# Patient Record
Sex: Male | Born: 1935 | Race: Black or African American | Hispanic: No | Marital: Married | Smoking: Former smoker
Health system: Southern US, Community
[De-identification: ages and names within clinical notes are randomized; demographics above are authoritative.]

## PROBLEM LIST (undated history)

## (undated) DIAGNOSIS — E785 Hyperlipidemia, unspecified: Secondary | ICD-10-CM

## (undated) DIAGNOSIS — I959 Hypotension, unspecified: Secondary | ICD-10-CM

## (undated) DIAGNOSIS — G43909 Migraine, unspecified, not intractable, without status migrainosus: Secondary | ICD-10-CM

## (undated) DIAGNOSIS — K8689 Other specified diseases of pancreas: Secondary | ICD-10-CM

## (undated) DIAGNOSIS — N289 Disorder of kidney and ureter, unspecified: Secondary | ICD-10-CM

## (undated) DIAGNOSIS — E119 Type 2 diabetes mellitus without complications: Secondary | ICD-10-CM

## (undated) DIAGNOSIS — K219 Gastro-esophageal reflux disease without esophagitis: Secondary | ICD-10-CM

## (undated) DIAGNOSIS — I2510 Atherosclerotic heart disease of native coronary artery without angina pectoris: Secondary | ICD-10-CM

## (undated) DIAGNOSIS — I447 Left bundle-branch block, unspecified: Secondary | ICD-10-CM

## (undated) DIAGNOSIS — Z992 Dependence on renal dialysis: Secondary | ICD-10-CM

## (undated) DIAGNOSIS — I509 Heart failure, unspecified: Secondary | ICD-10-CM

## (undated) DIAGNOSIS — N186 End stage renal disease: Secondary | ICD-10-CM

## (undated) DIAGNOSIS — I219 Acute myocardial infarction, unspecified: Secondary | ICD-10-CM

## (undated) DIAGNOSIS — Z8679 Personal history of other diseases of the circulatory system: Secondary | ICD-10-CM

## (undated) DIAGNOSIS — F418 Other specified anxiety disorders: Secondary | ICD-10-CM

## (undated) DIAGNOSIS — G629 Polyneuropathy, unspecified: Secondary | ICD-10-CM

## (undated) DIAGNOSIS — D649 Anemia, unspecified: Secondary | ICD-10-CM

## (undated) DIAGNOSIS — F0390 Unspecified dementia without behavioral disturbance: Secondary | ICD-10-CM

## (undated) DIAGNOSIS — Z89619 Acquired absence of unspecified leg above knee: Secondary | ICD-10-CM

## (undated) DIAGNOSIS — K8020 Calculus of gallbladder without cholecystitis without obstruction: Secondary | ICD-10-CM

## (undated) DIAGNOSIS — M1990 Unspecified osteoarthritis, unspecified site: Secondary | ICD-10-CM

## (undated) DIAGNOSIS — I639 Cerebral infarction, unspecified: Secondary | ICD-10-CM

## (undated) DIAGNOSIS — I10 Essential (primary) hypertension: Secondary | ICD-10-CM

## (undated) HISTORY — DX: Acute myocardial infarction, unspecified: I21.9

## (undated) HISTORY — DX: Calculus of gallbladder without cholecystitis without obstruction: K80.20

## (undated) HISTORY — PX: DG AV DIALYSIS SHUNT INTRO NDL*R* OR: HXRAD812

## (undated) HISTORY — DX: Essential (primary) hypertension: I10

## (undated) HISTORY — DX: Hyperlipidemia, unspecified: E78.5

## (undated) HISTORY — DX: Unspecified osteoarthritis, unspecified site: M19.90

## (undated) HISTORY — DX: Cerebral infarction, unspecified: I63.9

---

## 1998-08-13 ENCOUNTER — Encounter: Payer: Self-pay | Admitting: Internal Medicine

## 1998-08-13 ENCOUNTER — Encounter: Payer: Self-pay | Admitting: Emergency Medicine

## 1998-08-13 ENCOUNTER — Inpatient Hospital Stay (HOSPITAL_COMMUNITY): Admission: EM | Admit: 1998-08-13 | Discharge: 1998-08-25 | Payer: Self-pay | Admitting: Emergency Medicine

## 1998-08-14 ENCOUNTER — Encounter: Payer: Self-pay | Admitting: Internal Medicine

## 1998-08-15 ENCOUNTER — Encounter: Payer: Self-pay | Admitting: Internal Medicine

## 1998-08-16 ENCOUNTER — Encounter: Payer: Self-pay | Admitting: Internal Medicine

## 1998-08-31 ENCOUNTER — Encounter: Admission: RE | Admit: 1998-08-31 | Discharge: 1998-11-29 | Payer: Self-pay | Admitting: Emergency Medicine

## 1998-12-17 ENCOUNTER — Ambulatory Visit (HOSPITAL_COMMUNITY): Admission: RE | Admit: 1998-12-17 | Discharge: 1998-12-17 | Payer: Self-pay | Admitting: Nephrology

## 1998-12-17 ENCOUNTER — Encounter: Payer: Self-pay | Admitting: Nephrology

## 1999-01-25 ENCOUNTER — Encounter: Admission: RE | Admit: 1999-01-25 | Discharge: 1999-04-25 | Payer: Self-pay | Admitting: Internal Medicine

## 1999-04-11 ENCOUNTER — Encounter (HOSPITAL_COMMUNITY): Admission: RE | Admit: 1999-04-11 | Discharge: 1999-07-10 | Payer: Self-pay | Admitting: Nephrology

## 1999-04-25 ENCOUNTER — Encounter: Admission: RE | Admit: 1999-04-25 | Discharge: 1999-07-24 | Payer: Self-pay | Admitting: Orthopedic Surgery

## 1999-09-01 ENCOUNTER — Encounter: Payer: Self-pay | Admitting: Gastroenterology

## 1999-09-01 ENCOUNTER — Ambulatory Visit (HOSPITAL_COMMUNITY): Admission: RE | Admit: 1999-09-01 | Discharge: 1999-09-01 | Payer: Self-pay | Admitting: Gastroenterology

## 1999-09-26 ENCOUNTER — Encounter: Payer: Self-pay | Admitting: Gastroenterology

## 1999-09-26 ENCOUNTER — Ambulatory Visit (HOSPITAL_COMMUNITY): Admission: RE | Admit: 1999-09-26 | Discharge: 1999-09-26 | Payer: Self-pay | Admitting: Gastroenterology

## 2000-02-21 ENCOUNTER — Encounter: Payer: Self-pay | Admitting: Ophthalmology

## 2000-02-21 ENCOUNTER — Ambulatory Visit (HOSPITAL_COMMUNITY): Admission: RE | Admit: 2000-02-21 | Discharge: 2000-02-21 | Payer: Self-pay | Admitting: Ophthalmology

## 2001-05-10 ENCOUNTER — Encounter (HOSPITAL_COMMUNITY): Admission: RE | Admit: 2001-05-10 | Discharge: 2001-08-08 | Payer: Self-pay | Admitting: Nephrology

## 2001-06-12 HISTORY — PX: SP AV DIALYSIS SHUNT ACCESS EXISTING *L*: HXRAD383

## 2001-07-22 ENCOUNTER — Encounter: Payer: Self-pay | Admitting: *Deleted

## 2001-07-22 ENCOUNTER — Ambulatory Visit (HOSPITAL_COMMUNITY): Admission: RE | Admit: 2001-07-22 | Discharge: 2001-07-22 | Payer: Self-pay | Admitting: *Deleted

## 2001-12-04 ENCOUNTER — Ambulatory Visit (HOSPITAL_COMMUNITY): Admission: RE | Admit: 2001-12-04 | Discharge: 2001-12-04 | Payer: Self-pay | Admitting: Nephrology

## 2001-12-04 ENCOUNTER — Encounter: Payer: Self-pay | Admitting: Nephrology

## 2001-12-09 ENCOUNTER — Encounter (HOSPITAL_BASED_OUTPATIENT_CLINIC_OR_DEPARTMENT_OTHER): Admission: RE | Admit: 2001-12-09 | Discharge: 2001-12-27 | Payer: Self-pay | Admitting: Internal Medicine

## 2002-03-14 ENCOUNTER — Ambulatory Visit (HOSPITAL_COMMUNITY): Admission: RE | Admit: 2002-03-14 | Discharge: 2002-03-14 | Payer: Self-pay | Admitting: Vascular Surgery

## 2002-03-24 ENCOUNTER — Encounter (HOSPITAL_BASED_OUTPATIENT_CLINIC_OR_DEPARTMENT_OTHER): Admission: RE | Admit: 2002-03-24 | Discharge: 2002-06-22 | Payer: Self-pay | Admitting: Internal Medicine

## 2002-04-30 ENCOUNTER — Ambulatory Visit (HOSPITAL_COMMUNITY): Admission: RE | Admit: 2002-04-30 | Discharge: 2002-04-30 | Payer: Self-pay | Admitting: Nephrology

## 2002-09-10 ENCOUNTER — Ambulatory Visit (HOSPITAL_COMMUNITY): Admission: RE | Admit: 2002-09-10 | Discharge: 2002-09-10 | Payer: Self-pay | Admitting: Internal Medicine

## 2002-09-10 ENCOUNTER — Encounter (INDEPENDENT_AMBULATORY_CARE_PROVIDER_SITE_OTHER): Payer: Self-pay | Admitting: *Deleted

## 2003-04-16 ENCOUNTER — Inpatient Hospital Stay (HOSPITAL_COMMUNITY): Admission: EM | Admit: 2003-04-16 | Discharge: 2003-04-18 | Payer: Self-pay | Admitting: Emergency Medicine

## 2003-05-13 ENCOUNTER — Encounter (INDEPENDENT_AMBULATORY_CARE_PROVIDER_SITE_OTHER): Payer: Self-pay | Admitting: Cardiology

## 2003-05-13 ENCOUNTER — Ambulatory Visit (HOSPITAL_COMMUNITY): Admission: RE | Admit: 2003-05-13 | Discharge: 2003-05-13 | Payer: Self-pay | Admitting: Nephrology

## 2003-11-25 ENCOUNTER — Inpatient Hospital Stay (HOSPITAL_COMMUNITY): Admission: EM | Admit: 2003-11-25 | Discharge: 2003-11-25 | Payer: Self-pay | Admitting: Emergency Medicine

## 2004-05-30 ENCOUNTER — Ambulatory Visit: Payer: Self-pay | Admitting: Cardiology

## 2004-06-09 ENCOUNTER — Emergency Department (HOSPITAL_COMMUNITY): Admission: EM | Admit: 2004-06-09 | Discharge: 2004-06-09 | Payer: Self-pay | Admitting: Emergency Medicine

## 2004-06-29 ENCOUNTER — Ambulatory Visit: Payer: Self-pay

## 2004-11-15 ENCOUNTER — Emergency Department (HOSPITAL_COMMUNITY): Admission: EM | Admit: 2004-11-15 | Discharge: 2004-11-15 | Payer: Self-pay | Admitting: Emergency Medicine

## 2004-11-28 ENCOUNTER — Encounter (HOSPITAL_BASED_OUTPATIENT_CLINIC_OR_DEPARTMENT_OTHER): Admission: RE | Admit: 2004-11-28 | Discharge: 2005-02-26 | Payer: Self-pay | Admitting: Surgery

## 2005-04-03 ENCOUNTER — Encounter (HOSPITAL_BASED_OUTPATIENT_CLINIC_OR_DEPARTMENT_OTHER): Admission: RE | Admit: 2005-04-03 | Discharge: 2005-04-14 | Payer: Self-pay | Admitting: Surgery

## 2005-06-20 ENCOUNTER — Encounter (HOSPITAL_BASED_OUTPATIENT_CLINIC_OR_DEPARTMENT_OTHER): Admission: RE | Admit: 2005-06-20 | Discharge: 2005-09-12 | Payer: Self-pay | Admitting: Surgery

## 2005-09-20 ENCOUNTER — Encounter (HOSPITAL_BASED_OUTPATIENT_CLINIC_OR_DEPARTMENT_OTHER): Admission: RE | Admit: 2005-09-20 | Discharge: 2005-12-19 | Payer: Self-pay | Admitting: Internal Medicine

## 2006-03-02 ENCOUNTER — Inpatient Hospital Stay (HOSPITAL_COMMUNITY): Admission: EM | Admit: 2006-03-02 | Discharge: 2006-03-08 | Payer: Self-pay | Admitting: Emergency Medicine

## 2006-03-02 ENCOUNTER — Encounter (INDEPENDENT_AMBULATORY_CARE_PROVIDER_SITE_OTHER): Payer: Self-pay | Admitting: *Deleted

## 2006-03-17 ENCOUNTER — Ambulatory Visit (HOSPITAL_COMMUNITY): Admission: RE | Admit: 2006-03-17 | Discharge: 2006-03-17 | Payer: Self-pay | Admitting: Vascular Surgery

## 2006-09-17 ENCOUNTER — Ambulatory Visit (HOSPITAL_COMMUNITY): Admission: RE | Admit: 2006-09-17 | Discharge: 2006-09-17 | Payer: Self-pay | Admitting: Nephrology

## 2007-01-18 ENCOUNTER — Encounter: Admission: RE | Admit: 2007-01-18 | Discharge: 2007-01-18 | Payer: Self-pay | Admitting: Nephrology

## 2007-01-22 ENCOUNTER — Encounter (HOSPITAL_BASED_OUTPATIENT_CLINIC_OR_DEPARTMENT_OTHER): Admission: RE | Admit: 2007-01-22 | Discharge: 2007-03-12 | Payer: Self-pay | Admitting: Internal Medicine

## 2007-02-05 ENCOUNTER — Inpatient Hospital Stay (HOSPITAL_COMMUNITY): Admission: AD | Admit: 2007-02-05 | Discharge: 2007-02-08 | Payer: Self-pay | Admitting: Nephrology

## 2007-02-05 ENCOUNTER — Encounter (INDEPENDENT_AMBULATORY_CARE_PROVIDER_SITE_OTHER): Payer: Self-pay | Admitting: Nephrology

## 2007-02-06 ENCOUNTER — Ambulatory Visit: Payer: Self-pay | Admitting: Surgery

## 2007-03-01 ENCOUNTER — Ambulatory Visit: Payer: Self-pay | Admitting: Vascular Surgery

## 2007-03-11 ENCOUNTER — Ambulatory Visit (HOSPITAL_COMMUNITY): Admission: RE | Admit: 2007-03-11 | Discharge: 2007-03-11 | Payer: Self-pay | Admitting: Vascular Surgery

## 2007-03-11 ENCOUNTER — Ambulatory Visit: Payer: Self-pay | Admitting: Vascular Surgery

## 2007-03-15 ENCOUNTER — Encounter (HOSPITAL_BASED_OUTPATIENT_CLINIC_OR_DEPARTMENT_OTHER): Admission: RE | Admit: 2007-03-15 | Discharge: 2007-06-13 | Payer: Self-pay | Admitting: Surgery

## 2007-07-16 ENCOUNTER — Encounter (HOSPITAL_BASED_OUTPATIENT_CLINIC_OR_DEPARTMENT_OTHER): Admission: RE | Admit: 2007-07-16 | Discharge: 2007-10-14 | Payer: Self-pay | Admitting: Internal Medicine

## 2007-10-07 ENCOUNTER — Ambulatory Visit: Payer: Self-pay | Admitting: Cardiology

## 2007-10-11 ENCOUNTER — Ambulatory Visit: Payer: Self-pay

## 2007-10-21 ENCOUNTER — Encounter (HOSPITAL_BASED_OUTPATIENT_CLINIC_OR_DEPARTMENT_OTHER): Admission: RE | Admit: 2007-10-21 | Discharge: 2008-01-16 | Payer: Self-pay | Admitting: Surgery

## 2008-01-17 ENCOUNTER — Encounter (HOSPITAL_BASED_OUTPATIENT_CLINIC_OR_DEPARTMENT_OTHER): Admission: RE | Admit: 2008-01-17 | Discharge: 2008-03-10 | Payer: Self-pay | Admitting: Internal Medicine

## 2008-03-10 ENCOUNTER — Encounter (HOSPITAL_BASED_OUTPATIENT_CLINIC_OR_DEPARTMENT_OTHER): Admission: RE | Admit: 2008-03-10 | Discharge: 2008-06-02 | Payer: Self-pay | Admitting: Internal Medicine

## 2008-06-18 ENCOUNTER — Encounter (HOSPITAL_BASED_OUTPATIENT_CLINIC_OR_DEPARTMENT_OTHER): Admission: RE | Admit: 2008-06-18 | Discharge: 2008-09-16 | Payer: Self-pay | Admitting: Internal Medicine

## 2008-06-19 ENCOUNTER — Encounter (INDEPENDENT_AMBULATORY_CARE_PROVIDER_SITE_OTHER): Payer: Self-pay | Admitting: General Surgery

## 2008-06-19 ENCOUNTER — Inpatient Hospital Stay (HOSPITAL_COMMUNITY): Admission: RE | Admit: 2008-06-19 | Discharge: 2008-06-23 | Payer: Self-pay | Admitting: General Surgery

## 2008-09-08 ENCOUNTER — Ambulatory Visit (HOSPITAL_COMMUNITY): Admission: RE | Admit: 2008-09-08 | Discharge: 2008-09-08 | Payer: Self-pay | Admitting: Nephrology

## 2008-09-16 ENCOUNTER — Encounter (HOSPITAL_BASED_OUTPATIENT_CLINIC_OR_DEPARTMENT_OTHER): Admission: RE | Admit: 2008-09-16 | Discharge: 2008-11-13 | Payer: Self-pay | Admitting: Internal Medicine

## 2009-01-15 ENCOUNTER — Ambulatory Visit: Payer: Self-pay | Admitting: Internal Medicine

## 2009-01-15 ENCOUNTER — Encounter: Payer: Self-pay | Admitting: Internal Medicine

## 2009-01-15 DIAGNOSIS — E785 Hyperlipidemia, unspecified: Secondary | ICD-10-CM

## 2009-01-15 DIAGNOSIS — Z8679 Personal history of other diseases of the circulatory system: Secondary | ICD-10-CM

## 2009-01-15 DIAGNOSIS — F411 Generalized anxiety disorder: Secondary | ICD-10-CM

## 2009-01-15 DIAGNOSIS — N186 End stage renal disease: Secondary | ICD-10-CM

## 2009-01-15 DIAGNOSIS — Z992 Dependence on renal dialysis: Secondary | ICD-10-CM

## 2009-01-15 DIAGNOSIS — I219 Acute myocardial infarction, unspecified: Secondary | ICD-10-CM | POA: Insufficient documentation

## 2009-01-15 DIAGNOSIS — R05 Cough: Secondary | ICD-10-CM

## 2009-01-15 DIAGNOSIS — I10 Essential (primary) hypertension: Secondary | ICD-10-CM | POA: Insufficient documentation

## 2009-01-18 ENCOUNTER — Ambulatory Visit: Payer: Self-pay | Admitting: Cardiology

## 2009-01-29 ENCOUNTER — Ambulatory Visit: Payer: Self-pay | Admitting: Internal Medicine

## 2009-01-29 DIAGNOSIS — R519 Headache, unspecified: Secondary | ICD-10-CM | POA: Insufficient documentation

## 2009-01-29 DIAGNOSIS — R51 Headache: Secondary | ICD-10-CM

## 2009-03-24 ENCOUNTER — Encounter (HOSPITAL_BASED_OUTPATIENT_CLINIC_OR_DEPARTMENT_OTHER): Admission: RE | Admit: 2009-03-24 | Discharge: 2009-06-07 | Payer: Self-pay | Admitting: General Surgery

## 2009-04-01 ENCOUNTER — Ambulatory Visit: Admission: RE | Admit: 2009-04-01 | Discharge: 2009-04-01 | Payer: Self-pay | Admitting: Internal Medicine

## 2009-04-01 ENCOUNTER — Ambulatory Visit: Payer: Self-pay | Admitting: Surgery

## 2009-04-01 ENCOUNTER — Encounter (HOSPITAL_BASED_OUTPATIENT_CLINIC_OR_DEPARTMENT_OTHER): Payer: Self-pay | Admitting: Internal Medicine

## 2009-06-12 HISTORY — PX: PARATHYROIDECTOMY: SHX19

## 2009-06-12 HISTORY — PX: COLON SURGERY: SHX602

## 2009-06-27 ENCOUNTER — Inpatient Hospital Stay (HOSPITAL_COMMUNITY): Admission: EM | Admit: 2009-06-27 | Discharge: 2009-07-03 | Payer: Self-pay | Admitting: Interventional Cardiology

## 2009-06-27 ENCOUNTER — Encounter: Payer: Self-pay | Admitting: Emergency Medicine

## 2009-06-30 ENCOUNTER — Ambulatory Visit: Payer: Self-pay | Admitting: Vascular Surgery

## 2009-09-08 ENCOUNTER — Ambulatory Visit: Payer: Self-pay | Admitting: Vascular Surgery

## 2009-09-30 ENCOUNTER — Ambulatory Visit: Payer: Self-pay | Admitting: Vascular Surgery

## 2009-10-07 ENCOUNTER — Ambulatory Visit: Payer: Self-pay | Admitting: Vascular Surgery

## 2009-10-29 ENCOUNTER — Emergency Department (HOSPITAL_COMMUNITY): Admission: EM | Admit: 2009-10-29 | Discharge: 2009-10-29 | Payer: Self-pay | Admitting: Family Medicine

## 2010-01-20 ENCOUNTER — Ambulatory Visit: Payer: Self-pay | Admitting: Vascular Surgery

## 2010-03-03 ENCOUNTER — Encounter (HOSPITAL_BASED_OUTPATIENT_CLINIC_OR_DEPARTMENT_OTHER)
Admission: RE | Admit: 2010-03-03 | Discharge: 2010-06-01 | Payer: Self-pay | Source: Home / Self Care | Attending: General Surgery | Admitting: General Surgery

## 2010-07-03 ENCOUNTER — Encounter: Payer: Self-pay | Admitting: General Surgery

## 2010-07-03 ENCOUNTER — Encounter: Payer: Self-pay | Admitting: Nephrology

## 2010-08-28 LAB — CBC
HCT: 32.3 % — ABNORMAL LOW (ref 39.0–52.0)
HCT: 34.1 % — ABNORMAL LOW (ref 39.0–52.0)
HCT: 36.7 % — ABNORMAL LOW (ref 39.0–52.0)
Hemoglobin: 11.3 g/dL — ABNORMAL LOW (ref 13.0–17.0)
Hemoglobin: 12.4 g/dL — ABNORMAL LOW (ref 13.0–17.0)
MCHC: 33.7 g/dL (ref 30.0–36.0)
MCHC: 33.8 g/dL (ref 30.0–36.0)
MCHC: 32.8 g/dL (ref 30.0–36.0)
MCV: 85.5 fL (ref 78.0–100.0)
MCV: 86.1 fL (ref 78.0–100.0)
MCV: 86.8 fL (ref 78.0–100.0)
Platelets: 175 10*3/uL (ref 150–400)
Platelets: 187 10*3/uL (ref 150–400)
RBC: 3.22 MIL/uL — ABNORMAL LOW (ref 4.22–5.81)
RBC: 3.98 MIL/uL — ABNORMAL LOW (ref 4.22–5.81)
RBC: 4.47 MIL/uL (ref 4.22–5.81)
RDW: 16.8 % — ABNORMAL HIGH (ref 11.5–15.5)
RDW: 17 % — ABNORMAL HIGH (ref 11.5–15.5)
RDW: 17.1 % — ABNORMAL HIGH (ref 11.5–15.5)
WBC: 4.4 10*3/uL (ref 4.0–10.5)
WBC: 4.9 10*3/uL (ref 4.0–10.5)
WBC: 4.9 10*3/uL (ref 4.0–10.5)
WBC: 5.9 10*3/uL (ref 4.0–10.5)

## 2010-08-28 LAB — COMPREHENSIVE METABOLIC PANEL
ALT: 18 U/L (ref 0–53)
AST: 18 U/L (ref 0–37)
Albumin: 2.9 g/dL — ABNORMAL LOW (ref 3.5–5.2)
Alkaline Phosphatase: 62 U/L (ref 39–117)
BUN: 98 mg/dL — ABNORMAL HIGH (ref 6–23)
CO2: 26 mEq/L (ref 19–32)
Calcium: 8.8 mg/dL (ref 8.4–10.5)
Chloride: 94 mEq/L — ABNORMAL LOW (ref 96–112)
GFR calc Af Amer: 6 mL/min — ABNORMAL LOW (ref >60)
Glucose, Bld: 95 mg/dL (ref 70–99)
Potassium: 6.1 mEq/L — ABNORMAL HIGH (ref 3.5–5.1)
Potassium: 5.4 mEq/L — ABNORMAL HIGH (ref 3.5–5.1)
Sodium: 135 mEq/L (ref 135–145)
Total Bilirubin: 0.8 mg/dL (ref 0.3–1.2)
Total Protein: 7.7 g/dL (ref 6.0–8.3)

## 2010-08-28 LAB — BASIC METABOLIC PANEL
BUN: 45 mg/dL — ABNORMAL HIGH (ref 6–23)
CO2: 23 mEq/L (ref 19–32)
CO2: 32 mEq/L (ref 19–32)
Calcium: 8.1 mg/dL — ABNORMAL LOW (ref 8.4–10.5)
Calcium: 8.2 mg/dL — ABNORMAL LOW (ref 8.4–10.5)
Calcium: 8.5 mg/dL (ref 8.4–10.5)
Creatinine, Ser: 6.94 mg/dL — ABNORMAL HIGH (ref 0.4–1.5)
Creatinine, Ser: 9.76 mg/dL — ABNORMAL HIGH (ref 0.4–1.5)
GFR calc Af Amer: 10 mL/min — ABNORMAL LOW (ref >60)
GFR calc Af Amer: 5 mL/min — ABNORMAL LOW (ref >60)
GFR calc non Af Amer: 5 mL/min — ABNORMAL LOW (ref >60)
GFR calc non Af Amer: 5 mL/min — ABNORMAL LOW (ref >60)
GFR calc non Af Amer: 8 mL/min — ABNORMAL LOW (ref >60)
Glucose, Bld: 142 mg/dL — ABNORMAL HIGH (ref 70–99)
Potassium: 4.5 mEq/L (ref 3.5–5.1)
Potassium: 5.2 mEq/L — ABNORMAL HIGH (ref 3.5–5.1)
Sodium: 133 mEq/L — ABNORMAL LOW (ref 135–145)

## 2010-08-28 LAB — RENAL FUNCTION PANEL
Albumin: 3 g/dL — ABNORMAL LOW (ref 3.5–5.2)
Albumin: 3.2 g/dL — ABNORMAL LOW (ref 3.5–5.2)
BUN: 30 mg/dL — ABNORMAL HIGH (ref 6–23)
BUN: 54 mg/dL — ABNORMAL HIGH (ref 6–23)
Calcium: 8.1 mg/dL — ABNORMAL LOW (ref 8.4–10.5)
Calcium: 8.4 mg/dL (ref 8.4–10.5)
Creatinine, Ser: 7.07 mg/dL — ABNORMAL HIGH (ref 0.4–1.5)
GFR calc Af Amer: 9 mL/min — ABNORMAL LOW (ref >60)
GFR calc non Af Amer: 8 mL/min — ABNORMAL LOW (ref >60)
Phosphorus: 5.7 mg/dL — ABNORMAL HIGH (ref 2.3–4.6)
Phosphorus: 6.9 mg/dL — ABNORMAL HIGH (ref 2.3–4.6)
Potassium: 4.9 mEq/L (ref 3.5–5.1)

## 2010-08-28 LAB — DIFFERENTIAL
Eosinophils Absolute: 0 10*3/uL (ref 0.0–0.7)
Eosinophils Relative: 1 % (ref 0–5)
Lymphs Abs: 0.9 10*3/uL (ref 0.7–4.0)
Monocytes Relative: 12 % (ref 3–12)

## 2010-08-28 LAB — CK TOTAL AND CKMB (NOT AT ARMC)
CK, MB: 8.8 ng/mL (ref 0.3–4.0)
CK, MB: 6.4 ng/mL (ref 0.3–4.0)
Relative Index: 3.3 — ABNORMAL HIGH (ref 0.0–2.5)
Relative Index: 3.4 — ABNORMAL HIGH (ref 0.0–2.5)
Relative Index: 2.4 (ref 0.0–2.5)

## 2010-08-28 LAB — TROPONIN I
Troponin I: 0.86 ng/mL (ref 0.00–0.06)
Troponin I: 0.12 ng/mL — ABNORMAL HIGH (ref 0.00–0.06)

## 2010-08-28 LAB — LIPID PANEL: Triglycerides: 39 mg/dL (ref <150)

## 2010-08-28 LAB — GLUCOSE, CAPILLARY: Glucose-Capillary: 85 mg/dL (ref 70–99)

## 2010-08-28 LAB — ALBUMIN: Albumin: 2.7 g/dL — ABNORMAL LOW (ref 3.5–5.2)

## 2010-08-28 LAB — HEPATITIS B SURFACE ANTIGEN: Hepatitis B Surface Ag: NEGATIVE

## 2010-08-28 LAB — APTT: aPTT: 33 seconds (ref 24–37)

## 2010-08-28 LAB — PHOSPHORUS: Phosphorus: 9.8 mg/dL (ref 2.3–4.6)

## 2010-09-15 LAB — GLUCOSE, CAPILLARY: Glucose-Capillary: 129 mg/dL — ABNORMAL HIGH (ref 70–99)

## 2010-09-16 ENCOUNTER — Other Ambulatory Visit (HOSPITAL_COMMUNITY): Payer: Self-pay | Admitting: Nephrology

## 2010-09-16 DIAGNOSIS — Q892 Congenital malformations of other endocrine glands: Secondary | ICD-10-CM

## 2010-09-22 LAB — GLUCOSE, CAPILLARY
Glucose-Capillary: 170 mg/dL — ABNORMAL HIGH (ref 70–99)
Glucose-Capillary: 90 mg/dL (ref 70–99)

## 2010-09-26 LAB — GLUCOSE, CAPILLARY
Glucose-Capillary: 104 mg/dL — ABNORMAL HIGH (ref 70–99)
Glucose-Capillary: 106 mg/dL — ABNORMAL HIGH (ref 70–99)
Glucose-Capillary: 106 mg/dL — ABNORMAL HIGH (ref 70–99)
Glucose-Capillary: 127 mg/dL — ABNORMAL HIGH (ref 70–99)
Glucose-Capillary: 127 mg/dL — ABNORMAL HIGH (ref 70–99)
Glucose-Capillary: 143 mg/dL — ABNORMAL HIGH (ref 70–99)
Glucose-Capillary: 173 mg/dL — ABNORMAL HIGH (ref 70–99)
Glucose-Capillary: 93 mg/dL (ref 70–99)
Glucose-Capillary: 97 mg/dL (ref 70–99)

## 2010-09-26 LAB — RENAL FUNCTION PANEL
CO2: 24 mEq/L (ref 19–32)
Calcium: 8 mg/dL — ABNORMAL LOW (ref 8.4–10.5)
Chloride: 91 mEq/L — ABNORMAL LOW (ref 96–112)
GFR calc Af Amer: 6 mL/min — ABNORMAL LOW (ref >60)
GFR calc non Af Amer: 5 mL/min — ABNORMAL LOW (ref >60)
Glucose, Bld: 92 mg/dL (ref 70–99)
Potassium: 5.1 mEq/L (ref 3.5–5.1)
Sodium: 128 mEq/L — ABNORMAL LOW (ref 135–145)

## 2010-09-26 LAB — COMPREHENSIVE METABOLIC PANEL
Alkaline Phosphatase: 277 U/L — ABNORMAL HIGH (ref 39–117)
BUN: 39 mg/dL — ABNORMAL HIGH (ref 6–23)
Glucose, Bld: 103 mg/dL — ABNORMAL HIGH (ref 70–99)
Potassium: 4.8 mEq/L (ref 3.5–5.1)
Total Protein: 8 g/dL (ref 6.0–8.3)

## 2010-09-26 LAB — DIFFERENTIAL
Basophils Absolute: 0 10*3/uL (ref 0.0–0.1)
Basophils Relative: 0 % (ref 0–1)
Monocytes Relative: 15 % — ABNORMAL HIGH (ref 3–12)
Neutro Abs: 3.4 10*3/uL (ref 1.7–7.7)
Neutrophils Relative %: 60 % (ref 43–77)

## 2010-09-26 LAB — CALCIUM
Calcium: 7.4 mg/dL — ABNORMAL LOW (ref 8.4–10.5)
Calcium: 7.4 mg/dL — ABNORMAL LOW (ref 8.4–10.5)
Calcium: 7.5 mg/dL — ABNORMAL LOW (ref 8.4–10.5)
Calcium: 7.7 mg/dL — ABNORMAL LOW (ref 8.4–10.5)
Calcium: 8 mg/dL — ABNORMAL LOW (ref 8.4–10.5)
Calcium: 9.4 mg/dL (ref 8.4–10.5)

## 2010-09-26 LAB — BASIC METABOLIC PANEL
BUN: 37 mg/dL — ABNORMAL HIGH (ref 6–23)
BUN: 48 mg/dL — ABNORMAL HIGH (ref 6–23)
CO2: 24 mEq/L (ref 19–32)
Calcium: 7.6 mg/dL — ABNORMAL LOW (ref 8.4–10.5)
Chloride: 93 mEq/L — ABNORMAL LOW (ref 96–112)
Chloride: 96 mEq/L (ref 96–112)
GFR calc non Af Amer: 7 mL/min — ABNORMAL LOW (ref >60)
Glucose, Bld: 124 mg/dL — ABNORMAL HIGH (ref 70–99)
Glucose, Bld: 143 mg/dL — ABNORMAL HIGH (ref 70–99)
Glucose, Bld: 145 mg/dL — ABNORMAL HIGH (ref 70–99)
Potassium: 4.6 mEq/L (ref 3.5–5.1)
Potassium: 5.2 mEq/L — ABNORMAL HIGH (ref 3.5–5.1)
Potassium: 6.2 mEq/L — ABNORMAL HIGH (ref 3.5–5.1)
Sodium: 134 mEq/L — ABNORMAL LOW (ref 135–145)

## 2010-09-26 LAB — CBC
HCT: 37.4 % — ABNORMAL LOW (ref 39.0–52.0)
HCT: 42.4 % (ref 39.0–52.0)
Hemoglobin: 11.3 g/dL — ABNORMAL LOW (ref 13.0–17.0)
Hemoglobin: 12.1 g/dL — ABNORMAL LOW (ref 13.0–17.0)
Hemoglobin: 13.7 g/dL (ref 13.0–17.0)
MCHC: 32.2 g/dL (ref 30.0–36.0)
MCHC: 32.3 g/dL (ref 30.0–36.0)
MCV: 83.4 fL (ref 78.0–100.0)
MCV: 83.7 fL (ref 78.0–100.0)
Platelets: 192 10*3/uL (ref 150–400)
RBC: 4.18 MIL/uL — ABNORMAL LOW (ref 4.22–5.81)
RDW: 18.2 % — ABNORMAL HIGH (ref 11.5–15.5)
RDW: 18.4 % — ABNORMAL HIGH (ref 11.5–15.5)
WBC: 5.6 10*3/uL (ref 4.0–10.5)

## 2010-09-26 LAB — PHOSPHORUS
Phosphorus: 3.6 mg/dL (ref 2.3–4.6)
Phosphorus: 3.9 mg/dL (ref 2.3–4.6)
Phosphorus: 4.2 mg/dL (ref 2.3–4.6)
Phosphorus: 4.4 mg/dL (ref 2.3–4.6)

## 2010-09-26 LAB — POTASSIUM: Potassium: 4.9 mEq/L (ref 3.5–5.1)

## 2010-09-27 ENCOUNTER — Encounter (HOSPITAL_COMMUNITY)
Admission: RE | Admit: 2010-09-27 | Discharge: 2010-09-27 | Disposition: A | Discharge status: Home / Self Care | Payer: Medicare Other | Source: Ambulatory Visit | Attending: Nephrology | Admitting: Nephrology

## 2010-09-27 ENCOUNTER — Encounter (HOSPITAL_COMMUNITY): Payer: Self-pay

## 2010-09-27 ENCOUNTER — Ambulatory Visit (HOSPITAL_COMMUNITY): Payer: Self-pay

## 2010-09-27 DIAGNOSIS — Q892 Congenital malformations of other endocrine glands: Secondary | ICD-10-CM

## 2010-09-27 DIAGNOSIS — E213 Hyperparathyroidism, unspecified: Secondary | ICD-10-CM | POA: Insufficient documentation

## 2010-09-27 DIAGNOSIS — N189 Chronic kidney disease, unspecified: Secondary | ICD-10-CM | POA: Insufficient documentation

## 2010-09-27 MED ORDER — TECHNETIUM TC 99M SESTAMIBI - CARDIOLITE
25.0000 | Freq: Once | INTRAVENOUS | Status: AC | PRN
  Administered 2010-09-27: 10:00:00 25 via INTRAVENOUS

## 2010-10-25 NOTE — Group Therapy Note (Signed)
NAME:  Sean [name], PULEO NO.:  [id number]   MEDICAL RECORD NO.:  [id number]           PATIENT TYPE:   LOCATION:                                 FACILITY:   PHYSICIAN:  Lenon Curt. Chilton Si, M.D.  DATE OF BIRTH:  Nov 08, 1935                                 PROGRESS NOTE   HISTORY:  A 75 year old male returning for recheck of venous stasis ulcer of the  left lower anterior leg and also with a small ulceration of the right  anterior lower leg.  These wounds are doing better.  There is some  tenderness and discomfort.  That is scabbed over at the present time on  multiple scars of the lower legs.   PHYSICAL EXAMINATION:  Temperature 97.7, pulse 99, respirations 16, blood pressure 103/69.  Last capillary glucose 163 mg%.   Wound measurements:  Wound #10, left anterior lower extremity, now 0.5 x 0.8 x approximately  0.1 cm depth.  Wound #13, right anterior upper extremity, now 0.6 x 0.8 x 0.1 cm.  Both  wounds are scabbed over with a tenacious scab.  There is healing from  the edge.  There is tenderness on palpation.  There is no purulence and  no surrounding erythema suggesting infection.   TREATMENT:  Wounds have progressed to a point now that I do not think the Profore  dressing is necessary any more in the left leg, and we have applied  Polysporin and bandages to both wounds.  Because of the tenacious scab  that is present, I felt that he should return to clinic for 1 more  visit.  He is to call if the wounds appear to worsen, otherwise, return  to clinic in 3 weeks.  He is advised that he may bathe.   ICD-9 code, 454.0.  CPT code, 16109.      Lenon Curt Chilton Si, M.D.  Electronically Signed     AGG/MEDQ  D:  05/29/2008  T:  05/29/2008  Job:  [id number]

## 2010-10-25 NOTE — Op Note (Signed)
NAME:  Sean [name], Sean [name]             ACCOUNT NO.:  [id number]   MEDICAL RECORD NO.:  [id number]          PATIENT TYPE:  AMB   LOCATION:  SDS                          FACILITY:  [facility]   PHYSICIAN:  Larina Earthly, M.D.    DATE OF BIRTH:  08/20/1935   DATE OF PROCEDURE:  03/11/2007  DATE OF DISCHARGE:                               OPERATIVE REPORT   PREOPERATIVE DIAGNOSIS:  End-stage renal disease with aneurysmal  degeneration of venous limb of left forearm loop AV Gore-Tex graft.   POSTOPERATIVE DIAGNOSIS:  End-stage renal disease with aneurysmal  degeneration of venous limb of left forearm loop AV Gore-Tex graft.   PROCEDURES:  Replacement of venous limb of left forearm loop AV Gore-Tex  graft.   SURGEON:  Larina Earthly, M.D.   ASSISTANT:  Nurse.   ANESTHESIA:  MAC.   COMPLICATIONS:  None.   DISPOSITION:  To recovery room stable.   PROCEDURE IN DETAIL:  The patient was taken to the operating room and  placed in supine position where the left arm was prepped and draped in  usual sterile fashion.  Incision was made using local anesthesia over  the distal loop of the graft, and the graft was exposed in this area.  A  separate incision was made using local anesthesia at the antecubital  space over the venous limb of the anastomosis.  The graft was isolated  at this area as well.  A tunnel was created between the distal loop of  the graft and the antecubital space.  The patient was given 5000 units  of intravenous heparin.  After adequate circulation time, the graft was  occluded at the antecubital space and also at the distal loop of the  graft.  The graft was divided at the distal loop, and the new 6-mm  standard wall graft was sewn end-to-end to the graft at this position.  The graft was flushed with heparinized saline and reoccluded.  Next, the  graft was brought into approximation with the graft near the venous  anastomosis at the antecubital space, and the graft was divided  at this  level as well.  The graft cut to appropriate length and sewn end-to-end  to the old graft with a running 6-0 Prolene suture.  Clamps were  removed, and a thrill was noted.  The wound were irrigated with saline.  Hemostasis achieved with cautery.  Wounds were closed with 3-0 Vicryl in  the subcutaneous and subcuticular tissue.  Benzoin and Steri-Strips were  applied.      Larina Earthly, M.D.  Electronically Signed    TFE/MEDQ  D:  03/11/2007  T:  03/11/2007  Job:  [id number]

## 2010-10-25 NOTE — Assessment & Plan Note (Signed)
[facility]   NAME:  [name], [name]             ACCOUNT NO.:  [id number]   MEDICAL RECORD NO.:  [id number]      DATE OF BIRTH:  [birth year]   PHYSICIAN:  Jake Shark A. Tanda Rockers, M.D. VISIT DATE:  04/29/2007                                   OFFICE VISIT   SUBJECTIVE:  Sean [name] is a 75 year old man whom we are following  for calciphylaxis.  In the interim he has continued to use antiseptic  soap wash daily, Iodosorb, and a dry dressing.  He reports that there is  been no increase in pain, drainage, or ulceration.  He continues to be  ambulatory.  He is tolerating his dialysis treatments without  difficulty.   OBJECTIVE:  Blood pressure is 149/89, respirations 18, pulse rate 83,  temperature is 97.9.  Capillary blood glucose is 129 mg percent.  Inspection of the left posterior leg shows that the wounds continue to  contract.  There is healthy, well-adherent eschar over a minuscule area.  There is no malodor.  There is no evidence of active infection or  drainage.   IMPRESSION:  Resolved calciphylaxis.   PLAN:  We are discharging the patient.  We have instructed him to  continue the topical application of the Iodosorb on a p.r.n. basis.  We  have expressed a willingness to reevaluate him in the presence of a  recurrence.  The patient seems to understand.  He expresses gratitude  for having been seen in the clinic and indicates that he will be  compliant.      Harold A. Tanda Rockers, M.D.  Electronically Signed     HAN/MEDQ  D:  04/29/2007  T:  04/29/2007  Job:  [id number]   cc:   Terrial Rhodes, M.D.

## 2010-10-25 NOTE — Assessment & Plan Note (Signed)
[facility]   NAME:  Sean [name], Sean [name]             ACCOUNT NO.:  [id number]   MEDICAL RECORD NO.:  [id number]      DATE OF BIRTH:  1935/12/01   PHYSICIAN:  Jonelle Sports. Sevier, M.D.  VISIT DATE:  12/04/2007                                   OFFICE VISIT   HISTORY:  This 75 year old black male, chronic dialysis patient has been  plagued with calciphylaxis and painful lesions on the lower extremities  for some time.  The right lower extremity has healed but he has had  persistent open ulcerations and full-thickness wounds on the left lower  extremity.  These were treated with application of Iodosorb and the use  of a compressive wrap with some success initially, but recently the  patient has noted that the wraps themselves of generated pain and he has  been in the habit of taking them off before due for his return visit.  He admittedly since his last visit here, when he was placed in a Profore  wrap, he removed it within 48 hours after this visit.   He reports that his overall pain is somewhat better in that extremity  now probably at a 2-3 level on a scale of 10 and as opposed to previous  level of 8 or 9.  He reports no fever, chills, or other symptoms of that  nature, is unaware of any drainage and any odor from his lesions.   EXAMINATION:  Blood pressure is 132/69, pulse 91, respirations 18, and  temperature 99 degrees.  The wounds on the left lower extremity are  primarily anteromedial, anterior, and anterolateral and are nicely  summarized by the nurse on the wound sheet.  He has approximately 65 to  70 square centimeter of wound area and most of wounds now covered with a  semi-necrotic eschar.  He has no active drainage, no spreading  inflammation.   IMPRESSION:  Satisfactory course in place of dialysis related  calciphylaxis with ulcerations.   DISPOSITION:  No debridement is attempted today, all of these wounds are  largely covered with eschar.  Everything  is clean and seems little  advantage in changing that at this point.   The patient will be today dressed with an application of triamcinolone  ointment on simply the wound itself, not to adjacent more normal skin.  It will then be covered with a bulky wrap and this will be held in place  with a stockinette wrap.   The patient will return weekly to the nurse for dressing changes and 3-4  weeks to the physician for reevaluation.           ______________________________  Jonelle Sports Cheryll Cockayne, M.D.     RES/MEDQ  D:  12/04/2007  T:  12/05/2007  Job:  [id number]

## 2010-10-25 NOTE — Discharge Summary (Signed)
NAME:  Sean [name], Sean [name] NO.:  [id number]   MEDICAL RECORD NO.:  [id number]          PATIENT TYPE:  INP   LOCATION:  5528                         FACILITY:  [facility]   PHYSICIAN:  Rufina Falco, M.D.     DATE OF BIRTH:  Jan 29, 1936   DATE OF ADMISSION:  02/05/2007  DATE OF DISCHARGE:  02/08/2007                               DISCHARGE SUMMARY   DISCHARGE DIAGNOSES:  1. End-stage renal disease.  2. Fever.  3. Left lower extremity calciphylaxis.  4. Diabetes mellitus.  5. Abdominal pain.  6. Anxiety.  7. Hyperlipidemia.   DISCHARGE MEDICATIONS:  1. Fortaz 1 g IV with each hemodialysis x2 weeks.  2. Vancomycin 1 g IV with each hemodialysis for two weeks.  3. Fosrenol 500 mg four tablets with each meal.  4. Loratadine 10 mg p.o. daily.  5. Nepro one can t.i.d.  6. Paxil 20 mg p.o. daily.  7. Actos 15 mg p.o. daily.  8. Nephro-Vite one pill p.o. daily.  9. Xanax 0.25 mg p.o. q.8 h.  10.Vicodin 5/500 mg 1-2 pills p.o. q.6 h. p.r.n. pain.  11.Phenergan 25 mg p.o. q.8 h. p.r.n. nausea.  12.Ambien 5 mg p.o. q.h.s. p.r.n. insomnia.  13.The patient is just stopping Zemplar.   DISPOSITION/FOLLOWUP:  The patient is to continue his hemodialysis  schedule and to follow up with the hemodialysis doctors.   PROCEDURES PERFORMED:  1. Patient had a chest x-ray on February 05, 2007 which showed      suboptimal inspiration, stable cardiomegaly, atelectasis and/or      pneumonia in the lung bases.  No evidence of pulmonary edema.  The      patient also had x-ray of the abdomen on February 05, 2007 which      showed multiple radiopaque structures in the right upper quadrant      of the abdomen.  These could be related to the gallbladder,      although the distribution suggests that they may be __________      structures within the bowel.  This could be better evaluated with      CT.  2. Nonspecific bowel gas pattern.  The patient had a chest x-ray on      February 06, 2007.   IMPRESSION:  1. Cardiomegaly with pulmonary vascular prominence similar in      appearance to prior exam.  2. Tortuous aorta.  CT of the abdomen on February 06, 2007.   IMPRESSION:  1. High density material within his stomach and ascending colon.  It      is compatible with ingested tablets.  Likely, the patient is      __________ .  2. Atrophic kidneys bilaterally compatible with end-stage renal      disease.  3. Atherosclerosis.  The patient had a chest x-ray on February 07, 2007      which showed:      a.     Cardiomegaly with mild pulmonary venous congestion.      b.     Bibasilar atelectasis.  The patient had a CT of the left  lower extremity on February 07, 2007.   IMPRESSION:  1. Faintly reduced intramuscular fat stranding in the distal      __________ musculature or in the tibialis anterior.  I cannot      exclude intramuscular edema or myositis.  However, no discrete      findings of abscess are identified, and there is no gas in the soft      tissues.  MRI is more sensitive for assessing for intramuscular      edema if clinically warranted.  2. Venous varices are present immediately in the leg.  3. Atherosclerotic calcification is present.  Of note the patient      refused to have MRI evaluation of the lower extremities to rule out      osteomyelitis and abscess.  The patient also had arterial Dopplers      bilaterally.  ABI could not be ascertained  due to incompressible      vesicles probably secondary to calcification.  However, Dopplers      were normal.  Triphasic throughout the pedal vessels.  This was      done on February 06, 2007.   CONSULTATIONS:  Wound care consult.  Brief H&P is as follows.   CHIEF COMPLAINT:  Fever.   HISTORY OF PRESENT ILLNESS:  The patient is a 75 year old African-  American man with past medical history significant for ESRD with  hemodialysis on Tuesdays, Thursdays, and Saturdays at [facility], hypertension, diabetes  mellitus, calciphylaxis,  hypertension, and gout, who [facility] to having  fevers during hemodialysis.  The patient reports having fever, decreased  p.o. intake, and abdominal tightness x2-3 days.  The patient describes  abdomen as bloated and tight.  He complains of diarrhea on Sunday with 4-  5 episodes of not bloody or dark stools.  The patient also complained of  cough and white sputum x1 week.  He reported left lower extremity pain  secondary to his calciphylaxis. Wound care has been following.  The  patient also reported wound care was following him for his left big toe  which appears darker now, but it is not painful.   For further information on Past Medical History, Allergies, Past  Surgical History, Medications, Family History, Social History, please  see hospital chart.   PHYSICAL EXAMINATION:  VITALS:  Pulse 128, blood pressure 135/64, O2  sats 89% on room air, temperature 103.1.  GENERAL:  In no acute distress.  HEENT:  AT, [place], moist oral mucosa.  CV:  S1, S2, tachycardic, regular.  No murmurs noted.  LUNGS:  CTA bilaterally, slight decrease in breath sounds in the bases.  ABDOMEN:  Soft, obese, decreased breath sounds, nontender, no guarding,  no rebound.  NEUROLOGICAL:  Nonfocal, alert.  EXTREMITIES:  Bilateral lower extremity edema, positive calciphylaxis,  decreased pulses in the left lower extremity, left first digit with  black nail, question necrosis.  SKIN:  Calciphylaxis.   Chest x-ray showed suboptimal inspiration, stable cardiomegaly,  atelectasis, and/or pneumonia in the lung bases.  No evidence of  pulmonary edema.   HOSPITAL COURSE BY PROBLEM:  1. End-stage renal disease.  The patient was maintained on Tuesday,      Thursday, Saturday the hemodialysis schedule.  No infection noted      in graft.  2. Fever.  The patient was admitted to the step-down unit secondary to      his fever.  The patient was put initially on Avelox,  vancomycin,      and Fortaz to provide empiric antibiotic coverage.  The patient was      noted to have pneumonia on chest x-ray.  This was his likely source      of fever.  However, the patient did have a questionable left big      toe infection  plus or minus osteomyelitis versus possible abscess      in the left lower extremity.  The patient was evaluated with a CT      scan that failed to see any abscesses.  Further evaluation with an      MRI for rule out of osteomyelitis was needed, but  patient refused      to have this done secondary to being claustrophobic.  The patient's      fever gradually resolved after the second day of hospital stay.      Blood cultures x2 were negative.  The patient also had a UA and      culture sensitivity which was unrevealing.  The patient quickly      improved during hospital stay and was discharged on vancomycin and      Fortaz.  3. Left lower extremity calciphylaxis.  Wound care consult was      obtained to check on his left big toe as well as his calciphylaxis.      His MRI was not done secondary to patient preference to evaluate      for possible osteomyelitis or abscess.  CT of the left lower      extremity was obtained with the above findings.  The patient will      resume wound care as an outpatient.  4. Diabetes mellitus.  The patient had good control during hospital      stay.  The patient was continued on Actos, and sliding scale      insulin was initiated.  The patient's last hemoglobin A1c was      excellent at 6.8.  No changes made at this time.  5. Abdominal pain with radiation to the back.  The patient had a      lipase checked which was normal.  The patient also had a CT of the      abdomen with the above findings.  The etiology of the patient's      abdominal pain is not clear at this point.  This will need to be      followed up in the outpatient setting as well.  6. Anxiety.  The patient was continued on Paxil and Xanax.  7.  Hyperlipidemia.  The patient is currently not on any medications.      This will need to be followed up by the patient's primary care      physician.   DISCHARGE PHYSICAL EXAMINATION:  VITALS:  Temperature 98.7, pulse 89,  respiratory rate 20, blood pressure 148/88, O2 sats 98% on room air, CBG  115.      Rufina Falco, M.D.  Electronically Signed     JY/MEDQ  D:  03/26/2007  T:  03/27/2007  Job:  [id number]

## 2010-10-25 NOTE — Assessment & Plan Note (Signed)
[facility]   NAME:  Sean [name], SUPINSKI NO.:  [id number]   MEDICAL RECORD NO.:  [id number]      DATE OF BIRTH:  March 18, 1936   PHYSICIAN:  Leonie Man, M.D.    VISIT DATE:  11/02/2008                                   OFFICE VISIT   PROBLEM:  Right lateral knee ulceration in this diabetic man with end-  stage renal disease.   This ulceration is secondary to calciphylaxis.  The current wound  measurement is 0.  The wound is now completely healed.  His most recent  past treatment has been with Promogran and hydrogel with an Allevyn pad.  The patient is cautioned to keep his legs moisturized.  Follow up for  him will be p.r.n. in this clinic.      Leonie Man, M.D.  Electronically Signed     PB/MEDQ  D:  11/02/2008  T:  11/02/2008  Job:  [id number]

## 2010-10-25 NOTE — Assessment & Plan Note (Signed)
[facility]   NAME:  Sean [name], Sean [name] NO.:  [id number]   MEDICAL RECORD NO.:  [id number]      DATE OF BIRTH:  02-Jul-1935   PHYSICIAN:  Maxwell Caul, M.D. VISIT DATE:  07/20/2008                                   OFFICE VISIT   Mr. Chain is a gentleman who we have been following here for some  period of time.  He has wounds that are due to lower extremity  calciphylaxis, also some degree of venous stasis.  I last saw him here  on June 29, 2008.  He has been using an antiseptic soap wash and  topical Neosporin to these wounds and bandaging this.  He returns today  in followup.   On examination, his temperature is 97.8.  He notes some degree of  erythema around the one of the wounds on his right leg; however, he has  remained systemically well.  He has 3 wounds now on his right leg, one  is above the knee, one just below the knee, and the larger wound is over  the tibia, anteriorly.  The area of erythema is noted to be lower of  these wounds.  Both, the superior wound above the knee and the larger  wound below the knee anteriorly underwent debridement.  As usual, this  is not well tolerated by the patient in spite of copious amounts of  injectable lidocaine.  However, after debridement, there was still some  adherent eschar that I could not remove.  The smaller wound, which is in  the middle of the 2 other wounds, was not debrided.   IMPRESSION:  Complicated wounds in the setting of venous stasis, as well  as calciphylaxis, and chronic renal insufficiency.  To the wound above  his knee and the major wound below his knee, we prescribed Santyl.  This  will need to be changed daily.  The surrounding skin will need to be  protected with Vaseline.  We used moist gauze and an Ace wrap.  The  middle of the 2 wounds, he can continue his Neosporin, this is tiny and  ultimately, it may need to be debrided although I did not do this today.   Because of the degree of erythema around this major wound, I gave him a  7-day course of doxycycline.  I did not see anything that would be  exactly worth culturing here.  We will follow this clinically.           ______________________________  Maxwell Caul, M.D.     MGR/MEDQ  D:  07/20/2008  T:  07/20/2008  Job:  [id number]

## 2010-10-25 NOTE — Assessment & Plan Note (Signed)
[facility]   NAME:  AVENIR, LOZINSKI             ACCOUNT NO.:  [id number]   MEDICAL RECORD NO.:  [id number]      DATE OF BIRTH:  1936-02-25   PHYSICIAN:  Jake Shark A. Tanda Rockers, M.D. VISIT DATE:  02/18/2007                                   OFFICE VISIT   SUBJECTIVE:  [name] is a 75 year old man with end-stage renal  disease who was last seen on January 30, 2007. The patient was being  treated for calciphylaxis with onychomycosis.  In the interim, we  instructed him to use the Iodosorb ointment and 4x4 dressings.  In the  interim, the patient was admitted to the hospital for symptoms of  shortness of breath and fever and was in fact treated for pneumonia  without complication.  He was discharge and he had is now returned for  follow-up.  There has been no interim fever related to his wounds.  There is been no malodorous drainage.  The wounds continue to be  painful.  There are no additional satellite lesions in the right leg.   OBJECTIVE:  Blood pressure is 141/98, respirations 16, pulse rate 82,  temperature 97.1.  Capillary blood glucoses 108 mg percent.  Inspection  of the lower extremity shows that the chronic ulcers of calciphylaxis  are persistent but have significantly decreased in their moist exudate.  The posterior gastrocnemius wound has a moderate amount of erythema and  is mildly tender but there is no evidence of abscess formation.  There  is a moderate amount of liquefaction necrosis associated with each  wound. The shaggy eschars were trimmed with scissors to avoid snagging.  His ankle-brachial indices were indeterminate due to noncompressibility  of his vessels.  The capillary refill is normal and there is no evidence  of acute ischemia.   ASSESSMENT:  Stable wounds of calciphylaxis without evidence of active  infection.   PLAN:  We have instructed the patient to proceed with daily antiseptic  soap washes he may shower.  He may take a tub bath.  He is to apply a dry  dressing to be held in place by a fish net.  We are discontinuing his  Iodosorb at this time.  We will reevaluate the patient in 2 weeks p.r.n.  If he develops increasing pain and redness or becomes concerned in any  way he is to call the clinic for follow-up.      Harold A. Tanda Rockers, M.D.  Electronically Signed     HAN/MEDQ  D:  02/18/2007  T:  02/18/2007  Job:  [id number]

## 2010-10-25 NOTE — Discharge Summary (Signed)
NAME:  Sean [name], Sean [name] NO.:  [id number]   MEDICAL RECORD NO.:  [id number]          PATIENT TYPE:  REC   LOCATION:  FOOT                         FACILITY:  [facility]   PHYSICIAN:  Lenon Curt. Chilton Si, M.D.  DATE OF BIRTH:  Apr 20, 1936   DATE OF ADMISSION:  01/17/2008  DATE OF DISCHARGE:                               DISCHARGE SUMMARY   HISTORY:  A 75 year old male diabetic on dialysis for end stage renal  disease, suffers from a problem of calciphylaxis with ulcerations of his  leg, he also has venous insufficiency problems and large varicose veins.  He has had chronic left leg ulcerations and is here for re-evaluation of  those wounds.   Temperature 97.6, pulse 81, respirations 116, blood pressure 155/93.  Patient states he is off all medications for diabetes and for his blood  pressure.   His leg edema is better, there is an irregular series of wounds along  the left lower leg which coalesce, there is a hard eschar present.  Previously described as blackened and now is greenish to steely gray.  There are old scars from previous ulcerations and there was a small  amount of creamy exudate exuding from the edge of the upper part of his  wound on the left leg.   Patient's eschar is firmly affixed enough that I feel a sharp  debridement is likely to create too much distress and bleeding at the  present time.  I have elected therefore to have him apply Santyl daily,  covered by gauze and fishnet and then return for re-evaluation in  approximately 1 week.  Ultimately sharp debridement of these areas of  eschar is likely to be needed although they may slough off on their own.      Lenon Curt Chilton Si, M.D.  Electronically Signed     AGG/MEDQ  D:  01/20/2008  T:  01/20/2008  Job:  [id number]   cc:   Terrial Rhodes, M.D.  Fax: [phone]

## 2010-10-25 NOTE — Assessment & Plan Note (Signed)
[facility]   NAME:  Sean [name], TULLY NO.:  [id number]   MEDICAL RECORD NO.:  [id number]      DATE OF BIRTH:  01/29/1936   PHYSICIAN:  Maxwell Caul, M.D. VISIT DATE:  03/09/2008                                   OFFICE VISIT   HISTORY:  [name] is being followed here for a difficult set of  ulcerations on his left anterior and lateral lower leg.  In the past,  this has been labeled as calciphylaxis with ulceration, although I was  never able to find the exact documentation for this.  He clearly has  large venous varicosities in the setting of what looks to be large  venous stasis wounds.  He has continued with Silverlon under Profore  Lite and he is tolerating this reasonably well, although he still has a  fair amount of pain and some drainage.   PHYSICAL EXAMINATION:  His temperature is 98.3.  The area has of largely  improved dramatically since the last time I saw him.  The area on the  left lateral lower leg that we have previously labeled #12, continues to  epithelialize and this is almost 100% closed over.  He had several  smaller areas on the upper left lateral leg that required selective  debridements.  He tolerates these very poorly and ultimately I had to  use injectable lidocaine and then these were debrided of some thick  adherent eschar.  However, underneath the granulation looks healthy and  I am really hopeful that there is progressive epithelialization here.   IMPRESSION:  Venous stasis ulcerations on the left leg.  He has  continued to do well.  I will continue to use Silverlon on the upper  lateral leg and we will continue to wrap in a Profore Lite.  He  continues to make good progress.  I do not have any documentation of the  calciphylaxis, however, this appears to be healing well.           ______________________________  Maxwell Caul, M.D.     MGR/MEDQ  D:  03/09/2008  T:  03/10/2008  Job:  [id number]

## 2010-10-25 NOTE — Assessment & Plan Note (Signed)
[facility]   NAME:  Sean [name], Sean [name] NO.:  [id number]   MEDICAL RECORD NO.:  [id number]      DATE OF BIRTH:  Jun 20, 1935   PHYSICIAN:  Maxwell Caul, M.D. VISIT DATE:  09/23/2007                                   OFFICE VISIT   HISTORY OF PRESENT ILLNESS:  Sean [name] is a gentleman we have been  following for ulcers involving his lower extremities secondary to  calciphylaxis.  He has been receiving topical Iodosorb, 4 x 4's, and  nonadhesive dressing.  He continues to use antiseptic soap wash.  He  returns today in followup.  He continues on dialysis.  There have been  no interim fevers.  He states the wound on his left medial leg has been  painful; however, there has been no drainage, and no other systemic  symptoms.   PHYSICAL EXAMINATION:  VITAL SIGNS:  On examination, temperature is  98.4, pulse 89, and respirations 121/78.  SKIN:  The wounds on the right anterior lower leg and the right anterior  upper leg, that we have labeled 7 and 8 previously, have resolved.  There are two remaining areas, one is on the left medial lower leg  measuring 3 x 1.8 x 0.1, on the left anterior lower leg is an area  measuring 2 x 2 x 3.4 x 0.1.  Both of these areas are similar in  appearance.  There has been evidence of good epithelialization and  healing; however, the centers of the wounds have an adherent black  eschar (scab).  My feeling is that these do not need to be debrided and  are probably on their way to full healing.  For now, I continued the  Iodosorb treatment that he is doing, as he already has this at home.  The area that was painful on the left medial lower leg had no evidence  of surrounding infection.   IMPRESSION:  Ulcers of lower extremities in the setting of calciphylaxis  and chronic renal failure.   We have continued to use antiseptic soap wash, topical Iodosorb  applications, and Ace wrap.  We will see the patient again in 2  weeks.  My hope would be that this does not need to be further debrided.  I  suspect these are on their way to healing underneath the areas that I  cannot see.           ______________________________  Maxwell Caul, M.D.     MGR/MEDQ  D:  09/23/2007  T:  09/24/2007  Job:  [id number]

## 2010-10-25 NOTE — Assessment & Plan Note (Signed)
[name]                            CARDIOLOGY OFFICE NOTE   Sean [name]                    MRN:          [id number]  DATE:10/07/2007                            DOB:          08/16/1935    Sean [name] is a very pleasant 75 year old male with past medical history  of end-stage renal disease, diabetes, hypertension, hyperlipidemia who  presents for evaluation of chest tightness, fatigue and prior to  initiating an exercise program.  The patient has no prior cardiac  history, although he does state he has had some previous strokes.  He  typically has mild dyspnea with more extreme activities, but not with  routine activities.  There is no orthopnea, PND, pedal edema,  palpitations, pre-syncope, syncope.  He occasionally feels tight in his  chest when he has excess volume on his chest, but this increases with  lying flat.  He otherwise does not have exertional chest pain.  He has  also had some recent increase in fatigue.  He is scheduled to initiate  an exercise program at the [facility] and cardiology has asked for further  evaluate.  Note, he has had diabetes for approximately 30 years.   MEDICATIONS:  1. Toprol 50 mg daily.  2. Lipitor 40 mg daily.  3. Paxil 40 mg daily.  4. Actos 15 mg daily.  5. Phos Renal.   HE HAS AN ALLERGY TO LOTENSIN.   SOCIAL HISTORY:  He has remote history of tobacco use, but has not  smoked in 25 years.  He occasionally consumes alcohol.   FAMILY HISTORY:  Is negative for coronary artery disease.   PAST MEDICAL HISTORY:  1. Hypertension.  2. Diabetes mellitus.  3. Hyperlipidemia.  4. He has had prior cerebral vascular accidents by his report.  5. He has end-stage renal disease and has been dialysis dependent for      6 years.  6. He he has a history of gout.  7. History of secondary hyperparathyroidism.  8. There is also history of peripheral vascular disease.  9. He has had previous abdominal  surgery.   Note, I have seen the patient in the office in December 2005 secondary  to an episode of syncope.  A previous echocardiogram on June 29, 2004  showed normal LV function.  There was mild tricuspid regurgitation.  A  previous Myoview in January 2006 was normal with an estimate ejection  fraction of 59%.   REVIEW OF SYSTEMS:  He denies any headaches or fevers and chills.  Has  had a has had a cough that is productive of phlegm.  There is no  hemoptysis.  There is no dysphagia, odynophagia, melena or hematochezia.  He does not make any urine.  He does have some problems with ulcers on  his lower extremities.  Remaining systems are negative.   PHYSICAL EXAMINATION:  Today shows a blood pressure 108/68 and pulse is  92.  Weighs 263 pounds.  He is well-developed and obese.  No acute  distress.  His skin is warm and dry. Does not appear to be depressed.  There is no  peripheral clubbing.  BACK:  Normal.  HEENT:  Normal.  Normal eyelids.  NECK: Neck is supple.  Normal upstroke bilaterally.  No bruits heard.  There is no jugular venous distention and no thyromegaly noted.  CHEST:  Chest is clear to auscultation.  There is normal expansion.  CARDIOVASCULAR:  Reveals a regular rate and rhythm with normal S1-S2.  I  cannot appreciate murmurs, rubs or gallops.  His heart sounds are  distant.  ABDOMEN: On exam nontender.  Positive bowel sounds. No  hepatosplenomegaly. No mass appreciated.  There is no abdominal bruit.  Has had previous abdominal surgery.  His femoral pulses are difficult to  palpate due to his obesity.  EXTREMITIES: His extremities show some ulcers that are healing  bilaterally.  His distal pulses are diminished.  I cannot palpate cords.  NEUROLOGIC:  Exam is grossly intact.   His electrocardiogram shows a sinus rhythm at a rate of 92.  There  appears to be a prior anterior infarct.  Note, he had a left bundle  branch block on his most recent in January 2006.    DIAGNOSIS:  1. Atypical chest pain - Sean [name]'s symptoms are atypical, but he      does have multiple risk factors and abnormal electrocardiogram.      Before initiating an exercise program, we will scheduled to have an      adenosine Myoview.  2. Hypertension - His blood pressure is adequately controlled on his      present medications.  3. Hyperlipidemia - He will continue his Lipitor and this is being      monitored by his primary care physician.  4. Diabetes mellitus - Per his primary care physician.  I have also      asked him to begin aspirin 81 mg daily.  5. End-stage renal disease - This is being managed by the      nephrologist.   We will see back on as-needed basis pending results as Myoview.     Madolyn Frieze Jens Som, MD, [facility]  Electronically Signed    BSC/MedQ  DD: 10/07/2007  DT: 10/07/2007  Job #: [id number]

## 2010-10-25 NOTE — Discharge Summary (Signed)
[name], [name] NO.:  [id number]   MEDICAL RECORD NO.:  [id number]          PATIENT TYPE:  INP   LOCATION:  6738                         FACILITY:  [facility]   PHYSICIAN:  Anselm Pancoast. Weatherly, M.D.DATE OF BIRTH:  1936/05/21   DATE OF ADMISSION:  06/19/2008  DATE OF DISCHARGE:  06/23/2008                               DISCHARGE SUMMARY   DISCHARGE DIAGNOSES:  1. Secondary hyperparathyroidism.  2. Chronic renal failure on hemodialysis.   OPERATION:  Exploration of neck with removal of three large parathyroid  glands and right thyroid lobectomy.  Supposedly a small fourth  parathyroid tissue removed and I think there is a probably a right  inferior parathyroid remaining.   HISTORY:  Sean [name] is a 75 year old male, end-stage renal disease  patient that I first met I think about 5 years ago when he had an  ischemic bowel, and I explored him and performed a right colectomy.  He  has had a progression with secondary hyperparathyroidism.  His PTH is  about a level of 2900.  He had refused to have any type of parathyroid  gland removal, but then they coached him into seeing me and I saw him in  the office and since he knew me, he agreed to the planned procedure.  He  was scheduled for the surgery on the 8th, being hemodialyzed the day  before.  On physical examination, he is a very heavy short black male  with kind of a real stocky neck, which we were not able to feel any  masses within the neck.  His laboratory studies on the day of surgery  showed his hematocrit of 42 with white count of 5600 and his potassium  was 4.8.  Glucose 103 and a creatinine of 7.24.  He was taken to surgery  late in the afternoon on the 8th.  Dr. Corliss Skains assisted and on the neck  exploration his gland was not truly substernal, but very low and very  difficult.  We were able to find two large left parathyroids fairly  promptly.  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  On the right side, we found a right  parathyroid gland that I think it is the right superior.  I could not  identify a fourth gland and the pathologist who was looking at the  various tissues and all; one was a lymph node.  Then we went ahead and  did a right thyroid lobectomy and on frozen exam they were not able to  identify any parathyroid tissue.  I was able to contact Dr. Darrick Penna,  he is the nephrologist for the patient.  Since we had only identified  three glands and not a fourth, he went and froze the parathyroid tissue  that we had removed in case he bottom out and would do a transplant at a  later time.  Postoperatively, he did satisfactory.  His calcium levels  did drop quite abruptly, but then stabilized in a range of  8 and we  still feel that even though on the final path exam they said they  identified a fourth adenoma.  The fourth area was very small, and I am  sure it is not truly the remaining gland and we expect that he still has  got 1-4 hyperplastic parathyroid glands remaining.  He was discharged on  approximately the fourth postoperative day after his hungry bone  syndrome was being treated with oral calcium.  He will be followed in  the hemodialysis clinic.  They are planning on rechecking a parathyroid  level hormone in approximately a month and whether he will need a re-  exploration and removal of the fourth gland with transplant or not will  be determined on how his calcium and parathyroid levels are running.  His incisions are healing satisfactorily at the time of his discharge.  He is discharged to continue all of his chronic renal medications  which are calcium carbonate 3 with each meal, Lyrica 75 mcg at bedtime,  Xanax 0.25 twice daily, Paxil 20 mg daily, Nepro-Vite each day, and then  his EPO medications given at hemodialysis.  I will see him in the office   for wound check in approximately a week.  He is taking Tylenol for  incisional pain at the time of his discharge.           ______________________________  Anselm Pancoast. Zachery Dakins, M.D.     WJW/MEDQ  D:  07/30/2008  T:  07/30/2008  Job:  [id number]   cc:   [place], MD

## 2010-10-25 NOTE — Assessment & Plan Note (Signed)
[facility]   NAME:  Sean [name], Sean [name]             ACCOUNT NO.:  [id number]   MEDICAL RECORD NO.:  [id number]      DATE OF BIRTH:  [birth year]   PHYSICIAN:  Jake Shark A. Tanda Rockers, M.D. VISIT DATE:  10/07/2007                                   OFFICE VISIT   SUBJECTIVE:  Sean [name] is a 75 year old man who we followed for painful  ulcers of calciphylaxis in the left lower extremity.  In the interim, he  has confined of more pain.  He has not had fever.  He continues on his  dialysis.  There has been no excessive drainage or malodor.  He  continues to be ambulatory.   OBJECTIVE:  Blood pressure is 121/75, respirations 16, pulse rate 93,  temperature is 98.4, and capillary blood glucose is 110 mg %.  Inspection of the left lower extremity shows that there is 2+ edema with  well-formed eschars over areas, labeled wound #9 and wound #10.  There  is a halo of the erythema.  There is moderate-to-severe pain associated  with these lesions, but there is no evidence of abscess.  There is  relatively firm induration.  There is no ascending lymphangitis.  Both  feet are warm with excellent capillary refill and no evidence of  concurrent ischemia.   ASSESSMENT:  Ulcers of calciphylaxis.   PLAN:  We will continue the use of topical Iodosorb and mild  antibacterial soap.  We will provide analgesia with Tylenol #3, 2  tablets every 4 to 6 hours p.r.n. for pain.   We will reevaluate the patient in 2 weeks.  However, if he experiences  progressive pain or drainage from these wounds, he will call the clinic  for interim evaluation.  We have given the patient opportunity to ask  questions.  He seems to understand his instructions and indicates that  he will be compliant.      Harold A. Tanda Rockers, M.D.  Electronically Signed     HAN/MEDQ  D:  10/07/2007  T:  10/07/2007  Job:  [id number]

## 2010-10-25 NOTE — Assessment & Plan Note (Signed)
[facility]   NAME:  Sean [name], Sean [name]             ACCOUNT NO.:  [id number]   MEDICAL RECORD NO.:  [id number]      DATE OF BIRTH:  [birth year]   PHYSICIAN:  Jake Shark A. Tanda Rockers, M.D. VISIT DATE:  01/30/2007                                   OFFICE VISIT   SUBJECTIVE:  Mr. Kemmerer is a 75 year old man who was last seen in the  [facility] on January 23, 2007, for evaluation of calciphylaxis.  He  was treated with Accuzyme and Profore wraps.  He continues to have  significant pain.  He is taking Tylenol No. 3 with significant relief.  There has been no interim fever.  He continues on his dialysis protocol.   OBJECTIVE:  Blood pressure is 111/81, respirations 18, pulse rate is 93,  temperature 98.2.  Capillary blood glucose is 105 mg%.  Inspection of  the lower extremity shows that there is bilateral edema on the left  lateral posterior leg.  There are punctate ulcerations consistent with  calciphylaxis.  As a result of the Accuzyme the eschars are now  softened.  There is scant drainage.  There is moderate hyperemia and  induration associated with these wounds and they are tender to touch.  The patient's toenails are grossly overgrown with subungual mycosis.  The patient is requesting that they be trimmed.  There is no evidence of  arterial insufficiency.  The capillary refill is brisk.There is no  evidence of infection and there is no troublesome malodor.   ASSESSMENT:  Calciphylaxis and onychomycosis.   PLAN:  We have trimmed and debrided all 10 nails without difficulty.  The left hallux required a digital block in order to relieve an ingrown  toenail.  The ingrown toenail was removed by direct excision.  With  regard to the lesions of calciphylaxis, we will apply topical Iodosorb  and a dry dressing.  We will not reapply the Accuzyme.  We would rather  effect a dry, natural eschar and allow the wounds to spontaneously  stabilize. We have explained this approach to  the patient in terms that  he seems to understand.  We will reevaluate  him in 1 week p.r.n.      Harold A. Tanda Rockers, M.D.  Electronically Signed     HAN/MEDQ  D:  01/30/2007  T:  01/31/2007  Job:  [id number]

## 2010-10-25 NOTE — Op Note (Signed)
[name], Sean [name] [name] NO.:  [id number]   MEDICAL RECORD NO.:  [id number]          PATIENT TYPE:  OBV   LOCATION:  6738                         FACILITY:  [facility]   PHYSICIAN:  Anselm Pancoast. Weatherly, M.D.DATE OF BIRTH:  Oct 29, 1935   DATE OF PROCEDURE:  06/19/2008  DATE OF DISCHARGE:                               OPERATIVE REPORT   PREOPERATIVE DIAGNOSES:  Secondary hyperparathyroidism and chronic renal  failure.   OPERATION:  Exploration of neck with removal of superior and inferior  hyperplastic left parathyroid, removal of right probable superior  parathyroid, and then a right thyroid lobectomy, fourth parathyroid  gland was never identified.   HISTORY:  Sean [name] Sean [name] [name] is a 75 year old male end-stage renal disease  patient who was referred to me by Dr. Arrie Aran for a parathyroid gland  removal and transplant to the right arm.  The patient first met 3 years  ago when I did a right colectomy for ischemic colon.  He has been on  dialysis Tuesday, Thursday Saturday since then and has done fine with no  further problems with his abdomen.  He has had problems with markedly  elevated parathyroid hormone and finally he has agreed to proceed on  with a parathyroidectomy and desired that I do his surgery.  He was  scheduled earlier today but because of the bed situation, etc., it was  approximately 5 o'clock and we finally got started.  We first contacted  the Pathology Lab to make sure that they kept the pathologist available  since we would be doing frozen sections and the patient was induced with  general anesthesia, endotracheal tube and then positioned on the OR  table.  He is a large, very muscular male with nothing palpable in his  neck and repositioning with a Gelfoam up under his back since really  getting through the neck was going to be difficult.  The ligament  slipped up and then extended as much as possible.  I then prepped him  with Betadine surgical  scrub and solution, and we had his IV in his  right arm, with hemodialysis in the left arm, and we re-prepped the  right arm at the end of surgery for the transplant.  The patient's area  was neck.  He was in a kind of a mild reverse Trendelenburg and I then  made a marking about two fingers above the notch, not really in one of  the skin folds since one is too low and the other is too high, and then  after marking the transverse incision with a silk suture, sharp  dissection of the skin, subcutaneous, and platysma.  He has got very  large neck veins that were freed up and then ligated with 2-0 Vicryl  superior and inferior, and then this platysma flap was elevated  superiorly and then inferiorly down to the actual sternal notch.  I then  elevated and placed the Mahorner retractor in and then divided the  midline down, over the thyroid.  His thyroid was very low in the neck  and the incision was really a kind of more over the  larynx just because  of the way he was positioned.  We tried to hyperextend his neck, so we  could get a little better exposure, but really not able to actually get  much better exposure.  I was working on the right side and I first  started examining the left side.  The middle vein was divided between  small clips and the Harmonic scalpel, and then I could kind of rotate  the thyroid gland to the right.  He was rotated and then we found a  superior, actually a thyroid gland fairly easily that was quite large.  The left superior was 20 x 12 x 15, and then shortly later found a  second left that measured 12 x 12 x 18.  These were measured.  Most of  the parathyroid was placed in the ice container sterilely and we sent  samples down to the pathologist and then started working on the opposite  side.  We dissected the right side.  It was more difficult, lower in the  substernal area and we first followed an area that we thought was going  to be an inferior parathyroid,  but after we were kind of partially  dissecting it out etc., we felt that no, this was actually a part of the  thyroid gland, kind of tucked under, that was a kind of little nodule in  it, and we elected not to go ahead and proceed removing of it since we  were convinced that this was thyroid and not parathyroid.  We continued  exploring along the tracheal area of the esophageal groove lateral over  to the carotid.  He has got a large artery at the top of the mediastinum  right in the area and we found a gland that I think is going to be  inferior, but I am not positive that was obviously parathyroid.  It  measured 12 x 15 x 10 and actually about 80% of it was saved and the  sample sent to the lab, and then we were asked in the lab what was  happening on the first two since we had sent three samples down.  There  was an area also that I excised that looked like fatty tissue that was  labeled fourth specimen and it was sent down.  Dr. Nedra Hai was in the lab  and then they found out that the specimen that had been carried to the  lab was still in the front part of the lab even though they were  expecting.  It was not sent to the lab for frozen section as it should  be, but they went ahead and looked at all for of these tissues and  identified that the two on the left were definitely parathyroids, the  one on the right was definitely a parathyroid and then there was an area  that was definitely fatty tissue as we had anticipated.  I then went  ahead and removed the distal area of the thyroid, but actually just more  excisional biopsy type area and sutured the area with a 3-0 Vicryl and  it was thyroid, and then the quandary of what to do next.  We went ahead  and completely freed the superior thyroid artery on the right, elevated  the right lobe of the thyroid, identified the recurrent laryngeal nerve.  It was coming right under a little nodular area that we thought was  thyroid, but we were  trying to convince ourselves that  it might be a  right side possibly superior parathyroid and did a right lobectomy.  I  took the isthmus off the median with Harmonic scalpel, and Dr. Nedra Hai has  looked at all of these specimen and said that everything she seeing was  thyroid, there was no parathyroid in this tissue as best she can tell.  I then after switching back to the right side and re-explored, Dr. Corliss Skains  assisted me and he went through looking at all areas.  We were satisfied  there was good hemostasis and put a little piece of Surgicel where the  gland had been removed and right over the recurrent laryngeal nerve on  the right, and then closed the strap muscles with interrupted sutures of  4-0 Vicryl, closed the platysma with the same 4-0 Vicryl, and then 4-0  Monocryl subcuticular and staples and Steri-Strips on the skin.  I  called Dr. Craige Cotta who is on-call for Nephrology, but he never answered my  called.  The patient had been seen by Dr. Briant Cedar earlier today.  Then  I called Dr. Darrick Penna at home who also knows the patient and he was in  agreement that one lest not do any type of transplant since very likely  there is a fourth gland.  We think it is very unlikely, that it would be  only three, but to take the tissue that we had on the back field and  freeze it in liquid nitrogen or it dries which we will and then if his  calcium does drop down to critical level we could do the transplant  early next week.  I think this is unlikely.  I think he has got a fourth  parathyroid and afterwards if he is not able to be medically managed we  will try to localize it with a sestamibi scan and then when the last  parathyroid is removed we will actually transplant at that time.  The  patient was awakened.  I have instructed the family of what we found in  etc. in the patient.  We will check potassium in the recovery room,  hopefully we will need hemodialysis until tomorrow.  He appears to be   breathing fine.  His voice is satisfactory and we will be watching serum  calciums over the next 24 hours.  I am not sure whether it is in the  right neck and we just missed it or whether it was down actually in the  superior mediastinum, but I think the appropriate was what we did and  not try to do much more radical dissection at this time.           ______________________________  Anselm Pancoast. Zachery Dakins, M.D.     WJW/MEDQ  D:  06/19/2008  T:  06/20/2008  Job:  [id number]   cc:   Terrial Rhodes, M.D.

## 2010-10-25 NOTE — Assessment & Plan Note (Signed)
[facility]   NAME:  Sean [name], Sean [name]                 ACCOUNT NO.:  o   MEDICAL RECORD NO.:  [id number]      DATE OF BIRTH:  1936-06-05   PHYSICIAN:  Leonie Man, M.D.    VISIT DATE:  09/14/2008                                   OFFICE VISIT   PROBLEM:  DFU  The patient is a diabetic black male with end-stage renal disease, on  hemodialysis.  He presents with 2 nonhealing ulcers, originally one at  the anteromedial on the lower extremity on the left and one at the right  knee laterally.  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  The patient returns  today after having been treated with hydrogel and a dry dressing on his  most recent visit for both of these wounds for reevaluation.   On examination, the patient is a well-developed, well-nourished man.  He  is afebrile at 98.4, pulse is 85, respirations 20, and blood pressure  97/61.  Most recent capillary blood basins is 121.  The wound of the  medial right calf is now healed.  The pigmented melanin  cell did not  return, but the wound is fully epithelialized.  On the right knee  laterally, the wound now measures 1.2 x 0.5 x 0.1.  There is an eschar  over the wound and upon debridement there is clean tissues below without  any evidence of infection.  TREATMENT/ with Promogran and hydrogel with a bulky dressing and to  follow up with [place] in 1 week.      Leonie Man, M.D.  Electronically Signed     PB/MEDQ  D:  09/14/2008  T:  09/15/2008  Job:  [id number]

## 2010-10-25 NOTE — Assessment & Plan Note (Signed)
[facility]   NAME:  Sean [name], Sean [name]             ACCOUNT NO.:  [id number]   MEDICAL RECORD NO.:  [id number]      DATE OF BIRTH:  Oct 16, 1935   PHYSICIAN:  Jonelle Sports. Sevier, M.D.  VISIT DATE:  05/20/2008                                   OFFICE VISIT   HISTORY:  This is a 75 year old black male with hyperparathyroidism is  being followed for ulcerated calciphylaxis-type lesions on his bilateral  lower extremities.  The one on the right extremity is quite high near  the knee and has been treated simply with an application of Polysporin  and a dry dressing pad with periodic debridements.   The one on the left anterior pretibial area is high but several  centimeters distal to the tibial plateau.  It has been treated with  debridement and the use of Silverlon, hydrogel, and a Profore Lite wrap.   The patient reports that he has seen considerable improvement in the  right lower extremity lesion and suspects there has been improvement on  the left as well.  He has had no particular new problems in association  with this.  He tends parathyroid surgery of several weeks hence.   EXAMINATION:  Blood pressure 106/70, pulse 102, respirations 22,  temperature 97.7.  Random capillary blood glucose 163 mg/dL.   The wound on the right lower extremity measures 0.6 x 0.7 x 0.1 cm and  is covered with a dark eschar.   The wound on the left pretibial area now measures 0.6 x 0.9 x 0.1 cm and  is covered with a somewhat more dense eschar.   IMPRESSION:  Gradual progress of calciphylaxis lesions on bilateral  lower extremities.   DISPOSITION:  After preparation with 5% topical lidocaine, both these  areas are debrided and this despite the lidocaine does cause the patient  some pain.  The debridement of the eschar on the right lower extremity  yields some hardened carbonate material which is likely calcium and this  is not removed in its entirety because of the pain involved.   The left wound is relieved of its eschar more easily without  encountering any underlying calcium.   The right lower extremity wound is then dressed with an application of  Polysporin and a dry dressing as before, and he is returned to his  compression stocking on that leg.  He will change this dressing on an  every 2-day basis.   The left lower extremity is treated with an application of hydrogel,  Silverlon, soft sore pad, and a Profore Lite wrap.   Followup visit is set for 9 days with a thought of then making the  subsequent visit then 10 days and this will get him back on his regular  Monday schedule.           ______________________________  Jonelle Sports. Cheryll Cockayne, M.D.     RES/MEDQ  D:  05/20/2008  T:  05/20/2008  Job:  [id number]

## 2010-10-25 NOTE — Assessment & Plan Note (Signed)
[facility]   NAME:  [name], [name] NO.:  [id number]   MEDICAL RECORD NO.:  [id number]      DATE OF BIRTH:  [birth year]   PHYSICIAN:  Maxwell Caul, M.D. VISIT DATE:  08/24/2008                                   OFFICE VISIT   HISTORY:  [name] is a gentleman we last saw on August 10, 2008.  He  has a difficult set of wounds which include venous stasis and  calciphylaxis in the setting of chronic renal failure.  These wounds  have predictably a thick adherent eschar and some slough.  He tolerates  debridement generally poorly because of pain in spite of injectable  lidocaine.   PHYSICAL EXAMINATION:  He is afebrile.  The inferior wound on the right  lateral leg had less adherent slough.  Nevertheless, this was debrided.  He tolerated this well.  The base of this appears clean.  The wound just  above his knee on the right has the usual adherent eschar.  In spite of  injectable lidocaine, he does not tolerate debridement of these wounds  well.  He will need to continue Santyl on this wound.   IMPRESSION:  Mixed pathogenesis wounds, right leg, probably mostly  calciphylaxis in the setting of chronic renal insufficiency but clearly  some degree of venous stasis.  The wound is superiorly above the knee on  the lateral aspect.  We will continue with Santyl moist gauze dry  dressing and an Ace wrap.  To the wound on the right leg below the knee  medially, he can a use Silverlon moist gauze and dry dressing, all of  these under an Ace wrap.   Apparently, he has had home health ordered through dialysis  (questionably [facility]).  We will contact them to make sure the orders are  correct.           ______________________________  Maxwell Caul, M.D.     MGR/MEDQ  D:  08/24/2008  T:  08/24/2008  Job:  [id number]

## 2010-10-25 NOTE — Assessment & Plan Note (Signed)
[facility]   NAME:  Sean [name], Sean [name]             ACCOUNT NO.:  [id number]   MEDICAL RECORD NO.:  [id number]      DATE OF BIRTH:  July 29, 1935   PHYSICIAN:  Jake Shark A. Tanda Rockers, M.D. VISIT DATE:  03/04/2007                                   OFFICE VISIT   SUBJECTIVE:  Sean [name] is a 75 year old man with calciphylaxis  associated with end-stage renal disease and chronic hemodialysis.  Over  the past several weeks we have followed him for lesions of  calciphylaxis, treating him with topical Iodosorb.  There has been no  interim drainage, but the pain is moderate but well controlled with p.o.  analgesia.  He continues his dialysis without complication.  He is  complaining of an area over the left lateral foot which has become  tender over the last 2-3 days.   OBJECTIVE:  Blood pressure is 136/86, respirations 18, pulse rate is  102, temperature is 98.9.  Capillary blood glucose is 104 mg%.  Inspection of the left lateral leg shows that the previous areas of  calciphylaxis are dry with no evidence of exudate, drainage or malodor.  The dorsalis pedis pulse is readily palpable.  There is 1-2+ edema in  the left lower extremity.  The point tenderness over the head of the  fifth metatarsal of the left foot is not associated with ulceration or  evidence of undue pressure or trauma.  There are several prominent  varicosities associated on the lateral compartment of the left lower  extremity.   IMPRESSION:  Clinical improvement of the ulcers of calciphylaxis.   PLAN:  We will continue the topical application of Iodosorb.  We will  reevaluate the patient in 2 weeks.  With regard to his varicosities  associated with the edema in the left lower extremity, we have suggested  that the patient discuss these varicosities with [facility], whom he has an appointment to see this week.  We have given  the patient an opportunity to ask questions.  He seems to  understand and  expresses gratitude for having been seen clinic.  We will see him back  in 2 weeks.      Harold A. Tanda Rockers, M.D.  Electronically Signed     HAN/MEDQ  D:  03/04/2007  T:  03/04/2007  Job:  [id number]

## 2010-10-25 NOTE — H&P (Signed)
NAME:  Sean [name], Sean [name] NO.:  [id number]   MEDICAL RECORD NO.:  [id number]          PATIENT TYPE:  INP   LOCATION:  3313                         FACILITY:  [facility]   PHYSICIAN:  Rufina Falco, M.D.     DATE OF BIRTH:  Aug 14, 1935   DATE OF ADMISSION:  02/05/2007  DATE OF DISCHARGE:                              HISTORY & PHYSICAL   CHIEF COMPLAINT:  Fever.   HISTORY OF PRESENT ILLNESS:  Patient is a 75 year old African American  man with a past medical history significant for end-stage renal disease  (HD on Tuesday, Thursday and Saturday at [facility]), hypertension, diabetes mellitus, calciphylaxis and gout who  presented to [facility] secondary to having fever during hemodialysis on  the day of admission.  The patient reports having fever, decreased p.o.  intake and abdominal tightness for approximately 2-3 days.  The patient  described abdomen as bloated and tight.  He complained of diarrhea on  "Sunday, prior to admission, with 4-5 episodes of nonbloody or melanotic  stools.  The patient also complains of a cough with white sputum x1  week.  The patient also reports pain in his left lower extremity  secondary to calciphylaxis and wound care has been following.  The  patient also reported wound care was following him for his left big toe,  which appears dark around now, but is not painful.  Patient's estimated  dry weight equals 113.   PAST MEDICAL HISTORY:  1. End-stage renal disease.  2. Hypertension.  3. Diabetes mellitus.  4. Calciphylaxis.  5. Gout.  6. Atherosclerotic cardiovascular disease, myocardial infarction in      19" 90.  7. Left ventricular hypertrophy.  8. Peripheral arterial disease.  9. Iron deficiency anemia.  10.Secondary hyperparathyroidism.  11.Hyperlipidemia.  12.History of cerebrovascular accident in 2000.  13.Colonoscopy in 2005, which revealed 4 polyps and internal      hemorrhoids.   ALLERGIES:  INCLUDE  LOTENSIN.   PAST SURGICAL HISTORY:  November of 2007, status post colectomy for his  ischemic bowel.   MEDICATIONS:  Include:  1. Paxil.  2. Xanax.  3. Clarinex.  4. Actos.  5. Ambien.  6. Dialyvite.  7. Coricidin.  8. Fosrenol.  9. Sensipar.  10.EPO at hemodialysis.  11.Zemplar at hemodialysis.   FAMILY HISTORY:  Mom passed away in her 68s from diabetes.  He does not  know about his dad.  Siblings, he had a brother who passed away from  bone cancer and a sister who passed away from lung cancer.   SOCIAL HISTORY:  Patient quit smoking 30 years ago, but had a 15-20 year  history with a maximum of 3 packs per day.  Alcohol, he drinks  approximately 2 times a month.  He denies illicit drugs.  He is  separated, has 3 children, worked as a Paediatric nurse and in Agricultural engineer.   PHYSICAL EXAMINATION:  VITAL SIGNS:  Pulse equals 128.  Blood pressure  135/64.  O2 saturations equal 89% on room air.  Temperature equals  103.1.  GENERAL:  In no acute distress.  HEENT:  AT,  [place].  Moist oral mucosa.  CV:  S1, S2.  Tachycardic.  Regular.  LUNGS:  CTA bilaterally.  Slight decrease in breath sounds and bases.  No rhonchi, wheezes or rales noted.  ABDOMEN:  Soft, obese, decreased bowel sounds, nontender.  NG, NR.  NEURO:  Nonfocal, alert.  EXTREMITIES:  Bilateral lower extremity edema.  Positive calciphylaxis.  Decreased pulses in the left lower extremity.  Left first digit with  black nail/question necrotic.  SKIN:  Calciphylaxis.   LABORATORY DATA:  Were pending.  Chest x-ray, suboptimal inspiration.  Stable CM.  Atelectasis and/or pneumonia in the lung bases.  No evidence  of pulmonary edema.   ASSESSMENT/PLAN:  1. Fever.  Differential diagnosis includes pneumonia given chest x-ray      findings versus left big toe infection versus left lower extremity      calciphylaxis +/- underlining osteomyelitis versus urinary tract      infection.  We will admit to step-down.  The patient will be       started on vancomycin, Fortaz and Avelox.  We will check blood      cultures x2, check sputum gram-stain and coagulase-negative      staphylococcus, urinalysis, urine coagulase-negative      staphylococcus, start Tylenol, check CBC with differential, check      CMET, check phosphorus, check MRI of the left lower extremity to      rule out osteomyelitis, check KUB, get wound care consult.  We will      also get plastics versus surgery consult depending on MRI findings.  2. Right lower extremity numbness.  We will check bilateral lower      extremity arterial Dopplers given peripheral arterial disease.  The      patient has weak pulses.  3. End-stage renal disease.  We will continue Tuesday, Thursday and      Saturday hemodialysis.  4. Diabetes mellitus.  We will continue Actos and sliding scale      insulin will be initiated.  His last hemoglobin A1c was 6.8.  5. Anxiety.  We will continue Paxil and Xanax.  6. Hyperlipidemia.  The patient is currently not on any medications.  7. Prophylaxis.  Protonix/sequential compression devices.  8. Decreased O2 saturations with movement.  The patient will be put on      O2 via nasal cannula to keep O2 saturations greater than 90%.      Rufina Falco, M.D.  Electronically Signed     JY/MEDQ  D:  02/05/2007  T:  02/06/2007  Job:  [id number]

## 2010-10-25 NOTE — Assessment & Plan Note (Signed)
[facility]   NAME:  Sean [name], Sean [name] NO.:  [id number]   MEDICAL RECORD NO.:  [id number]      DATE OF BIRTH:  [birth year]   PHYSICIAN:  Maxwell Caul, M.D.      VISIT DATE:                                   OFFICE VISIT   [name] is a gentleman we have been following for a difficult set of  wounds most recently in his right leg, which is of mixed pathogenesis  with some degree of venous stasis, also calciphylaxis in the setting of  chronic renal insufficiency.  Last time I saw him on July 20, 2008.  I was somewhat concerned about the degree of erythema around his distal  wound.  In fact, I put him on doxycycline 100 mg per day and I am seeing  him today in followup.   On examination, his temperature is 97.8, respirations 18, pulse rate 80,  and blood pressure 153/83.  He does not look systemically unwell.  He  has now 2 remaining wounds, 1 just above the knee on the right side and  1 below the knee medially.  Both of these wounds are as usual in the  same state.  He has a thick very adherent eschar covering these wounds.  The last time he was here I prescribed Santyl, however, due to financial  reasons he could not obtain this.  Once again, this underwent a  difficult debridement requiring 2% lidocaine by injection for  anesthesia.  I have recommended continuing the Santyl with moist gauze,  dry dressing, and a light Ace wrap.  The patient can do these dressings  himself and we can see him back in 2 weeks' time.   IMPRESSION:  Mixed pathogenesis right leg wounds, debrided as noted  above.  We will see him back in 2 weeks.  The degree of erythema,  questionably cellulitis around the major wound, almost completely  resolved.  I suspect this will fully resolve with his current  prescription of antibiotics.           ______________________________  Maxwell Caul, M.D.     MGR/MEDQ  D:  07/27/2008  T:  07/27/2008  Job:   [id number]

## 2010-10-25 NOTE — Assessment & Plan Note (Signed)
[facility]   NAME:  Sean [name], Sean [name] NO.:  [id number]   MEDICAL RECORD NO.:  [id number]      DATE OF BIRTH:  20-Dec-1935   PHYSICIAN:  Sean [name], M.D. VISIT DATE:  01/23/2007                                   OFFICE VISIT   Sean [name] is a complicated gentleman who is on dialysis who was  actually cared for in this clinic up to the Spring of 2007.  He has  calciphylaxis and has had multiple resultant wounds on his bilateral  lower extremities at some point.  He tells me that roughly 2 months ago  he noted very painful small open areas on the lateral aspect of his left  leg just below the left knee.  He has been applying topical creams such  as Vaseline and olive oil to the area to keep it moist and wrapping  these as best he can with an ACE wrap.  He saw Dr. Nicholas [name] from  dermatology in the past and he had an area biopsied that did show  calciphylaxis and calcification.  The patient tells me he continues on  hemodialysis since February 2003 and he really has no other significant  complaints.  He finds the area that we have described above in question  to be very painful.   PAST MEDICAL HISTORY:  Please see notes in our chart and [facility] notes.  Most relevantly  he has end-stage renal  disease secondary to diabetes.  He has multiple diabetic complications  including retinopathy, neuropathy and nephropathy.  He has known  coronary disease having a myocardial infarction in 1999.  He has known  peripheral vascular disease, iron-deficiency, gout, secondary  hyperparathyroidism and hyperlipidemia.  He continues to be reasonably  independent at home.   PHYSICAL EXAMINATION:  GENERAL:  The patient was in some discomfort even  before we worked on the area in question.  RESPIRATORY:  Routine dictation.  CARDIAC:  Heart sounds were normal.  He appears to be euvolemic.  ABDOMEN was somewhat protuberant however there are no  masses or  tenderness.  VASCULAR ASSESSMENT:  His peripheral pulses were very difficult to feel.  His left leg would not compress above 300, suggesting calcification of  the vessels.  However, his pencil Doppler was clearly biphasic and was  felt to be safe to initiate compression.   WOUND EXAM:  He has multiple old wounds that healed.  In 2007 we worked  on some wounds on his right posterior leg.  The area in question was  roughly 3 x 3 inches, almost circular in the lateral aspect of his left  knee.  There, there were seven small areas that we worked on.  These  were covered with an adherent eschar.  We injected lidocaine to all of  these wounds and debrided with a #15 blade.  Hemostasis was with direct  pressure.  I should note that it was very difficult even to inject these  with lidocaine due to discomfort.  I was surprised at the degree of pain  that he describes even though the areas themselves seem fairly tiny and  innocuous.  There is a fair amount of edema surrounding all these tiny  wounds, however no obvious  infection is present.   IMPRESSION:  Probable recurrent calciphylaxis.  The patient describes  this type of wound before indeed description of this area seems similar  to what we have dealt with before in these clinics.  All of these wounds  were debrided, however several of them continued to have an adherent  eschar.  We applied a small amount of Accuzyme to these wounds with  protective barrier cream around.  Although I considered allowing him to  ACE wrap this and change daily, I really do not think he will be able to  get adequate compression.  We therefore applied a PRAFO.  The patient  seemed to tolerate this well.   We will follow him again  in the early part of next week.  This is a  difficult clinical issue and I think the pain and discomfort in this  area will be a difficult issue as well.  We did manage to heal similar  wounds on the right leg last year  however.           ______________________________  Sean [name], M.D.     MGR/MEDQ  D:  01/23/2007  T:  01/24/2007  Job:  [id number]

## 2010-10-25 NOTE — Assessment & Plan Note (Signed)
[facility]   NAME:  WLLIAM, GROSSO             ACCOUNT NO.:  [id number]   MEDICAL RECORD NO.:  [id number]      DATE OF BIRTH:  1935-08-08   PHYSICIAN:  Leonie Man, M.D.    VISIT DATE:  09/07/2008                                   OFFICE VISIT   PROBLEM:  Diabetic black male with end-stage renal disease on  hemodialysis with 2 ulcers, one on the right anterior lower extremity  measuring 2.5 x 1.3 x less than 0.1 cm and second ulcer on the right  lateral knee measuring 0.8 x 1.3 x 0.3 cm.  The patient has had multiple  leg ulcers in the past attributed to calciphylaxis.   His current therapy is with Santyl wet-to-dry and done 3 times a week by  the visiting nurse service.   On examination, the patient is afebrile with temperature of 98.3, pulse  80, respirations 20, blood pressure 117/77.  He is in no acute distress.   Wound #1 is well epithelialized and healing but is not pigmented.  This  is a healed wound, however.   Wound #2 shows an eschar with subeschar purulence and a necrotic base.   Treatment today was of wound #2 only, use 1% lidocaine plain for an  injectable field block.  I did a selective debridement by removing the  eschar and carrying the dissection down into the base of the wound.  I  excised the wound edges to saucerize the wound.  I treated this with  Hydrogel, which will be done 3 times weekly by the visiting nurse  service, and I will see the patient back in 1 week.      Leonie Man, M.D.  Electronically Signed     PB/MEDQ  D:  09/07/2008  T:  09/08/2008  Job:  [id number]

## 2010-10-25 NOTE — Assessment & Plan Note (Signed)
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  He has had good results with this graft.  It was initially placed in 2003.  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  He presents now with a 2-3 week history of an enlarging false  aneurysm on the medial venous limb of his forearm loop graft.  He has an  excellent thrill through the graft.  He does have some slight tenderness  over this area.  I discussed options with the patient.  I do not feel it  is safe to continue to observe this only since he does have an  enlargement and tenderness of this acute false aneurysm.  I explained  the option of excising just this area but this is in an area of a  multitude of prior sticks and do not feel that he would have adequate  graft to sew to.  I have recommended replacement of the medial venous  limb of his graft.  I explained that he would have to stick the arterial  limb only for 3 weeks and then could access both limbs with a graft.  We  will plan this as an outpatient on a non-dialysis day on 03/11/2007 at  [facility].   Larina Earthly, M.D.  Electronically Signed   TFE/MEDQ  D:  03/01/2007  T:  03/04/2007  Job:  [id number]   cc:   [facility]

## 2010-10-25 NOTE — Assessment & Plan Note (Signed)
[facility]   NAME:  CHESNEY, KLIMASZEWSKI NO.:  [id number]   MEDICAL RECORD NO.:  [id number]      DATE OF BIRTH:  07/24/1935   PHYSICIAN:  Maxwell Caul, M.D.      VISIT DATE:                                   OFFICE VISIT   Mr. Rash is a gentleman we have been following for a difficult set of  wound.  Most recently on his right leg which are of mixed pathogenesis  was some degree of venous stasis, also calciphylaxis in the setting of  chronic renal insufficiency.  These wounds usually have a thick adherent  eschar and some slough, and he tolerates debridement generally very  poorly because of pain.   On examination, his temperature is 97.8.  He has 2 wounds, 1 on the  right anterior lower extremity which we have labeled #13 and 1 on the  lateral aspect above his right knee.  Again, we applied LET and used 2%  lidocaine.  He tolerated the debridement better than usual.  The more  distal of these wounds #13 was able to be debrided down to a reasonably  clean base.  The area above his knee had a thick adherent eschar.  This  was only partially debrided.   IMPRESSION:  Mixed pathogenesis, right leg wounds.  We will continue  with Santyl, moist gauze, dry dressing, and Ace wrap which the patient  is doing at home himself.  There is no current evidence of infection  around the wounds today.           ______________________________  Maxwell Caul, M.D.     MGR/MEDQ  D:  08/10/2008  T:  08/10/2008  Job:  [id number]

## 2010-10-25 NOTE — Assessment & Plan Note (Signed)
[facility]   NAME:  JOSEJUAN, HOAGLIN NO.:  [id number]   MEDICAL RECORD NO.:  [id number]      DATE OF BIRTH:  [birth year]   PHYSICIAN:  Maxwell Caul, M.D.      VISIT DATE:                                   OFFICE VISIT   LOCATION:  [facility].   Mr. Fantroy is a gentleman we have been following for a difficult set of  wounds in the setting of significant lower extremity calciphylaxis,  chronic renal insufficiency, and venous stasis.  He has been washing his  remaining wounds daily, applying Polysporin and bandaging.  He returns  today in followup.   On examination, his temperature is 98.1.  He really has only 2 open  areas now which are new on the right leg and a previously noted one on  his left anterior lower extremity.  The one on the left anterior lower  extremity is largely epithelialized over and did not require  debridement.  Unfortunately, he has 2 on his right leg, one just under  his tibial tuberosity and one medial on the upper right calf area.  Both  of these underwent difficult debridements to remove adherent callus.  In  the past, the pain here has not been well controlled even with  injectable lidocaine.  This was no exception.  Nevertheless, underneath  this, the wound bed appears fairly well granulated and when this  aggressive approach is taken these wounds generally heal.   IMPRESSION:  Two new wounds on the right leg.  These wounds were  debrided as described above.  I will try to keep the situation simple  with antibacterial soap washes, Polysporin, dry dressing, and an Ace  wrap.  The one on the left anterior lower extremity is epithelialized  over.  At this point, I do not think he needs any specific dressings.  Ultimately, if I can get these wounds to heal, I think he may be a good  candidate for Tubigrip stockings and/or light compression stockings.           ______________________________  Maxwell Caul, M.D.     MGR/MEDQ  D:  06/29/2008  T:  06/30/2008  Job:  [id number]

## 2010-10-28 NOTE — Consult Note (Signed)
NAME:  Sean [name], Sean [name] NO.:  [id number]   MEDICAL RECORD NO.:  [id number]           PATIENT TYPE:   LOCATION:                                 FACILITY:   PHYSICIAN:  Theresia Majors. Tanda Rockers, M.D.     DATE OF BIRTH:   DATE OF CONSULTATION:  11/28/2004  DATE OF DISCHARGE:                                   CONSULTATION   REASON FOR CONSULTATION:  This 75 year old man with end-stage renal disease  on hemodialysis is referred for evaluation of bilateral lower extremity  ulcerations.   REFERRING PHYSICIAN:  Dr. Nicholas Lose.   NEPHROLOGIST:  Dr. Arrie Aran.   IMPRESSION:  Calciphylaxis.   RECOMMENDATIONS:  Local wound care to avoid ulceration or abscess and to  control malodor.   SUBJECTIVE:  This 75 year old man has had a complex medical history  involving hypertension, type 2 diabetes, and renal insufficiency which has  progressed to frank renal failure. He has been on hemodialysis for over a  year and has tolerated his dialysis well. His ulcers in the lower  extremities have progressed over this time. He has had a recent biopsy in  April which showed changes consistent with calciphylaxis.   PAST MEDICAL HISTORY:  Remarkable for severe pain related to his current  lesions requiring narcotic analgesia. His medication list is not contained  in the faxed documents. He specifically denies major abdominal or thoracic  surgery.   FAMILY HISTORY:  Is positive for hypertension, diabetes, stroke, and heart  attack.   SOCIAL HISTORY:  He is married and he lives in [place]. He has children  which live remotely.   REVIEW OF SYSTEMS:  Is positive in areas related to the current sequelae of  renal failure. He specifically denies angina pectoris. He does have some  shortness of breath that varies with his fluid and electrolyte balance. He  denies paralysis consistent with TIAs. He has had episodes of documented  hypoglycemia. The remainder of his review of systems is  negative.   PHYSICAL EXAMINATION:  GENERAL:  He is alert, oriented, appears in some  moderate distress, complaining of pain in his lower extremities.  HEENT:  Exam is clear.  NECK:  Supple. Trachea is midline. Thyroid is nonpalpable.  LUNGS:  Clear.  ABDOMEN:  Soft.  LOWER EXTREMITY EXAM:  Remarkable for +2 edema with very painful darkened  eschars which are dry and nondraining (photographs were taken during the  clinic of all these ulcers). Neurologically, the patient retains protective  sensation. There are faint pedal pulses bilaterally.   DISCUSSION:  We have explained the nature of the patient's malignant  calcifications in terms that he seems to understand. We have indicated to  him that no specific surgical intervention is indicated at this time, and  the stabilization of the process will be proportional to the effectiveness  of his dialysis and overall management of his renal insults. Nevertheless,  we have expressed a willingness to see [facility] in [facility] to maintain  the health of these ulcers as they resolve. We anticipate that he will have  the need for frequent dressing  changes as well as irrigations to avoid  secondary infection. At this point I feel that no intervention is indicated  other than to apply a nonocclusive dressing to avoid further trauma, and to  maintain a semi-moist environment for the autolysis of the eschar. The  patient seems to understand. We will see him on a p.r.n. basis.           ______________________________  Theresia Majors Tanda Rockers, M.D.     Sean [name]  D:  12/08/2004  T:  12/08/2004  Job:  [id number]   cc:   Dr. Kristen Cardinal, M.D.  [address]  [place]  [place] [address]  Fax: [phone]

## 2010-12-16 ENCOUNTER — Other Ambulatory Visit (HOSPITAL_COMMUNITY): Payer: Self-pay | Admitting: Nephrology

## 2010-12-16 DIAGNOSIS — N186 End stage renal disease: Secondary | ICD-10-CM

## 2010-12-20 ENCOUNTER — Ambulatory Visit (HOSPITAL_COMMUNITY)
Admission: RE | Admit: 2010-12-20 | Discharge: 2010-12-20 | Disposition: A | Discharge status: Home / Self Care | Payer: Medicare Other | Source: Ambulatory Visit | Attending: Nephrology | Admitting: Nephrology

## 2010-12-20 DIAGNOSIS — Z8673 Personal history of transient ischemic attack (TIA), and cerebral infarction without residual deficits: Secondary | ICD-10-CM | POA: Insufficient documentation

## 2010-12-20 DIAGNOSIS — N058 Unspecified nephritic syndrome with other morphologic changes: Secondary | ICD-10-CM | POA: Insufficient documentation

## 2010-12-20 DIAGNOSIS — I252 Old myocardial infarction: Secondary | ICD-10-CM | POA: Insufficient documentation

## 2010-12-20 DIAGNOSIS — I7090 Unspecified atherosclerosis: Secondary | ICD-10-CM | POA: Insufficient documentation

## 2010-12-20 DIAGNOSIS — M109 Gout, unspecified: Secondary | ICD-10-CM | POA: Insufficient documentation

## 2010-12-20 DIAGNOSIS — N186 End stage renal disease: Secondary | ICD-10-CM | POA: Insufficient documentation

## 2010-12-20 DIAGNOSIS — E1142 Type 2 diabetes mellitus with diabetic polyneuropathy: Secondary | ICD-10-CM | POA: Insufficient documentation

## 2010-12-20 DIAGNOSIS — E1139 Type 2 diabetes mellitus with other diabetic ophthalmic complication: Secondary | ICD-10-CM | POA: Insufficient documentation

## 2010-12-20 DIAGNOSIS — Y832 Surgical operation with anastomosis, bypass or graft as the cause of abnormal reaction of the patient, or of later complication, without mention of misadventure at the time of the procedure: Secondary | ICD-10-CM | POA: Insufficient documentation

## 2010-12-20 DIAGNOSIS — E11319 Type 2 diabetes mellitus with unspecified diabetic retinopathy without macular edema: Secondary | ICD-10-CM | POA: Insufficient documentation

## 2010-12-20 DIAGNOSIS — E1149 Type 2 diabetes mellitus with other diabetic neurological complication: Secondary | ICD-10-CM | POA: Insufficient documentation

## 2010-12-20 DIAGNOSIS — E1129 Type 2 diabetes mellitus with other diabetic kidney complication: Secondary | ICD-10-CM | POA: Insufficient documentation

## 2010-12-20 DIAGNOSIS — I739 Peripheral vascular disease, unspecified: Secondary | ICD-10-CM | POA: Insufficient documentation

## 2010-12-20 DIAGNOSIS — T82898A Other specified complication of vascular prosthetic devices, implants and grafts, initial encounter: Secondary | ICD-10-CM | POA: Insufficient documentation

## 2010-12-20 MED ORDER — IOHEXOL 300 MG/ML  SOLN
100.0000 mL | Freq: Once | INTRAMUSCULAR | Status: AC | PRN
  Administered 2010-12-20: 32 mL via INTRAVENOUS

## 2011-03-23 LAB — POCT I-STAT 4, (NA,K, GLUC, HGB,HCT)
HCT: 40
Hemoglobin: 13.6
Operator id: 181601
Sodium: 142

## 2011-03-24 LAB — RENAL FUNCTION PANEL
Albumin: 2.7 — ABNORMAL LOW
BUN: 39 — ABNORMAL HIGH
BUN: 62 — ABNORMAL HIGH
CO2: 27
CO2: 32
Calcium: 8.6
Chloride: 96
Creatinine, Ser: 7.39 — ABNORMAL HIGH
Glucose, Bld: 106 — ABNORMAL HIGH
Glucose, Bld: 96
Potassium: 4.6
Sodium: 130 — ABNORMAL LOW

## 2011-03-24 LAB — COMPREHENSIVE METABOLIC PANEL
ALT: 19
Alkaline Phosphatase: 58
BUN: 21
CO2: 30
GFR calc non Af Amer: 9 — ABNORMAL LOW
Glucose, Bld: 135 — ABNORMAL HIGH
Potassium: 4
Sodium: 139

## 2011-03-24 LAB — CBC
HCT: 34.5 — ABNORMAL LOW
HCT: 35.3 — ABNORMAL LOW
HCT: 37.4 — ABNORMAL LOW
HCT: 37.8 — ABNORMAL LOW
Hemoglobin: 12.1 — ABNORMAL LOW
Hemoglobin: 12.9 — ABNORMAL LOW
MCHC: 33.4
MCHC: 33.5
MCHC: 34.1
MCHC: 34.1
MCV: 85.1
MCV: 85.2
Platelets: 194
RBC: 3.79 — ABNORMAL LOW
RBC: 4.05 — ABNORMAL LOW
RBC: 4.37
RDW: 15.9 — ABNORMAL HIGH
WBC: 5.6
WBC: 7.4

## 2011-03-24 LAB — CULTURE, BLOOD (ROUTINE X 2): Culture: NO GROWTH

## 2011-03-24 LAB — CULTURE, RESPIRATORY W GRAM STAIN

## 2011-03-24 LAB — BASIC METABOLIC PANEL
BUN: 38 — ABNORMAL HIGH
CO2: 27
Chloride: 93 — ABNORMAL LOW
Creatinine, Ser: 7.81 — ABNORMAL HIGH

## 2011-03-24 LAB — DIFFERENTIAL
Basophils Absolute: 0
Basophils Relative: 0
Eosinophils Absolute: 0
Eosinophils Relative: 0
Neutrophils Relative %: 85 — ABNORMAL HIGH

## 2011-03-24 LAB — PHOSPHORUS: Phosphorus: 3.3

## 2011-08-10 ENCOUNTER — Ambulatory Visit (INDEPENDENT_AMBULATORY_CARE_PROVIDER_SITE_OTHER): Payer: Medicare Other | Admitting: General Surgery

## 2011-08-10 ENCOUNTER — Encounter (INDEPENDENT_AMBULATORY_CARE_PROVIDER_SITE_OTHER): Payer: Self-pay | Admitting: General Surgery

## 2011-08-10 VITALS — BP 130/70 | HR 94 | Temp 97.1°F | Ht 65.5 in | Wt 231.6 lb

## 2011-08-10 DIAGNOSIS — N186 End stage renal disease: Secondary | ICD-10-CM

## 2011-08-10 NOTE — Progress Notes (Signed)
Subjective:   Desire for peritoneal dialysis  Patient ID: Sean [name], male   DOB: August 07, 1935, 76 y.o.   MRN: [id number]  HPI Patient is a 76 year old male referred by Dr. Arrie Aran for consideration for placement of a peritoneal dialysis catheter. The patient has been on hemodialysis for about 10 years. He is having difficulty tolerating hemodialysis mentally and emotionally and strongly desires to be able to stay at home. He has been through initial training. He currently is feeling relatively well. He has a history of partial colectomy for ischemia but has no abdominal or wound complaints currently.  Past Medical History  Diagnosis Date  . Hyperparathyroidism   . Arthritis   . Diabetes mellitus   . Hyperlipidemia   . Hypertension   . Chronic kidney disease   . Neuromuscular disorder   . Stroke   . Heart attack    Past Surgical History  Procedure Date  . Colon surgery    Current Outpatient Prescriptions  Medication Sig Dispense Refill  . amLODipine (NORVASC) 10 MG tablet Take 10 mg by mouth daily.       Allergies  Allergen Reactions  . Benazepril Hcl     REACTION: hives   History  Substance Use Topics  . Smoking status: Former Games developer  . Smokeless tobacco: Former Neurosurgeon    Quit date: 08/09/1976  . Alcohol Use: Yes    Review of Systems  Constitutional: Negative for fever and chills.  Respiratory: Negative.   Cardiovascular: Negative.   Gastrointestinal: Negative.   Psychiatric/Behavioral: The patient is nervous/anxious.        Objective:   Physical Exam BP 130/70  Pulse 94  Temp(Src) 97.1 F (36.2 C) (Temporal)  Ht 5' 5.5" (1.664 m)  Wt 231 lb 9.6 oz (105.053 kg)  BMI 37.95 kg/m2  SpO2 95% General: Obese African American male in no distress Skin: Multiple old healed wounds lower extremities Lymph nodes: No cervical, subclavicular, and no nodes palpable HEENT: No palpable masses. Sclera nonicteric. Lungs: Clear without wheezing or increased work of  breathing Cardiovascular: Regular rate and rhythm without murmur. No edema. No palpable pedal pulses but extremities are warm Abdomen: Obese, healed low midline incision without apparent hernia. Soft and nontender. No organomegaly. No inguinal or umbilical hernias. Extremities: Somewhat atrophic with multiple healed wounds lower extremities Neurologic: Alert and fully oriented. Gait normal.   Assessment:     76 year old male with end-stage renal disease on hemodialysis. He strongly desires peritoneal dialysis. On talking with him he feels he is really going to be unable to tolerate continued hemodialysis. He does have some difficulties contemplating peritoneal dialysis in that he is morbidly obese and has had previous extensive abdominal surgery. We discussed these issues as well as other potential problems with the catheter including use of a general anesthetic and risks of catheter malfunction, displacement, infection, peritonitis and others. He understands and would like to proceed and I think it is reasonable to give it a try. We'll obtain cardiac clearance initially.    Plan:     We'll obtain cardiac clearance from Dr. Katrinka Blazing and then plan to proceed with laparoscopic placement of peritoneal dialysis catheter under general anesthesia with overnight hospitalization .

## 2011-08-10 NOTE — Patient Instructions (Signed)
Our office will call you to schedule surgery. Call us if you have not heard anything in about 2 weeks.

## 2011-08-16 ENCOUNTER — Encounter (INDEPENDENT_AMBULATORY_CARE_PROVIDER_SITE_OTHER): Payer: Self-pay

## 2011-08-18 ENCOUNTER — Telehealth (INDEPENDENT_AMBULATORY_CARE_PROVIDER_SITE_OTHER): Payer: Self-pay

## 2011-08-18 NOTE — Telephone Encounter (Signed)
[facility] cardiology called and patient has office visit with Dr Verdis Prime April 18 or cardiac clearance. Patient can only go on Tue and Clovis Cao so office visit pushed back that far due to the conflict in schedules. He is on a cancellation list.

## 2011-09-01 ENCOUNTER — Encounter (HOSPITAL_COMMUNITY): Payer: Self-pay | Admitting: Pharmacy Technician

## 2011-09-04 ENCOUNTER — Other Ambulatory Visit (HOSPITAL_COMMUNITY): Payer: Self-pay | Admitting: *Deleted

## 2011-09-04 ENCOUNTER — Other Ambulatory Visit (INDEPENDENT_AMBULATORY_CARE_PROVIDER_SITE_OTHER): Payer: Self-pay | Admitting: General Surgery

## 2011-09-05 ENCOUNTER — Encounter (HOSPITAL_COMMUNITY)
Admission: RE | Admit: 2011-09-05 | Discharge: 2011-09-05 | Disposition: A | Discharge status: Home / Self Care | Payer: Medicare Other | Source: Ambulatory Visit | Attending: General Surgery | Admitting: General Surgery

## 2011-09-05 ENCOUNTER — Encounter (HOSPITAL_COMMUNITY): Payer: Self-pay

## 2011-09-05 ENCOUNTER — Ambulatory Visit (HOSPITAL_COMMUNITY)
Admission: RE | Admit: 2011-09-05 | Discharge: 2011-09-05 | Disposition: A | Discharge status: Home / Self Care | Payer: Medicare Other | Source: Ambulatory Visit | Attending: Anesthesiology | Admitting: Anesthesiology

## 2011-09-05 DIAGNOSIS — Z01812 Encounter for preprocedural laboratory examination: Secondary | ICD-10-CM | POA: Insufficient documentation

## 2011-09-05 DIAGNOSIS — I700 Atherosclerosis of aorta: Secondary | ICD-10-CM | POA: Insufficient documentation

## 2011-09-05 DIAGNOSIS — Z01818 Encounter for other preprocedural examination: Secondary | ICD-10-CM | POA: Insufficient documentation

## 2011-09-05 HISTORY — DX: Gastro-esophageal reflux disease without esophagitis: K21.9

## 2011-09-05 LAB — SURGICAL PCR SCREEN
MRSA, PCR: NEGATIVE
Staphylococcus aureus: NEGATIVE

## 2011-09-05 MED ORDER — CHLORHEXIDINE GLUCONATE 4 % EX LIQD
1.0000 "application " | Freq: Once | CUTANEOUS | Status: DC
  Filled 2011-09-05: qty 15

## 2011-09-05 NOTE — Pre-Procedure Instructions (Signed)
20 JAIMES ECKERT  09/05/2011   Your procedure is scheduled on:  09/12/2011  Report to [facility] at 1030 am AM.  Call this number if you have problems the morning of surgery: [phone]   Remember:   Do not eat food:After Midnight.  May have clear liquids: up to 4 Hours before arrival.  Do not drink any liquid after 0430 am the day of surgery  Clear liquids include soda, tea, black coffee, apple or grape juice, broth.  Take these medicines the morning of surgery with A SIP OF WATER: amlodipine  atarax   Do not wear jewelry, make-up or nail polish.  Do not wear lotions, powders, or perfumes. You may wear deodorant.  Do not shave 48 hours prior to surgery.  Do not bring valuables to the hospital.  Contacts, dentures or bridgework may not be worn into surgery.  Leave suitcase in the car. After surgery it may be brought to your room.  For patients admitted to the hospital, checkout time is 11:00 AM the day of discharge.   Patients discharged the day of surgery will not be allowed to drive home.  Name and phone number of your driver: son/ daughter  Special Instructions: CHG Shower Use Special Wash: 1/2 bottle night before surgery and 1/2 bottle morning of surgery.   Please read over the following fact sheets that you were given: Pain Booklet, Coughing and Deep Breathing, MRSA Information and Surgical Site Infection Prevention

## 2011-09-05 NOTE — Progress Notes (Signed)
Left request for ov ekg  Test (echo) ...most recent  At [phone].Marland KitchenMarland Kitchen

## 2011-09-06 NOTE — Progress Notes (Signed)
Information received  From Dr Verdis Prime ... Ov-ekg-stress test-echo

## 2011-09-06 NOTE — Consult Note (Signed)
Anesthesia:  Patient is a 76 year old male scheduled for a laparoscopic PD catheter insertion on 09/12/11.  His history includes ESRD currently on HD, CAD/NSTEMI 06/2009 with known high grade 80%-90% RCA stenosis (with failed angioplasty attempt), diastolic heart failure (class III), HTN, depression, parathyroidectomy, partial colectomy, CVA, headaches, GERD, DM (recorded on history, but no DM medications listed), former smoker.  His Cardiologist is Dr. Verdis Prime who saw him for preoperative clearance on 08/22/11.  EKG then showed SR, old inferior infarct, poor r wave progression, consider old anterior infarct, nonspecific T wave abnormality.  He was felt to be at low to moderate risk.  He did perform an echocardiogram on 08/24/11 that showed moderate concentric LVH, EF 55-60%, normal LA size, trace AR, trivial TR.    Cardiac cath findings on 06/28/09 showed: severe stenosis (80-90%)in the RCA, proximal/mid long, lesion; otherwise moderate diffuse disease throughout the arterial distribution on the left side, normal LV EF 75%, no AS or significant MR detected.  Later that same day Dr.Varanasi attempted balloon angioplasty with no significant change in the stenosis.  He felt poor guide support would prevent any type of stent implantation.    CXR on 09/05/11 showed no acute cardiopulmonary abnormalities.  Labs were not done at his PAT appointment, so he will need those done on arrival.

## 2011-09-11 MED ORDER — CEFAZOLIN SODIUM-DEXTROSE 2-3 GM-% IV SOLR
2.0000 g | INTRAVENOUS | Status: AC
  Administered 2011-09-12: 2 g via INTRAVENOUS
  Filled 2011-09-11: qty 50

## 2011-09-12 ENCOUNTER — Ambulatory Visit (HOSPITAL_COMMUNITY): Payer: Medicare Other | Admitting: Vascular Surgery

## 2011-09-12 ENCOUNTER — Encounter (HOSPITAL_COMMUNITY): Payer: Self-pay | Admitting: Vascular Surgery

## 2011-09-12 ENCOUNTER — Ambulatory Visit (HOSPITAL_COMMUNITY)
Admission: RE | Admit: 2011-09-12 | Discharge: 2011-09-13 | Disposition: A | Discharge status: Home / Self Care | Payer: Medicare Other | Source: Ambulatory Visit | Attending: General Surgery | Admitting: General Surgery

## 2011-09-12 ENCOUNTER — Encounter (HOSPITAL_COMMUNITY): Payer: Self-pay | Admitting: *Deleted

## 2011-09-12 ENCOUNTER — Encounter (HOSPITAL_COMMUNITY)
Admission: RE | Disposition: A | Discharge status: Home / Self Care | Payer: Self-pay | Source: Ambulatory Visit | Attending: General Surgery

## 2011-09-12 DIAGNOSIS — I120 Hypertensive chronic kidney disease with stage 5 chronic kidney disease or end stage renal disease: Secondary | ICD-10-CM | POA: Insufficient documentation

## 2011-09-12 DIAGNOSIS — N186 End stage renal disease: Secondary | ICD-10-CM

## 2011-09-12 DIAGNOSIS — E119 Type 2 diabetes mellitus without complications: Secondary | ICD-10-CM | POA: Insufficient documentation

## 2011-09-12 DIAGNOSIS — Z8673 Personal history of transient ischemic attack (TIA), and cerebral infarction without residual deficits: Secondary | ICD-10-CM | POA: Insufficient documentation

## 2011-09-12 DIAGNOSIS — Z4902 Encounter for fitting and adjustment of peritoneal dialysis catheter: Secondary | ICD-10-CM | POA: Insufficient documentation

## 2011-09-12 HISTORY — PX: CAPD INSERTION: SHX5233

## 2011-09-12 LAB — CBC
MCH: 28.3 pg (ref 26.0–34.0)
MCHC: 33.2 g/dL (ref 30.0–36.0)
MCV: 85.2 fL (ref 78.0–100.0)
Platelets: 157 10*3/uL (ref 150–400)
RBC: 4.45 MIL/uL (ref 4.22–5.81)

## 2011-09-12 LAB — BASIC METABOLIC PANEL
BUN: 48 mg/dL — ABNORMAL HIGH (ref 6–23)
CO2: 28 mEq/L (ref 19–32)
Calcium: 10.8 mg/dL — ABNORMAL HIGH (ref 8.4–10.5)
Calcium: 9.8 mg/dL (ref 8.4–10.5)
GFR calc non Af Amer: 6 mL/min — ABNORMAL LOW (ref >90)
GFR calc non Af Amer: 5 mL/min — ABNORMAL LOW (ref >90)
Glucose, Bld: 86 mg/dL (ref 70–99)
Sodium: 135 mEq/L (ref 135–145)

## 2011-09-12 LAB — GLUCOSE, CAPILLARY: Glucose-Capillary: 74 mg/dL (ref 70–99)

## 2011-09-12 SURGERY — LAPAROSCOPIC INSERTION CONTINUOUS AMBULATORY PERITONEAL DIALYSIS  (CAPD) CATHETER
Anesthesia: General | Site: Abdomen | Wound class: Clean Contaminated

## 2011-09-12 MED ORDER — METOPROLOL TARTRATE 1 MG/ML IV SOLN
INTRAVENOUS | Status: DC | PRN
  Administered 2011-09-12: 2 mg via INTRAVENOUS
  Administered 2011-09-12 (×3): 1 mg via INTRAVENOUS

## 2011-09-12 MED ORDER — FENTANYL CITRATE 0.05 MG/ML IJ SOLN
INTRAMUSCULAR | Status: DC | PRN
  Administered 2011-09-12: 25 ug via INTRAVENOUS
  Administered 2011-09-12 (×2): 50 ug via INTRAVENOUS
  Administered 2011-09-12 (×3): 25 ug via INTRAVENOUS

## 2011-09-12 MED ORDER — DEXTROSE 50 % IV SOLN
INTRAVENOUS | Status: DC | PRN
  Administered 2011-09-12: 12.5 g via INTRAVENOUS

## 2011-09-12 MED ORDER — LIDOCAINE HCL (PF) 1 % IJ SOLN: 5.0000 mL | INTRAMUSCULAR | Status: DC | PRN

## 2011-09-12 MED ORDER — MORPHINE SULFATE 2 MG/ML IJ SOLN: 2.0000 mg | INTRAMUSCULAR | Status: DC | PRN

## 2011-09-12 MED ORDER — CALCIUM CARBONATE 1250 MG/5ML PO SUSP
500.0000 mg | Freq: Four times a day (QID) | ORAL | Status: DC | PRN
  Filled 2011-09-12: qty 5

## 2011-09-12 MED ORDER — ZOLPIDEM TARTRATE 5 MG PO TABS: 5.0000 mg | ORAL_TABLET | Freq: Every evening | ORAL | Status: DC | PRN

## 2011-09-12 MED ORDER — CAMPHOR-MENTHOL 0.5-0.5 % EX LOTN
1.0000 "application " | TOPICAL_LOTION | Freq: Three times a day (TID) | CUTANEOUS | Status: DC | PRN
  Filled 2011-09-12: qty 222

## 2011-09-12 MED ORDER — SODIUM CHLORIDE 0.9 % IV SOLN: 100.0000 mL | INTRAVENOUS | Status: DC | PRN

## 2011-09-12 MED ORDER — SODIUM CHLORIDE 0.9 % IV SOLN
INTRAVENOUS | Status: DC | PRN
  Administered 2011-09-12: 13:00:00 via INTRAVENOUS

## 2011-09-12 MED ORDER — NEOSTIGMINE METHYLSULFATE 1 MG/ML IJ SOLN
INTRAMUSCULAR | Status: DC | PRN
  Administered 2011-09-12: 4 mg via INTRAVENOUS

## 2011-09-12 MED ORDER — PHENYLEPHRINE HCL 10 MG/ML IJ SOLN
INTRAMUSCULAR | Status: DC | PRN
  Administered 2011-09-12 (×3): 40 ug via INTRAVENOUS

## 2011-09-12 MED ORDER — LIDOCAINE HCL (CARDIAC) 20 MG/ML IV SOLN
INTRAVENOUS | Status: DC | PRN
  Administered 2011-09-12: 50 mg via INTRAVENOUS

## 2011-09-12 MED ORDER — DROPERIDOL 2.5 MG/ML IJ SOLN: 0.6250 mg | INTRAMUSCULAR | Status: DC | PRN

## 2011-09-12 MED ORDER — ONDANSETRON HCL 4 MG/2ML IJ SOLN: 4.0000 mg | Freq: Four times a day (QID) | INTRAMUSCULAR | Status: DC | PRN

## 2011-09-12 MED ORDER — PARICALCITOL 5 MCG/ML IV SOLN
7.0000 ug | INTRAVENOUS | Status: DC
  Administered 2011-09-13: 7 ug via INTRAVENOUS
  Filled 2011-09-12: qty 1.4

## 2011-09-12 MED ORDER — ACETAMINOPHEN 650 MG RE SUPP: 650.0000 mg | Freq: Four times a day (QID) | RECTAL | Status: DC | PRN

## 2011-09-12 MED ORDER — PENTAFLUOROPROP-TETRAFLUOROETH EX AERO: 1.0000 "application " | INHALATION_SPRAY | CUTANEOUS | Status: DC | PRN

## 2011-09-12 MED ORDER — ROCURONIUM BROMIDE 100 MG/10ML IV SOLN
INTRAVENOUS | Status: DC | PRN
  Administered 2011-09-12: 50 mg via INTRAVENOUS

## 2011-09-12 MED ORDER — SORBITOL 70 % SOLN: 30.0000 mL | Status: DC | PRN

## 2011-09-12 MED ORDER — ARTIFICIAL TEARS OP OINT
TOPICAL_OINTMENT | OPHTHALMIC | Status: DC | PRN
  Administered 2011-09-12: 1 via OPHTHALMIC

## 2011-09-12 MED ORDER — BUPIVACAINE-EPINEPHRINE 0.25% -1:200000 IJ SOLN
INTRAMUSCULAR | Status: DC | PRN
  Administered 2011-09-12: 1 mL

## 2011-09-12 MED ORDER — ONDANSETRON HCL 4 MG PO TABS: 4.0000 mg | ORAL_TABLET | Freq: Four times a day (QID) | ORAL | Status: DC | PRN

## 2011-09-12 MED ORDER — HYDROXYZINE HCL 25 MG PO TABS: 25.0000 mg | ORAL_TABLET | Freq: Three times a day (TID) | ORAL | Status: DC | PRN

## 2011-09-12 MED ORDER — AMLODIPINE BESYLATE 10 MG PO TABS
10.0000 mg | ORAL_TABLET | Freq: Every day | ORAL | Status: DC
  Administered 2011-09-12 – 2011-09-13 (×2): 10 mg via ORAL
  Filled 2011-09-12 (×2): qty 1

## 2011-09-12 MED ORDER — HYDROCODONE-ACETAMINOPHEN 5-325 MG PO TABS: 1.0000 | ORAL_TABLET | ORAL | Status: DC | PRN

## 2011-09-12 MED ORDER — BACITRACIN ZINC 500 UNIT/GM EX OINT
TOPICAL_OINTMENT | CUTANEOUS | Status: DC | PRN
  Administered 2011-09-12: 1 via TOPICAL

## 2011-09-12 MED ORDER — 0.9 % SODIUM CHLORIDE (POUR BTL) OPTIME
TOPICAL | Status: DC | PRN
  Administered 2011-09-12: 1000 mL

## 2011-09-12 MED ORDER — PROPOFOL 10 MG/ML IV EMUL
INTRAVENOUS | Status: DC | PRN
  Administered 2011-09-12: 200 mg via INTRAVENOUS

## 2011-09-12 MED ORDER — ACETAMINOPHEN 325 MG PO TABS: 650.0000 mg | ORAL_TABLET | Freq: Four times a day (QID) | ORAL | Status: DC | PRN

## 2011-09-12 MED ORDER — ONDANSETRON HCL 4 MG/2ML IJ SOLN
INTRAMUSCULAR | Status: DC | PRN
  Administered 2011-09-12: 4 mg via INTRAVENOUS

## 2011-09-12 MED ORDER — HYDROXYZINE HCL 10 MG PO TABS
10.0000 mg | ORAL_TABLET | Freq: Every evening | ORAL | Status: DC | PRN
  Filled 2011-09-12: qty 1

## 2011-09-12 MED ORDER — NEPRO/CARBSTEADY PO LIQD: 237.0000 mL | ORAL | Status: DC | PRN

## 2011-09-12 MED ORDER — LORATADINE 10 MG PO TABS
10.0000 mg | ORAL_TABLET | Freq: Every day | ORAL | Status: DC
  Administered 2011-09-12 – 2011-09-13 (×2): 10 mg via ORAL
  Filled 2011-09-12 (×2): qty 1

## 2011-09-12 MED ORDER — GLYCOPYRROLATE 0.2 MG/ML IJ SOLN
INTRAMUSCULAR | Status: DC | PRN
  Administered 2011-09-12: .6 mg via INTRAVENOUS

## 2011-09-12 MED ORDER — SODIUM CHLORIDE 0.9 % IV SOLN
INTRAVENOUS | Status: DC
  Administered 2011-09-12: 18:00:00 via INTRAVENOUS

## 2011-09-12 MED ORDER — DOCUSATE SODIUM 283 MG RE ENEM: 1.0000 | ENEMA | RECTAL | Status: DC | PRN

## 2011-09-12 MED ORDER — LIDOCAINE-PRILOCAINE 2.5-2.5 % EX CREA: 1.0000 "application " | TOPICAL_CREAM | CUTANEOUS | Status: DC | PRN

## 2011-09-12 MED ORDER — ALTEPLASE 2 MG IJ SOLR
2.0000 mg | Freq: Once | INTRAMUSCULAR | Status: AC | PRN
  Filled 2011-09-12: qty 2

## 2011-09-12 MED ORDER — HYDROMORPHONE HCL PF 1 MG/ML IJ SOLN
0.2500 mg | INTRAMUSCULAR | Status: DC | PRN
  Administered 2011-09-12: 0.5 mg via INTRAVENOUS

## 2011-09-12 MED ORDER — SODIUM CHLORIDE 0.9 % IR SOLN
Status: DC | PRN
  Administered 2011-09-12: 1000 mL

## 2011-09-12 MED ORDER — SODIUM CHLORIDE 0.9 % IV SOLN
INTRAVENOUS | Status: DC
  Administered 2011-09-12: 13:00:00 via INTRAVENOUS

## 2011-09-12 SURGICAL SUPPLY — 49 items
ADAPTER CATH SAFE LCK II CO PK (MISCELLANEOUS) ×1 IMPLANT
ADAPTER SAFE LOCK II CO PACK (MISCELLANEOUS) ×1
ADH SKN CLS APL DERMABOND .7 (GAUZE/BANDAGES/DRESSINGS)
ADPR DLYS CATH INSRT WRNCH TY (MISCELLANEOUS) ×1
BLADE SURG ROTATE 9660 (MISCELLANEOUS) ×1 IMPLANT
CANISTER SUCTION 2500CC (MISCELLANEOUS) IMPLANT
CATH MONCRIEF POPOVICH (CATHETERS) ×1 IMPLANT
CHLORAPREP W/TINT 26ML (MISCELLANEOUS) ×2 IMPLANT
CLOTH BEACON ORANGE TIMEOUT ST (SAFETY) ×2 IMPLANT
COIL SWAN NECK LT (MISCELLANEOUS) IMPLANT
COIL SWAN NECK RT (MISCELLANEOUS) ×2 IMPLANT
COVER SURGICAL LIGHT HANDLE (MISCELLANEOUS) ×2 IMPLANT
DECANTER SPIKE VIAL GLASS SM (MISCELLANEOUS) ×1 IMPLANT
DERMABOND ADVANCED (GAUZE/BANDAGES/DRESSINGS)
DERMABOND ADVANCED .7 DNX12 (GAUZE/BANDAGES/DRESSINGS) IMPLANT
DISSECTOR BLUNT TIP ENDO 5MM (MISCELLANEOUS) IMPLANT
ELECT REM PT RETURN 9FT ADLT (ELECTROSURGICAL) ×2
ELECTRODE REM PT RTRN 9FT ADLT (ELECTROSURGICAL) ×1 IMPLANT
GLOVE BIO SURGEON STRL SZ7.5 (GLOVE) ×1 IMPLANT
GLOVE BIOGEL PI IND STRL 7.5 (GLOVE) IMPLANT
GLOVE BIOGEL PI IND STRL 8 (GLOVE) ×1 IMPLANT
GLOVE BIOGEL PI INDICATOR 7.5 (GLOVE) ×1
GLOVE BIOGEL PI INDICATOR 8 (GLOVE) ×1
GLOVE ECLIPSE 6.5 STRL STRAW (GLOVE) ×1 IMPLANT
GLOVE SS BIOGEL STRL SZ 7.5 (GLOVE) ×1 IMPLANT
GLOVE SUPERSENSE BIOGEL SZ 7.5 (GLOVE) ×1
GLOVE SURG SS PI 7.0 STRL IVOR (GLOVE) ×2 IMPLANT
GOWN STRL NON-REIN LRG LVL3 (GOWN DISPOSABLE) ×4 IMPLANT
GOWN STRL REIN XL XLG (GOWN DISPOSABLE) ×2 IMPLANT
KIT BASIN OR (CUSTOM PROCEDURE TRAY) ×2 IMPLANT
KIT ROOM TURNOVER OR (KITS) ×2 IMPLANT
NS IRRIG 1000ML POUR BTL (IV SOLUTION) ×2 IMPLANT
PAD ARMBOARD 7.5X6 YLW CONV (MISCELLANEOUS) ×4 IMPLANT
SCALPEL HARMONIC ACE (MISCELLANEOUS) ×1 IMPLANT
SCISSORS LAP 5X35 DISP (ENDOMECHANICALS) ×1 IMPLANT
SET EXTENSION TUBING 8  CATH (SET/KITS/TRAYS/PACK) ×2 IMPLANT
SET IRRIG TUBING LAPAROSCOPIC (IRRIGATION / IRRIGATOR) IMPLANT
SLEEVE ENDOPATH XCEL 5M (ENDOMECHANICALS) ×1 IMPLANT
SPONGE GAUZE 4X4 12PLY (GAUZE/BANDAGES/DRESSINGS) ×1 IMPLANT
SUT ETHILON 4 0 PS 2 18 (SUTURE) ×1 IMPLANT
SUT ETHILON 5 0 PS 2 18 (SUTURE) IMPLANT
SUT MNCRL AB 4-0 PS2 18 (SUTURE) IMPLANT
TOWEL OR 17X24 6PK STRL BLUE (TOWEL DISPOSABLE) ×2 IMPLANT
TOWEL OR 17X26 10 PK STRL BLUE (TOWEL DISPOSABLE) ×2 IMPLANT
TRAY LAPAROSCOPIC (CUSTOM PROCEDURE TRAY) ×2 IMPLANT
TROCAR FALLER TUNNELING (TROCAR) ×3 IMPLANT
TROCAR XCEL 12X100 BLDLESS (ENDOMECHANICALS) ×2 IMPLANT
TROCAR XCEL NON-BLD 5MMX100MML (ENDOMECHANICALS) ×2 IMPLANT
WATER STERILE IRR 1000ML POUR (IV SOLUTION) IMPLANT

## 2011-09-12 NOTE — Progress Notes (Signed)
Patient was oriented to call bell system. Call bell placed within reach. Educated patient on falls risk and bed alarm being applied for 24 hours. Patient and his daughter verbalized understanding.

## 2011-09-12 NOTE — Anesthesia Postprocedure Evaluation (Signed)
  Anesthesia Post-op Note  Patient: Sean [name]  Procedure(s) Performed: Procedure(s) (LRB): LAPAROSCOPIC INSERTION CONTINUOUS AMBULATORY PERITONEAL DIALYSIS  (CAPD) CATHETER (N/A)  Patient Location: PACU  Anesthesia Type: General  Level of Consciousness: awake  Airway and Oxygen Therapy: Patient Spontanous Breathing  Post-op Pain: mild  Post-op Assessment: Post-op Vital signs reviewed  Post-op Vital Signs: stable  Complications: No apparent anesthesia complications

## 2011-09-12 NOTE — Op Note (Signed)
Preoperative Diagnosis: END STAGE RENAL FAILURE   Postoprative Diagnosis: END STAGE RENAL FAILURE   Procedure: Procedure(s): LAPAROSCOPIC INSERTION CONTINUOUS AMBULATORY PERITONEAL DIALYSIS  (CAPD) CATHETER   Surgeon: Glenna Fellows T   Anesthesia:  General endotracheal   Indications:   Patient is a 76 year old male with end-stage renal disease who desires peritoneal dialysis. He has been through preoperative counseling and training and we've discussed the procedure of peritoneal dialysis catheter placement laparoscopically including risks of anesthetic complications, bleeding, infection, catheter malfunction or occlusion in the long-term risks of peritonitis and catheter failure. He understands and desires to proceed.  Procedure Detail:  Patient is brought to the operating room, placed in the supine position on the operating table and general endotracheal anesthesia induced. He received preoperative IV antibiotics. The abdomen was widely sterilely prepped and draped. Patient timeout was performed correct patient identified and procedure verified. Local anesthesia was used to infiltrate the trocar sites. Access was obtained with a 5 mm Optiview trocar in the left upper quadrant without difficulty and pneumoperitoneum established. There was no evidence of trocar injury. There were extensive omental adhesions to the midline from a previous colectomy. I placed a second 5 mm trocar in the left lateral abdomen and using the harmonic scalpel and sharp dissection the omental adhesions were extensively taken down from the midline. I did encounter a small periumbilical incisional hernia that was not palpable on his preop exam. There was a single loop of small bowel adherent to the right anterior abdominal wall that was taken down without difficulty with sharp dissection. A site for the catheter insertion was chosen in the right periumbilical area and with local anesthetic injected initially a 12 mm  trocar was placed. A right-sided [place] peritoneal dialysis catheter was chosen and introduced through the trocar and oriented and then the trocar was gradually withdrawn holding the Silastic ball just deep to the peritoneum with the Teflon cuff in the rectus sheath. A site for the exit site was chosen and using the tunneling device the catheter was tunneled subcutaneously and brought out through an inferior exit site angled toward the patient's feet. The Teflon external cuff was buried just about a centimeter beneath the skin. The abdomen was inspected for hemostasis and there was no evidence of bleeding or injury. Trochars were removed and all CO2 evacuated. Skin incisions were closed with 4-0 nylon. Sponge needle and suite counts were correct.    Estimated Blood Loss:  Minimal         Drains: None  Blood Given: none          Specimens: None        Complications:  * No complications entered in OR log *         Disposition: PACU - hemodynamically stable.         Condition: stable  Mariella Saa MD, FACS  09/12/2011, 3:44 PM

## 2011-09-12 NOTE — Anesthesia Preprocedure Evaluation (Signed)
Anesthesia Evaluation  Patient identified by MRN, date of birth, ID band Patient awake    Reviewed: Allergy & Precautions, H&P , NPO status , Patient's Chart, lab work & pertinent test results  History of Anesthesia Complications Negative for: history of anesthetic complications  Airway Mallampati: I TM Distance: >3 FB     Dental  (+) Edentulous Upper and Edentulous Lower   Pulmonary  breath sounds clear to auscultation  Pulmonary exam normal       Cardiovascular hypertension, Pt. on medications + Past MI Rhythm:Regular Rate:Normal     Neuro/Psych  Headaches, Anxiety CVA, No Residual Symptoms    GI/Hepatic Neg liver ROS, GERD-  Controlled,  Endo/Other  Diabetes mellitus-, Well Controlled, Type 2  Renal/GU Renal disease     Musculoskeletal   Abdominal   Peds  Hematology   Anesthesia Other Findings   Reproductive/Obstetrics                           Anesthesia Physical Anesthesia Plan  ASA: III  Anesthesia Plan: General   Post-op Pain Management:    Induction: Intravenous  Airway Management Planned: Oral ETT  Additional Equipment:   Intra-op Plan:   Post-operative Plan: Extubation in OR  Informed Consent: I have reviewed the patients History and Physical, chart, labs and discussed the procedure including the risks, benefits and alternatives for the proposed anesthesia with the patient or authorized representative who has indicated his/her understanding and acceptance.   Dental advisory given  Plan Discussed with: CRNA, Anesthesiologist and Surgeon  Anesthesia Plan Comments:         Anesthesia Quick Evaluation

## 2011-09-12 NOTE — Transfer of Care (Signed)
Immediate Anesthesia Transfer of Care Note  Patient: Sean [name]  Procedure(s) Performed: Procedure(s) (LRB): LAPAROSCOPIC INSERTION CONTINUOUS AMBULATORY PERITONEAL DIALYSIS  (CAPD) CATHETER (N/A)  Patient Location: PACU  Anesthesia Type: General  Level of Consciousness: awake, alert  and oriented  Airway & Oxygen Therapy: Patient Spontanous Breathing and Patient connected to nasal cannula oxygen  Post-op Assessment: Report given to PACU RN, Post -op Vital signs reviewed and stable and Patient moving all extremities  Post vital signs: Reviewed and stable  Complications: No apparent anesthesia complications

## 2011-09-12 NOTE — Anesthesia Procedure Notes (Signed)
Procedure Name: Intubation Date/Time: 09/12/2011 2:26 PM Performed by: Elizbeth Squires R Pre-anesthesia Checklist: Patient identified, Suction available, Patient being monitored and Emergency Drugs available Patient Re-evaluated:Patient Re-evaluated prior to inductionOxygen Delivery Method: Circle system utilized Preoxygenation: Pre-oxygenation with 100% oxygen Ventilation: Oral airway inserted - appropriate to patient size and Mask ventilation without difficulty Laryngoscope Size: Mac and 3 Grade View: Grade II Tube type: Oral Tube size: 7.5 mm Number of attempts: 1 Airway Equipment and Method: Stylet Placement Confirmation: ETT inserted through vocal cords under direct vision Secured at: 23 cm Tube secured with: Tape Dental Injury: Teeth and Oropharynx as per pre-operative assessment

## 2011-09-12 NOTE — H&P (Signed)
  Subjective:    Sean [name] is a 76 y.o. male who presents for placement of PD catheter.    Objective:   BP 129/78  Pulse 87  Temp(Src) 98 F (36.7 C) (Oral)  Resp 18  SpO2 99%  General:  alert and cooperative Skin:  normal and no rash or abnormalities Lymph Nodes:  Cervical, supraclavicular, and axillary nodes normal. Lungs:  clear to auscultation bilaterally Heart:  regular rate and rhythm Abdomen: Healed low midline incision no hernias Extremities:  Healed wounds LEs Neurologic:  negative Psychiatric:  normal mood, behavior, speech, dress, and thought processes    Assessment: ESRD, desires PD    Plan: Placement of PD catheter   1. Discussed the risk of surgery,  and the risks of general anesthetic including MI, CVA, sudden death or even reaction to anesthetic medications. The patient understands the risks, any and all questions were answered to the patient's satisfaction.   Mariella Saa MD, FACS  09/12/2011, 2:03 PM

## 2011-09-12 NOTE — Progress Notes (Signed)
Attempted to call Dr. Krista Blue x 2 to notify of cbg 74. No answer. Pt states no symptoms. Does not take oral meds or insulin. Will recheck CBG in 1 hour unless pt develops symptoms before then.

## 2011-09-13 ENCOUNTER — Ambulatory Visit (HOSPITAL_COMMUNITY): Payer: Medicare Other

## 2011-09-13 LAB — GLUCOSE, CAPILLARY: Glucose-Capillary: 122 mg/dL — ABNORMAL HIGH (ref 70–99)

## 2011-09-13 LAB — CBC
HCT: 33.7 % — ABNORMAL LOW (ref 39.0–52.0)
Hemoglobin: 11.1 g/dL — ABNORMAL LOW (ref 13.0–17.0)
MCH: 28.1 pg (ref 26.0–34.0)
MCHC: 32.9 g/dL (ref 30.0–36.0)
MCV: 85.3 fL (ref 78.0–100.0)
Platelets: 156 10*3/uL (ref 150–400)
RBC: 3.95 MIL/uL — ABNORMAL LOW (ref 4.22–5.81)
RDW: 14.3 % (ref 11.5–15.5)
WBC: 4.4 10*3/uL (ref 4.0–10.5)

## 2011-09-13 LAB — BASIC METABOLIC PANEL
BUN: 61 mg/dL — ABNORMAL HIGH (ref 6–23)
CO2: 24 mEq/L (ref 19–32)
Calcium: 9.4 mg/dL (ref 8.4–10.5)
Chloride: 96 mEq/L (ref 96–112)
Creatinine, Ser: 9.96 mg/dL — ABNORMAL HIGH (ref 0.50–1.35)
GFR calc Af Amer: 5 mL/min — ABNORMAL LOW (ref >90)
GFR calc non Af Amer: 4 mL/min — ABNORMAL LOW (ref >90)
Glucose, Bld: 82 mg/dL (ref 70–99)
Potassium: 5.2 mEq/L — ABNORMAL HIGH (ref 3.5–5.1)
Sodium: 134 mEq/L — ABNORMAL LOW (ref 135–145)

## 2011-09-13 MED ORDER — HYDROCODONE-ACETAMINOPHEN 5-325 MG PO TABS: 1.0000 | ORAL_TABLET | ORAL | Status: DC | PRN

## 2011-09-13 MED ORDER — PARICALCITOL 5 MCG/ML IV SOLN
INTRAVENOUS | Status: AC
  Administered 2011-09-13: 7 ug via INTRAVENOUS
  Filled 2011-09-13: qty 2

## 2011-09-13 NOTE — Discharge Summary (Signed)
   Patient ID: Sean [name] [id number] 76 y.o. [birth year]  09/12/2011  Discharge date and time: 09/13/2011   Admitting Physician: Glenna Fellows T  Discharge Physician: Glenna Fellows T  Admission Diagnoses: END STAGE RENAL FAILURE   Discharge Diagnoses: Same  Operations: Procedure(s): LAPAROSCOPIC INSERTION CONTINUOUS AMBULATORY PERITONEAL DIALYSIS  (CAPD) CATHETER  Admission Condition: good  Discharged Condition: good  Indication for Admission: ESRD, for PD cateter  Hospital Course: Uneventful procedure and post op course without complication  Consults: nephrology   Disposition: Home  Patient Instructions:   Walton, Digilio  Home Medication Instructions [id number]   Printed on:09/13/11 [phone]  Medication Information                    amLODipine (NORVASC) 10 MG tablet Take 10 mg by mouth daily.           loratadine (CLARITIN) 10 MG tablet Take 10 mg by mouth daily.           hydrOXYzine (ATARAX/VISTARIL) 10 MG tablet Take 10 mg by mouth at bedtime as needed. For sleep           HYDROcodone-acetaminophen (NORCO) 5-325 MG per tablet Take 1-2 tablets by mouth every 4 (four) hours as needed.             Activity: activity as tolerated Diet: renal diet Wound Care: keep wound clean and dry  Follow-up:  With home training center in 1 week.  Signed: Mariella Saa MD, FACS  09/13/2011, 8:11 AM

## 2011-09-13 NOTE — Progress Notes (Signed)
Flushed PD catheter with 500 ml 1.5 % low cal/low mag PD fluid per MD order. Patient had 500 ml clear/red fluid return. Patient tolerated well. Steele Berg RN

## 2011-09-13 NOTE — Procedures (Signed)
Pt seen on HD  Ap 110  Vp 260.  Pt to be DC post HD.  Spoke with home training, [place], who will call him and set up flushes.

## 2011-09-13 NOTE — Progress Notes (Signed)
Patient ID: JEREME LOREN, male   DOB: 1935-06-16, 76 y.o.   MRN: [id number] 1 Day Post-Op  Subjective: Minimal discomfort, no problems with flush.  On dialysis.  Objective: Vital signs in last 24 hours: Temp:  [96.7 F (35.9 C)-98.4 F (36.9 C)] 98.2 F (36.8 C) (04/03 4098) Pulse Rate:  [68-87] 69  (04/03 0701) Resp:  [18-21] 18  (04/03 0701) BP: (127-156)/(45-78) 143/66 mmHg (04/03 0638) SpO2:  [94 %-100 %] 94 % (04/03 1191) Weight:  [224 lb 13.9 oz (102 kg)-230 lb 2.6 oz (104.4 kg)] 224 lb 13.9 oz (102 kg) (04/03 4782) Last BM Date: 09/11/11  Intake/Output from previous day: 04/02 0701 - 04/03 0700 In: 880 [P.O.:240; I.V.:640] Out: 50 [Blood:50] Intake/Output this shift:    General appearance: alert and no distress GI: normal findings: soft, non-tender, dressings dry  Lab Results:   Basename 09/12/11 1030  WBC 4.1  HGB 12.6*  HCT 37.9*  PLT 157   BMET  Basename 09/12/11 2127 09/12/11 1030  NA 135 137  K 5.1 4.7  CL 95* 94*  CO2 23 28  GLUCOSE 106* 86  BUN 54* 48*  CREATININE 8.81* 8.07*  CALCIUM 9.8 10.8*     Studies/Results: No results found.  Anti-infectives: Anti-infectives     Start     Dose/Rate Route Frequency Ordered Stop   09/12/11 0000   ceFAZolin (ANCEF) IVPB 2 g/50 mL premix        2 g 100 mL/hr over 30 Minutes Intravenous 60 min pre-op 09/11/11 1417 09/12/11 1430          Assessment/Plan: s/p Procedure(s): LAPAROSCOPIC INSERTION CONTINUOUS AMBULATORY PERITONEAL DIALYSIS  (CAPD) CATHETER No apparent complications, home today   LOS: 1 day    Rhonin Trott T 09/13/2011

## 2011-09-13 NOTE — Discharge Instructions (Signed)
Call [facility] [phone] for problems or questions about your surgery Peritoneal Dialysis Dialysis can be done using a machine outside of the body (hemodialysis). Or, it can be done inside the body (peritoneal dialysis). The word "peritoneal" refers to the lining or membrane of the belly (abdominal cavity). The peritoneal membrane is a thin, plastic-like lining inside the belly that covers the organs and fits in the abdominal or peritoneal cavity, such as the stomach, liver and the kidneys. This lining works like a filter. It will allow certain things to pass from your blood through the lining and into a special solution that has been placed into your belly. In this type of dialysis, the peritoneum is used to help clean the blood.  If you need dialysis, your kidneys are not working right. Healthy kidneys take out extra water and waste products, which becomes urine. When the kidneys do not do this, serious problems can develop. The waste and water build up in the blood. Your hands and feet might swell. You may feel tired, weak or sick to your stomach. Also, your blood pressure may rise. If not treated, you could die. Dialysis is a treatment that does the work that your kidneys would do if they were healthy.  It cleans your blood.   It will make sure your body has the right amount of certain chemicals that it needs. They include potassium, sodium and bicarbonate.   It will help control your blood pressure.  UNDERSTANDING PERITONEAL DIALYSIS  Here is how peritoneal dialysis works:   First, you will have surgery to put a soft plastic tube (catheter) into your belly (abdomen). This will allow you to easily connect yourself to special tubing, which will then let a special dialysis solution to be placed into your abdomen.   For each treatment, you will need at least one bag of dialysis solution (a liquid called dialysate). It is a mix of water that is pure and free of germs (sterile),  sugar (dextrose) and the nutrients and minerals found in your blood. Sometimes, more than one bag is needed to get the right amount of fluid for your abdomen. Your caregiver will explain what size and how many bags you will need.   The dialysate is slowly put through the catheter to fill the abdomen (called the peritoneal cavity). This dialysate will need to stay in your body for 3-4 hours. This is known as the dwell time.   The solution is working to clean the blood and remove wastes from your body. At the end of this time, the solution is drained from your body through tubing into an empty bag. It is then replaced with a fresh dialysate.   The draining and replacing of the dialysate is called an exchange or cycle. The catheter is capped after each exchange. Once the solution is in your body, you are then free to do whatever you would like until the next exchange. Most people will need to do 4-5 exchanges each day.   There are two different methods that can be used.   Continuous ambulatory peritoneal dialysis (CAPD): You put the solution into your abdomen, cap your catheter and then go about your day. Several hours later, you reconnect to a tubing set up, drain out the solution and then put more solution in. This is done several times a day. No machine is needed.   Continuous cycler-assisted peritoneal dialysis (CCPD): A machine is used, which fills the abdomen with dialysate and then drains  it. This happens several times. It usually is done at night while you are sleeping. When you wake up, you can disconnect from the machine and are free to go to go about your day.  PREPARING FOR EXCHANGES  Discuss the details of the procedure with your caregivers. You will be working with a nurse who is specially trained in doing dialysis. Make sure you understand:   How to do an exchange.   How much solution you need.   What type of solution you will need.   How often you should do an exchange. Ask:    How many times each day?   When? At meals? At bedtime?   Always keep the dialysate bags and other supplies in a cool, clean and dry place.   Keeping everything clean is very important.   The catheter and its cap must be free from germs (sterile)   The adapter also must be sterile. It attaches the dialysis bag and tubing to the catheter.   Clean the area of your body around the catheter every day. Use a chemical that fights infection (antiseptic).   Wash your hands thoroughly before starting an exchange.   You may be taught to wear a mask to cover your nose and mouth. This makes infection less likely to happen.   You may be taught to close doors, windows and turn off any fans before doing an exchange.   Check the dialysate bag very carefully.   Make sure it is the right size bag for you. This information is on the label.   Also, make sure it is the right mixture. For some people, the dialysate contents vary. For instance, the mixture might be a stronger solution for overnight.   Check the expiration date (the last date you can use the bag). It also is on the label. If the date has gone by, throw away the bag.   The solution should be clear. You should be able to see any writing on the side of the bag clearly through the solution. Do not use a cloudy solution.   Gently squeeze the bag to make sure there are no leaks.   Use a dry heating pad to warm the dialysate in the bag. Leave the cover on the bag while you do this.   This is for comfort. You can skip this step if you want.   Never place the bag of solution under warm or hot water. Water from a faucet is not sterile and could cause germs to get into the bag. Infection could then result.  PERFORMING AN EXCHANGE  For continuous ambulatory dialysis:   Attach the dialysis bag and tubing to your catheter. Hang the bag so that gravity (the natural downward pull) draws the solution down and into your abdomen once the clamps are  opened. This should take about 10 minutes.   Remove the bag and tubing from the catheter. Cap the catheter.   The solution stays in the abdomen for 3-4 hours (dwell time). The solution is working to clean the blood and remove wastes from your body.   When you are ready to drain the solution for another exchange, take the cap off the catheter. Then, attach the catheter to tubing, which is attached to an empty bag. Place this empty bag below the abdomen or on the floor or stool and undo the clamps.   Gravity helps pull the fluid out of the abdomen and into the bag. The fluid in the  bag may look yellow and clear, like urine. It usually takes about 20 minutes to drain the fluid out of the abdomen.   When the solution has drained, start the process again by infusing a new bag of dialysate and then capping the catheter.   This should continue until you have used all of the solution that you are to use each day.   Sometimes, a small machine is used overnight. It is called a mini-cycler. This is done if the body cannot go all night without an exchange. The machine lets you sleep without having to get up and do an exchange.   For continuous cycler-assisted dialysis:   You will be taught how to set up or program your machine.   When you are ready for bed, put the dialysate bags onto the cycler machine. Put on exactly the number of bags that your caregiver said to use.   Connect your catheter to the machine and turn the cycler machine on.   Overnight, the cycler will do several exchanges. It often does three to five, sometimes more.   Solution that is in your abdomen in the morning will stay during the day. The machine is set to make the daytime solution stronger, if that is needed.   In the morning, you will disconnect from the machine and cap your catheter and go about your day.   Sometimes, an extra exchange is done during the day. This may be needed to remove excess waste or fluid.  IMPORTANT  REMINDERS  You will need to follow a very strict schedule. Every step of the dialysis procedure must be done every day. Sometimes, several times a day. Altogether, this might take an extra 2 hours or more. However, you must stick to the routine. Do not skip a day. Do not skip a procedure.   Some people find it helpful to work with a Veterinary surgeon or Child psychotherapist in addition to the renal (kidney) nurse. They can help you figure out how to change your daily routine to fit in the dialysis sessions.   You may need to change your diet. Ask your caregiver for advice, or talk with a nutritionist about what you should and should not eat.   You will need to weigh yourself every day and keep track of what your weight is.   You may be taught how to check your blood pressure before every exchange. Your blood pressure reading will help determine what type of solution to use. If your blood pressure is too high, you may need a stronger solution.  RISKS AND COMPLICATIONS  Possible problems vary, depending on the method you use. Your overall health also can have an effect. Problems that could develop because of dialysis include:  Infection. This is the most common problem. It could occur:   In the peritoneum. This is called peritonitis.   Around the catheter.   Weight gain. The dialysate contains a type of sugar known as dextrose. Dextrose has a lot of calories. The body takes in several hundred calories from this sugar each day.   Weakened muscles in the abdomen. This can result from all of the fluid that your body has to hold in the abdomen.   Catheter replacement. Sometimes, a new one has to be put in.   Change in dialysis method. Due to some complications, you may need to change to hemodialysis for a short time and have your dialysis done at a center.   Trouble adjusting to your new  lifestyle. In some people, this leads to depression.   Sleep problems.   Dialysis-related amyloidosis. This sometimes  occurs after 5 years of dialysis. Protein builds up in the blood. This can cause painful deposits on bones, joints and tendons (which connect muscle to bone). Or, it can cause hollow spots in bones that make them more likely to break.   Excess fluid. Your body may absorb too much of the fluid that is held in the abdomen. This can lead to heart or lung problems.  SEEK MEDICAL CARE IF:   You have any problems with an exchange.   The area around the catheter becomes red or painful.   The catheter seems loose, or it feels like it is coming out.   A bag of dialysate looks cloudy. Or, the liquid is an unusual color.   Abdominal pain or discomfort.   You feel sick to your stomach (nauseous) or throw up (vomit).   You develop a fever of more than 102 F (38.9 C).  SEEK IMMEDIATE MEDICAL CARE IF:  You develop a fever of more than 102 F (38.9 C). Document Released: 03/26/2009 Document Revised: 05/18/2011 Document Reviewed: 03/26/2009 [facility] Patient Information 2012 [place], [place].

## 2011-09-14 ENCOUNTER — Encounter (HOSPITAL_COMMUNITY): Payer: Self-pay | Admitting: General Surgery

## 2011-09-19 ENCOUNTER — Inpatient Hospital Stay (HOSPITAL_COMMUNITY): Payer: Medicare Other

## 2011-09-19 ENCOUNTER — Emergency Department (HOSPITAL_COMMUNITY): Payer: Medicare Other

## 2011-09-19 ENCOUNTER — Inpatient Hospital Stay (HOSPITAL_COMMUNITY)
Admission: EM | Admit: 2011-09-19 | Discharge: 2011-09-22 | DRG: 064 | Disposition: A | Discharge status: Skilled Nursing Facility | Payer: Medicare Other | Attending: Internal Medicine | Admitting: Internal Medicine

## 2011-09-19 ENCOUNTER — Encounter (HOSPITAL_COMMUNITY): Payer: Self-pay | Admitting: *Deleted

## 2011-09-19 DIAGNOSIS — Z8673 Personal history of transient ischemic attack (TIA), and cerebral infarction without residual deficits: Secondary | ICD-10-CM

## 2011-09-19 DIAGNOSIS — E785 Hyperlipidemia, unspecified: Secondary | ICD-10-CM | POA: Diagnosis present

## 2011-09-19 DIAGNOSIS — I6350 Cerebral infarction due to unspecified occlusion or stenosis of unspecified cerebral artery: Principal | ICD-10-CM | POA: Diagnosis present

## 2011-09-19 DIAGNOSIS — Z87891 Personal history of nicotine dependence: Secondary | ICD-10-CM

## 2011-09-19 DIAGNOSIS — G609 Hereditary and idiopathic neuropathy, unspecified: Secondary | ICD-10-CM | POA: Diagnosis present

## 2011-09-19 DIAGNOSIS — I10 Essential (primary) hypertension: Secondary | ICD-10-CM | POA: Diagnosis present

## 2011-09-19 DIAGNOSIS — R05 Cough: Secondary | ICD-10-CM

## 2011-09-19 DIAGNOSIS — I219 Acute myocardial infarction, unspecified: Secondary | ICD-10-CM | POA: Diagnosis present

## 2011-09-19 DIAGNOSIS — E119 Type 2 diabetes mellitus without complications: Secondary | ICD-10-CM | POA: Diagnosis present

## 2011-09-19 DIAGNOSIS — H5340 Unspecified visual field defects: Secondary | ICD-10-CM | POA: Diagnosis present

## 2011-09-19 DIAGNOSIS — I739 Peripheral vascular disease, unspecified: Secondary | ICD-10-CM | POA: Diagnosis present

## 2011-09-19 DIAGNOSIS — D638 Anemia in other chronic diseases classified elsewhere: Secondary | ICD-10-CM | POA: Diagnosis present

## 2011-09-19 DIAGNOSIS — R4701 Aphasia: Secondary | ICD-10-CM | POA: Diagnosis present

## 2011-09-19 DIAGNOSIS — F341 Dysthymic disorder: Secondary | ICD-10-CM | POA: Diagnosis present

## 2011-09-19 DIAGNOSIS — L0390 Cellulitis, unspecified: Secondary | ICD-10-CM

## 2011-09-19 DIAGNOSIS — N2581 Secondary hyperparathyroidism of renal origin: Secondary | ICD-10-CM | POA: Diagnosis present

## 2011-09-19 DIAGNOSIS — G8190 Hemiplegia, unspecified affecting unspecified side: Secondary | ICD-10-CM | POA: Diagnosis present

## 2011-09-19 DIAGNOSIS — Z8679 Personal history of other diseases of the circulatory system: Secondary | ICD-10-CM

## 2011-09-19 DIAGNOSIS — Z7982 Long term (current) use of aspirin: Secondary | ICD-10-CM

## 2011-09-19 DIAGNOSIS — I639 Cerebral infarction, unspecified: Secondary | ICD-10-CM | POA: Diagnosis present

## 2011-09-19 DIAGNOSIS — N186 End stage renal disease: Secondary | ICD-10-CM | POA: Diagnosis present

## 2011-09-19 DIAGNOSIS — M129 Arthropathy, unspecified: Secondary | ICD-10-CM | POA: Diagnosis present

## 2011-09-19 DIAGNOSIS — E089 Diabetes mellitus due to underlying condition without complications: Secondary | ICD-10-CM | POA: Diagnosis present

## 2011-09-19 DIAGNOSIS — I252 Old myocardial infarction: Secondary | ICD-10-CM

## 2011-09-19 DIAGNOSIS — I120 Hypertensive chronic kidney disease with stage 5 chronic kidney disease or end stage renal disease: Secondary | ICD-10-CM | POA: Diagnosis present

## 2011-09-19 DIAGNOSIS — R51 Headache: Secondary | ICD-10-CM

## 2011-09-19 DIAGNOSIS — K219 Gastro-esophageal reflux disease without esophagitis: Secondary | ICD-10-CM | POA: Diagnosis present

## 2011-09-19 DIAGNOSIS — Z992 Dependence on renal dialysis: Secondary | ICD-10-CM

## 2011-09-19 DIAGNOSIS — F411 Generalized anxiety disorder: Secondary | ICD-10-CM

## 2011-09-19 DIAGNOSIS — I672 Cerebral atherosclerosis: Secondary | ICD-10-CM | POA: Diagnosis present

## 2011-09-19 DIAGNOSIS — Z79899 Other long term (current) drug therapy: Secondary | ICD-10-CM

## 2011-09-19 DIAGNOSIS — I2510 Atherosclerotic heart disease of native coronary artery without angina pectoris: Secondary | ICD-10-CM | POA: Diagnosis present

## 2011-09-19 HISTORY — DX: Dependence on renal dialysis: Z99.2

## 2011-09-19 HISTORY — DX: Type 2 diabetes mellitus without complications: E11.9

## 2011-09-19 HISTORY — DX: Polyneuropathy, unspecified: G62.9

## 2011-09-19 LAB — LACTIC ACID, PLASMA: Lactic Acid, Venous: 0.7 mmol/L (ref 0.5–2.2)

## 2011-09-19 LAB — BASIC METABOLIC PANEL
BUN: 28 mg/dL — ABNORMAL HIGH (ref 6–23)
CO2: 28 mEq/L (ref 19–32)
CO2: 28 mEq/L (ref 19–32)
Calcium: 9.2 mg/dL (ref 8.4–10.5)
Chloride: 92 mEq/L — ABNORMAL LOW (ref 96–112)
Chloride: 94 mEq/L — ABNORMAL LOW (ref 96–112)
Creatinine, Ser: 7.33 mg/dL — ABNORMAL HIGH (ref 0.50–1.35)
GFR calc Af Amer: 7 mL/min — ABNORMAL LOW (ref >90)
Glucose, Bld: 82 mg/dL (ref 70–99)
Potassium: 3.8 mEq/L (ref 3.5–5.1)
Sodium: 133 mEq/L — ABNORMAL LOW (ref 135–145)

## 2011-09-19 LAB — BODY FLUID CELL COUNT WITH DIFFERENTIAL

## 2011-09-19 LAB — CBC
MCV: 85.1 fL (ref 78.0–100.0)
Platelets: 157 10*3/uL (ref 150–400)
RBC: 3.69 MIL/uL — ABNORMAL LOW (ref 4.22–5.81)
RDW: 13.8 % (ref 11.5–15.5)
WBC: 2.2 10*3/uL — ABNORMAL LOW (ref 4.0–10.5)

## 2011-09-19 LAB — DIFFERENTIAL
Basophils Absolute: 0 10*3/uL (ref 0.0–0.1)
Eosinophils Relative: 0 % (ref 0–5)
Lymphocytes Relative: 31 % (ref 12–46)
Lymphs Abs: 0.7 10*3/uL (ref 0.7–4.0)
Neutro Abs: 1.5 10*3/uL — ABNORMAL LOW (ref 1.7–7.7)

## 2011-09-19 LAB — RETICULOCYTES: RBC.: 3.67 MIL/uL — ABNORMAL LOW (ref 4.22–5.81)

## 2011-09-19 LAB — GLUCOSE, CAPILLARY: Glucose-Capillary: 110 mg/dL — ABNORMAL HIGH (ref 70–99)

## 2011-09-19 MED ORDER — RENA-VITE PO TABS
1.0000 | ORAL_TABLET | Freq: Every day | ORAL | Status: DC
  Administered 2011-09-19 – 2011-09-21 (×3): 1 via ORAL
  Filled 2011-09-19 (×5): qty 1

## 2011-09-19 MED ORDER — ACETAMINOPHEN 650 MG RE SUPP: 650.0000 mg | RECTAL | Status: DC | PRN

## 2011-09-19 MED ORDER — CALCIUM CARBONATE 1250 (500 CA) MG PO TABS
1.0000 | ORAL_TABLET | Freq: Three times a day (TID) | ORAL | Status: DC
  Administered 2011-09-19 – 2011-09-22 (×7): 500 mg via ORAL
  Filled 2011-09-19 (×12): qty 1

## 2011-09-19 MED ORDER — SENNOSIDES-DOCUSATE SODIUM 8.6-50 MG PO TABS
1.0000 | ORAL_TABLET | Freq: Every evening | ORAL | Status: DC | PRN
  Filled 2011-09-19: qty 1

## 2011-09-19 MED ORDER — ONDANSETRON HCL 4 MG/2ML IJ SOLN: 4.0000 mg | Freq: Four times a day (QID) | INTRAMUSCULAR | Status: DC | PRN

## 2011-09-19 MED ORDER — ASPIRIN 325 MG PO TABS
325.0000 mg | ORAL_TABLET | Freq: Every day | ORAL | Status: DC
  Administered 2011-09-19 – 2011-09-22 (×4): 325 mg via ORAL
  Filled 2011-09-19 (×4): qty 1

## 2011-09-19 MED ORDER — AMLODIPINE BESYLATE 5 MG PO TABS
5.0000 mg | ORAL_TABLET | Freq: Every day | ORAL | Status: DC
  Administered 2011-09-19 – 2011-09-22 (×3): 5 mg via ORAL
  Filled 2011-09-19 (×4): qty 1

## 2011-09-19 MED ORDER — IOHEXOL 300 MG/ML  SOLN
80.0000 mL | Freq: Once | INTRAMUSCULAR | Status: AC | PRN
  Administered 2011-09-19: 80 mL via INTRAVENOUS

## 2011-09-19 MED ORDER — SODIUM CHLORIDE 0.9 % IV SOLN
3.0000 g | INTRAVENOUS | Status: DC
  Administered 2011-09-19 – 2011-09-22 (×3): 3 g via INTRAVENOUS
  Filled 2011-09-19 (×4): qty 3

## 2011-09-19 MED ORDER — ACETAMINOPHEN 325 MG PO TABS: 650.0000 mg | ORAL_TABLET | ORAL | Status: DC | PRN

## 2011-09-19 MED ORDER — PAROXETINE HCL 10 MG PO TABS
10.0000 mg | ORAL_TABLET | Freq: Every day | ORAL | Status: DC
  Administered 2011-09-19 – 2011-09-22 (×4): 10 mg via ORAL
  Filled 2011-09-19 (×4): qty 1

## 2011-09-19 MED ORDER — INSULIN ASPART 100 UNIT/ML ~~LOC~~ SOLN: 0.0000 [IU] | Freq: Every day | SUBCUTANEOUS | Status: DC

## 2011-09-19 MED ORDER — METOPROLOL TARTRATE 25 MG PO TABS
25.0000 mg | ORAL_TABLET | Freq: Two times a day (BID) | ORAL | Status: DC
  Administered 2011-09-19 – 2011-09-22 (×5): 25 mg via ORAL
  Filled 2011-09-19 (×8): qty 1

## 2011-09-19 MED ORDER — HEPARIN SODIUM (PORCINE) 1000 UNIT/ML DIALYSIS
20.0000 [IU]/kg | INTRAMUSCULAR | Status: DC | PRN
  Filled 2011-09-19: qty 2

## 2011-09-19 MED ORDER — PARICALCITOL 5 MCG/ML IV SOLN
7.0000 ug | INTRAVENOUS | Status: DC
  Administered 2011-09-20 – 2011-09-22 (×2): 7 ug via INTRAVENOUS
  Filled 2011-09-19 (×2): qty 1.4

## 2011-09-19 MED ORDER — OXYCODONE HCL 5 MG PO TABS
5.0000 mg | ORAL_TABLET | ORAL | Status: DC | PRN
Start: 2011-09-19 — End: 2011-09-22

## 2011-09-19 MED ORDER — ASPIRIN 300 MG RE SUPP
300.0000 mg | Freq: Every day | RECTAL | Status: DC
  Filled 2011-09-19 (×4): qty 1

## 2011-09-19 MED ORDER — INSULIN ASPART 100 UNIT/ML ~~LOC~~ SOLN: 0.0000 [IU] | Freq: Three times a day (TID) | SUBCUTANEOUS | Status: DC

## 2011-09-19 MED ORDER — ENOXAPARIN SODIUM 40 MG/0.4ML ~~LOC~~ SOLN
40.0000 mg | SUBCUTANEOUS | Status: DC
  Administered 2011-09-19 – 2011-09-21 (×2): 40 mg via SUBCUTANEOUS
  Filled 2011-09-19 (×4): qty 0.4

## 2011-09-19 NOTE — ED Provider Notes (Signed)
History     CSN: [id number]  Arrival date & time 09/19/11  0945   First MD Initiated Contact with Patient 09/19/11 [phone]      Chief Complaint  Patient presents with  . Altered Mental Status    (Consider location/radiation/quality/duration/timing/severity/associated sxs/prior treatment) HPI CC confusion and word finding difficulty onset yesterday afternoon.  Sx's moderate, no aggravating or alleviating factors.  Pt has esrd, is currently on HD but had tenkhoff catheter placed 7 days ago.  Has warmth and erythema around the catheter site, denies abd pain and fever.  Past Medical History  Diagnosis Date  . Hyperparathyroidism   . Arthritis   . Hyperlipidemia   . Hypertension   . Chronic kidney disease   . Neuromuscular disorder   . Stroke   . GERD (gastroesophageal reflux disease)   . Headache     occasional  migraine   . Heart attack 2011; 1990's  . Type II diabetes mellitus   . Peripheral neuropathy   . Anxiety   . Depression   . Dialysis patient     M, W, F; [address] (09/19/11)    Past Surgical History  Procedure Date  . Colon surgery 2011    colon resection   . Parathyroidectomy 2011  . Capd insertion 09/12/2011    Procedure: LAPAROSCOPIC INSERTION CONTINUOUS AMBULATORY PERITONEAL DIALYSIS  (CAPD) CATHETER;  Surgeon: Mariella Saa, MD;  Location: [facility] OR;  Service: General;  Laterality: N/A;  . Sp av dialysis shunt access existing *l* 2003  . Dg av dialysis shunt intro ndl*r* or     "not working" (09/19/11)    Family History  Problem Relation Age of Onset  . Cancer Sister     breast  . Cancer Brother     lung  . Anesthesia problems Neg Hx   . Hypotension Neg Hx   . Malignant hyperthermia Neg Hx   . Pseudochol deficiency Neg Hx     History  Substance Use Topics  . Smoking status: Former Smoker -- 0.2 packs/day for 7 years    Types: Cigarettes    Quit date: 06/12/1976  . Smokeless tobacco: Never Used  . Alcohol Use: Yes     09/19/11  "occasionally; last alcohol 2011"      Review of Systems  Constitutional: Negative for fever.  Neurological: Positive for speech difficulty.  Psychiatric/Behavioral: Positive for confusion.  All other systems reviewed and are negative.    Allergies  Benazepril hcl  Home Medications   Current Outpatient Rx  Name Route Sig Dispense Refill  . AMLODIPINE BESYLATE 10 MG PO TABS Oral Take 10 mg by mouth daily.    Marland Kitchen HYDROCODONE-ACETAMINOPHEN 5-325 MG PO TABS Oral Take 1-2 tablets by mouth every 4 (four) hours as needed. 30 tablet 1  . HYDROXYZINE HCL 10 MG PO TABS Oral Take 10 mg by mouth at bedtime as needed. For sleep    . LORATADINE 10 MG PO TABS Oral Take 10 mg by mouth daily.      BP 164/72  Pulse 84  Temp(Src) 100.2 F (37.9 C) (Oral)  Resp 18  Ht 5' (1.524 m)  SpO2 96%  Physical Exam  Nursing note and vitals reviewed. Constitutional: He appears well-developed and well-nourished.  HENT:  Head: Normocephalic and atraumatic.  Eyes: Right eye exhibits no discharge. Left eye exhibits no discharge.  Neck: Normal range of motion. Neck supple.  Cardiovascular: Normal rate, regular rhythm and normal heart sounds.   Pulmonary/Chest: Effort normal and breath  sounds normal.  Abdominal: Soft. There is no tenderness.  Musculoskeletal: Normal range of motion. He exhibits no tenderness.  Neurological: He is alert. He has normal strength. No sensory deficit.       Oriented to person and place but when asked what year it is he answers "spring"  Skin: Skin is warm and dry. There is erythema (and warmth surrounding tenkhoff insertions site).    ED Course  Procedures (including critical care time)  Labs Reviewed  BASIC METABOLIC PANEL - Abnormal; Notable for the following:    Sodium 133 (*)    Chloride 92 (*)    BUN 28 (*)    Creatinine, Ser 7.42 (*)    GFR calc non Af Amer 6 (*)    GFR calc Af Amer 7 (*)    All other components within normal limits  CBC - Abnormal; Notable  for the following:    WBC 2.2 (*)    RBC 3.69 (*)    Hemoglobin 10.5 (*)    HCT 31.4 (*)    All other components within normal limits  DIFFERENTIAL - Abnormal; Notable for the following:    Neutro Abs 1.5 (*)    All other components within normal limits  BODY FLUID CELL COUNT WITH DIFFERENTIAL - Abnormal; Notable for the following:    Color, Fluid BROWN (*)    Appearance, Fluid TURBID (*)    WBC, Fluid 6040 (*)    All other components within normal limits  LACTIC ACID, PLASMA  GRAM STAIN  CULTURE, BLOOD (ROUTINE X 2)  CULTURE, BLOOD (ROUTINE X 2)  URINALYSIS, ROUTINE W REFLEX MICROSCOPIC  VITAMIN B12  FOLATE  IRON AND TIBC  FERRITIN  RETICULOCYTES  BASIC METABOLIC PANEL   Dg Chest 2 View  09/19/2011  *RADIOLOGY REPORT*  Clinical Data: Altered mental status  CHEST - 2 VIEW  Comparison: 09/05/2011  Findings: Cardiomediastinal silhouette is stable.  Elevation of the left hemidiaphragm again noted.  Bilateral basilar atelectasis. Stable degenerative changes thoracic spine.  No pulmonary edema. Stable atelectasis or scarring right midlung laterally.  IMPRESSION: Elevation of the left hemidiaphragm.  Bilateral basilar atelectasis.  No focal infiltrate or pulmonary edema.  Original Report Authenticated By: Natasha Mead, M.D.   Ct Head Wo Contrast  09/19/2011  *RADIOLOGY REPORT*  Clinical Data: Confusion, mental status change  CT HEAD WITHOUT CONTRAST  Technique:  Contiguous axial images were obtained from the base of the skull through the vertex without contrast.  Comparison: CT sinus 01/18/2009 which includes a significant amount of information of the brain.  Findings: Hypodensity left parietal cortex compatible with acute infarct.  This was not present on the prior CT.  No associated hemorrhage.  Moderate to advanced atrophy.  Chronic microvascular ischemic changes throughout the white matter.  Negative for hemorrhage.  Heavy calcification of the carotid vertebral arteries.  No acute abnormality  of the skull  IMPRESSION: Left parietal hypodensity, compatible with acute infarct.  Extensive atrophy and chronic microvascular ischemic change.  No acute hemorrhage.  Original Report Authenticated By: Camelia Phenes, M.D.     1. CVA (cerebral infarction)   2. Cellulitis       MDM  Pt is in nad, afvss, nontoxic appearing, exam and hx as above.  Has mild cellulitis surrounding catheter site.  CT head shows evidence of acute cva, pt far outside of tpa window.  Labs o/w unremarkable.  Internal medicine consulted and will admit.   They will choose abx for cellulitis.  Elijio Miles, MD 09/20/11 [phone]

## 2011-09-19 NOTE — ED Notes (Signed)
[id number] Ready 

## 2011-09-19 NOTE — ED Notes (Signed)
Attempted to call report, floor unable to take report, on hold for 5 mins

## 2011-09-19 NOTE — H&P (Signed)
PCP:   Dorrene German, MD, MD   Chief Complaint:  Slurring of speech since yesterday after dialysis.   HPI: 76 year old gentleman with h/o prior strokes, CAD, DM, HTN, on aspirin, reports slurring of speech and difficulty finding words and forgetful since yesterday after dialysis. He came to ED today and underwent a CT HEAD showing an acute left parietal stroke. Pt reports taking aspirin intermittently. He denies any other complaints. He reports visual problems in the right eye since many years.   Review of Systems:  The patient denies anorexia, fever, weight loss,, vision loss, decreased hearing, hoarseness, chest pain, syncope, dyspnea on exertion, peripheral edema, balance deficits, hemoptysis, abdominal pain, melena, hematochezia, severe indigestion/heartburn, hematuria, incontinence, genital sores, muscle weakness, suspicious skin lesions, t difficulty walking, depression, unusual weight change, abnormal bleeding, enlarged lymph nodes, angioedema, and breast masses.  Past Medical History: Past Medical History  Diagnosis Date  . Hyperparathyroidism   . Arthritis   . Hyperlipidemia   . Hypertension   . Chronic kidney disease   . Neuromuscular disorder   . Stroke   . GERD (gastroesophageal reflux disease)   . Headache     occasional  migraine   . Heart attack 2011; 1990's  . Type II diabetes mellitus   . Peripheral neuropathy   . Anxiety   . Depression   . Dialysis patient     M, W, F; [address] (09/19/11)   Past Surgical History  Procedure Date  . Colon surgery 2011    colon resection   . Parathyroidectomy 2011  . Capd insertion 09/12/2011    Procedure: LAPAROSCOPIC INSERTION CONTINUOUS AMBULATORY PERITONEAL DIALYSIS  (CAPD) CATHETER;  Surgeon: Mariella Saa, MD;  Location: [facility] OR;  Service: General;  Laterality: N/A;  . Sp av dialysis shunt access existing *l* 2003  . Dg av dialysis shunt intro ndl*r* or     "not working" (09/19/11)    Medications: Prior to  Admission medications   Medication Sig Start Date End Date Taking? Authorizing Provider  amLODipine (NORVASC) 10 MG tablet Take 10 mg by mouth daily.   Yes Historical Provider, MD  HYDROcodone-acetaminophen (NORCO) 5-325 MG per tablet Take 1-2 tablets by mouth every 4 (four) hours as needed. 09/13/11 09/23/11 Yes Mariella Saa, MD  hydrOXYzine (ATARAX/VISTARIL) 10 MG tablet Take 10 mg by mouth at bedtime as needed. For sleep   Yes Historical Provider, MD  loratadine (CLARITIN) 10 MG tablet Take 10 mg by mouth daily.    Historical Provider, MD    Allergies:   Allergies  Allergen Reactions  . Benazepril Hcl Hives    Social History:  reports that he quit smoking about 35 years ago. His smoking use included Cigarettes. He has a 1.75 pack-year smoking history. He has never used smokeless tobacco. He reports that he drinks alcohol. He reports that he does not use illicit drugs.   Family History: Family History  Problem Relation Age of Onset  . Cancer Sister     breast  . Cancer Brother     lung  . Anesthesia problems Neg Hx   . Hypotension Neg Hx   . Malignant hyperthermia Neg Hx   . Pseudochol deficiency Neg Hx     Physical Exam: Filed Vitals:   09/19/11 0949 09/19/11 0951 09/19/11 1010 09/19/11 1214  BP:  158/78  164/72  Pulse:  85  84  Temp:  98.7 F (37.1 C) 99.7 F (37.6 C) 100.2 F (37.9 C)  TempSrc:  Oral  Oral  Resp:  22  18  Height: 5' (1.524 m)     SpO2:  98%  96%   Constitutional: Vital signs reviewed.  Patient is a well-developed and well-nourished  in no acute distress and cooperative with exam. Alert, but altered.  Head: Normocephalic and atraumatic Mouth: no erythema or exudates, MMM Eyes: right pupil is enlarged., EOMI, conjunctivae normal, No scleral icterus.  Neck: Supple, Trachea midline normal ROM, No JVD, mass, thyromegaly, or carotid bruit present.  Cardiovascular: RRR, S1 normal, S2 normal, no MRG, pulses symmetric and intact  bilaterally Pulmonary/Chest: CTAB, no wheezes, rales, or rhonchi Abdominal: Soft. Mildly tender around the incision site, with areas of erythema and serous draining from the incision site, non-distended, bowel sounds are normal, no masses, organomegaly, or guarding present.  Musculoskeletal: No joint deformities, erythema, or stiffness, ROM full and no nontender Neurological: Alert  but confused. Strenght is normal and symmetric bilaterally, cranial nerve II-XII are grossly intact, no focal motor deficit, sensory intact to light touch bilaterally.  Psychiatric: Normal mood and affect.   Labs on Admission:  No results found for this basename: NA:2,K:2,CL:2,CO2:2,GLUCOSE:2,BUN:2,CREATININE:2,CALCIUM:2,MG:2,PHOS:2 in the last 72 hours No results found for this basename: AST:2,ALT:2,ALKPHOS:2,BILITOT:2,PROT:2,ALBUMIN:2 in the last 72 hours No results found for this basename: LIPASE:2,AMYLASE:2 in the last 72 hours  Basename 09/19/11 1012  WBC 2.2*  NEUTROABS 1.5*  HGB 10.5*  HCT 31.4*  MCV 85.1  PLT 157   No results found for this basename: CKTOTAL:3,CKMB:3,CKMBINDEX:3,TROPONINI:3 in the last 72 hours No results found for this basename: TSH,T4TOTAL,FREET3,T3FREE,THYROIDAB in the last 72 hours No results found for this basename: VITAMINB12:2,FOLATE:2,FERRITIN:2,TIBC:2,IRON:2,RETICCTPCT:2 in the last 72 hours  Radiological Exams on Admission: Dg Chest 2 View  09/19/2011  *RADIOLOGY REPORT*  Clinical Data: Altered mental status  CHEST - 2 VIEW  Comparison: 09/05/2011  Findings: Cardiomediastinal silhouette is stable.  Elevation of the left hemidiaphragm again noted.  Bilateral basilar atelectasis. Stable degenerative changes thoracic spine.  No pulmonary edema. Stable atelectasis or scarring right midlung laterally.  IMPRESSION: Elevation of the left hemidiaphragm.  Bilateral basilar atelectasis.  No focal infiltrate or pulmonary edema.  Original Report Authenticated By: Natasha Mead, M.D.   Dg  Chest 2 View  09/05/2011  *RADIOLOGY REPORT*  Clinical Data: Preop radiograph  CHEST - 2 VIEW  Comparison: 06/27/2009  Findings:  Heart size is normal.  There is no pleural effusion or pulmonary edema.  Slight asymmetric elevation of the left hemidiaphragm is noted.  There is a tortuous, unfolded aorta with calcified atherosclerotic disease.  IMPRESSION:  1.  No acute cardiopulmonary abnormalities.  Original Report Authenticated By: Rosealee Albee, M.D.   Ct Head Wo Contrast  09/19/2011  *RADIOLOGY REPORT*  Clinical Data: Confusion, mental status change  CT HEAD WITHOUT CONTRAST  Technique:  Contiguous axial images were obtained from the base of the skull through the vertex without contrast.  Comparison: CT sinus 01/18/2009 which includes a significant amount of information of the brain.  Findings: Hypodensity left parietal cortex compatible with acute infarct.  This was not present on the prior CT.  No associated hemorrhage.  Moderate to advanced atrophy.  Chronic microvascular ischemic changes throughout the white matter.  Negative for hemorrhage.  Heavy calcification of the carotid vertebral arteries.  No acute abnormality of the skull  IMPRESSION: Left parietal hypodensity, compatible with acute infarct.  Extensive atrophy and chronic microvascular ischemic change.  No acute hemorrhage.  Original Report Authenticated By: Camelia Phenes, M.D.    Assessment/Plan  Present on Admission:  1. Acute CVA: start patient on aspirin. MRI/MRA head and neck are pending.  Further CVA work up pending.  Neurology consult obtained.   2. DM: start the patient on SSI. GET HGBA1C, 3. Hypertension: controlled.  4. ESRD: on HD. Renal on board. Peritoneal Dialysis catheter put in last week. Pt is complaining of pain around the site and serous drainage, with surrounding slight erythema. Started the patient on unasyn and ordered CT Abdomen.  5. Anemia of chronic Disease: get anemia panel.  6. DVT  prophylaxis    After discussion with the patient,he wishes to be a full code. .  We will respect these wishes.  Time spent on this patient including examination and decision-making process: 62 minutes.  Emon Lance [phone] 09/19/2011, 1:55 PM

## 2011-09-19 NOTE — Progress Notes (Signed)
ANTIBIOTIC CONSULT NOTE - INITIAL  Pharmacy Consult for Unasyn Indication: possible infection (CAPD catheter site)  Allergies  Allergen Reactions  . Benazepril Hcl Hives    Patient Measurements: Height: 5' (152.4 cm) IBW/kg (Calculated) : 50  TBW = 101.1kg (09/13/11)  Vital Signs: Temp: 100.2 F (37.9 C) (04/09 1214) Temp src: Oral (04/09 1214) BP: 164/72 mmHg (04/09 1214) Pulse Rate: 84  (04/09 1214) Intake/Output from previous day:   Intake/Output from this shift:    Labs:  Basename 09/19/11 1012  WBC 2.2*  HGB 10.5*  PLT 157  LABCREA --  CREATININE 7.42*   The CrCl is unknown because both a height and weight (above a minimum accepted value) are required for this calculation. No results found for this basename: VANCOTROUGH:2,VANCOPEAK:2,VANCORANDOM:2,GENTTROUGH:2,GENTPEAK:2,GENTRANDOM:2,TOBRATROUGH:2,TOBRAPEAK:2,TOBRARND:2,AMIKACINPEAK:2,AMIKACINTROU:2,AMIKACIN:2, in the last 72 hours   Microbiology: Recent Results (from the past 720 hour(s))  SURGICAL PCR SCREEN     Status: Normal   Collection Time   09/05/11  8:46 AM      Component Value Range Status Comment   MRSA, PCR NEGATIVE  NEGATIVE  Final    Staphylococcus aureus NEGATIVE  NEGATIVE  Final   GRAM STAIN     Status: Normal   Collection Time   09/19/11 11:47 AM      Component Value Range Status Comment   Specimen Description PERITONEAL CAVITY   Final    Special Requests NONE   Final    Gram Stain     Final    Value: ABUNDANT WBC PRESENT,BOTH PMN AND MONONUCLEAR     NO ORGANISMS SEEN   Report Status 09/19/2011 FINAL   Final     Medical History: Past Medical History  Diagnosis Date  . Hyperparathyroidism   . Arthritis   . Hyperlipidemia   . Hypertension   . Chronic kidney disease   . Neuromuscular disorder   . Stroke   . GERD (gastroesophageal reflux disease)   . Headache     occasional  migraine   . Heart attack 2011; 1990's  . Type II diabetes mellitus   . Peripheral neuropathy   . Anxiety    . Depression   . Dialysis patient     M, W, F; [address] (09/19/11)    Medications:  Prior to admission meds:  Amlodipine,  Hydrocodone-APAP, Hydroxyzine and Loratadine  Assessment: 76 y.o. Male with onset of confusion yesterday and worse symptoms today. Tc= 100.2.  Recently s/p CAPD catheter placement 09/12/11 for ESRD.  Area surrounding catheter is warm to touch, reddened, slightly hardened with clear yellow/orange fluid leaking around the PD cath insertion site per RN's assessment. Fluid sent to lab for culture.    Plan:  Unasyn 3gm IV q24hrs.  F/u cultures.   Noah Delaine, RPh Clinical Pharmacist 09/19/2011,2:24 PM

## 2011-09-19 NOTE — Progress Notes (Signed)
Pt. And family refusing any further lab draws secondary to being "stuck so many times in the ER." Notified Dr. Blake Divine.

## 2011-09-19 NOTE — ED Notes (Signed)
This RN unable to find IV site, pt and family report patient usually needs IV team, IV team paged and will be down shortly to start a line

## 2011-09-19 NOTE — Progress Notes (Signed)
09/19/2011 11:55 AM PD catheter accessed  Briefly to obtain labs per MD order. Pt states the last flush he had was after PD cath was inserted last week, and was scheduled to go to the center to have another one today but ended up here instead.  Area surrounding catheter is warm to touch, reddened, and slightly hardened. Some clear yellow/orange fluid is also leaking around the PD cath insertion site when palpated. Tenderness noted to whole area.  When accessed, only the bloody (dark brown) fluid in the catheter extension drained into the bag, no other fluid in abdomen. Fluid sent to lab. Dr. Arlean Hopping with renal notified of patient's arrival. Eunice Blase

## 2011-09-19 NOTE — ED Notes (Signed)
Patient with onset of confusion on yesterday and worse sx today.  Patient states he cannot focus.  Patient with some diff talking as well.  Patient is a dialysis patient, last tx was yesterday.  Patient is tearful.  He states he has pressure on the left posterior side of his head.

## 2011-09-19 NOTE — ED Notes (Signed)
Daughter requests that IV nurse and phlebotomy stop sticking her father, both left the room, Dr Jamas Lav aware

## 2011-09-19 NOTE — Consult Note (Signed)
Chief Complaint: Speech difficulty and confusion  HPI: Sean [name] is an 76 y.o. male with a history of 2 previous small strokes as well as hypertension, diabetes mellitus and end-stage renal disease on dialysis presenting with onset of speech difficulty starting yesterday following his dialysis session and demonstrating worsening today. He has demonstrated word finding difficulty as well as apparent difficulty understanding what is being said to him at times. CT scan of his head showed findings consistent with acute left parietal stroke. Patient has not been on antiplatelet therapy. NIH stroke score was 3.  LSN: 09/18/2011 approximately 1500 tPA Given: No: Beyond time window for treatment MRankin: 0  Past Medical History  Diagnosis Date  . Hyperparathyroidism   . Arthritis   . Hyperlipidemia   . Hypertension   . Chronic kidney disease   . Neuromuscular disorder   . Stroke   . GERD (gastroesophageal reflux disease)   . Headache     occasional  migraine   . Heart attack 2011; 1990's  . Type II diabetes mellitus   . Peripheral neuropathy   . Anxiety   . Depression   . Dialysis patient     M, W, F; [address] (09/19/11)    Family History  Problem Relation Age of Onset  . Cancer Sister     breast  . Cancer Brother     lung  . Anesthesia problems Neg Hx   . Hypotension Neg Hx   . Malignant hyperthermia Neg Hx   . Pseudochol deficiency Neg Hx      Medications: Prior to Admission:  Norvasc 10 mg per day Norco one to 2 every 4 hours when necessary Hydroxyzine 10 mg at bedtime when necessary NovoLog 0-5 units each bedtime NovoLog 0-9 units 3 times a day Claritin 10 mg per day  Physical Examination: Blood pressure 174/81, pulse 88, temperature 99.2 F (37.3 C), temperature source Oral, resp. rate 20, height 5' (1.524 m), SpO2 97.00%.  Neurologic Examination: Mental Status: Alert, oriented, thought content appropriate.  Moderate expressive aphasia with no  signs of receptive aphasia. Able to follow commands without difficulty. Cranial Nerves: II-right homonymous hemianopsia III/IV/VI-the pupils normal in size and reacted normally to light. Right pupil is enlarged and irregular from previous surgery and did not react to light. Extraocular movements were full and conjugate.    V/VII-no facial numbness and no facial weakness. VIII-normal. X-normal speech with no dysarthria. XII-midline tongue extension Motor: 5/5 bilaterally with normal tone and bulk Sensory: Normal throughout including vibratory sensation distally in his feet. Deep Tendon Reflexes: 1+ and symmetric. Plantars: Mute bilaterally Cerebellar: Normal finger-to-nose testing. Carotid auscultation: Normal   Dg Chest 2 View  09/19/2011  *RADIOLOGY REPORT*  Clinical Data: Altered mental status  CHEST - 2 VIEW  Comparison: 09/05/2011  Findings: Cardiomediastinal silhouette is stable.  Elevation of the left hemidiaphragm again noted.  Bilateral basilar atelectasis. Stable degenerative changes thoracic spine.  No pulmonary edema. Stable atelectasis or scarring right midlung laterally.  IMPRESSION: Elevation of the left hemidiaphragm.  Bilateral basilar atelectasis.  No focal infiltrate or pulmonary edema.  Original Report Authenticated By: Natasha Mead, M.D.   Ct Head Wo Contrast  09/19/2011  *RADIOLOGY REPORT*  Clinical Data: Confusion, mental status change  CT HEAD WITHOUT CONTRAST  Technique:  Contiguous axial images were obtained from the base of the skull through the vertex without contrast.  Comparison: CT sinus 01/18/2009 which includes a significant amount of information of the brain.  Findings: Hypodensity left parietal  cortex compatible with acute infarct.  This was not present on the prior CT.  No associated hemorrhage.  Moderate to advanced atrophy.  Chronic microvascular ischemic changes throughout the white matter.  Negative for hemorrhage.  Heavy calcification of the carotid vertebral  arteries.  No acute abnormality of the skull  IMPRESSION: Left parietal hypodensity, compatible with acute infarct.  Extensive atrophy and chronic microvascular ischemic change.  No acute hemorrhage.  Original Report Authenticated By: Camelia Phenes, M.D.    Assessment: 76 y.o. male acute left parietal ischemic infarction with expressive aphasia and right visual field defect.  Stroke Risk Factors - diabetes mellitus, family history and hypertension  Plan: 1. HgbA1c, fasting lipid panel 2. MRI, MRA  of the brain without contrast 3. PT consult, OT consult, Speech consult 4. Echocardiogram 5. Carotid dopplers 6. Prophylactic therapy-Antiplatelet med: Aspirin 81 mg per day 7. Risk factor modification 8. Telemetry monitoring  C.R. Roseanne Reno, MD [facility] [phone]  09/19/2011, 2:49 PM

## 2011-09-19 NOTE — ED Notes (Signed)
Allyson from 6700 at the bedside to draw fluid from PD catheter

## 2011-09-19 NOTE — Consult Note (Signed)
[facility] Renal Consultation Note  Indication for Consultation:  Management of ESRD/hemodialysis; anemia, hypertension/volume and secondary hyperparathyroidism  HPI: Sean [name] is a 75 y.o. male with ESRD on HD on MWF at [facility] who presented to the ED this morning with dizziness, headache, confusion, difficulty speaking, blurred vision on the right, and general weakness since yesterday afternoon.  On April 2 he had a PD catheter placed by Dr. Johna Sheriff to convert to peritoneal dialysis.  He lives alone, and when his daughter arrived this morning to take him to his follow-up with the surgeon, he came to the ED instead.  CT of the head showed left parietal hypodensity, compatible with an acute infarct.  He also has mild tenderness around the area of his PD catheter and is concerned for infection.  Dialysis Orders: Center: [facility] on MWF . EDW 103 kg  HD Bath 2K/2.25Ca  Time 4 hrs  Heparin 9600 U. Access AVG @ LFA BFR 450 DFR 800    Zemplar 7 mcg IV/HD  Epogen 0 Units IV/HD  Venofer  0   Past Medical History  Diagnosis Date  . Hyperparathyroidism   . Arthritis   . Hyperlipidemia   . Hypertension   . Chronic kidney disease   . Neuromuscular disorder   . Stroke   . GERD (gastroesophageal reflux disease)   . Headache     occasional  migraine   . Heart attack 2011; 1990's  . Type II diabetes mellitus   . Peripheral neuropathy   . Anxiety   . Depression   . Dialysis patient     M, W, F; [address] (09/19/11)   Past Surgical History  Procedure Date  . Colon surgery 2011    colon resection   . Parathyroidectomy 2011  . Capd insertion 09/12/2011    Procedure: LAPAROSCOPIC INSERTION CONTINUOUS AMBULATORY PERITONEAL DIALYSIS  (CAPD) CATHETER;  Surgeon: Mariella Saa, MD;  Location: [facility] OR;  Service: General;  Laterality: N/A;  . Sp av dialysis shunt access existing *l* 2003  . Dg av dialysis shunt intro ndl*r* or     "not working" (09/19/11)   Family History    Problem Relation Age of Onset  . Cancer Sister     breast  . Cancer Brother     lung  . Anesthesia problems Neg Hx   . Hypotension Neg Hx   . Malignant hyperthermia Neg Hx   . Pseudochol deficiency Neg Hx    Social History He reports that he quit smoking cigarettes in 1975 after a 1.75 pack-year smoking history. He has never used smokeless tobacco. He reports that he drinks alcohol occasionally. He reports that he does not use illicit drugs. He is separated from his wife and has two daughters and one son.  He previously worked as a Geophysicist/field seismologist.  Allergies  Allergen Reactions  . Benazepril Hcl Hives   Prior to Admission medications   Medication Sig Start Date End Date Taking? Authorizing Provider  amLODipine (NORVASC) 10 MG tablet Take 10 mg by mouth daily.   Yes Historical Provider, MD  HYDROcodone-acetaminophen (NORCO) 5-325 MG per tablet Take 1-2 tablets by mouth every 4 (four) hours as needed. 09/13/11 09/23/11 Yes Mariella Saa, MD  hydrOXYzine (ATARAX/VISTARIL) 10 MG tablet Take 10 mg by mouth at bedtime as needed. For sleep   Yes Historical Provider, MD  loratadine (CLARITIN) 10 MG tablet Take 10 mg by mouth daily.    Historical Provider, MD  Labs:  Results for orders placed during the hospital encounter of 09/19/11 (from the past 48 hour(s))  BASIC METABOLIC PANEL     Status: Abnormal   Collection Time   09/19/11 10:12 AM      Component Value Range Comment   Sodium 133 (*) 135 - 145 (mEq/L)    Potassium 3.6  3.5 - 5.1 (mEq/L)    Chloride 92 (*) 96 - 112 (mEq/L)    CO2 28  19 - 32 (mEq/L)    Glucose, Bld 90  70 - 99 (mg/dL)    BUN 28 (*) 6 - 23 (mg/dL)    Creatinine, Ser 4.78 (*) 0.50 - 1.35 (mg/dL)    Calcium 9.2  8.4 - 10.5 (mg/dL)    GFR calc non Af Amer 6 (*) >90 (mL/min)    GFR calc Af Amer 7 (*) >90 (mL/min)   CBC     Status: Abnormal   Collection Time   09/19/11 10:12 AM      Component Value Range Comment   WBC 2.2 (*) 4.0 - 10.5 (K/uL)    RBC  3.69 (*) 4.22 - 5.81 (MIL/uL)    Hemoglobin 10.5 (*) 13.0 - 17.0 (g/dL)    HCT 29.5 (*) 62.1 - 52.0 (%)    MCV 85.1  78.0 - 100.0 (fL)    MCH 28.5  26.0 - 34.0 (pg)    MCHC 33.4  30.0 - 36.0 (g/dL)    RDW 30.8  65.7 - 84.6 (%)    Platelets 157  150 - 400 (K/uL)   DIFFERENTIAL     Status: Abnormal   Collection Time   09/19/11 10:12 AM      Component Value Range Comment   Neutrophils Relative 65  43 - 77 (%)    Neutro Abs 1.5 (*) 1.7 - 7.7 (K/uL)    Lymphocytes Relative 31  12 - 46 (%)    Lymphs Abs 0.7  0.7 - 4.0 (K/uL)    Monocytes Relative 3  3 - 12 (%)    Monocytes Absolute 0.1  0.1 - 1.0 (K/uL)    Eosinophils Relative 0  0 - 5 (%)    Eosinophils Absolute 0.0  0.0 - 0.7 (K/uL)    Basophils Relative 1  0 - 1 (%)    Basophils Absolute 0.0  0.0 - 0.1 (K/uL)   BODY FLUID CELL COUNT WITH DIFFERENTIAL     Status: Abnormal   Collection Time   09/19/11 11:46 AM      Component Value Range Comment   Fluid Type-FCT PERITONEAL DIALYSIS      Color, Fluid BROWN (*) YELLOW     Appearance, Fluid TURBID (*) CLEAR     WBC, Fluid 6040 (*) 0 - 1000 (cu mm)    Other Cells, Fluid        Value: UNABLE TO PERFORM DIFFERENTIAL DUE TO DEGENERATION OF WBCS.  GRAM STAIN     Status: Normal   Collection Time   09/19/11 11:47 AM      Component Value Range Comment   Specimen Description PERITONEAL CAVITY      Special Requests NONE      Gram Stain        Value: ABUNDANT WBC PRESENT,BOTH PMN AND MONONUCLEAR     NO ORGANISMS SEEN   Report Status 09/19/2011 FINAL     LACTIC ACID, PLASMA     Status: Normal   Collection Time   09/19/11  1:14 PM      Component Value  Range Comment   Lactic Acid, Venous 0.7  0.5 - 2.2 (mmol/L)    Constitutional: negative for chills, fatigue, fevers and sweats Eyes: positive for blurred vision on right Ears, nose, mouth, throat, and face: negative for hearing loss, hoarseness, nasal congestion and sore throat Respiratory: negative for cough, dyspnea on exertion and  wheezing Cardiovascular: negative for chest pain, chest pressure/discomfort, dyspnea, fatigue, orthopnea and palpitations Gastrointestinal: negative for abdominal pain, diarrhea, nausea and vomiting Genitourinary:anuric Musculoskeletal:negative for arthralgias, back pain, muscle weakness and myalgias Neurological: positive for dizziness, headaches, speech problems and weakness  Physical Exam: Filed Vitals:   09/19/11 1442  BP: 174/81  Pulse: 88  Temp: 99.2 F (37.3 C)  Resp: 20     General appearance: alert, cooperative and no distress Head: Normocephalic, without obvious abnormality, atraumatic Throat: lips, mucosa, and tongue normal; teeth and gums normal Neck: no adenopathy, no carotid bruit, no JVD and supple, symmetrical, trachea midline Resp: clear to auscultation bilaterally Cardio: regular rate and rhythm, S1, S2 normal, no murmur, click, rub or gallop GI: + BS, soft, nontender, but with ecchymosis surrounding PD catheter site on right, no drainage at exit site, some induration and hematoma post-op, minimally tender Extremities: extremities normal, atraumatic, no cyanosis or edema Neurologic: Grossly normal, alert and oriented, but with difficulty speaking Dialysis Access: AVG @ LFA with + bruit   Assessment/Plan 1.  Acute left parietal infarction - per CT, with expressive aphasia and right vision defect; history of CVA in '00, on ASA, seen by Neurology.  MRI, MRA of brain, echocardiogram, carotid Dopplers, PT, speech consult pending.   2.  ESRD -  On HD on MWF, K stable at 3.8 s/p HD yesterday.  Next HD tomorrow. 3.  PD catheter - ? Infection, evaluated by Surgery, began IV Unasyn.  Peritoneal fluid cultures pending. 4.  Hypertension/volume  - BP currently elevated at 174/81, outpatient meds are Norvasc 5 mg qhs and Metoprolol 25 mg bid; chest x-ray shows bilateral basilar atelectasis without edema or infiltrate, usually fluid gains 2-3 L. 5.  Anemia  - Hgb 10.5 off Epogen.   6.  Metabolic bone disease -  Ca 9.1, last P 7 on 3/20; on Zemplar 7 mcg per HD, Tums with meals. 7. Nutrition - last Alb 4 on 3/20. 8. DM - on Insulin. 9. PVD - on Plavix as outpatient.  LYLES,CHARLES 09/19/2011, 3:41 PM   Attending Nephrologist: Delano Metz, MD  Patient seen and examined and agree with assessment and plan as above with additions in bold.   Vinson Moselle  MD [facility] [phone] pgr    [phone] cell 09/19/2011, 10:47 PM

## 2011-09-19 NOTE — Consult Note (Signed)
Reason for Consult:Possible infection CAPD catheter Referring Physician: Caydin Yeatts is an 76 y.o. male.  HPI: Placement of a CAPD catheter on April 2 [place]. HE was evaluated in the emergency department today for neurologic symptoms. He was found to have an acute stroke. He is being admitted to the medical service. Neurology is consulting. The family notes they are concerned that the CAPD catheter is infected.  Past Medical History  Diagnosis Date  . Hyperparathyroidism   . Arthritis   . Hyperlipidemia   . Hypertension   . Chronic kidney disease   . Neuromuscular disorder   . Stroke   . GERD (gastroesophageal reflux disease)   . Headache     occasional  migraine   . Heart attack 2011; 1990's  . Type II diabetes mellitus   . Peripheral neuropathy   . Anxiety   . Depression   . Dialysis patient     M, W, F; [address] (09/19/11)    Past Surgical History  Procedure Date  . Colon surgery 2011    colon resection   . Parathyroidectomy 2011  . Capd insertion 09/12/2011    Procedure: LAPAROSCOPIC INSERTION CONTINUOUS AMBULATORY PERITONEAL DIALYSIS  (CAPD) CATHETER;  Surgeon: Mariella Saa, MD;  Location: [facility] OR;  Service: General;  Laterality: N/A;  . Sp av dialysis shunt access existing *l* 2003  . Dg av dialysis shunt intro ndl*r* or     "not working" (09/19/11)    Family History  Problem Relation Age of Onset  . Cancer Sister     breast  . Cancer Brother     lung  . Anesthesia problems Neg Hx   . Hypotension Neg Hx   . Malignant hyperthermia Neg Hx   . Pseudochol deficiency Neg Hx     Social History:  reports that he quit smoking about 35 years ago. His smoking use included Cigarettes. He has a 1.75 pack-year smoking history. He has never used smokeless tobacco. He reports that he drinks alcohol. He reports that he does not use illicit drugs.  Allergies:  Allergies  Allergen Reactions  . Benazepril Hcl Hives    Medications:  reviewed  Results for orders placed during the hospital encounter of 09/19/11 (from the past 48 hour(s))  BASIC METABOLIC PANEL     Status: Abnormal   Collection Time   09/19/11 10:12 AM      Component Value Range Comment   Sodium 133 (*) 135 - 145 (mEq/L)    Potassium 3.6  3.5 - 5.1 (mEq/L)    Chloride 92 (*) 96 - 112 (mEq/L)    CO2 28  19 - 32 (mEq/L)    Glucose, Bld 90  70 - 99 (mg/dL)    BUN 28 (*) 6 - 23 (mg/dL)    Creatinine, Ser 1.61 (*) 0.50 - 1.35 (mg/dL)    Calcium 9.2  8.4 - 10.5 (mg/dL)    GFR calc non Af Amer 6 (*) >90 (mL/min)    GFR calc Af Amer 7 (*) >90 (mL/min)   CBC     Status: Abnormal   Collection Time   09/19/11 10:12 AM      Component Value Range Comment   WBC 2.2 (*) 4.0 - 10.5 (K/uL)    RBC 3.69 (*) 4.22 - 5.81 (MIL/uL)    Hemoglobin 10.5 (*) 13.0 - 17.0 (g/dL)    HCT 09.6 (*) 04.5 - 52.0 (%)    MCV 85.1  78.0 - 100.0 (fL)  MCH 28.5  26.0 - 34.0 (pg)    MCHC 33.4  30.0 - 36.0 (g/dL)    RDW 16.1  09.6 - 04.5 (%)    Platelets 157  150 - 400 (K/uL)   DIFFERENTIAL     Status: Abnormal   Collection Time   09/19/11 10:12 AM      Component Value Range Comment   Neutrophils Relative 65  43 - 77 (%)    Neutro Abs 1.5 (*) 1.7 - 7.7 (K/uL)    Lymphocytes Relative 31  12 - 46 (%)    Lymphs Abs 0.7  0.7 - 4.0 (K/uL)    Monocytes Relative 3  3 - 12 (%)    Monocytes Absolute 0.1  0.1 - 1.0 (K/uL)    Eosinophils Relative 0  0 - 5 (%)    Eosinophils Absolute 0.0  0.0 - 0.7 (K/uL)    Basophils Relative 1  0 - 1 (%)    Basophils Absolute 0.0  0.0 - 0.1 (K/uL)   BODY FLUID CELL COUNT WITH DIFFERENTIAL     Status: Abnormal   Collection Time   09/19/11 11:46 AM      Component Value Range Comment   Fluid Type-FCT PERITONEAL DIALYSIS      Color, Fluid BROWN (*) YELLOW     Appearance, Fluid TURBID (*) CLEAR     WBC, Fluid 6040 (*) 0 - 1000 (cu mm)    Other Cells, Fluid        Value: UNABLE TO PERFORM DIFFERENTIAL DUE TO DEGENERATION OF WBCS.  GRAM STAIN     Status:  Normal   Collection Time   09/19/11 11:47 AM      Component Value Range Comment   Specimen Description PERITONEAL CAVITY      Special Requests NONE      Gram Stain        Value: ABUNDANT WBC PRESENT,BOTH PMN AND MONONUCLEAR     NO ORGANISMS SEEN   Report Status 09/19/2011 FINAL     LACTIC ACID, PLASMA     Status: Normal   Collection Time   09/19/11  1:14 PM      Component Value Range Comment   Lactic Acid, Venous 0.7  0.5 - 2.2 (mmol/L)     Dg Chest 2 View  09/19/2011  *RADIOLOGY REPORT*  Clinical Data: Altered mental status  CHEST - 2 VIEW  Comparison: 09/05/2011  Findings: Cardiomediastinal silhouette is stable.  Elevation of the left hemidiaphragm again noted.  Bilateral basilar atelectasis. Stable degenerative changes thoracic spine.  No pulmonary edema. Stable atelectasis or scarring right midlung laterally.  IMPRESSION: Elevation of the left hemidiaphragm.  Bilateral basilar atelectasis.  No focal infiltrate or pulmonary edema.  Original Report Authenticated By: Natasha Mead, M.D.   Ct Head Wo Contrast  09/19/2011  *RADIOLOGY REPORT*  Clinical Data: Confusion, mental status change  CT HEAD WITHOUT CONTRAST  Technique:  Contiguous axial images were obtained from the base of the skull through the vertex without contrast.  Comparison: CT sinus 01/18/2009 which includes a significant amount of information of the brain.  Findings: Hypodensity left parietal cortex compatible with acute infarct.  This was not present on the prior CT.  No associated hemorrhage.  Moderate to advanced atrophy.  Chronic microvascular ischemic changes throughout the white matter.  Negative for hemorrhage.  Heavy calcification of the carotid vertebral arteries.  No acute abnormality of the skull  IMPRESSION: Left parietal hypodensity, compatible with acute infarct.  Extensive atrophy and chronic microvascular  ischemic change.  No acute hemorrhage.  Original Report Authenticated By: Camelia Phenes, M.D.    Review of Systems   Unable to perform ROS: mental status change   Blood pressure 174/81, pulse 88, temperature 99.2 F (37.3 C), temperature source Oral, resp. rate 20, height 5' (1.524 m), SpO2 97.00%. Physical Exam  Constitutional: He appears well-developed.  Cardiovascular: Normal rate and normal heart sounds.   Respiratory: Effort normal and breath sounds normal.  GI: Soft. He exhibits no distension. There is no tenderness. There is no rebound and no guarding.       Incisions clean dry and intact, minimal clear fluid draining next to CAPD catheter, some resolving ecchymoses are present around the insertion site but no cellulitis  Neurological:       Some confused speech    Assessment/Plan: 7 days status post insertion of CAPD catheter now with acute stroke.  There does not appear to be an acute infection of the CAPD site. The patient is being admitted and has been placed on antibiotics. Peritoneal fluid cultures were taken from the catheter. I will discuss his case with Dr. Johna Sheriff and we will follow along.  Rohen Kimes E 09/19/2011, 2:46 PM

## 2011-09-20 ENCOUNTER — Inpatient Hospital Stay (HOSPITAL_COMMUNITY): Payer: Medicare Other

## 2011-09-20 LAB — LIPID PANEL: Cholesterol: 161 mg/dL (ref 0–200)

## 2011-09-20 LAB — CBC
HCT: 32.4 % — ABNORMAL LOW (ref 39.0–52.0)
HCT: 29.2 % — ABNORMAL LOW (ref 39.0–52.0)
Hemoglobin: 10.7 g/dL — ABNORMAL LOW (ref 13.0–17.0)
MCV: 84.4 fL (ref 78.0–100.0)
MCV: 83.4 fL (ref 78.0–100.0)
Platelets: 160 10*3/uL (ref 150–400)
Platelets: 164 10*3/uL (ref 150–400)
RBC: 3.84 MIL/uL — ABNORMAL LOW (ref 4.22–5.81)
RBC: 3.5 MIL/uL — ABNORMAL LOW (ref 4.22–5.81)
WBC: 1.7 10*3/uL — ABNORMAL LOW (ref 4.0–10.5)
WBC: 1.8 10*3/uL — ABNORMAL LOW (ref 4.0–10.5)

## 2011-09-20 LAB — RENAL FUNCTION PANEL
Albumin: 2.7 g/dL — ABNORMAL LOW (ref 3.5–5.2)
CO2: 28 mEq/L (ref 19–32)
Chloride: 94 mEq/L — ABNORMAL LOW (ref 96–112)
Chloride: 92 mEq/L — ABNORMAL LOW (ref 96–112)
Creatinine, Ser: 9.29 mg/dL — ABNORMAL HIGH (ref 0.50–1.35)
GFR calc Af Amer: 6 mL/min — ABNORMAL LOW (ref >90)
GFR calc non Af Amer: 5 mL/min — ABNORMAL LOW (ref >90)
Glucose, Bld: 135 mg/dL — ABNORMAL HIGH (ref 70–99)
Phosphorus: 4.2 mg/dL (ref 2.3–4.6)
Potassium: 4.1 mEq/L (ref 3.5–5.1)
Potassium: 3.7 mEq/L (ref 3.5–5.1)
Sodium: 135 mEq/L (ref 135–145)

## 2011-09-20 LAB — HEMOGLOBIN A1C: Hgb A1c MFr Bld: 6.5 % — ABNORMAL HIGH (ref <5.7)

## 2011-09-20 LAB — PATHOLOGIST SMEAR REVIEW

## 2011-09-20 LAB — GLUCOSE, CAPILLARY
Glucose-Capillary: 106 mg/dL — ABNORMAL HIGH (ref 70–99)
Glucose-Capillary: 106 mg/dL — ABNORMAL HIGH (ref 70–99)

## 2011-09-20 MED ORDER — ATORVASTATIN CALCIUM 20 MG PO TABS
20.0000 mg | ORAL_TABLET | Freq: Every day | ORAL | Status: DC
  Administered 2011-09-21: 20 mg via ORAL
  Filled 2011-09-20 (×3): qty 1

## 2011-09-20 MED ORDER — DELFLEX-LC/1.5% DEXTROSE 346 MOSM/L IP SOLN: Freq: Once | INTRAPERITONEAL | Status: DC

## 2011-09-20 MED ORDER — SIMVASTATIN 40 MG PO TABS
40.0000 mg | ORAL_TABLET | Freq: Every day | ORAL | Status: DC
  Filled 2011-09-20: qty 1

## 2011-09-20 MED ORDER — PARICALCITOL 5 MCG/ML IV SOLN
INTRAVENOUS | Status: AC
  Administered 2011-09-20: 7 ug via INTRAVENOUS
  Filled 2011-09-20: qty 2

## 2011-09-20 NOTE — Evaluation (Signed)
Speech Language Pathology Evaluation Patient Details Name: Sean [name] MRN: [id number] DOB: 02-14-1936 Today's Date: 09/20/2011  Problem List:  Patient Active Problem List  Diagnoses  . HYPERLIPIDEMIA  . ANXIETY  . HYPERTENSION  . MI  . End stage renal disease  . HEADACHE  . COUGH  . CEREBROVASCULAR ACCIDENT, HX OF  . CVA (cerebral infarction)  . Diabetes mellitus due to underlying condition   Past Medical History:  Past Medical History  Diagnosis Date  . Hyperparathyroidism   . Arthritis   . Hyperlipidemia   . Hypertension   . Chronic kidney disease   . Neuromuscular disorder   . Stroke   . GERD (gastroesophageal reflux disease)   . Headache     occasional  migraine   . Heart attack 2011; 1990's  . Type II diabetes mellitus   . Peripheral neuropathy   . Anxiety   . Depression   . Dialysis patient     M, W, F; [address] (09/19/11)   Past Surgical History:  Past Surgical History  Procedure Date  . Colon surgery 2011    colon resection   . Parathyroidectomy 2011  . Capd insertion 09/12/2011    Procedure: LAPAROSCOPIC INSERTION CONTINUOUS AMBULATORY PERITONEAL DIALYSIS  (CAPD) CATHETER;  Surgeon: Mariella Saa, MD;  Location: [facility] OR;  Service: General;  Laterality: N/A;  . Sp av dialysis shunt access existing *l* 2003  . Dg av dialysis shunt intro ndl*r* or     "not working" (09/19/11)    SLP Assessment/Plan/Recommendation Assessment Clinical Impression Statement: Sean [name] is a 76 y/o male s/p CVA who presents with mild to moderate expressive language deficits in the areas of word finding at sentence and conversation levels; mild dysfluencies are also noted in conversation. Mildly complex auditory comprehension deficits are also noted. Decreased selective attention to task appears to significantly impact participation in evaluation and overall language performance.     SLP Recommendation/Assessment: Patient will need skilled Speech Language  Pathology Services in the acute care venue to address identified deficits Problem List: Auditory comprehension;Verbal expression;Orientation;Attention;Problem Solving Therapy Diagnosis: Speech and Language deficits Type of Aphasia:  (mild to moderate word finding, mildly decreased comprehension, moderate attention deficits) Problem List Comments: Sean [name] would benefit from skilled ST in the acute venue to address strategies for word finding and comprehension as well as attention skills.  Plan Speech Therapy Frequency: min 2x/week Duration: 2 weeks Treatment/Interventions: Language facilitation;Environmental controls;SLP instruction and feedback;Cueing hierarchy;Compensatory strategies;Patient/family education Potential to Achieve Goals: Good Potential Considerations: Ability to learn/carryover information SLP Recommendations Follow up Recommendations: Outpatient SLP Individuals Consulted Consulted and Agree with Results and Recommendations: Patient  SLP Goals  SLP Goals Potential to Achieve Goals: Good Potential Considerations: Ability to learn/carryover information Progress/Goals/Alternative treatment plan discussed with pt/caregiver and they: Agree SLP Goal #1: Pt will demonstrate understanding of word finding strategies at sentence level with mod. verbal cues from therapist   SLP Goal #2: Pt will demonstrate sustained attention to therapy tasks with minimal assist. for 5 mins. in queit environment   SLP Goal #3: Pt will respond appropriately to complex yes/no questions with 100% accuracy and minimal verbal cues from therapist.   SLP Evaluation Prior Functioning Cognitive/Linguistic Baseline: Information not available (doesn't appear to have baseline deficits, no family present ) Type of Home: Apartment Lives With: Alone Receives Help From: Family (three daughters, Pt states they live close by) Education: complete high school   Cognition Overall Cognitive Status: Impaired  (difficulty with attention to  task ) Arousal/Alertness: Lethargic Orientation Level: Oriented to situation;Oriented to person;Disoriented to time;Oriented to place (needed cues for year; did not know address) Attention: Focused;Sustained (needs mod-max cues to maintain sustained attention to task ) Focused Attention: Appears intact Sustained Attention: Impaired Sustained Attention Impairment: Verbal basic;Functional basic Memory:  (attention negatively impacted memory assessment today ) Awareness: Impaired Awareness Impairment: Intellectual impairment (aware of stroke; vague description of focus & speech deficit) Problem Solving: Impaired Problem Solving Impairment: Verbal basic (uses call bell appropriately ) Safety/Judgment: Appears intact (aware of call bell; and not to get up alone )  Comprehension Auditory Comprehension Overall Auditory Comprehension: Impaired Yes/No Questions: Impaired Basic Immediate Environment Questions: 75-100% accurate (100%) Complex Questions: 75-100% accurate (80%) Commands: Impaired Two Step Basic Commands: 50-74% accurate Multistep Basic Commands: 0-24% accurate Conversation: Simple Other Conversation Comments: pt states awareness that is poor focus is affecting ability to respond to directions; also states he is HOH on right side Interfering Components: Attention;Pain EffectiveTechniques: Extra processing time;Increased volume Visual Recognition/Discrimination Discrimination: Not tested Reading Comprehension Reading Status: Not tested (NT due to HA and attention )  Expression Expression Primary Mode of Expression: Verbal Verbal Expression Overall Verbal Expression: Impaired Initiation: No impairment Level of Generative/Spontaneous Verbalization: Sentence;Conversation Repetition: Impaired Level of Impairment: Phrase level Naming: No impairment (100% for simple and responsive naming, single word response ) Convergent: Not tested Divergent: Not  tested Verbal Errors: Perseveration;Aware of errors;Other (comment) (word finding evident at sentence and conversation levels ) Pragmatics: No impairment Interfering Components: Attention Other Verbal Expression Comments: mild dysfluencies noted at sentence and conversation levels  Written Expression Written Expression: Not tested  Oral/Motor Oral Motor/Sensory Function Overall Oral Motor/Sensory Function: Appears within functional limits for tasks assessed Motor Speech Overall Motor Speech: Appears within functional limits for tasks assessed Intelligibility: Intelligible  Waldo Laine 09/20/2011, 12:13 PM

## 2011-09-20 NOTE — Progress Notes (Signed)
Subjective:  Alert, aphasic, no complaints.   Objective:    Vital signs in last 24 hours: Filed Vitals:   09/20/11 0200 09/20/11 0400 09/20/11 0647 09/20/11 1000  BP: 146/75 164/74 145/70 153/77  Pulse: 72 69 74 70  Temp: 99 F (37.2 C) 98.9 F (37.2 C) 98.6 F (37 C) 99.1 F (37.3 C)  TempSrc: Oral Oral Oral Oral  Resp: 20 20 22 20   Height:      Weight:      SpO2: 96% 96% 92% 94%   Weight change:  No intake or output data in the 24 hours ending 09/20/11 1033 Labs: Basic Metabolic Panel:  Lab 09/20/11 9604 09/19/11 1356 09/19/11 1012  NA 135 137 133*  K 4.1 3.8 3.6  CL 94* 94* 92*  CO2 28 28 28   GLUCOSE 135* 82 90  BUN 38* 28* 28*  CREATININE 9.07* 7.33* 7.42*  ALB -- -- --  CALCIUM 9.3 9.1 9.2  PHOS 4.2 -- --   Liver Function Tests:  Lab 09/20/11 0822  AST --  ALT --  ALKPHOS --  BILITOT --  PROT --  ALBUMIN 2.9*   No results found for this basename: LIPASE:3,AMYLASE:3 in the last 168 hours No results found for this basename: AMMONIA:3 in the last 168 hours CBC:  Lab 09/20/11 0822 09/19/11 1012  WBC 1.7* 2.2*  NEUTROABS -- 1.5*  HGB 10.7* 10.5*  HCT 32.4* 31.4*  MCV 84.4 85.1  PLT 160 157   Cardiac Enzymes: No results found for this basename: CKTOTAL:5,CKMB:5,CKMBINDEX:5,TROPONINI:5 in the last 168 hours CBG:  Lab 09/20/11 0644 09/19/11 2122 09/19/11 1804  GLUCAP 90 110* 87    Iron Studies: No results found for this basename: IRON:30,TIBC:30,SATURATION RATIOS:30,TRANSFERRIN:30,FERRITIN:30 in the last 168 hours Studies/Results: Dg Chest 2 View  09/19/2011  *RADIOLOGY REPORT*  Clinical Data: Stroke, cough  CHEST - 2 VIEW  Comparison: Chest radiograph 09/19/2011  Findings: Heart silhouette is enlarged.  There are low lung volumes.  There is mild interstitial edema pattern increased from prior.  No pneumothorax.  No overt pulmonary edema.  No pleural fluid evident.  IMPRESSION:  1.  Mild increase in  interstitial edema pattern. 2.  Cardiomegaly and low  lung volumes.  Original Report Authenticated By: Genevive Bi, M.D.   Dg Chest 2 View  09/19/2011  *RADIOLOGY REPORT*  Clinical Data: Altered mental status  CHEST - 2 VIEW  Comparison: 09/05/2011  Findings: Cardiomediastinal silhouette is stable.  Elevation of the left hemidiaphragm again noted.  Bilateral basilar atelectasis. Stable degenerative changes thoracic spine.  No pulmonary edema. Stable atelectasis or scarring right midlung laterally.  IMPRESSION: Elevation of the left hemidiaphragm.  Bilateral basilar atelectasis.  No focal infiltrate or pulmonary edema.  Original Report Authenticated By: Natasha Mead, M.D.   Ct Head Wo Contrast  09/19/2011  *RADIOLOGY REPORT*  Clinical Data: Confusion, mental status change  CT HEAD WITHOUT CONTRAST  Technique:  Contiguous axial images were obtained from the base of the skull through the vertex without contrast.  Comparison: CT sinus 01/18/2009 which includes a significant amount of information of the brain.  Findings: Hypodensity left parietal cortex compatible with acute infarct.  This was not present on the prior CT.  No associated hemorrhage.  Moderate to advanced atrophy.  Chronic microvascular ischemic changes throughout the white matter.  Negative for hemorrhage.  Heavy calcification of the carotid vertebral arteries.  No acute abnormality of the skull  IMPRESSION: Left parietal hypodensity, compatible with acute infarct.  Extensive atrophy and  chronic microvascular ischemic change.  No acute hemorrhage.  Original Report Authenticated By: Camelia Phenes, M.D.   Ct Abdomen W Contrast  09/19/2011  *RADIOLOGY REPORT*  Clinical Data:  Abdominal wall cellulitis.  Hepatitis C.  End-stage renal disease.  CT ABDOMEN WITH CONTRAST  Technique:  Multidetector CT imaging of the abdomen was performed using the standard protocol following bolus administration of intravenous contrast.  Contrast: 80mL OMNIPAQUE IOHEXOL 300 MG/ML  SOLN  Comparison:  03/02/2006.  Findings:   Left base atelectasis.  Artifact of the anterior abdominal wall caused by the patient's habitus.  This limits detection of subtle subcutaneous inflammatory process. At the L5 level in a right paracentral position is an anterior abdominal wall 2 cm collection which may represent scar or inflammation.  This cannot be confirmed as a drainable abscess on the present exam.  Cardiomegaly.  Coronary artery calcifications.  Atherosclerotic type changes of the lower thoracic and abdominal aorta with ectasia without aneurysmal dilation.  Atherosclerotic type changes of the aorta branch vessels.  Fatty infiltration of the liver without focal hepatic lesion.  No calcified gallstones.  Small calcification within the spleen without focal splenic lesion otherwise noted.  No focal pancreatic lesion.  Atrophic kidneys with bilateral renal cysts.  Some smaller cysts are difficult to confirm as a simple cysts.  No focal adrenal mass.  Fluid filled right colon.  No extraluminal bowel inflammatory process, free fluid or free air.  Under distended stomach limits evaluation.  Gastric fold appears slightly thickened which may be related to the under distension.  Gastritis difficult to exclude.  Degenerative changes lower thoracic lumbar spine with various degrees of Schmorl's node deformities.  IMPRESSION: Artifact of the anterior abdominal wall caused by the patient's habitus.  This limits detection of subtle subcutaneous inflammatory process. At the L5 level in a right paracentral position is an anterior abdominal wall 2 cm collection which may represent scar or inflammation.  This cannot be confirmed as a drainable abscess on the present exam.  Please see above.  Original Report Authenticated By: Fuller Canada, M.D.   Medications:      . amLODipine  5 mg Oral Daily  . ampicillin-sulbactam (UNASYN) IV  3 g Intravenous Q24H  . aspirin  300 mg Rectal Daily   Or  . aspirin  325 mg Oral Daily  . calcium carbonate  1 tablet Oral  TID WC  . enoxaparin  40 mg Subcutaneous Q24H  . insulin aspart  0-5 Units Subcutaneous QHS  . insulin aspart  0-9 Units Subcutaneous TID WC  . metoprolol tartrate  25 mg Oral BID  . multivitamin  1 tablet Oral QHS  . paricalcitol  7 mcg Intravenous 3 times weekly  . PARoxetine  10 mg Oral Daily    I  have reviewed scheduled and prn medications.  Physical Exam:  Blood pressure 153/77, pulse 70, temperature 99.1 F (37.3 C), temperature source Oral, resp. rate 20, height 5' (1.524 m), weight 101.101 kg (222 lb 14.2 oz), SpO2 94.00%.  General appearance: alert, cooperative and no distress     Neck: no JVD  Resp: clear to auscultation bilaterally  Cardio: regular, no rub GI: + BS, soft, nontender, but with ecchymosis surrounding PD catheter site on right, no drainage at exit site, some induration and hematoma post-op, minimally tender  Extremities: extremities normal, atraumatic, no cyanosis or edema  Neurologic: Grossly normal, alert and oriented, but with difficulty speaking and mild R sided weakness Dialysis Access: AVG @ LFA  with + bruit   Dialysis Orders: Center: [facility] on MWF .  EDW 103 kg HD Bath 2K/2.25Ca Time 4 hrs Heparin 9600 U. Access AVG @ LFA BFR 450 DFR 800  Zemplar 7 mcg IV/HD Epogen 0 Units IV/HD Venofer 0   Assessment/Plan  1. 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 ESRD / MWF - HD today, he is below dry weight, no vol excess on exam.  3. Recent PD cath placement- cell count high 6000 range. Not sure what this represents since patient is not on PD yet. Will repeat cell count after a 1 liter dwell.  Will flush daily with 1 liter of 1.5% while in the hospital, then resume once weekly.  Surg does not think exit site is infected, just postop changes.   4. Hypertension/volume - op meds are Norvasc 5 mg qhs and Metoprolol 25 mg bid; chest x-ray shows bilateral basilar atelectasis without edema or infiltrate, usually fluid gains 2-3 L.  5. Anemia - Hgb  10.5 off Epogen, no IV Fe 6. Metabolic bone disease - Ca 9.1, last P 7 on 3/20; on Zemplar 7 mcg per HD, Tums 500 3ac tid as at home 7. Nutrition - last Alb 4 on 3/20.  8. DM - on Insulin.  9. PVD - on Plavix as outpatient.  Vinson Moselle  MD [employer] [phone] pgr    [phone] cell 09/20/2011, 10:33 AM

## 2011-09-20 NOTE — Progress Notes (Signed)
  Echocardiogram 2D Echocardiogram has been performed.  Emelia Loron A 09/20/2011, 9:15 AM

## 2011-09-20 NOTE — Progress Notes (Signed)
Stroke Team Progress Note  HISTORY Sean [name] is an 76 y.o. male with a history of 2 previous small strokes as well as hypertension, diabetes mellitus and end-stage renal disease on dialysis presenting with onset of speech difficulty starting 09/18/2011 following his dialysis session and demonstrating worsening 09/19/2011. He has demonstrated word finding difficulty as well as apparent difficulty understanding what is being said to him at times. CT scan of his head showed findings consistent with acute left parietal stroke. Patient has not been on antiplatelet therapy. NIH stroke score was 3.  Patient was not a TPA candidate secondary to delay in arrival. He was admitted for further evaluation and treatment.  SUBJECTIVE His is sitting at the bedside. Overall he feels the condition is stable. No complaints.  OBJECTIVE Most recent Vital Signs: Filed Vitals:   09/20/11 0200 09/20/11 0400 09/20/11 0647 09/20/11 1000  BP: 146/75 164/74 145/70 153/77  Pulse: 72 69 74 70  Temp: 99 F (37.2 C) 98.9 F (37.2 C) 98.6 F (37 C) 99.1 F (37.3 C)  TempSrc: Oral Oral Oral Oral  Resp: 20 20 22 20   Height:      Weight:      SpO2: 96% 96% 92% 94%   CBG (last 3)   Basename 09/20/11 0644 09/19/11 2122 09/19/11 1804  GLUCAP 90 110* 87   Intake/Output from previous day:   IV Fluid Intake:     MEDICATIONS    . amLODipine  5 mg Oral Daily  . ampicillin-sulbactam (UNASYN) IV  3 g Intravenous Q24H  . aspirin  300 mg Rectal Daily   Or  . aspirin  325 mg Oral Daily  . calcium carbonate  1 tablet Oral TID WC  . enoxaparin  40 mg Subcutaneous Q24H  . insulin aspart  0-5 Units Subcutaneous QHS  . insulin aspart  0-9 Units Subcutaneous TID WC  . metoprolol tartrate  25 mg Oral BID  . multivitamin  1 tablet Oral QHS  . paricalcitol  7 mcg Intravenous 3 times weekly  . PARoxetine  10 mg Oral Daily   PRN:  acetaminophen, acetaminophen, heparin, iohexol, ondansetron (ZOFRAN) IV, oxyCODONE,  senna-docusate  Diet:  Renal thin liquids Activity:  As tolerated DVT Prophylaxis:  Lovenox 40 mg sq daily   CLINICALLY SIGNIFICANT STUDIES CBC    Component Value Date/Time   WBC 1.7* 09/20/2011 0822   RBC 3.84* 09/20/2011 0822   HGB 10.7* 09/20/2011 0822   HCT 32.4* 09/20/2011 0822   PLT 160 09/20/2011 0822   MCV 84.4 09/20/2011 0822   MCH 27.9 09/20/2011 0822   MCHC 33.0 09/20/2011 0822   RDW 13.9 09/20/2011 0822   LYMPHSABS 0.7 09/19/2011 1012   MONOABS 0.1 09/19/2011 1012   EOSABS 0.0 09/19/2011 1012   BASOSABS 0.0 09/19/2011 1012   CMP    Component Value Date/Time   NA 135 09/20/2011 0822   K 4.1 09/20/2011 0822   CL 94* 09/20/2011 0822   CO2 28 09/20/2011 0822   GLUCOSE 135* 09/20/2011 0822   BUN 38* 09/20/2011 0822   CREATININE 9.07* 09/20/2011 0822   CALCIUM 9.3 09/20/2011 0822   PROT 6.6 06/29/2009 0438   ALBUMIN 2.9* 09/20/2011 0822   AST 12 06/29/2009 0438   ALT 13 06/29/2009 0438   ALKPHOS 62 06/29/2009 0438   BILITOT 0.8 06/29/2009 0438   GFRNONAA 5* 09/20/2011 0822   GFRAA 6* 09/20/2011 0822   COAGS Lab Results  Component Value Date   INR 1.07 06/27/2009   Lipid  Panel    Component Value Date/Time   CHOL 161 09/20/2011 0822   TRIG 121 09/20/2011 0822   HDL 34* 09/20/2011 0822   CHOLHDL 4.7 09/20/2011 0822   VLDL 24 09/20/2011 0822   LDLCALC 103* 09/20/2011 0822   HgbA1C  No results found for this basename: HGBA1C   Cardiac Panel (last 3 results) No results found for this basename: CKTOTAL:3,CKMB:3,TROPONINI:3,RELINDX:3 in the last 72 hours Urinalysis No results found for this basename: colorurine, appearanceur, labspec, phurine, glucoseu, hgbur, bilirubinur, ketonesur, proteinur, urobilinogen, nitrite, leukocytesur   Urine Drug Screen  No results found for this basename: labopia, cocainscrnur, labbenz, amphetmu, thcu, labbarb    Alcohol Level No results found for this basename: eth   Ct Abdomen W Contrast  09/19/2011   Artifact of the anterior abdominal wall caused by the  patient's habitus.  This limits detection of subtle subcutaneous inflammatory process. At the L5 level in a right paracentral position is an anterior abdominal wall 2 cm collection which may represent scar or inflammation.  This cannot be confirmed as a drainable abscess on the present exam.     CT of the brain  Left parietal hypodensity, compatible with acute infarct.  Extensive atrophy and chronic microvascular ischemic change.  No acute hemorrhage.    MRI of the brain    MRA of the brain    2D Echocardiogram    Carotid Doppler    CXR   09/19/2011    1.  Mild increase in  interstitial edema pattern. 2.  Cardiomegaly and low lung volumes 09/19/2011  Elevation of the left hemidiaphragm.  Bilateral basilar atelectasis.  No focal infiltrate or pulmonary edema  EKG     Physical Exam   Pleasant middle-aged [place] American male currently not in distress.Awake alert. Afebrile. Head is nontraumatic. Neck is supple without bruit. Hearing is normal. Cardiac exam no murmur or gallop. Lungs are clear to auscultation. Distal pulses are well felt.  Neurological exam : awake alert oriented to time and place. Diminished short-term memory registration and recall. Speech is slow and hesitant with some word finding difficulties but no paraphasic errors. He has diminished animal naming and repetition. He is able to name. Extraocular movements are full regular his driver's. He has a right partial homonymous hemianopsia. Tongue is midline. Motor system exam reveals no upper or lower expected risk. Diminished fine finger movements on the right he orbits left over right upper extremity. There is minimal weakness in the right grip. He moves both lower extremities well against gravity. Gait was not tested. ASSESSMENT [name] is a 76 y.o. male with a left parietal infarct secondary to unknown etiology, workup pending. On no antiplatelets prior to admission. Now on aspirin 325 mg orally every day for secondary  stroke prevention.   -ESRD on HD -hyperlipidemia -hypertension -Stroke -headache -Diabetes -Peripheral neuropathy  Hospital day # 1  TREATMENT/PLAN -Continue aspirin 325 mg orally every day for secondary stroke prevention. -complete stroke workup. Will follow up tomorrow.  Joaquin Music, ANP-BC, GNP-BC [facility] Pager: [phone] 09/20/2011 10:47 AM  Dr. Delia Heady, Stroke Center Medical Director, has personally reviewed chart, pertinent data, examined the patient and developed the plan of care.

## 2011-09-20 NOTE — Progress Notes (Signed)
Utilization review completed.  

## 2011-09-20 NOTE — Progress Notes (Signed)
OT Cancellation Note  Treatment cancelled today due to patient leaving for dialysis session.  Will re-attempt tomorrow. Thanks.  09/20/2011 Cipriano Mile OTR/L Pager [phone] Office [phone]

## 2011-09-20 NOTE — Progress Notes (Signed)
Drained PD cath for 40 minutes. 600 cc redish/orange cloudy fluid with fibrin drained. Fluid sent for cell count. Steele Berg RN

## 2011-09-20 NOTE — Evaluation (Signed)
Physical Therapy Evaluation Patient Details Name: Sean [name] MRN: [id number] DOB: 1935/12/17 Today's Date: 09/20/2011  Problem List:  Patient Active Problem List  Diagnoses  . HYPERLIPIDEMIA  . ANXIETY  . HYPERTENSION  . MI  . End stage renal disease  . HEADACHE  . COUGH  . CEREBROVASCULAR ACCIDENT, HX OF  . CVA (cerebral infarction)  . Diabetes mellitus due to underlying condition    Past Medical History:  Past Medical History  Diagnosis Date  . Hyperparathyroidism   . Arthritis   . Hyperlipidemia   . Hypertension   . Chronic kidney disease   . Neuromuscular disorder   . Stroke   . GERD (gastroesophageal reflux disease)   . Headache     occasional  migraine   . Heart attack 2011; 1990's  . Type II diabetes mellitus   . Peripheral neuropathy   . Anxiety   . Depression   . Dialysis patient     M, W, F; [address] (09/19/11)   Past Surgical History:  Past Surgical History  Procedure Date  . Colon surgery 2011    colon resection   . Parathyroidectomy 2011  . Capd insertion 09/12/2011    Procedure: LAPAROSCOPIC INSERTION CONTINUOUS AMBULATORY PERITONEAL DIALYSIS  (CAPD) CATHETER;  Surgeon: Mariella Saa, MD;  Location: [facility] OR;  Service: General;  Laterality: N/A;  . Sp av dialysis shunt access existing *l* 2003  . Dg av dialysis shunt intro ndl*r* or     "not working" (09/19/11)    PT Assessment/Plan/Recommendation PT Assessment Clinical Impression Statement: Pt 76 y.o. s/p L parietal CVA with resulting impaired cognition, decreased safety awareness, decreased mobility, impaired balance, and impaired gait.  Pt will benefit from skilled physical therapy in the acute setting in order to address these impairments. PT Recommendation/Assessment: Patient will need skilled PT in the acute care venue PT Problem List: Decreased activity tolerance;Decreased balance;Decreased cognition;Decreased safety awareness;Impaired sensation Barriers to Discharge:  Decreased caregiver support PT Therapy Diagnosis : Difficulty walking;Abnormality of gait PT Plan PT Frequency: Min 3X/week PT Treatment/Interventions: Gait training;Stair training;Functional mobility training;Therapeutic activities;Balance training;Neuromuscular re-education;Patient/family education PT Recommendation Follow Up Recommendations: Supervision/Assistance - 24 hour;Home health PT (Pt must have 24 supervision, if not patient will need SNF.) Equipment Recommended: Defer to next venue PT Goals  Acute Rehab PT Goals PT Goal Formulation: With patient Time For Goal Achievement: 7 days Pt will go Supine/Side to Sit: with rail;Independently PT Goal: Supine/Side to Sit - Progress: Goal set today Pt will go Sit to Supine/Side: Independently;with rail PT Goal: Sit to Supine/Side - Progress: Goal set today Pt will go Sit to Stand: with modified independence;with upper extremity assist PT Goal: Sit to Stand - Progress: Goal set today Pt will go Stand to Sit: with modified independence;with upper extremity assist PT Goal: Stand to Sit - Progress: Goal set today Pt will Ambulate: >150 feet;with supervision;with least restrictive assistive device;with gait velocity >(comment) ft/second (with gait velocity > 1.63ft/sec) PT Goal: Ambulate - Progress: Goal set today Pt will Go Up / Down Stairs: 1-2 stairs;with supervision;with rail(s) PT Goal: Up/Down Stairs - Progress: Goal set today Additional Goals Additional Goal #1: Pt will increase his DGI score by 5 points in order to demonstrate decreased fall risk. PT Goal: Additional Goal #1 - Progress: Goal set today  PT Evaluation Precautions/Restrictions  Precautions Precautions: Fall Restrictions Weight Bearing Restrictions: No Prior Functioning  Home Living Lives With: Alone Receives Help From: Family (Three daughters, Pt states they live close by)  Type of Home: Apartment Home Layout: One level Home Access: Elevator Bathroom  Shower/Tub: Tub/shower unit;Curtain Bathroom Toilet: Standard Bathroom Accessibility: Yes How Accessible: Accessible via walker Home Adaptive Equipment: Straight cane;Other (comment) (Rollator) Prior Function Level of Independence: Requires assistive device for independence (Pt uses rollator) Driving: Yes Cognition Cognition Arousal/Alertness: Lethargic Overall Cognitive Status: Impaired Orientation Level: Oriented to person;Oriented to place;Oriented to situation;Disoriented to time Sensation/Coordination Sensation Light Touch: Impaired by gross assessment (Pt with baseline numbness of bilateral fingertips) Coordination Gross Motor Movements are Fluid and Coordinated: Yes Extremity Assessment RLE Assessment RLE Assessment: Within Functional Limits LLE Assessment LLE Assessment: Within Functional Limits Mobility (including Balance) Bed Mobility Bed Mobility: Yes Supine to Sit: 5: Supervision;With rails Supine to Sit Details (indicate cue type and reason): Pt required supervision for safety. Sitting - Scoot to Edge of Bed: 6: Modified independent (Device/Increase time) Transfers Transfers: Yes Sit to Stand: Other (comment);From bed;With upper extremity assist (Min guard) Sit to Stand Details (indicate cue type and reason): Pt required min guard to initiate transfer and for safety with VC for hand placement. Stand to Sit: Other (comment);To chair/3-in-1;With upper extremity assist;With armrests (Min guard) Stand to Sit Details: Pt requierd min guard to control descent into chair. Ambulation/Gait Ambulation/Gait: Yes Ambulation/Gait Assistance: 4: Min assist Ambulation/Gait Assistance Details (indicate cue type and reason): Pt required min assist for safety. Ambulation Distance (Feet): 250 Feet Assistive device: Rollator Gait Pattern: Step-through pattern;Decreased stride length Gait velocity: Decreased; 40ft/30sec=0.8ft/sec Stairs: Yes Stairs Assistance: Other (comment) (Min  guard) Stair Management Technique: Two rails;Alternating pattern;Forwards Number of Stairs: 2   Posture/Postural Control Posture/Postural Control: No significant limitations Balance Balance Assessed: Yes Static Sitting Balance Static Sitting - Balance Support: Bilateral upper extremity supported;Feet supported Static Sitting - Level of Assistance: 5: Stand by assistance Static Sitting - Comment/# of Minutes: ~5 minutes Dynamic Gait Index Level Surface: Mild Impairment Change in Gait Speed: Mild Impairment Gait with Horizontal Head Turns: Mild Impairment Gait with Vertical Head Turns: Mild Impairment Gait and Pivot Turn: Moderate Impairment Step Over Obstacle: Moderate Impairment Step Around Obstacles: Mild Impairment Steps: Mild Impairment Total Score: 14  Exercise    End of Session PT - End of Session Equipment Utilized During Treatment: Gait belt Activity Tolerance: Patient tolerated treatment well;Treatment limited secondary to agitation (Pt agitated towards end of treatment session) Patient left: in chair;with call bell in reach;with family/visitor present General Behavior During Session: [facility] for tasks performed (Agitated towards end of session) Cognition: Impaired Cognitive Impairment: Pt disoriented to time, however aware of limitation.  Pt became agitated towards the end of the session when he began to tire.  Pt unable to answer certain history questions, some of the history given by his daughter.  Ezzard Standing SPT 09/20/2011, 3:33 PM

## 2011-09-20 NOTE — Progress Notes (Signed)
   CARE MANAGEMENT NOTE 09/20/2011  Patient:  Sean [name], Sean [name]   Account Number:  [id number]  Date Initiated:  09/20/2011  Documentation initiated by:  Letha Cape  Subjective/Objective Assessment:   dx cva  admit-lives alone     Action/Plan:   pt/ot/st eval- rec hhpt with 24 hr supervison , if not supervison then needs snf.   Anticipated DC Date:  09/23/2011   Anticipated DC Plan:  HOME W HOME HEALTH SERVICES      DC Planning Services  CM consult      Choice offered to / List presented to:             Status of service:  In process, will continue to follow Medicare Important Message given?   (If response is "NO", the following Medicare IM given date fields will be blank) Date Medicare IM given:   Date Additional Medicare IM given:    Discharge Disposition:    Per UR Regulation:    If discussed at Long Length of Stay Meetings, dates discussed:    Comments:  09/20/11 16:59 Letha Cape RN, BSN [phone] patient lives alone, await pt/ot eval.

## 2011-09-20 NOTE — Progress Notes (Signed)
Subjective: No complains.  Objective: Filed Vitals:   09/20/11 0200 09/20/11 0400 09/20/11 0647 09/20/11 1000  BP: 146/75 164/74 145/70 153/77  Pulse: 72 69 74 70  Temp: 99 F (37.2 C) 98.9 F (37.2 C) 98.6 F (37 C) 99.1 F (37.3 C)  TempSrc: Oral Oral Oral Oral  Resp: 20 20 22 20   Height:      Weight:      SpO2: 96% 96% 92% 94%   Weight change:   Intake/Output Summary (Last 24 hours) at 09/20/11 1430 Last data filed at 09/20/11 0830  Gross per 24 hour  Intake    240 ml  Output      0 ml  Net    240 ml    General: Alert, awake, oriented x3, in no acute distress.  HEENT: No bruits, no goiter.  Heart: Regular rate and rhythm, without murmurs, rubs, gallops.  Lungs: good air movement, CTA b/l. Abdomen: Soft, nontender, nondistended, positive bowel sounds.  Neuro: Grossly intact, nonfocal.   Lab Results:  Basename 09/20/11 0822 09/19/11 1356 09/19/11 1012  NA 135 137 133*  K 4.1 3.8 3.6  CL 94* 94* 92*  CO2 28 28 28   GLUCOSE 135* 82 90  BUN 38* 28* 28*  CREATININE 9.07* 7.33* 7.42*  CALCIUM 9.3 9.1 9.2  MG -- -- --  PHOS 4.2 -- --    Basename 09/20/11 0822  AST --  ALT --  ALKPHOS --  BILITOT --  PROT --  ALBUMIN 2.9*   No results found for this basename: LIPASE:2,AMYLASE:2 in the last 72 hours  Basename 09/20/11 0822 09/19/11 1012  WBC 1.7* 2.2*  NEUTROABS -- 1.5*  HGB 10.7* 10.5*  HCT 32.4* 31.4*  MCV 84.4 85.1  PLT 160 157   No results found for this basename: CKTOTAL:3,CKMB:3,CKMBINDEX:3,TROPONINI:3 in the last 72 hours No components found with this basename: POCBNP:3 No results found for this basename: DDIMER:2 in the last 72 hours No results found for this basename: HGBA1C:2 in the last 72 hours  Basename 09/20/11 0822  CHOL 161  HDL 34*  LDLCALC 103*  TRIG 121  CHOLHDL 4.7  LDLDIRECT --   No results found for this basename: TSH,T4TOTAL,FREET3,T3FREE,THYROIDAB in the last 72 hours  Basename 09/19/11 1356  VITAMINB12 --  FOLATE  --  FERRITIN --  TIBC --  IRON --  RETICCTPCT 0.5    Micro Results: Recent Results (from the past 240 hour(s))  GRAM STAIN     Status: Normal   Collection Time   09/19/11 11:47 AM      Component Value Range Status Comment   Specimen Description PERITONEAL CAVITY   Final    Special Requests NONE   Final    Gram Stain     Final    Value: ABUNDANT WBC PRESENT,BOTH PMN AND MONONUCLEAR     NO ORGANISMS SEEN   Report Status 09/19/2011 FINAL   Final     Studies/Results: Dg Chest 2 View  09/19/2011  *RADIOLOGY REPORT*  Clinical Data: Stroke, cough  CHEST - 2 VIEW  Comparison: Chest radiograph 09/19/2011  Findings: Heart silhouette is enlarged.  There are low lung volumes.  There is mild interstitial edema pattern increased from prior.  No pneumothorax.  No overt pulmonary edema.  No pleural fluid evident.  IMPRESSION:  1.  Mild increase in  interstitial edema pattern. 2.  Cardiomegaly and low lung volumes.  Original Report Authenticated By: Genevive Bi, M.D.   Dg Chest 2 View  09/19/2011  *  RADIOLOGY REPORT*  Clinical Data: Altered mental status  CHEST - 2 VIEW  Comparison: 09/05/2011  Findings: Cardiomediastinal silhouette is stable.  Elevation of the left hemidiaphragm again noted.  Bilateral basilar atelectasis. Stable degenerative changes thoracic spine.  No pulmonary edema. Stable atelectasis or scarring right midlung laterally.  IMPRESSION: Elevation of the left hemidiaphragm.  Bilateral basilar atelectasis.  No focal infiltrate or pulmonary edema.  Original Report Authenticated By: Natasha Mead, M.D.   Ct Head Wo Contrast  09/19/2011  *RADIOLOGY REPORT*  Clinical Data: Confusion, mental status change  CT HEAD WITHOUT CONTRAST  Technique:  Contiguous axial images were obtained from the base of the skull through the vertex without contrast.  Comparison: CT sinus 01/18/2009 which includes a significant amount of information of the brain.  Findings: Hypodensity left parietal cortex compatible with  acute infarct.  This was not present on the prior CT.  No associated hemorrhage.  Moderate to advanced atrophy.  Chronic microvascular ischemic changes throughout the white matter.  Negative for hemorrhage.  Heavy calcification of the carotid vertebral arteries.  No acute abnormality of the skull  IMPRESSION: Left parietal hypodensity, compatible with acute infarct.  Extensive atrophy and chronic microvascular ischemic change.  No acute hemorrhage.  Original Report Authenticated By: Camelia Phenes, M.D.   Ct Abdomen W Contrast  09/19/2011  *RADIOLOGY REPORT*  Clinical Data:  Abdominal wall cellulitis.  Hepatitis C.  End-stage renal disease.  CT ABDOMEN WITH CONTRAST  Technique:  Multidetector CT imaging of the abdomen was performed using the standard protocol following bolus administration of intravenous contrast.  Contrast: 80mL OMNIPAQUE IOHEXOL 300 MG/ML  SOLN  Comparison:  03/02/2006.  Findings:  Left base atelectasis.  Artifact of the anterior abdominal wall caused by the patient's habitus.  This limits detection of subtle subcutaneous inflammatory process. At the L5 level in a right paracentral position is an anterior abdominal wall 2 cm collection which may represent scar or inflammation.  This cannot be confirmed as a drainable abscess on the present exam.  Cardiomegaly.  Coronary artery calcifications.  Atherosclerotic type changes of the lower thoracic and abdominal aorta with ectasia without aneurysmal dilation.  Atherosclerotic type changes of the aorta branch vessels.  Fatty infiltration of the liver without focal hepatic lesion.  No calcified gallstones.  Small calcification within the spleen without focal splenic lesion otherwise noted.  No focal pancreatic lesion.  Atrophic kidneys with bilateral renal cysts.  Some smaller cysts are difficult to confirm as a simple cysts.  No focal adrenal mass.  Fluid filled right colon.  No extraluminal bowel inflammatory process, free fluid or free air.  Under  distended stomach limits evaluation.  Gastric fold appears slightly thickened which may be related to the under distension.  Gastritis difficult to exclude.  Degenerative changes lower thoracic lumbar spine with various degrees of Schmorl's node deformities.  IMPRESSION: Artifact of the anterior abdominal wall caused by the patient's habitus.  This limits detection of subtle subcutaneous inflammatory process. At the L5 level in a right paracentral position is an anterior abdominal wall 2 cm collection which may represent scar or inflammation.  This cannot be confirmed as a drainable abscess on the present exam.  Please see above.  Original Report Authenticated By: Fuller Canada, M.D.  ECHO: Study Conclusions - Left ventricle: The cavity size was normal. Systolic function was vigorous. The estimated ejection fraction was in the range of 65% to 70%. Wall motion was normal; there were no regional wall motion abnormalities. Doppler  arameters are consistent with abnormal left ventricular relaxation (grade 1 diastolic dysfunction). - Mitral valve: Moderately calcified annulus. Moderately calcified leaflets .    Medications: I have reviewed the patient's current medications.  Assessment and plan: Principal Problem:  *CVA (cerebral infarction) -Prophylactic therapy-Antiplatelet med: Aspirin - dose 325 mg PO daily  -fasting lipid panel showed LDL >100, HDL <40. Start statin -MRI, MRA of the brain pending.PT consult, OT consult, Speech consult pending. -Echocardiogram as above. -Carotid dopplers pending -Cardiac Monitoring, no events -Keep MAP 70 -tobacco counseling cessation.   HYPERLIPIDEMIA See CVA.   HYPERTENSION -Stable continue Norvasc, permissive HTN, goal <180/90.   End stage renal disease: -Per renal   Diabetes mellitus due to underlying condition -check a hbgA1c   LOS: 1 day   Marinda Elk M.D. Pager: [phone] [facility] 09/20/2011, 2:30 PM

## 2011-09-20 NOTE — Evaluation (Signed)
Agree with student PT evaluation.  Baylie Drakes, PT DPT [phone]  

## 2011-09-20 NOTE — Progress Notes (Signed)
Patient ID: Sean [name], male   DOB: Apr 08, 1936, 76 y.o.   MRN: [id number]    Subjective: Feels better. Has had only mild expected soreness around PD catheter  Objective: Vital signs in last 24 hours: Temp:  [98.6 F (37 C)-100.2 F (37.9 C)] 98.6 F (37 C) (04/10 0647) Pulse Rate:  [69-88] 74  (04/10 0647) Resp:  [18-22] 22  (04/10 0647) BP: (145-178)/(70-81) 145/70 mmHg (04/10 0647) SpO2:  [91 %-97 %] 92 % (04/10 0647) Weight:  [222 lb 14.2 oz (101.101 kg)] 222 lb 14.2 oz (101.101 kg) (04/09 1655) Last BM Date: 09/19/11  Intake/Output from previous day:   Intake/Output this shift:    GI: normal findings: soft, non-tender Incision/Wound:Exit site and incision look fine without evidence of infection, just small hematoma at incision  Lab Results:   Basename 09/20/11 0822 09/19/11 1012  WBC 1.7* 2.2*  HGB 10.7* 10.5*  HCT 32.4* 31.4*  PLT 160 157   BMET  Basename 09/20/11 0822 09/19/11 1356  NA 135 137  K 4.1 3.8  CL 94* 94*  CO2 28 28  GLUCOSE 135* 82  BUN 38* 28*  CREATININE 9.07* 7.33*  CALCIUM 9.3 9.1     Studies/Results: Dg Chest 2 View  09/19/2011  *RADIOLOGY REPORT*  Clinical Data: Stroke, cough  CHEST - 2 VIEW  Comparison: Chest radiograph 09/19/2011  Findings: Heart silhouette is enlarged.  There are low lung volumes.  There is mild interstitial edema pattern increased from prior.  No pneumothorax.  No overt pulmonary edema.  No pleural fluid evident.  IMPRESSION:  1.  Mild increase in  interstitial edema pattern. 2.  Cardiomegaly and low lung volumes.  Original Report Authenticated By: Genevive Bi, M.D.   Dg Chest 2 View  09/19/2011  *RADIOLOGY REPORT*  Clinical Data: Altered mental status  CHEST - 2 VIEW  Comparison: 09/05/2011  Findings: Cardiomediastinal silhouette is stable.  Elevation of the left hemidiaphragm again noted.  Bilateral basilar atelectasis. Stable degenerative changes thoracic spine.  No pulmonary edema. Stable atelectasis or  scarring right midlung laterally.  IMPRESSION: Elevation of the left hemidiaphragm.  Bilateral basilar atelectasis.  No focal infiltrate or pulmonary edema.  Original Report Authenticated By: Natasha Mead, M.D.   Ct Head Wo Contrast  09/19/2011  *RADIOLOGY REPORT*  Clinical Data: Confusion, mental status change  CT HEAD WITHOUT CONTRAST  Technique:  Contiguous axial images were obtained from the base of the skull through the vertex without contrast.  Comparison: CT sinus 01/18/2009 which includes a significant amount of information of the brain.  Findings: Hypodensity left parietal cortex compatible with acute infarct.  This was not present on the prior CT.  No associated hemorrhage.  Moderate to advanced atrophy.  Chronic microvascular ischemic changes throughout the white matter.  Negative for hemorrhage.  Heavy calcification of the carotid vertebral arteries.  No acute abnormality of the skull  IMPRESSION: Left parietal hypodensity, compatible with acute infarct.  Extensive atrophy and chronic microvascular ischemic change.  No acute hemorrhage.  Original Report Authenticated By: Camelia Phenes, M.D.   Ct Abdomen W Contrast  09/19/2011  *RADIOLOGY REPORT*  Clinical Data:  Abdominal wall cellulitis.  Hepatitis C.  End-stage renal disease.  CT ABDOMEN WITH CONTRAST  Technique:  Multidetector CT imaging of the abdomen was performed using the standard protocol following bolus administration of intravenous contrast.  Contrast: 80mL OMNIPAQUE IOHEXOL 300 MG/ML  SOLN  Comparison:  03/02/2006.  Findings:  Left base atelectasis.  Artifact of the anterior abdominal  wall caused by the patient's habitus.  This limits detection of subtle subcutaneous inflammatory process. At the L5 level in a right paracentral position is an anterior abdominal wall 2 cm collection which may represent scar or inflammation.  This cannot be confirmed as a drainable abscess on the present exam.  Cardiomegaly.  Coronary artery calcifications.   Atherosclerotic type changes of the lower thoracic and abdominal aorta with ectasia without aneurysmal dilation.  Atherosclerotic type changes of the aorta branch vessels.  Fatty infiltration of the liver without focal hepatic lesion.  No calcified gallstones.  Small calcification within the spleen without focal splenic lesion otherwise noted.  No focal pancreatic lesion.  Atrophic kidneys with bilateral renal cysts.  Some smaller cysts are difficult to confirm as a simple cysts.  No focal adrenal mass.  Fluid filled right colon.  No extraluminal bowel inflammatory process, free fluid or free air.  Under distended stomach limits evaluation.  Gastric fold appears slightly thickened which may be related to the under distension.  Gastritis difficult to exclude.  Degenerative changes lower thoracic lumbar spine with various degrees of Schmorl's node deformities.  IMPRESSION: Artifact of the anterior abdominal wall caused by the patient's habitus.  This limits detection of subtle subcutaneous inflammatory process. At the L5 level in a right paracentral position is an anterior abdominal wall 2 cm collection which may represent scar or inflammation.  This cannot be confirmed as a drainable abscess on the present exam.  Please see above.  Original Report Authenticated By: Fuller Canada, M.D.    Anti-infectives: Anti-infectives     Start     Dose/Rate Route Frequency Ordered Stop   09/19/11 1445   Ampicillin-Sulbactam (UNASYN) 3 g in sodium chloride 0.9 % 100 mL IVPB        3 g 100 mL/hr over 60 Minutes Intravenous Every 24 hours 09/19/11 1422            Assessment/Plan: I don't see any evidence of infection around his PD cath.  Has some increased WBC in peritoneal fluid but abdomen is non tender. Cx pending.  I doubt he has peritonitis but I would defer to renal. ? Start catheter flushes- per renal    LOS: 1 day    Carlin Mamone T 09/20/2011

## 2011-09-21 ENCOUNTER — Inpatient Hospital Stay (HOSPITAL_COMMUNITY): Payer: Medicare Other

## 2011-09-21 ENCOUNTER — Other Ambulatory Visit (HOSPITAL_COMMUNITY): Payer: Medicare Other

## 2011-09-21 LAB — GLUCOSE, CAPILLARY
Glucose-Capillary: 96 mg/dL (ref 70–99)
Glucose-Capillary: 115 mg/dL — ABNORMAL HIGH (ref 70–99)
Glucose-Capillary: 103 mg/dL — ABNORMAL HIGH (ref 70–99)

## 2011-09-21 LAB — BODY FLUID CELL COUNT WITH DIFFERENTIAL
Eos, Fluid: 2 %
Lymphs, Fluid: 31 %

## 2011-09-21 MED ORDER — LORAZEPAM 2 MG/ML IJ SOLN
0.5000 mg | Freq: Once | INTRAMUSCULAR | Status: AC
  Administered 2011-09-21: 0.5 mg via INTRAVENOUS
  Filled 2011-09-21 (×2): qty 1

## 2011-09-21 MED ORDER — HEPARIN SODIUM (PORCINE) 1000 UNIT/ML DIALYSIS
20.0000 [IU]/kg | INTRAMUSCULAR | Status: DC | PRN
  Administered 2011-09-22: 2100 [IU] via INTRAVENOUS_CENTRAL
  Filled 2011-09-21: qty 3

## 2011-09-21 MED ORDER — LORAZEPAM 2 MG/ML IJ SOLN
1.0000 mg | Freq: Once | INTRAMUSCULAR | Status: AC
  Administered 2011-09-21: 1 mg via INTRAVENOUS

## 2011-09-21 NOTE — Progress Notes (Signed)
VASCULAR LAB PRELIMINARY  PRELIMINARY  PRELIMINARY  PRELIMINARY  Carotid Dopplers completed.    Preliminary report: There is no ICA stenosis.  Vertebral artery flow is antegrade.  Sherren Kerns Rhome, 09/21/2011, 10:18 AM

## 2011-09-21 NOTE — Progress Notes (Signed)
Stroke Team Progress Note  HISTORY Sean [name] is an 76 y.o. male with a history of 2 previous small strokes as well as hypertension, diabetes mellitus and end-stage renal disease on dialysis presenting with onset of speech difficulty starting 09/18/2011 following his dialysis session and demonstrating worsening 09/19/2011. He has demonstrated word finding difficulty as well as apparent difficulty understanding what is being said to him at times. CT scan of his head showed findings consistent with acute left parietal stroke. Patient has not been on antiplatelet therapy. NIH stroke score was 3.  Patient was not a TPA candidate secondary to delay in arrival. He was admitted for further evaluation and treatment.  SUBJECTIVE Patient in doppler lab. No complaints.  OBJECTIVE Most recent Vital Signs: Filed Vitals:   09/20/11 1930 09/20/11 2013 09/21/11 0443 09/21/11 0944  BP: 169/81 158/109 164/70 167/72  Pulse: 72 76 74 93  Temp: 98.7 F (37.1 C) 99.2 F (37.3 C) 99.8 F (37.7 C) 97.2 F (36.2 C)  TempSrc: Oral Oral Oral Oral  Resp: 16 16 16 16   Height:  5' (1.524 m)    Weight: 103 kg (227 lb 1.2 oz) 102.8 kg (226 lb 10.1 oz)    SpO2:  94% 91% 95%   CBG (last 3)  Basename 09/21/11 0751 09/20/11 2055 09/20/11 1134  GLUCAP 96 106* 106*   Intake/Output from previous day: 04/10 0701 - 04/11 0700 In: 480 [P.O.:480] Out: 2618 [Stool:1] IV Fluid Intake:     MEDICATIONS    . amLODipine  5 mg Oral Daily  . ampicillin-sulbactam (UNASYN) IV  3 g Intravenous Q24H  . aspirin  300 mg Rectal Daily   Or  . aspirin  325 mg Oral Daily  . atorvastatin  20 mg Oral q1800  . calcium carbonate  1 tablet Oral TID WC  . dialysis solution 1.5% low-MG/low-CA   Intraperitoneal Once in dialysis  . enoxaparin  40 mg Subcutaneous Q24H  . insulin aspart  0-5 Units Subcutaneous QHS  . insulin aspart  0-9 Units Subcutaneous TID WC  . LORazepam  0.5 mg Intravenous Once  . metoprolol tartrate  25 mg Oral  BID  . multivitamin  1 tablet Oral QHS  . paricalcitol  7 mcg Intravenous 3 times weekly  . PARoxetine  10 mg Oral Daily  . DISCONTD: simvastatin  40 mg Oral q1800   PRN:  acetaminophen, acetaminophen, heparin, ondansetron (ZOFRAN) IV, oxyCODONE, senna-docusate, DISCONTD: heparin  Diet:  Renal thin liquids Activity:  As tolerated DVT Prophylaxis:  Lovenox 40 mg sq daily   CLINICALLY SIGNIFICANT STUDIES CBC    Component Value Date/Time   WBC 1.8* 09/20/2011 1500   RBC 3.50* 09/20/2011 1500   HGB 9.7* 09/20/2011 1500   HCT 29.2* 09/20/2011 1500   PLT 164 09/20/2011 1500   MCV 83.4 09/20/2011 1500   MCH 27.7 09/20/2011 1500   MCHC 33.2 09/20/2011 1500   RDW 13.8 09/20/2011 1500   LYMPHSABS 0.7 09/19/2011 1012   MONOABS 0.1 09/19/2011 1012   EOSABS 0.0 09/19/2011 1012   BASOSABS 0.0 09/19/2011 1012   CMP    Component Value Date/Time   NA 132* 09/20/2011 1500   K 3.7 09/20/2011 1500   CL 92* 09/20/2011 1500   CO2 28 09/20/2011 1500   GLUCOSE 156* 09/20/2011 1500   BUN 45* 09/20/2011 1500   CREATININE 9.29* 09/20/2011 1500   CALCIUM 9.6 09/20/2011 1500   PROT 6.6 06/29/2009 0438   ALBUMIN 2.7* 09/20/2011 1500   AST 12  06/29/2009 0438   ALT 13 06/29/2009 0438   ALKPHOS 62 06/29/2009 0438   BILITOT 0.8 06/29/2009 0438   GFRNONAA 5* 09/20/2011 1500   GFRAA 6* 09/20/2011 1500   COAGS Lab Results  Component Value Date   INR 1.07 06/27/2009   Lipid Panel    Component Value Date/Time   CHOL 161 09/20/2011 0822   TRIG 121 09/20/2011 0822   HDL 34* 09/20/2011 0822   CHOLHDL 4.7 09/20/2011 0822   VLDL 24 09/20/2011 0822   LDLCALC 103* 09/20/2011 0822   HgbA1C  Lab Results  Component Value Date   HGBA1C 6.5* 09/20/2011   Cardiac Panel (last 3 results) No results found for this basename: CKTOTAL:3,CKMB:3,TROPONINI:3,RELINDX:3 in the last 72 hours Urinalysis No results found for this basename: colorurine,  appearanceur,  labspec,  phurine,  glucoseu,  hgbur,  bilirubinur,  ketonesur,  proteinur,   urobilinogen,  nitrite,  leukocytesur   Urine Drug Screen  No results found for this basename: labopia,  cocainscrnur,  labbenz,  amphetmu,  thcu,  labbarb    Alcohol Level No results found for this basename: eth   Ct Abdomen W Contrast  09/19/2011   Artifact of the anterior abdominal wall caused by the patient's habitus.  This limits detection of subtle subcutaneous inflammatory process. At the L5 level in a right paracentral position is an anterior abdominal wall 2 cm collection which may represent scar or inflammation.  This cannot be confirmed as a drainable abscess on the present exam.     CT of the brain  Left parietal hypodensity, compatible with acute infarct.  Extensive atrophy and chronic microvascular ischemic change.  No acute hemorrhage.    MRI of the brain  ordered   MRA of the brain  ordered   2D Echocardiogram  EF 55-60% with no source of embolus.   Carotid Doppler  No internal carotid artery stenosis bilaterally. Vertebrals with antegrade flow bilaterally.   CXR   09/19/2011    1.  Mild increase in  interstitial edema pattern. 2.  Cardiomegaly and low lung volumes 09/19/2011  Elevation of the left hemidiaphragm.  Bilateral basilar atelectasis.  No focal infiltrate or pulmonary edema  EKG     Physical Exam   Pleasant middle-aged [place] American male currently not in distress.Awake alert. Afebrile. Head is nontraumatic. Neck is supple without bruit. Hearing is normal. Cardiac exam no murmur or gallop. Lungs are clear to auscultation. Distal pulses are well felt.  Neurological exam : awake alert oriented to time and place. Diminished short-term memory registration and recall. Speech is slow and hesitant with some word finding difficulties but no paraphasic errors. He has diminished animal naming and repetition. He is able to name. Extraocular movements are full regular his driver's. He has a right partial homonymous hemianopsia. Tongue is midline. Motor system exam reveals no  upper or lower expected risk. Diminished fine finger movements on the right he orbits left over right upper extremity. There is minimal weakness in the right grip. He moves both lower extremities well against gravity. Gait was not tested.  ASSESSMENT Sean [name] is a 76 y.o. male with a left parietal infarct secondary to unknown etiology, workup pending. On no antiplatelets prior to admission. Now on aspirin 325 mg orally every day for secondary stroke prevention.   -ESRD on HD -hyperlipidemia -hypertension -Stroke -headache -Diabetes -Peripheral neuropathy  Hospital day # 2  TREATMENT/PLAN -Continue aspirin 325 mg orally every day for secondary stroke prevention. -complete MRI, MRA.  Will follow up.  Joaquin Music, ANP-BC, GNP-BC [facility] Pager: [phone] 09/21/2011 10:38 AM  Dr. Delia Heady, Stroke Center Medical Director, has personally reviewed chart, pertinent data, examined the patient and developed the plan of care.

## 2011-09-21 NOTE — Progress Notes (Signed)
Subjective:  No current complaints, feeling better.  Objective: Vital signs in last 24 hours: Temp:  [97.2 F (36.2 C)-100 F (37.8 C)] 97.2 F (36.2 C) (04/11 0944) Pulse Rate:  [70-93] 93  (04/11 0944) Resp:  [11-17] 16  (04/11 0944) BP: (130-169)/(63-109) 167/72 mmHg (04/11 0944) SpO2:  [91 %-95 %] 95 % (04/11 0944) Weight:  [102.8 kg (226 lb 10.1 oz)-105.4 kg (232 lb 5.8 oz)] 102.8 kg (226 lb 10.1 oz) (04/10 2013) Weight change: 4.299 kg (9 lb 7.6 oz)  Intake/Output from previous day: 04/10 0701 - 04/11 0700 In: 480 [P.O.:480] Out: 2618 [Stool:1]   EXAM: General appearance:  Alert, in no apparent distress Resp:  CTA bilaterally Cardio:  RRR without murmur GI: + BS, soft and nontender, PD catheter on right Extremities:  No edema Access:  AVG @ LFA with + bruit  Lab Results:  Basename 09/20/11 1500 09/20/11 0822  WBC 1.8* 1.7*  HGB 9.7* 10.7*  HCT 29.2* 32.4*  PLT 164 160   BMET:  Basename 09/20/11 1500 09/20/11 0822  NA 132* 135  K 3.7 4.1  CL 92* 94*  CO2 28 28  GLUCOSE 156* 135*  BUN 45* 38*  CREATININE 9.29* 9.07*  CALCIUM 9.6 9.3  ALBUMIN 2.7* 2.9*   No results found for this basename: PTH:2 in the last 72 hours Iron Studies: No results found for this basename: IRON,TIBC,TRANSFERRIN,FERRITIN in the last 72 hours  Assessment/Plan: 1. Acute CVA with mild right hemiparesis, improving- per primary. 2. ESRD - on HD on MWF, K stable at 3.7 post-HD yesterday.  Next HD tomorrow. 3. Recent PD cath placement- cell count high 6000 on 1st sample, but repeat after a dwell showed cell count of 45 which is normal. No evidence of peritonitis clinically, no organisms on gram stain, but it appears no culture was ordered. I asked lab to see if they can do a culture on the PD fluid. Will do maintenance catheter flush daily with 1 liter of 1.5% while in the hospital, then resume once weekly at d/c. Antibiotics were stopped. Surg does not think exit site is infected either,  just postop changes.  4. Hypertension/volume - op meds are Norvasc 5 mg qhs and Metoprolol 25 mg bid; chest x-ray shows bilateral basilar atelectasis without edema or infiltrate, wt 102.8 kg post-HD yesterday with net UF of 2.6 L (EDW 103 kg). 5. Anemia - Hgb 9.7 off Epogen, no IV Fe.  Will give Aranesp tomorrow. 6. Metabolic bone disease - Ca 9.6 (10.8 corrected), P 4; on Zemplar 7 mcg per HD, Tums 500 3ac tid as at home 7. Nutrition - last Alb 2.7 on 3/20.  8. DM - on Insulin.  9. PVD - on Plavix as outpatient.     LOS: 2 days   Sean [name],Sean [name] 09/21/2011,10:11 AM  Patient seen and examined and agree with assessment and plan as above.  Vinson Moselle  MD [facility] [phone] pgr    [phone] cell 09/21/2011, 10:54 AM

## 2011-09-21 NOTE — Progress Notes (Signed)
Subjective: No complains.  Objective: Filed Vitals:   09/20/11 1930 09/20/11 2013 09/21/11 0443 09/21/11 0944  BP: 169/81 158/109 164/70 167/72  Pulse: 72 76 74 93  Temp: 98.7 F (37.1 C) 99.2 F (37.3 C) 99.8 F (37.7 C) 97.2 F (36.2 C)  TempSrc: Oral Oral Oral Oral  Resp: 16 16 16 16   Height:  5' (1.524 m)    Weight: 103 kg (227 lb 1.2 oz) 102.8 kg (226 lb 10.1 oz)    SpO2:  94% 91% 95%   Weight change: 4.299 kg (9 lb 7.6 oz)  Intake/Output Summary (Last 24 hours) at 09/21/11 0957 Last data filed at 09/21/11 8119  Gross per 24 hour  Intake    240 ml  Output   2618 ml  Net  -2378 ml    General: Alert, awake, oriented x3, in no acute distress.  HEENT: No bruits, no goiter.  Heart: Regular rate and rhythm, without murmurs, rubs, gallops.  Lungs: good air movement, CTA b/l. Abdomen: Soft, nontender, nondistended, positive bowel sounds.  Neuro: Grossly intact, nonfocal.   Lab Results:  Basename 09/20/11 1500 09/20/11 0822 09/19/11 1356 09/19/11 1012  NA 132* 135 137 133*  K 3.7 4.1 3.8 3.6  CL 92* 94* 94* 92*  CO2 28 28 28 28   GLUCOSE 156* 135* 82 90  BUN 45* 38* 28* 28*  CREATININE 9.29* 9.07* 7.33* 7.42*  CALCIUM 9.6 9.3 9.1 9.2  MG -- -- -- --  PHOS 4.0 4.2 -- --    Basename 09/20/11 1500 09/20/11 0822  AST -- --  ALT -- --  ALKPHOS -- --  BILITOT -- --  PROT -- --  ALBUMIN 2.7* 2.9*   No results found for this basename: LIPASE:2,AMYLASE:2 in the last 72 hours  Basename 09/20/11 1500 09/20/11 0822 09/19/11 1012  WBC 1.8* 1.7* --  NEUTROABS -- -- 1.5*  HGB 9.7* 10.7* --  HCT 29.2* 32.4* --  MCV 83.4 84.4 --  PLT 164 160 --   No results found for this basename: CKTOTAL:3,CKMB:3,CKMBINDEX:3,TROPONINI:3 in the last 72 hours No components found with this basename: POCBNP:3 No results found for this basename: DDIMER:2 in the last 72 hours  Basename 09/20/11 1500 09/20/11 0822  HGBA1C 6.5* 6.0*    Basename 09/20/11 0822  CHOL 161  HDL 34*    LDLCALC 103*  TRIG 121  CHOLHDL 4.7  LDLDIRECT --   No results found for this basename: TSH,T4TOTAL,FREET3,T3FREE,THYROIDAB in the last 72 hours  Basename 09/19/11 1356  VITAMINB12 --  FOLATE --  FERRITIN --  TIBC --  IRON --  RETICCTPCT 0.5    Micro Results: Recent Results (from the past 240 hour(s))  GRAM STAIN     Status: Normal   Collection Time   09/19/11 11:47 AM      Component Value Range Status Comment   Specimen Description PERITONEAL CAVITY   Final    Special Requests NONE   Final    Gram Stain     Final    Value: ABUNDANT WBC PRESENT,BOTH PMN AND MONONUCLEAR     NO ORGANISMS SEEN   Report Status 09/19/2011 FINAL   Final   CULTURE, BLOOD (ROUTINE X 2)     Status: Normal (Preliminary result)   Collection Time   09/19/11  1:25 PM      Component Value Range Status Comment   Specimen Description BLOOD RIGHT ARM   Final    Special Requests BOTTLES DRAWN AEROBIC AND ANAEROBIC 10CC   Final  Culture  Setup Time [id number]   Final    Culture     Final    Value:        BLOOD CULTURE RECEIVED NO GROWTH TO DATE CULTURE WILL BE HELD FOR 5 DAYS BEFORE ISSUING A FINAL NEGATIVE REPORT   Report Status PENDING   Incomplete     Studies/Results: Dg Chest 2 View  09/19/2011  *RADIOLOGY REPORT*  Clinical Data: Stroke, cough  CHEST - 2 VIEW  Comparison: Chest radiograph 09/19/2011  Findings: Heart silhouette is enlarged.  There are low lung volumes.  There is mild interstitial edema pattern increased from prior.  No pneumothorax.  No overt pulmonary edema.  No pleural fluid evident.  IMPRESSION:  1.  Mild increase in  interstitial edema pattern. 2.  Cardiomegaly and low lung volumes.  Original Report Authenticated By: Genevive Bi, M.D.   Dg Chest 2 View  09/19/2011  *RADIOLOGY REPORT*  Clinical Data: Altered mental status  CHEST - 2 VIEW  Comparison: 09/05/2011  Findings: Cardiomediastinal silhouette is stable.  Elevation of the left hemidiaphragm again noted.  Bilateral basilar  atelectasis. Stable degenerative changes thoracic spine.  No pulmonary edema. Stable atelectasis or scarring right midlung laterally.  IMPRESSION: Elevation of the left hemidiaphragm.  Bilateral basilar atelectasis.  No focal infiltrate or pulmonary edema.  Original Report Authenticated By: Natasha Mead, M.D.   Ct Head Wo Contrast  09/19/2011  *RADIOLOGY REPORT*  Clinical Data: Confusion, mental status change  CT HEAD WITHOUT CONTRAST  Technique:  Contiguous axial images were obtained from the base of the skull through the vertex without contrast.  Comparison: CT sinus 01/18/2009 which includes a significant amount of information of the brain.  Findings: Hypodensity left parietal cortex compatible with acute infarct.  This was not present on the prior CT.  No associated hemorrhage.  Moderate to advanced atrophy.  Chronic microvascular ischemic changes throughout the white matter.  Negative for hemorrhage.  Heavy calcification of the carotid vertebral arteries.  No acute abnormality of the skull  IMPRESSION: Left parietal hypodensity, compatible with acute infarct.  Extensive atrophy and chronic microvascular ischemic change.  No acute hemorrhage.  Original Report Authenticated By: Camelia Phenes, M.D.   Ct Abdomen W Contrast  09/19/2011  *RADIOLOGY REPORT*  Clinical Data:  Abdominal wall cellulitis.  Hepatitis C.  End-stage renal disease.  CT ABDOMEN WITH CONTRAST  Technique:  Multidetector CT imaging of the abdomen was performed using the standard protocol following bolus administration of intravenous contrast.  Contrast: 80mL OMNIPAQUE IOHEXOL 300 MG/ML  SOLN  Comparison:  03/02/2006.  Findings:  Left base atelectasis.  Artifact of the anterior abdominal wall caused by the patient's habitus.  This limits detection of subtle subcutaneous inflammatory process. At the L5 level in a right paracentral position is an anterior abdominal wall 2 cm collection which may represent scar or inflammation.  This cannot be  confirmed as a drainable abscess on the present exam.  Cardiomegaly.  Coronary artery calcifications.  Atherosclerotic type changes of the lower thoracic and abdominal aorta with ectasia without aneurysmal dilation.  Atherosclerotic type changes of the aorta branch vessels.  Fatty infiltration of the liver without focal hepatic lesion.  No calcified gallstones.  Small calcification within the spleen without focal splenic lesion otherwise noted.  No focal pancreatic lesion.  Atrophic kidneys with bilateral renal cysts.  Some smaller cysts are difficult to confirm as a simple cysts.  No focal adrenal mass.  Fluid filled right colon.  No extraluminal bowel inflammatory  process, free fluid or free air.  Under distended stomach limits evaluation.  Gastric fold appears slightly thickened which may be related to the under distension.  Gastritis difficult to exclude.  Degenerative changes lower thoracic lumbar spine with various degrees of Schmorl's node deformities.  IMPRESSION: Artifact of the anterior abdominal wall caused by the patient's habitus.  This limits detection of subtle subcutaneous inflammatory process. At the L5 level in a right paracentral position is an anterior abdominal wall 2 cm collection which may represent scar or inflammation.  This cannot be confirmed as a drainable abscess on the present exam.  Please see above.  Original Report Authenticated By: Fuller Canada, M.D.  ECHO: Study Conclusions - Left ventricle: The cavity size was normal. Systolic function was vigorous. The estimated ejection fraction was in the range of 65% to 70%. Wall motion was normal; there were no regional wall motion abnormalities. Doppler arameters are consistent with abnormal left ventricular relaxation (grade 1 diastolic dysfunction). - Mitral valve: Moderately calcified annulus. Moderately calcified leaflets .    Medications: I have reviewed the patient's current medications.  Assessment and plan: Principal  Problem:  *CVA (cerebral infarction) -Prophylactic therapy-Antiplatelet med: Aspirin - dose 325 mg PO daily  -fasting lipid panel showed LDL >100, HDL <40. Start statin -MRI, MRA of the brain pending, Carotid dopplers pending -PT consult, OT recommended SNF, Speech consult pending. -Echocardiogram as above. -Cardiac Monitoring, no events -Keep MAP 70 -tobacco counseling cessation.   HYPERLIPIDEMIA See CVA.   HYPERTENSION -Stable continue Norvasc, permissive HTN, goal <180/90.   End stage renal disease: -Per renal   Diabetes mellitus due to underlying condition -hbgA1c 6.5. Well controlled   LOS: 2 days   Marinda Elk M.D. Pager: [phone] [facility] 09/21/2011, 9:57 AM

## 2011-09-21 NOTE — ED Provider Notes (Signed)
I saw and evaluated the patient, reviewed the resident's note and I agree with the findings and plan. Pt with confusion, trouble expressing self/finding words in past day. No numbness/weakness. No headaches. No fever or chills. Currently alert, oriented. Motor intact. Labs ct.   Suzi Roots, MD 09/21/11 [phone]

## 2011-09-21 NOTE — Progress Notes (Signed)
Physical Therapy Treatment Patient Details Name: Sean [name] MRN: [id number] DOB: 19-Jan-1936 Today's Date: 09/21/2011  PT Assessment/Plan  PT - Assessment/Plan Comments on Treatment Session: Followed conversation and direction well through out.  Mobility at min guard to Supervision level. PT Plan: Discharge plan remains appropriate Follow Up Recommendations: Home health PT;Supervision - Intermittent;Supervision/Assistance - 24 hour Equipment Recommended: Defer to next venue PT Goals  Acute Rehab PT Goals PT Goal: Sit to Stand - Progress: Progressing toward goal PT Goal: Stand to Sit - Progress: Progressing toward goal PT Goal: Ambulate - Progress: Progressing toward goal Additional Goals Additional Goal #1: Pt will increase his DGI score by 5 points in order to demonstrate decreased fall risk. PT Goal: Additional Goal #1 - Progress: Progressing toward goal  PT Treatment Precautions/Restrictions  Precautions Precautions: Fall Restrictions Weight Bearing Restrictions: No Mobility (including Balance) Bed Mobility Bed Mobility: No Transfers Transfers: Yes Sit to Stand: Other (comment) (min guard assist) Sit to Stand Details (indicate cue type and reason): uses hands approp., but uses the chair to stabilize himself Stand to Sit: To chair/3-in-1;Other (comment) (min guard assist) Ambulation/Gait Ambulation/Gait: Yes Ambulation/Gait Assistance: Other (comment) (min guard A) Ambulation/Gait Assistance Details (indicate cue type and reason): guarded, but generally steady gait with heel/toe pattern Ambulation Distance (Feet): 170 Feet Assistive device: None Gait Pattern: Step-through pattern;Decreased step length - right;Decreased step length - left;Decreased stride length Gait velocity:  (able to significantly change cadence) Stairs: No  Balance Balance Assessed: No Berg Balance Test Sit to Stand: Able to stand  independently using hands Standing Unsupported: Able to stand  safely 2 minutes Sitting with Back Unsupported but Feet Supported on Floor or Stool: Able to sit safely and securely 2 minutes Stand to Sit: Controls descent by using hands Transfers: Able to transfer safely, minor use of hands Standing Unsupported with Eyes Closed: Able to stand 10 seconds safely Standing Ubsupported with Feet Together: Able to place feet together independently and stand for 1 minute with supervision From Standing, Reach Forward with Outstretched Arm: Can reach forward >12 cm safely (5") From Standing Position, Pick up Object from Floor: Able to pick up shoe, needs supervision From Standing Position, Turn to Look Behind Over each Shoulder: Turn sideways only but maintains balance Turn 360 Degrees: Able to turn 360 degrees safely but slowly Standing Unsupported, Alternately Place Feet on Step/Stool: Able to complete >2 steps/needs minimal assist Standing Unsupported, One Foot in Front: Able to take small step independently and hold 30 seconds Standing on One Leg: Tries to lift leg/unable to hold 3 seconds but remains standing independently Total Score: 39  Dynamic Gait Index Level Surface: Mild Impairment Change in Gait Speed: Mild Impairment Gait with Horizontal Head Turns: Normal Gait with Vertical Head Turns: Normal Gait and Pivot Turn: Mild Impairment Step Over Obstacle: Moderate Impairment Exercise    End of Session PT - End of Session Activity Tolerance: Patient tolerated treatment well Patient left: in chair;with call bell in reach;with family/visitor present Nurse Communication: Mobility status for ambulation;Mobility status for transfers General Behavior During Session: [facility] for tasks performed Cognition: Impaired (but much improved)  Joson Sapp, Eliseo Gum 09/21/2011, 12:56 PM  09/21/2011  [place] Bing, PT [phone] (pager)

## 2011-09-21 NOTE — Progress Notes (Signed)
Clinical Social Work Department BRIEF PSYCHOSOCIAL ASSESSMENT 09/21/2011  Patient:  Sean [name], Sean [name]     Account Number:  [id number]     Admit date:  09/19/2011  Clinical Social Worker:  Pearson Forster  Date/Time:  09/21/2011 02:00 PM  Referred by:  Physician  Date Referred:  09/21/2011 Referred for  SNF Placement   Other Referral:   Interview type:  Patient Other interview type:    PSYCHOSOCIAL DATA Living Status:  ALONE Admitted from facility:   Level of care:   Primary support name:  Rasean Joos [phone] Primary support relationship to patient:  CHILD, ADULT Degree of support available:   strong    CURRENT CONCERNS Current Concerns  Post-Acute Placement   Other Concerns:    SOCIAL WORK ASSESSMENT / PLAN Clinical Social Worker met with the patient and patient daughter at the bedside to discuss plans and offer support. Discussed PT recommendation for SNF placement for rehab. Patient and patient family agreeable to SNF search in [place].  CSW to facilitate SNF search in [facility] and follow up with bed offers.  CSW will continue to follow and facilitate patient discharge once patient medically ready for discharge.   Assessment/plan status:  Psychosocial Support/Ongoing Assessment of Needs Other assessment/ plan:   Discharge planning   Information/referral to community resources:   [facility] list    PATIENT'S/FAMILY'S RESPONSE TO PLAN OF CARE: Patient alert and oriented x3. Patient and patient family were pleasant and appreciative of CSW assistance.  Patient and patient family expressed concern as to whether or not patient family would be allowed to take patient out for visits.  CSW encouraged family to consult with facility to discuss policies that are in place.   Arnette Norris, MSW Intern  [place], [place] [phone]

## 2011-09-21 NOTE — Evaluation (Signed)
Occupational Therapy Evaluation Patient Details Name: Sean [name] MRN: [id number] DOB: [birth year] Today's Date: 09/21/2011  Problem List:  Patient Active Problem List  Diagnoses  . HYPERLIPIDEMIA  . ANXIETY  . HYPERTENSION  . MI  . End stage renal disease  . HEADACHE  . COUGH  . CEREBROVASCULAR ACCIDENT, HX OF  . CVA (cerebral infarction)  . Diabetes mellitus due to underlying condition    Past Medical History:  Past Medical History  Diagnosis Date  . Hyperparathyroidism   . Arthritis   . Hyperlipidemia   . Hypertension   . Chronic kidney disease   . Neuromuscular disorder   . Stroke   . GERD (gastroesophageal reflux disease)   . Headache     occasional  migraine   . Heart attack 2011; 1990's  . Type II diabetes mellitus   . Peripheral neuropathy   . Anxiety   . Depression   . Dialysis patient     M, W, F; [address] (09/19/11)   Past Surgical History:  Past Surgical History  Procedure Date  . Colon surgery 2011    colon resection   . Parathyroidectomy 2011  . Capd insertion 09/12/2011    Procedure: LAPAROSCOPIC INSERTION CONTINUOUS AMBULATORY PERITONEAL DIALYSIS  (CAPD) CATHETER;  Surgeon: Mariella Saa, MD;  Location: [facility] OR;  Service: General;  Laterality: N/A;  . Sp av dialysis shunt access existing *l* 2003  . Dg av dialysis shunt intro ndl*r* or     "not working" (09/19/11)    OT Assessment/Plan/Recommendation OT Assessment Clinical Impression Statement: Pt admitted with left parietal CVA and presents with below problem list complicated by expressive speech as well as processing difficulties. Will benefit from skilled OT in the acute setting to maximize I with ADL and ADL mobility to Mod I-S upon d/c. OT Recommendation/Assessment: Patient will need skilled OT in the acute care venue OT Problem List: Decreased strength;Decreased activity tolerance;Decreased coordination;Impaired vision/perception;Decreased cognition;Decreased knowledge of use  of DME or AE;Decreased knowledge of precautions;Pain;Impaired UE functional use;Impaired sensation OT Therapy Diagnosis : Generalized weakness;Cognitive deficits;Disturbance of vision OT Plan OT Frequency: Min 2X/week OT Treatment/Interventions: Self-care/ADL training;Neuromuscular education;Therapeutic activities;Cognitive remediation/compensation;Visual/perceptual remediation/compensation;Patient/family education;Balance training;DME and/or AE instruction OT Recommendation Follow Up Recommendations: Home health OT;Supervision/Assistance - 24 hour (vs SNF) Equipment Recommended:  (TBD) Individuals Consulted Consulted and Agree with Results and Recommendations: Patient OT Goals Acute Rehab OT Goals OT Goal Formulation: With patient Time For Goal Achievement: 7 days ADL Goals Pt Will Perform Grooming: Independently;Standing at sink ADL Goal: Grooming - Progress: Goal set today Pt Will Perform Upper Body Bathing: Independently;Sitting, chair;Sitting, edge of bed ADL Goal: Upper Body Bathing - Progress: Goal set today Pt Will Perform Lower Body Bathing: with supervision;Sit to stand from chair;Sit to stand from bed ADL Goal: Lower Body Bathing - Progress: Goal set today Pt Will Perform Upper Body Dressing: Independently;Sitting, bed;Sitting, chair ADL Goal: Upper Body Dressing - Progress: Goal set today Pt Will Perform Lower Body Dressing: with supervision;Sit to stand from chair;Sit to stand from bed ADL Goal: Lower Body Dressing - Progress: Goal set today Pt Will Transfer to Toilet: with modified independence;3-in-1;Ambulation;with DME ADL Goal: Toilet Transfer - Progress: Goal set today Pt Will Perform Toileting - Clothing Manipulation: Independently;Standing ADL Goal: Toileting - Clothing Manipulation - Progress: Goal set today Pt Will Perform Toileting - Hygiene: Independently;Sitting on 3-in-1 or toilet;Sit to stand from 3-in-1/toilet ADL Goal: Toileting - Hygiene - Progress: Goal  set today Pt Will Perform Tub/Shower Transfer: Tub transfer;Ambulation;with  supervision ADL Goal: Tub/Shower Transfer - Progress: Goal set today Additional ADL Goal #1: Pt will be I with RUE HEP to improve coordination and strength. ADL Goal: Additional Goal #1 - Progress: Goal set today  OT Evaluation Precautions/Restrictions  Precautions Precautions: Fall Restrictions Weight Bearing Restrictions: No Prior Functioning Home Living Lives With: Alone Type of Home: Apartment Home Access: Elevator Home Layout: One level Bathroom Shower/Tub: Forensic scientist: Standard Bathroom Accessibility: Yes How Accessible: Accessible via walker Home Adaptive Equipment: Straight cane;Other (comment) Prior Function Driving: Yes  ADL ADL Eating/Feeding: Simulated;Independent Where Assessed - Eating/Feeding: Chair Grooming: Performed;Wash/dry face;Supervision/safety Where Assessed - Grooming: Standing at sink Upper Body Bathing: Simulated;Supervision/safety;Set up Where Assessed - Upper Body Bathing: Sitting, bed Lower Body Bathing: Simulated;Minimal assistance Where Assessed - Lower Body Bathing: Sit to stand from bed Upper Body Dressing: Simulated;Minimal assistance Where Assessed - Upper Body Dressing: Sitting, bed Lower Body Dressing: Performed;Supervision/safety Where Assessed - Lower Body Dressing: Sitting, bed Toilet Transfer: Performed;Supervision/safety Toilet Transfer Details (indicate cue type and reason): sit to stand from 3n1 Toilet Transfer Equipment: Bedside commode Toileting - Clothing Manipulation: Simulated;Minimal assistance Where Assessed - Glass blower/designer Manipulation: Standing Toileting - Hygiene: Not assessed Tub/Shower Transfer: Simulated;Minimal assistance Tub/Shower Transfer Details (indicate cue type and reason): simulated in room over trash can. pt with difficulty processing directions. Min A to maintain balance Equipment Used:   (rollator) Ambulation Related to ADLs: Supervision with RW ambulation. VC to remain with walker for turns and before sitting  Vision/Perception  Vision - History Patient Visual Report: Blurring of vision Vision - Assessment Additional Comments: potential right field deficit vs partial homonymous hemianopsia. Pt with irregularly shaped right pupil- pt denies knowing of this PTA, but is also reporting blurry vision since "the beginning of the year" Cognition Cognition Orientation Level: Oriented X4 Sensation/Coordination Sensation Light Touch: Impaired Detail Light Touch Impaired Details: Impaired RUE (Pt with baseline numbness of bilateral fingertips) Additional Comments: pt reports carpal tunnel at baseline Coordination Gross Motor Movements are Fluid and Coordinated: Yes Fine Motor Movements are Fluid and Coordinated: No (Rt hand) Extremity Assessment RUE Strength RUE Overall Strength Comments: WFL for wrist and proximally. Grasp 4/5.  LUE Assessment LUE Assessment: Within Functional Limits Mobility  Bed Mobility Bed Mobility: No Supine to Sit: 5: Supervision;With rails;HOB flat Transfers Sit to Stand: Other (comment) (min guard assist) Sit to Stand Details (indicate cue type and reason): uses hands approp., but uses the chair to stabilize himself Stand to Sit: To chair/3-in-1;Other (comment) (min guard assist) End of Session OT - End of Session Activity Tolerance: Patient tolerated treatment well Patient left: in chair;with call bell in reach (with NT) Nurse Communication: Mobility status for transfers;Mobility status for ambulation General Behavior During Session: [facility] for tasks performed Cognition: Impaired (but much improved)   Sean [name] 09/21/2011, 1:48 PM

## 2011-09-21 NOTE — Progress Notes (Signed)
Radiologist tech tech gave up MRI procedure for this patient,still restless,even after 1.5 mg of i.v. Ativan was given to him.

## 2011-09-21 NOTE — Progress Notes (Addendum)
Clinical Social Work Department CLINICAL SOCIAL WORK PLACEMENT NOTE 09/21/2011  Patient:  Sean [name], Sean [name]  Account Number:  [id number] Admit date:  09/19/2011  Clinical Social Worker:  Macario Golds, Theresia Majors  Date/time:  09/21/2011 02:30 AM  Clinical Social Work is seeking post-discharge placement for this patient at the following level of care:   SKILLED NURSING   (*CSW will update this form in Epic as items are completed)   09/21/2011  Patient/family provided with [facility] Department of Clinical Social Work's list of facilities offering this level of care within the geographic area requested by the patient (or if unable, by the patient's family).  09/21/2011  Patient/family informed of their freedom to choose among providers that offer the needed level of care, that participate in Medicare, Medicaid or managed care program needed by the patient, have an available bed and are willing to accept the patient.  09/21/2011  Patient/family informed of [facility]' ownership interest in [facility], as well as of the fact that they are under no obligation to receive care at this facility.  PASARR submitted to EDS on 09/21/2011 PASARR number received from EDS on   FL2 transmitted to all facilities in geographic area requested by pt/family on  09/21/2011 FL2 transmitted to all facilities within larger geographic area on   Patient informed that his/her managed care company has contracts with or will negotiate with  certain facilities, including the following:     Patient/family informed of bed offers received:  09/22/2011 Patient chooses bed at  [facility] Physician recommends and patient chooses bed at    Patient to be transferred to  on  09/22/2011 Patient to be transferred to facility by  [facility]  The following physician request were entered in Epic:   Additional Comments:  Arnette Norris, MSW Intern  Macario Golds, [place] [phone]

## 2011-09-22 ENCOUNTER — Inpatient Hospital Stay (HOSPITAL_COMMUNITY): Payer: Medicare Other

## 2011-09-22 LAB — CBC
HCT: 29.8 % — ABNORMAL LOW (ref 39.0–52.0)
Hemoglobin: 10 g/dL — ABNORMAL LOW (ref 13.0–17.0)
MCH: 27.8 pg (ref 26.0–34.0)
MCV: 82.8 fL (ref 78.0–100.0)
RBC: 3.6 MIL/uL — ABNORMAL LOW (ref 4.22–5.81)

## 2011-09-22 LAB — RENAL FUNCTION PANEL
CO2: 27 mEq/L (ref 19–32)
Calcium: 9.3 mg/dL (ref 8.4–10.5)
Creatinine, Ser: 8.06 mg/dL — ABNORMAL HIGH (ref 0.50–1.35)
Glucose, Bld: 110 mg/dL — ABNORMAL HIGH (ref 70–99)

## 2011-09-22 LAB — GLUCOSE, CAPILLARY

## 2011-09-22 MED ORDER — HYDROCODONE-ACETAMINOPHEN 5-325 MG PO TABS: 1.0000 | ORAL_TABLET | ORAL | Status: AC | PRN

## 2011-09-22 MED ORDER — ATORVASTATIN CALCIUM 20 MG PO TABS: 20.0000 mg | ORAL_TABLET | Freq: Every day | ORAL | Status: DC

## 2011-09-22 MED ORDER — PARICALCITOL 5 MCG/ML IV SOLN
INTRAVENOUS | Status: AC
  Administered 2011-09-22: 7 ug via INTRAVENOUS
  Filled 2011-09-22: qty 2

## 2011-09-22 MED ORDER — ASPIRIN 325 MG PO TABS: 325.0000 mg | ORAL_TABLET | Freq: Every day | ORAL | Status: DC

## 2011-09-22 MED ORDER — PARICALCITOL 5 MCG/ML IV SOLN: 7.0000 ug | INTRAVENOUS | Status: DC

## 2011-09-22 NOTE — Progress Notes (Signed)
10:40  Clinical Social Worker provided patient and patient family with bed offers.  Patient and patient family chose [facility].  CSW called facility to inform that family accepted the bed offer and confirm bed availability.  CSW spoke with patient daughter regarding facility being able to provide transportation for patient to dialysis at the same center once admitted to their facility.  Patient family requested to provide their own transportation for the patient to the facility at discharge.   16:30  Clinical Social Worker facilitated patient discharge to [facility].  Patient family en route to transport patient to [facility].    Clinical Social Worker will sign off for now as social work intervention is no longer needed. Please consult [place] again if new need arises.  Sean [name], MSW Intern  [place], [place] [phone]

## 2011-09-22 NOTE — Progress Notes (Signed)
Stroke Team Progress Note  HISTORY Sean [name] is an 76 y.o. male with a history of 2 previous small strokes as well as hypertension, diabetes mellitus and end-stage renal disease on dialysis presenting with onset of speech difficulty starting 09/18/2011 following his dialysis session and demonstrating worsening 09/19/2011. He has demonstrated word finding difficulty as well as apparent difficulty understanding what is being said to him at times. CT scan of his head showed findings consistent with acute left parietal stroke. Patient has not been on antiplatelet therapy. NIH stroke score was 3.  Patient was not a TPA candidate secondary to delay in arrival. He was admitted for further evaluation and treatment.  SUBJECTIVE No complaints. In HD. Ready to go home.  OBJECTIVE Most recent Vital Signs: Filed Vitals:   09/22/11 0830 09/22/11 0900 09/22/11 0930 09/22/11 1000  BP: 172/86 164/69 146/77 152/73  Pulse: 64 61 66 67  Temp:      TempSrc:      Resp: 16 16 18 17   Height:      Weight:      SpO2:       CBG (last 3)  Basename 09/21/11 2142 09/21/11 1643 09/21/11 1242  GLUCAP 82 103* 115*   Intake/Output from previous day: 04/11 0701 - 04/12 0700 In: 480 [P.O.:480] Out: -  IV Fluid Intake:     MEDICATIONS    . amLODipine  5 mg Oral Daily  . ampicillin-sulbactam (UNASYN) IV  3 g Intravenous Q24H  . aspirin  300 mg Rectal Daily   Or  . aspirin  325 mg Oral Daily  . atorvastatin  20 mg Oral q1800  . calcium carbonate  1 tablet Oral TID WC  . dialysis solution 1.5% low-MG/low-CA   Intraperitoneal Once in dialysis  . enoxaparin  40 mg Subcutaneous Q24H  . insulin aspart  0-5 Units Subcutaneous QHS  . insulin aspart  0-9 Units Subcutaneous TID WC  . LORazepam  0.5 mg Intravenous Once  . LORazepam  1 mg Intravenous Once  . metoprolol tartrate  25 mg Oral BID  . multivitamin  1 tablet Oral QHS  . paricalcitol  7 mcg Intravenous 3 times weekly  . PARoxetine  10 mg Oral Daily   PRN:  acetaminophen, acetaminophen, heparin, ondansetron (ZOFRAN) IV, oxyCODONE, senna-docusate, DISCONTD: heparin  Diet:  Renal thin liquids Activity:  As tolerated DVT Prophylaxis:  Lovenox 40 mg sq daily   CLINICALLY SIGNIFICANT STUDIES CBC    Component Value Date/Time   WBC 2.3* 09/22/2011 0743   RBC 3.60* 09/22/2011 0743   HGB 10.0* 09/22/2011 0743   HCT 29.8* 09/22/2011 0743   PLT 158 09/22/2011 0743   MCV 82.8 09/22/2011 0743   MCH 27.8 09/22/2011 0743   MCHC 33.6 09/22/2011 0743   RDW 13.7 09/22/2011 0743   LYMPHSABS 0.7 09/19/2011 1012   MONOABS 0.1 09/19/2011 1012   EOSABS 0.0 09/19/2011 1012   BASOSABS 0.0 09/19/2011 1012   CMP    Component Value Date/Time   NA 133* 09/22/2011 0744   K 3.6 09/22/2011 0744   CL 94* 09/22/2011 0744   CO2 27 09/22/2011 0744   GLUCOSE 110* 09/22/2011 0744   BUN 35* 09/22/2011 0744   CREATININE 8.06* 09/22/2011 0744   CALCIUM 9.3 09/22/2011 0744   PROT 6.6 06/29/2009 0438   ALBUMIN 2.7* 09/22/2011 0744   AST 12 06/29/2009 0438   ALT 13 06/29/2009 0438   ALKPHOS 62 06/29/2009 0438   BILITOT 0.8 06/29/2009 0438   GFRNONAA 6*  09/22/2011 0744   GFRAA 7* 09/22/2011 0744   COAGS Lab Results  Component Value Date   INR 1.07 06/27/2009   Lipid Panel    Component Value Date/Time   CHOL 161 09/20/2011 0822   TRIG 121 09/20/2011 0822   HDL 34* 09/20/2011 0822   CHOLHDL 4.7 09/20/2011 0822   VLDL 24 09/20/2011 0822   LDLCALC 103* 09/20/2011 0822   HgbA1C  Lab Results  Component Value Date   HGBA1C 6.5* 09/20/2011   Cardiac Panel (last 3 results) No results found for this basename: CKTOTAL:3,CKMB:3,TROPONINI:3,RELINDX:3 in the last 72 hours Urinalysis No results found for this basename: colorurine,  appearanceur,  labspec,  phurine,  glucoseu,  hgbur,  bilirubinur,  ketonesur,  proteinur,  urobilinogen,  nitrite,  leukocytesur   Urine Drug Screen  No results found for this basename: labopia,  cocainscrnur,  labbenz,  amphetmu,  thcu,  labbarb    Alcohol  Level No results found for this basename: eth   Ct Abdomen W Contrast  09/19/2011   Artifact of the anterior abdominal wall caused by the patient's habitus.  This limits detection of subtle subcutaneous inflammatory process. At the L5 level in a right paracentral position is an anterior abdominal wall 2 cm collection which may represent scar or inflammation.  This cannot be confirmed as a drainable abscess on the present exam.     CT of the brain  Left parietal hypodensity, compatible with acute infarct.  Extensive atrophy and chronic microvascular ischemic change.  No acute hemorrhage.    MRI of the brain  09/21/2011 Moderate sized left parietal lobe infarct.    MRA of the brain  Unable to tolerate  2D Echocardiogram  EF 55-60% with no source of embolus.   Carotid Doppler  No internal carotid artery stenosis bilaterally. Vertebrals with antegrade flow bilaterally.   CXR   09/19/2011    1.  Mild increase in  interstitial edema pattern. 2.  Cardiomegaly and low lung volumes 09/19/2011  Elevation of the left hemidiaphragm.  Bilateral basilar atelectasis.  No focal infiltrate or pulmonary edema  EKG     Physical Exam   Pleasant middle-aged [place] American male currently not in distress.Awake alert. Afebrile. Head is nontraumatic. Neck is supple without bruit. Hearing is normal. Cardiac exam no murmur or gallop. Lungs are clear to auscultation. Distal pulses are well felt.  Neurological exam : awake alert oriented to time and place. Diminished short-term memory registration and recall. Speech is slow and hesitant with some word finding difficulties but no paraphasic errors. He has diminished animal naming and repetition. He is able to name. Extraocular movements are full regular his driver's. He has a right partial homonymous hemianopsia. Tongue is midline. Motor system exam reveals no upper or lower expected risk. Diminished fine finger movements on the right he orbits left over right upper extremity.  There is minimal weakness in the right grip. He moves both lower extremities well against gravity. Gait was not tested.  ASSESSMENT [name] is a 76 y.o. male with a left parietal infarct secondary to unknown etiology, most likely small vessel disease. On no antiplatelets prior to admission. Now on aspirin 325 mg orally every day for secondary stroke prevention.   -ESRD on HD -hyperlipidemia -hypertension -Stroke -headache -Diabetes -Peripheral neuropathy  Hospital day # 3  TREATMENT/PLAN -Continue aspirin 325 mg orally every day for secondary stroke prevention. -Please schedule outpatient telemetry monitoring to assess patient for atrial fibrillation as source of stroke. May  be arranged with patient's cardiologist, or cardiologist of choice.  -Stroke Service will sign off. Follow up with Dr. Pearlean Brownie in 2 months.  Joaquin Music, ANP-BC, GNP-BC [facility] Pager: [phone] 09/22/2011 10:19 AM  Dr. Delia Heady, Stroke Center Medical Director, has personally reviewed chart, pertinent data, examined the patient and developed the plan of care.

## 2011-09-22 NOTE — Discharge Instructions (Signed)
STROKE/TIA DISCHARGE INSTRUCTIONS SMOKING Cigarette smoking nearly doubles your risk of having a stroke & is the single most alterable risk factor  If you smoke or have smoked in the last 12 months, you are advised to quit smoking for your health.  Most of the excess cardiovascular risk related to smoking disappears within a year of stopping.  Ask you doctor about anti-smoking medications  [place] Quit Line: 1-800-QUIT NOW  Free Smoking Cessation Classes [phone]  CHOLESTEROL Know your levels; limit fat & cholesterol in your diet  Lipid Panel     Component Value Date/Time   CHOL 161 09/20/2011 0822   TRIG 121 09/20/2011 0822   HDL 34* 09/20/2011 0822   CHOLHDL 4.7 09/20/2011 0822   VLDL 24 09/20/2011 0822   LDLCALC 103* 09/20/2011 0822      Many patients benefit from treatment even if their cholesterol is at goal.  Goal: Total Cholesterol (CHOL) less than 160  Goal:  Triglycerides (TRIG) less than 150  Goal:  HDL greater than 40  Goal:  LDL (LDLCALC) less than 100   BLOOD PRESSURE American Stroke Association blood pressure target is less that 120/80 mm/Hg  Your discharge blood pressure is:  BP: 152/73 mmHg  Monitor your blood pressure  Limit your salt and alcohol intake  Many individuals will require more than one medication for high blood pressure  DIABETES (A1c is a blood sugar average for last 3 months) Goal HGBA1c is under 7% (HBGA1c is blood sugar average for last 3 months)  Diabetes: Diagnosis of diabetes:  Your A1c:6.5 %    Lab Results  Component Value Date   HGBA1C 6.5* 09/20/2011     Your HGBA1c can be lowered with medications, healthy diet, and exercise.  Check your blood sugar as directed by your physician  Call your physician if you experience unexplained or low blood sugars.  PHYSICAL ACTIVITY/REHABILITATION Goal is 30 minutes at least 4 days per week    {STROKE DC ACTIVITY/REHAB:22359}  Activity decreases your risk of heart attack and stroke and makes  your heart stronger.  It helps control your weight and blood pressure; helps you relax and can improve your mood.  Participate in a regular exercise program.  Talk with your doctor about the best form of exercise for you (dancing, walking, swimming, cycling).  DIET/WEIGHT Goal is to maintain a healthy weight  Your discharge diet is: Renal thin liquids Your height is:  Height: 5' (152.4 cm) Your current weight is: Weight: 103.9 kg (229 lb 0.9 oz) Your Body Mass Index (BMI) is:  BMI (Calculated): 44.4   Following the type of diet specifically designed for you will help prevent another stroke.  Your goal weight range is:  ***  Your goal Body Mass Index (BMI) is 19-24.  Healthy food habits can help reduce 3 risk factors for stroke:  High cholesterol, hypertension, and excess weight.  RESOURCES Stroke/Support Group:  Call [phone]  they meet the 3rd Sunday of the month on the Rehab Unit at [facility], [place] ( no meetings June, July & Aug).  STROKE EDUCATION PROVIDED/REVIEWED AND GIVEN TO PATIENT Stroke warning signs and symptoms How to activate emergency medical system (call 911). Medications prescribed at discharge. Need for follow-up after discharge. Personal risk factors for stroke. Pneumonia vaccine given:   {STROKE DC YES/NO/DATE:22363} Flu vaccine given:   {STROKE DC YES/NO/DATE:22363} My questions have been answered, the writing is legible, and I understand these instructions.  I will adhere to these goals & educational  materials that have been provided to me after my discharge from the hospital.

## 2011-09-22 NOTE — Progress Notes (Signed)
Subjective:  No cos , feelomg better , daughter in room, for dc to [facility] for [facility]hab. Today, no problems with hd today Objective Vital signs in last 24 hours: Filed Vitals:   09/22/11 0930 09/22/11 1000 09/22/11 1030 09/22/11 1100  BP: 146/77 152/73 162/65 142/72  Pulse: 66 67 66 67  Temp:      TempSrc:      Resp: 18 17 18 20   Height:      Weight:      SpO2:       Weight change: -0.4 kg (-14.1 oz)  Intake/Output Summary (Last 24 hours) at 09/22/11 1341 Last data filed at 09/22/11 0900  Gross per 24 hour  Intake    240 ml  Output      0 ml  Net    240 ml   Labs: Basic Metabolic Panel:  Lab 09/22/11 4098 09/20/11 1500 09/20/11 0822  NA 133* 132* 135  K 3.6 3.7 4.1  CL 94* 92* 94*  CO2 27 28 28   GLUCOSE 110* 156* 135*  BUN 35* 45* 38*  CREATININE 8.06* 9.29* 9.07*  CALCIUM 9.3 9.6 9.3  ALB -- -- --  PHOS 4.2 4.0 4.2   Liver Function Tests:  Lab 09/22/11 0744 09/20/11 1500 09/20/11 0822  AST -- -- --  ALT -- -- --  ALKPHOS -- -- --  BILITOT -- -- --  PROT -- -- --  ALBUMIN 2.7* 2.7* 2.9*   No results found for this basename: LIPASE:3,AMYLASE:3 in the last 168 hours No results found for this basename: AMMONIA:3 in the last 168 hours CBC:  Lab 09/22/11 0743 09/20/11 1500 09/20/11 0822 09/19/11 1012  WBC 2.3* 1.8* 1.7* --  NEUTROABS -- -- -- 1.5*  HGB 10.0* 9.7* 10.7* --  HCT 29.8* 29.2* 32.4* --  MCV 82.8 83.4 84.4 85.1  PLT 158 164 160 --   Cardiac Enzymes: No results found for this basename: CKTOTAL:5,CKMB:5,CKMBINDEX:5,TROPONINI:5 in the last 168 hours CBG:  Lab 09/22/11 1249 09/21/11 2142 09/21/11 1643 09/21/11 1242 09/21/11 0751  GLUCAP 83 82 103* 115* 96    Iron Studies: No results found for this basename: IRON,TIBC,TRANSFERRIN,FERRITIN in the last 72 hours Studies/Results: Mr [employer] W/o Cm  09/21/2011  *RADIOLOGY REPORT*  Clinical Data: Slurred speech.  MRI HEAD WITHOUT CONTRAST  Technique:  Multiplanar, multiecho pulse sequences of the brain and  surrounding structures were obtained according to standard protocol without intravenous contrast.  Comparison: 09/19/2011 head CT.  No comparison MR.  Findings: The present examination is limited to a diffusion sequence.  This reveals left parietal lobe moderate sized infarct with extension into the posterior left temporal lobe/posterior left periopercular region.  Artifact superior cerebellar/vermis.  Gradient sequence was not obtained to evaluate for possibility of hemorrhage.  Atrophy without hydrocephalus.  No obvious mass noted on this limited exam.  IMPRESSION: Moderate sized left parietal lobe infarct.  Gradient sequence was not obtained to evaluate for possibility of hemorrhage.  To be called to floor by Maryruth Hancock MR technologist.  Original Report Authenticated By: Fuller Canada, M.D.   Medications:      . amLODipine  5 mg Oral Daily  . ampicillin-sulbactam (UNASYN) IV  3 g Intravenous Q24H  . aspirin  300 mg Rectal Daily   Or  . aspirin  325 mg Oral Daily  . atorvastatin  20 mg Oral q1800  . calcium carbonate  1 tablet Oral TID WC  . dialysis solution 1.5% low-MG/low-CA   Intraperitoneal Once in  dialysis  . enoxaparin  40 mg Subcutaneous Q24H  . insulin aspart  0-5 Units Subcutaneous QHS  . insulin aspart  0-9 Units Subcutaneous TID WC  . LORazepam  0.5 mg Intravenous Once  . LORazepam  1 mg Intravenous Once  . metoprolol tartrate  25 mg Oral BID  . multivitamin  1 tablet Oral QHS  . paricalcitol  7 mcg Intravenous 3 times weekly  . PARoxetine  10 mg Oral Daily   I  have reviewed scheduled and prn medications.  Physical Exam: General:  Alert. Nad,  Mild hesitancey with speech but  ox3 Heart: RRR, no rub or murmur Lungs:  CTA bilaterally Abdomen:  soft and nontender, PD catheter on right Extremities: Dialysis Access: no pedal edema, positive bruit LFA AVGG   Problem/Plan: 1  Acute CVA with mild right hemiparesis, improving- for discharge to SNF today 2  ESRD - on HD on  MWF, K stable at 3.6  today 3.  Recent PD cath placement- cell count high 6000 on 1st sample, but repeat after a dwell showed cell count of 45 which is normal. No evidence of peritonitis clinically, no organisms on gram stain. Culture on the PD fluid showing no growth today and no organism from 09/21/11 sample. Will  resume once weekly flushing of new catheter at d/c. Antibiotics were stopped. Surg does not think exit site is infected either.  4.   Hypertension/volume - op meds are Norvasc 5 mg qhs and Metoprolol 25 mg bid; chest x-ray shows bilateral basilar atelectasis without edema or infiltrate, wt 102.8 kg post-HD wednesday with net UF of 2.6 L (EDW 103 kg). Wt today 103.9 kg today post hd 5.   Anemia - Hgb 10.0, no IV Fe. Aranesp restarted. epo to be given at center 6.   Metabolic bone disease - Ca 9., P 4.3 on Zemplar 7 mcg per HD, Tums 500 3ac tid as at home 7.   Nutrition - last Alb 2.7 on 3/20. Supplements will be given at outpt. unit 8.    IDDM 9.    PVD - on Plavix as outpatient.  Lenny Pastel, PA September 22, 2011  12:00 pm  Patient seen and examined and agree with assessment and plan as above.  Vinson Moselle  MD [facility] [phone] pgr    [phone] cell 09/22/2011, 3:46 PM

## 2011-09-22 NOTE — Progress Notes (Signed)
Subjective: No complains.  Objective: Filed Vitals:   09/22/11 0730 09/22/11 0800 09/22/11 0830 09/22/11 0900  BP: 175/83 160/82 172/86 164/69  Pulse: 65 62 64 61  Temp:      TempSrc:      Resp: 17 18 16 16   Height:      Weight:      SpO2:       Weight change: -0.4 kg (-14.1 oz)  Intake/Output Summary (Last 24 hours) at 09/22/11 1013 Last data filed at 09/22/11 0900  Gross per 24 hour  Intake    480 ml  Output      0 ml  Net    480 ml    General: Alert, awake, oriented x3, in no acute distress.  HEENT: No bruits, no goiter.  Heart: Regular rate and rhythm, without murmurs, rubs, gallops.  Lungs: good air movement, CTA b/l. Abdomen: Soft, nontender, nondistended, positive bowel sounds.  Neuro: Grossly intact, nonfocal.   Lab Results:  Basename 09/22/11 0744 09/20/11 1500 09/20/11 0822 09/19/11 1356  NA 133* 132* 135 137  K 3.6 3.7 4.1 3.8  CL 94* 92* 94* 94*  CO2 27 28 28 28   GLUCOSE 110* 156* 135* 82  BUN 35* 45* 38* 28*  CREATININE 8.06* 9.29* 9.07* 7.33*  CALCIUM 9.3 9.6 9.3 9.1  MG -- -- -- --  PHOS 4.2 4.0 4.2 --    Basename 09/22/11 0744 09/20/11 1500  AST -- --  ALT -- --  ALKPHOS -- --  BILITOT -- --  PROT -- --  ALBUMIN 2.7* 2.7*   No results found for this basename: LIPASE:2,AMYLASE:2 in the last 72 hours  Basename 09/22/11 0743 09/20/11 1500  WBC 2.3* 1.8*  NEUTROABS -- --  HGB 10.0* 9.7*  HCT 29.8* 29.2*  MCV 82.8 83.4  PLT 158 164   No results found for this basename: CKTOTAL:3,CKMB:3,CKMBINDEX:3,TROPONINI:3 in the last 72 hours No components found with this basename: POCBNP:3 No results found for this basename: DDIMER:2 in the last 72 hours  Basename 09/20/11 1500 09/20/11 0822  HGBA1C 6.5* 6.0*    Basename 09/20/11 0822  CHOL 161  HDL 34*  LDLCALC 103*  TRIG 121  CHOLHDL 4.7  LDLDIRECT --   No results found for this basename: TSH,T4TOTAL,FREET3,T3FREE,THYROIDAB in the last 72 hours  Basename 09/19/11 1356  VITAMINB12  --  FOLATE --  FERRITIN --  TIBC --  IRON --  RETICCTPCT 0.5    Micro Results: Recent Results (from the past 240 hour(s))  GRAM STAIN     Status: Normal   Collection Time   09/19/11 11:47 AM      Component Value Range Status Comment   Specimen Description PERITONEAL CAVITY   Final    Special Requests NONE   Final    Gram Stain     Final    Value: ABUNDANT WBC PRESENT,BOTH PMN AND MONONUCLEAR     NO ORGANISMS SEEN   Report Status 09/19/2011 FINAL   Final   BODY FLUID CULTURE     Status: Normal (Preliminary result)   Collection Time   09/19/11 11:47 AM      Component Value Range Status Comment   Specimen Description FLUID PERITONEAL DIALYSIS   Final    Special Requests CX ADDED 09/21/11 1100   Final    Gram Stain     Final    Value: ABUNDANT WBC PRESENT,BOTH PMN AND MONONUCLEAR     NO ORGANISMS SEEN     Performed at [facility]   Culture PENDING   Incomplete    Report Status PENDING   Incomplete   CULTURE, BLOOD (ROUTINE X 2)     Status: Normal (Preliminary result)   Collection Time   09/19/11  1:25 PM      Component Value Range Status Comment   Specimen Description BLOOD RIGHT ARM   Final    Special Requests BOTTLES DRAWN AEROBIC AND ANAEROBIC 10CC   Final    Culture  Setup Time [id number]   Final    Culture     Final    Value:        BLOOD CULTURE RECEIVED NO GROWTH TO DATE CULTURE WILL BE HELD FOR 5 DAYS BEFORE ISSUING A FINAL NEGATIVE REPORT   Report Status PENDING   Incomplete     Studies/Results: Mr Brain Ltd W/o Cm  09/21/2011  *RADIOLOGY REPORT*  Clinical Data: Slurred speech.  MRI HEAD WITHOUT CONTRAST  Technique:  Multiplanar, multiecho pulse sequences of the brain and surrounding structures were obtained according to standard protocol without intravenous contrast.  Comparison: 09/19/2011 head CT.  No comparison MR.  Findings: The present examination is limited to a diffusion sequence.  This reveals left parietal lobe moderate sized infarct with extension into  the posterior left temporal lobe/posterior left periopercular region.  Artifact superior cerebellar/vermis.  Gradient sequence was not obtained to evaluate for possibility of hemorrhage.  Atrophy without hydrocephalus.  No obvious mass noted on this limited exam.  IMPRESSION: Moderate sized left parietal lobe infarct.  Gradient sequence was not obtained to evaluate for possibility of hemorrhage.  To be called to floor by Maryruth Hancock MR technologist.  Original Report Authenticated By: Fuller Canada, M.D.  ECHO: Study Conclusions - Left ventricle: The cavity size was normal. Systolic function was vigorous. The estimated ejection fraction was in the range of 65% to 70%. Wall motion was normal; there were no regional wall motion abnormalities. Doppler arameters are consistent with abnormal left ventricular relaxation (grade 1 diastolic dysfunction). - Mitral valve: Moderately calcified annulus. Moderately calcified leaflets .    Medications: I have reviewed the patient's current medications.  Assessment and plan: Principal Problem:  *CVA (cerebral infarction) -Prophylactic therapy-Antiplatelet med: Aspirin - dose 325 mg PO daily  -fasting lipid panel showed LDL >100, HDL <40. Start statin -MRI, MRA , Moderate sized left parietal lobe infarct.Carotid dopplers Preliminary report: There is no ICA stenosis. Vertebral artery flow is antegrade. -PT consult, OT recommended SNF, Speech consult pending. -Echocardiogram as above. -Cardiac Monitoring, no events -Keep MAP 70    HYPERLIPIDEMIA See CVA.   HYPERTENSION -Stable continue Norvasc, permissive HTN, goal <180/90.   End stage renal disease: -Per renal   Diabetes mellitus due to underlying condition -hbgA1c 6.5. Well controlled   LOS: 3 days   Marinda Elk M.D. Pager: [phone] [facility] 09/22/2011, 10:13 AM

## 2011-09-22 NOTE — Discharge Summary (Signed)
Admit date: 09/19/2011 Discharge date: 09/22/2011  Primary Care Physician:  Dorrene German, MD, MD   Discharge Diagnoses:   Active Hospital Problems  Diagnoses Date Noted   . Diabetes mellitus due to underlying condition 09/19/2011   . HYPERLIPIDEMIA 01/15/2009   . HYPERTENSION 01/15/2009   . End stage renal disease 01/15/2009   . MI 01/15/2009     Resolved Hospital Problems  Diagnoses Date Noted Date Resolved  . CVA (cerebral infarction) 09/19/2011 09/22/2011     DISCHARGE MEDICATION: Medication List  As of 09/22/2011 10:39 AM   TAKE these medications         amLODipine 10 MG tablet   Commonly known as: NORVASC   Take 10 mg by mouth daily.      aspirin 325 MG tablet   Take 1 tablet (325 mg total) by mouth daily.      atorvastatin 20 MG tablet   Commonly known as: LIPITOR   Take 1 tablet (20 mg total) by mouth daily at 6 PM.      b complex-vitamin c-folic acid 0.8 MG Tabs   Take 0.8 mg by mouth at bedtime.      calcium carbonate 500 MG chewable tablet   Commonly known as: TUMS - dosed in mg elemental calcium   Chew 1 tablet by mouth 3 (three) times daily with meals.      HYDROcodone-acetaminophen 5-325 MG per tablet   Commonly known as: NORCO   Take 1-2 tablets by mouth every 4 (four) hours as needed.      hydrOXYzine 10 MG tablet   Commonly known as: ATARAX/VISTARIL   Take 10 mg by mouth at bedtime as needed. For sleep      loratadine 10 MG tablet   Commonly known as: CLARITIN   Take 10 mg by mouth daily.      metoprolol succinate 25 MG 24 hr tablet   Commonly known as: TOPROL-XL   Take 25 mg by mouth 2 (two) times daily.      paricalcitol 5 MCG/ML injection   Commonly known as: ZEMPLAR   Inject 1.4 mLs (7 mcg total) into the vein 3 (three) times a week.              Consults: Treatment Team:  Maree Krabbe, MD Mariella Saa, MD   SIGNIFICANT DIAGNOSTIC STUDIES:  Dg Chest 2 View  09/19/2011  *RADIOLOGY REPORT*  Clinical Data: Stroke,  cough  CHEST - 2 VIEW  Comparison: Chest radiograph 09/19/2011  Findings: Heart silhouette is enlarged.  There are low lung volumes.  There is mild interstitial edema pattern increased from prior.  No pneumothorax.  No overt pulmonary edema.  No pleural fluid evident.  IMPRESSION:  1.  Mild increase in  interstitial edema pattern. 2.  Cardiomegaly and low lung volumes.  Original Report Authenticated By: Genevive Bi, M.D.   Dg Chest 2 View  09/19/2011  *RADIOLOGY REPORT*  Clinical Data: Altered mental status  CHEST - 2 VIEW  Comparison: 09/05/2011  Findings: Cardiomediastinal silhouette is stable.  Elevation of the left hemidiaphragm again noted.  Bilateral basilar atelectasis. Stable degenerative changes thoracic spine.  No pulmonary edema. Stable atelectasis or scarring right midlung laterally.  IMPRESSION: Elevation of the left hemidiaphragm.  Bilateral basilar atelectasis.  No focal infiltrate or pulmonary edema.  Original Report Authenticated By: Natasha Mead, M.D.   Dg Chest 2 View  09/05/2011  *RADIOLOGY REPORT*  Clinical Data: Preop radiograph  CHEST - 2 VIEW  Comparison: 06/27/2009  Findings:  Heart size is normal.  There is no pleural effusion or pulmonary edema.  Slight asymmetric elevation of the left hemidiaphragm is noted.  There is a tortuous, unfolded aorta with calcified atherosclerotic disease.  IMPRESSION:  1.  No acute cardiopulmonary abnormalities.  Original Report Authenticated By: Rosealee Albee, M.D.   Ct Head Wo Contrast  09/19/2011  *RADIOLOGY REPORT*  Clinical Data: Confusion, mental status change  CT HEAD WITHOUT CONTRAST  Technique:  Contiguous axial images were obtained from the base of the skull through the vertex without contrast.  Comparison: CT sinus 01/18/2009 which includes a significant amount of information of the brain.  Findings: Hypodensity left parietal cortex compatible with acute infarct.  This was not present on the prior CT.  No associated hemorrhage.  Moderate  to advanced atrophy.  Chronic microvascular ischemic changes throughout the white matter.  Negative for hemorrhage.  Heavy calcification of the carotid vertebral arteries.  No acute abnormality of the skull  IMPRESSION: Left parietal hypodensity, compatible with acute infarct.  Extensive atrophy and chronic microvascular ischemic change.  No acute hemorrhage.  Original Report Authenticated By: Camelia Phenes, M.D.   Ct Abdomen W Contrast  09/19/2011  *RADIOLOGY REPORT*  Clinical Data:  Abdominal wall cellulitis.  Hepatitis C.  End-stage renal disease.  CT ABDOMEN WITH CONTRAST  Technique:  Multidetector CT imaging of the abdomen was performed using the standard protocol following bolus administration of intravenous contrast.  Contrast: 80mL OMNIPAQUE IOHEXOL 300 MG/ML  SOLN  Comparison:  03/02/2006.  Findings:  Left base atelectasis.  Artifact of the anterior abdominal wall caused by the patient's habitus.  This limits detection of subtle subcutaneous inflammatory process. At the L5 level in a right paracentral position is an anterior abdominal wall 2 cm collection which may represent scar or inflammation.  This cannot be confirmed as a drainable abscess on the present exam.  Cardiomegaly.  Coronary artery calcifications.  Atherosclerotic type changes of the lower thoracic and abdominal aorta with ectasia without aneurysmal dilation.  Atherosclerotic type changes of the aorta branch vessels.  Fatty infiltration of the liver without focal hepatic lesion.  No calcified gallstones.  Small calcification within the spleen without focal splenic lesion otherwise noted.  No focal pancreatic lesion.  Atrophic kidneys with bilateral renal cysts.  Some smaller cysts are difficult to confirm as a simple cysts.  No focal adrenal mass.  Fluid filled right colon.  No extraluminal bowel inflammatory process, free fluid or free air.  Under distended stomach limits evaluation.  Gastric fold appears slightly thickened which may be  related to the under distension.  Gastritis difficult to exclude.  Degenerative changes lower thoracic lumbar spine with various degrees of Schmorl's node deformities.  IMPRESSION: Artifact of the anterior abdominal wall caused by the patient's habitus.  This limits detection of subtle subcutaneous inflammatory process. At the L5 level in a right paracentral position is an anterior abdominal wall 2 cm collection which may represent scar or inflammation.  This cannot be confirmed as a drainable abscess on the present exam.  Please see above.  Original Report Authenticated By: Fuller Canada, M.D.   Mr Brain Ltd W/o Cm  09/21/2011  *RADIOLOGY REPORT*  Clinical Data: Slurred speech.  MRI HEAD WITHOUT CONTRAST  Technique:  Multiplanar, multiecho pulse sequences of the brain and surrounding structures were obtained according to standard protocol without intravenous contrast.  Comparison: 09/19/2011 head CT.  No comparison MR.  Findings: The present examination is limited to a  diffusion sequence.  This reveals left parietal lobe moderate sized infarct with extension into the posterior left temporal lobe/posterior left periopercular region.  Artifact superior cerebellar/vermis.  Gradient sequence was not obtained to evaluate for possibility of hemorrhage.  Atrophy without hydrocephalus.  No obvious mass noted on this limited exam.  IMPRESSION: Moderate sized left parietal lobe infarct.  Gradient sequence was not obtained to evaluate for possibility of hemorrhage.  To be called to floor by Maryruth Hancock MR technologist.  Original Report Authenticated By: Fuller Canada, M.D.     ECHO: - Left ventricle: The cavity size was normal. Systolic function was vigorous. The estimated ejection fraction was in the range of 65% to 70%. Wall motion was normal; there were no regional wall motion abnormalities. Doppler parameters are consistent with abnormal left ventricular relaxation (grade 1 diastolic dysfunction).   Recent  Results (from the past 240 hour(s))  GRAM STAIN     Status: Normal   Collection Time   09/19/11 11:47 AM      Component Value Range Status Comment   Specimen Description PERITONEAL CAVITY   Final    Special Requests NONE   Final    Gram Stain     Final    Value: ABUNDANT WBC PRESENT,BOTH PMN AND MONONUCLEAR     NO ORGANISMS SEEN   Report Status 09/19/2011 FINAL   Final   BODY FLUID CULTURE     Status: Normal (Preliminary result)   Collection Time   09/19/11 11:47 AM      Component Value Range Status Comment   Specimen Description FLUID PERITONEAL DIALYSIS   Final    Special Requests CX ADDED 09/21/11 1100   Final    Gram Stain     Final    Value: ABUNDANT WBC PRESENT,BOTH PMN AND MONONUCLEAR     NO ORGANISMS SEEN     Performed at [facility]   Culture PENDING   Incomplete    Report Status PENDING   Incomplete   CULTURE, BLOOD (ROUTINE X 2)     Status: Normal (Preliminary result)   Collection Time   09/19/11  1:25 PM      Component Value Range Status Comment   Specimen Description BLOOD RIGHT ARM   Final    Special Requests BOTTLES DRAWN AEROBIC AND ANAEROBIC 10CC   Final    Culture  Setup Time [id number]   Final    Culture     Final    Value:        BLOOD CULTURE RECEIVED NO GROWTH TO DATE CULTURE WILL BE HELD FOR 5 DAYS BEFORE ISSUING A FINAL NEGATIVE REPORT   Report Status PENDING   Incomplete     BRIEF ADMITTING H & P: 76 year old gentleman with h/o prior strokes, CAD, DM, HTN, on aspirin, reports slurring of speech and difficulty finding words and forgetful since yesterday after dialysis. He came to ED today and underwent a CT HEAD showing an acute left parietal stroke. Pt reports taking aspirin intermittently. He denies any other complaints. He reports visual problems in the right eye since many years.    No resolved problems to display.  Active Hospital Problems  Diagnoses Date Noted   . CVA (cerebral infarction); He was admitted to the hospital start on aspirin  neurology was consulted they recommended an MRI, carotid Doppler, and a 2-D echo with results as above. MRI shows a new stroke. Neurology recommended a return as an outpatient. Which has been scheduled for bowel  cardiology as an outpatient. Risk modifications were done he was started on a statin, his blood pressure was tightly controlled in the hospital.he will followup with Dr. Pearlean Brownie as an outpatient  09/19/2011   . Diabetes mellitus due to underlying condition: His hemoglobin A1c is actually very good it was 6.5 no changes were made.  09/19/2011   . HYPERLIPIDEMIA: He was started on a statin he would continue as an outpatient we'll need to see him in 6 weeks.  01/15/2009   . HYPERTENSION: Initially his blood pressure was not tightly controlled due to the risk of worsening stroke. After 4 days in the hospital he was started on blood pressure medication his blood pressure started to trend down slowly. He'll follow up with his primary care Dr. which will titrate his blood pressure medication as tolerated.  01/15/2009   . End stage renal disease: As per Third was removed initially he was started empirically on Unasyn, secondary to concern of peritoneal dialysis catheter infection. Initially his peritoneal fluid shows 6000 white blood cells count, repeated cranial fluid analysis showed only 45 blood cell count. Surgery and nephrology thought this was probably postsurgical. So Unasyn was stopped. He continues dialysis here in the hospital. He will follow up with a nephrologist an outpatient.  01/15/2009     Resolved Hospital Problems  Diagnoses Date Noted Date Resolved     Disposition and Follow-up:  The Doppler cardiology in one week for Holter monitor. We will give you a call Monday. Discharge Orders    Future Orders Please Complete By Expires   Diet - low sodium heart healthy      Increase activity slowly        Follow-up Information    Follow up with AVBUERE,EDWIN A, MD in 2 weeks.           DISCHARGE EXAM:  See progress note  Blood pressure 152/73, pulse 67, temperature 98.5 F (36.9 C), temperature source Oral, resp. rate 17, height 5' (1.524 m), weight 103.9 kg (229 lb 0.9 oz), SpO2 98.00%.   Basename 09/22/11 0744 09/20/11 1500  NA 133* 132*  K 3.6 3.7  CL 94* 92*  CO2 27 28  GLUCOSE 110* 156*  BUN 35* 45*  CREATININE 8.06* 9.29*  CALCIUM 9.3 9.6  MG -- --  PHOS 4.2 4.0    Basename 09/22/11 0744 09/20/11 1500  AST -- --  ALT -- --  ALKPHOS -- --  BILITOT -- --  PROT -- --  ALBUMIN 2.7* 2.7*   No results found for this basename: LIPASE:2,AMYLASE:2 in the last 72 hours  Basename 09/22/11 0743 09/20/11 1500  WBC 2.3* 1.8*  NEUTROABS -- --  HGB 10.0* 9.7*  HCT 29.8* 29.2*  MCV 82.8 83.4  PLT 158 164    Signed: Marinda Elk M.D. 09/22/2011, 10:39 AM

## 2011-09-22 NOTE — Progress Notes (Signed)
Attempted therapy x2, pt in HD, now awaiting d/c to [facility]. Will defer therapy.  Harlon Ditty, MA CCC-SLP [phone]

## 2011-09-25 LAB — BODY FLUID CULTURE: Culture: NO GROWTH

## 2011-09-26 LAB — CULTURE, BLOOD (ROUTINE X 2)
Culture  Setup Time: 201304100007
Culture: NO GROWTH

## 2011-10-19 ENCOUNTER — Encounter (HOSPITAL_BASED_OUTPATIENT_CLINIC_OR_DEPARTMENT_OTHER): Payer: Medicare Other | Attending: Internal Medicine

## 2011-10-19 DIAGNOSIS — I10 Essential (primary) hypertension: Secondary | ICD-10-CM | POA: Insufficient documentation

## 2011-10-19 DIAGNOSIS — E119 Type 2 diabetes mellitus without complications: Secondary | ICD-10-CM | POA: Insufficient documentation

## 2011-10-19 DIAGNOSIS — L988 Other specified disorders of the skin and subcutaneous tissue: Secondary | ICD-10-CM | POA: Insufficient documentation

## 2011-10-19 DIAGNOSIS — Z8673 Personal history of transient ischemic attack (TIA), and cerebral infarction without residual deficits: Secondary | ICD-10-CM | POA: Insufficient documentation

## 2011-10-19 DIAGNOSIS — L97409 Non-pressure chronic ulcer of unspecified heel and midfoot with unspecified severity: Secondary | ICD-10-CM | POA: Insufficient documentation

## 2011-10-19 DIAGNOSIS — Z992 Dependence on renal dialysis: Secondary | ICD-10-CM | POA: Insufficient documentation

## 2011-10-19 DIAGNOSIS — Z79899 Other long term (current) drug therapy: Secondary | ICD-10-CM | POA: Insufficient documentation

## 2011-10-19 DIAGNOSIS — E669 Obesity, unspecified: Secondary | ICD-10-CM | POA: Insufficient documentation

## 2011-10-19 DIAGNOSIS — Z7982 Long term (current) use of aspirin: Secondary | ICD-10-CM | POA: Insufficient documentation

## 2011-10-19 NOTE — Progress Notes (Signed)
[facility]  NAME:  Sean [name], Sean [name] NO.:  [id number]  MEDICAL RECORD NO.:  [id number]      DATE OF BIRTH:  1935/11/03  PHYSICIAN:  Ardath Sax, M.D.           VISIT DATE:                                  OFFICE VISIT   Sean [name] is an obese diabetic man who is on dialysis, also has a history of hypertension.  He had CVA about a year ago, but does not have much in the way of sequela.  He has had history of hypertension and he is on metoprolol 25 mg a day.  He also takes hydroxyzine.  He is on amlodipine 10 mg a day, calcium carbonate, aspirin, simvastatin, Lipitor, and he takes vitamins.  His blood pressure while he was here today was 140/84, his pulse is 70, respirations 20, temperature 98.6. 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  He had an area which just look like calciphylaxis right over his Achilles tendon on the right ankle.  I simply debrided it off, although this was very tender and we will treat it with silver alginate. He will come back in a week and we will recheck it.     Ardath Sax, M.D.     PP/MEDQ  D:  10/19/2011  T:  10/19/2011  Job:  [id number]

## 2011-11-17 ENCOUNTER — Encounter (HOSPITAL_COMMUNITY): Payer: Self-pay | Admitting: *Deleted

## 2011-11-17 ENCOUNTER — Emergency Department (HOSPITAL_COMMUNITY)
Admission: EM | Admit: 2011-11-17 | Discharge: 2011-11-17 | Disposition: A | Discharge status: Home / Self Care | Payer: Medicare Other | Attending: Emergency Medicine | Admitting: Emergency Medicine

## 2011-11-17 DIAGNOSIS — K659 Peritonitis, unspecified: Secondary | ICD-10-CM

## 2011-11-17 DIAGNOSIS — K658 Other peritonitis: Secondary | ICD-10-CM | POA: Insufficient documentation

## 2011-11-17 DIAGNOSIS — I120 Hypertensive chronic kidney disease with stage 5 chronic kidney disease or end stage renal disease: Secondary | ICD-10-CM | POA: Insufficient documentation

## 2011-11-17 DIAGNOSIS — Z79899 Other long term (current) drug therapy: Secondary | ICD-10-CM | POA: Insufficient documentation

## 2011-11-17 DIAGNOSIS — Z8739 Personal history of other diseases of the musculoskeletal system and connective tissue: Secondary | ICD-10-CM | POA: Insufficient documentation

## 2011-11-17 DIAGNOSIS — N186 End stage renal disease: Secondary | ICD-10-CM | POA: Insufficient documentation

## 2011-11-17 DIAGNOSIS — Z7982 Long term (current) use of aspirin: Secondary | ICD-10-CM | POA: Insufficient documentation

## 2011-11-17 DIAGNOSIS — Z992 Dependence on renal dialysis: Secondary | ICD-10-CM

## 2011-11-17 DIAGNOSIS — E119 Type 2 diabetes mellitus without complications: Secondary | ICD-10-CM | POA: Insufficient documentation

## 2011-11-17 DIAGNOSIS — E785 Hyperlipidemia, unspecified: Secondary | ICD-10-CM | POA: Insufficient documentation

## 2011-11-17 DIAGNOSIS — Z8673 Personal history of transient ischemic attack (TIA), and cerebral infarction without residual deficits: Secondary | ICD-10-CM | POA: Insufficient documentation

## 2011-11-17 LAB — COMPREHENSIVE METABOLIC PANEL
ALT: 7 U/L (ref 0–53)
AST: 12 U/L (ref 0–37)
Albumin: 3 g/dL — ABNORMAL LOW (ref 3.5–5.2)
Alkaline Phosphatase: 58 U/L (ref 39–117)
CO2: 29 mEq/L (ref 19–32)
Chloride: 93 mEq/L — ABNORMAL LOW (ref 96–112)
Creatinine, Ser: 7.14 mg/dL — ABNORMAL HIGH (ref 0.50–1.35)
GFR calc non Af Amer: 7 mL/min — ABNORMAL LOW (ref >90)
Potassium: 3.5 mEq/L (ref 3.5–5.1)
Total Bilirubin: 0.5 mg/dL (ref 0.3–1.2)

## 2011-11-17 LAB — DIFFERENTIAL
Basophils Absolute: 0 10*3/uL (ref 0.0–0.1)
Lymphocytes Relative: 4 % — ABNORMAL LOW (ref 12–46)
Monocytes Absolute: 1.3 10*3/uL — ABNORMAL HIGH (ref 0.1–1.0)
Monocytes Relative: 11 % (ref 3–12)
Neutro Abs: 9.8 10*3/uL — ABNORMAL HIGH (ref 1.7–7.7)
Neutrophils Relative %: 85 % — ABNORMAL HIGH (ref 43–77)

## 2011-11-17 LAB — CBC
HCT: 30.6 % — ABNORMAL LOW (ref 39.0–52.0)
Hemoglobin: 10.2 g/dL — ABNORMAL LOW (ref 13.0–17.0)
RDW: 14.8 % (ref 11.5–15.5)
WBC: 11.6 10*3/uL — ABNORMAL HIGH (ref 4.0–10.5)

## 2011-11-17 MED ORDER — VANCOMYCIN HCL 1000 MG IV SOLR
2000.0000 mg | Freq: Once | INTRAVENOUS | Status: AC
  Administered 2011-11-17: 2000 mg via INTRAVENOUS
  Filled 2011-11-17: qty 2000

## 2011-11-17 NOTE — ED Notes (Signed)
Attempted iv stick, unable pt requested iv teamto be paged.

## 2011-11-17 NOTE — ED Notes (Signed)
To ED via [facility] for eval of RLQ abd pain at peritoneal dialysis site accompanied with nausea and diarrhea x's 1-2 days. Decreased appetite Confusion of when last dialysis was either on Wednesday or Thursday. On arrival no distress. Per EMS bp 138/68, p 98, CBG 135.

## 2011-11-17 NOTE — Discharge Instructions (Signed)
Go to your Dialysis center to get hemodialysis today.

## 2011-11-17 NOTE — ED Notes (Signed)
Patient is resting comfortably. 

## 2011-11-17 NOTE — ED Provider Notes (Signed)
History     CSN: [id number]  Arrival date & time 11/17/11  2130   First MD Initiated Contact with Patient 11/17/11 [phone]      Chief Complaint  Patient presents with  . Abdominal Pain    (Consider location/radiation/quality/duration/timing/severity/associated sxs/prior treatment) Patient is a 76 y.o. male presenting with abdominal pain. The history is provided by the patient.  Abdominal Pain The primary symptoms of the illness include abdominal pain.  He is a somewhat vague and poor historian. He has been having generalized abdominal pain since yesterday and diarrhea for the last 2 days. He says his pain is somewhat better after he eats and worse if he doesn't eat. He denies nausea or vomiting and denies fever or chills. He says that he is both on peritoneal dialysis and hemodialysis and he has not noticed any problems with his peritoneal dialysis. Pain is mild and he rates it at 3/10.  Past Medical History  Diagnosis Date  . Hyperparathyroidism   . Arthritis   . Hyperlipidemia   . Hypertension   . Chronic kidney disease   . Neuromuscular disorder   . Stroke   . GERD (gastroesophageal reflux disease)   . Headache     occasional  migraine   . Heart attack 2011; 1990's  . Type II diabetes mellitus   . Peripheral neuropathy   . Anxiety   . Depression   . Dialysis patient     M, W, F; [address] (09/19/11)    Past Surgical History  Procedure Date  . Colon surgery 2011    colon resection   . Parathyroidectomy 2011  . Capd insertion 09/12/2011    Procedure: LAPAROSCOPIC INSERTION CONTINUOUS AMBULATORY PERITONEAL DIALYSIS  (CAPD) CATHETER;  Surgeon: Mariella Saa, MD;  Location: [facility] OR;  Service: General;  Laterality: N/A;  . Sp av dialysis shunt access existing *l* 2003  . Dg av dialysis shunt intro ndl*r* or     "not working" (09/19/11)    Family History  Problem Relation Age of Onset  . Cancer Sister     breast  . Cancer Brother     lung  . Anesthesia  problems Neg Hx   . Hypotension Neg Hx   . Malignant hyperthermia Neg Hx   . Pseudochol deficiency Neg Hx     History  Substance Use Topics  . Smoking status: Former Smoker -- 0.2 packs/day for 7 years    Types: Cigarettes    Quit date: 06/12/1976  . Smokeless tobacco: Never Used  . Alcohol Use: Yes     09/19/11 "occasionally; last alcohol 2011"      Review of Systems  Gastrointestinal: Positive for abdominal pain.  All other systems reviewed and are negative.    Allergies  Benazepril hcl  Home Medications   Current Outpatient Rx  Name Route Sig Dispense Refill  . AMLODIPINE BESYLATE 10 MG PO TABS Oral Take 10 mg by mouth daily.    . ASPIRIN 325 MG PO TABS Oral Take 1 tablet (325 mg total) by mouth daily. 30 tablet 0  . ATORVASTATIN CALCIUM 20 MG PO TABS Oral Take 1 tablet (20 mg total) by mouth daily at 6 PM. 30 tablet 0  . NEPHRO-VITE 0.8 MG PO TABS Oral Take 0.8 mg by mouth at bedtime.    Marland Kitchen CALCIUM CARBONATE ANTACID 500 MG PO CHEW Oral Chew 1 tablet by mouth 3 (three) times daily with meals.    Marland Kitchen HYDROXYZINE HCL 10 MG PO TABS Oral  Take 10 mg by mouth at bedtime as needed. For sleep    . LORATADINE 10 MG PO TABS Oral Take 10 mg by mouth daily.    Marland Kitchen METOPROLOL SUCCINATE ER 25 MG PO TB24 Oral Take 25 mg by mouth 2 (two) times daily.    Marland Kitchen PARICALCITOL 5 MCG/ML IV SOLN Intravenous Inject 1.4 mLs (7 mcg total) into the vein 3 (three) times a week. 1 mL     BP 150/57  Pulse 84  Temp(Src) 99.2 F (37.3 C) (Oral)  Resp 22  SpO2 93%  Physical Exam  Nursing note and vitals reviewed.  76 year old male is resting comfortably and in no acute distress. Vital signs are significant for mild tachypnea with respiratory rate of 22 and mild hypertension with blood pressure 150/57. Oxygen saturation is 93% which is normal. It should be noted that his temperature of 99.2 was technically normal but high for this time of day. Head is normocephalic and atraumatic. PERRLA, EOMI. Neck is  nontender and supple. Back is nontender. Lungs are clear without rales, wheezes, rhonchi. Heart has regular rate and rhythm without murmur. Abdomen: Peritoneal dialysis catheter is in place and site appears normal. Abdomen is soft with mild tenderness diffusely. There is no rebound or guarding and peristalsis is normal active. Extremities have trace edema, full range of motion is present. AV shunt is present in left forearm with strong thrill present. Skin is warm and dry without rash. Neurologic: Mental status is normal, cranial nerves are intact, there are no focal motor or sensory deficits.  ED Course  Procedures (including critical care time)  Results for orders placed during the hospital encounter of 11/17/11  CBC      Component Value Range   WBC 11.6 (*) 4.0 - 10.5 (K/uL)   RBC 3.46 (*) 4.22 - 5.81 (MIL/uL)   Hemoglobin 10.2 (*) 13.0 - 17.0 (g/dL)   HCT 16.1 (*) 09.6 - 52.0 (%)   MCV 88.4  78.0 - 100.0 (fL)   MCH 29.5  26.0 - 34.0 (pg)   MCHC 33.3  30.0 - 36.0 (g/dL)   RDW 04.5  40.9 - 81.1 (%)   Platelets 287  150 - 400 (K/uL)  DIFFERENTIAL      Component Value Range   Neutrophils Relative 85 (*) 43 - 77 (%)   Neutro Abs 9.8 (*) 1.7 - 7.7 (K/uL)   Lymphocytes Relative 4 (*) 12 - 46 (%)   Lymphs Abs 0.5 (*) 0.7 - 4.0 (K/uL)   Monocytes Relative 11  3 - 12 (%)   Monocytes Absolute 1.3 (*) 0.1 - 1.0 (K/uL)   Eosinophils Relative 0  0 - 5 (%)   Eosinophils Absolute 0.0  0.0 - 0.7 (K/uL)   Basophils Relative 0  0 - 1 (%)   Basophils Absolute 0.0  0.0 - 0.1 (K/uL)  COMPREHENSIVE METABOLIC PANEL      Component Value Range   Sodium 136  135 - 145 (mEq/L)   Potassium 3.5  3.5 - 5.1 (mEq/L)   Chloride 93 (*) 96 - 112 (mEq/L)   CO2 29  19 - 32 (mEq/L)   Glucose, Bld 136 (*) 70 - 99 (mg/dL)   BUN 33 (*) 6 - 23 (mg/dL)   Creatinine, Ser 9.14 (*) 0.50 - 1.35 (mg/dL)   Calcium 9.8  8.4 - 78.2 (mg/dL)   Total Protein 7.4  6.0 - 8.3 (g/dL)   Albumin 3.0 (*) 3.5 - 5.2 (g/dL)   AST 12  0  -  37 (U/L)   ALT 7  0 - 53 (U/L)   Alkaline Phosphatase 58  39 - 117 (U/L)   Total Bilirubin 0.5  0.3 - 1.2 (mg/dL)   GFR calc non Af Amer 7 (*) >90 (mL/min)   GFR calc Af Amer 8 (*) >90 (mL/min)  LIPASE, BLOOD      Component Value Range   Lipase 33  11 - 59 (U/L)  GRAM STAIN      Component Value Range   Specimen Description FLUID PERITONEAL     Special Requests 2.8CC     Gram Stain       Value: CYTOSPIN SLIDE:     WBC PRESENT,BOTH PMN AND MONONUCLEAR     GRAM POSITIVE COCCI IN CLUSTERS     CALLED TO Prosser, RN 11/17/11 0824 BY K SCHULTZ   Report Status 11/17/2011 FINAL      1. Bacterial peritonitis   2. End stage renal disease on dialysis       MDM  Diffuse abdominal pain in a patient on her peritoneal dialysis. Concern is present for bacterial peritonitis. I have access to his dialysis catheter but was only able to draw 3 mL of fluid fluid is cloudy and sent for culture and Gram stain. Old records were reviewed and it does noted that he was hospitalized in April at which time it appears that he had a number of WBCs in his dialysis the fluid but no bacteria.   Dialysis fluid today shows gram-positive cocci in clusters. Case is discussed with Dr. Bettina Gavia and he will be treated with antibiotics. He does not appear toxic today. He is given vancomycin intravenously and peritoneal dialysis nurse wall, and given dialysis fluid with in about ex as well. His scheduled dialysis is this morning and arrangements will be made for him to have his dialysis done this afternoon.     Dione Booze, MD 11/17/11 [phone]

## 2011-11-17 NOTE — ED Notes (Signed)
Pt agrees to allow iv team to stick him after speaking with dr Preston Fleeting, iv team paged and on the way to re stick pt.

## 2011-11-17 NOTE — ED Notes (Signed)
Pt refused a second iv team to stick him pt was stuck by this rn and 1 time by iv team

## 2011-11-17 NOTE — Progress Notes (Signed)
11/17/11  1230 flushing pd cath per dr Arlean Hopping request - with 1.5% dextrose 1 liter flushes x2.  Patient tolerating fair - some discomfort noted.  Fluid is milky/cloudy serosang - very little fluid to drain out (patient was dry) prior to the flushes.  The connections were also changed to new sterile connections in case there was a contaminate to the set the patient came in with.  Continue to monitor.  Mervin Hack rn

## 2011-11-19 LAB — BODY FLUID CULTURE

## 2011-11-20 NOTE — ED Notes (Signed)
Report called to Lequita Halt B according to [employer] report

## 2011-11-23 ENCOUNTER — Encounter (HOSPITAL_BASED_OUTPATIENT_CLINIC_OR_DEPARTMENT_OTHER): Payer: Medicare Other

## 2011-12-08 ENCOUNTER — Emergency Department (HOSPITAL_COMMUNITY): Payer: Medicare Other

## 2011-12-08 ENCOUNTER — Encounter (HOSPITAL_COMMUNITY): Payer: Self-pay | Admitting: Emergency Medicine

## 2011-12-08 DIAGNOSIS — R112 Nausea with vomiting, unspecified: Secondary | ICD-10-CM | POA: Insufficient documentation

## 2011-12-08 DIAGNOSIS — Z79899 Other long term (current) drug therapy: Secondary | ICD-10-CM | POA: Insufficient documentation

## 2011-12-08 DIAGNOSIS — Z87891 Personal history of nicotine dependence: Secondary | ICD-10-CM | POA: Insufficient documentation

## 2011-12-08 DIAGNOSIS — R109 Unspecified abdominal pain: Secondary | ICD-10-CM | POA: Insufficient documentation

## 2011-12-08 DIAGNOSIS — I129 Hypertensive chronic kidney disease with stage 1 through stage 4 chronic kidney disease, or unspecified chronic kidney disease: Secondary | ICD-10-CM | POA: Insufficient documentation

## 2011-12-08 DIAGNOSIS — R197 Diarrhea, unspecified: Secondary | ICD-10-CM | POA: Insufficient documentation

## 2011-12-08 DIAGNOSIS — E119 Type 2 diabetes mellitus without complications: Secondary | ICD-10-CM | POA: Insufficient documentation

## 2011-12-08 DIAGNOSIS — N189 Chronic kidney disease, unspecified: Secondary | ICD-10-CM | POA: Insufficient documentation

## 2011-12-08 DIAGNOSIS — K5289 Other specified noninfective gastroenteritis and colitis: Secondary | ICD-10-CM | POA: Insufficient documentation

## 2011-12-08 LAB — BASIC METABOLIC PANEL
BUN: 64 mg/dL — ABNORMAL HIGH (ref 6–23)
CO2: 23 mEq/L (ref 19–32)
Chloride: 93 mEq/L — ABNORMAL LOW (ref 96–112)
Creatinine, Ser: 12.28 mg/dL — ABNORMAL HIGH (ref 0.50–1.35)

## 2011-12-08 LAB — CBC WITH DIFFERENTIAL/PLATELET
Basophils Absolute: 0 10*3/uL (ref 0.0–0.1)
Eosinophils Relative: 1 % (ref 0–5)
HCT: 31.1 % — ABNORMAL LOW (ref 39.0–52.0)
Hemoglobin: 10.4 g/dL — ABNORMAL LOW (ref 13.0–17.0)
Lymphocytes Relative: 13 % (ref 12–46)
MCHC: 33.4 g/dL (ref 30.0–36.0)
MCV: 83.6 fL (ref 78.0–100.0)
Monocytes Absolute: 1.4 10*3/uL — ABNORMAL HIGH (ref 0.1–1.0)
Monocytes Relative: 24 % — ABNORMAL HIGH (ref 3–12)
RDW: 14.6 % (ref 11.5–15.5)
WBC: 5.6 10*3/uL (ref 4.0–10.5)

## 2011-12-08 NOTE — ED Notes (Signed)
Family updated personally 

## 2011-12-08 NOTE — ED Notes (Signed)
PT. REPORTS LOW ABDOMINAL PAIN WITH VOMITTING AND DIARRHEA ONSET 4 DAYS AGO , MISSED HIS HEMODIALYSIS LAST Wednesday AND TODAY. RESPIRATIONS UNLABORED.

## 2011-12-08 NOTE — ED Notes (Signed)
Family updated personally

## 2011-12-08 NOTE — ED Notes (Signed)
Updated in w/r 

## 2011-12-08 NOTE — ED Notes (Signed)
Family updated personally, directed to [facility] per request.

## 2011-12-09 ENCOUNTER — Emergency Department (HOSPITAL_COMMUNITY): Payer: Medicare Other

## 2011-12-09 ENCOUNTER — Emergency Department (HOSPITAL_COMMUNITY)
Admission: EM | Admit: 2011-12-09 | Discharge: 2011-12-09 | Disposition: A | Discharge status: Home / Self Care | Payer: Medicare Other | Attending: Emergency Medicine | Admitting: Emergency Medicine

## 2011-12-09 DIAGNOSIS — K529 Noninfective gastroenteritis and colitis, unspecified: Secondary | ICD-10-CM

## 2011-12-09 LAB — COMPREHENSIVE METABOLIC PANEL
AST: 19 U/L (ref 0–37)
BUN: 65 mg/dL — ABNORMAL HIGH (ref 6–23)
CO2: 22 mEq/L (ref 19–32)
Calcium: 10 mg/dL (ref 8.4–10.5)
Creatinine, Ser: 12.62 mg/dL — ABNORMAL HIGH (ref 0.50–1.35)
GFR calc Af Amer: 4 mL/min — ABNORMAL LOW (ref >90)
GFR calc non Af Amer: 3 mL/min — ABNORMAL LOW (ref >90)
Total Bilirubin: 0.4 mg/dL (ref 0.3–1.2)

## 2011-12-09 LAB — LIPASE, BLOOD: Lipase: 101 U/L — ABNORMAL HIGH (ref 11–59)

## 2011-12-09 MED ORDER — IOHEXOL 300 MG/ML  SOLN
20.0000 mL | INTRAMUSCULAR | Status: AC
  Administered 2011-12-09 (×2): 20 mL via ORAL

## 2011-12-09 MED ORDER — METRONIDAZOLE 500 MG PO TABS: 500.0000 mg | ORAL_TABLET | Freq: Two times a day (BID) | ORAL | Status: AC

## 2011-12-09 MED ORDER — CIPROFLOXACIN HCL 500 MG PO TABS: 500.0000 mg | ORAL_TABLET | Freq: Two times a day (BID) | ORAL | Status: AC

## 2011-12-09 MED ORDER — TRAMADOL HCL 50 MG PO TABS: 50.0000 mg | ORAL_TABLET | Freq: Four times a day (QID) | ORAL | Status: AC | PRN

## 2011-12-09 NOTE — ED Notes (Signed)
Patient transported to CT 

## 2011-12-09 NOTE — ED Notes (Signed)
Informed CT that patient has finished drinking oral contrast

## 2011-12-09 NOTE — ED Notes (Signed)
Patient does dialysis M-W-F. Has not had dialysis since Monday d/t abd pain.

## 2011-12-09 NOTE — ED Notes (Signed)
Phoned patient's daughter and informed him of discharge status. She is on the way to come and pick patient up

## 2011-12-09 NOTE — ED Provider Notes (Addendum)
History     CSN: [id number]  Arrival date & time 12/08/11  4098   First MD Initiated Contact with Patient 12/09/11 0113      Chief Complaint  Patient presents with  . Abdominal Pain    (Consider location/radiation/quality/duration/timing/severity/associated sxs/prior treatment) HPI Pt is extremely poor historian. States he was given some medicine by his Rn and now is having abdominal pain, diarrhea and vomiting. No fever, chills. Pt does not know what the medicine he was given is. He has missed his last 2 peritoneal dialysis.  Past Medical History  Diagnosis Date  . Hyperparathyroidism   . Arthritis   . Hyperlipidemia   . Hypertension   . Chronic kidney disease   . Neuromuscular disorder   . Stroke   . GERD (gastroesophageal reflux disease)   . Headache     occasional  migraine   . Heart attack 2011; 1990's  . Type II diabetes mellitus   . Peripheral neuropathy   . Anxiety   . Depression   . Dialysis patient     M, W, F; [address] (09/19/11)    Past Surgical History  Procedure Date  . Colon surgery 2011    colon resection   . Parathyroidectomy 2011  . Capd insertion 09/12/2011    Procedure: LAPAROSCOPIC INSERTION CONTINUOUS AMBULATORY PERITONEAL DIALYSIS  (CAPD) CATHETER;  Surgeon: Mariella Saa, MD;  Location: [facility] OR;  Service: General;  Laterality: N/A;  . Sp av dialysis shunt access existing *l* 2003  . Dg av dialysis shunt intro ndl*r* or     "not working" (09/19/11)    Family History  Problem Relation Age of Onset  . Cancer Sister     breast  . Cancer Brother     lung  . Anesthesia problems Neg Hx   . Hypotension Neg Hx   . Malignant hyperthermia Neg Hx   . Pseudochol deficiency Neg Hx     History  Substance Use Topics  . Smoking status: Former Smoker -- 0.2 packs/day for 7 years    Types: Cigarettes    Quit date: 06/12/1976  . Smokeless tobacco: Never Used  . Alcohol Use: Yes     09/19/11 "occasionally; last alcohol 2011"       Review of Systems  Constitutional: Negative for fever and chills.  Respiratory: Negative for shortness of breath.   Cardiovascular: Negative for chest pain and leg swelling.  Gastrointestinal: Positive for nausea, vomiting, abdominal pain and diarrhea.  Musculoskeletal: Negative for back pain.  Skin: Negative for rash and wound.  Neurological: Negative for weakness, light-headedness, numbness and headaches.    Allergies  Benazepril hcl  Home Medications   Current Outpatient Rx  Name Route Sig Dispense Refill  . AMLODIPINE BESYLATE 10 MG PO TABS Oral Take 10 mg by mouth daily.    . ASPIRIN 325 MG PO TABS Oral Take 325 mg by mouth every evening.    . ATORVASTATIN CALCIUM 20 MG PO TABS Oral Take 1 tablet (20 mg total) by mouth daily at 6 PM. 30 tablet 0  . NEPHRO-VITE 0.8 MG PO TABS Oral Take 0.8 mg by mouth at bedtime.    Marland Kitchen CALCIUM CARBONATE ANTACID 500 MG PO CHEW Oral Chew 1 tablet by mouth 3 (three) times daily with meals.    Marland Kitchen HYDROXYZINE HCL 10 MG PO TABS Oral Take 10 mg by mouth at bedtime as needed. For sleep    . LORATADINE 10 MG PO TABS Oral Take 10 mg by mouth  daily.    Marland Kitchen METOPROLOL TARTRATE 25 MG PO TABS Oral Take 25 mg by mouth 2 (two) times daily.    Marland Kitchen PARICALCITOL 5 MCG/ML IV SOLN Intravenous Inject 7 mcg into the vein 3 (three) times a week. Given on Mon, Wed & Fri at dialysis    . CIPROFLOXACIN HCL 500 MG PO TABS Oral Take 1 tablet (500 mg total) by mouth 2 (two) times daily. 20 tablet 0  . METRONIDAZOLE 500 MG PO TABS Oral Take 1 tablet (500 mg total) by mouth 2 (two) times daily. 20 tablet 0  . TRAMADOL HCL 50 MG PO TABS Oral Take 1 tablet (50 mg total) by mouth every 6 (six) hours as needed for pain. 15 tablet 0    BP 157/69  Pulse 76  Temp 98.3 F (36.8 C) (Oral)  Resp 15  SpO2 96%  Physical Exam  Nursing note and vitals reviewed. Constitutional: He is oriented to person, place, and time. He appears well-developed and well-nourished. No distress.   HENT:  Head: Normocephalic and atraumatic.  Mouth/Throat: Oropharynx is clear and moist.  Eyes: EOM are normal. Pupils are equal, round, and reactive to light.  Neck: Normal range of motion. Neck supple.  Cardiovascular: Normal rate and regular rhythm.   Pulmonary/Chest: Effort normal and breath sounds normal. No respiratory distress. He has no wheezes. He has no rales.  Abdominal: Soft. Bowel sounds are normal. He exhibits distension. He exhibits no mass. There is tenderness (LLQ TTP, + volutary guarding). There is no rebound.       PD cath in place w/o evidence of infection  Musculoskeletal: Normal range of motion. He exhibits no edema and no tenderness.  Neurological: He is alert and oriented to person, place, and time.       5/5 motor, sensation intact  Skin: Skin is warm and dry. No rash noted. No erythema.  Psychiatric: He has a normal mood and affect. His behavior is normal.    ED Course  Procedures (including critical care time)  Labs Reviewed  BASIC METABOLIC PANEL - Abnormal; Notable for the following:    Potassium 5.2 (*)     Chloride 93 (*)     BUN 64 (*)     Creatinine, Ser 12.28 (*)     GFR calc non Af Amer 3 (*)     GFR calc Af Amer 4 (*)     All other components within normal limits  CBC WITH DIFFERENTIAL - Abnormal; Notable for the following:    RBC 3.72 (*)     Hemoglobin 10.4 (*)     HCT 31.1 (*)     Monocytes Relative 24 (*)     Monocytes Absolute 1.4 (*)     All other components within normal limits  COMPREHENSIVE METABOLIC PANEL - Abnormal; Notable for the following:    Chloride 92 (*)     BUN 65 (*)     Creatinine, Ser 12.62 (*)     Total Protein 8.7 (*)     Albumin 3.3 (*)     GFR calc non Af Amer 3 (*)     GFR calc Af Amer 4 (*)     All other components within normal limits  LIPASE, BLOOD - Abnormal; Notable for the following:    Lipase 101 (*)     All other components within normal limits   Ct Abdomen Pelvis Wo Contrast  12/09/2011   *RADIOLOGY REPORT*  Clinical Data: Lower abdominal pain  CT ABDOMEN AND  PELVIS WITHOUT CONTRAST  Technique:  Multidetector CT imaging of the abdomen and pelvis was performed following the standard protocol without intravenous contrast.  Comparison: 09/19/2011  Findings: Coronary artery calcification.  Aortic valve calcification.  Linear left lung base opacity is likely scarring or atelectasis.  Organ abnormality/lesion detection is limited in the absence of intravenous contrast. Within this limitation, unremarkable liver, pancreas, adrenal glands.  Spleen is upper normal to mildly enlarged.  Layering gallstones.  No biliary ductal dilatation.  Bilateral renal cysts and hypodensities are incompletely characterized. No hydronephrosis or hydroureter.  No bowel obstruction.  There are loops of small bowel within the anterior lower abdomen/ upper pelvis with wall thickening and marked perienteric inflammation.  These loops are closely adherent to the anterior abdominal wall and in proximity to a fat containing incisional hernia.  There is a right anterior abdominal approach catheter with tip coiled in the pelvis.  No loculated fluid collection.  No free intraperitoneal air. Contrast is present within the colon.  There is a mild thickened appearance to the sigmoid colon however this is nonspecific given incomplete distension.  Advanced atherosclerotic disease of the aorta and branch vessels.  Decompressed bladder.  Prostatomegaly.  Advanced bilateral hip DJD.  Bilateral SI joint ankylosis. Multilevel degenerative changes and findings suggestive of renal osteodystrophy.  IMPRESSION: Marked stranding of the mesenteric fat within the anterior lower abdomen/upper pelvis, surrounding thickened small bowel loops and portions of the peritoneal catheter.  This may reflect a nonspecific enteritis; including ischemia, infection, and inflammatory etiologies. There is no loculated/drainable fluid collection.  The inflammatory process  is inseparable from a fat containing paraincisional hernia with associated stranding of the herniated fat.  This may reflect incarceration.  Incompletely characterized renal hypodensities.  Consider MRI or ultrasound follow-up.  Original Report Authenticated By: Waneta Martins, M.D.   Dg Chest 2 View  12/08/2011  *RADIOLOGY REPORT*  Clinical Data: Left lower abdominal pain, peritoneal device inserted 2 months ago, history chronic kidney disease, diabetes, hypertension, cough, coronary artery disease post MI  CHEST - 2 VIEW  Comparison: 09/19/2011  Findings: Enlargement of cardiac silhouette. Calcified tortuous aorta. Slight pulmonary vascular congestion. Bibasilar atelectasis. Lungs otherwise clear. No pleural effusion or pneumothorax. Bones unremarkable.  IMPRESSION: Minimal enlargement cardiac silhouette with pulmonary vascular congestion. Bibasilar atelectasis.  Original Report Authenticated By: Lollie Marrow, M.D.     1. Enteritis     Date: 03/16/2012  Rate: 75  Rhythm: normal sinus rhythm  QRS Axis: left axis deviation  Intervals: normal  ST/T Wave abnormalities: normal  Conduction Disutrbances:none  Narrative Interpretation:   Old EKG Reviewed: none available     MDM   Pt continues to look very well. Resting comfortably. VS stable throughout night. Has had several episodes of diarrhea. Normal WBC. Given symptoms, exam and CT abd, think pt has enteritis which is likely infectious. Will treat with abx and have f/u with PMD and with nephrologist for dialysis       Loren Racer, MD 12/09/11 1191  Loren Racer, MD 03/16/12 1311

## 2011-12-09 NOTE — ED Notes (Signed)
Patient is drinking oral contrast.

## 2011-12-09 NOTE — ED Notes (Signed)
Daughter left and requests to be contacted with updates/changes to plan of care. S. Providence Lanius, daughter, [phone]

## 2011-12-25 ENCOUNTER — Encounter (INDEPENDENT_AMBULATORY_CARE_PROVIDER_SITE_OTHER): Payer: Self-pay

## 2011-12-25 ENCOUNTER — Other Ambulatory Visit (HOSPITAL_COMMUNITY): Payer: Self-pay | Admitting: Nephrology

## 2011-12-25 DIAGNOSIS — N186 End stage renal disease: Secondary | ICD-10-CM

## 2012-01-01 ENCOUNTER — Telehealth (INDEPENDENT_AMBULATORY_CARE_PROVIDER_SITE_OTHER): Payer: Self-pay

## 2012-01-01 NOTE — Telephone Encounter (Signed)
The pa called to get this patient in soon for a pd catheter removal.  Dr Johna Sheriff put it in.  She was told his schedule goes out to September so she was transferred to triage.  I told her I will get a message to Dr Johna Sheriff and the nurse working with him this pm so they can work the pt in sooner.

## 2012-01-02 ENCOUNTER — Ambulatory Visit (HOSPITAL_COMMUNITY): Payer: Medicare Other

## 2012-01-09 ENCOUNTER — Ambulatory Visit (HOSPITAL_COMMUNITY): Admission: RE | Admit: 2012-01-09 | Payer: Medicare Other | Source: Ambulatory Visit

## 2012-01-25 ENCOUNTER — Ambulatory Visit (INDEPENDENT_AMBULATORY_CARE_PROVIDER_SITE_OTHER): Payer: Medicare Other | Admitting: General Surgery

## 2012-02-15 ENCOUNTER — Ambulatory Visit (INDEPENDENT_AMBULATORY_CARE_PROVIDER_SITE_OTHER): Payer: Medicare Other | Admitting: General Surgery

## 2012-02-15 ENCOUNTER — Encounter (INDEPENDENT_AMBULATORY_CARE_PROVIDER_SITE_OTHER): Payer: Self-pay | Admitting: General Surgery

## 2012-02-15 VITALS — BP 140/84 | HR 82 | Temp 97.0°F | Resp 18 | Ht 65.0 in | Wt 213.4 lb

## 2012-02-15 DIAGNOSIS — T8571XA Infection and inflammatory reaction due to peritoneal dialysis catheter, initial encounter: Secondary | ICD-10-CM

## 2012-02-15 NOTE — Progress Notes (Signed)
Chief complaint: Peritoneal dialysis catheter, nonfunctioning, peritonitis  History: Patient returns to the office status post laparoscopic placement of peritoneal dialysis catheter in April. I was not aware but apparently he had 2 episodes of peritonitis in June and had a lot of pain with the flushes following that and peritoneal dialysis has been stopped. He has been back on hemodialysis. He is here today for request of Dr. Coldonado to discuss removal of his peritoneal dialysis catheter. It has not been used for at least a couple of months. He currently does not have abdominal pain or symptoms of infection.  Past Medical History  Diagnosis Date  . Hyperparathyroidism   . Arthritis   . Hyperlipidemia   . Hypertension   . Chronic kidney disease   . Neuromuscular disorder   . Stroke   . GERD (gastroesophageal reflux disease)   . Headache     occasional  migraine   . Heart attack 2011; 1990's  . Type II diabetes mellitus   . Peripheral neuropathy   . Anxiety   . Depression   . Dialysis patient     M, W, F; [address] (09/19/11)   Past Surgical History  Procedure Date  . Colon surgery 2011    colon resection   . Parathyroidectomy 2011  . Capd insertion 09/12/2011    Procedure: LAPAROSCOPIC INSERTION CONTINUOUS AMBULATORY PERITONEAL DIALYSIS  (CAPD) CATHETER;  Surgeon: Tailer Volkert T Vedder Brittian, MD;  Location: [facility] OR;  Service: General;  Laterality: N/A;  . Sp av dialysis shunt access existing *l* 2003  . Dg av dialysis shunt intro ndl*r* or     "not working" (09/19/11)   Current Outpatient Prescriptions  Medication Sig Dispense Refill  . amLODipine (NORVASC) 10 MG tablet Take 10 mg by mouth daily.      . aspirin 325 MG tablet Take 325 mg by mouth every evening.      . atorvastatin (LIPITOR) 20 MG tablet Take 1 tablet (20 mg total) by mouth daily at 6 PM.  30 tablet  0  . b complex-vitamin c-folic acid (NEPHRO-VITE) 0.8 MG TABS Take 0.8 mg by mouth at bedtime.      . calcium  carbonate (TUMS - DOSED IN MG ELEMENTAL CALCIUM) 500 MG chewable tablet Chew 1 tablet by mouth 3 (three) times daily with meals.      . hydrOXYzine (ATARAX/VISTARIL) 10 MG tablet Take 10 mg by mouth at bedtime as needed. For sleep      . loratadine (CLARITIN) 10 MG tablet Take 10 mg by mouth daily.      . metoprolol tartrate (LOPRESSOR) 25 MG tablet Take 25 mg by mouth 2 (two) times daily.      . paricalcitol (ZEMPLAR) 5 MCG/ML injection Inject 7 mcg into the vein 3 (three) times a week. Given on Mon, Wed & Fri at dialysis       Allergies  Allergen Reactions  . Benazepril Hcl Hives   Exam: BP 140/84  Pulse 82  Temp 97 F (36.1 C) (Temporal)  Resp 18  Ht 5' 5" (1.651 m)  Wt 213 lb 6.4 oz (96.798 kg)  BMI 35.51 kg/m2  SpO2 100% General: Obese African American male who does not appear ill Skin: No rash or infection HEENT: No scleral icterus. No masses. Lungs: Clear equal breath sounds without increased work of breathing Cardiac: Regular rate and rhythm. No peripheral edema. Abdomen: Multiple healed incisions. Peritoneal abscess The right lower quadrant. Exit site looks okay. Mild tenderness at the insertion site.   Extremities: No joint swelling or edema Neurologic: He is alert and oriented. Gait normal.  Assessment and plan: Peritoneal dialysis catheter, nonfunctioning, history of peritonitis. I recommend going ahead with removal under general anesthesia as an outpatient on nondialysis day. We discussed risks of surgery including anesthetic complications and bleeding and infection. He understands and agrees. 

## 2012-02-19 ENCOUNTER — Encounter (HOSPITAL_COMMUNITY): Payer: Self-pay | Admitting: *Deleted

## 2012-02-19 MED ORDER — CEFAZOLIN SODIUM-DEXTROSE 2-3 GM-% IV SOLR: 2.0000 g | INTRAVENOUS | Status: DC

## 2012-02-19 NOTE — Progress Notes (Signed)
I called patient for pre op interview, patient said that he is not having surgery because he does not have anyone to bring him or stay with him after surgery. He said he definitely not coming. "I want it out I just can't come tomorrow." I called Dr Janee Morn, who was on call for DR [facility] and he said he would text Dr Johna Sheriff.  I notified Angie in the OR that patient said he is not having surgery tomorrow.

## 2012-02-20 ENCOUNTER — Emergency Department (HOSPITAL_COMMUNITY): Payer: Medicare Other

## 2012-02-20 ENCOUNTER — Telehealth (INDEPENDENT_AMBULATORY_CARE_PROVIDER_SITE_OTHER): Payer: Self-pay

## 2012-02-20 ENCOUNTER — Emergency Department (HOSPITAL_COMMUNITY)
Admission: EM | Admit: 2012-02-20 | Discharge: 2012-02-20 | Disposition: A | Discharge status: Home / Self Care | Payer: Medicare Other | Attending: Emergency Medicine | Admitting: Emergency Medicine

## 2012-02-20 ENCOUNTER — Ambulatory Visit (HOSPITAL_COMMUNITY): Admission: RE | Admit: 2012-02-20 | Payer: Medicare Other | Source: Ambulatory Visit | Admitting: General Surgery

## 2012-02-20 ENCOUNTER — Encounter (HOSPITAL_COMMUNITY): Admission: RE | Payer: Self-pay | Source: Ambulatory Visit

## 2012-02-20 DIAGNOSIS — N186 End stage renal disease: Secondary | ICD-10-CM | POA: Insufficient documentation

## 2012-02-20 DIAGNOSIS — J329 Chronic sinusitis, unspecified: Secondary | ICD-10-CM | POA: Insufficient documentation

## 2012-02-20 DIAGNOSIS — I120 Hypertensive chronic kidney disease with stage 5 chronic kidney disease or end stage renal disease: Secondary | ICD-10-CM | POA: Insufficient documentation

## 2012-02-20 DIAGNOSIS — I10 Essential (primary) hypertension: Secondary | ICD-10-CM

## 2012-02-20 DIAGNOSIS — R0602 Shortness of breath: Secondary | ICD-10-CM | POA: Insufficient documentation

## 2012-02-20 DIAGNOSIS — Z992 Dependence on renal dialysis: Secondary | ICD-10-CM | POA: Insufficient documentation

## 2012-02-20 LAB — CBC WITH DIFFERENTIAL/PLATELET
Hemoglobin: 10.6 g/dL — ABNORMAL LOW (ref 13.0–17.0)
Lymphocytes Relative: 11 % — ABNORMAL LOW (ref 12–46)
Lymphs Abs: 0.5 10*3/uL — ABNORMAL LOW (ref 0.7–4.0)
Monocytes Relative: 15 % — ABNORMAL HIGH (ref 3–12)
Neutro Abs: 3.3 10*3/uL (ref 1.7–7.7)
Neutrophils Relative %: 73 % (ref 43–77)
RBC: 3.95 MIL/uL — ABNORMAL LOW (ref 4.22–5.81)

## 2012-02-20 LAB — POCT I-STAT TROPONIN I: Troponin i, poc: 0.04 ng/mL (ref 0.00–0.08)

## 2012-02-20 LAB — COMPREHENSIVE METABOLIC PANEL
Alkaline Phosphatase: 78 U/L (ref 39–117)
BUN: 35 mg/dL — ABNORMAL HIGH (ref 6–23)
Chloride: 96 mEq/L (ref 96–112)
GFR calc Af Amer: 8 mL/min — ABNORMAL LOW (ref >90)
Glucose, Bld: 162 mg/dL — ABNORMAL HIGH (ref 70–99)
Potassium: 3.7 mEq/L (ref 3.5–5.1)
Total Bilirubin: 0.3 mg/dL (ref 0.3–1.2)

## 2012-02-20 LAB — POCT I-STAT, CHEM 8
Calcium, Ion: 1.16 mmol/L (ref 1.13–1.30)
Creatinine, Ser: 7 mg/dL — ABNORMAL HIGH (ref 0.50–1.35)
Hemoglobin: 11.9 g/dL — ABNORMAL LOW (ref 13.0–17.0)
Sodium: 140 mEq/L (ref 135–145)
TCO2: 27 mmol/L (ref 0–100)

## 2012-02-20 SURGERY — CONTINUOUS AMBULATORY PERITONEAL DIALYSIS  (CAPD) CATHETER REMOVAL
Anesthesia: General

## 2012-02-20 MED ORDER — KETOROLAC TROMETHAMINE 30 MG/ML IJ SOLN
30.0000 mg | Freq: Once | INTRAMUSCULAR | Status: AC
  Administered 2012-02-20: 30 mg via INTRAVENOUS
  Filled 2012-02-20: qty 1

## 2012-02-20 MED ORDER — ONDANSETRON HCL 4 MG/2ML IJ SOLN
4.0000 mg | Freq: Once | INTRAMUSCULAR | Status: AC
  Administered 2012-02-20: 4 mg via INTRAVENOUS
  Filled 2012-02-20: qty 2

## 2012-02-20 MED ORDER — HYDRALAZINE HCL 20 MG/ML IJ SOLN
10.0000 mg | Freq: Once | INTRAMUSCULAR | Status: AC
  Administered 2012-02-20: 10 mg via INTRAVENOUS
  Filled 2012-02-20: qty 1

## 2012-02-20 NOTE — ED Notes (Signed)
Pt in from home via [facility], pt c/o HA x 4 days, pt onset Nausea today @ 11:00, pt unable to state how many episodes of vomiting today, pt denies diarrhea, pt hx  Of HTN, pt denies taking BP meds today, pt A&O x4, follows commands, speaks in complete sentences

## 2012-02-20 NOTE — Telephone Encounter (Signed)
LMOM with the f/u appt with Dr Johna Sheriff for 10-3.

## 2012-02-20 NOTE — ED Provider Notes (Signed)
History     CSN: [id number]  Arrival date & time 02/20/12  2238   First MD Initiated Contact with Patient 02/20/12 2256      Chief Complaint  Patient presents with  . Nausea    (Consider location/radiation/quality/duration/timing/severity/associated sxs/prior treatment) HPI Comments: 76 year old male with a history of hypertension, end-stage renal disease on dialysis, myocardial infarction and diabetes with a recent stroke that affected his speech area he states that over the last several days he has developed a headache which is generalized, constant and associated with nausea but today also developed an episode of chills followed by vomiting. He has vomited multiple episodes today but has not had any diarrhea. He does not make urine, has not had any abdominal pain (has peritoneal dialysis catheter which is supposed to come out). He also has been coughing frequently over the last several weeks. He denies any fevers, rashes, swelling or changes in his vision acutely.  The history is provided by the patient, the EMS personnel and medical records.    Past Medical History  Diagnosis Date  . Hyperparathyroidism   . Arthritis   . Hyperlipidemia   . Hypertension   . Neuromuscular disorder   . Stroke   . GERD (gastroesophageal reflux disease)   . Headache     occasional  migraine   . Heart attack 2011; 1990's  . Type II diabetes mellitus   . Peripheral neuropathy   . Anxiety   . Depression   . Dialysis patient     M, W, F; [address] (09/19/11)  . Chronic kidney disease     [facility] MWF    Past Surgical History  Procedure Date  . Colon surgery 2011    colon resection   . Parathyroidectomy 2011  . Capd insertion 09/12/2011    Procedure: LAPAROSCOPIC INSERTION CONTINUOUS AMBULATORY PERITONEAL DIALYSIS  (CAPD) CATHETER;  Surgeon: Mariella Saa, MD;  Location: [facility] OR;  Service: General;  Laterality: N/A;  . Sp av dialysis shunt access existing *l* 2003  . Dg av dialysis  shunt intro ndl*r* or     "not working" (09/19/11)    Family History  Problem Relation Age of Onset  . Cancer Sister     breast  . Cancer Brother     lung  . Anesthesia problems Neg Hx   . Hypotension Neg Hx   . Malignant hyperthermia Neg Hx   . Pseudochol deficiency Neg Hx     History  Substance Use Topics  . Smoking status: Former Smoker -- 0.2 packs/day for 7 years    Types: Cigarettes    Quit date: 06/12/1976  . Smokeless tobacco: Never Used  . Alcohol Use: Yes     09/19/11 "occasionally; last alcohol 2011"      Review of Systems  All other systems reviewed and are negative.    Allergies  Benazepril hcl  Home Medications   Current Outpatient Rx  Name Route Sig Dispense Refill  . AMLODIPINE BESYLATE 10 MG PO TABS Oral Take 10 mg by mouth daily.    . ASPIRIN 325 MG PO TABS Oral Take 325 mg by mouth every evening.    . ATORVASTATIN CALCIUM 20 MG PO TABS Oral Take 1 tablet (20 mg total) by mouth daily at 6 PM. 30 tablet 0  . NEPHRO-VITE 0.8 MG PO TABS Oral Take 0.8 mg by mouth at bedtime.    Marland Kitchen CALCIUM CARBONATE ANTACID 500 MG PO CHEW Oral Chew 1 tablet by mouth 3 (three)  times daily with meals.    Marland Kitchen HYDROXYZINE HCL 10 MG PO TABS Oral Take 10 mg by mouth at bedtime as needed. For sleep    . LEVOFLOXACIN 500 MG PO TABS Oral Take 1 tablet (500 mg total) by mouth daily. 7 tablet 0  . LORATADINE 10 MG PO TABS Oral Take 10 mg by mouth daily.    Marland Kitchen METOPROLOL TARTRATE 25 MG PO TABS Oral Take 25 mg by mouth 2 (two) times daily.    Marland Kitchen NAPROXEN 500 MG PO TABS Oral Take 1 tablet (500 mg total) by mouth 2 (two) times daily with a meal. 30 tablet 0  . PARICALCITOL 5 MCG/ML IV SOLN Intravenous Inject 7 mcg into the vein 3 (three) times a week. Given on Mon, Wed & Fri at dialysis      BP 181/69  Pulse 69  Temp 97.7 F (36.5 C) (Rectal)  Resp 16  SpO2 95%  Physical Exam  Nursing note and vitals reviewed. Constitutional: He appears well-developed and well-nourished. No  distress.  HENT:  Head: Normocephalic and atraumatic.  Mouth/Throat: Oropharynx is clear and moist. No oropharyngeal exudate.  Eyes: Conjunctivae and EOM are normal. Right eye exhibits no discharge. Left eye exhibits no discharge. No scleral icterus.       Right pupil abnormally-shaped, status post surgery  Neck: Normal range of motion. Neck supple. No JVD present. No thyromegaly present.  Cardiovascular: Normal rate, regular rhythm, normal heart sounds and intact distal pulses.  Exam reveals no gallop and no friction rub.   No murmur heard. Pulmonary/Chest: Effort normal and breath sounds normal. No respiratory distress. He has no wheezes. He has no rales.  Abdominal: Soft. Bowel sounds are normal. He exhibits no distension and no mass. There is no tenderness.       Dialysis catheter present, no tenderness in the abdomen, no guarding, no peritoneal signs  Musculoskeletal: Normal range of motion. He exhibits no edema and no tenderness.  Lymphadenopathy:    He has no cervical adenopathy.  Neurological: He is alert. Coordination normal.       Slight occasional stuttering speech, otherwise speech is clear, movements her purse ice without limb ataxia, strength is normal in all 4 extremities, memory is intact and the patient is alert and oriented to place and date  Skin: Skin is warm and dry. No rash noted. No erythema.  Psychiatric: He has a normal mood and affect. His behavior is normal.    ED Course  Procedures (including critical care time)  Labs Reviewed  CBC WITH DIFFERENTIAL - Abnormal; Notable for the following:    RBC 3.95 (*)     Hemoglobin 10.6 (*)     HCT 33.2 (*)     Lymphocytes Relative 11 (*)     Lymphs Abs 0.5 (*)     Monocytes Relative 15 (*)     All other components within normal limits  COMPREHENSIVE METABOLIC PANEL - Abnormal; Notable for the following:    Glucose, Bld 162 (*)     BUN 35 (*)     Creatinine, Ser 7.19 (*)     GFR calc non Af Amer 7 (*)     GFR calc  Af Amer 8 (*)     All other components within normal limits  POCT I-STAT, CHEM 8 - Abnormal; Notable for the following:    BUN 36 (*)     Creatinine, Ser 7.00 (*)     Glucose, Bld 160 (*)     Hemoglobin  11.9 (*)     HCT 35.0 (*)     All other components within normal limits  POCT I-STAT TROPONIN I   Dg Chest 2 View  02/20/2012  *RADIOLOGY REPORT*  Clinical Data: Shortness of breath.  CHEST - 2 VIEW  Comparison: 12/08/2011.  Findings: Poor inspiration.  No gross change in mild enlargement of the cardiac silhouette.  The aorta is tortuous.  No significant change in prominence of the pulmonary vasculature and interstitial markings.  No pleural fluid.  Thoracic spine degenerative changes.  IMPRESSION: Stable cardiomegaly, pulmonary vascular congestion and chronic interstitial lung disease.  No acute abnormality.   Original Report Authenticated By: Darrol Angel, M.D.      1. Sinus infection   2. Hypertension       MDM  At this time the patient is severely hypertensive at 219/86, he has ongoing headache with nausea, no other focal neurologic findings other than prior speech difficulties, check EKG, labs, medications for headache and nausea, chest x-ray to evaluate for his ongoing cough with chills this morning. Rectal temperature.  EKG interpretation 02/20/2012 11:24 PM Rate 71 Rhythm normal sinus  axis normal intervals: Intraventricular conduction delay, first degree AV block No left ventricular hypertrophy ST segments and T waves normal appearing Poor R-wave progression  Pt now has BP of 160 / 77, has no fever and feels better after meds.  Has not taken BP meds tonight - start Levaquin - states that the vomiting is actually bringing up phlegm from his throat - OP clear, has some nasal drainage - possible sinusitis with headache.  Discharge Prescriptions include:  Levaquin      Vida Roller, MD 02/21/12 0021

## 2012-02-21 MED ORDER — LEVOFLOXACIN 500 MG PO TABS: 500.0000 mg | ORAL_TABLET | Freq: Every day | ORAL | Status: AC

## 2012-02-21 MED ORDER — NAPROXEN 500 MG PO TABS: 500.0000 mg | ORAL_TABLET | Freq: Two times a day (BID) | ORAL | Status: AC

## 2012-02-21 MED ORDER — OXYCODONE-ACETAMINOPHEN 5-325 MG PO TABS
2.0000 | ORAL_TABLET | Freq: Once | ORAL | Status: DC
  Filled 2012-02-21: qty 2

## 2012-02-22 ENCOUNTER — Encounter (INDEPENDENT_AMBULATORY_CARE_PROVIDER_SITE_OTHER): Payer: Medicare Other | Admitting: General Surgery

## 2012-03-11 NOTE — Progress Notes (Signed)
Per pt his son or daughter will help him with surgery and are the people to talk to; son states to call his sister. LM for pt's daughter with arrival time, NPO status, meds to take.

## 2012-03-12 ENCOUNTER — Encounter (HOSPITAL_COMMUNITY)
Admission: RE | Disposition: A | Discharge status: Home / Self Care | Payer: Self-pay | Source: Ambulatory Visit | Attending: General Surgery

## 2012-03-12 ENCOUNTER — Ambulatory Visit (HOSPITAL_COMMUNITY)
Admission: RE | Admit: 2012-03-12 | Discharge: 2012-03-12 | Disposition: A | Discharge status: Home / Self Care | Payer: Medicare Other | Source: Ambulatory Visit | Attending: General Surgery | Admitting: General Surgery

## 2012-03-12 ENCOUNTER — Encounter (HOSPITAL_COMMUNITY): Payer: Self-pay | Admitting: *Deleted

## 2012-03-12 ENCOUNTER — Encounter (HOSPITAL_COMMUNITY): Payer: Self-pay | Admitting: Anesthesiology

## 2012-03-12 ENCOUNTER — Ambulatory Visit (HOSPITAL_COMMUNITY): Payer: Medicare Other | Admitting: Anesthesiology

## 2012-03-12 DIAGNOSIS — N186 End stage renal disease: Secondary | ICD-10-CM | POA: Insufficient documentation

## 2012-03-12 DIAGNOSIS — E1142 Type 2 diabetes mellitus with diabetic polyneuropathy: Secondary | ICD-10-CM | POA: Insufficient documentation

## 2012-03-12 DIAGNOSIS — E785 Hyperlipidemia, unspecified: Secondary | ICD-10-CM | POA: Insufficient documentation

## 2012-03-12 DIAGNOSIS — Z992 Dependence on renal dialysis: Secondary | ICD-10-CM | POA: Insufficient documentation

## 2012-03-12 DIAGNOSIS — Y849 Medical procedure, unspecified as the cause of abnormal reaction of the patient, or of later complication, without mention of misadventure at the time of the procedure: Secondary | ICD-10-CM | POA: Insufficient documentation

## 2012-03-12 DIAGNOSIS — N2581 Secondary hyperparathyroidism of renal origin: Secondary | ICD-10-CM | POA: Insufficient documentation

## 2012-03-12 DIAGNOSIS — T82898A Other specified complication of vascular prosthetic devices, implants and grafts, initial encounter: Secondary | ICD-10-CM

## 2012-03-12 DIAGNOSIS — E1149 Type 2 diabetes mellitus with other diabetic neurological complication: Secondary | ICD-10-CM | POA: Insufficient documentation

## 2012-03-12 DIAGNOSIS — I120 Hypertensive chronic kidney disease with stage 5 chronic kidney disease or end stage renal disease: Secondary | ICD-10-CM | POA: Insufficient documentation

## 2012-03-12 HISTORY — PX: CAPD REMOVAL: SHX5234

## 2012-03-12 LAB — POCT I-STAT 4, (NA,K, GLUC, HGB,HCT)
HCT: 44 % (ref 39.0–52.0)
Hemoglobin: 15 g/dL (ref 13.0–17.0)

## 2012-03-12 LAB — GLUCOSE, CAPILLARY

## 2012-03-12 SURGERY — CONTINUOUS AMBULATORY PERITONEAL DIALYSIS  (CAPD) CATHETER REMOVAL
Anesthesia: General | Site: Abdomen | Wound class: Clean

## 2012-03-12 MED ORDER — 0.9 % SODIUM CHLORIDE (POUR BTL) OPTIME
TOPICAL | Status: DC | PRN
  Administered 2012-03-12: 1000 mL

## 2012-03-12 MED ORDER — MEPERIDINE HCL 25 MG/ML IJ SOLN: 6.2500 mg | INTRAMUSCULAR | Status: DC | PRN

## 2012-03-12 MED ORDER — MUPIROCIN 2 % EX OINT
TOPICAL_OINTMENT | Freq: Two times a day (BID) | CUTANEOUS | Status: DC
  Filled 2012-03-12 (×2): qty 22

## 2012-03-12 MED ORDER — MIDAZOLAM HCL 2 MG/2ML IJ SOLN: 0.5000 mg | Freq: Once | INTRAMUSCULAR | Status: DC | PRN

## 2012-03-12 MED ORDER — SODIUM CHLORIDE 0.9 % IV SOLN
INTRAVENOUS | Status: DC | PRN
  Administered 2012-03-12: 11:00:00 via INTRAVENOUS

## 2012-03-12 MED ORDER — HYDROCODONE-ACETAMINOPHEN 5-325 MG PO TABS: 1.0000 | ORAL_TABLET | Freq: Four times a day (QID) | ORAL | Status: DC | PRN

## 2012-03-12 MED ORDER — POVIDONE-IODINE 10 % EX OINT
TOPICAL_OINTMENT | CUTANEOUS | Status: AC
  Filled 2012-03-12: qty 28.35

## 2012-03-12 MED ORDER — BUPIVACAINE-EPINEPHRINE (PF) 0.5% -1:200000 IJ SOLN
INTRAMUSCULAR | Status: AC
  Filled 2012-03-12: qty 10

## 2012-03-12 MED ORDER — ONDANSETRON HCL 4 MG/2ML IJ SOLN
INTRAMUSCULAR | Status: DC | PRN
  Administered 2012-03-12: 4 mg via INTRAVENOUS

## 2012-03-12 MED ORDER — EPHEDRINE SULFATE 50 MG/ML IJ SOLN
INTRAMUSCULAR | Status: DC | PRN
  Administered 2012-03-12 (×4): 10 mg via INTRAVENOUS

## 2012-03-12 MED ORDER — SODIUM BICARBONATE 4 % IV SOLN
INTRAVENOUS | Status: AC
  Filled 2012-03-12: qty 5

## 2012-03-12 MED ORDER — FENTANYL CITRATE 0.05 MG/ML IJ SOLN
INTRAMUSCULAR | Status: DC | PRN
  Administered 2012-03-12: 50 ug via INTRAVENOUS

## 2012-03-12 MED ORDER — FENTANYL CITRATE 0.05 MG/ML IJ SOLN: 25.0000 ug | INTRAMUSCULAR | Status: DC | PRN

## 2012-03-12 MED ORDER — BUPIVACAINE-EPINEPHRINE 0.5% -1:200000 IJ SOLN
INTRAMUSCULAR | Status: DC | PRN
  Administered 2012-03-12: 29 mL

## 2012-03-12 MED ORDER — CEFAZOLIN SODIUM-DEXTROSE 2-3 GM-% IV SOLR
INTRAVENOUS | Status: DC | PRN
  Administered 2012-03-12: 2 g via INTRAVENOUS

## 2012-03-12 MED ORDER — PROPOFOL 10 MG/ML IV BOLUS
INTRAVENOUS | Status: DC | PRN
  Administered 2012-03-12: 200 mg via INTRAVENOUS

## 2012-03-12 MED ORDER — CEFAZOLIN SODIUM 1-5 GM-% IV SOLN
INTRAVENOUS | Status: AC
  Filled 2012-03-12: qty 100

## 2012-03-12 MED ORDER — PROMETHAZINE HCL 25 MG/ML IJ SOLN: 6.2500 mg | INTRAMUSCULAR | Status: DC | PRN

## 2012-03-12 MED ORDER — LIDOCAINE HCL (PF) 1 % IJ SOLN
INTRAMUSCULAR | Status: AC
  Filled 2012-03-12: qty 30

## 2012-03-12 MED ORDER — LIDOCAINE HCL (CARDIAC) 20 MG/ML IV SOLN
INTRAVENOUS | Status: DC | PRN
  Administered 2012-03-12: 30 mg via INTRAVENOUS

## 2012-03-12 MED ORDER — MIDAZOLAM HCL 5 MG/5ML IJ SOLN
INTRAMUSCULAR | Status: DC | PRN
  Administered 2012-03-12: 1 mg via INTRAVENOUS

## 2012-03-12 SURGICAL SUPPLY — 48 items
ADH SKN CLS APL DERMABOND .7 (GAUZE/BANDAGES/DRESSINGS)
BLADE SURG 15 STRL LF DISP TIS (BLADE) ×1 IMPLANT
BLADE SURG 15 STRL SS (BLADE) ×2
CANISTER SUCTION 2500CC (MISCELLANEOUS) IMPLANT
CHLORAPREP W/TINT 26ML (MISCELLANEOUS) ×2 IMPLANT
CLOTH BEACON ORANGE TIMEOUT ST (SAFETY) ×2 IMPLANT
COVER SURGICAL LIGHT HANDLE (MISCELLANEOUS) ×2 IMPLANT
DECANTER SPIKE VIAL GLASS SM (MISCELLANEOUS) IMPLANT
DERMABOND ADVANCED (GAUZE/BANDAGES/DRESSINGS)
DERMABOND ADVANCED .7 DNX12 (GAUZE/BANDAGES/DRESSINGS) IMPLANT
DRAPE LAPAROTOMY T 102X78X121 (DRAPES) ×2 IMPLANT
DRAPE UTILITY 15X26 W/TAPE STR (DRAPE) ×4 IMPLANT
ELECT CAUTERY BLADE 6.4 (BLADE) ×2 IMPLANT
ELECT REM PT RETURN 9FT ADLT (ELECTROSURGICAL) ×2
ELECTRODE REM PT RTRN 9FT ADLT (ELECTROSURGICAL) ×1 IMPLANT
GAUZE SPONGE 4X4 16PLY XRAY LF (GAUZE/BANDAGES/DRESSINGS) ×2 IMPLANT
GLOVE BIOGEL PI IND STRL 8 (GLOVE) ×1 IMPLANT
GLOVE BIOGEL PI INDICATOR 8 (GLOVE) ×1
GLOVE SS BIOGEL STRL SZ 7.5 (GLOVE) ×1 IMPLANT
GLOVE SUPERSENSE BIOGEL SZ 7.5 (GLOVE) ×1
GLOVE SURG SS PI 7.0 STRL IVOR (GLOVE) ×1 IMPLANT
GOWN STRL NON-REIN LRG LVL3 (GOWN DISPOSABLE) ×2 IMPLANT
GOWN STRL REIN XL XLG (GOWN DISPOSABLE) ×2 IMPLANT
KIT BASIN OR (CUSTOM PROCEDURE TRAY) ×2 IMPLANT
KIT ROOM TURNOVER OR (KITS) ×2 IMPLANT
NDL HYPO 25GX1X1/2 BEV (NEEDLE) ×1 IMPLANT
NEEDLE HYPO 25GX1X1/2 BEV (NEEDLE) ×2 IMPLANT
NS IRRIG 1000ML POUR BTL (IV SOLUTION) ×2 IMPLANT
PACK SURGICAL SETUP 50X90 (CUSTOM PROCEDURE TRAY) ×2 IMPLANT
PAD ARMBOARD 7.5X6 YLW CONV (MISCELLANEOUS) ×4 IMPLANT
PENCIL BUTTON HOLSTER BLD 10FT (ELECTRODE) ×2 IMPLANT
SPONGE GAUZE 4X4 12PLY (GAUZE/BANDAGES/DRESSINGS) ×1 IMPLANT
SPONGE LAP 4X18 X RAY DECT (DISPOSABLE) IMPLANT
SUT CHROMIC 3 0 SH 27 (SUTURE) IMPLANT
SUT ETHILON 4 0 PS 2 18 (SUTURE) ×1 IMPLANT
SUT ETHILON 5 0 PS 2 18 (SUTURE) IMPLANT
SUT MNCRL AB 3-0 PS2 18 (SUTURE) IMPLANT
SUT PROLENE 2 0 CT2 30 (SUTURE) IMPLANT
SUT VIC AB 2-0 CT3 27 (SUTURE) IMPLANT
SUT VICRYL 0 UR6 27IN ABS (SUTURE) ×1 IMPLANT
SYR BULB 3OZ (MISCELLANEOUS) IMPLANT
SYR CONTROL 10ML LL (SYRINGE) ×2 IMPLANT
TAPE CLOTH SURG 6X10 WHT LF (GAUZE/BANDAGES/DRESSINGS) ×1 IMPLANT
TOWEL OR 17X24 6PK STRL BLUE (TOWEL DISPOSABLE) ×2 IMPLANT
TOWEL OR 17X26 10 PK STRL BLUE (TOWEL DISPOSABLE) ×2 IMPLANT
TUBE CONNECTING 12X1/4 (SUCTIONS) IMPLANT
WATER STERILE IRR 1000ML POUR (IV SOLUTION) IMPLANT
YANKAUER SUCT BULB TIP NO VENT (SUCTIONS) IMPLANT

## 2012-03-12 NOTE — Op Note (Signed)
Preoperative Diagnosis: nonfunctioning catheter and history of peritonitis  Postoprative Diagnosis: nonfunctioning catheter and history of peritonitis  Procedure: Procedure(s): CONTINUOUS AMBULATORY PERITONEAL DIALYSIS  (CAPD) CATHETER REMOVAL   Surgeon: Glenna Fellows T   Assistants: None  Anesthesia:  General LMA anesthesiaDiagnos  Indications:   Patient is a 76 year old male with end-stage renal disease and a previously laparoscopically placed peritoneal dialysis catheter. He has had several episodes of peritonitis and his catheter is nonfunctioning and removal has been recommended. Discussed the procedure and risks detailed elsewhere and he is in agreement.  Procedure Detail:  Patient is brought to the operating room, placed in the supine position on the operating table, and laryngeal mask Gen. anesthesia induced.  The abdomen and catheter were widely sterilely prepped and draped. Patient timeout was performed and the procedure verified. He received preoperative antibiotics and PAS were in place. The previous small right. Umbilical incision was opened and dissection was carried down through the subcutaneous tissue with cautery. The catheter was dissected free in the subcutaneous tissue and divided. Holding the abdominal and for traction cautery dissection was carried down alongside of the catheter and then the fascia was incised over the dacryon cuff and the dacryon cuff completely dissected out of the surrounding abdominal wall. The peritoneum over the Silastic ball was then incised circumferentially around the ball and then the catheter was free it was removed intact. The fascial defect was closed with interrupted 0 Vicryl.Was assured. The soft tissue was infiltrated with Marcaine and the skin closed with interrupted 4-0 nylon. The exit site was then excised back to couple of millimeters circumferentially and dissection carried down along the catheter and then the dacryon cuff with  cautery and the external portion of the catheter and cuff completely removed. The small skin opening was packed with sterile gauze. Sponge needle counts were correct.   Estimated Blood Loss:  Minimal         Drains: none  Blood Given: none          Specimens: none        Complications:  * No complications entered in OR log *         Disposition: PACU - hemodynamically stable.         Condition: stable

## 2012-03-12 NOTE — H&P (View-Only) (Signed)
Chief complaint: Peritoneal dialysis catheter, nonfunctioning, peritonitis  History: Patient returns to the office status post laparoscopic placement of peritoneal dialysis catheter in April. I was not aware but apparently he had 2 episodes of peritonitis in June and had a lot of pain with the flushes following that and peritoneal dialysis has been stopped. He has been back on hemodialysis. He is here today for request of Dr. Meyer Cory to discuss removal of his peritoneal dialysis catheter. It has not been used for at least a couple of months. He currently does not have abdominal pain or symptoms of infection.  Past Medical History  Diagnosis Date  . Hyperparathyroidism   . Arthritis   . Hyperlipidemia   . Hypertension   . Chronic kidney disease   . Neuromuscular disorder   . Stroke   . GERD (gastroesophageal reflux disease)   . Headache     occasional  migraine   . Heart attack 2011; 1990's  . Type II diabetes mellitus   . Peripheral neuropathy   . Anxiety   . Depression   . Dialysis patient     M, W, F; [address] (09/19/11)   Past Surgical History  Procedure Date  . Colon surgery 2011    colon resection   . Parathyroidectomy 2011  . Capd insertion 09/12/2011    Procedure: LAPAROSCOPIC INSERTION CONTINUOUS AMBULATORY PERITONEAL DIALYSIS  (CAPD) CATHETER;  Surgeon: Mariella Saa, MD;  Location: [facility] OR;  Service: General;  Laterality: N/A;  . Sp av dialysis shunt access existing *l* 2003  . Dg av dialysis shunt intro ndl*r* or     "not working" (09/19/11)   Current Outpatient Prescriptions  Medication Sig Dispense Refill  . amLODipine (NORVASC) 10 MG tablet Take 10 mg by mouth daily.      Marland Kitchen aspirin 325 MG tablet Take 325 mg by mouth every evening.      Marland Kitchen atorvastatin (LIPITOR) 20 MG tablet Take 1 tablet (20 mg total) by mouth daily at 6 PM.  30 tablet  0  . b complex-vitamin c-folic acid (NEPHRO-VITE) 0.8 MG TABS Take 0.8 mg by mouth at bedtime.      . calcium  carbonate (TUMS - DOSED IN MG ELEMENTAL CALCIUM) 500 MG chewable tablet Chew 1 tablet by mouth 3 (three) times daily with meals.      . hydrOXYzine (ATARAX/VISTARIL) 10 MG tablet Take 10 mg by mouth at bedtime as needed. For sleep      . loratadine (CLARITIN) 10 MG tablet Take 10 mg by mouth daily.      . metoprolol tartrate (LOPRESSOR) 25 MG tablet Take 25 mg by mouth 2 (two) times daily.      . paricalcitol (ZEMPLAR) 5 MCG/ML injection Inject 7 mcg into the vein 3 (three) times a week. Given on Mon, Wed & Fri at dialysis       Allergies  Allergen Reactions  . Benazepril Hcl Hives   Exam: BP 140/84  Pulse 82  Temp 97 F (36.1 C) (Temporal)  Resp 18  Ht 5\' 5"  (1.651 m)  Wt 213 lb 6.4 oz (96.798 kg)  BMI 35.51 kg/m2  SpO2 100% General: Obese African American male who does not appear ill Skin: No rash or infection HEENT: No scleral icterus. No masses. Lungs: Clear equal breath sounds without increased work of breathing Cardiac: Regular rate and rhythm. No peripheral edema. Abdomen: Multiple healed incisions. Peritoneal abscess The right lower quadrant. Exit site looks okay. Mild tenderness at the insertion site.  Extremities: No joint swelling or edema Neurologic: He is alert and oriented. Gait normal.  Assessment and plan: Peritoneal dialysis catheter, nonfunctioning, history of peritonitis. I recommend going ahead with removal under general anesthesia as an outpatient on nondialysis day. We discussed risks of surgery including anesthetic complications and bleeding and infection. He understands and agrees.

## 2012-03-12 NOTE — Preoperative (Signed)
Beta Blockers   Reason not to administer Beta Blockers:Not Applicable 

## 2012-03-12 NOTE — Interval H&P Note (Signed)
History and Physical Interval Note:  03/12/2012 11:38 AM  Sean [name]  has presented today for surgery, with the diagnosis of nonfunctioning catheter and history of peritonitis  The various methods of treatment have been discussed with the patient and family. After consideration of risks, benefits and other options for treatment, the patient has consented to  Procedure(s) (LRB) with comments: CONTINUOUS AMBULATORY PERITONEAL DIALYSIS  (CAPD) CATHETER REMOVAL (N/A) as a surgical intervention .  The patient's history has been reviewed, patient examined, no change in status, stable for surgery.  I have reviewed the patient's chart and labs.  Questions were answered to the patient's satisfaction.     [name] T

## 2012-03-12 NOTE — Discharge Instructions (Signed)
Leave dressing on and dry tonight  Remove the dressing tomorrow. Removed gauze packing from the small skin opening. May shower and cover wound with dry gauze. Change dressing daily.  Return to office in 10 days for suture removal or more convenient may have sutures removed at the dialysis center  Call [phone] surgery as needed for questions or concerns

## 2012-03-12 NOTE — Anesthesia Procedure Notes (Signed)
Procedure Name: LMA Insertion Date/Time: 03/12/2012 11:53 AM Performed by: Sharlene Dory E Pre-anesthesia Checklist: Patient identified, Emergency Drugs available, Suction available, Patient being monitored and Timeout performed Patient Re-evaluated:Patient Re-evaluated prior to inductionOxygen Delivery Method: Circle system utilized Preoxygenation: Pre-oxygenation with 100% oxygen Intubation Type: IV induction Ventilation: Mask ventilation without difficulty LMA: LMA inserted LMA Size: 4.0 Number of attempts: 1 Placement Confirmation: positive ETCO2 and breath sounds checked- equal and bilateral Tube secured with: Tape Dental Injury: Teeth and Oropharynx as per pre-operative assessment

## 2012-03-12 NOTE — Transfer of Care (Signed)
Immediate Anesthesia Transfer of Care Note  Patient: Sean [name]  Procedure(s) Performed: Procedure(s) (LRB) with comments: CONTINUOUS AMBULATORY PERITONEAL DIALYSIS  (CAPD) CATHETER REMOVAL (N/A)  Patient Location: PACU  Anesthesia Type: General  Level of Consciousness: awake, alert  and oriented  Airway & Oxygen Therapy: Patient Spontanous Breathing and Patient connected to nasal cannula oxygen  Post-op Assessment: Report given to PACU RN, Post -op Vital signs reviewed and stable and Patient moving all extremities X 4  Post vital signs: Reviewed and stable  Complications: No apparent anesthesia complications

## 2012-03-12 NOTE — Anesthesia Postprocedure Evaluation (Signed)
  Anesthesia Post-op Note  Patient: Sean [name]  Procedure(s) Performed: Procedure(s) (LRB) with comments: CONTINUOUS AMBULATORY PERITONEAL DIALYSIS  (CAPD) CATHETER REMOVAL (N/A)  Patient Location: PACU  Anesthesia Type: General  Level of Consciousness: awake, alert , oriented and patient cooperative  Airway and Oxygen Therapy: Patient Spontanous Breathing  Post-op Pain: none  Post-op Assessment: Post-op Vital signs reviewed, Patient's Cardiovascular Status Stable, Respiratory Function Stable, Patent Airway, No signs of Nausea or vomiting and Pain level controlled  Post-op Vital Signs: Reviewed and stable  Complications: No apparent anesthesia complications

## 2012-03-12 NOTE — Anesthesia Preprocedure Evaluation (Addendum)
Anesthesia Evaluation  Patient identified by MRN, date of birth, ID band Patient awake    Reviewed: Allergy & Precautions, H&P , NPO status , Patient's Chart, lab work & pertinent test results, reviewed documented beta blocker date and time   Airway Mallampati: III TM Distance: >3 FB Neck ROM: Full    Dental  (+) Edentulous Upper and Edentulous Lower   Pulmonary COPD (oxygen on dialysis) oxygen dependent, former smoker (quit 25 years),  breath sounds clear to auscultation  Pulmonary exam normal       Cardiovascular hypertension, Pt. on medications + Past MI Rhythm:Regular Rate:Normal  4/13 ECHO: vigorous LVF, EF 65-70%, valves OK    Neuro/Psych  Headaches, PSYCHIATRIC DISORDERS Anxiety Depression  Neuromuscular disease CVA (speech and memory symptoms), Residual Symptoms    GI/Hepatic Neg liver ROS, GERD-  Controlled,  Endo/Other  diabetes (diet and dialysis controlled, glu 86), Well Controlled, Type obesity  Renal/GU ESRF and DialysisRenal disease (k+4.3, MonWedFri dialysis)     Musculoskeletal   Abdominal (+) + obese,   Peds  Hematology   Anesthesia Other Findings   Reproductive/Obstetrics                         Anesthesia Physical Anesthesia Plan  ASA: III  Anesthesia Plan: General   Post-op Pain Management:    Induction: Intravenous  Airway Management Planned: LMA  Additional Equipment:   Intra-op Plan:   Post-operative Plan:   Informed Consent: I have reviewed the patients History and Physical, chart, labs and discussed the procedure including the risks, benefits and alternatives for the proposed anesthesia with the patient or authorized representative who has indicated his/her understanding and acceptance.     Plan Discussed with: CRNA and Surgeon  Anesthesia Plan Comments: (Plan routine monitors, GA- LMA OK)        Anesthesia Quick Evaluation

## 2012-03-12 NOTE — Progress Notes (Signed)
Pt has graft in left arm with good bruit/thrill.

## 2012-03-14 ENCOUNTER — Encounter (HOSPITAL_COMMUNITY): Payer: Self-pay | Admitting: General Surgery

## 2012-03-14 ENCOUNTER — Encounter (INDEPENDENT_AMBULATORY_CARE_PROVIDER_SITE_OTHER): Payer: Medicare Other | Admitting: General Surgery

## 2012-03-22 ENCOUNTER — Encounter (INDEPENDENT_AMBULATORY_CARE_PROVIDER_SITE_OTHER): Payer: Medicare Other

## 2012-03-26 ENCOUNTER — Ambulatory Visit (INDEPENDENT_AMBULATORY_CARE_PROVIDER_SITE_OTHER): Payer: Medicare Other

## 2012-03-26 ENCOUNTER — Encounter (INDEPENDENT_AMBULATORY_CARE_PROVIDER_SITE_OTHER): Payer: Self-pay

## 2012-03-26 VITALS — BP 130/78 | HR 66 | Temp 97.0°F | Resp 18 | Ht 65.0 in | Wt 209.4 lb

## 2012-03-26 DIAGNOSIS — Z4802 Encounter for removal of sutures: Secondary | ICD-10-CM

## 2012-03-26 NOTE — Progress Notes (Signed)
Patient seen today for suture removal (s/p CAPD Cath removal 02/20/12) sutures removed, incision intact, steri-strips placed. Patient tolerated well.

## 2012-07-04 ENCOUNTER — Encounter (HOSPITAL_BASED_OUTPATIENT_CLINIC_OR_DEPARTMENT_OTHER): Payer: Medicare Other | Attending: Internal Medicine

## 2012-07-04 DIAGNOSIS — X58XXXA Exposure to other specified factors, initial encounter: Secondary | ICD-10-CM | POA: Insufficient documentation

## 2012-07-04 DIAGNOSIS — N186 End stage renal disease: Secondary | ICD-10-CM | POA: Insufficient documentation

## 2012-07-04 DIAGNOSIS — Z8673 Personal history of transient ischemic attack (TIA), and cerebral infarction without residual deficits: Secondary | ICD-10-CM | POA: Insufficient documentation

## 2012-07-04 DIAGNOSIS — I120 Hypertensive chronic kidney disease with stage 5 chronic kidney disease or end stage renal disease: Secondary | ICD-10-CM | POA: Insufficient documentation

## 2012-07-04 DIAGNOSIS — I252 Old myocardial infarction: Secondary | ICD-10-CM | POA: Insufficient documentation

## 2012-07-04 DIAGNOSIS — E785 Hyperlipidemia, unspecified: Secondary | ICD-10-CM | POA: Insufficient documentation

## 2012-07-04 DIAGNOSIS — S91309A Unspecified open wound, unspecified foot, initial encounter: Secondary | ICD-10-CM | POA: Insufficient documentation

## 2012-07-05 NOTE — Progress Notes (Signed)
[facility]  NAME:  Sean [name], Sean [name] NO.:  [id number]  MEDICAL RECORD NO.:  [id number]      DATE OF BIRTH:  [birth year]  PHYSICIAN:  Maxwell Caul, M.D. VISIT DATE:  07/04/2012                                  OFFICE VISIT   LOCATION:  [facility].  HISTORY:  [name] is a gentleman who returns today for review of a wound on his right Achilles area.  He has a history of recurrent wounds on his lower extremities, felt to be secondary to calciphylaxis.  I believe he has had a diagnostic biopsy in the past.  We have had very difficult admissions to the [facility] in the past for treatment of these wounds.  It is noteworthy that as usual in these cases, the wounds are exceedingly painful.  The patient has underlying chronic renal failure, on dialysis.  PAST MEDICAL HISTORY:  Includes: 1. Left parietal CVA in April of 2013. 2. End-stage renal disease, on dialysis secondary to diabetes. 3. Hyperlipidemia. 4. Hypertension. 5. MI in January of 2011. 6. Venous stasis. 7. Calciphylaxis. 8. History of an ischemic colitis.  PAST SURGICAL HISTORY:  Includes a partial colon resection, parathyroidectomy, cardiac cath with a history of percutaneous coronary interventions in 2011.  MEDICATION LIST:  Reviewed.  PHYSICAL EXAMINATION:  Temperature is 98.7, pulse 64, respirations 18, blood pressure is 124/54.  The area in question is in his right Achilles area, is a small wound, measuring 0.4 x 0.3 x 0.1.  This is unfortunately covered by a very tight eschar.  I attempted debridement here, but I could not make any headway with debridement of the small wound due to extreme pain in the area.  IMPRESSION:  Wound in the right Achilles area, likely secondary to calciphylaxis.  This has been a recurrent problem for this patient. Debridement will be necessary.  We used Santyl with a Tielle covering to this wound.  Hopefully,  this will soften up the eschar to allow for further mechanical debridement next week.This is unfortunately usually painful.          ______________________________ Maxwell Caul, M.D.     MGR/MEDQ  D:  07/04/2012  T:  07/05/2012  Job:  [id number]

## 2012-07-18 ENCOUNTER — Encounter (HOSPITAL_BASED_OUTPATIENT_CLINIC_OR_DEPARTMENT_OTHER): Payer: Medicare Other | Attending: Internal Medicine

## 2012-12-23 ENCOUNTER — Encounter (HOSPITAL_BASED_OUTPATIENT_CLINIC_OR_DEPARTMENT_OTHER): Payer: Medicare Other | Attending: Plastic Surgery

## 2012-12-23 DIAGNOSIS — I120 Hypertensive chronic kidney disease with stage 5 chronic kidney disease or end stage renal disease: Secondary | ICD-10-CM | POA: Insufficient documentation

## 2012-12-23 DIAGNOSIS — Z992 Dependence on renal dialysis: Secondary | ICD-10-CM | POA: Insufficient documentation

## 2012-12-23 DIAGNOSIS — E1169 Type 2 diabetes mellitus with other specified complication: Secondary | ICD-10-CM | POA: Insufficient documentation

## 2012-12-23 DIAGNOSIS — I739 Peripheral vascular disease, unspecified: Secondary | ICD-10-CM | POA: Insufficient documentation

## 2012-12-23 DIAGNOSIS — E785 Hyperlipidemia, unspecified: Secondary | ICD-10-CM | POA: Insufficient documentation

## 2012-12-23 DIAGNOSIS — Z79899 Other long term (current) drug therapy: Secondary | ICD-10-CM | POA: Insufficient documentation

## 2012-12-23 DIAGNOSIS — L97509 Non-pressure chronic ulcer of other part of unspecified foot with unspecified severity: Secondary | ICD-10-CM | POA: Insufficient documentation

## 2012-12-23 DIAGNOSIS — N186 End stage renal disease: Secondary | ICD-10-CM | POA: Insufficient documentation

## 2012-12-23 DIAGNOSIS — I252 Old myocardial infarction: Secondary | ICD-10-CM | POA: Insufficient documentation

## 2012-12-23 NOTE — Progress Notes (Signed)
[facility]  NAME:  Sean [name], KANT NO.:  [id number]  MEDICAL RECORD NO.:  [id number]      DATE OF BIRTH:  26-Jan-1936  PHYSICIAN:  Wayland Denis, DO       VISIT DATE:  12/23/2012                                  OFFICE VISIT   HISTORY:  The patient is a 77 year old gentleman who is here for followup.  He is here for an evaluation of bilateral lower extremity diabetic foot ulcer, Wegener 2.  He is not putting anything on there now and he has a very strong history of calciphylaxis and ulcers in the past.  He saw the vascular surgeon two years ago.  I do not have those reports here, but he does have peripheral vascular disease.  His wounds have been present for the past several weeks and seemed to be getting worse slowly.  PAST MEDICAL HISTORY: 1. Positive for diabetes. 2. Ischemic colitis. 3. Calciphylaxis. 4. Venous stasis. 5. MI. 6. Hyperlipidemia. 7. Hypertension. 8. End-stage renal disease.  SURGICAL HISTORY: 1. Colon resection. 2. Parathyroidectomy. 3. Cardiac cath. 4. Percutaneous coronary intervention. 5. AV graft revision. 6. Peritoneal dialysis.  MEDICATIONS: 1. Toprol. 2. Metoprolol. 3. Dilantin. 4. Atarax. 5. Vitamin C. 6. Lipitor. 7. Aspirin. 8. Norvasc. 9. Ambien. 10.Nephro-Vite. 11.Xanax. 12.Paxil. 13.Ibuprofen. 14.Isosorbide. 15.Clopidogrel.  ALLERGIES:  He is allergic to, 1. Lotensin. 2. Zoloft. 3. Sensipar.  REVIEW OF SYSTEMS:  Otherwise negative.  His family history not contributory at this point.  He is alert, oriented, and cooperative.  PHYSICAL EXAMINATION:  Not in any acute distress.  He is pleasant and seems to be a good historian.  He did not have any cervical lymphadenopathy.  His breathing is unlabored.  His heart rate is regular.  His abdomen is large, but soft.  He has scars on the lower extremity and two wounds, one on either side.  IMPRESSION:  Wegener 2.  No bone is exposed.  One  is medial left and the other is posterior right leg at the lower third of the leg.  His pulses are very weak.  He has an AV fistula or graft in the left arm.  His toenails are in great need of coping.  He does have a podiatrist and states that he will seek the podiatrist consult for those.  We will use Santyl with Mepilex and see him back in a week.  Recommended elevation. We will check a pre-albumin.  He may need to see vascular surgery again, increase protein but not too much since he is on dialysis.  He needs to participate in the diabetic nutrition classes.     Wayland Denis, DO     CS/MEDQ  D:  12/23/2012  T:  12/23/2012  Job:  [id number]

## 2013-01-16 ENCOUNTER — Encounter (HOSPITAL_BASED_OUTPATIENT_CLINIC_OR_DEPARTMENT_OTHER): Payer: Medicare Other | Attending: Internal Medicine

## 2013-01-16 DIAGNOSIS — E1169 Type 2 diabetes mellitus with other specified complication: Secondary | ICD-10-CM | POA: Insufficient documentation

## 2013-01-16 DIAGNOSIS — L97809 Non-pressure chronic ulcer of other part of unspecified lower leg with unspecified severity: Secondary | ICD-10-CM | POA: Insufficient documentation

## 2013-02-13 ENCOUNTER — Encounter (HOSPITAL_BASED_OUTPATIENT_CLINIC_OR_DEPARTMENT_OTHER): Payer: Medicare Other | Attending: Internal Medicine

## 2013-02-13 DIAGNOSIS — E1169 Type 2 diabetes mellitus with other specified complication: Secondary | ICD-10-CM | POA: Insufficient documentation

## 2013-02-13 DIAGNOSIS — L97809 Non-pressure chronic ulcer of other part of unspecified lower leg with unspecified severity: Secondary | ICD-10-CM | POA: Insufficient documentation

## 2013-03-13 ENCOUNTER — Encounter (HOSPITAL_BASED_OUTPATIENT_CLINIC_OR_DEPARTMENT_OTHER): Payer: Medicare Other | Attending: Internal Medicine

## 2013-03-13 DIAGNOSIS — E1169 Type 2 diabetes mellitus with other specified complication: Secondary | ICD-10-CM | POA: Insufficient documentation

## 2013-03-13 DIAGNOSIS — L97809 Non-pressure chronic ulcer of other part of unspecified lower leg with unspecified severity: Secondary | ICD-10-CM | POA: Insufficient documentation

## 2013-05-19 ENCOUNTER — Ambulatory Visit: Payer: Self-pay | Admitting: Podiatry

## 2013-11-18 ENCOUNTER — Encounter (HOSPITAL_BASED_OUTPATIENT_CLINIC_OR_DEPARTMENT_OTHER): Payer: Medicare Other | Attending: General Surgery

## 2013-11-18 DIAGNOSIS — Z9049 Acquired absence of other specified parts of digestive tract: Secondary | ICD-10-CM | POA: Insufficient documentation

## 2013-11-18 DIAGNOSIS — E785 Hyperlipidemia, unspecified: Secondary | ICD-10-CM | POA: Insufficient documentation

## 2013-11-18 DIAGNOSIS — I798 Other disorders of arteries, arterioles and capillaries in diseases classified elsewhere: Secondary | ICD-10-CM | POA: Insufficient documentation

## 2013-11-18 DIAGNOSIS — L97409 Non-pressure chronic ulcer of unspecified heel and midfoot with unspecified severity: Secondary | ICD-10-CM | POA: Diagnosis not present

## 2013-11-18 DIAGNOSIS — Z992 Dependence on renal dialysis: Secondary | ICD-10-CM | POA: Insufficient documentation

## 2013-11-18 DIAGNOSIS — I120 Hypertensive chronic kidney disease with stage 5 chronic kidney disease or end stage renal disease: Secondary | ICD-10-CM | POA: Insufficient documentation

## 2013-11-18 DIAGNOSIS — I252 Old myocardial infarction: Secondary | ICD-10-CM | POA: Insufficient documentation

## 2013-11-18 DIAGNOSIS — E119 Type 2 diabetes mellitus without complications: Secondary | ICD-10-CM | POA: Diagnosis not present

## 2013-11-18 DIAGNOSIS — N186 End stage renal disease: Secondary | ICD-10-CM | POA: Diagnosis not present

## 2013-11-18 DIAGNOSIS — Z7982 Long term (current) use of aspirin: Secondary | ICD-10-CM | POA: Insufficient documentation

## 2013-11-18 DIAGNOSIS — Z79899 Other long term (current) drug therapy: Secondary | ICD-10-CM | POA: Insufficient documentation

## 2013-11-18 DIAGNOSIS — L988 Other specified disorders of the skin and subcutaneous tissue: Secondary | ICD-10-CM | POA: Diagnosis not present

## 2013-11-18 DIAGNOSIS — Z8673 Personal history of transient ischemic attack (TIA), and cerebral infarction without residual deficits: Secondary | ICD-10-CM | POA: Insufficient documentation

## 2013-11-18 DIAGNOSIS — E1159 Type 2 diabetes mellitus with other circulatory complications: Secondary | ICD-10-CM | POA: Insufficient documentation

## 2013-11-18 DIAGNOSIS — M79609 Pain in unspecified limb: Secondary | ICD-10-CM | POA: Insufficient documentation

## 2013-11-18 LAB — GLUCOSE, CAPILLARY: Glucose-Capillary: 211 mg/dL — ABNORMAL HIGH (ref 70–99)

## 2013-11-19 NOTE — H&P (Signed)
NAME:  Sean [name], Sean [name] NO.:  [id number]  MEDICAL RECORD NO.:  [id number]  LOCATION:  FOOT                         FACILITY:  [facility]  PHYSICIAN:  Elesa Hacker, M.D.        DATE OF BIRTH:  [birth year]  DATE OF ADMISSION:  11/18/2013 DATE OF DISCHARGE:                             HISTORY & PHYSICAL   CHIEF COMPLAINT:  Painful wounds, left leg.  HISTORY OF PRESENT ILLNESS:  This is a 78 year old male that has been seen in this clinic several times with calciphylaxis.  He is an end- stage renal patient on 3 times a week dialysis and we have had a diagnostic biopsy done in the past.  For the past 2 weeks, he has had 2 painful areas and blackening of the skin on the left foreleg.  PAST MEDICAL HISTORY: 1. CVA in 2013. 2. End-stage renal disease, on dialysis. 3. Diabetes mellitus. 4. Hyperlipidemia. 5. Hypertension. 6. Myocardial infarction. 7. History of ischemic colitis.  PAST SURGICAL HISTORY:  Colon resection, parathyroidectomy, and a cardiac cath with stent placement.  SOCIAL HISTORY:  Cigarettes none.  Alcohol occasionally.  MEDICATIONS:  Amlodipine, aspirin, atorvastatin, hydroxyzine, hydrocodone, loratadine, metoprolol.  ALLERGIES:  BENAZEPRIL caused hives.  REVIEW OF SYSTEMS:  As above.  PHYSICAL EXAMINATION:  VITAL SIGNS:  Temperature 97.6, pulse 96, respirations 18, blood pressure 103/67.  Glucose is 211. GENERAL APPEARANCE:  Well developed, well nourished, no distress. CHEST:  Clear. HEART:  Regular rhythm. ABDOMEN:  Protuberant. EXTREMITIES:  Examination of the left lower extremity reveals peripheral pulses are not palpable, ABI is not obtainable.  There are 2 wounds on the foreleg, one is 2.9 x 3.4, one is 7.5 x 5.0.  These are hard black spots, which are markedly adherent and very tender to touch.  IMPRESSION:  Chronic renal failure, calciphylaxis, possible peripheral vascular disease in addition.  PLAN OF TREATMENT:  We will start with  Santyl and Hydrogel and loose wraps.  We will see if these wounds do not heal promptly, we will do vascular studies.     Elesa Hacker, M.D.     RA/MEDQ  D:  11/18/2013  T:  11/19/2013  Job:  [id number]

## 2013-11-25 DIAGNOSIS — I798 Other disorders of arteries, arterioles and capillaries in diseases classified elsewhere: Secondary | ICD-10-CM | POA: Diagnosis not present

## 2013-11-25 DIAGNOSIS — E119 Type 2 diabetes mellitus without complications: Secondary | ICD-10-CM | POA: Diagnosis not present

## 2013-11-25 DIAGNOSIS — L988 Other specified disorders of the skin and subcutaneous tissue: Secondary | ICD-10-CM | POA: Diagnosis not present

## 2013-11-25 DIAGNOSIS — I120 Hypertensive chronic kidney disease with stage 5 chronic kidney disease or end stage renal disease: Secondary | ICD-10-CM | POA: Diagnosis not present

## 2013-11-25 DIAGNOSIS — L97409 Non-pressure chronic ulcer of unspecified heel and midfoot with unspecified severity: Secondary | ICD-10-CM | POA: Diagnosis not present

## 2013-11-25 DIAGNOSIS — E1159 Type 2 diabetes mellitus with other circulatory complications: Secondary | ICD-10-CM | POA: Diagnosis not present

## 2013-11-25 DIAGNOSIS — N186 End stage renal disease: Secondary | ICD-10-CM | POA: Diagnosis not present

## 2013-11-25 LAB — GLUCOSE, CAPILLARY: Glucose-Capillary: 104 mg/dL — ABNORMAL HIGH (ref 70–99)

## 2013-12-02 DIAGNOSIS — L988 Other specified disorders of the skin and subcutaneous tissue: Secondary | ICD-10-CM | POA: Diagnosis not present

## 2013-12-02 DIAGNOSIS — E1159 Type 2 diabetes mellitus with other circulatory complications: Secondary | ICD-10-CM | POA: Diagnosis not present

## 2013-12-02 DIAGNOSIS — I798 Other disorders of arteries, arterioles and capillaries in diseases classified elsewhere: Secondary | ICD-10-CM | POA: Diagnosis not present

## 2013-12-02 DIAGNOSIS — L97409 Non-pressure chronic ulcer of unspecified heel and midfoot with unspecified severity: Secondary | ICD-10-CM | POA: Diagnosis not present

## 2013-12-02 DIAGNOSIS — I120 Hypertensive chronic kidney disease with stage 5 chronic kidney disease or end stage renal disease: Secondary | ICD-10-CM | POA: Diagnosis not present

## 2013-12-02 DIAGNOSIS — N186 End stage renal disease: Secondary | ICD-10-CM | POA: Diagnosis not present

## 2013-12-02 DIAGNOSIS — E119 Type 2 diabetes mellitus without complications: Secondary | ICD-10-CM | POA: Diagnosis not present

## 2013-12-02 LAB — GLUCOSE, CAPILLARY: GLUCOSE-CAPILLARY: 95 mg/dL (ref 70–99)

## 2013-12-09 DIAGNOSIS — L97409 Non-pressure chronic ulcer of unspecified heel and midfoot with unspecified severity: Secondary | ICD-10-CM | POA: Diagnosis not present

## 2013-12-09 DIAGNOSIS — E119 Type 2 diabetes mellitus without complications: Secondary | ICD-10-CM | POA: Diagnosis not present

## 2013-12-09 DIAGNOSIS — E1159 Type 2 diabetes mellitus with other circulatory complications: Secondary | ICD-10-CM | POA: Diagnosis not present

## 2013-12-09 DIAGNOSIS — I120 Hypertensive chronic kidney disease with stage 5 chronic kidney disease or end stage renal disease: Secondary | ICD-10-CM | POA: Diagnosis not present

## 2013-12-09 DIAGNOSIS — L988 Other specified disorders of the skin and subcutaneous tissue: Secondary | ICD-10-CM | POA: Diagnosis not present

## 2013-12-09 DIAGNOSIS — N186 End stage renal disease: Secondary | ICD-10-CM | POA: Diagnosis not present

## 2013-12-09 DIAGNOSIS — I798 Other disorders of arteries, arterioles and capillaries in diseases classified elsewhere: Secondary | ICD-10-CM | POA: Diagnosis not present

## 2013-12-13 ENCOUNTER — Emergency Department (HOSPITAL_COMMUNITY): Payer: Medicare Other

## 2013-12-13 ENCOUNTER — Encounter (HOSPITAL_COMMUNITY): Payer: Self-pay | Admitting: Emergency Medicine

## 2013-12-13 ENCOUNTER — Observation Stay (HOSPITAL_COMMUNITY): Payer: Medicare Other

## 2013-12-13 ENCOUNTER — Inpatient Hospital Stay (HOSPITAL_COMMUNITY)
Admission: EM | Admit: 2013-12-13 | Discharge: 2013-12-16 | DRG: 313 | Disposition: A | Discharge status: Home Health | Payer: Medicare Other | Attending: Internal Medicine | Admitting: Internal Medicine

## 2013-12-13 DIAGNOSIS — N186 End stage renal disease: Secondary | ICD-10-CM | POA: Diagnosis present

## 2013-12-13 DIAGNOSIS — L97909 Non-pressure chronic ulcer of unspecified part of unspecified lower leg with unspecified severity: Secondary | ICD-10-CM | POA: Diagnosis present

## 2013-12-13 DIAGNOSIS — R0789 Other chest pain: Principal | ICD-10-CM

## 2013-12-13 DIAGNOSIS — I10 Essential (primary) hypertension: Secondary | ICD-10-CM | POA: Diagnosis present

## 2013-12-13 DIAGNOSIS — M79605 Pain in left leg: Secondary | ICD-10-CM

## 2013-12-13 DIAGNOSIS — I120 Hypertensive chronic kidney disease with stage 5 chronic kidney disease or end stage renal disease: Secondary | ICD-10-CM | POA: Diagnosis present

## 2013-12-13 DIAGNOSIS — I509 Heart failure, unspecified: Secondary | ICD-10-CM | POA: Diagnosis present

## 2013-12-13 DIAGNOSIS — R7989 Other specified abnormal findings of blood chemistry: Secondary | ICD-10-CM

## 2013-12-13 DIAGNOSIS — I5020 Unspecified systolic (congestive) heart failure: Secondary | ICD-10-CM | POA: Diagnosis present

## 2013-12-13 DIAGNOSIS — R51 Headache: Secondary | ICD-10-CM

## 2013-12-13 DIAGNOSIS — I252 Old myocardial infarction: Secondary | ICD-10-CM

## 2013-12-13 DIAGNOSIS — I2584 Coronary atherosclerosis due to calcified coronary lesion: Secondary | ICD-10-CM | POA: Diagnosis present

## 2013-12-13 DIAGNOSIS — R5381 Other malaise: Secondary | ICD-10-CM | POA: Diagnosis present

## 2013-12-13 DIAGNOSIS — Z7982 Long term (current) use of aspirin: Secondary | ICD-10-CM

## 2013-12-13 DIAGNOSIS — L97921 Non-pressure chronic ulcer of unspecified part of left lower leg limited to breakdown of skin: Secondary | ICD-10-CM

## 2013-12-13 DIAGNOSIS — R5383 Other fatigue: Secondary | ICD-10-CM

## 2013-12-13 DIAGNOSIS — R079 Chest pain, unspecified: Secondary | ICD-10-CM | POA: Diagnosis not present

## 2013-12-13 DIAGNOSIS — Z87891 Personal history of nicotine dependence: Secondary | ICD-10-CM

## 2013-12-13 DIAGNOSIS — R000 Tachycardia, unspecified: Secondary | ICD-10-CM | POA: Diagnosis present

## 2013-12-13 DIAGNOSIS — Z8673 Personal history of transient ischemic attack (TIA), and cerebral infarction without residual deficits: Secondary | ICD-10-CM

## 2013-12-13 DIAGNOSIS — Z992 Dependence on renal dialysis: Secondary | ICD-10-CM

## 2013-12-13 DIAGNOSIS — E1142 Type 2 diabetes mellitus with diabetic polyneuropathy: Secondary | ICD-10-CM | POA: Diagnosis present

## 2013-12-13 DIAGNOSIS — I2510 Atherosclerotic heart disease of native coronary artery without angina pectoris: Secondary | ICD-10-CM | POA: Diagnosis present

## 2013-12-13 DIAGNOSIS — Z79899 Other long term (current) drug therapy: Secondary | ICD-10-CM

## 2013-12-13 DIAGNOSIS — D649 Anemia, unspecified: Secondary | ICD-10-CM | POA: Diagnosis present

## 2013-12-13 DIAGNOSIS — R778 Other specified abnormalities of plasma proteins: Secondary | ICD-10-CM

## 2013-12-13 DIAGNOSIS — E785 Hyperlipidemia, unspecified: Secondary | ICD-10-CM | POA: Diagnosis present

## 2013-12-13 DIAGNOSIS — I447 Left bundle-branch block, unspecified: Secondary | ICD-10-CM | POA: Diagnosis present

## 2013-12-13 DIAGNOSIS — E1149 Type 2 diabetes mellitus with other diabetic neurological complication: Secondary | ICD-10-CM | POA: Diagnosis present

## 2013-12-13 LAB — CBC WITH DIFFERENTIAL/PLATELET
BASOS ABS: 0 10*3/uL (ref 0.0–0.1)
Basophils Relative: 0 % (ref 0–1)
EOS PCT: 0 % (ref 0–5)
Eosinophils Absolute: 0 10*3/uL (ref 0.0–0.7)
HCT: 29.8 % — ABNORMAL LOW (ref 39.0–52.0)
Hemoglobin: 9.6 g/dL — ABNORMAL LOW (ref 13.0–17.0)
LYMPHS ABS: 0.6 10*3/uL — AB (ref 0.7–4.0)
Lymphocytes Relative: 7 % — ABNORMAL LOW (ref 12–46)
MCH: 27.3 pg (ref 26.0–34.0)
MCHC: 32.2 g/dL (ref 30.0–36.0)
MCV: 84.7 fL (ref 78.0–100.0)
Monocytes Absolute: 1.3 10*3/uL — ABNORMAL HIGH (ref 0.1–1.0)
Monocytes Relative: 16 % — ABNORMAL HIGH (ref 3–12)
Neutro Abs: 6.5 10*3/uL (ref 1.7–7.7)
Neutrophils Relative %: 77 % (ref 43–77)
PLATELETS: 312 10*3/uL (ref 150–400)
RBC: 3.52 MIL/uL — ABNORMAL LOW (ref 4.22–5.81)
RDW: 14.2 % (ref 11.5–15.5)
WBC: 8.5 10*3/uL (ref 4.0–10.5)

## 2013-12-13 LAB — COMPREHENSIVE METABOLIC PANEL
ALT: 8 U/L (ref 0–53)
AST: 17 U/L (ref 0–37)
Albumin: 2.7 g/dL — ABNORMAL LOW (ref 3.5–5.2)
Alkaline Phosphatase: 93 U/L (ref 39–117)
Anion gap: 19 — ABNORMAL HIGH (ref 5–15)
BUN: 33 mg/dL — ABNORMAL HIGH (ref 6–23)
CALCIUM: 9.1 mg/dL (ref 8.4–10.5)
CO2: 26 mEq/L (ref 19–32)
Chloride: 92 mEq/L — ABNORMAL LOW (ref 96–112)
Creatinine, Ser: 8.79 mg/dL — ABNORMAL HIGH (ref 0.50–1.35)
GFR calc non Af Amer: 5 mL/min — ABNORMAL LOW (ref >90)
GFR, EST AFRICAN AMERICAN: 6 mL/min — AB (ref >90)
GLUCOSE: 129 mg/dL — AB (ref 70–99)
Potassium: 4 mEq/L (ref 3.7–5.3)
SODIUM: 137 meq/L (ref 137–147)
TOTAL PROTEIN: 8.1 g/dL (ref 6.0–8.3)
Total Bilirubin: 0.9 mg/dL (ref 0.3–1.2)

## 2013-12-13 LAB — PRO B NATRIURETIC PEPTIDE: Pro B Natriuretic peptide (BNP): >70000 pg/mL — ABNORMAL HIGH (ref 0–450)

## 2013-12-13 LAB — I-STAT CG4 LACTIC ACID, ED: Lactic Acid, Venous: 2.42 mmol/L — ABNORMAL HIGH (ref 0.5–2.2)

## 2013-12-13 LAB — I-STAT TROPONIN, ED: TROPONIN I, POC: 0.42 ng/mL — AB (ref 0.00–0.08)

## 2013-12-13 LAB — GLUCOSE, CAPILLARY
GLUCOSE-CAPILLARY: 103 mg/dL — AB (ref 70–99)
Glucose-Capillary: 141 mg/dL — ABNORMAL HIGH (ref 70–99)

## 2013-12-13 LAB — TROPONIN I: TROPONIN I: 0.55 ng/mL — AB (ref <0.30)

## 2013-12-13 MED ORDER — SODIUM CHLORIDE 0.9 % IJ SOLN
3.0000 mL | Freq: Two times a day (BID) | INTRAMUSCULAR | Status: DC
  Administered 2013-12-14 – 2013-12-16 (×4): 3 mL via INTRAVENOUS

## 2013-12-13 MED ORDER — MORPHINE SULFATE 2 MG/ML IJ SOLN
1.0000 mg | INTRAMUSCULAR | Status: DC | PRN
  Administered 2013-12-14 – 2013-12-15 (×4): 1 mg via INTRAVENOUS
  Filled 2013-12-13 (×3): qty 1

## 2013-12-13 MED ORDER — INSULIN ASPART 100 UNIT/ML ~~LOC~~ SOLN
0.0000 [IU] | Freq: Three times a day (TID) | SUBCUTANEOUS | Status: DC
  Administered 2013-12-14 – 2013-12-16 (×4): 1 [IU] via SUBCUTANEOUS

## 2013-12-13 MED ORDER — ONDANSETRON HCL 4 MG/2ML IJ SOLN: 4.0000 mg | Freq: Four times a day (QID) | INTRAMUSCULAR | Status: DC | PRN

## 2013-12-13 MED ORDER — SODIUM CHLORIDE 0.9 % IV BOLUS (SEPSIS): 500.0000 mL | Freq: Once | INTRAVENOUS | Status: DC

## 2013-12-13 MED ORDER — HYDROCODONE-ACETAMINOPHEN 5-325 MG PO TABS
1.0000 | ORAL_TABLET | Freq: Four times a day (QID) | ORAL | Status: DC | PRN
  Administered 2013-12-13 – 2013-12-16 (×7): 1 via ORAL
  Filled 2013-12-13 (×3): qty 1
  Filled 2013-12-13: qty 2
  Filled 2013-12-13 (×2): qty 1

## 2013-12-13 MED ORDER — HEPARIN (PORCINE) IN NACL 100-0.45 UNIT/ML-% IJ SOLN
1400.0000 [IU]/h | INTRAMUSCULAR | Status: DC
  Administered 2013-12-13: 1300 [IU]/h via INTRAVENOUS
  Administered 2013-12-14: 1400 [IU]/h via INTRAVENOUS
  Filled 2013-12-13 (×2): qty 250

## 2013-12-13 MED ORDER — CALCIUM CARBONATE ANTACID 500 MG PO CHEW
1.0000 | CHEWABLE_TABLET | Freq: Three times a day (TID) | ORAL | Status: DC
  Administered 2013-12-14 – 2013-12-16 (×6): 200 mg via ORAL
  Filled 2013-12-13 (×10): qty 1

## 2013-12-13 MED ORDER — AMLODIPINE BESYLATE 10 MG PO TABS
10.0000 mg | ORAL_TABLET | Freq: Every day | ORAL | Status: DC
  Administered 2013-12-13 – 2013-12-16 (×3): 10 mg via ORAL
  Filled 2013-12-13 (×4): qty 1

## 2013-12-13 MED ORDER — HEPARIN SODIUM (PORCINE) 5000 UNIT/ML IJ SOLN
5000.0000 [IU] | Freq: Three times a day (TID) | INTRAMUSCULAR | Status: DC
  Filled 2013-12-13 (×2): qty 1

## 2013-12-13 MED ORDER — ACETAMINOPHEN 650 MG RE SUPP: 650.0000 mg | Freq: Four times a day (QID) | RECTAL | Status: DC | PRN

## 2013-12-13 MED ORDER — SODIUM CHLORIDE 0.9 % IJ SOLN
3.0000 mL | Freq: Two times a day (BID) | INTRAMUSCULAR | Status: DC
  Administered 2013-12-14 – 2013-12-16 (×3): 3 mL via INTRAVENOUS

## 2013-12-13 MED ORDER — ASPIRIN 325 MG PO TABS
325.0000 mg | ORAL_TABLET | Freq: Every evening | ORAL | Status: DC
  Administered 2013-12-13 – 2013-12-16 (×4): 325 mg via ORAL
  Filled 2013-12-13 (×4): qty 1

## 2013-12-13 MED ORDER — ONDANSETRON HCL 4 MG PO TABS: 4.0000 mg | ORAL_TABLET | Freq: Four times a day (QID) | ORAL | Status: DC | PRN

## 2013-12-13 MED ORDER — ONDANSETRON HCL 4 MG/2ML IJ SOLN: 4.0000 mg | Freq: Once | INTRAMUSCULAR | Status: DC

## 2013-12-13 MED ORDER — SODIUM CHLORIDE 0.9 % IV SOLN: 250.0000 mL | INTRAVENOUS | Status: DC | PRN

## 2013-12-13 MED ORDER — INSULIN ASPART 100 UNIT/ML ~~LOC~~ SOLN: 0.0000 [IU] | Freq: Every day | SUBCUTANEOUS | Status: DC

## 2013-12-13 MED ORDER — ATORVASTATIN CALCIUM 20 MG PO TABS
20.0000 mg | ORAL_TABLET | Freq: Every day | ORAL | Status: DC
  Administered 2013-12-14 – 2013-12-16 (×3): 20 mg via ORAL
  Filled 2013-12-13 (×3): qty 1

## 2013-12-13 MED ORDER — HYDROXYZINE HCL 10 MG PO TABS
10.0000 mg | ORAL_TABLET | Freq: Every evening | ORAL | Status: DC | PRN
  Filled 2013-12-13: qty 1

## 2013-12-13 MED ORDER — HEPARIN BOLUS VIA INFUSION
4000.0000 [IU] | Freq: Once | INTRAVENOUS | Status: AC
  Administered 2013-12-13: 4000 [IU] via INTRAVENOUS
  Filled 2013-12-13: qty 4000

## 2013-12-13 MED ORDER — RENA-VITE PO TABS
1.0000 | ORAL_TABLET | Freq: Every day | ORAL | Status: DC
  Administered 2013-12-13 – 2013-12-14 (×2): 1 via ORAL
  Filled 2013-12-13 (×4): qty 1

## 2013-12-13 MED ORDER — ALUM & MAG HYDROXIDE-SIMETH 200-200-20 MG/5ML PO SUSP: 30.0000 mL | Freq: Four times a day (QID) | ORAL | Status: DC | PRN

## 2013-12-13 MED ORDER — ACETAMINOPHEN 325 MG PO TABS: 650.0000 mg | ORAL_TABLET | Freq: Four times a day (QID) | ORAL | Status: DC | PRN

## 2013-12-13 MED ORDER — SODIUM CHLORIDE 0.9 % IJ SOLN: 3.0000 mL | INTRAMUSCULAR | Status: DC | PRN

## 2013-12-13 MED ORDER — FAMOTIDINE 10 MG PO TABS
10.0000 mg | ORAL_TABLET | Freq: Every day | ORAL | Status: DC
  Administered 2013-12-13 – 2013-12-16 (×4): 10 mg via ORAL
  Filled 2013-12-13 (×4): qty 1

## 2013-12-13 NOTE — ED Notes (Signed)
attempted report 

## 2013-12-13 NOTE — ED Notes (Signed)
Lactic acid and troponin results given to Dr. Stevie Kern

## 2013-12-13 NOTE — ED Provider Notes (Signed)
CSN: [id number]     Arrival date & time 12/13/13  1337 History   First MD Initiated Contact with Patient 12/13/13 1345     Chief Complaint  Patient presents with  . Chest Pain  . Wound Check     (Consider location/radiation/quality/duration/timing/severity/associated sxs/prior Treatment) HPI 78 year old male dialysis patient lives at home alone gets wound checks 3 times a week for his chronic left leg calciphylaxis but missed dialysis yesterday and he did not have a home wound check yesterday as he typically would, his last dialysis was 3 days ago on Wednesday, he states he is now too generally weak to take care of himself at home, he states over the last week or 2 he has had multiple episodes per day of nausea and nonbloody vomiting as well as spells lasting a few minutes each of a vague mild chest discomfort with shortness of breath at times but no fever no cough no chest pain now no shortness of breath now no abdominal pain no diarrhea no bloody stools but he states usually he can walk with a walker in his apartment in a wheelchair when he is outside of his apartment but he is too weak yesterday and today to walk inside his apartment with his walker; he states there is no change to the look of his wounds on his left leg where he has a large black areas of skin necrosis and eschar and at baseline does not have palpable pulses in his feet. He also has diabetes with neuropathy with baseline painful numbness in both feet and legs. Past Medical History  Diagnosis Date  . Hyperparathyroidism   . Arthritis   . Hyperlipidemia   . Hypertension   . Neuromuscular disorder   . Stroke   . GERD (gastroesophageal reflux disease)   . Headache(784.0)     occasional  migraine   . Heart attack 2011; 1990's  . Type II diabetes mellitus   . Peripheral neuropathy   . Anxiety   . Depression   . Dialysis patient     M, W, F; [place] (09/19/11)  . Chronic kidney disease     [facility] MWF   Past  Surgical History  Procedure Laterality Date  . Colon surgery  2011    colon resection   . Parathyroidectomy  2011  . Capd insertion  09/12/2011    Procedure: LAPAROSCOPIC INSERTION CONTINUOUS AMBULATORY PERITONEAL DIALYSIS  (CAPD) CATHETER;  Surgeon: Edward Jolly, MD;  Location: [facility];  Service: General;  Laterality: N/A;  . Sp av dialysis shunt access existing *l*  2003  . Dg av dialysis shunt intro ndl*r* or      "not working" (09/19/11)  . Capd removal  03/12/2012    Procedure: CONTINUOUS AMBULATORY PERITONEAL DIALYSIS  (CAPD) CATHETER REMOVAL;  Surgeon: Edward Jolly, MD;  Location: [facility];  Service: General;  Laterality: N/A;   Family History  Problem Relation Age of Onset  . Cancer Sister     breast  . Cancer Brother     lung  . Anesthesia problems Neg Hx   . Hypotension Neg Hx   . Malignant hyperthermia Neg Hx   . Pseudochol deficiency Neg Hx    History  Substance Use Topics  . Smoking status: Former Smoker -- 0.25 packs/day for 7 years    Types: Cigarettes    Quit date: 06/12/1976  . Smokeless tobacco: Never Used  . Alcohol Use: Yes     Comment: 09/19/11 "occasionally; last alcohol  2011"    Review of Systems 10 Systems reviewed and are negative for acute change except as noted in the HPI.   Allergies  Benazepril hcl  Home Medications   Prior to Admission medications   Medication Sig Start Date End Date Taking? Authorizing Provider  amLODipine (NORVASC) 10 MG tablet Take 10 mg by mouth daily.   Yes Historical Provider, MD  aspirin 325 MG tablet Take 325 mg by mouth every evening.   Yes Historical Provider, MD  atorvastatin (LIPITOR) 20 MG tablet Take 1 tablet (20 mg total) by mouth daily at 6 PM. 09/22/11 12/13/13 Yes Charlynne Cousins, MD  b complex-vitamin c-folic acid (NEPHRO-VITE) 0.8 MG TABS Take 0.8 mg by mouth at bedtime.   Yes Historical Provider, MD  calcium carbonate (TUMS - DOSED IN MG ELEMENTAL CALCIUM) 500 MG chewable tablet Chew 1 tablet by  mouth 3 (three) times daily with meals.   Yes Historical Provider, MD  HYDROcodone-acetaminophen (NORCO/VICODIN) 5-325 MG per tablet Take 1 tablet by mouth every 6 (six) hours as needed for moderate pain.   Yes Historical Provider, MD  hydrOXYzine (ATARAX/VISTARIL) 10 MG tablet Take 10 mg by mouth at bedtime as needed. For sleep   Yes Historical Provider, MD  OVER THE COUNTER MEDICATION Take 60 mLs by mouth daily. "Fruit of the Spirit."   Yes Historical Provider, MD   BP 163/74  Pulse 100  Temp(Src) 98.6 F (37 C)  Resp 19  Ht 5\' 5"  (1.651 m)  Wt 202 lb 13.2 oz (92 kg)  BMI 33.75 kg/m2  SpO2 93% Physical Exam  Nursing note and vitals reviewed. Constitutional:  Awake, alert, nontoxic appearance.  HENT:  Head: Atraumatic.  Eyes: Right eye exhibits no discharge. Left eye exhibits no discharge.  Neck: Neck supple.  Cardiovascular: Regular rhythm.   No murmur heard. Mildly tachycardic  Pulmonary/Chest: Effort normal and breath sounds normal. No respiratory distress. He has no wheezes. He has no rales. He exhibits no tenderness.  Abdominal: Soft. Bowel sounds are normal. He exhibits no distension and no mass. There is no tenderness. There is no rebound and no guarding.  Musculoskeletal: He exhibits tenderness. He exhibits no edema.  Baseline ROM, no obvious new focal weakness. Left lower leg with multiple large dry black wounds without purulent drainage fluctuance foul odor or surrounding erythema or increased warmth; both feet at baseline numbness with capillary refill less than 3 seconds without palpable pulses in his feet or ankles  Neurological: He is alert.  Mental status and motor strength appears baseline for patient and situation.  Skin: No rash noted.  Psychiatric: He has a normal mood and affect.    ED Course  Procedures (including critical care time) D/w [facility] for Obs; cannot tell if trop from renal failure or NSTEMI; [facility] will see Pt in ED.Patient / Family / Caregiver  informed of clinical course, understand medical decision-making process, and agree with plan. Labs Review Labs Reviewed  CBC WITH DIFFERENTIAL - Abnormal; Notable for the following:    RBC 3.52 (*)    Hemoglobin 9.6 (*)    HCT 29.8 (*)    Lymphocytes Relative 7 (*)    Lymphs Abs 0.6 (*)    Monocytes Relative 16 (*)    Monocytes Absolute 1.3 (*)    All other components within normal limits  COMPREHENSIVE METABOLIC PANEL - Abnormal; Notable for the following:    Chloride 92 (*)    Glucose, Bld 129 (*)    BUN 33 (*)  Creatinine, Ser 8.79 (*)    Albumin 2.7 (*)    GFR calc non Af Amer 5 (*)    GFR calc Af Amer 6 (*)    Anion gap 19 (*)    All other components within normal limits  PRO B NATRIURETIC PEPTIDE - Abnormal; Notable for the following:    Pro B Natriuretic peptide (BNP) >70000.0 (*)    All other components within normal limits  I-STAT CG4 LACTIC ACID, ED - Abnormal; Notable for the following:    Lactic Acid, Venous 2.42 (*)    All other components within normal limits  I-STAT TROPOININ, ED - Abnormal; Notable for the following:    Troponin i, poc 0.42 (*)    All other components within normal limits  CULTURE, BLOOD (ROUTINE X 2)  CULTURE, BLOOD (ROUTINE X 2)  TROPONIN I  TROPONIN I  TROPONIN I    Imaging Review Dg Chest Port 1 View  12/13/2013   CLINICAL DATA:  Left-sided chest pressure  EXAM: PORTABLE CHEST - 1 VIEW  COMPARISON:  02/20/2012  FINDINGS: There is no focal parenchymal opacity, pleural effusion, or pneumothorax. The heart and mediastinal contours are unremarkable. There is thoracic aortic atherosclerosis.  There is mild cardiomegaly.  The osseous structures are unremarkable.  IMPRESSION: No active disease.   Electronically Signed   By: Kathreen Devoid   On: 12/13/2013 14:33     EKG Interpretation   Date/Time:  Saturday December 13 2013 13:45:24 EDT Ventricular Rate:  104 PR Interval:  153 QRS Duration: 152 QT Interval:  405 QTC Calculation: 533 R Axis:    -31 Text Interpretation:  Sinus tachycardia Atrial premature complex Left  bundle branch block Compared to previous tracing Left bundle branch block  NOW PRESENT Confirmed by Stevie Kern  MD, Jenny Reichmann ([id number]) on 12/13/2013 1:48:19 PM      MDM   Final diagnoses:  Chest pain, unspecified chest pain type  Elevated troponin  End stage renal disease on dialysis    The patient appears reasonably stabilized for admission considering the current resources, flow, and capabilities available in the ED at this time, and I doubt any other [facility] requiring further screening and/or treatment in the ED prior to admission.    Babette Relic, MD 12/13/13 [phone]

## 2013-12-13 NOTE — Consult Note (Signed)
Referring Physician: Daryll Drown Primary Cardiologist: [facility]  (Dr. Tamala Julian) Reason for Consultation: CP with new LBBB on ECG   HPI:  [name] is a 78 yo man with PMH of CAD s/p NSTEMI and failed RCA PCI in 2011, CVA in 2013, DM, HLD, ESRD on HD MWF, HTN who is admitted for calciphylaxis. We are consulted for atypical CP and new LBBB.   He presented to ER today feeling weak with worsening pain in his left leg. He further noted decreased PO intake, nausea with intake and vomiting for about 2 days.  He has chest pain, pressure like with trying to eat and chest pain with vomiting. He is not very mobile due to leg wounds, but the pain seems mostly to occur when vomiting. He denies any exertional CP. Occasionally has mild SOB. In ER, Trop 0.4. pBNP > 70K. ECG NSR with LBBB (previously was IVCD in 2013)   Last cath 07/06/09 by Dr. Marlou Porch 1. Left main artery - highly calcified branches into the LAD, ramus as     well as circumflex arterial distributions.  No angiographically     flow-limiting disease. 2. Left anterior descending artery.  Proximal vessel was calcified as     well as midvessel.  There was moderate diffuse disease throughout     this vessel with stenosis of up to 30-40%.  However, there was no     flow-limiting disease present. 3. Circumflex artery - there were three obtuse marginal branches.     There was distal stenosis in the circumflex distribution of 60-70%     and otherwise, moderate diffuse disease. 4. Ramus branch, mild disease throughout this vessel. 5. Right coronary artery - the proximal/midsection at the branch of     the first acute marginal demonstrates an 80% focal stenosis and     distal to that, there is diffuse stenosis of up to 80% throughout     the entire midvessel to the branch point of the posterior     descending artery.  There was some haziness in the proximal     stenotic lesion.  This is likely the culprit for the non-ST  elevation myocardial infarction. 6. Left ventriculogram.  Normal left ventricular ejection fraction of     75%   At that time PCI was attempted of RCA but lesion was heavily calcified and could not be dilated with a balloon. Discussion was had regarding rotational athrectomy but I cannot find a report saying this was done. Appears to have had medical therapy.    Review of Systems:     Cardiac Review of Systems: {Y] = yes [ ]  = no  Chest Pain [  y  ]  Resting SOB [   ] Exertional SOB  [  ]  Orthopnea [  ]   Pedal Edema [   ]    Palpitations [  ] Syncope  [  ]   Presyncope [   ]  General Review of Systems: [Y] = yes [  ]=no Constitional: recent weight change [  ]; anorexia [ y ]; fatigue Blue.Reese  ]; nausea [  y]; night sweats [  ]; fever [  ]; or chills [  ];  Eyes : blurred vision [  ]; diplopia [   ]; vision changes [  ];  Amaurosis fugax[  ]; Resp: cough [  ];  wheezing[  ];  hemoptysis[  ];  PND [  ];  GI:  gallstones[  ], vomiting[y  ];  dysphagia[  ]; melena[  ];  hematochezia [  ]; heartburn[  ];   GU: kidney stones [  ]; hematuria[  ];   dysuria [  ];  nocturia[  ]; incontinence [  ];             Skin: rash, swelling[  ];, hair loss[  ];  peripheral edema[  ];  or itching[  ]; Musculosketetal: myalgias[y  ];  joint swelling[  ];  joint erythema[  ];  joint pain[ y ];  back pain[  ];  Heme/Lymph: bruising[  ];  bleeding[  ];  anemia[  ];  Neuro: TIA[  ];  headaches[y  ];  stroke[y  ];  vertigo[  ];  seizures[  ];   paresthesias[  ];  difficulty walking[  ];  Psych:depression[ y ]; [place]  ];  Endocrine: diabetes[y  ];  thyroid dysfunction[  ];  Other:  Past Medical History  Diagnosis Date  . Hyperparathyroidism   . Arthritis   . Hyperlipidemia   . Hypertension   . Neuromuscular disorder   . Stroke   . GERD (gastroesophageal reflux disease)   . Headache(784.0)     occasional  migraine   . Heart attack 2011;  1990's  . Type II diabetes mellitus   . Peripheral neuropathy   . Anxiety   . Depression   . Dialysis patient     M, W, F; [place] (09/19/11)  . Chronic kidney disease     [facility] MWF    Medications Prior to Admission  Medication Sig Dispense Refill  . amLODipine (NORVASC) 10 MG tablet Take 10 mg by mouth daily.      Marland Kitchen aspirin 325 MG tablet Take 325 mg by mouth every evening.      Marland Kitchen atorvastatin (LIPITOR) 20 MG tablet Take 1 tablet (20 mg total) by mouth daily at 6 PM.  30 tablet  0  . b complex-vitamin c-folic acid (NEPHRO-VITE) 0.8 MG TABS Take 0.8 mg by mouth at bedtime.      . calcium carbonate (TUMS - DOSED IN MG ELEMENTAL CALCIUM) 500 MG chewable tablet Chew 1 tablet by mouth 3 (three) times daily with meals.      Marland Kitchen HYDROcodone-acetaminophen (NORCO/VICODIN) 5-325 MG per tablet Take 1 tablet by mouth every 6 (six) hours as needed for moderate pain.      . hydrOXYzine (ATARAX/VISTARIL) 10 MG tablet Take 10 mg by mouth at bedtime as needed. For sleep      . OVER THE COUNTER MEDICATION Take 60 mLs by mouth daily. "Fruit of the Spirit."         . amLODipine  10 mg Oral Daily  . aspirin  325 mg Oral QPM  . [START ON 12/14/2013] atorvastatin  20 mg Oral q1800  . [START ON 12/14/2013] calcium carbonate  1 tablet Oral TID WC  . famotidine  10 mg Oral Daily  . heparin  5,000 Units Subcutaneous 3 times per day  . multivitamin  1 tablet Oral QHS  . sodium chloride  3 mL Intravenous Q12H  . sodium chloride  3 mL Intravenous Q12H    Infusions:    Allergies  Allergen Reactions  .  Benazepril Hcl Hives    History   Social History  . Marital Status: Legally Separated    Spouse Name: N/A    Number of Children: N/A  . Years of Education: N/A   Occupational History  . Not on file.   Social History Main Topics  . Smoking status: Former Smoker -- 0.25 packs/day for 7 years    Types: Cigarettes    Quit date: 06/12/1976  . Smokeless tobacco: Never Used  . Alcohol Use: Yes      Comment: 09/19/11 "occasionally; last alcohol 2011"  . Drug Use: No  . Sexual Activity: No   Other Topics Concern  . Not on file   Social History Narrative  . No narrative on file    Family History  Problem Relation Age of Onset  . Cancer Sister     breast  . Cancer Brother     lung  . Anesthesia problems Neg Hx   . Hypotension Neg Hx   . Malignant hyperthermia Neg Hx   . Pseudochol deficiency Neg Hx     PHYSICAL EXAM: Filed Vitals:   12/13/13 1907  BP: 160/93  Pulse: 103  Temp: 98.5 F (36.9 C)  Resp: 20    No intake or output data in the 24 hours ending 12/13/13 1922  General:  Well appearing. No respiratory difficulty HEENT: normal Neck: supple. no JVD. Carotids 2+ bilat; no bruits. No lymphadenopathy or thryomegaly appreciated. Cor: PMI nondisplaced. Regular rate & rhythm. No rubs, gallops or murmurs. Lungs: clear Abdomen: soft, nontender, nondistended. No hepatosplenomegaly. No bruits or masses. Good bowel sounds. Extremities: no cyanosis, clubbing,  Edema. LUE with AVF and thrill. Large wound LLE. Multiple healed scars RLE Neuro: alert & oriented x 3, cranial nerves grossly intact. moves all 4 extremities w/o difficulty. Affect pleasant.  ECG: Sinus tach 104 + PACs LBBB (new since previous 2013)  Results for orders placed during the hospital encounter of 12/13/13 (from the past 24 hour(s))  CBC WITH DIFFERENTIAL     Status: Abnormal   Collection Time    12/13/13  2:07 PM      Result Value Ref Range   WBC 8.5  4.0 - 10.5 K/uL   RBC 3.52 (*) 4.22 - 5.81 MIL/uL   Hemoglobin 9.6 (*) 13.0 - 17.0 g/dL   HCT 29.8 (*) 39.0 - 52.0 %   MCV 84.7  78.0 - 100.0 fL   MCH 27.3  26.0 - 34.0 pg   MCHC 32.2  30.0 - 36.0 g/dL   RDW 14.2  11.5 - 15.5 %   Platelets 312  150 - 400 K/uL   Neutrophils Relative % 77  43 - 77 %   Neutro Abs 6.5  1.7 - 7.7 K/uL   Lymphocytes Relative 7 (*) 12 - 46 %   Lymphs Abs 0.6 (*) 0.7 - 4.0 K/uL   Monocytes Relative 16 (*) 3 - 12 %    Monocytes Absolute 1.3 (*) 0.1 - 1.0 K/uL   Eosinophils Relative 0  0 - 5 %   Eosinophils Absolute 0.0  0.0 - 0.7 K/uL   Basophils Relative 0  0 - 1 %   Basophils Absolute 0.0  0.0 - 0.1 K/uL  COMPREHENSIVE METABOLIC PANEL     Status: Abnormal   Collection Time    12/13/13  2:07 PM      Result Value Ref Range   Sodium 137  137 - 147 mEq/L   Potassium 4.0  3.7 - 5.3 mEq/L  Chloride 92 (*) 96 - 112 mEq/L   CO2 26  19 - 32 mEq/L   Glucose, Bld 129 (*) 70 - 99 mg/dL   BUN 33 (*) 6 - 23 mg/dL   Creatinine, Ser 8.79 (*) 0.50 - 1.35 mg/dL   Calcium 9.1  8.4 - 10.5 mg/dL   Total Protein 8.1  6.0 - 8.3 g/dL   Albumin 2.7 (*) 3.5 - 5.2 g/dL   AST 17  0 - 37 U/L   ALT 8  0 - 53 U/L   Alkaline Phosphatase 93  39 - 117 U/L   Total Bilirubin 0.9  0.3 - 1.2 mg/dL   GFR calc non Af Amer 5 (*) >90 mL/min   GFR calc Af Amer 6 (*) >90 mL/min   Anion gap 19 (*) 5 - 15  PRO B NATRIURETIC PEPTIDE     Status: Abnormal   Collection Time    12/13/13  2:08 PM      Result Value Ref Range   Pro B Natriuretic peptide (BNP) >70000.0 (*) 0 - 450 pg/mL  I-STAT TROPOININ, ED     Status: Abnormal   Collection Time    12/13/13  2:53 PM      Result Value Ref Range   Troponin i, poc 0.42 (*) 0.00 - 0.08 ng/mL   Comment NOTIFIED PHYSICIAN     Comment 3           I-STAT CG4 LACTIC ACID, ED     Status: Abnormal   Collection Time    12/13/13  2:55 PM      Result Value Ref Range   Lactic Acid, Venous 2.42 (*) 0.5 - 2.2 mmol/L  GLUCOSE, CAPILLARY     Status: Abnormal   Collection Time    12/13/13  7:04 PM      Result Value Ref Range   Glucose-Capillary 103 (*) 70 - 99 mg/dL   Dg Chest Port 1 View  12/13/2013   CLINICAL DATA:  Left-sided chest pressure  EXAM: PORTABLE CHEST - 1 VIEW  COMPARISON:  02/20/2012  FINDINGS: There is no focal parenchymal opacity, pleural effusion, or pneumothorax. The heart and mediastinal contours are unremarkable. There is thoracic aortic atherosclerosis.  There is mild  cardiomegaly.  The osseous structures are unremarkable.  IMPRESSION: No active disease.   Electronically Signed   By: Kathreen Devoid   On: 12/13/2013 14:33     ASSESSMENT: 1. Chest pain, atypical 2. Mildly elevated troponin 3. CAD    --s/pNSTEMI and failed RCA PCI in 06/2009 4. Calciphylaxis with large LLE wound. 5. ESRD 6. DM2 7. LBBB  PLAN/DISCUSSION:  CP very atypical despite underlying CAD. Troponin only minimally elevated in setting of ESRD. Would cycle troponin. Check echo. If troponin rises markedly will consider cath. If troponin unchanged would probably do Lexiscan and only cath if very high risk (versus medical therapy). Treat with ASA, statin and b-blocker. Would not heparinize at this point.   Rockie Vawter,MD 8:00 PM

## 2013-12-13 NOTE — Progress Notes (Signed)
ANTICOAGULATION CONSULT NOTE - Initial Consult  Pharmacy Consult for hearin Indication: NSTEMI  Allergies  Allergen Reactions  . Benazepril Hcl Hives    Patient Measurements: Height: 5\' 5"  (165.1 cm) Weight: 202 lb 13.2 oz (92 kg) IBW/kg (Calculated) : 61.5 Heparin Dosing Weight:    Vital Signs: Temp: 98.5 F (36.9 C) (07/04 1907) Temp src: Oral (07/04 1907) BP: 160/93 mmHg (07/04 1907) Pulse Rate: 103 (07/04 1907)  Labs:  Recent Labs  12/13/13 1407  HGB 9.6*  HCT 29.8*  PLT 312  CREATININE 8.79*    Estimated Creatinine Clearance: 7.5 ml/min (by C-G formula based on Cr of 8.79).   Medical History: Past Medical History  Diagnosis Date  . Hyperparathyroidism   . Arthritis   . Hyperlipidemia   . Hypertension   . Neuromuscular disorder   . Stroke   . GERD (gastroesophageal reflux disease)   . Headache(784.0)     occasional  migraine   . Heart attack 2011; 1990's  . Type II diabetes mellitus   . Peripheral neuropathy   . Anxiety   . Depression   . Dialysis patient     M, W, F; [place] (09/19/11)  . Chronic kidney disease     Hemo MWF    Medications:  Scheduled:  . amLODipine  10 mg Oral Daily  . aspirin  325 mg Oral QPM  . [START ON 12/14/2013] atorvastatin  20 mg Oral q1800  . [START ON 12/14/2013] calcium carbonate  1 tablet Oral TID WC  . famotidine  10 mg Oral Daily  . heparin  4,000 Units Intravenous Once  . insulin aspart  0-5 Units Subcutaneous QHS  . [START ON 12/14/2013] insulin aspart  0-9 Units Subcutaneous TID WC  . multivitamin  1 tablet Oral QHS  . sodium chloride  3 mL Intravenous Q12H  . sodium chloride  3 mL Intravenous Q12H    Assessment: 78 yr old male with PMH of CVA,DM.ESRD on HD, HTN, MI, presents to the ED complaining about feeling weak, pain in leg, Chest pain with vomiting. He has wounds on his legs and a headache like the one he had when he had a stroke. He had missed his HD the day before.  Goal of Therapy:   Heparin level 0.3-0.7 units/ml Monitor platelets by anticoagulation protocol: Yes   Plan:  Heparin bolus 4000 units, then 1300 units/hr.  Heparin level and CBC  Daily with AM labs.  Minta Balsam 12/13/2013,7:57 PM

## 2013-12-13 NOTE — ED Notes (Signed)
Pt from home. C/o chest tightness, missed dialysis, and wound to left calf. Left leg is having involuntary movements. Missed dialysis yesterday due to weakness.

## 2013-12-13 NOTE — Progress Notes (Signed)
Patient's troponin trended up to 0.55.  Dr. Haroldine Laws called and made aware.  Call MD if troponin trends to 3 or greater.  Will continue to monitor patient.  Thanks.  Earleen Reaper RN-BC, [employer]

## 2013-12-13 NOTE — Progress Notes (Signed)
CRITICAL VALUE ALERT  Critical value received: Troponin= 0.55  Date of notification:  12/13/13  Time of notification:  2110  Critical value read back:Yes.    Nurse who received alert:  Rosealee Albee  MD notified (1st page):  Dr. Caryn Section text paged  Time of first page:  2128  MD notified (2nd page): Dr. Missy Sabins  Time of second page:   Responding MD:  Dr. Marybelle Killings  Time MD responded:  2230

## 2013-12-13 NOTE — H&P (Addendum)
[facility] History and Physical  Sean [name] XHB:[id number] DOB: 02/27/1936 DOA: 12/13/2013  Referring physician: ED physician PCP: Philis Fendt, MD   Chief Complaint: Malaise, chest pain  HPI:  Sean [name] is a 78yo man with PMH of calciphylaxis, CVA in 2013, DM, HLD, ESRD on HD MWF, HTN, MI with stents in 2011.  Patient reports that for about 2 days he has been feeling weak with worsening pain in his left leg.  He further notes decreased PO intake, nausea with intake and vomiting for about 2 days.  He reports his water intake is okay.  Further symptoms include chest pain, pressure like with trying to eat and chest pain with vomiting.  He is not very mobile due to leg wounds, but the pain seems mostly to occur when vomiting.  He further has a slight headache in the left occiput.  He reports that this headache happened last time he had a stroke.  He missed HD on Friday due to the holiday, but has not felt more SOB.  He has chronic wounds on LLE due to calciphylaxis.    Assessment and Plan: Malaise and fatigue - Unclear reason at this time, possibly due to missed dialysis, possibly due to chest pain - He does have an anemia, but last blood work in system over 1.5 years ago.  - BC sent, he does not make urine - Monitor on floor, if fever, re culture and start Abx.   Chest pain - Seems atypical, given relation to food - Start acid reducer - Reviewed EKG and now with LBBB, old EKG on 02/20/12 was only a partial block - Given new EKG findings and mildly elevated TnI and BNP elevation, consulted cardiology - Heparin drip for now - Cycle troponin - Zofran for nausea - Full liquid diet as patient would like to try and eat, NPO p midnight in case of procedure  End stage renal disease - Missed HD on Friday - BUN/Cr not severely elevated and K is 4.0 - Consult Renal for HD while in hospital  HYPERTENSION - Continue home meds as patient can take PO.     DM2 - SSI    Radiological Exams on Admission: Dg Chest Port 1 View  12/13/2013   CLINICAL DATA:  Left-sided chest pressure  EXAM: PORTABLE CHEST - 1 VIEW  COMPARISON:  02/20/2012  FINDINGS: There is no focal parenchymal opacity, pleural effusion, or pneumothorax. The heart and mediastinal contours are unremarkable. There is thoracic aortic atherosclerosis.  There is mild cardiomegaly.  The osseous structures are unremarkable.  IMPRESSION: No active disease.   Electronically Signed   By: Kathreen Devoid   On: 12/13/2013 14:33     Code Status: Full Family Communication: Pt and daughter at bedside Disposition Plan: Admit for further evaluation     Review of Systems:  Constitutional: Negative for fever, chills. Positive for malaise HENT: Negative for hearing loss, ear pain, nosebleeds, congestion, sore throat, neck pain, tinnitus and ear discharge.   Eyes: Negative for blurred vision, double vision, photophobia, pain, discharge and redness.  Respiratory: Negative for cough, hemoptysis, sputum production, shortness of breath, wheezing and stridor.   Cardiovascular: Positive for chest pain negative for palpitations, orthopnea, claudication and leg swelling.  Gastrointestinal: Positive for nausea, vomiting Negative for abdominal pain. Negative for heartburn, constipation, blood in stool and melena.  Genitourinary: does not make urine  Musculoskeletal: Negative for myalgias, joint pain and falls.  Positive for Skin: Negative for itching and rash.  Neurological: Negative  for dizziness and weakness. Negative for tingling, tremors, sensory change, speech change, focal weakness, loss of consciousness and headaches.  Endo/Heme/Allergies: Negative for environmental allergies and polydipsia. Does not bruise/bleed easily.  Psychiatric/Behavioral: Negative for suicidal ideas. The patient is not nervous/anxious.     Reviewed with patient Past Medical History  Diagnosis Date  . Hyperparathyroidism   . Arthritis   .  Hyperlipidemia   . Hypertension   . Neuromuscular disorder   . Stroke   . GERD (gastroesophageal reflux disease)   . Headache(784.0)     occasional  migraine   . Heart attack 2011; 1990's  . Type II diabetes mellitus   . Peripheral neuropathy   . Anxiety   . Depression   . Dialysis patient     M, W, F; [place] (09/19/11)  . Chronic kidney disease     Hemo MWF    Past Surgical History  Procedure Laterality Date  . Colon surgery  2011    colon resection   . Parathyroidectomy  2011  . Capd insertion  09/12/2011    Procedure: LAPAROSCOPIC INSERTION CONTINUOUS AMBULATORY PERITONEAL DIALYSIS  (CAPD) CATHETER;  Surgeon: Edward Jolly, MD;  Location: [facility];  Service: General;  Laterality: N/A;  . Sp av dialysis shunt access existing *l*  2003  . Dg av dialysis shunt intro ndl*r* or      "not working" (09/19/11)  . Capd removal  03/12/2012    Procedure: CONTINUOUS AMBULATORY PERITONEAL DIALYSIS  (CAPD) CATHETER REMOVAL;  Surgeon: Edward Jolly, MD;  Location: [facility];  Service: General;  Laterality: N/A;    Social History:  reports that he quit smoking about 37 years ago. His smoking use included Cigarettes. He has a 1.75 pack-year smoking history. He has never used smokeless tobacco. He reports that he drinks alcohol. He reports that he does not use illicit drugs.  Allergies  Allergen Reactions  . Benazepril Hcl Hives    Family History  Problem Relation Age of Onset  . Cancer Sister     breast  . Cancer Brother     lung  . Anesthesia problems Neg Hx   . Hypotension Neg Hx   . Malignant hyperthermia Neg Hx   . Pseudochol deficiency Neg Hx     Prior to Admission medications   Medication Sig Start Date End Date Taking? Authorizing Provider  amLODipine (NORVASC) 10 MG tablet Take 10 mg by mouth daily.   Yes Historical Provider, MD  aspirin 325 MG tablet Take 325 mg by mouth every evening.   Yes Historical Provider, MD  atorvastatin (LIPITOR) 20 MG tablet Take  1 tablet (20 mg total) by mouth daily at 6 PM. 09/22/11 12/13/13 Yes Charlynne Cousins, MD  b complex-vitamin c-folic acid (NEPHRO-VITE) 0.8 MG TABS Take 0.8 mg by mouth at bedtime.   Yes Historical Provider, MD  calcium carbonate (TUMS - DOSED IN MG ELEMENTAL CALCIUM) 500 MG chewable tablet Chew 1 tablet by mouth 3 (three) times daily with meals.   Yes Historical Provider, MD  HYDROcodone-acetaminophen (NORCO/VICODIN) 5-325 MG per tablet Take 1 tablet by mouth every 6 (six) hours as needed for moderate pain.   Yes Historical Provider, MD  hydrOXYzine (ATARAX/VISTARIL) 10 MG tablet Take 10 mg by mouth at bedtime as needed. For sleep   Yes Historical Provider, MD  OVER THE COUNTER MEDICATION Take 60 mLs by mouth daily. "Fruit of the Spirit."   Yes Historical Provider, MD    Physical Exam: Filed Vitals:  12/13/13 1745 12/13/13 1800 12/13/13 1815 12/13/13 1907  BP: 169/76 155/83 163/74 160/93  Pulse: 109 100 100 103  Temp:    98.5 F (36.9 C)  TempSrc:    Oral  Resp: 19 23 19 20   Height:      Weight:      SpO2: 92% 95% 93% 90%    Physical Exam  Constitutional: Appears well-developed and well-nourished. No distress.  HENT: Normocephalic. Oropharynx is clear and moist.  Eyes: no scleral icterus.   CVS: RRR, S1/S2 +, no murmurs Pulmonary: Effort and breath sounds normal, no stridor Abdominal: Soft. BS +,  no distension, tenderness, rebound or guarding.  Musculoskeletal: No edema and no tenderness.  left AVF with thrill and bruit, no pain at site Lymphadenopathy: No lymphadenopathy noted, cervical, inguinal. Neuro: Alert. Normal strength and muscle tone. No cranial nerve deficit. Skin: Skin is warm and dry. Large wounds from calciphylaxis noted on left leg  Psychiatric: Normal mood and affect. Behavior, judgment, thought content normal.   Labs on Admission:  Basic Metabolic Panel:  Recent Labs Lab 12/13/13 1407  NA 137  K 4.0  CL 92*  CO2 26  GLUCOSE 129*  BUN 33*  CREATININE  8.79*  CALCIUM 9.1   Liver Function Tests:  Recent Labs Lab 12/13/13 1407  AST 17  ALT 8  ALKPHOS 93  BILITOT 0.9  PROT 8.1  ALBUMIN 2.7*   CBC:  Recent Labs Lab 12/13/13 1407  WBC 8.5  NEUTROABS 6.5  HGB 9.6*  HCT 29.8*  MCV 84.7  PLT 312    EKG: LBBB  Gilles Chiquito, MD  [facility] Pager [phone]  If 7PM-7AM, please contact night-coverage [email or web address] Password [facility] 12/13/2013, 7:24 PM

## 2013-12-14 ENCOUNTER — Observation Stay (HOSPITAL_COMMUNITY): Payer: Medicare Other

## 2013-12-14 DIAGNOSIS — Z992 Dependence on renal dialysis: Secondary | ICD-10-CM

## 2013-12-14 DIAGNOSIS — R0789 Other chest pain: Principal | ICD-10-CM

## 2013-12-14 DIAGNOSIS — R079 Chest pain, unspecified: Secondary | ICD-10-CM

## 2013-12-14 LAB — CBC
HCT: 31.1 % — ABNORMAL LOW (ref 39.0–52.0)
Hemoglobin: 10.1 g/dL — ABNORMAL LOW (ref 13.0–17.0)
MCH: 27.4 pg (ref 26.0–34.0)
MCHC: 32.5 g/dL (ref 30.0–36.0)
MCV: 84.3 fL (ref 78.0–100.0)
Platelets: 313 10*3/uL (ref 150–400)
RBC: 3.69 MIL/uL — ABNORMAL LOW (ref 4.22–5.81)
RDW: 14.1 % (ref 11.5–15.5)
WBC: 7.1 10*3/uL (ref 4.0–10.5)

## 2013-12-14 LAB — GLUCOSE, CAPILLARY
Glucose-Capillary: 109 mg/dL — ABNORMAL HIGH (ref 70–99)
Glucose-Capillary: 129 mg/dL — ABNORMAL HIGH (ref 70–99)
Glucose-Capillary: 151 mg/dL — ABNORMAL HIGH (ref 70–99)

## 2013-12-14 LAB — HEMOGLOBIN A1C
Hgb A1c MFr Bld: 6.3 % — ABNORMAL HIGH (ref <5.7)
Mean Plasma Glucose: 134 mg/dL — ABNORMAL HIGH (ref <117)

## 2013-12-14 LAB — HEPARIN LEVEL (UNFRACTIONATED)
HEPARIN UNFRACTIONATED: 0.32 [IU]/mL (ref 0.30–0.70)
Heparin Unfractionated: <0.1 IU/mL — ABNORMAL LOW (ref 0.30–0.70)

## 2013-12-14 LAB — TROPONIN I
TROPONIN I: 0.53 ng/mL — AB (ref <0.30)
Troponin I: 0.5 ng/mL (ref <0.30)

## 2013-12-14 LAB — HEPATITIS B SURFACE ANTIGEN: Hepatitis B Surface Ag: NEGATIVE

## 2013-12-14 MED ORDER — MORPHINE SULFATE 4 MG/ML IJ SOLN
INTRAMUSCULAR | Status: AC
  Filled 2013-12-14: qty 1

## 2013-12-14 MED ORDER — HEPARIN SODIUM (PORCINE) 5000 UNIT/ML IJ SOLN
5000.0000 [IU] | Freq: Three times a day (TID) | INTRAMUSCULAR | Status: DC
Start: 2013-12-14 — End: 2013-12-16
  Administered 2013-12-14 – 2013-12-16 (×5): 5000 [IU] via SUBCUTANEOUS
  Filled 2013-12-14 (×8): qty 1

## 2013-12-14 MED ORDER — TECHNETIUM TC 99M SESTAMIBI - CARDIOLITE
10.0000 | Freq: Once | INTRAVENOUS | Status: AC | PRN
  Administered 2013-12-14: 10:00:00 10 via INTRAVENOUS

## 2013-12-14 MED ORDER — HYDROCODONE-ACETAMINOPHEN 5-325 MG PO TABS
ORAL_TABLET | ORAL | Status: AC
  Filled 2013-12-14: qty 1

## 2013-12-14 MED ORDER — TECHNETIUM TC 99M SESTAMIBI - CARDIOLITE
30.0000 | Freq: Once | INTRAVENOUS | Status: AC | PRN
  Administered 2013-12-14: 11:00:00 30 via INTRAVENOUS

## 2013-12-14 MED ORDER — IPRATROPIUM-ALBUTEROL 0.5-2.5 (3) MG/3ML IN SOLN: 3.0000 mL | RESPIRATORY_TRACT | Status: DC | PRN

## 2013-12-14 MED ORDER — REGADENOSON 0.4 MG/5ML IV SOLN
INTRAVENOUS | Status: AC
  Filled 2013-12-14: qty 5

## 2013-12-14 MED ORDER — REGADENOSON 0.4 MG/5ML IV SOLN
0.4000 mg | Freq: Once | INTRAVENOUS | Status: DC
  Filled 2013-12-14: qty 5

## 2013-12-14 MED ORDER — IPRATROPIUM-ALBUTEROL 0.5-2.5 (3) MG/3ML IN SOLN
3.0000 mL | Freq: Once | RESPIRATORY_TRACT | Status: AC
  Administered 2013-12-14: 3 mL via RESPIRATORY_TRACT
  Filled 2013-12-14: qty 3

## 2013-12-14 MED ORDER — REGADENOSON 0.4 MG/5ML IV SOLN
0.4000 mg | Freq: Once | INTRAVENOUS | Status: AC
  Administered 2013-12-14: 0.4 mg via INTRAVENOUS
  Filled 2013-12-14: qty 5

## 2013-12-14 MED ORDER — COLLAGENASE 250 UNIT/GM EX OINT
TOPICAL_OINTMENT | Freq: Every day | CUTANEOUS | Status: DC
  Administered 2013-12-14 – 2013-12-16 (×3): via TOPICAL
  Filled 2013-12-14: qty 30

## 2013-12-14 NOTE — Progress Notes (Signed)
Lexiscan CL performed 

## 2013-12-14 NOTE — Progress Notes (Signed)
Patient ID: JR MILLIRON, male   DOB: 09/24/1935, 78 y.o.   MRN: [id number]   Patient Name: Sean [name] Date of Encounter: 12/14/2013     Active Problems:   HYPERTENSION   End stage renal disease   Malaise and fatigue   Chest pain    SUBJECTIVE  "My leg hurts", denies chest pain or sob.  CURRENT MEDS . amLODipine  10 mg Oral Daily  . aspirin  325 mg Oral QPM  . atorvastatin  20 mg Oral q1800  . calcium carbonate  1 tablet Oral TID WC  . famotidine  10 mg Oral Daily  . insulin aspart  0-5 Units Subcutaneous QHS  . insulin aspart  0-9 Units Subcutaneous TID WC  . ipratropium-albuterol  3 mL Nebulization Once  . multivitamin  1 tablet Oral QHS  . sodium chloride  3 mL Intravenous Q12H  . sodium chloride  3 mL Intravenous Q12H    OBJECTIVE  Filed Vitals:   12/14/13 0207 12/14/13 0225 12/14/13 0515 12/14/13 0658  BP:   169/99   Pulse:   96   Temp:   98.5 F (36.9 C)   TempSrc:   Oral   Resp:   18 18  Height:      Weight:      SpO2: 90% 93% 95% 94%    Intake/Output Summary (Last 24 hours) at 12/14/13 0743 Last data filed at 12/14/13 0600  Gross per 24 hour  Intake    151 ml  Output      0 ml  Net    151 ml   Filed Weights   12/13/13 1347  Weight: 202 lb 13.2 oz (92 kg)    PHYSICAL EXAM  General: Pleasant, NAD. Neuro: Alert and oriented X 3. Moves all extremities spontaneously. Psych: Normal affect. HEENT:  Normal  Neck: Supple without bruits or JVD. Lungs:  Resp regular and unlabored, CTA. Heart: RRR no s3, s4, or murmurs.Split S2 Abdomen: Soft, non-tender, non-distended, BS + x 4.  Extremities: No clubbing, cyanosis or edema. DP/PT/Radials 2+ and equal bilaterally. Left leg with multiple sores.  Accessory Clinical Findings  CBC  Recent Labs  12/13/13 1407 12/14/13 0021  WBC 8.5 7.1  NEUTROABS 6.5  --   HGB 9.6* 10.1*  HCT 29.8* 31.1*  MCV 84.7 84.3  PLT 312 101   Basic Metabolic Panel  Recent Labs  12/13/13 1407  NA 137  K  4.0  CL 92*  CO2 26  GLUCOSE 129*  BUN 33*  CREATININE 8.79*  CALCIUM 9.1   Liver Function Tests  Recent Labs  12/13/13 1407  AST 17  ALT 8  ALKPHOS 93  BILITOT 0.9  PROT 8.1  ALBUMIN 2.7*   No results found for this basename: LIPASE, AMYLASE,  in the last 72 hours Cardiac Enzymes  Recent Labs  12/13/13 1952 12/14/13 0021 12/14/13 0530  TROPONINI 0.55* 0.50* 0.53*   BNP No components found with this basename: POCBNP,  D-Dimer No results found for this basename: DDIMER,  in the last 72 hours Hemoglobin A1C No results found for this basename: HGBA1C,  in the last 72 hours Fasting Lipid Panel No results found for this basename: CHOL, HDL, LDLCALC, TRIG, CHOLHDL, LDLDIRECT,  in the last 72 hours Thyroid Function Tests No results found for this basename: TSH, T4TOTAL, FREET3, T3FREE, THYROIDAB,  in the last 72 hours  TELE NSR with LBBB  Radiology/Studies  Ct Head Wo Contrast  12/13/2013   CLINICAL DATA:  Headache.  EXAM: CT HEAD WITHOUT CONTRAST  TECHNIQUE: Contiguous axial images were obtained from the base of the skull through the vertex without intravenous contrast.  COMPARISON:  09/19/2011  FINDINGS: Old left parietal infarct with encephalomalacia. Diffuse cerebral atrophy and severe chronic microvascular changes throughout the deep white matter. No acute infarction. No mass lesion. No hemorrhage or hydrocephalus.  Visualized paranasal sinuses and mastoids clear. Orbital soft tissues unremarkable. No acute calvarial abnormality.  IMPRESSION: Old left posterior parietal infarct with encephalomalacia.  Atrophy, chronic small vessel disease.  No acute intracranial abnormality.   Electronically Signed   By: Rolm Baptise M.D.   On: 12/13/2013 21:07   Dg Chest Port 1 View  12/13/2013   CLINICAL DATA:  Left-sided chest pressure  EXAM: PORTABLE CHEST - 1 VIEW  COMPARISON:  02/20/2012  FINDINGS: There is no focal parenchymal opacity, pleural effusion, or pneumothorax. The heart  and mediastinal contours are unremarkable. There is thoracic aortic atherosclerosis.  There is mild cardiomegaly.  The osseous structures are unremarkable.  IMPRESSION: No active disease.   Electronically Signed   By: Kathreen Devoid   On: 12/13/2013 14:33    ASSESSMENT AND PLAN  1. Chest pain with borderline troponin 2. ESRD 3. HTN 4. Severe left leg pain Rec: Cardiac markers remain minimally elevated and I suspect chronically so with ESRD. He has preserved LV function. Will plan stress test. Carleene Overlie Ruby Logiudice,M.D.  Leannah Guse,M.D.  12/14/2013 7:43 AM

## 2013-12-14 NOTE — Consult Note (Addendum)
Renal Service Consult Note [place] 12/14/2013 Sean [name] Requesting Physician:  Dr Daryll Drown  Reason for Consult:  ESRD pt with malaise, N/V and chest pain HPI: The patient is a 78 y.o. year-old with hx of ESRD, DM, HTN, CAD w last cath in 2011 and recurrent calciphylaxis of the LE's.  He presented to ED with fatigue, malaise, poor appetite, losing wt, N/V x 2 days and some chest pain. He was admitted. CXR showed IS edema, WBC 7.1, Hb 10 and plt's normal.  Troponins 0.5 and flat, head CT (for HA's) showed old CVA on L. Cardiology started IV hep. Today he is weak , doesn't feel good. Missed last HD on Friday, "too weak to go".    ROS  no fever, +SOB, no abd pain   +CP as above, headaches  lives alone  has son and daughters in [place]  no confusion  no jt pain  no focal weakness  Past Medical History  Past Medical History  Diagnosis Date  . Hyperparathyroidism   . Arthritis   . Hyperlipidemia   . Hypertension   . Neuromuscular disorder   . Stroke   . GERD (gastroesophageal reflux disease)   . Headache(784.0)     occasional  migraine   . Heart attack 2011; 1990's  . Type II diabetes mellitus   . Peripheral neuropathy   . Anxiety   . Depression   . Dialysis patient     M, W, F; [place] (09/19/11)  . Chronic kidney disease     Hemo MWF   Past Surgical History  Past Surgical History  Procedure Laterality Date  . Colon surgery  2011    colon resection   . Parathyroidectomy  2011  . Capd insertion  09/12/2011    Procedure: LAPAROSCOPIC INSERTION CONTINUOUS AMBULATORY PERITONEAL DIALYSIS  (CAPD) CATHETER;  Surgeon: Edward Jolly, MD;  Location: [place];  Service: General;  Laterality: N/A;  . Sp av dialysis shunt access existing *l*  2003  . Dg av dialysis shunt intro ndl*r* or      "not working" (09/19/11)  . Capd removal  03/12/2012    Procedure: CONTINUOUS AMBULATORY PERITONEAL DIALYSIS  (CAPD) CATHETER REMOVAL;  Surgeon:  Edward Jolly, MD;  Location: [facility];  Service: General;  Laterality: N/A;   Family History  Family History  Problem Relation Age of Onset  . Cancer Sister     breast  . Cancer Brother     lung  . Anesthesia problems Neg Hx   . Hypotension Neg Hx   . Malignant hyperthermia Neg Hx   . Pseudochol deficiency Neg Hx    Social History  reports that he quit smoking about 37 years ago. His smoking use included Cigarettes. He has a 1.75 pack-year smoking history. He has never used smokeless tobacco. He reports that he drinks alcohol. He reports that he does not use illicit drugs. Allergies  Allergies  Allergen Reactions  . Benazepril Hcl Hives   Home medications Prior to Admission medications   Medication Sig Start Date End Date Taking? Authorizing Provider  amLODipine (NORVASC) 10 MG tablet Take 10 mg by mouth daily.   Yes Historical Provider, MD  aspirin 325 MG tablet Take 325 mg by mouth every evening.   Yes Historical Provider, MD  atorvastatin (LIPITOR) 20 MG tablet Take 1 tablet (20 mg total) by mouth daily at 6 PM. 09/22/11 12/13/13 Yes Charlynne Cousins, MD  b complex-vitamin c-folic acid (  NEPHRO-VITE) 0.8 MG TABS Take 0.8 mg by mouth at bedtime.   Yes Historical Provider, MD  calcium carbonate (TUMS - DOSED IN MG ELEMENTAL CALCIUM) 500 MG chewable tablet Chew 1 tablet by mouth 3 (three) times daily with meals.   Yes Historical Provider, MD  HYDROcodone-acetaminophen (NORCO/VICODIN) 5-325 MG per tablet Take 1 tablet by mouth every 6 (six) hours as needed for moderate pain.   Yes Historical Provider, MD  hydrOXYzine (ATARAX/VISTARIL) 10 MG tablet Take 10 mg by mouth at bedtime as needed. For sleep   Yes Historical Provider, MD  OVER THE COUNTER MEDICATION Take 60 mLs by mouth daily. "Fruit of the Spirit."   Yes Historical Provider, MD   Liver Function Tests  Recent Labs Lab 12/13/13 1407  AST 17  ALT 8  ALKPHOS 93  BILITOT 0.9  PROT 8.1  ALBUMIN 2.7*   No results found  for this basename: LIPASE, AMYLASE,  in the last 168 hours CBC  Recent Labs Lab 12/13/13 1407 12/14/13 0021  WBC 8.5 7.1  NEUTROABS 6.5  --   HGB 9.6* 10.1*  HCT 29.8* 31.1*  MCV 84.7 84.3  PLT 312 354   Basic Metabolic Panel  Recent Labs Lab 12/13/13 1407  NA 137  K 4.0  CL 92*  CO2 26  GLUCOSE 129*  BUN 33*  CREATININE 8.79*  CALCIUM 9.1    Filed Vitals:   12/14/13 0207 12/14/13 0225 12/14/13 0515 12/14/13 0658  BP:   169/99   Pulse:   96   Temp:   98.5 F (36.9 C)   TempSrc:   Oral   Resp:   18 18  Height:      Weight:      SpO2: 90% 93% 95% 94%   Exam: Older adult male, not in distress No rash, cyanosis or gangrene Sclera anicteric, throat clear JVP 12 cm Chest rales L base, R clear RRR 2/6 SEM no RG Abd obese, soft, NTND, no ascites No LE or UE edema No carotid or fem bruits L forearm AVF good bruit, no lesions L lower leg diffuse dermal eschar in serpentine pattern Neuro is alert, Ox 3, nf  HD: MWF [place] 4h   450/A1.5   94kg   LFA AVG   Heparin 9800 No meds Lab: 25% tsat, 1371 ferr, 740 pth, 4.5 phos  Assessment: 1 Chest pain / new LBBB- cardiology evaluating 2 Malaise / anorexia / nausea- has been losing weight 3 Calciphylaxis- recurrent prob for > 10 years 4 CAD last cath 2011 5 HTN 6 Hx CVA 7 DM2 8 HPTH hx parathyroidectomy 9 Volume- slightly SOB w IS edema on CXR, BP up, missed last HD   Plan- HD today and again tomorrow, remove fluid as tolerated, lower dry wt   [name] MD (pgr) [phone]    [phone] 12/14/2013, 8:18 AM

## 2013-12-14 NOTE — Progress Notes (Signed)
[facility] PROGRESS NOTE  Sean [name] JHE:[id number] DOB: 1936-04-14 DOA: 12/13/2013 PCP: Philis Fendt, MD  Brief narrative: Sean [name] is a 78yo man with PMH of ESRD with HD MWF, calciphylaxis, HLD, DM, h/o CVA, HTN, h/o MI with stents who presented with malaise and some atypical chest pain.   Assessment/Plan Malaise and fatigue  - Afebrile, has had some tachycardia and elevated BP, with some SOB and low oxygenation on pulse ox.   CXR with edema.  - BC pending - if fever, re culture and start Abx.  - He also has a headache and was worried about new stroke, CT head did not show anything acute.   Atypical Chest pain  - Pepcid started, he reported to me no further episodes.  - CE mildly elevated and did not change with cycle - Cardiology following - Having stress test today  End stage renal disease  - Missed HD on Friday, renal following - HD today  HYPERTENSION  - Continue home medications.    DM2  - SSI for now   Consultants:  Cardiology  Renal  Procedures/Studies: Ct Head Wo Contrast  12/13/2013   CLINICAL DATA:  Headache.  EXAM: CT HEAD WITHOUT CONTRAST  TECHNIQUE: Contiguous axial images were obtained from the base of the skull through the vertex without intravenous contrast.  COMPARISON:  09/19/2011  FINDINGS: Old left parietal infarct with encephalomalacia. Diffuse cerebral atrophy and severe chronic microvascular changes throughout the deep white matter. No acute infarction. No mass lesion. No hemorrhage or hydrocephalus.  Visualized paranasal sinuses and mastoids clear. Orbital soft tissues unremarkable. No acute calvarial abnormality.  IMPRESSION: Old left posterior parietal infarct with encephalomalacia.  Atrophy, chronic small vessel disease.  No acute intracranial abnormality.   Electronically Signed   By: Rolm Baptise M.D.   On: 12/13/2013 21:07   Dg Chest Port 1 View  12/14/2013   CLINICAL DATA:  Shortness of breath  EXAM: PORTABLE CHEST - 1 VIEW   COMPARISON:  12/13/2013  FINDINGS: Cardiac shadow is stable. The lungs are well aerated bilaterally. Mild interstitial changes are seen without focal confluent infiltrate. No bony abnormality is noted.  IMPRESSION: No change from the prior exam.   Electronically Signed   By: Inez Catalina M.D.   On: 12/14/2013 08:00   Dg Chest Port 1 View  12/13/2013   CLINICAL DATA:  Left-sided chest pressure  EXAM: PORTABLE CHEST - 1 VIEW  COMPARISON:  02/20/2012  FINDINGS: There is no focal parenchymal opacity, pleural effusion, or pneumothorax. The heart and mediastinal contours are unremarkable. There is thoracic aortic atherosclerosis.  There is mild cardiomegaly.  The osseous structures are unremarkable.  IMPRESSION: No active disease.   Electronically Signed   By: Kathreen Devoid   On: 12/13/2013 14:33      Antibiotics:  None now  Code Status: Full Family Communication: Pt at bedside Disposition Plan: Home when medically stable  HPI/Subjective: Had some decreased oxygen saturation overnight, placed on O2.  Having some worsened SOB today, improved with breathing treatment.   Objective: Filed Vitals:   12/14/13 1059 12/14/13 1301 12/14/13 1316 12/14/13 1330  BP: 154/64 133/78 136/83 131/85  Pulse:  100 104 100  Temp:  98.4 F (36.9 C)    TempSrc:  Oral    Resp:  19    Height:      Weight:  202 lb 13.2 oz (92 kg)    SpO2:  99%      Intake/Output Summary (Last 24 hours) at  12/14/13 1348 Last data filed at 12/14/13 0600  Gross per 24 hour  Intake    151 ml  Output      0 ml  Net    151 ml    Exam:   General:  Pt is alert, follows commands appropriately, not in acute distress  Cardiovascular: Regular rate and rhythm, S1/S2  Respiratory: Course breath sounds, some crackles at bases.   Abdomen: Soft, non tender, non distended, bowel sounds present, no guarding  Extremities: No edema noted, calciphylaxis wound on lle covered with kerlex, no drainage on bandage.   Neuro: Grossly  nonfocal  Data Reviewed: Basic Metabolic Panel:  Recent Labs Lab 12/13/13 1407  NA 137  K 4.0  CL 92*  CO2 26  GLUCOSE 129*  BUN 33*  CREATININE 8.79*  CALCIUM 9.1   Liver Function Tests:  Recent Labs Lab 12/13/13 1407  AST 17  ALT 8  ALKPHOS 93  BILITOT 0.9  PROT 8.1  ALBUMIN 2.7*   No results found for this basename: LIPASE, AMYLASE,  in the last 168 hours No results found for this basename: AMMONIA,  in the last 168 hours CBC:  Recent Labs Lab 12/13/13 1407 12/14/13 0021  WBC 8.5 7.1  NEUTROABS 6.5  --   HGB 9.6* 10.1*  HCT 29.8* 31.1*  MCV 84.7 84.3  PLT 312 313   Cardiac Enzymes:  Recent Labs Lab 12/13/13 1952 12/14/13 0021 12/14/13 0530  TROPONINI 0.55* 0.50* 0.53*   BNP: No components found with this basename: POCBNP,  CBG:  Recent Labs Lab 12/13/13 1904 12/13/13 2128 12/14/13 0804  GLUCAP 103* 141* 109*    Recent Results (from the past 240 hour(s))  CULTURE, BLOOD (ROUTINE X 2)     Status: None   Collection Time    12/13/13  2:40 PM      Result Value Ref Range Status   Specimen Description BLOOD RIGHT ARM   Final   Special Requests BOTTLES DRAWN AEROBIC AND ANAEROBIC 10CC   Final   Culture  Setup Time     Final   Value: 12/13/2013 19:49     Performed at [facility]   Culture     Final   Value:        BLOOD CULTURE RECEIVED NO GROWTH TO DATE CULTURE WILL BE HELD FOR 5 DAYS BEFORE ISSUING A FINAL NEGATIVE REPORT     Performed at [facility]   Report Status PENDING   Incomplete  CULTURE, BLOOD (ROUTINE X 2)     Status: None   Collection Time    12/13/13  2:45 PM      Result Value Ref Range Status   Specimen Description BLOOD RIGHT HAND   Final   Special Requests BOTTLES DRAWN AEROBIC ONLY 5CC   Final   Culture  Setup Time     Final   Value: 12/13/2013 19:49     Performed at [facility]   Culture     Final   Value:        BLOOD CULTURE RECEIVED NO GROWTH TO DATE CULTURE WILL BE HELD FOR 5 DAYS  BEFORE ISSUING A FINAL NEGATIVE REPORT     Performed at [facility]   Report Status PENDING   Incomplete     Scheduled Meds: . amLODipine  10 mg Oral Daily  . aspirin  325 mg Oral QPM  . atorvastatin  20 mg Oral q1800  . calcium carbonate  1 tablet Oral TID  WC  . collagenase   Topical Daily  . famotidine  10 mg Oral Daily  . HYDROcodone-acetaminophen      . insulin aspart  0-5 Units Subcutaneous QHS  . insulin aspart  0-9 Units Subcutaneous TID WC  . morphine      . multivitamin  1 tablet Oral QHS  . regadenoson      . sodium chloride  3 mL Intravenous Q12H  . sodium chloride  3 mL Intravenous Q12H   Continuous Infusions:    Gilles Chiquito, MD  [place] Pager [phone]  If 7PM-7AM, please contact night-coverage [email or web address] Password [email or web address] 12/14/2013, 1:48 PM   LOS: 1 day

## 2013-12-14 NOTE — Consult Note (Signed)
WOC wound consult note Reason for Consult:Chronic calciphylaxis. Patient complains of pain in this area that is a 6.  Bedside RN in room and will medicate. Wound type: Inflammatory, systemic disease related Pressure Ulcer POA: No Measurement:14cm x 13.5cm.  Unable to determine depth due to the presence of stable, dry, black eschar that is firmly adherent. Wound bed:100% obscured by the presence of stable,dry, black eschar that is firmly adherent Drainage (amount, consistency, odor) None Periwound:Intact with evidence of previous wound healing (scarring). Dressing procedure/placement/frequency: I have reviewed the POC established by Dr. Elesa Hacker and continued by the outpatient wound care center at [facility] and will continue their intervention while in house.  Topical wound care with an enzymatic debriding agent, collagenase (Santyl) is ordered for once daily.  Additionally, I have provided a Prevalon boot to the left LE which will provide some comfort in addition to prevention heel pressure ulcers in this at-risk extremity. Patient should follow up as scheduled with both Dr. Lindon Romp and the staff at the outpatient wound care center at [facility] upon discharge, regardless of disposition (home of SNF). [place] nursing team will not follow, but will remain available to this patient, the nursing and medical team.  Please re-consult if needed. Thanks, Maudie Flakes, MSN, RN, [place], [place], [place] [phone])

## 2013-12-14 NOTE — Progress Notes (Signed)
ANTICOAGULATION CONSULT NOTE - Follow Up Consult  Pharmacy Consult for heparin Indication: NSTEMI  Labs:  Recent Labs  12/13/13 1407 12/13/13 1952 12/14/13 0021 12/14/13 0530  HGB 9.6*  --  10.1*  --   HCT 29.8*  --  31.1*  --   PLT 312  --  313  --   HEPARINUNFRC  --   --   --  0.32  CREATININE 8.79*  --   --   --   TROPONINI  --  0.55* 0.50*  --     Assessment: 78yo male therapeutic on heparin with initial dosing for NSTEMI though at very low end of goal.  Goal of Therapy:  Heparin level 0.3-0.7 units/ml   Plan:  Will increase heparin gtt slightly to 1400 units/hr and check level in [place], PharmD, BCPS  12/14/2013,6:32 AM

## 2013-12-14 NOTE — Procedures (Signed)
I was present at this dialysis session, have reviewed the session itself and made  appropriate changes  [name] MD (pgr) [phone]    [phone] 12/14/2013, 1:53 PM

## 2013-12-15 DIAGNOSIS — D649 Anemia, unspecified: Secondary | ICD-10-CM | POA: Diagnosis present

## 2013-12-15 DIAGNOSIS — N186 End stage renal disease: Secondary | ICD-10-CM | POA: Diagnosis present

## 2013-12-15 DIAGNOSIS — R000 Tachycardia, unspecified: Secondary | ICD-10-CM | POA: Diagnosis present

## 2013-12-15 DIAGNOSIS — I252 Old myocardial infarction: Secondary | ICD-10-CM | POA: Diagnosis not present

## 2013-12-15 DIAGNOSIS — I5020 Unspecified systolic (congestive) heart failure: Secondary | ICD-10-CM | POA: Diagnosis present

## 2013-12-15 DIAGNOSIS — L988 Other specified disorders of the skin and subcutaneous tissue: Secondary | ICD-10-CM | POA: Diagnosis not present

## 2013-12-15 DIAGNOSIS — Z79899 Other long term (current) drug therapy: Secondary | ICD-10-CM | POA: Diagnosis not present

## 2013-12-15 DIAGNOSIS — I10 Essential (primary) hypertension: Secondary | ICD-10-CM

## 2013-12-15 DIAGNOSIS — L97909 Non-pressure chronic ulcer of unspecified part of unspecified lower leg with unspecified severity: Secondary | ICD-10-CM | POA: Diagnosis present

## 2013-12-15 DIAGNOSIS — I120 Hypertensive chronic kidney disease with stage 5 chronic kidney disease or end stage renal disease: Secondary | ICD-10-CM | POA: Diagnosis present

## 2013-12-15 DIAGNOSIS — R079 Chest pain, unspecified: Secondary | ICD-10-CM | POA: Diagnosis present

## 2013-12-15 DIAGNOSIS — R0789 Other chest pain: Secondary | ICD-10-CM | POA: Diagnosis present

## 2013-12-15 DIAGNOSIS — I2510 Atherosclerotic heart disease of native coronary artery without angina pectoris: Secondary | ICD-10-CM | POA: Diagnosis present

## 2013-12-15 DIAGNOSIS — Z8673 Personal history of transient ischemic attack (TIA), and cerebral infarction without residual deficits: Secondary | ICD-10-CM | POA: Diagnosis not present

## 2013-12-15 DIAGNOSIS — I517 Cardiomegaly: Secondary | ICD-10-CM

## 2013-12-15 DIAGNOSIS — E1142 Type 2 diabetes mellitus with diabetic polyneuropathy: Secondary | ICD-10-CM | POA: Diagnosis present

## 2013-12-15 DIAGNOSIS — E1159 Type 2 diabetes mellitus with other circulatory complications: Secondary | ICD-10-CM | POA: Diagnosis not present

## 2013-12-15 DIAGNOSIS — M79609 Pain in unspecified limb: Secondary | ICD-10-CM | POA: Diagnosis not present

## 2013-12-15 DIAGNOSIS — I509 Heart failure, unspecified: Secondary | ICD-10-CM | POA: Diagnosis present

## 2013-12-15 DIAGNOSIS — R5381 Other malaise: Secondary | ICD-10-CM | POA: Diagnosis present

## 2013-12-15 DIAGNOSIS — L97409 Non-pressure chronic ulcer of unspecified heel and midfoot with unspecified severity: Secondary | ICD-10-CM | POA: Diagnosis not present

## 2013-12-15 DIAGNOSIS — Z7982 Long term (current) use of aspirin: Secondary | ICD-10-CM | POA: Diagnosis not present

## 2013-12-15 DIAGNOSIS — Z992 Dependence on renal dialysis: Secondary | ICD-10-CM | POA: Diagnosis not present

## 2013-12-15 DIAGNOSIS — I447 Left bundle-branch block, unspecified: Secondary | ICD-10-CM | POA: Diagnosis present

## 2013-12-15 DIAGNOSIS — E1149 Type 2 diabetes mellitus with other diabetic neurological complication: Secondary | ICD-10-CM | POA: Diagnosis present

## 2013-12-15 DIAGNOSIS — E785 Hyperlipidemia, unspecified: Secondary | ICD-10-CM | POA: Diagnosis present

## 2013-12-15 DIAGNOSIS — Z87891 Personal history of nicotine dependence: Secondary | ICD-10-CM | POA: Diagnosis not present

## 2013-12-15 DIAGNOSIS — I2584 Coronary atherosclerosis due to calcified coronary lesion: Secondary | ICD-10-CM | POA: Diagnosis present

## 2013-12-15 LAB — GLUCOSE, CAPILLARY
GLUCOSE-CAPILLARY: 106 mg/dL — AB (ref 70–99)
GLUCOSE-CAPILLARY: 182 mg/dL — AB (ref 70–99)
Glucose-Capillary: 128 mg/dL — ABNORMAL HIGH (ref 70–99)
Glucose-Capillary: 138 mg/dL — ABNORMAL HIGH (ref 70–99)

## 2013-12-15 LAB — PHOSPHORUS: PHOSPHORUS: 2.9 mg/dL (ref 2.3–4.6)

## 2013-12-15 MED ORDER — MORPHINE SULFATE 2 MG/ML IJ SOLN
INTRAMUSCULAR | Status: AC
  Administered 2013-12-15: 1 mg via INTRAVENOUS_CENTRAL
  Filled 2013-12-15: qty 1

## 2013-12-15 NOTE — Progress Notes (Signed)
Patient seen and examined and chart reviewed. Agree with note as stated above by Rosaria Ferries PA-C.  He has been having intermittent chest pressure for several weeks.  Now with new LBBB.  Lexiscan myoview showed normal perfusion but now has severely reduced LVF with EF 25%.  I am concerned that he may now have severe 3 vessel ASCAD with balanced ischemia on myoview resulting in reduced EF.  I have recommended proceeding with diagnostic left heart cath to evaluate coronary anatomy.  He has eaten breakfast and is getting HD this afternoon.  Will plan left heart cath tomorrow.  Cardiac catheterization was discussed with the patient fully including risks on myocardial infarction, death, stroke, bleeding, arrhythmia, dye allergy, renal insufficiency or bleeding.  All patient questions and concerns were discussed and the patient understands and is willing to proceed.

## 2013-12-15 NOTE — Evaluation (Signed)
Physical Therapy Evaluation Patient Details Name: Sean [name] MRN: [id number] DOB: Jul 23, 1935 Today's Date: 12/15/2013   History of Present Illness  Admitted with atypical chest pain,malaise and fatigue.  He missed HD last friday due to not being able to get ready fast enough.  Evaluation proceeding  Clinical Impression  Pt admitted with/for above stated problems.  Pt currently limited functionally due to the problems listed. ( See problems list.)   Pt will benefit from PT to maximize function and safety in order to get ready for next venue listed below.     Follow Up Recommendations SNF;Other (comment) (needs PCA assist if to go home with HHPT)    Equipment Recommendations  Other (comment) (TBA)    Recommendations for Other Services       Precautions / Restrictions Precautions Precautions: Fall      Mobility  Bed Mobility Overal bed mobility: Needs Assistance Bed Mobility: Supine to Sit     Supine to sit: Min guard     General bed mobility comments: Used momentum to "bound " up.  Transfers Overall transfer level: Needs assistance Equipment used: Straight cane Transfers: Sit to/from Stand Sit to Stand: Min guard         General transfer comment: safe technique  Ambulation/Gait Ambulation/Gait assistance: Min guard Ambulation Distance (Feet): 20 Feet Assistive device: Straight cane Gait Pattern/deviations: Antalgic;Step-through pattern Gait velocity: slow   General Gait Details: mildly unsteady due to L LE pain  Stairs            Wheelchair Mobility    Modified Rankin (Stroke Patients Only)       Balance Overall balance assessment: No apparent balance deficits (not formally assessed)                                           Pertinent Vitals/Pain Pain in left leg chronic from calciphylaxis    Home Living Family/patient expects to be discharged to:: Private residence Living Arrangements: Alone Available Help at  Discharge:  (has not been able to find reliable PCA assist) Type of Home: House Home Access: Stairs to enter     Home Layout: One level Home Equipment: Cane - single point;Other (comment) (TBA further)      Prior Function Level of Independence: Independent with assistive device(s)               Hand Dominance        Extremity/Trunk Assessment               Lower Extremity Assessment: Generalized weakness;LLE deficits/detail   LLE Deficits / Details: painful so did not fully test. grossly >3+/5     Communication   Communication: No difficulties  Cognition Arousal/Alertness: Awake/alert Behavior During Therapy: WFL for tasks assessed/performed Overall Cognitive Status: Within Functional Limits for tasks assessed                      General Comments      Exercises        Assessment/Plan    PT Assessment Patient needs continued PT services  PT Diagnosis Difficulty walking;Acute pain;Generalized weakness   PT Problem List Decreased strength;Decreased activity tolerance;Decreased mobility;Decreased knowledge of use of DME;Pain  PT Treatment Interventions DME instruction;Gait training;Functional mobility training;Therapeutic activities;Patient/family education   PT Goals (Current goals can be found in the Care Plan section) Acute Rehab PT Goals Patient  Stated Goal: be able to do for myself at home PT Goal Formulation: With patient Time For Goal Achievement: 12/29/13 Potential to Achieve Goals: Good    Frequency Min 3X/week   Barriers to discharge Decreased caregiver support      Co-evaluation               End of Session   Activity Tolerance: Patient tolerated treatment well;Patient limited by pain Patient left: in chair;with call bell/phone within reach Nurse Communication: Mobility status         Time: 1735-1800 PT Time Calculation (min): 25 min   Charges:   PT Evaluation $Initial PT Evaluation Tier I: 1 Procedure PT  Treatments $Gait Training: 8-22 mins   PT G Codes:          Kiyanna Biegler, Tessie Fass 12/15/2013, 6:29 PM 12/15/2013  Donnella Sham, PT [phone]  (pager)

## 2013-12-15 NOTE — Progress Notes (Signed)
Assessment:  1 Chest pain / new LBBB- cardiology evaluating  2 Malaise / anorexia / nausea- has been losing weight  3 Calciphylaxis, LLE- recurrent prob for > 10 years  4 CAD last cath 2011  5 HTN  6 Hx CVA  7 DM2  8 HPTH hx parathyroidectomy  9 Volume- slightly SOB w edema on CXR, BP up, missed last HD  Plan- HD today, remove fluid as tolerated, check phos and PTH level,ask PT to see due to decreased ROM at knee  Subjective: Interval History: c/o diff sleeping, pain in leg, fatigue  Objective: Vital signs in last 24 hours: Temp:  [97.8 F (36.6 C)-98.8 F (37.1 C)] 98.6 F (37 C) (07/06 0500) Pulse Rate:  [60-104] 103 (07/06 0500) Resp:  [18-19] 18 (07/06 0500) BP: (101-171)/(64-85) 171/76 mmHg (07/06 0500) SpO2:  [91 %-99 %] 93 % (07/06 0500) Weight:  [90.2 kg (198 lb 13.7 oz)-92 kg (202 lb 13.2 oz)] 90.2 kg (198 lb 13.7 oz) (07/05 2100) Weight change: 0 kg (0 lb)  Intake/Output from previous day: 07/05 0701 - 07/06 0700 In: -  Out: 1700  Intake/Output this shift: Total I/O In: 240 [P.O.:240] Out: -   General appearance: alert and cooperative Back: negative, symmetric, no curvature. ROM normal. No CVA tenderness. Resp: clear to auscultation bilaterally GI: soft, non-tender; bowel sounds normal; no masses,  no organomegaly Extremities: lle wrapped with peeling skin and flexion contraction at knee  Lab Results:  Recent Labs  12/13/13 1407 12/14/13 0021  WBC 8.5 7.1  HGB 9.6* 10.1*  HCT 29.8* 31.1*  PLT 312 313   BMET:  Recent Labs  12/13/13 1407  NA 137  K 4.0  CL 92*  CO2 26  GLUCOSE 129*  BUN 33*  CREATININE 8.79*  CALCIUM 9.1   No results found for this basename: PTH,  in the last 72 hours Iron Studies: No results found for this basename: IRON, TIBC, TRANSFERRIN, FERRITIN,  in the last 72 hours Studies/Results: Ct Head Wo Contrast  12/13/2013   CLINICAL DATA:  Headache.  EXAM: CT HEAD WITHOUT CONTRAST  TECHNIQUE: Contiguous axial images were  obtained from the base of the skull through the vertex without intravenous contrast.  COMPARISON:  09/19/2011  FINDINGS: Old left parietal infarct with encephalomalacia. Diffuse cerebral atrophy and severe chronic microvascular changes throughout the deep white matter. No acute infarction. No mass lesion. No hemorrhage or hydrocephalus.  Visualized paranasal sinuses and mastoids clear. Orbital soft tissues unremarkable. No acute calvarial abnormality.  IMPRESSION: Old left posterior parietal infarct with encephalomalacia.  Atrophy, chronic small vessel disease.  No acute intracranial abnormality.   Electronically Signed   By: Rolm Baptise M.D.   On: 12/13/2013 21:07   Nm Myocar Multi W/spect W/wall Motion / Ef  12/14/2013   CLINICAL DATA:  78 year old with current history of hypertension and end-stage renal disease on hemodialysis, presenting this admission with chest pain and mildly elevated cardiac enzymes.  EXAM: MYOCARDIAL IMAGING WITH SPECT (REST AND PHARMACOLOGIC-STRESS)  GATED LEFT VENTRICULAR WALL MOTION STUDY  LEFT VENTRICULAR EJECTION FRACTION  TECHNIQUE: Standard myocardial SPECT imaging was performed after resting intravenous injection of 10 mCi Tc-10m sestamibi. Subsequently, intravenous infusion of Lexiscan was performed under the supervision of the Cardiology staff. At peak effect of the drug, 30 mCi Tc-13m sestamibi was injected intravenously and standard myocardial SPECT imaging was performed. Quantitative gated imaging was also performed to evaluate left ventricular wall motion, and estimate left ventricular ejection fraction.  COMPARISON:  None.  FINDINGS: Immediate post regadenoson images demonstrated diminished activity in the inferior wall, anteroapical wall and apicoseptal wall. Initial resting images demonstrate similar findings. No evidence of reversibility to suggest ischemia. Findings confirmed by the computer generated polar map.  Gated images demonstrate global hypokinesis with  essential akinesis involving the inferior wall.  Estimated QGS left ventricular ejection fraction measured 25%, with an end-diastolic volume of 209 ml and an end systolic volume of 470 ml.  IMPRESSION: 1. Prior MI involving the inferior wall and apex. 2. No evidence of myocardial ischemia. 3. Left ventricular enlargement. 4. Global hypokinesis with essential akinesis in the inferior wall. 5. Estimated QGS ejection fraction 25%.   Electronically Signed   By: Evangeline Dakin M.D.   On: 12/14/2013 14:43   Dg Chest Port 1 View  12/14/2013   CLINICAL DATA:  Shortness of breath  EXAM: PORTABLE CHEST - 1 VIEW  COMPARISON:  12/13/2013  FINDINGS: Cardiac shadow is stable. The lungs are well aerated bilaterally. Mild interstitial changes are seen without focal confluent infiltrate. No bony abnormality is noted.  IMPRESSION: No change from the prior exam.   Electronically Signed   By: Inez Catalina M.D.   On: 12/14/2013 08:00   Dg Chest Port 1 View  12/13/2013   CLINICAL DATA:  Left-sided chest pressure  EXAM: PORTABLE CHEST - 1 VIEW  COMPARISON:  02/20/2012  FINDINGS: There is no focal parenchymal opacity, pleural effusion, or pneumothorax. The heart and mediastinal contours are unremarkable. There is thoracic aortic atherosclerosis.  There is mild cardiomegaly.  The osseous structures are unremarkable.  IMPRESSION: No active disease.   Electronically Signed   By: Kathreen Devoid   On: 12/13/2013 14:33   Scheduled: . amLODipine  10 mg Oral Daily  . aspirin  325 mg Oral QPM  . atorvastatin  20 mg Oral q1800  . calcium carbonate  1 tablet Oral TID WC  . collagenase   Topical Daily  . famotidine  10 mg Oral Daily  . heparin subcutaneous  5,000 Units Subcutaneous 3 times per day  . insulin aspart  0-5 Units Subcutaneous QHS  . insulin aspart  0-9 Units Subcutaneous TID WC  . multivitamin  1 tablet Oral QHS  . sodium chloride  3 mL Intravenous Q12H  . sodium chloride  3 mL Intravenous Q12H     LOS: 2 days    Anival Pasha C 12/15/2013,9:26 AM

## 2013-12-15 NOTE — Progress Notes (Signed)
Utilization Review Completed.Sean [name] T7/11/2013  

## 2013-12-15 NOTE — Progress Notes (Signed)
[facility] PROGRESS NOTE  STANLY SI NLZ:[id number] DOB: 22-Mar-1936 DOA: 12/13/2013 PCP: Philis Fendt, MD- Pt states he no longer goes to him  Brief narrative: Sean [name] is a 78yo man with PMH of ESRD with HD MWF, calciphylaxis, HLD, DM, h/o CVA, HTN, h/o MI with stents who presented with malaise and some atypical chest pain.   HPI/Subjective: States cardiology is recommending a cath but wants to talk to his son about it. Mainly concerned about his left leg ulcers and severe pain- states pain has improved on the current pain medications and last night was the first night he slept.   Assessment/Plan Malaise and fatigue  - Afebrile, has had some tachycardia and elevated BP, with some SOB and low oxygenation on pulse ox.   CXR with edema.  - BC negative - He also has a headache and was worried about new stroke, CT head did not show anything acute.   Chest pain  - CE mildly elevated and did not change with cycle - Cardiology following -negative stress test- will go for cath today  End stage renal disease  - Missed HD on Friday, renal following  HYPERTENSION  - Continue home medications.    DM2  - SSI for now  Calciphylaxis- severe on left leg - cont vicodin PRN which appears to be working - goes to wound care as outpt - he states he also has nurses come to his home for dressings   Consultants:  Cardiology  Renal  Procedures/Studies: Ct Head Wo Contrast  12/13/2013   CLINICAL DATA:  Headache.  EXAM: CT HEAD WITHOUT CONTRAST  TECHNIQUE: Contiguous axial images were obtained from the base of the skull through the vertex without intravenous contrast.  COMPARISON:  09/19/2011  FINDINGS: Old left parietal infarct with encephalomalacia. Diffuse cerebral atrophy and severe chronic microvascular changes throughout the deep white matter. No acute infarction. No mass lesion. No hemorrhage or hydrocephalus.  Visualized paranasal sinuses and mastoids clear. Orbital soft  tissues unremarkable. No acute calvarial abnormality.  IMPRESSION: Old left posterior parietal infarct with encephalomalacia.  Atrophy, chronic small vessel disease.  No acute intracranial abnormality.   Electronically Signed   By: Rolm Baptise M.D.   On: 12/13/2013 21:07   Nm Myocar Multi W/spect W/wall Motion / Ef  12/14/2013   CLINICAL DATA:  78 year old with current history of hypertension and end-stage renal disease on hemodialysis, presenting this admission with chest pain and mildly elevated cardiac enzymes.  EXAM: MYOCARDIAL IMAGING WITH SPECT (REST AND PHARMACOLOGIC-STRESS)  GATED LEFT VENTRICULAR WALL MOTION STUDY  LEFT VENTRICULAR EJECTION FRACTION  TECHNIQUE: Standard myocardial SPECT imaging was performed after resting intravenous injection of 10 mCi Tc-46m sestamibi. Subsequently, intravenous infusion of Lexiscan was performed under the supervision of the Cardiology staff. At peak effect of the drug, 30 mCi Tc-6m sestamibi was injected intravenously and standard myocardial SPECT imaging was performed. Quantitative gated imaging was also performed to evaluate left ventricular wall motion, and estimate left ventricular ejection fraction.  COMPARISON:  None.  FINDINGS: Immediate post regadenoson images demonstrated diminished activity in the inferior wall, anteroapical wall and apicoseptal wall. Initial resting images demonstrate similar findings. No evidence of reversibility to suggest ischemia. Findings confirmed by the computer generated polar map.  Gated images demonstrate global hypokinesis with essential akinesis involving the inferior wall.  Estimated QGS left ventricular ejection fraction measured 25%, with an end-diastolic volume of 902 ml and an end systolic volume of 409 ml.  IMPRESSION: 1. Prior MI involving the inferior  wall and apex. 2. No evidence of myocardial ischemia. 3. Left ventricular enlargement. 4. Global hypokinesis with essential akinesis in the inferior wall. 5. Estimated QGS  ejection fraction 25%.   Electronically Signed   By: Evangeline Dakin M.D.   On: 12/14/2013 14:43   Dg Chest Port 1 View  12/14/2013   CLINICAL DATA:  Shortness of breath  EXAM: PORTABLE CHEST - 1 VIEW  COMPARISON:  12/13/2013  FINDINGS: Cardiac shadow is stable. The lungs are well aerated bilaterally. Mild interstitial changes are seen without focal confluent infiltrate. No bony abnormality is noted.  IMPRESSION: No change from the prior exam.   Electronically Signed   By: Inez Catalina M.D.   On: 12/14/2013 08:00   Dg Chest Port 1 View  12/13/2013   CLINICAL DATA:  Left-sided chest pressure  EXAM: PORTABLE CHEST - 1 VIEW  COMPARISON:  02/20/2012  FINDINGS: There is no focal parenchymal opacity, pleural effusion, or pneumothorax. The heart and mediastinal contours are unremarkable. There is thoracic aortic atherosclerosis.  There is mild cardiomegaly.  The osseous structures are unremarkable.  IMPRESSION: No active disease.   Electronically Signed   By: Kathreen Devoid   On: 12/13/2013 14:33     Antibiotics: Anti-infectives   None      Code Status: Full Family Communication: none Disposition Plan: Home when medically stable    Objective: Filed Vitals:   12/14/13 1708 12/14/13 2100 12/15/13 0500 12/15/13 1000  BP: 141/83 101/68 171/76 159/84  Pulse: 60 100 103 76  Temp: 98.3 F (36.8 C) 98.8 F (37.1 C) 98.6 F (37 C) 98.2 F (36.8 C)  TempSrc: Oral Oral Oral Oral  Resp: 18 18 18 18   Height:      Weight:  90.2 kg (198 lb 13.7 oz)    SpO2: 91% 95% 93% 100%    Intake/Output Summary (Last 24 hours) at 12/15/13 1358 Last data filed at 12/15/13 4166  Gross per 24 hour  Intake    240 ml  Output   1700 ml  Net  -1460 ml    Exam:   General:  Pt is alert, follows commands appropriately, not in acute distress  Cardiovascular: Regular rate and rhythm, S1/S2  Respiratory: CTA b/l   Abdomen: Soft, non tender, non distended, bowel sounds present, no guarding  Extremities: No  edema noted, calciphylaxis wound on lle covered with kerlex, no drainage on bandage.   Neuro: Grossly nonfocal  Data Reviewed: Basic Metabolic Panel:  Recent Labs Lab 12/13/13 1407  NA 137  K 4.0  CL 92*  CO2 26  GLUCOSE 129*  BUN 33*  CREATININE 8.79*  CALCIUM 9.1   Liver Function Tests:  Recent Labs Lab 12/13/13 1407  AST 17  ALT 8  ALKPHOS 93  BILITOT 0.9  PROT 8.1  ALBUMIN 2.7*   No results found for this basename: LIPASE, AMYLASE,  in the last 168 hours No results found for this basename: AMMONIA,  in the last 168 hours CBC:  Recent Labs Lab 12/13/13 1407 12/14/13 0021  WBC 8.5 7.1  NEUTROABS 6.5  --   HGB 9.6* 10.1*  HCT 29.8* 31.1*  MCV 84.7 84.3  PLT 312 313   Cardiac Enzymes:  Recent Labs Lab 12/13/13 1952 12/14/13 0021 12/14/13 0530  TROPONINI 0.55* 0.50* 0.53*   BNP: No components found with this basename: POCBNP,  CBG:  Recent Labs Lab 12/14/13 0804 12/14/13 1706 12/14/13 2220 12/15/13 0737 12/15/13 1213  GLUCAP 109* 129* 151* 106* 128*  Recent Results (from the past 240 hour(s))  CULTURE, BLOOD (ROUTINE X 2)     Status: None   Collection Time    12/13/13  2:40 PM      Result Value Ref Range Status   Specimen Description BLOOD RIGHT ARM   Final   Special Requests BOTTLES DRAWN AEROBIC AND ANAEROBIC 10CC   Final   Culture  Setup Time     Final   Value: 12/13/2013 19:49     Performed at [facility]   Culture     Final   Value:        BLOOD CULTURE RECEIVED NO GROWTH TO DATE CULTURE WILL BE HELD FOR 5 DAYS BEFORE ISSUING A FINAL NEGATIVE REPORT     Performed at [facility]   Report Status PENDING   Incomplete  CULTURE, BLOOD (ROUTINE X 2)     Status: None   Collection Time    12/13/13  2:45 PM      Result Value Ref Range Status   Specimen Description BLOOD RIGHT HAND   Final   Special Requests BOTTLES DRAWN AEROBIC ONLY 5CC   Final   Culture  Setup Time     Final   Value: 12/13/2013 19:49      Performed at [facility]   Culture     Final   Value:        BLOOD CULTURE RECEIVED NO GROWTH TO DATE CULTURE WILL BE HELD FOR 5 DAYS BEFORE ISSUING A FINAL NEGATIVE REPORT     Performed at [facility]   Report Status PENDING   Incomplete     Scheduled Meds: . amLODipine  10 mg Oral Daily  . aspirin  325 mg Oral QPM  . atorvastatin  20 mg Oral q1800  . calcium carbonate  1 tablet Oral TID WC  . collagenase   Topical Daily  . famotidine  10 mg Oral Daily  . heparin subcutaneous  5,000 Units Subcutaneous 3 times per day  . insulin aspart  0-5 Units Subcutaneous QHS  . insulin aspart  0-9 Units Subcutaneous TID WC  . multivitamin  1 tablet Oral QHS  . sodium chloride  3 mL Intravenous Q12H  . sodium chloride  3 mL Intravenous Q12H   Continuous Infusions:    Debbe Odea, MD  [facility]  If 7PM-7AM, please contact night-coverage [email or web address] Password [email or web address] 12/15/2013, 1:58 PM   LOS: 2 days

## 2013-12-15 NOTE — Progress Notes (Signed)
Attempted to have pt sign consent for cardiac cath in am. Pt states he is "not really wanting to do this procedure," and that he "would prefer to have this procedure outpatient by my doctor." Pt did not sign consent form. Placed form in chart, passed on to oncoming shift.

## 2013-12-15 NOTE — Progress Notes (Signed)
Patient Name: Sean [name] Date of Encounter: 12/15/2013  Active Problems:   HYPERTENSION   End stage renal disease   Malaise and fatigue   Chest pain    Patient Profile: 78 yo male w/ hx CAD s/p NSTEMI and failed RCA PCI in 2011, CVA in 2013, DM, HLD, ESRD on HD MWF, HTN who is admitted for calciphylaxis. We are consulted for atypical CP and new LBBB. Lexiscan MV on 07/05 showed EF 25%, no ischemia  SUBJECTIVE: Occasional chest pain, possibly related to exertion or stress, only twinges in the hospital. Main problem is pain control due to leg and HA, and lack of sleep. BP drops sometimes during HD.  OBJECTIVE Filed Vitals:   12/14/13 1500 12/14/13 1708 12/14/13 2100 12/15/13 0500  BP: 120/80 141/83 101/68 171/76  Pulse: 100 60 100 103  Temp: 97.8 F (36.6 C) 98.3 F (36.8 C) 98.8 F (37.1 C) 98.6 F (37 C)  TempSrc: Oral Oral Oral Oral  Resp: 19 18 18 18   Height:      Weight: 198 lb 13.7 oz (90.2 kg)  198 lb 13.7 oz (90.2 kg)   SpO2: 99% 91% 95% 93%    Intake/Output Summary (Last 24 hours) at 12/15/13 0810 Last data filed at 12/14/13 1500  Gross per 24 hour  Intake      0 ml  Output   1700 ml  Net  -1700 ml   Filed Weights   12/14/13 1301 12/14/13 1500 12/14/13 2100  Weight: 202 lb 13.2 oz (92 kg) 198 lb 13.7 oz (90.2 kg) 198 lb 13.7 oz (90.2 kg)    PHYSICAL EXAM General: Well developed, well nourished, male in no acute distress. Head: Normocephalic, atraumatic.  Neck: Supple without bruits, JVD at 10 cm. Lungs:  Resp regular and unlabored, decreased BS bases. Heart: RRR, S1, S2, no S3, S4, or murmur; no rub. Abdomen: Soft, non-tender, non-distended, BS + x 4.  Extremities: No clubbing, cyanosis, no edema. Wound LLE bandaged and dressed, not draining Neuro: Alert and oriented X 3. Moves all extremities spontaneously. Psych: Normal affect.  LABS: CBC: Recent Labs  12/13/13 1407 12/14/13 0021  WBC 8.5 7.1  NEUTROABS 6.5  --   HGB 9.6* 10.1*    HCT 29.8* 31.1*  MCV 84.7 84.3  PLT 312 010   Basic Metabolic Panel: Recent Labs  12/13/13 1407  NA 137  K 4.0  CL 92*  CO2 26  GLUCOSE 129*  BUN 33*  CREATININE 8.79*  CALCIUM 9.1   Liver Function Tests: Recent Labs  12/13/13 1407  AST 17  ALT 8  ALKPHOS 93  BILITOT 0.9  PROT 8.1  ALBUMIN 2.7*   Cardiac Enzymes: Recent Labs  12/13/13 1952 12/14/13 0021 12/14/13 0530  TROPONINI 0.55* 0.50* 0.53*    Recent Labs  12/13/13 1453  TROPIPOC 0.42*   BNP: Pro B Natriuretic peptide (BNP)  Date/Time Value Ref Range Status  12/13/2013  2:08 PM >70000.0* 0 - 450 pg/mL Final   Hemoglobin A1C: Recent Labs  12/14/13 0021  HGBA1C 6.3*   TELE:  SR, ST with PVCs, had ?afib but has also had bigeminy PACs and artifact makes it difficult to tell, strips printed.  Radiology/Studies: Ct Head Wo Contrast 12/13/2013   CLINICAL DATA:  Headache.  EXAM: CT HEAD WITHOUT CONTRAST  TECHNIQUE: Contiguous axial images were obtained from the base of the skull through the vertex without intravenous contrast.  COMPARISON:  09/19/2011  FINDINGS: Old left parietal infarct with  encephalomalacia. Diffuse cerebral atrophy and severe chronic microvascular changes throughout the deep white matter. No acute infarction. No mass lesion. No hemorrhage or hydrocephalus.  Visualized paranasal sinuses and mastoids clear. Orbital soft tissues unremarkable. No acute calvarial abnormality.  IMPRESSION: Old left posterior parietal infarct with encephalomalacia.  Atrophy, chronic small vessel disease.  No acute intracranial abnormality.   Electronically Signed   By: Rolm Baptise M.D.   On: 12/13/2013 21:07   Nm Myocar Multi W/spect W/wall Motion / Ef 12/14/2013   CLINICAL DATA:  78 year old with current history of hypertension and end-stage renal disease on hemodialysis, presenting this admission with chest pain and mildly elevated cardiac enzymes.  EXAM: MYOCARDIAL IMAGING WITH SPECT (REST AND  PHARMACOLOGIC-STRESS)  GATED LEFT VENTRICULAR WALL MOTION STUDY  LEFT VENTRICULAR EJECTION FRACTION  TECHNIQUE: Standard myocardial SPECT imaging was performed after resting intravenous injection of 10 mCi Tc-51m sestamibi. Subsequently, intravenous infusion of Lexiscan was performed under the supervision of the Cardiology staff. At peak effect of the drug, 30 mCi Tc-12m sestamibi was injected intravenously and standard myocardial SPECT imaging was performed. Quantitative gated imaging was also performed to evaluate left ventricular wall motion, and estimate left ventricular ejection fraction.  COMPARISON:  None.  FINDINGS: Immediate post regadenoson images demonstrated diminished activity in the inferior wall, anteroapical wall and apicoseptal wall. Initial resting images demonstrate similar findings. No evidence of reversibility to suggest ischemia. Findings confirmed by the computer generated polar map.  Gated images demonstrate global hypokinesis with essential akinesis involving the inferior wall.  Estimated QGS left ventricular ejection fraction measured 25%, with an end-diastolic volume of 382 ml and an end systolic volume of 505 ml.  IMPRESSION: 1. Prior MI involving the inferior wall and apex. 2. No evidence of myocardial ischemia. 3. Left ventricular enlargement. 4. Global hypokinesis with essential akinesis in the inferior wall. 5. Estimated QGS ejection fraction 25%.   Electronically Signed   By: Evangeline Dakin M.D.   On: 12/14/2013 14:43   Dg Chest Port 1 View 12/14/2013   CLINICAL DATA:  Shortness of breath  EXAM: PORTABLE CHEST - 1 VIEW  COMPARISON:  12/13/2013  FINDINGS: Cardiac shadow is stable. The lungs are well aerated bilaterally. Mild interstitial changes are seen without focal confluent infiltrate. No bony abnormality is noted.  IMPRESSION: No change from the prior exam.   Electronically Signed   By: Inez Catalina M.D.   On: 12/14/2013 08:00   Dg Chest Port 1 View 12/13/2013   CLINICAL  DATA:  Left-sided chest pressure  EXAM: PORTABLE CHEST - 1 VIEW  COMPARISON:  02/20/2012  FINDINGS: There is no focal parenchymal opacity, pleural effusion, or pneumothorax. The heart and mediastinal contours are unremarkable. There is thoracic aortic atherosclerosis.  There is mild cardiomegaly.  The osseous structures are unremarkable.  IMPRESSION: No active disease.   Electronically Signed   By: Kathreen Devoid   On: 12/13/2013 14:33    Current Medications:  . amLODipine  10 mg Oral Daily  . aspirin  325 mg Oral QPM  . atorvastatin  20 mg Oral q1800  . calcium carbonate  1 tablet Oral TID WC  . collagenase   Topical Daily  . famotidine  10 mg Oral Daily  . heparin subcutaneous  5,000 Units Subcutaneous 3 times per day  . insulin aspart  0-5 Units Subcutaneous QHS  . insulin aspart  0-9 Units Subcutaneous TID WC  . multivitamin  1 tablet Oral QHS  . sodium chloride  3 mL Intravenous Q12H  .  sodium chloride  3 mL Intravenous Q12H      ASSESSMENT AND PLAN: Active Problems:   HYPERTENSION - per IM/Renal teams    End stage renal disease - HD today, per renal    Malaise and fatigue - feels sleep-deprived, poor pain control, really wants help w/ both    Chest pain - Hx CAD, EF 25% at MV but previously 65% by echo in 2013. Cath from 2011 summarized here:    LM - Ca++, but OK   LAD - Ca++ 30-40%   CFX - distal 60-70%   RI - mild dz   RCA - prox/mid 80% focal (NSTEMI culprit), diffuse 80% mid    EF 75% At that time failed PCI w/ PTCA and "poor guide support would prevent any stent implantation"...consider HSRA   No ongoing ischemic symptoms, pt has had breakfast and is for HD today. Discuss w/ MD.  Jonetta Speak , PA-C 8:10 AM 12/15/2013

## 2013-12-15 NOTE — Progress Notes (Signed)
Echocardiogram 2D Echocardiogram has been performed.  Sean [name] 12/15/2013, 12:08 PM

## 2013-12-15 NOTE — Progress Notes (Signed)
[facility]  Patient Status: Active (receiving services up to time of hospitalization)  [facility] is providing the following services: RN and MSW  If patient discharges after hours, please call [phone].   Tomi Bamberger 12/15/2013, 12:48 PM

## 2013-12-16 ENCOUNTER — Encounter (HOSPITAL_BASED_OUTPATIENT_CLINIC_OR_DEPARTMENT_OTHER): Payer: Medicare Other | Attending: General Surgery

## 2013-12-16 ENCOUNTER — Encounter (HOSPITAL_COMMUNITY)
Admission: EM | Disposition: A | Discharge status: Home Health | Payer: Self-pay | Source: Home / Self Care | Attending: Internal Medicine

## 2013-12-16 DIAGNOSIS — L97921 Non-pressure chronic ulcer of unspecified part of left lower leg limited to breakdown of skin: Secondary | ICD-10-CM

## 2013-12-16 DIAGNOSIS — M79605 Pain in left leg: Secondary | ICD-10-CM

## 2013-12-16 DIAGNOSIS — L988 Other specified disorders of the skin and subcutaneous tissue: Secondary | ICD-10-CM | POA: Insufficient documentation

## 2013-12-16 DIAGNOSIS — L97409 Non-pressure chronic ulcer of unspecified heel and midfoot with unspecified severity: Secondary | ICD-10-CM | POA: Insufficient documentation

## 2013-12-16 DIAGNOSIS — I798 Other disorders of arteries, arterioles and capillaries in diseases classified elsewhere: Secondary | ICD-10-CM | POA: Insufficient documentation

## 2013-12-16 DIAGNOSIS — E1159 Type 2 diabetes mellitus with other circulatory complications: Secondary | ICD-10-CM | POA: Insufficient documentation

## 2013-12-16 DIAGNOSIS — L97909 Non-pressure chronic ulcer of unspecified part of unspecified lower leg with unspecified severity: Secondary | ICD-10-CM

## 2013-12-16 DIAGNOSIS — I2510 Atherosclerotic heart disease of native coronary artery without angina pectoris: Secondary | ICD-10-CM | POA: Diagnosis present

## 2013-12-16 DIAGNOSIS — M79609 Pain in unspecified limb: Secondary | ICD-10-CM | POA: Insufficient documentation

## 2013-12-16 LAB — BASIC METABOLIC PANEL
Anion gap: 14 (ref 5–15)
BUN: 17 mg/dL (ref 6–23)
CHLORIDE: 96 meq/L (ref 96–112)
CO2: 28 mEq/L (ref 19–32)
Calcium: 8.9 mg/dL (ref 8.4–10.5)
Creatinine, Ser: 5.32 mg/dL — ABNORMAL HIGH (ref 0.50–1.35)
GFR, EST AFRICAN AMERICAN: 11 mL/min — AB (ref >90)
GFR, EST NON AFRICAN AMERICAN: 9 mL/min — AB (ref >90)
Glucose, Bld: 154 mg/dL — ABNORMAL HIGH (ref 70–99)
POTASSIUM: 3.5 meq/L — AB (ref 3.7–5.3)
SODIUM: 138 meq/L (ref 137–147)

## 2013-12-16 LAB — GLUCOSE, CAPILLARY
GLUCOSE-CAPILLARY: 112 mg/dL — AB (ref 70–99)
GLUCOSE-CAPILLARY: 118 mg/dL — AB (ref 70–99)
Glucose-Capillary: 122 mg/dL — ABNORMAL HIGH (ref 70–99)

## 2013-12-16 LAB — PARATHYROID HORMONE, INTACT (NO CA): PTH: 802.1 pg/mL — ABNORMAL HIGH (ref 14.0–72.0)

## 2013-12-16 LAB — PROTIME-INR
INR: 1.04 (ref 0.00–1.49)
PROTHROMBIN TIME: 13.6 s (ref 11.6–15.2)

## 2013-12-16 SURGERY — LEFT HEART CATHETERIZATION WITH CORONARY ANGIOGRAM
Anesthesia: LOCAL

## 2013-12-16 MED ORDER — CARVEDILOL 3.125 MG PO TABS
3.1250 mg | ORAL_TABLET | Freq: Two times a day (BID) | ORAL | Status: DC
  Administered 2013-12-16: 3.125 mg via ORAL
  Filled 2013-12-16 (×2): qty 1

## 2013-12-16 MED ORDER — CARVEDILOL 3.125 MG PO TABS: 3.1250 mg | ORAL_TABLET | Freq: Two times a day (BID) | ORAL | Status: DC

## 2013-12-16 MED ORDER — ISOSORB DINITRATE-HYDRALAZINE 20-37.5 MG PO TABS: 1.0000 | ORAL_TABLET | Freq: Two times a day (BID) | ORAL | Status: DC

## 2013-12-16 MED ORDER — HYDROCODONE-ACETAMINOPHEN 5-325 MG PO TABS: 1.0000 | ORAL_TABLET | Freq: Four times a day (QID) | ORAL | Status: DC | PRN

## 2013-12-16 MED ORDER — BISACODYL 5 MG PO TBEC
10.0000 mg | DELAYED_RELEASE_TABLET | Freq: Once | ORAL | Status: AC
  Administered 2013-12-16: 10 mg via ORAL
  Filled 2013-12-16: qty 2

## 2013-12-16 MED ORDER — ISOSORB DINITRATE-HYDRALAZINE 20-37.5 MG PO TABS
1.0000 | ORAL_TABLET | Freq: Two times a day (BID) | ORAL | Status: DC
  Administered 2013-12-16: 1 via ORAL
  Filled 2013-12-16 (×2): qty 1

## 2013-12-16 NOTE — Care Management Note (Addendum)
12/16/2013 CM following for d/c needs, noted PT recommendations, please be aware that hospital CM has no resources for "PCA" . If pt needs 24 hr assistance he or his family must make arrangement for this. Will follow for needs, [facility] ware of pt and will continue services when pt is d/c.  Jasmine Pang RN MPH, case manager, [phone]  CARE MANAGEMENT NOTE 12/16/2013  Patient:  Sean [name], Sean [name]   Account Number:  [id number]  Date Initiated:  12/16/2013  Documentation initiated by:  Jasmine Pang  Subjective/Objective Assessment:   Referral for [facility] services     Action/Plan:   12/16/13 following for d/c needs, pt active with [facility] for wound care, PT etc. PT recommending personal care services however pt does not have Medicaid, so he would have to privately hire this care.   Anticipated DC Date:  12/17/2013   Anticipated DC Plan:  [place]         Choice offered to / List presented to:             Status of service:   Medicare Important Message given?   (If response is "NO", the following Medicare IM given date fields will be blank) Date Medicare IM given:   Medicare IM given by:   Date Additional Medicare IM given:   Additional Medicare IM given by:    Discharge Disposition:    Per UR Regulation:    If discussed at Long Length of Stay Meetings, dates discussed:    Comments:  12/16/13 Following for d/c needs, SNF vs HH, PT recommending "PCA" or 24 hr care. Pt would have to make arrangements for 24 hr care privately .  Will continue to follow, spoke with [facility] who will continue [facility], HHPT and social worker as needed. Jasmine Pang RN MPH, case manager, [phone]

## 2013-12-16 NOTE — Progress Notes (Signed)
[facility] Progress Note  Subjective:   Good pain control here for his leg - was finally able to get some sleep. Chest pressure continues but is nothing like chest pain he has had in the past. Prefers to follow up with his outpatient cardiologist.  Objective Filed Vitals:   12/16/13 0023 12/16/13 0103 12/16/13 0541 12/16/13 1000  BP: 116/65 134/86 176/94 163/76  Pulse: 76 95 80 97  Temp: 97.5 F (36.4 C) 97.7 F (36.5 C) 98.3 F (36.8 C) 98.4 F (36.9 C)  TempSrc: Oral   Oral  Resp: 20 17 18 18   Height:      Weight: 87.2 kg (192 lb 3.9 oz)     SpO2: 96% 92% 93% 97%   Physical Exam General: Alert, cooperative, NAD Heart: RRR Lungs: CTA bilat Abdomen: Soft, NT, + BS Extremities: No LE edema. LLE wounds with clean/intact dressing. Two proximal lesions without exudate Dialysis Access: L AVG + bruit  HD: MWF [place]  4h 450/A1.5 94kg LFA AVG Heparin 9800  No meds  Lab: 25% tsat, 1371 ferr, 740 pth, 4.5 phos  Assessment/Plan: 1. CAD/ Chest pain / new LBBB - Last cath 2011. Refuses repeat cath pending resolution of leg pain/wounds. His outpatient cardiologist, Dr. Tamala Julian is aware of admission and will follow him outpatient to arrange cath at a later date. Medical mgmt for now. 2. Malaise / anorexia / nausea - Resolving. Afebrile. CT head and BC's negative. 3. Calciphylaxis - recurrent prob for > 10 years. Has outpt wound care 4. ESRD - MWF, K+ 3.5. HD tomorrow  5. HTN/Vol - SBPs 170s. On norvasc 10. Pain possible contributing. Under EDW. Challenge and lower dry. 6. Anemia - Hgb 10.1, No ESAs. Last Tsat 25%. Follow CBC 7. HPTH  - hx parathyroidectomy. Ca/Phos controlled. On Tums. PTH still pending 8. Nutrition -  Renal diet, multivit, add supplements. Losing weight. 9. Hx CVA  10. DM2     Collene Leyden. Cletus Gash, PA-C [facility] Pager [phone] 12/16/2013,10:43 AM  LOS: 3 days  Renal Attending: He feels better and had a good nights rest.  I agree with  note above as articulated by Ms. Cletus Gash.  [facility] HD in [facility]    Additional Objective Labs: Basic Metabolic Panel:  Recent Labs Lab 12/13/13 1407 12/15/13 2049 12/16/13 0445  NA 137  --  138  K 4.0  --  3.5*  CL 92*  --  96  CO2 26  --  28  GLUCOSE 129*  --  154*  BUN 33*  --  17  CREATININE 8.79*  --  5.32*  CALCIUM 9.1  --  8.9  PHOS  --  2.9  --    Liver Function Tests:  Recent Labs Lab 12/13/13 1407  AST 17  ALT 8  ALKPHOS 93  BILITOT 0.9  PROT 8.1  ALBUMIN 2.7*   CBC:  Recent Labs Lab 12/13/13 1407 12/14/13 0021  WBC 8.5 7.1  NEUTROABS 6.5  --   HGB 9.6* 10.1*  HCT 29.8* 31.1*  MCV 84.7 84.3  PLT 312 313   Blood Culture    Component Value Date/Time   SDES BLOOD RIGHT HAND 12/13/2013 1445   SPECREQUEST BOTTLES DRAWN AEROBIC ONLY 5CC 12/13/2013 1445   CULT  Value:        BLOOD CULTURE RECEIVED NO GROWTH TO DATE CULTURE WILL BE HELD FOR 5 DAYS BEFORE ISSUING A FINAL NEGATIVE REPORT Performed at [facility] 12/13/2013 1445   REPTSTATUS  PENDING 12/13/2013 1445    Cardiac Enzymes:  Recent Labs Lab 12/13/13 1952 12/14/13 0021 12/14/13 0530  TROPONINI 0.55* 0.50* 0.53*   CBG:  Recent Labs Lab 12/15/13 1213 12/15/13 1708 12/15/13 2001 12/16/13 0059 12/16/13 0742  GLUCAP 128* 138* 182* 118* 112*    Studies/Results: Nm Myocar Multi W/spect W/wall Motion / Ef  12/14/2013   CLINICAL DATA:  78 year old with current history of hypertension and end-stage renal disease on hemodialysis, presenting this admission with chest pain and mildly elevated cardiac enzymes.  EXAM: MYOCARDIAL IMAGING WITH SPECT (REST AND PHARMACOLOGIC-STRESS)  GATED LEFT VENTRICULAR WALL MOTION STUDY  LEFT VENTRICULAR EJECTION FRACTION  TECHNIQUE: Standard myocardial SPECT imaging was performed after resting intravenous injection of 10 mCi Tc-12m sestamibi. Subsequently, intravenous infusion of Lexiscan was performed under the supervision of the Cardiology staff. At  peak effect of the drug, 30 mCi Tc-5m sestamibi was injected intravenously and standard myocardial SPECT imaging was performed. Quantitative gated imaging was also performed to evaluate left ventricular wall motion, and estimate left ventricular ejection fraction.  COMPARISON:  None.  FINDINGS: Immediate post regadenoson images demonstrated diminished activity in the inferior wall, anteroapical wall and apicoseptal wall. Initial resting images demonstrate similar findings. No evidence of reversibility to suggest ischemia. Findings confirmed by the computer generated polar map.  Gated images demonstrate global hypokinesis with essential akinesis involving the inferior wall.  Estimated QGS left ventricular ejection fraction measured 25%, with an end-diastolic volume of 182 ml and an end systolic volume of 993 ml.  IMPRESSION: 1. Prior MI involving the inferior wall and apex. 2. No evidence of myocardial ischemia. 3. Left ventricular enlargement. 4. Global hypokinesis with essential akinesis in the inferior wall. 5. Estimated QGS ejection fraction 25%.   Electronically Signed   By: Evangeline Dakin M.D.   On: 12/14/2013 14:43   Medications:   . amLODipine  10 mg Oral Daily  . aspirin  325 mg Oral QPM  . atorvastatin  20 mg Oral q1800  . calcium carbonate  1 tablet Oral TID WC  . collagenase   Topical Daily  . famotidine  10 mg Oral Daily  . heparin subcutaneous  5,000 Units Subcutaneous 3 times per day  . insulin aspart  0-5 Units Subcutaneous QHS  . insulin aspart  0-9 Units Subcutaneous TID WC  . multivitamin  1 tablet Oral QHS  . sodium chloride  3 mL Intravenous Q12H  . sodium chloride  3 mL Intravenous Q12H

## 2013-12-16 NOTE — Progress Notes (Signed)
Attempted to contact patients daughter to notify of discharge, number the daughter left for contact did not have voicemail set up, no answer. Will attempt again later.

## 2013-12-16 NOTE — Consult Note (Signed)
Interventional Cardiology Note  I last saw the patient in 2011 after failed PCI in calcified RCA by Dr. Irish Lack. My prior exposure to him was in 2008, when diagnostic cath was performed.  He present on this occasion with left leg pain and occasional tightness in his chest. Flat TI levels at .5-.6 and LBBB on ECG. Nuclear did not reveal ischemia but evidence of prior inferior MI. WM study with EF 25% and Echo estimates EF 45-50%.  I agree coronary angiography would be helpful but he wants to get the pain and wound in the left leg under control before having further testing performed. He refuses coronary angiography at this time but will consider in future when leg is better. He says he is having very few episodes of chest pain. We discussed in detail that the heart / coronary disease could be critical and that chest pain is our only clinical parameter to guide future decision. He is therefore urged to report chest pain and changes in pattern promptly.  Under the circumstances, I would continue medical therapy, including HF therapy( beta blocker and Bidil) as tolerated by BP and HD hemodynamic.  Okay to discharge and have OP cardiology f/u with me when other issues are resolved.

## 2013-12-16 NOTE — Care Management Note (Signed)
CARE MANAGEMENT NOTE 12/16/2013  Patient:  Sean [name], Sean [name]   Account Number:  [id number]  Date Initiated:  12/16/2013  Documentation initiated by:  Jasmine Pang  Subjective/Objective Assessment:   Referral for [facility] services     Action/Plan:   12/16/13 following for d/c needs, pt active with [facility] for wound care, PT etc. PT recommending personal care services however pt does not have Medicaid, so he would have to privately hire this care.   Anticipated DC Date:  12/17/2013   Anticipated DC Plan:  [place]         Choice offered to / List presented to:             Status of service:   Medicare Important Message given?  YES (If response is "NO", the following Medicare IM given date fields will be blank) Date Medicare IM given:  12/16/2013 Medicare IM given by:  Bosco Paparella Date Additional Medicare IM given:   Additional Medicare IM given by:    Discharge Disposition:    Per UR Regulation:    If discussed at Long Length of Stay Meetings, dates discussed:    Comments:  12/16/13 Following for d/c needs, SNF vs HH, PT recommending "PCA" or 24 hr care. Pt would have to make arrangements for 24 hr care privately .  Will continue to follow, spoke with [facility] who will continue [facility], HHPT and social worker as needed. Jasmine Pang RN MPH, case manager, [phone] addem: Met with pt who states that he needs more help at home, attempted to move pt forward on a decision re D/c needs and asked to have [place] fax out info to SNF for possible short term stay for rehab. Pt wants to talk with children now that he knows that he is being d/c. Unable to get pt to allow search for rehab options to begin until he has talked with his children either late today or tonight. This CM explained to pt that the CSW could find options for his and his children to consider, however he is unwilling to move forward with the process, even when told repeatly that a search would simply provide d/c options and would not  mean that he had to go to any specific rehab facility. Pt is not concerned that he may be d/c, and feels that he is being "pushed" . This CM attempted to help this pt see the need to explore options that he can discuss with this children, however, pt does not want to move forward. Pt RN, Susie, most helpful attempting to help pt understand that he needs to more forward with a d/c plan.   CRoyal RN MPH, case manager, [phone]

## 2013-12-16 NOTE — Discharge Summary (Signed)
Physician Discharge Summary  Sean [name] DJS:[id number] DOB: 10/12/1935 DOA: 12/13/2013  PCP: Philis Fendt, MD- he no longer goes to see Dr Jeanie Cooks and relies on his nephrologist to manage his care  Admit date: 12/13/2013 Discharge date: 12/16/2013  Time spent: >45 minutes  Recommendations for Outpatient Follow-up:  1. Home with home health PT 2. F/u on patient's leg pain on current narcotics (Vicodin)  3. F/u with Cardiology  Discharge Diagnoses:  Principal Problem:   Chest pain Active Problems: CAD   Malaise and fatigue   Calciphylaxis of left lower extremity with nonhealing ulcer, limited to breakdown of skin   Left leg pain- due to calciphylaxis   HYPERTENSION   End stage renal disease  Discharge Condition: stable   Diet recommendation: low sodium, carb modified renal diet  Filed Weights   12/15/13 2003 12/15/13 2049 12/16/13 0023  Weight: 90.2 kg (198 lb 13.7 oz) 90.9 kg (200 lb 6.4 oz) 87.2 kg (192 lb 3.9 oz)    History of present illness:  Mr. Brod is a 78yo man with PMH of calciphylaxis, CVA in 2013, DM, HLD, ESRD on HD MWF, HTN, MI with stents in 2011. Patient reports that for about 2 days he has been feeling weak with worsening pain in his left leg. He further notes decreased PO intake, nausea with intake and vomiting for about 2 days. He reports his water intake is okay. Further symptoms include chest pain, pressure like with trying to eat and chest pain with vomiting. He is not very mobile due to leg wounds, but the pain seems mostly to occur when vomiting. He further has a slight headache in the left occiput. He reports that this headache happened last time he had a stroke. He missed HD on Friday due to the holiday, but has not felt more SOB. He has chronic wounds on LLE due to calciphylaxis.   Hospital Course:  Malaise and fatigue  -  possibly due to missed dialysis in addition to severe leg pain - he feels he has been eating very well since being in the  hospital - finishing most of his meals - Blood cultures negative  Calciphylaxis- severe on left leg  - cont vicodin PRN which appears to be working very well- he is only needed it BID but will prescribe Every 6 hr as needed - goes to wound care as outpt but his pain was not being managed by them - he states he also has nurses come to his home for dressing changes   Chest pain  - EKG and now with LBBB, old EKG on 02/20/12 was only a partial block  - Given new EKG findings and mildly elevated TnI and BNP elevation, consulted cardiology  - stress test was negative - Dr Lajoyce Lauber concerned that he may have worsening CAD as his EF has dropped and recommended a cath however, patient has declined at this time- see note by Dr Tamala Julian today- he will f/u as outpt - cont ASA- was on full dose at home   Systolic CHF - EF dropping from 65-70% in 11/2011 to 45-50 % on 12/15/13 - Dr Tamala Julian recommends starting Bidil and B Blocker- I will d/c his Amlodipine  End stage renal disease  - Missed HD on Friday  - cont MWF dialysis as outpt  HYPERTENSION  See change in BP meds under systolic CHF   DM2  - diet control- A1c 6/3     Procedures:  Stress myoview 12/14/13  Consultations:  Renal  Cardiology  Discharge Exam: Filed Vitals:   12/16/13 1000  BP: 163/76  Pulse: 97  Temp: 98.4 F (36.9 C)  Resp: 18    General: Pt is AAO x 3,  not in acute distress  Cardiovascular: Regular rate and rhythm, S1/S2  Respiratory: CTA b/l  Abdomen: Soft, non tender, non distended, bowel sounds present, no guarding  Extremities: No edema noted, extensive calciphylaxis wounds on left leg- no drainage   Discharge Instructions You were cared for by a hospitalist during your hospital stay. If you have any questions about your discharge medications or the care you received while you were in the hospital after you are discharged, you can call the unit and asked to speak with the hospitalist on call if the hospitalist  that took care of you is not available. Once you are discharged, your primary care physician will handle any further medical issues. Please note that NO REFILLS for any discharge medications will be authorized once you are discharged, as it is imperative that you return to your primary care physician (or establish a relationship with a primary care physician if you do not have one) for your aftercare needs so that they can reassess your need for medications and monitor your lab values.      Medication List    STOP taking these medications       amLODipine 10 MG tablet  Commonly known as:  NORVASC      TAKE these medications       aspirin 325 MG tablet  Take 325 mg by mouth every evening.     atorvastatin 20 MG tablet  Commonly known as:  LIPITOR  Take 1 tablet (20 mg total) by mouth daily at 6 PM.     b complex-vitamin c-folic acid 0.8 MG Tabs tablet  Take 0.8 mg by mouth at bedtime.     calcium carbonate 500 MG chewable tablet  Commonly known as:  TUMS - dosed in mg elemental calcium  Chew 1 tablet by mouth 3 (three) times daily with meals.     carvedilol 3.125 MG tablet  Commonly known as:  COREG  Take 1 tablet (3.125 mg total) by mouth 2 (two) times daily with a meal.     HYDROcodone-acetaminophen 5-325 MG per tablet  Commonly known as:  NORCO/VICODIN  Take 1 tablet by mouth every 6 (six) hours as needed for moderate pain or severe pain.     hydrOXYzine 10 MG tablet  Commonly known as:  ATARAX/VISTARIL  Take 10 mg by mouth at bedtime as needed. For sleep     isosorbide-hydrALAZINE 20-37.5 MG per tablet  Commonly known as:  BIDIL  Take 1 tablet by mouth 2 (two) times daily.     OVER THE COUNTER MEDICATION  Take 60 mLs by mouth daily. "Fruit of the Spirit."     PARoxetine 20 MG tablet  Commonly known as:  PAXIL  Take 20 mg by mouth daily.          Allergies  Allergen Reactions  . Benazepril Hcl Hives      The results of significant diagnostics from this  hospitalization (including imaging, microbiology, ancillary and laboratory) are listed below for reference.    Significant Diagnostic Studies: Ct Head Wo Contrast  12/13/2013   CLINICAL DATA:  Headache.  EXAM: CT HEAD WITHOUT CONTRAST  TECHNIQUE: Contiguous axial images were obtained from the base of the skull through the vertex without intravenous contrast.  COMPARISON:  09/19/2011  FINDINGS: Old left  parietal infarct with encephalomalacia. Diffuse cerebral atrophy and severe chronic microvascular changes throughout the deep white matter. No acute infarction. No mass lesion. No hemorrhage or hydrocephalus.  Visualized paranasal sinuses and mastoids clear. Orbital soft tissues unremarkable. No acute calvarial abnormality.  IMPRESSION: Old left posterior parietal infarct with encephalomalacia.  Atrophy, chronic small vessel disease.  No acute intracranial abnormality.   Electronically Signed   By: Rolm Baptise M.D.   On: 12/13/2013 21:07   Nm Myocar Multi W/spect W/wall Motion / Ef  12/14/2013   CLINICAL DATA:  78 year old with current history of hypertension and end-stage renal disease on hemodialysis, presenting this admission with chest pain and mildly elevated cardiac enzymes.  EXAM: MYOCARDIAL IMAGING WITH SPECT (REST AND PHARMACOLOGIC-STRESS)  GATED LEFT VENTRICULAR WALL MOTION STUDY  LEFT VENTRICULAR EJECTION FRACTION  TECHNIQUE: Standard myocardial SPECT imaging was performed after resting intravenous injection of 10 mCi Tc-58m sestamibi. Subsequently, intravenous infusion of Lexiscan was performed under the supervision of the Cardiology staff. At peak effect of the drug, 30 mCi Tc-27m sestamibi was injected intravenously and standard myocardial SPECT imaging was performed. Quantitative gated imaging was also performed to evaluate left ventricular wall motion, and estimate left ventricular ejection fraction.  COMPARISON:  None.  FINDINGS: Immediate post regadenoson images demonstrated diminished activity  in the inferior wall, anteroapical wall and apicoseptal wall. Initial resting images demonstrate similar findings. No evidence of reversibility to suggest ischemia. Findings confirmed by the computer generated polar map.  Gated images demonstrate global hypokinesis with essential akinesis involving the inferior wall.  Estimated QGS left ventricular ejection fraction measured 25%, with an end-diastolic volume of 716 ml and an end systolic volume of 967 ml.  IMPRESSION: 1. Prior MI involving the inferior wall and apex. 2. No evidence of myocardial ischemia. 3. Left ventricular enlargement. 4. Global hypokinesis with essential akinesis in the inferior wall. 5. Estimated QGS ejection fraction 25%.   Electronically Signed   By: Evangeline Dakin M.D.   On: 12/14/2013 14:43   Dg Chest Port 1 View  12/14/2013   CLINICAL DATA:  Shortness of breath  EXAM: PORTABLE CHEST - 1 VIEW  COMPARISON:  12/13/2013  FINDINGS: Cardiac shadow is stable. The lungs are well aerated bilaterally. Mild interstitial changes are seen without focal confluent infiltrate. No bony abnormality is noted.  IMPRESSION: No change from the prior exam.   Electronically Signed   By: Inez Catalina M.D.   On: 12/14/2013 08:00   Dg Chest Port 1 View  12/13/2013   CLINICAL DATA:  Left-sided chest pressure  EXAM: PORTABLE CHEST - 1 VIEW  COMPARISON:  02/20/2012  FINDINGS: There is no focal parenchymal opacity, pleural effusion, or pneumothorax. The heart and mediastinal contours are unremarkable. There is thoracic aortic atherosclerosis.  There is mild cardiomegaly.  The osseous structures are unremarkable.  IMPRESSION: No active disease.   Electronically Signed   By: Kathreen Devoid   On: 12/13/2013 14:33    Microbiology: Recent Results (from the past 240 hour(s))  CULTURE, BLOOD (ROUTINE X 2)     Status: None   Collection Time    12/13/13  2:40 PM      Result Value Ref Range Status   Specimen Description BLOOD RIGHT ARM   Final   Special Requests  BOTTLES DRAWN AEROBIC AND ANAEROBIC 10CC   Final   Culture  Setup Time     Final   Value: 12/13/2013 19:49     Performed at [facility]  Final   Value:        BLOOD CULTURE RECEIVED NO GROWTH TO DATE CULTURE WILL BE HELD FOR 5 DAYS BEFORE ISSUING A FINAL NEGATIVE REPORT     Performed at [facility]   Report Status PENDING   Incomplete  CULTURE, BLOOD (ROUTINE X 2)     Status: None   Collection Time    12/13/13  2:45 PM      Result Value Ref Range Status   Specimen Description BLOOD RIGHT HAND   Final   Special Requests BOTTLES DRAWN AEROBIC ONLY 5CC   Final   Culture  Setup Time     Final   Value: 12/13/2013 19:49     Performed at [facility]   Culture     Final   Value:        BLOOD CULTURE RECEIVED NO GROWTH TO DATE CULTURE WILL BE HELD FOR 5 DAYS BEFORE ISSUING A FINAL NEGATIVE REPORT     Performed at [facility]   Report Status PENDING   Incomplete     Labs: Basic Metabolic Panel:  Recent Labs Lab 12/13/13 1407 12/15/13 2049 12/16/13 0445  NA 137  --  138  K 4.0  --  3.5*  CL 92*  --  96  CO2 26  --  28  GLUCOSE 129*  --  154*  BUN 33*  --  17  CREATININE 8.79*  --  5.32*  CALCIUM 9.1  --  8.9  PHOS  --  2.9  --    Liver Function Tests:  Recent Labs Lab 12/13/13 1407  AST 17  ALT 8  ALKPHOS 93  BILITOT 0.9  PROT 8.1  ALBUMIN 2.7*   No results found for this basename: LIPASE, AMYLASE,  in the last 168 hours No results found for this basename: AMMONIA,  in the last 168 hours CBC:  Recent Labs Lab 12/13/13 1407 12/14/13 0021  WBC 8.5 7.1  NEUTROABS 6.5  --   HGB 9.6* 10.1*  HCT 29.8* 31.1*  MCV 84.7 84.3  PLT 312 313   Cardiac Enzymes:  Recent Labs Lab 12/13/13 1952 12/14/13 0021 12/14/13 0530  TROPONINI 0.55* 0.50* 0.53*   BNP: BNP (last 3 results)  Recent Labs  12/13/13 1408  PROBNP >70000.0*   CBG:  Recent Labs Lab 12/15/13 1708 12/15/13 2001 12/16/13 0059 12/16/13 0742  12/16/13 1150  GLUCAP 138* 182* 118* 112* 122*       SignedDebbe Odea, MD [facility] 12/16/2013, 12:53 PM

## 2013-12-16 NOTE — Progress Notes (Signed)
Attempted to confirm that patient was awaiting children to come and discuss patient potentially discharging to short term rehab. Pt denies, says he "doesnt do rehab." states children are coming to pick him up and take him home. Will continue to await arrival to discuss SNF placement and or discharge home.

## 2013-12-17 NOTE — Care Management Note (Signed)
Late entry:  CARE MANAGEMENT NOTE 12/17/2013  Patient:  Sean [name], Sean [name]   Account Number:  [id number]  Date Initiated:  12/16/2013  Documentation initiated by:  Jasmine Pang  Subjective/Objective Assessment:   Referral for [facility] services     Action/Plan:   12/16/13 following for d/c needs, pt active with [facility] for wound care, PT etc. PT recommending personal care services however pt does not have Medicaid, so he would have to privately hire this care.   Anticipated DC Date:  12/16/2013   Anticipated DC Plan:  [place]         Choice offered to / List presented to:          [facility] arranged  HH-1 RN  [place]  HH-6 SOCIAL WORKER      Status of service:   Medicare Important Message given?  YES (If response is "NO", the following Medicare IM given date fields will be blank) Date Medicare IM given:  12/16/2013 Medicare IM given by:  Chantilly Linskey Date Additional Medicare IM given:   Additional Medicare IM given by:    Discharge Disposition:  [place]  Per UR Regulation:    If discussed at Long Length of Stay Meetings, dates discussed:    Comments:  12/16/13 Following for d/c needs, SNF vs HH, PT recommending "PCA" or 24 hr care. Pt would have to make arrangements for 24 hr care privately .  Will continue to follow, spoke with [facility] who will continue [facility], HHPT and social worker as needed. Jasmine Pang RN MPH, case manager, [phone] addem: Met with pt who states that he needs more help at home, attempted to move pt forward on a decision re D/c needs and asked to have [place] fax out info to SNF for possible short term stay for rehab. Pt wants to talk with children now that he knows that he is being d/c. Unable to get pt to allow search for rehab options to begin until he has talked with his children either late today or tonight. This CM explained to pt that the CSW could find options for his and his children to consider, however he is unwilling  to move forward with the process, even when told repeatly that a search would simply provide d/c options and would not mean that he had to go to any specific rehab facility. Pt is not concerned that he may be d/c, and feels that he is being "pushed" . This CM attempted to help this pt see the need to explore options that he can discuss with this children, however, pt does not want to move forward. Pt RN, Susie, most helpful attempting to help pt understand that he needs to more forward with a d/c plan.   CRoyal RN MPH, case manager, [phone]

## 2013-12-19 LAB — CULTURE, BLOOD (ROUTINE X 2)
CULTURE: NO GROWTH
Culture: NO GROWTH

## 2013-12-20 ENCOUNTER — Emergency Department (HOSPITAL_COMMUNITY): Payer: Medicare Other

## 2013-12-20 ENCOUNTER — Inpatient Hospital Stay (HOSPITAL_COMMUNITY)
Admission: EM | Admit: 2013-12-20 | Discharge: 2013-12-24 | DRG: 640 | Disposition: A | Discharge status: Skilled Nursing Facility | Payer: Medicare Other | Attending: Internal Medicine | Admitting: Internal Medicine

## 2013-12-20 ENCOUNTER — Encounter (HOSPITAL_COMMUNITY): Payer: Self-pay | Admitting: Emergency Medicine

## 2013-12-20 DIAGNOSIS — M899 Disorder of bone, unspecified: Secondary | ICD-10-CM | POA: Diagnosis present

## 2013-12-20 DIAGNOSIS — F329 Major depressive disorder, single episode, unspecified: Secondary | ICD-10-CM | POA: Diagnosis present

## 2013-12-20 DIAGNOSIS — I10 Essential (primary) hypertension: Secondary | ICD-10-CM | POA: Diagnosis present

## 2013-12-20 DIAGNOSIS — M79605 Pain in left leg: Secondary | ICD-10-CM | POA: Diagnosis present

## 2013-12-20 DIAGNOSIS — Z992 Dependence on renal dialysis: Secondary | ICD-10-CM

## 2013-12-20 DIAGNOSIS — F3289 Other specified depressive episodes: Secondary | ICD-10-CM | POA: Diagnosis present

## 2013-12-20 DIAGNOSIS — Z8673 Personal history of transient ischemic attack (TIA), and cerebral infarction without residual deficits: Secondary | ICD-10-CM

## 2013-12-20 DIAGNOSIS — L97909 Non-pressure chronic ulcer of unspecified part of unspecified lower leg with unspecified severity: Secondary | ICD-10-CM | POA: Diagnosis present

## 2013-12-20 DIAGNOSIS — E785 Hyperlipidemia, unspecified: Secondary | ICD-10-CM | POA: Diagnosis present

## 2013-12-20 DIAGNOSIS — D649 Anemia, unspecified: Secondary | ICD-10-CM | POA: Diagnosis present

## 2013-12-20 DIAGNOSIS — R52 Pain, unspecified: Secondary | ICD-10-CM | POA: Diagnosis present

## 2013-12-20 DIAGNOSIS — I120 Hypertensive chronic kidney disease with stage 5 chronic kidney disease or end stage renal disease: Secondary | ICD-10-CM | POA: Diagnosis present

## 2013-12-20 DIAGNOSIS — E119 Type 2 diabetes mellitus without complications: Secondary | ICD-10-CM | POA: Diagnosis present

## 2013-12-20 DIAGNOSIS — M949 Disorder of cartilage, unspecified: Secondary | ICD-10-CM

## 2013-12-20 DIAGNOSIS — Z7982 Long term (current) use of aspirin: Secondary | ICD-10-CM

## 2013-12-20 DIAGNOSIS — L97929 Non-pressure chronic ulcer of unspecified part of left lower leg with unspecified severity: Secondary | ICD-10-CM

## 2013-12-20 DIAGNOSIS — N2581 Secondary hyperparathyroidism of renal origin: Secondary | ICD-10-CM

## 2013-12-20 DIAGNOSIS — L97921 Non-pressure chronic ulcer of unspecified part of left lower leg limited to breakdown of skin: Secondary | ICD-10-CM

## 2013-12-20 DIAGNOSIS — Z79899 Other long term (current) drug therapy: Secondary | ICD-10-CM

## 2013-12-20 DIAGNOSIS — I2510 Atherosclerotic heart disease of native coronary artery without angina pectoris: Secondary | ICD-10-CM | POA: Diagnosis present

## 2013-12-20 DIAGNOSIS — I252 Old myocardial infarction: Secondary | ICD-10-CM

## 2013-12-20 DIAGNOSIS — N186 End stage renal disease: Secondary | ICD-10-CM | POA: Diagnosis present

## 2013-12-20 DIAGNOSIS — Z87891 Personal history of nicotine dependence: Secondary | ICD-10-CM

## 2013-12-20 LAB — COMPREHENSIVE METABOLIC PANEL
ALK PHOS: 94 U/L (ref 39–117)
ALT: 13 U/L (ref 0–53)
AST: 20 U/L (ref 0–37)
Albumin: 2.7 g/dL — ABNORMAL LOW (ref 3.5–5.2)
Anion gap: 20 — ABNORMAL HIGH (ref 5–15)
BUN: 34 mg/dL — ABNORMAL HIGH (ref 6–23)
CO2: 25 mEq/L (ref 19–32)
Calcium: 8.9 mg/dL (ref 8.4–10.5)
Chloride: 91 mEq/L — ABNORMAL LOW (ref 96–112)
Creatinine, Ser: 8.78 mg/dL — ABNORMAL HIGH (ref 0.50–1.35)
GFR calc Af Amer: 6 mL/min — ABNORMAL LOW (ref >90)
GFR calc non Af Amer: 5 mL/min — ABNORMAL LOW (ref >90)
Glucose, Bld: 153 mg/dL — ABNORMAL HIGH (ref 70–99)
POTASSIUM: 3.7 meq/L (ref 3.7–5.3)
SODIUM: 136 meq/L — AB (ref 137–147)
TOTAL PROTEIN: 8 g/dL (ref 6.0–8.3)
Total Bilirubin: 0.7 mg/dL (ref 0.3–1.2)

## 2013-12-20 LAB — CBC WITH DIFFERENTIAL/PLATELET
BASOS PCT: 0 % (ref 0–1)
Basophils Absolute: 0 10*3/uL (ref 0.0–0.1)
EOS ABS: 0.1 10*3/uL (ref 0.0–0.7)
Eosinophils Relative: 1 % (ref 0–5)
HCT: 32 % — ABNORMAL LOW (ref 39.0–52.0)
Hemoglobin: 10.3 g/dL — ABNORMAL LOW (ref 13.0–17.0)
Lymphocytes Relative: 11 % — ABNORMAL LOW (ref 12–46)
Lymphs Abs: 0.9 10*3/uL (ref 0.7–4.0)
MCH: 27.1 pg (ref 26.0–34.0)
MCHC: 32.2 g/dL (ref 30.0–36.0)
MCV: 84.2 fL (ref 78.0–100.0)
Monocytes Absolute: 0.9 10*3/uL (ref 0.1–1.0)
Monocytes Relative: 11 % (ref 3–12)
NEUTROS PCT: 77 % (ref 43–77)
Neutro Abs: 5.9 10*3/uL (ref 1.7–7.7)
PLATELETS: 316 10*3/uL (ref 150–400)
RBC: 3.8 MIL/uL — ABNORMAL LOW (ref 4.22–5.81)
RDW: 14.7 % (ref 11.5–15.5)
WBC: 7.8 10*3/uL (ref 4.0–10.5)

## 2013-12-20 LAB — GLUCOSE, CAPILLARY
GLUCOSE-CAPILLARY: 111 mg/dL — AB (ref 70–99)
Glucose-Capillary: 152 mg/dL — ABNORMAL HIGH (ref 70–99)

## 2013-12-20 MED ORDER — ASPIRIN 325 MG PO TABS
325.0000 mg | ORAL_TABLET | Freq: Every evening | ORAL | Status: DC
  Administered 2013-12-20 – 2013-12-24 (×5): 325 mg via ORAL
  Filled 2013-12-20 (×5): qty 1

## 2013-12-20 MED ORDER — CALCIUM CARBONATE ANTACID 500 MG PO CHEW
1.0000 | CHEWABLE_TABLET | Freq: Three times a day (TID) | ORAL | Status: DC
  Administered 2013-12-21: 200 mg via ORAL
  Filled 2013-12-20 (×5): qty 1

## 2013-12-20 MED ORDER — HYDROXYZINE HCL 10 MG PO TABS
10.0000 mg | ORAL_TABLET | Freq: Every evening | ORAL | Status: DC | PRN
  Filled 2013-12-20: qty 1

## 2013-12-20 MED ORDER — ACETAMINOPHEN 325 MG PO TABS: 650.0000 mg | ORAL_TABLET | Freq: Four times a day (QID) | ORAL | Status: DC | PRN

## 2013-12-20 MED ORDER — MORPHINE SULFATE 2 MG/ML IJ SOLN
2.0000 mg | INTRAMUSCULAR | Status: DC | PRN
  Administered 2013-12-20 – 2013-12-24 (×10): 2 mg via INTRAVENOUS
  Filled 2013-12-20 (×9): qty 1

## 2013-12-20 MED ORDER — CARVEDILOL 3.125 MG PO TABS
3.1250 mg | ORAL_TABLET | Freq: Two times a day (BID) | ORAL | Status: DC
  Administered 2013-12-20 – 2013-12-24 (×9): 3.125 mg via ORAL
  Filled 2013-12-20 (×12): qty 1

## 2013-12-20 MED ORDER — ATORVASTATIN CALCIUM 20 MG PO TABS
20.0000 mg | ORAL_TABLET | Freq: Every day | ORAL | Status: DC
  Administered 2013-12-20 – 2013-12-23 (×4): 20 mg via ORAL
  Filled 2013-12-20 (×5): qty 1

## 2013-12-20 MED ORDER — DOCUSATE SODIUM 100 MG PO CAPS
100.0000 mg | ORAL_CAPSULE | Freq: Two times a day (BID) | ORAL | Status: DC
  Administered 2013-12-20 – 2013-12-24 (×8): 100 mg via ORAL
  Filled 2013-12-20 (×12): qty 1

## 2013-12-20 MED ORDER — RENA-VITE PO TABS
1.0000 | ORAL_TABLET | Freq: Every day | ORAL | Status: DC
  Administered 2013-12-20: 23:00:00 via ORAL
  Administered 2013-12-21 – 2013-12-23 (×3): 1 via ORAL
  Filled 2013-12-20 (×6): qty 1

## 2013-12-20 MED ORDER — HEPARIN SODIUM (PORCINE) 5000 UNIT/ML IJ SOLN
5000.0000 [IU] | Freq: Three times a day (TID) | INTRAMUSCULAR | Status: DC
  Administered 2013-12-20 – 2013-12-24 (×10): 5000 [IU] via SUBCUTANEOUS
  Filled 2013-12-20 (×14): qty 1

## 2013-12-20 MED ORDER — ONDANSETRON HCL 4 MG PO TABS
4.0000 mg | ORAL_TABLET | Freq: Four times a day (QID) | ORAL | Status: DC | PRN
  Administered 2013-12-22: 4 mg via ORAL
  Filled 2013-12-20: qty 1

## 2013-12-20 MED ORDER — ONDANSETRON HCL 4 MG/2ML IJ SOLN: 4.0000 mg | Freq: Four times a day (QID) | INTRAMUSCULAR | Status: DC | PRN

## 2013-12-20 MED ORDER — PAROXETINE HCL 20 MG PO TABS
20.0000 mg | ORAL_TABLET | Freq: Every day | ORAL | Status: DC
  Administered 2013-12-20 – 2013-12-24 (×5): 20 mg via ORAL
  Filled 2013-12-20 (×5): qty 1

## 2013-12-20 MED ORDER — ACETAMINOPHEN 650 MG RE SUPP: 650.0000 mg | Freq: Four times a day (QID) | RECTAL | Status: DC | PRN

## 2013-12-20 MED ORDER — ISOSORB DINITRATE-HYDRALAZINE 20-37.5 MG PO TABS
1.0000 | ORAL_TABLET | Freq: Two times a day (BID) | ORAL | Status: DC
  Administered 2013-12-20 – 2013-12-24 (×8): 1 via ORAL
  Filled 2013-12-20 (×9): qty 1

## 2013-12-20 NOTE — H&P (Signed)
History and Physical  Sean [name] ALP:[id number] DOB: 16-Jul-1935 DOA: 12/20/2013   PCP: Philis Fendt, MD   Chief Complaint: Uncontrolled left leg pain  HPI:  78 year old male with a history of ESRD, coronary artery disease with MI in 2011, hypertension, hyperlipidemia presents with worsening left lower extremity pain since he was discharged from the hospital on 12/16/2013. The patient had a nuclear medicine stress test on 12/14/2013 which did not show any reversible ischemia but showed a EF of 25%. Echocardiogram on 12/15/2013 showed EF 45-50% which was decreased from his previous echocardiogram in April 2013 which showed an EF 65-70%. The patient was started on carvedilol and BiDil. The patient refused cardiac catheterization. He represents today because of uncontrolled pain in his left lower extremity. He states that he is having trouble standing, and therefore he missed his dialysis on 12/19/2013. He denies any fevers, chills, chest pain, shortness breath, vomiting, diarrhea. He has intermittent ulcer. He denies any dizziness or syncope. He denies any hematochezia or melena. The patient is a poor historian. He is unable to tell me what opioid he is taking and how many per day. Review of his records shows that he has been taking hydrocodone. Because of his uncontrolled pain in his dialysis, admission was advised. The patient previously went to the wound care center at [facility], but because of his recent admissions in the hospital, he has not been there for a few weeks.  In emergency department, the patient's electrolytes were unremarkable. Serum creatinine 8.7. CBC was 7.2 hepatic metastases are negative. EKG showed left bundle branch block which was unchanged from previous EKGs. Assessment/Plan: Uncontrolled pain -Secondary to calciphylaxis--left lower extremity -IV opioids Calciphylaxis -12/15/2013--iPTH=802.1 -12/15/13--phos= 2.9 -Pt is not presently on any phosphate binders -Control  pain -Consult wound care nurse -Although there is a foul odor--there is no erythema, warmth, lymphangitis -in addition, the patient has no fever or leukocytosis -may consider sodium thiosulfate on HD--defer to Renal -venous duplex r/o DVT ESRD -I consulted renal--spoke with Dr. Graylon Gunning - he is not volume overloaded, potassium 3.7  -missed HD 7/10 -Treatment of secondary hyperparathyroidism per nephrology Ischemic cardiomyopathy/chronic systolic CHF  -EF 35-32%  -Continue BiDil and carvedilol  -Presently no chest pain  -continue ASA Hyperlipidemia  -Continue Lipitor  Depression  -Continue Paxil  Diabetes mellitus type 2 -Diet controlled -Hemoglobin A1c 6.3 on 12/14/13      Past Medical History  Diagnosis Date  . Hyperparathyroidism   . Arthritis   . Hyperlipidemia   . Hypertension   . Neuromuscular disorder   . Stroke   . GERD (gastroesophageal reflux disease)   . Headache(784.0)     occasional  migraine   . Heart attack 2011; 1990's  . Type II diabetes mellitus   . Peripheral neuropathy   . Anxiety   . Depression   . Dialysis patient     M, W, F; [place] (09/19/11)  . Chronic kidney disease     Hemo MWF   Past Surgical History  Procedure Laterality Date  . Colon surgery  2011    colon resection   . Parathyroidectomy  2011  . Capd insertion  09/12/2011    Procedure: LAPAROSCOPIC INSERTION CONTINUOUS AMBULATORY PERITONEAL DIALYSIS  (CAPD) CATHETER;  Surgeon: Edward Jolly, MD;  Location: [place];  Service: General;  Laterality: N/A;  . Sp av dialysis shunt access existing *l*  2003  . Dg av dialysis shunt intro ndl*r* or      "  not working" (09/19/11)  . Capd removal  03/12/2012    Procedure: CONTINUOUS AMBULATORY PERITONEAL DIALYSIS  (CAPD) CATHETER REMOVAL;  Surgeon: Edward Jolly, MD;  Location: [place];  Service: General;  Laterality: N/A;   Social History:  reports that he quit smoking about 37 years ago. His smoking use included Cigarettes.  He has a 1.75 pack-year smoking history. He has never used smokeless tobacco. He reports that he drinks alcohol. He reports that he does not use illicit drugs.   Family History  Problem Relation Age of Onset  . Cancer Sister     breast  . Cancer Brother     lung  . Anesthesia problems Neg Hx   . Hypotension Neg Hx   . Malignant hyperthermia Neg Hx   . Pseudochol deficiency Neg Hx      Allergies  Allergen Reactions  . Benazepril Hcl Hives      Prior to Admission medications   Medication Sig Start Date End Date Taking? Authorizing Provider  aspirin 325 MG tablet Take 325 mg by mouth every evening.   Yes Historical Provider, MD  atorvastatin (LIPITOR) 20 MG tablet Take 20 mg by mouth at bedtime.   Yes Historical Provider, MD  b complex-vitamin c-folic acid (NEPHRO-VITE) 0.8 MG TABS Take 0.8 mg by mouth at bedtime.   Yes Historical Provider, MD  calcium carbonate (TUMS - DOSED IN MG ELEMENTAL CALCIUM) 500 MG chewable tablet Chew 1 tablet by mouth 3 (three) times daily with meals.   Yes Historical Provider, MD  carvedilol (COREG) 3.125 MG tablet Take 1 tablet (3.125 mg total) by mouth 2 (two) times daily with a meal. 12/16/13  Yes Debbe Odea, MD  HYDROcodone-acetaminophen (NORCO/VICODIN) 5-325 MG per tablet Take 1 tablet by mouth every 6 (six) hours as needed for moderate pain or severe pain. 12/16/13  Yes Debbe Odea, MD  hydrOXYzine (ATARAX/VISTARIL) 10 MG tablet Take 10 mg by mouth at bedtime as needed. For sleep   Yes Historical Provider, MD  isosorbide-hydrALAZINE (BIDIL) 20-37.5 MG per tablet Take 1 tablet by mouth 2 (two) times daily. 12/16/13  Yes Debbe Odea, MD  OVER THE COUNTER MEDICATION Take 60 mLs by mouth daily. "Fruit of the Spirit."   Yes Historical Provider, MD  PARoxetine (PAXIL) 20 MG tablet Take 20 mg by mouth daily.   Yes Historical Provider, MD    Review of Systems:  Constitutional:  No weight loss, night sweats, Fevers, chills Head&Eyes: No headache.  No  vision loss.  No eye pain or scotoma ENT:  No Difficulty swallowing,Tooth/dental problems,Sore throat,  No ear ache, post nasal drip,  Cardio-vascular:  No chest pain, Orthopnea, PND, swelling in lower extremities,  dizziness, palpitations  GI:  No  abdominal pain,  vomiting, diarrhea, loss of appetite, hematochezia, melena, heartburn, indigestion, Resp:  No shortness of breath with exertion or at rest. No cough. No coughing up of blood .No wheezing.No chest wall deformity  Skin:  no rash or lesions.  GU:  no dysuria, change in color of urine, no urgency or frequency. No flank pain.  Musculoskeletal:  No joint pain or swelling. No decreased range of motion. No back pain.  Psych:  No change in mood or affect.  Neurologic:  no dysesthesia, no focal weakness, no vision loss. No syncope  Physical Exam: Filed Vitals:   12/20/13 1206 12/20/13 1216 12/20/13 1400 12/20/13 1500  BP: 116/60 129/93 143/74 144/87  Pulse: 95 96 93 98  Temp:      TempSrc:  Resp: 21 21 18 21   SpO2: 97% 96% 98% 95%   General:  A&O x 3, NAD, nontoxic, pleasant/cooperative Head/Eye: No conjunctival hemorrhage, no icterus, [place]/AT, No nystagmus ENT:  No icterus,  No thrush,  no pharyngeal exudate Neck:  No masses, no lymphadenpathy, no bruits CV:  RRR, no rub, no gallop, no S3 Lung:  CTAB, good air movement, no wheeze, no rhonchi Abdomen: soft/NT, +BS, nondistended, no peritoneal signs Ext: Left lower extremity from the knee to the ankle with large eschar tightly on the pretibial area with malodorous wound, but does not have any lymphangitis, erythema, warmth there is a scant amount of drainage.   Labs on Admission:  Basic Metabolic Panel:  Recent Labs Lab 12/15/13 2049 12/16/13 0445 12/20/13 1237  NA  --  138 136*  K  --  3.5* 3.7  CL  --  96 91*  CO2  --  28 25  GLUCOSE  --  154* 153*  BUN  --  17 34*  CREATININE  --  5.32* 8.78*  CALCIUM  --  8.9 8.9  PHOS 2.9  --   --    Liver Function  Tests:  Recent Labs Lab 12/20/13 1237  AST 20  ALT 13  ALKPHOS 94  BILITOT 0.7  PROT 8.0  ALBUMIN 2.7*   No results found for this basename: LIPASE, AMYLASE,  in the last 168 hours No results found for this basename: AMMONIA,  in the last 168 hours CBC:  Recent Labs Lab 12/14/13 0021 12/20/13 1237  WBC 7.1 7.8  NEUTROABS  --  5.9  HGB 10.1* 10.3*  HCT 31.1* 32.0*  MCV 84.3 84.2  PLT 313 316   Cardiac Enzymes:  Recent Labs Lab 12/13/13 1952 12/14/13 0021 12/14/13 0530  TROPONINI 0.55* 0.50* 0.53*   BNP: No components found with this basename: POCBNP,  CBG:  Recent Labs Lab 12/15/13 1708 12/15/13 2001 12/16/13 0059 12/16/13 0742 12/16/13 1150  GLUCAP 138* 182* 118* 112* 122*    Radiological Exams on Admission: Dg Chest 2 View  12/20/2013   CLINICAL DATA:  LEFT lower leg wound infection for 2 weeks, history dialysis, hypertension, diabetes, smoking, stroke  EXAM: CHEST  2 VIEW  COMPARISON:  12/14/2013  FINDINGS: Enlargement of cardiac silhouette.  Tortuous aorta.  Mediastinal contours and pulmonary vascularity normal.  Eventration central LEFT diaphragm with LEFT basilar atelectasis.  No acute infiltrate, pleural effusion or pneumothorax.  No acute osseous findings.  IMPRESSION: LEFT basilar atelectasis.   Electronically Signed   By: Lavonia Dana M.D.   On: 12/20/2013 14:12    EKG: Independently reviewed. Sinus rhythm, left bundle branch block    Time spent:60 minutes Code Status:   FULL Family Communication:   No Family at bedside   Sean Letendre, DO  [facility] Pager [phone]  If 7PM-7AM, please contact night-coverage [email or web address] Password [facility] 12/20/2013, 3:59 PM

## 2013-12-20 NOTE — ED Notes (Signed)
MD at bedside. Admitting  

## 2013-12-20 NOTE — ED Provider Notes (Signed)
CSN: [id number]     Arrival date & time 12/20/13  1141 History   First MD Initiated Contact with Patient 12/20/13 1207     Chief Complaint  Patient presents with  . Wound Infection    left lower leg  . Chest Injury     (Consider location/radiation/quality/duration/timing/severity/associated sxs/prior Treatment) HPI Comments: Patient is a 78 year old male with history of end-stage renal disease on hemodialysis. He normally goes Monday Wednesday and Friday but has missed his last 2 sessions due to weakness and increasing pain in his left leg. He was recently admitted for a wound infection of the left leg. He is having home health visits for wound changes. He denies any fevers or chills but states the pain in his leg is significantly worsening. He denies any new injury or trauma.  The history is provided by the patient.    Past Medical History  Diagnosis Date  . Hyperparathyroidism   . Arthritis   . Hyperlipidemia   . Hypertension   . Neuromuscular disorder   . Stroke   . GERD (gastroesophageal reflux disease)   . Headache(784.0)     occasional  migraine   . Heart attack 2011; 1990's  . Type II diabetes mellitus   . Peripheral neuropathy   . Anxiety   . Depression   . Dialysis patient     M, W, F; [place] (09/19/11)  . Chronic kidney disease     Hemo MWF   Past Surgical History  Procedure Laterality Date  . Colon surgery  2011    colon resection   . Parathyroidectomy  2011  . Capd insertion  09/12/2011    Procedure: LAPAROSCOPIC INSERTION CONTINUOUS AMBULATORY PERITONEAL DIALYSIS  (CAPD) CATHETER;  Surgeon: Edward Jolly, MD;  Location: [place];  Service: General;  Laterality: N/A;  . Sp av dialysis shunt access existing *l*  2003  . Dg av dialysis shunt intro ndl*r* or      "not working" (09/19/11)  . Capd removal  03/12/2012    Procedure: CONTINUOUS AMBULATORY PERITONEAL DIALYSIS  (CAPD) CATHETER REMOVAL;  Surgeon: Edward Jolly, MD;  Location: [facility];   Service: General;  Laterality: N/A;   Family History  Problem Relation Age of Onset  . Cancer Sister     breast  . Cancer Brother     lung  . Anesthesia problems Neg Hx   . Hypotension Neg Hx   . Malignant hyperthermia Neg Hx   . Pseudochol deficiency Neg Hx    History  Substance Use Topics  . Smoking status: Former Smoker -- 0.25 packs/day for 7 years    Types: Cigarettes    Quit date: 06/12/1976  . Smokeless tobacco: Never Used  . Alcohol Use: Yes     Comment: 09/19/11 "occasionally; last alcohol 2011"    Review of Systems  All other systems reviewed and are negative.     Allergies  Benazepril hcl  Home Medications   Prior to Admission medications   Medication Sig Start Date End Date Taking? Authorizing Provider  aspirin 325 MG tablet Take 325 mg by mouth every evening.   Yes Historical Provider, MD  atorvastatin (LIPITOR) 20 MG tablet Take 20 mg by mouth at bedtime.   Yes Historical Provider, MD  b complex-vitamin c-folic acid (NEPHRO-VITE) 0.8 MG TABS Take 0.8 mg by mouth at bedtime.   Yes Historical Provider, MD  calcium carbonate (TUMS - DOSED IN MG ELEMENTAL CALCIUM) 500 MG chewable tablet Chew 1 tablet by  mouth 3 (three) times daily with meals.   Yes Historical Provider, MD  carvedilol (COREG) 3.125 MG tablet Take 1 tablet (3.125 mg total) by mouth 2 (two) times daily with a meal. 12/16/13  Yes Debbe Odea, MD  HYDROcodone-acetaminophen (NORCO/VICODIN) 5-325 MG per tablet Take 1 tablet by mouth every 6 (six) hours as needed for moderate pain or severe pain. 12/16/13  Yes Debbe Odea, MD  hydrOXYzine (ATARAX/VISTARIL) 10 MG tablet Take 10 mg by mouth at bedtime as needed. For sleep   Yes Historical Provider, MD  isosorbide-hydrALAZINE (BIDIL) 20-37.5 MG per tablet Take 1 tablet by mouth 2 (two) times daily. 12/16/13  Yes Debbe Odea, MD  OVER THE COUNTER MEDICATION Take 60 mLs by mouth daily. "Fruit of the Spirit."   Yes Historical Provider, MD  PARoxetine (PAXIL) 20 MG  tablet Take 20 mg by mouth daily.   Yes Historical Provider, MD   BP 116/60  Pulse 95  Temp(Src) 98.2 F (36.8 C) (Oral)  Resp 21  SpO2 97% Physical Exam  Nursing note and vitals reviewed. Constitutional: He is oriented to person, place, and time. He appears well-developed and well-nourished. No distress.  HENT:  Head: Normocephalic and atraumatic.  Neck: Normal range of motion. Neck supple.  Musculoskeletal: Normal range of motion.  The left lower extremity is noted to have multiple necrotic lesions to the anterior tibia and fibula region. These are foul-smelling with greenish discharge. I am unable to palpate a dorsalis pedis pulse.  Neurological: He is alert and oriented to person, place, and time.  Skin: Skin is warm and dry. He is not diaphoretic.    ED Course  Procedures (including critical care time) Labs Review Labs Reviewed  CBC WITH DIFFERENTIAL  COMPREHENSIVE METABOLIC PANEL    Imaging Review No results found.   EKG Interpretation   Date/Time:  Saturday December 20 2013 12:44:14 EDT Ventricular Rate:  95 PR Interval:  178 QRS Duration: 148 QT Interval:  423 QTC Calculation: 532 R Axis:   -17 Text Interpretation:  Sinus rhythm Left bundle branch block Confirmed by  DELOS  MD, Brennden Masten ([id number]) on 12/20/2013 1:00:45 PM      MDM   Final diagnoses:  None    Patient is a 77 year old male with history of end-stage renal disease on hemodialysis. He has also been diagnosed with calciphylaxis and has lesions to his left leg which appear necrotic and infected. He is unable to make his dialysis appointments due to the discomfort in his leg.  Workup reveals no Indication for emergent dialysis, however due to the appearance of his left lower extremity, I feel as though admission is indicated for wound care and further evaluation. Have spoken with Dr. Carles Collet from the hospitalist service who agrees to admit the patient.    Veryl Speak, MD 12/20/13 [phone]

## 2013-12-20 NOTE — ED Notes (Signed)
[phone]. Sean [name]. Mr Matheney's niece. Call with updates

## 2013-12-20 NOTE — ED Notes (Signed)
Brought pt to room via wheelchair with family in tow; pt undressed, in gown, on monitor, continuous pulse oximetry and blood pressure cuff; family at bedside; Amy, NT aware

## 2013-12-20 NOTE — ED Notes (Signed)
Pt is here to have wound on left lower leg assessed question infection, patient is diabetic.  Pt needs HD as well and last Dialysis was done here on Tuesday.

## 2013-12-20 NOTE — ED Notes (Signed)
Unsuccessful attempt for peripheral site. IV team paged to meet pt on the floor.

## 2013-12-20 NOTE — Progress Notes (Signed)
Assessed right leg wounds.  Diabetic leg ulcerations present which are unstageable.  Scant amt. Of drainage noted (tan - green).  Larger ulceration consists of 95% eschar with a 5% area of separation of eschar to reveal slough and a non - granulating base.  Located lower aspect of left leg (shin) the  primary ulcer is 10 cm. x 8.25 cm x .5 cm.  A second ulceration is noted distal to the primary just below the medial left calf.  Area is 2.75 cm x 1.75 cm and is unstageable and dry. Bilateral heels are spongy with no evidence of breakdown or redness noted.  Heels elevated and propped on pillows to relieve pressure.  Pt. Unable to bear weight on left leg and is deconditioned.  Will need nutrition consult.

## 2013-12-20 NOTE — ED Notes (Signed)
Pt here for left lower leg wound.  Pt has home health that does dressing changes Monday-Wednesday-Friday.  Last dialysis was Wednesday, missed Friday d/t too weak to ride on [employer] bus.  Pt was in hospital last week for same.

## 2013-12-21 ENCOUNTER — Encounter (HOSPITAL_COMMUNITY): Payer: Self-pay | Admitting: *Deleted

## 2013-12-21 DIAGNOSIS — M79609 Pain in unspecified limb: Secondary | ICD-10-CM

## 2013-12-21 LAB — GLUCOSE, CAPILLARY
Glucose-Capillary: 119 mg/dL — ABNORMAL HIGH (ref 70–99)
Glucose-Capillary: 125 mg/dL — ABNORMAL HIGH (ref 70–99)
Glucose-Capillary: 135 mg/dL — ABNORMAL HIGH (ref 70–99)
Glucose-Capillary: 127 mg/dL — ABNORMAL HIGH (ref 70–99)

## 2013-12-21 LAB — RENAL FUNCTION PANEL
ALBUMIN: 2.5 g/dL — AB (ref 3.5–5.2)
Anion gap: 22 — ABNORMAL HIGH (ref 5–15)
BUN: 41 mg/dL — ABNORMAL HIGH (ref 6–23)
CHLORIDE: 90 meq/L — AB (ref 96–112)
CO2: 24 mEq/L (ref 19–32)
CREATININE: 9.76 mg/dL — AB (ref 0.50–1.35)
Calcium: 8.6 mg/dL (ref 8.4–10.5)
GFR calc Af Amer: 5 mL/min — ABNORMAL LOW (ref >90)
GFR, EST NON AFRICAN AMERICAN: 5 mL/min — AB (ref >90)
Glucose, Bld: 113 mg/dL — ABNORMAL HIGH (ref 70–99)
POTASSIUM: 3.8 meq/L (ref 3.7–5.3)
Phosphorus: 4.2 mg/dL (ref 2.3–4.6)
Sodium: 136 mEq/L — ABNORMAL LOW (ref 137–147)

## 2013-12-21 LAB — CBC
HCT: 28.3 % — ABNORMAL LOW (ref 39.0–52.0)
Hemoglobin: 8.9 g/dL — ABNORMAL LOW (ref 13.0–17.0)
MCH: 26.6 pg (ref 26.0–34.0)
MCHC: 31.4 g/dL (ref 30.0–36.0)
MCV: 84.7 fL (ref 78.0–100.0)
Platelets: 304 10*3/uL (ref 150–400)
RBC: 3.34 MIL/uL — ABNORMAL LOW (ref 4.22–5.81)
RDW: 14.9 % (ref 11.5–15.5)
WBC: 7.9 10*3/uL (ref 4.0–10.5)

## 2013-12-21 LAB — TSH: TSH: 1.05 u[IU]/mL (ref 0.350–4.500)

## 2013-12-21 LAB — VITAMIN B12: Vitamin B-12: 1157 pg/mL — ABNORMAL HIGH (ref 211–911)

## 2013-12-21 MED ORDER — COLLAGENASE 250 UNIT/GM EX OINT
1.0000 "application " | TOPICAL_OINTMENT | Freq: Every day | CUTANEOUS | Status: DC
  Administered 2013-12-21 – 2013-12-24 (×4): 1 via TOPICAL
  Filled 2013-12-21: qty 30

## 2013-12-21 MED ORDER — LANTHANUM CARBONATE 500 MG PO CHEW
1000.0000 mg | CHEWABLE_TABLET | Freq: Three times a day (TID) | ORAL | Status: DC
  Administered 2013-12-21 – 2013-12-24 (×8): 1000 mg via ORAL
  Filled 2013-12-21 (×12): qty 2

## 2013-12-21 MED ORDER — DARBEPOETIN ALFA-POLYSORBATE 100 MCG/0.5ML IJ SOLN
100.0000 ug | INTRAMUSCULAR | Status: DC
  Administered 2013-12-22: 100 ug via INTRAVENOUS
  Filled 2013-12-21: qty 0.5

## 2013-12-21 NOTE — Consult Note (Signed)
I have personally seen and examined this patient and agree with the assessment/plan as outlined above by Lyles PA. ESRD patient here for pain control (calciphylaxis ulcers). Continue MWF HD. ?Thiosulfate Pleas Carneal K.,MD 12/21/2013 1:00 PM

## 2013-12-21 NOTE — Consult Note (Signed)
[facility] Renal Consultation Note  Indication for Consultation:  Management of ESRD/hemodialysis; anemia, hypertension/volume and secondary hyperparathyroidism  HPI: Sean [name] is a 78 y.o. male with a history of diabetes, hypertension, CAD s/p MI in 01/2009, CVA in 09/2011, and ESRD on dialysis at the [facility] who presented to the ED yesterday with uncontrolled pain secondary to wounds at his left lower leg.  He was hospitalized 7/4-7 for evaluation of chest pain with new LBBB per EKG.  His stress test was negative on 7/5, but with EF 25%, and echocardiogram on 7/6 indicated EF down to 45-50% from previous study in 09/2011, but he refused further evaluation with cardiac catheterization.  During his hospitalization his lower left leg wounds, secondary to calciphylaxis, were evaluated, and he was discharged on PO Vicodin for pain, as well as continued outpatient wound care.  However, his pain worsened so that he could not walk or stand and prevented him from attending his scheduled dialysis on Friday 7/10.  He denies any fever, chills, nausea, or vomiting, but reports very little appetite.  He states that his pain is currently 4 of 10 on IV Morphine.  His chest x-ray shows no edema, and he has no signs or symptoms of fluid overload.   Dialysis Orders:  MWF @ [place] 4 hrs        88 kg      2K/2.25Ca       450/A1.5       Heparin 9800 U        AVG @ LFA No Hectorol         Aranesp 25 mcg q4wks         No Venofer  Past Medical History  Diagnosis Date  . Hyperparathyroidism   . Arthritis   . Hyperlipidemia   . Hypertension   . Neuromuscular disorder   . Stroke   . GERD (gastroesophageal reflux disease)   . Headache(784.0)     occasional  migraine   . Heart attack 2011; 1990's  . Type II diabetes mellitus   . Peripheral neuropathy   . Anxiety   . Depression   . Dialysis patient     M, W, F; [place] (09/19/11)  . Chronic kidney disease     [place]  MWF   Past Surgical History  Procedure Laterality Date  . Colon surgery  2011    colon resection   . Parathyroidectomy  2011  . Capd insertion  09/12/2011    Procedure: LAPAROSCOPIC INSERTION CONTINUOUS AMBULATORY PERITONEAL DIALYSIS  (CAPD) CATHETER;  Surgeon: Edward Jolly, MD;  Location: [place];  Service: General;  Laterality: N/A;  . Sp av dialysis shunt access existing *l*  2003  . Dg av dialysis shunt intro ndl*r* or      "not working" (09/19/11)  . Capd removal  03/12/2012    Procedure: CONTINUOUS AMBULATORY PERITONEAL DIALYSIS  (CAPD) CATHETER REMOVAL;  Surgeon: Edward Jolly, MD;  Location: [facility];  Service: General;  Laterality: N/A;   Family History  Problem Relation Age of Onset  . Cancer Sister     breast  . Cancer Brother     lung  . Anesthesia problems Neg Hx   . Hypotension Neg Hx   . Malignant hyperthermia Neg Hx   . Pseudochol deficiency Neg Hx    Social History He quit smoking cigarettes in 1975 after a 1.75 pack-year smoking history, but never used smokeless tobacco.  He occasionally drinks wine,  but denies any history of illicit drug use.  He lives alone, but has two daughters and one son, and he previously worked as a Nurse, children's.  Allergies  Allergen Reactions  . Benazepril Hcl Hives   Prior to Admission medications   Medication Sig Start Date End Date Taking? Authorizing Provider  aspirin 325 MG tablet Take 325 mg by mouth every evening.   Yes Historical Provider, MD  atorvastatin (LIPITOR) 20 MG tablet Take 20 mg by mouth at bedtime.   Yes Historical Provider, MD  b complex-vitamin c-folic acid (NEPHRO-VITE) 0.8 MG TABS Take 0.8 mg by mouth at bedtime.   Yes Historical Provider, MD  calcium carbonate (TUMS - DOSED IN MG ELEMENTAL CALCIUM) 500 MG chewable tablet Chew 1 tablet by mouth 3 (three) times daily with meals.   Yes Historical Provider, MD  carvedilol (COREG) 3.125 MG tablet Take 1 tablet (3.125 mg total) by mouth 2 (two) times  daily with a meal. 12/16/13  Yes Debbe Odea, MD  HYDROcodone-acetaminophen (NORCO/VICODIN) 5-325 MG per tablet Take 1 tablet by mouth every 6 (six) hours as needed for moderate pain or severe pain. 12/16/13  Yes Debbe Odea, MD  hydrOXYzine (ATARAX/VISTARIL) 10 MG tablet Take 10 mg by mouth at bedtime as needed. For sleep   Yes Historical Provider, MD  isosorbide-hydrALAZINE (BIDIL) 20-37.5 MG per tablet Take 1 tablet by mouth 2 (two) times daily. 12/16/13  Yes Debbe Odea, MD  OVER THE COUNTER MEDICATION Take 60 mLs by mouth daily. "Fruit of the Spirit."   Yes Historical Provider, MD  PARoxetine (PAXIL) 20 MG tablet Take 20 mg by mouth daily.   Yes Historical Provider, MD   Labs:  Results for orders placed during the hospital encounter of 12/20/13 (from the past 48 hour(s))  CBC WITH DIFFERENTIAL     Status: Abnormal   Collection Time    12/20/13 12:37 PM      Result Value Ref Range   WBC 7.8  4.0 - 10.5 K/uL   RBC 3.80 (*) 4.22 - 5.81 MIL/uL   Hemoglobin 10.3 (*) 13.0 - 17.0 g/dL   HCT 32.0 (*) 39.0 - 52.0 %   MCV 84.2  78.0 - 100.0 fL   MCH 27.1  26.0 - 34.0 pg   MCHC 32.2  30.0 - 36.0 g/dL   RDW 14.7  11.5 - 15.5 %   Platelets 316  150 - 400 K/uL   Neutrophils Relative % 77  43 - 77 %   Neutro Abs 5.9  1.7 - 7.7 K/uL   Lymphocytes Relative 11 (*) 12 - 46 %   Lymphs Abs 0.9  0.7 - 4.0 K/uL   Monocytes Relative 11  3 - 12 %   Monocytes Absolute 0.9  0.1 - 1.0 K/uL   Eosinophils Relative 1  0 - 5 %   Eosinophils Absolute 0.1  0.0 - 0.7 K/uL   Basophils Relative 0  0 - 1 %   Basophils Absolute 0.0  0.0 - 0.1 K/uL  COMPREHENSIVE METABOLIC PANEL     Status: Abnormal   Collection Time    12/20/13 12:37 PM      Result Value Ref Range   Sodium 136 (*) 137 - 147 mEq/L   Potassium 3.7  3.7 - 5.3 mEq/L   Chloride 91 (*) 96 - 112 mEq/L   CO2 25  19 - 32 mEq/L   Glucose, Bld 153 (*) 70 - 99 mg/dL   BUN 34 (*) 6 -  23 mg/dL   Creatinine, Ser 8.78 (*) 0.50 - 1.35 mg/dL   Calcium 8.9  8.4  - 10.5 mg/dL   Total Protein 8.0  6.0 - 8.3 g/dL   Albumin 2.7 (*) 3.5 - 5.2 g/dL   AST 20  0 - 37 U/L   ALT 13  0 - 53 U/L   Alkaline Phosphatase 94  39 - 117 U/L   Total Bilirubin 0.7  0.3 - 1.2 mg/dL   GFR calc non Af Amer 5 (*) >90 mL/min   GFR calc Af Amer 6 (*) >90 mL/min   Comment: (NOTE)     The eGFR has been calculated using the CKD EPI equation.     This calculation has not been validated in all clinical situations.     eGFR's persistently <90 mL/min signify possible Chronic Kidney     Disease.   Anion gap 20 (*) 5 - 15  GLUCOSE, CAPILLARY     Status: Abnormal   Collection Time    12/20/13  5:29 PM      Result Value Ref Range   Glucose-Capillary 111 (*) 70 - 99 mg/dL  GLUCOSE, CAPILLARY     Status: Abnormal   Collection Time    12/20/13  8:50 PM      Result Value Ref Range   Glucose-Capillary 152 (*) 70 - 99 mg/dL  CBC     Status: Abnormal   Collection Time    12/21/13  4:31 AM      Result Value Ref Range   WBC 7.9  4.0 - 10.5 K/uL   RBC 3.34 (*) 4.22 - 5.81 MIL/uL   Hemoglobin 8.9 (*) 13.0 - 17.0 g/dL   HCT 28.3 (*) 39.0 - 52.0 %   MCV 84.7  78.0 - 100.0 fL   MCH 26.6  26.0 - 34.0 pg   MCHC 31.4  30.0 - 36.0 g/dL   RDW 14.9  11.5 - 15.5 %   Platelets 304  150 - 400 K/uL  RENAL FUNCTION PANEL     Status: Abnormal   Collection Time    12/21/13  4:31 AM      Result Value Ref Range   Sodium 136 (*) 137 - 147 mEq/L   Potassium 3.8  3.7 - 5.3 mEq/L   Chloride 90 (*) 96 - 112 mEq/L   CO2 24  19 - 32 mEq/L   Glucose, Bld 113 (*) 70 - 99 mg/dL   BUN 41 (*) 6 - 23 mg/dL   Creatinine, Ser 9.76 (*) 0.50 - 1.35 mg/dL   Calcium 8.6  8.4 - 10.5 mg/dL   Phosphorus 4.2  2.3 - 4.6 mg/dL   Albumin 2.5 (*) 3.5 - 5.2 g/dL   GFR calc non Af Amer 5 (*) >90 mL/min   GFR calc Af Amer 5 (*) >90 mL/min   Comment: (NOTE)     The eGFR has been calculated using the CKD EPI equation.     This calculation has not been validated in all clinical situations.     eGFR's persistently  <90 mL/min signify possible Chronic Kidney     Disease.   Anion gap 22 (*) 5 - 15  TSH     Status: None   Collection Time    12/21/13  4:31 AM      Result Value Ref Range   TSH 1.050  0.350 - 4.500 uIU/mL  GLUCOSE, CAPILLARY     Status: Abnormal   Collection Time    12/21/13  7:41 AM      Result Value Ref Range   Glucose-Capillary 119 (*) 70 - 99 mg/dL   Comment 1 Notify RN     Comment 2 Documented in Chart     Constitutional: negative for chills, fatigue, fevers and sweats Ears, nose, mouth, throat, and face: negative for earaches, hoarseness, nasal congestion and sore throat Respiratory: negative for cough, dyspnea on exertion, hemoptysis and sputum Cardiovascular: negative for chest pain, chest pressure/discomfort, dyspnea, orthopnea and palpitations Gastrointestinal: positive for poor appetite, negative for abdominal pain, change in bowel habits, nausea and vomiting Genitourinary:negative, anuric Musculoskeletal:negative for arthralgias, back pain, myalgias and neck pain Neurological: negative for dizziness, gait problems, headaches, speech problems and weakness  Physical Exam: Filed Vitals:   12/21/13 0526  BP: 112/68  Pulse: 80  Temp: 98.7 F (37.1 C)  Resp: 17     General appearance: alert, cooperative and no distress Head: Normocephalic, without obvious abnormality, atraumatic Neck: no adenopathy, no carotid bruit, no JVD and supple, symmetrical, trachea midline Resp: clear to auscultation bilaterally Cardio: regular rate and rhythm, S1, S2 normal, no murmur, click, rub or gallop GI: soft, non-tender; bowel sounds normal; no masses,  no organomegaly Extremities: L leg wrapped from below knee to foot, no edema or wounds on R Neurologic: Grossly normal Dialysis Access: AVG @ LFA with + bruit   Assessment/Plan: 1. Left leg wounds - sec to calciphylaxis, pain better on IV Morphine, wound care; iPTH 802, P 2.9.   2. ESRD - HD on MWF @ [place], missed last Tx 7/10, K  3.7.  Next HD tomorrow on schedule. 3. Hypertension/volume - BP 112/68 on Bidil 20/37.5 mg bid & Carvedilol 3.125 mg bid since last admission; below EDW, CXR negative. 4. Anemia - Hgb 8.9, outpatient Aranesp 25 mcg q4wks.  Aranesp 100 mcg tomorrow. 5. Metabolic bone disease - Ca 8.6 (9.8 corrected), P 2.9 on Fosrenol @ home.  Change binder from CaCO3 to Fosrenol. 6. Nutrition - Alb 2.5, renal diet, multivitamin. 7. DM Type 2 - diet controlled, last Hgb A1C 6.3. 8. Hx CAD - evaluated for chest pain 7/4-7, LBBB per EKG, negative stress test, but decreasing EF, refused cardiac cath. 9. Hx CVA - most recently in 09/2011, also in 2000.   Beckey Polkowski 12/21/2013, 9:49 AM   Attending Nephrologist:  Erling Cruz, MD

## 2013-12-21 NOTE — Progress Notes (Signed)
[facility] PROGRESS NOTE  Sean [name] YIR:[id number] DOB: 09/09/1935 DOA: 12/20/2013 PCP: Philis Fendt, MD  Assessment/Plan:  Chronic calciphylaxis of the LLE: - WOUND care and pain control.    ESRD on HD: - further management as per renal.   Ischemic cardiomyopathy: EF 45-50%  -Continue BiDil and carvedilol  -Presently no chest pain  -continue ASA  Hyperlipidemia  -Continue Lipitor  Depression  -Continue Paxil  Diabetes mellitus type 2  -Diet controlled  -Hemoglobin A1c 6.3 on 12/14/13    Code Status: full code Family Communication: none at bedside Disposition Plan: pending.    Consultants:  RENAL  WOUND CARE  Procedures:  HD  Antibiotics:  none  HPI/Subjective: PAIN is better controlled.   Objective: Filed Vitals:   12/21/13 1801  BP: 112/80  Pulse: 77  Temp: 98.2 F (36.8 C)  Resp: 18    Intake/Output Summary (Last 24 hours) at 12/21/13 1812 Last data filed at 12/21/13 1700  Gross per 24 hour  Intake    720 ml  Output      0 ml  Net    720 ml   Filed Weights   12/20/13 2046  Weight: 87.201 kg (192 lb 3.9 oz)    Exam:   General:  Alert afebrile comfortable  Cardiovascular: s1s2  Respiratory: ctab  Abdomen: soft NT  ND BS+  Musculoskeletal: CHRONIC calciphylaxis of LLE.   Data Reviewed: Basic Metabolic Panel:  Recent Labs Lab 12/15/13 2049 12/16/13 0445 12/20/13 1237 12/21/13 0431  NA  --  138 136* 136*  K  --  3.5* 3.7 3.8  CL  --  96 91* 90*  CO2  --  28 25 24   GLUCOSE  --  154* 153* 113*  BUN  --  17 34* 41*  CREATININE  --  5.32* 8.78* 9.76*  CALCIUM  --  8.9 8.9 8.6  PHOS 2.9  --   --  4.2   Liver Function Tests:  Recent Labs Lab 12/20/13 1237 12/21/13 0431  AST 20  --   ALT 13  --   ALKPHOS 94  --   BILITOT 0.7  --   PROT 8.0  --   ALBUMIN 2.7* 2.5*   No results found for this basename: LIPASE, AMYLASE,  in the last 168 hours No results found for this basename: AMMONIA,  in the  last 168 hours CBC:  Recent Labs Lab 12/20/13 1237 12/21/13 0431  WBC 7.8 7.9  NEUTROABS 5.9  --   HGB 10.3* 8.9*  HCT 32.0* 28.3*  MCV 84.2 84.7  PLT 316 304   Cardiac Enzymes: No results found for this basename: CKTOTAL, CKMB, CKMBINDEX, TROPONINI,  in the last 168 hours BNP (last 3 results)  Recent Labs  12/13/13 1408  PROBNP >70000.0*   CBG:  Recent Labs Lab 12/20/13 1729 12/20/13 2050 12/21/13 0741 12/21/13 1227 12/21/13 1639  GLUCAP 111* 152* 119* 125* 135*    Recent Results (from the past 240 hour(s))  CULTURE, BLOOD (ROUTINE X 2)     Status: None   Collection Time    12/13/13  2:40 PM      Result Value Ref Range Status   Specimen Description BLOOD RIGHT ARM   Final   Special Requests BOTTLES DRAWN AEROBIC AND ANAEROBIC 10CC   Final   Culture  Setup Time     Final   Value: 12/13/2013 19:49     Performed at [place]     Final  Value: NO GROWTH 5 DAYS     Performed at [facility]   Report Status 12/19/2013 FINAL   Final  CULTURE, BLOOD (ROUTINE X 2)     Status: None   Collection Time    12/13/13  2:45 PM      Result Value Ref Range Status   Specimen Description BLOOD RIGHT HAND   Final   Special Requests BOTTLES DRAWN AEROBIC ONLY 5CC   Final   Culture  Setup Time     Final   Value: 12/13/2013 19:49     Performed at [facility]   Culture     Final   Value: NO GROWTH 5 DAYS     Performed at [facility]   Report Status 12/19/2013 FINAL   Final     Studies: Dg Chest 2 View  12/20/2013   CLINICAL DATA:  LEFT lower leg wound infection for 2 weeks, history dialysis, hypertension, diabetes, smoking, stroke  EXAM: CHEST  2 VIEW  COMPARISON:  12/14/2013  FINDINGS: Enlargement of cardiac silhouette.  Tortuous aorta.  Mediastinal contours and pulmonary vascularity normal.  Eventration central LEFT diaphragm with LEFT basilar atelectasis.  No acute infiltrate, pleural effusion or pneumothorax.  No acute  osseous findings.  IMPRESSION: LEFT basilar atelectasis.   Electronically Signed   By: Lavonia Dana M.D.   On: 12/20/2013 14:12    Scheduled Meds: . aspirin  325 mg Oral QPM  . atorvastatin  20 mg Oral QHS  . carvedilol  3.125 mg Oral BID WC  . collagenase  1 application Topical Daily  . [START ON 12/22/2013] darbepoetin (ARANESP) injection - DIALYSIS  100 mcg Intravenous Q Mon-HD  . docusate sodium  100 mg Oral BID  . heparin  5,000 Units Subcutaneous 3 times per day  . isosorbide-hydrALAZINE  1 tablet Oral BID  . lanthanum  1,000 mg Oral TID WC  . multivitamin  1 tablet Oral QHS  . PARoxetine  20 mg Oral Daily   Continuous Infusions:   Active Problems:   End stage renal disease   Calciphylaxis of left lower extremity with nonhealing ulcer, limited to breakdown of skin   CAD in native artery   Uncontrolled pain   Secondary hyperparathyroidism    Time spent: 25 minutes    [facility] Pager [phone] If 7PM-7AM, please contact night-coverage at [email or web address], password [facility] 12/21/2013, 6:12 PM  LOS: 1 day

## 2013-12-21 NOTE — Consult Note (Addendum)
WOC wound consult note  Reason for Consult:Chronic calciphylaxis of LLE. Last seen on 12/14/13 (8 days ago). No significant change in wound status at this time. Patient is not using Prevalon boot supplied last admission (last week).  Another is not ordered today. Wound type: Inflammatory, systemic disease related  Pressure Ulcer POA: No  Measurement:14cm x 13.5cm. Unable to determine depth due to the presence of stable, dry, black eschar that is firmly adherent. Small area of eschar has peeled back since last visit on 12/14/13, ,eauring 2.5 x 3.0cm missing revealing a pale pink wound bed of the same dimensions. Wound bed:95% obscured by the presence of stable,dry, black eschar that is firmly adherent  Drainage (amount, consistency, odor) None  Periwound:Intact with evidence of previous wound healing (scarring).  Dressing procedure/placement/frequency: I have reviewed the POC established by Dr. Elesa Hacker and continued by the outpatient wound care center at [facility] and will continue their intervention while in house. Topical wound care with an enzymatic debriding agent, collagenase (Santyl) is ordered for once daily.Patient should follow up as scheduled with both Dr. Lindon Romp and the staff at the outpatient wound care center at [facility] upon discharge, regardless of disposition (home of SNF). [place] nursing team will not follow, but will remain available to this patient, the nursing and medical team.  Please re-consult if needed. Thanks, Maudie Flakes, MSN, RN, [place], [place], [place] [phone])

## 2013-12-22 LAB — CBC
HCT: 28.9 % — ABNORMAL LOW (ref 39.0–52.0)
HEMOGLOBIN: 9.2 g/dL — AB (ref 13.0–17.0)
MCH: 26.9 pg (ref 26.0–34.0)
MCHC: 31.8 g/dL (ref 30.0–36.0)
MCV: 84.5 fL (ref 78.0–100.0)
Platelets: 299 10*3/uL (ref 150–400)
RBC: 3.42 MIL/uL — AB (ref 4.22–5.81)
RDW: 15 % (ref 11.5–15.5)
WBC: 6.8 10*3/uL (ref 4.0–10.5)

## 2013-12-22 LAB — RENAL FUNCTION PANEL
ALBUMIN: 2.6 g/dL — AB (ref 3.5–5.2)
ANION GAP: 21 — AB (ref 5–15)
BUN: 47 mg/dL — AB (ref 6–23)
CHLORIDE: 89 meq/L — AB (ref 96–112)
CO2: 24 mEq/L (ref 19–32)
Calcium: 8.3 mg/dL — ABNORMAL LOW (ref 8.4–10.5)
Creatinine, Ser: 11.41 mg/dL — ABNORMAL HIGH (ref 0.50–1.35)
GFR calc Af Amer: 4 mL/min — ABNORMAL LOW (ref >90)
GFR, EST NON AFRICAN AMERICAN: 4 mL/min — AB (ref >90)
Glucose, Bld: 121 mg/dL — ABNORMAL HIGH (ref 70–99)
Phosphorus: 5 mg/dL — ABNORMAL HIGH (ref 2.3–4.6)
Potassium: 3.8 mEq/L (ref 3.7–5.3)
Sodium: 134 mEq/L — ABNORMAL LOW (ref 137–147)

## 2013-12-22 LAB — GLUCOSE, CAPILLARY
GLUCOSE-CAPILLARY: 152 mg/dL — AB (ref 70–99)
Glucose-Capillary: 144 mg/dL — ABNORMAL HIGH (ref 70–99)

## 2013-12-22 MED ORDER — LIDOCAINE HCL (PF) 1 % IJ SOLN: 5.0000 mL | INTRAMUSCULAR | Status: DC | PRN

## 2013-12-22 MED ORDER — LIDOCAINE-PRILOCAINE 2.5-2.5 % EX CREA: 1.0000 "application " | TOPICAL_CREAM | CUTANEOUS | Status: DC | PRN

## 2013-12-22 MED ORDER — SODIUM CHLORIDE 0.9 % IV SOLN: 100.0000 mL | INTRAVENOUS | Status: DC | PRN

## 2013-12-22 MED ORDER — MORPHINE SULFATE 2 MG/ML IJ SOLN
INTRAMUSCULAR | Status: AC
  Filled 2013-12-22: qty 1

## 2013-12-22 MED ORDER — PENTAFLUOROPROP-TETRAFLUOROETH EX AERO: 1.0000 "application " | INHALATION_SPRAY | CUTANEOUS | Status: DC | PRN

## 2013-12-22 MED ORDER — ALTEPLASE 2 MG IJ SOLR
2.0000 mg | Freq: Once | INTRAMUSCULAR | Status: AC | PRN
  Filled 2013-12-22: qty 2

## 2013-12-22 MED ORDER — DIPHENHYDRAMINE HCL 25 MG PO CAPS
25.0000 mg | ORAL_CAPSULE | Freq: Every evening | ORAL | Status: DC | PRN
Start: 2013-12-22 — End: 2013-12-22

## 2013-12-22 MED ORDER — NEPRO/CARBSTEADY PO LIQD: 237.0000 mL | ORAL | Status: DC | PRN

## 2013-12-22 MED ORDER — DARBEPOETIN ALFA-POLYSORBATE 100 MCG/0.5ML IJ SOLN
INTRAMUSCULAR | Status: AC
  Filled 2013-12-22: qty 0.5

## 2013-12-22 MED ORDER — HEPARIN SODIUM (PORCINE) 1000 UNIT/ML DIALYSIS
9800.0000 [IU] | INTRAMUSCULAR | Status: DC | PRN
  Administered 2013-12-22: 9800 [IU] via INTRAVENOUS_CENTRAL

## 2013-12-22 MED ORDER — HEPARIN SODIUM (PORCINE) 1000 UNIT/ML DIALYSIS
1000.0000 [IU] | INTRAMUSCULAR | Status: DC | PRN
  Filled 2013-12-22: qty 1

## 2013-12-22 MED ORDER — ZOLPIDEM TARTRATE 5 MG PO TABS
5.0000 mg | ORAL_TABLET | Freq: Every evening | ORAL | Status: DC | PRN
  Administered 2013-12-22 (×2): 5 mg via ORAL
  Filled 2013-12-22 (×2): qty 1

## 2013-12-22 NOTE — Evaluation (Signed)
Physical Therapy Evaluation Patient Details Name: Sean [name] MRN: [id number] DOB: 09-20-1935 Today's Date: 12/22/2013   History of Present Illness  Pt admitted from dialysis center for uncontrolled L Leg pain  Clinical Impression  Pt admitted with/for uncontrolled L leg pain.  Pt related that he had had a hard time making it at home and that he knew he needed a lot more help at home to make it.  Pt currently limited functionally due to the problems listed. ( See problems list.)   Pt will benefit from PT to maximize function and safety in order to get ready for next venue listed below.     Follow Up Recommendations SNF:  Needs consistent assist if he should insist on going home.    Equipment Recommendations  Rolling walker with 5" wheels    Recommendations for Other Services       Precautions / Restrictions Precautions Precautions: Fall      Mobility  Bed Mobility Overal bed mobility: Needs Assistance Bed Mobility: Supine to Sit     Supine to sit: Min guard     General bed mobility comments: moderate use of the rail  Transfers Overall transfer level: Needs assistance Equipment used: Rolling walker (2 wheeled) Transfers: Sit to/from Stand Sit to Stand: Min assist         General transfer comment: cues for safe hand placement  Ambulation/Gait Ambulation/Gait assistance: Min assist Ambulation Distance (Feet): 18 Feet Assistive device: Rolling walker (2 wheeled) Gait Pattern/deviations: Step-through pattern;Antalgic Gait velocity: slow   General Gait Details: painful gait with pronounced limp on L LE  Stairs            Wheelchair Mobility    Modified Rankin (Stroke Patients Only)       Balance Overall balance assessment: Needs assistance   Sitting balance-Leahy Scale: Fair       Standing balance-Leahy Scale: Poor                               Pertinent Vitals/Pain Asked for pain meds during session    [place] expects to be discharged to:: Unsure Living Arrangements: Alone Available Help at Discharge: Other (Comment) (has not been able to find reliable PCA assist) Type of Home: House Home Access: Stairs to enter     Home Layout: One level Home Equipment: Cane - single point;Other (comment)      Prior Function Level of Independence: Independent with assistive device(s) (pt state being at home this time was not easy--made him thin)         Comments: Pt unable to make it to HD at times after last discharge     Hand Dominance        Extremity/Trunk Assessment               Lower Extremity Assessment: Overall WFL for tasks assessed   LLE Deficits / Details: painful so did not fully test. grossly >3+/5,  easily moves against gravity     Communication   Communication: No difficulties  Cognition Arousal/Alertness: Awake/alert Behavior During Therapy: WFL for tasks assessed/performed Overall Cognitive Status: Within Functional Limits for tasks assessed                      General Comments      Exercises        Assessment/Plan    PT Assessment Patient needs continued PT services  PT  Diagnosis Difficulty walking;Acute pain;Generalized weakness   PT Problem List Decreased strength;Decreased activity tolerance;Decreased mobility;Decreased knowledge of use of DME;Pain  PT Treatment Interventions DME instruction;Gait training;Functional mobility training;Therapeutic activities;Patient/family education   PT Goals (Current goals can be found in the Care Plan section) Acute Rehab PT Goals Patient Stated Goal: Find a way to live safely at home PT Goal Formulation: With patient Time For Goal Achievement: 01/05/14 Potential to Achieve Goals: Good    Frequency Min 3X/week   Barriers to discharge Decreased caregiver support      Co-evaluation               End of Session   Activity Tolerance: Patient tolerated treatment well;Patient limited  by pain Patient left: in chair;with call bell/phone within reach Nurse Communication: Mobility status         Time: 3013-1438 PT Time Calculation (min): 26 min   Charges:   PT Evaluation $Initial PT Evaluation Tier I: 1 Procedure PT Treatments $Gait Training: 8-22 mins   PT G Codes:          Guerino Caporale, Tessie Fass 12/22/2013, 5:24 PM 12/22/2013  Donnella Sham, [place] [phone]  (pager)

## 2013-12-22 NOTE — Progress Notes (Signed)
[facility] PROGRESS NOTE  Sean [name] AOZ:[id number] DOB: 1936/05/26 DOA: 12/20/2013 PCP: Philis Fendt, MD  Assessment/Plan:  Chronic calciphylaxis of the LLE: - WOUND care and pain control.  Wound care consulted and recommendations given. Pain is better controlled.    ESRD on HD: - further management as per renal.   Ischemic cardiomyopathy: EF 45-50%  -Continue BiDil and carvedilol  -Presently no chest pain  -continue ASA  Hyperlipidemia  -Continue Lipitor  Depression  -Continue Paxil  Diabetes mellitus type 2  -Diet controlled  -Hemoglobin A1c 6.3 on 12/14/13    Code Status: full code Family Communication: none at bedside Disposition Plan: pending PT eval.    Consultants:  RENAL  WOUND CARE  Procedures:  HD  Antibiotics:  none  HPI/Subjective: PAIN is better controlled.   Objective: Filed Vitals:   12/22/13 1203  BP: 148/80  Pulse: 90  Temp: 98.2 F (36.8 C)  Resp: 18    Intake/Output Summary (Last 24 hours) at 12/22/13 1536 Last data filed at 12/22/13 1118  Gross per 24 hour  Intake    360 ml  Output   2000 ml  Net  -1640 ml   Filed Weights   12/21/13 2108 12/22/13 0718 12/22/13 1118  Weight: 90 kg (198 lb 6.6 oz) 86.1 kg (189 lb 13.1 oz) 83.9 kg (184 lb 15.5 oz)    Exam:   General:  Alert afebrile comfortable  Cardiovascular: s1s2  Respiratory: ctab  Abdomen: soft NT  ND BS+  Musculoskeletal: CHRONIC calciphylaxis of LLE.   Data Reviewed: Basic Metabolic Panel:  Recent Labs Lab 12/15/13 2049 12/16/13 0445 12/20/13 1237 12/21/13 0431 12/22/13 0720  NA  --  138 136* 136* 134*  K  --  3.5* 3.7 3.8 3.8  CL  --  96 91* 90* 89*  CO2  --  28 25 24 24   GLUCOSE  --  154* 153* 113* 121*  BUN  --  17 34* 41* 47*  CREATININE  --  5.32* 8.78* 9.76* 11.41*  CALCIUM  --  8.9 8.9 8.6 8.3*  PHOS 2.9  --   --  4.2 5.0*   Liver Function Tests:  Recent Labs Lab 12/20/13 1237 12/21/13 0431 12/22/13 0720  AST  20  --   --   ALT 13  --   --   ALKPHOS 94  --   --   BILITOT 0.7  --   --   PROT 8.0  --   --   ALBUMIN 2.7* 2.5* 2.6*   No results found for this basename: LIPASE, AMYLASE,  in the last 168 hours No results found for this basename: AMMONIA,  in the last 168 hours CBC:  Recent Labs Lab 12/20/13 1237 12/21/13 0431 12/22/13 0720  WBC 7.8 7.9 6.8  NEUTROABS 5.9  --   --   HGB 10.3* 8.9* 9.2*  HCT 32.0* 28.3* 28.9*  MCV 84.2 84.7 84.5  PLT 316 304 299   Cardiac Enzymes: No results found for this basename: CKTOTAL, CKMB, CKMBINDEX, TROPONINI,  in the last 168 hours BNP (last 3 results)  Recent Labs  12/13/13 1408  PROBNP >70000.0*   CBG:  Recent Labs Lab 12/20/13 2050 12/21/13 0741 12/21/13 1227 12/21/13 1639 12/21/13 2104  GLUCAP 152* 119* 125* 135* 127*    Recent Results (from the past 240 hour(s))  CULTURE, BLOOD (ROUTINE X 2)     Status: None   Collection Time    12/13/13  2:40 PM  Result Value Ref Range Status   Specimen Description BLOOD RIGHT ARM   Final   Special Requests BOTTLES DRAWN AEROBIC AND ANAEROBIC 10CC   Final   Culture  Setup Time     Final   Value: 12/13/2013 19:49     Performed at [facility]   Culture     Final   Value: NO GROWTH 5 DAYS     Performed at [facility]   Report Status 12/19/2013 FINAL   Final  CULTURE, BLOOD (ROUTINE X 2)     Status: None   Collection Time    12/13/13  2:45 PM      Result Value Ref Range Status   Specimen Description BLOOD RIGHT HAND   Final   Special Requests BOTTLES DRAWN AEROBIC ONLY 5CC   Final   Culture  Setup Time     Final   Value: 12/13/2013 19:49     Performed at [facility]   Culture     Final   Value: NO GROWTH 5 DAYS     Performed at [facility]   Report Status 12/19/2013 FINAL   Final     Studies: No results found.  Scheduled Meds: . aspirin  325 mg Oral QPM  . atorvastatin  20 mg Oral QHS  . carvedilol  3.125 mg Oral BID WC  .  collagenase  1 application Topical Daily  . darbepoetin (ARANESP) injection - DIALYSIS  100 mcg Intravenous Q Mon-HD  . docusate sodium  100 mg Oral BID  . heparin  5,000 Units Subcutaneous 3 times per day  . isosorbide-hydrALAZINE  1 tablet Oral BID  . lanthanum  1,000 mg Oral TID WC  . multivitamin  1 tablet Oral QHS  . PARoxetine  20 mg Oral Daily   Continuous Infusions:   Active Problems:   End stage renal disease   Calciphylaxis of left lower extremity with nonhealing ulcer, limited to breakdown of skin   CAD in native artery   Uncontrolled pain   Secondary hyperparathyroidism    Time spent: 25 minutes    [facility] Pager [phone] If 7PM-7AM, please contact night-coverage at [email or web address], password [facility] 12/22/2013, 3:36 PM  LOS: 2 days

## 2013-12-22 NOTE — Progress Notes (Signed)
Subjective:   Doing well. No complaints  Objective Filed Vitals:   12/22/13 0930 12/22/13 1000 12/22/13 1030 12/22/13 1100  BP: 136/81 144/84 141/74 128/80  Pulse: 88 89 84 88  Temp:      TempSrc:      Resp:      Height:      Weight:      SpO2:       Physical Exam General: alert and oriented. No acute distress. Heart: RRR, no murmur Lungs: CTA, unlabored Abdomen: soft, nontender +BS Extremities: no edema. L leg ace wrapped.  Dialysis Access:  L AVG patent on HD  HD: [facility] 4 hrs  88kgs  2K/2.25Ca   450/AF1.5   L AVG    9800heparin Aranesp 25 q 4 weeks (given 7/8)  Assessment/Plan: 1. Left leg wounds - calciphylaxis, pain controlled on IV Morphine, wound care; iPTH 802, P 5. Wound nurse following 2. ESRD - HD on MWF @ [place], missed last Tx 7/10, K 3.8. HD today, UF goal 2500 3. Hypertension/volume - BP 128/80 on Bidil 20/37.5 mg bid & Carvedilol 3.125 mg bid since last admission; below EDW, CXR negative.  4. Anemia - Hgb 9.2, outpatient Aranesp 25 mcg q4wks. Aranesp 100 mcg started 7/13 5. Metabolic bone disease - Ca 8.3 (9.8 corrected), P 5 on Fosrenol @ home. Change binder from CaCO3 to Fosrenol. Nutrition - Alb 2.6, renal diet, multivitamin. DM Type 2 - diet controlled, last Hgb A1C 6.3.  6. Hx CAD - evaluated for chest pain 7/4-7, LBBB per EKG, negative stress test, but decreasing EF, refused cardiac cath.  7. Hx CVA - most recently in 09/2011, also in 2000.  Shelle Iron, NP [place] [phone] 12/22/2013,11:17 AM  LOS: 2 days   Pt seen, examined and agree w A/P as above.  [name] MD pager [phone]    cell [phone] 12/22/2013, 4:40 PM    Additional Objective Labs: Basic Metabolic Panel:  Recent Labs Lab 12/15/13 2049  12/20/13 1237 12/21/13 0431 12/22/13 0720  NA  --   < > 136* 136* 134*  K  --   < > 3.7 3.8 3.8  CL  --   < > 91* 90* 89*  CO2  --   < > 25 24 24   GLUCOSE  --   < > 153* 113* 121*  BUN  --   < > 34* 41*  47*  CREATININE  --   < > 8.78* 9.76* 11.41*  CALCIUM  --   < > 8.9 8.6 8.3*  PHOS 2.9  --   --  4.2 5.0*  < > = values in this interval not displayed. Liver Function Tests:  Recent Labs Lab 12/20/13 1237 12/21/13 0431 12/22/13 0720  AST 20  --   --   ALT 13  --   --   ALKPHOS 94  --   --   BILITOT 0.7  --   --   PROT 8.0  --   --   ALBUMIN 2.7* 2.5* 2.6*   No results found for this basename: LIPASE, AMYLASE,  in the last 168 hours CBC:  Recent Labs Lab 12/20/13 1237 12/21/13 0431 12/22/13 0720  WBC 7.8 7.9 6.8  NEUTROABS 5.9  --   --   HGB 10.3* 8.9* 9.2*  HCT 32.0* 28.3* 28.9*  MCV 84.2 84.7 84.5  PLT 316 304 299   Blood Culture    Component Value Date/Time   SDES BLOOD RIGHT HAND 12/13/2013 1445  SPECREQUEST BOTTLES DRAWN AEROBIC ONLY 5CC 12/13/2013 1445   CULT  Value: NO GROWTH 5 DAYS Performed at [facility] 12/13/2013 1445   REPTSTATUS 12/19/2013 FINAL 12/13/2013 1445    Cardiac Enzymes: No results found for this basename: CKTOTAL, CKMB, CKMBINDEX, TROPONINI,  in the last 168 hours CBG:  Recent Labs Lab 12/20/13 2050 12/21/13 0741 12/21/13 1227 12/21/13 1639 12/21/13 2104  GLUCAP 152* 119* 125* 135* 127*   Iron Studies: No results found for this basename: IRON, TIBC, TRANSFERRIN, FERRITIN,  in the last 72 hours @lablastinr3 @ Studies/Results: Dg Chest 2 View  12/20/2013   CLINICAL DATA:  LEFT lower leg wound infection for 2 weeks, history dialysis, hypertension, diabetes, smoking, stroke  EXAM: CHEST  2 VIEW  COMPARISON:  12/14/2013  FINDINGS: Enlargement of cardiac silhouette.  Tortuous aorta.  Mediastinal contours and pulmonary vascularity normal.  Eventration central LEFT diaphragm with LEFT basilar atelectasis.  No acute infiltrate, pleural effusion or pneumothorax.  No acute osseous findings.  IMPRESSION: LEFT basilar atelectasis.   Electronically Signed   By: Lavonia Dana M.D.   On: 12/20/2013 14:12   Medications:   . aspirin  325 mg Oral  QPM  . atorvastatin  20 mg Oral QHS  . carvedilol  3.125 mg Oral BID WC  . collagenase  1 application Topical Daily  . darbepoetin (ARANESP) injection - DIALYSIS  100 mcg Intravenous Q Mon-HD  . docusate sodium  100 mg Oral BID  . heparin  5,000 Units Subcutaneous 3 times per day  . isosorbide-hydrALAZINE  1 tablet Oral BID  . lanthanum  1,000 mg Oral TID WC  . multivitamin  1 tablet Oral QHS  . PARoxetine  20 mg Oral Daily

## 2013-12-23 LAB — GLUCOSE, CAPILLARY
Glucose-Capillary: 103 mg/dL — ABNORMAL HIGH (ref 70–99)
Glucose-Capillary: 133 mg/dL — ABNORMAL HIGH (ref 70–99)
Glucose-Capillary: 130 mg/dL — ABNORMAL HIGH (ref 70–99)

## 2013-12-23 MED ORDER — SODIUM CHLORIDE 0.9 % IV SOLN: 100.0000 mL | INTRAVENOUS | Status: DC | PRN

## 2013-12-23 MED ORDER — LIDOCAINE-PRILOCAINE 2.5-2.5 % EX CREA: 1.0000 "application " | TOPICAL_CREAM | CUTANEOUS | Status: DC | PRN

## 2013-12-23 MED ORDER — HEPARIN SODIUM (PORCINE) 1000 UNIT/ML DIALYSIS: 1000.0000 [IU] | INTRAMUSCULAR | Status: DC | PRN

## 2013-12-23 MED ORDER — OXYCODONE HCL 5 MG PO TABS
5.0000 mg | ORAL_TABLET | ORAL | Status: DC | PRN
  Administered 2013-12-24 (×2): 10 mg via ORAL
  Filled 2013-12-23 (×2): qty 2

## 2013-12-23 MED ORDER — LIDOCAINE HCL (PF) 1 % IJ SOLN: 5.0000 mL | INTRAMUSCULAR | Status: DC | PRN

## 2013-12-23 MED ORDER — NEPRO/CARBSTEADY PO LIQD: 237.0000 mL | ORAL | Status: DC | PRN

## 2013-12-23 MED ORDER — ALTEPLASE 2 MG IJ SOLR: 2.0000 mg | Freq: Once | INTRAMUSCULAR | Status: AC | PRN

## 2013-12-23 MED ORDER — HEPARIN SODIUM (PORCINE) 1000 UNIT/ML DIALYSIS
100.0000 [IU]/kg | Freq: Once | INTRAMUSCULAR | Status: AC
  Administered 2013-12-24: 8600 [IU] via INTRAVENOUS_CENTRAL

## 2013-12-23 MED ORDER — PENTAFLUOROPROP-TETRAFLUOROETH EX AERO: 1.0000 "application " | INHALATION_SPRAY | CUTANEOUS | Status: DC | PRN

## 2013-12-23 NOTE — Progress Notes (Signed)
Occupational Therapy Evaluation Patient Details Name: Sean [name] MRN: [id number] DOB: 04-15-1936 Today's Date: 12/23/2013    History of Present Illness Pt admitted from dialysis center for uncontrolled L Leg pain   Clinical Impression   Patient presents to OT with decreased ADL independence and safety. Will benefit from skilled OT to maximize independence and facilitate safe discharge.    Follow Up Recommendations  SNF    Equipment Recommendations  Other (comment) (tbd at next venue of care)    Recommendations for Other Services PT consult     Precautions / Restrictions Precautions Precautions: Fall      Mobility Bed Mobility                  Transfers                      Balance                                            ADL Overall ADL's : Needs assistance/impaired Eating/Feeding: Independent;Sitting   Grooming: Minimal assistance;Applying deodorant;Wash/dry face;Wash/dry hands   Upper Body Bathing: Minimal assitance;Sitting   Lower Body Bathing: Maximal assistance;Sitting/lateral leans       Lower Body Dressing: Total assistance               Functional mobility during ADLs:  (pt up in recliner, declined standing up/transfers on eval) General ADL Comments: Patient limited by pain and ? contracture L knee. Also with decreased sensation and coordination B hands.     Vision                     Perception     Praxis      Pertinent Vitals/Pain C/o LLE pain with movement, but reports it is ok at rest     Hand Dominance Right   Extremity/Trunk Assessment Upper Extremity Assessment Upper Extremity Assessment:  (AROM/strength overall WFL) RUE Sensation: decreased light touch (numbness and tingling) RUE Coordination: decreased fine motor LUE Sensation: decreased light touch (numbness and tingling) LUE Coordination: decreased fine motor   Lower Extremity Assessment Lower Extremity Assessment:  Defer to PT evaluation       Communication Communication Communication: No difficulties   Cognition Arousal/Alertness: Awake/alert Behavior During Therapy: WFL for tasks assessed/performed Overall Cognitive Status: Within Functional Limits for tasks assessed                     General Comments       Exercises       Shoulder Instructions      Home Living Family/patient expects to be discharged to:: Unsure Living Arrangements: Alone Available Help at Discharge: Other (Comment) (has not been able to find reliable PCA assist) Type of Home: House Home Access: Stairs to enter     Home Layout: One level               Home Equipment: Cane - single point;Other (comment)          Prior Functioning/Environment Level of Independence: Independent with assistive device(s) (pt states being at home was not easy)        Comments: Pt unable to make it to HD at times after last discharge    OT Diagnosis: Generalized weakness;Acute pain   OT Problem List: Decreased strength;Decreased range of motion;Decreased activity tolerance;Impaired balance (sitting  and/or standing);Decreased safety awareness;Decreased knowledge of use of DME or AE;Pain   OT Treatment/Interventions: Self-care/ADL training;Therapeutic exercise;DME and/or AE instruction;Therapeutic activities;Patient/family education    OT Goals(Current goals can be found in the care plan section)    OT Frequency: Min 2X/week   Barriers to D/C: Decreased caregiver support          Co-evaluation              End of Session    Activity Tolerance: Patient limited by pain Patient left: in chair;with call bell/phone within reach   Time:  1407- 1428   Charges:    G-Codes:    Advith Martine A Jan 06, 2014, 3:08 PM

## 2013-12-23 NOTE — Clinical Social Work Psychosocial (Signed)
Clinical Social Work Department BRIEF PSYCHOSOCIAL ASSESSMENT 12/23/2013  Patient:  Sean [name], Sean [name]     Account Number:  [id number]     Admit date:  12/20/2013  Clinical Social Worker:  Frederico Hamman  Date/Time:  12/23/2013 01:57 AM  Referred by:  Physician  Date Referred:  12/23/2013 Referred for  SNF Placement   Other Referral:   Interview type:   Other interview type:    PSYCHOSOCIAL DATA Living Status:  ALONE Admitted from facility:   Level of care:   Primary support name:  Sean [name] Primary support relationship to patient:  CHILD, ADULT Degree of support available:   Good support. Patient stated that he has 2 sons and 2 daughters and gave the name of one of his sons that helps him out.    CURRENT CONCERNS Current Concerns  Post-Acute Placement   Other Concerns:    SOCIAL WORK ASSESSMENT / PLAN CSW talked with patient at the bedside regarding discharge planning and MD recommendation of ST rehab. Patient voiced agreement and gave [facility] as his preference. Sean [name] advised that he is ready for discharge today.   Assessment/plan status:  Psychosocial Support/Ongoing Assessment of Needs Other assessment/ plan:   Information/referral to community resources:   Patient provided with list of skilled facilities in [facility]    PATIENT'S/FAMILY'S [place]: Sean [name] was alert, oriented and willing to talk with CSW about discharge planning. He is in agreement with ST rehab and has a facility preference.

## 2013-12-23 NOTE — Discharge Summary (Signed)
Physician Discharge Summary  Sean [name] [id number]:[id number] DOB: 08/24/1935 DOA: 12/20/2013  PCP: Philis Fendt, MD  Admit date: 12/20/2013 Discharge date: 12/23/2013  Time spent: 30 minutes  Recommendations for Outpatient Follow-up:  1. Wound clinic follow up as recommended. With Dr Lindon Romp  2. Follow up with renal as recommended.  3. Follow up with PCP as recommended.   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   Diet recommendation: renal diet.  Filed Weights   12/22/13 0718 12/22/13 1118 12/22/13 2147  Weight: 86.1 kg (189 lb 13.1 oz) 83.9 kg (184 lb 15.5 oz) 86.2 kg (190 lb 0.6 oz)    History of present illness:  78 year old male with a history of ESRD, coronary artery disease with MI in 2011, hypertension, hyperlipidemia presents with worsening left lower extremity pain since he was discharged from the hospital on 12/16/2013. The patient had a nuclear medicine stress test on 12/14/2013 which did not show any reversible ischemia but showed a EF of 25%. Echocardiogram on 12/15/2013 showed EF 45-50% which was decreased from his previous echocardiogram in April 2013 which showed an EF 65-70%. The patient was started on carvedilol and BiDil. The patient refused cardiac catheterization. He represents today because of uncontrolled pain in his left lower extremity. He was found to have calciphylaxis of the left lower extremity. He was admitted to medical service for further recommendations. He was started on IV morphine and transitioned to po oxycodone. His pain is better controlled. Wound care consulted and recommendations given. Outpatient follow up with wound clinic and wound care with santyl daily.    Hospital Course:   Chronic calciphylaxis of the LLE:  -  WOUND care and pain control.  Wound care consulted and recommendations given. Pain is better controlled.  ESRD on HD:  - further management as per renal.  Ischemic cardiomyopathy:  EF 45-50%  -Continue BiDil and carvedilol  -Presently no chest pain  -continue ASA  Hyperlipidemia  -Continue Lipitor  Depression  -Continue Paxil  Diabetes mellitus type 2  -Diet controlled  -Hemoglobin A1c 6.3 on 12/14/13   DVT prophylaxis.     Procedures:  HD  Consultations:  Renal  Wound care  Discharge Exam: Filed Vitals:   12/23/13 0926  BP: 106/63  Pulse: 86  Temp: 97.9 F (36.6 C)  Resp: 19    General: alert afebrile comfortable Cardiovascular: s1s2 Respiratory: ctab  Discharge Instructions You were cared for by a hospitalist during your hospital stay. If you have any questions about your discharge medications or the care you received while you were in the hospital after you are discharged, you can call the unit and asked to speak with the hospitalist on call if the hospitalist that took care of you is not available. Once you are discharged, your primary care physician will handle any further medical issues. Please note that NO REFILLS for any discharge medications will be authorized once you are discharged, as it is imperative that you return to your primary care physician (or establish a relationship with a primary care physician if you do not have one) for your aftercare needs so that they can reassess your need for medications and monitor your lab values.     Medication List    ASK your doctor about these medications  aspirin 325 MG tablet  Take 325 mg by mouth every evening.     atorvastatin 20 MG tablet  Commonly known as:  LIPITOR  Take 20 mg by mouth at bedtime.     b complex-vitamin c-folic acid 0.8 MG Tabs tablet  Take 0.8 mg by mouth at bedtime.     calcium carbonate 500 MG chewable tablet  Commonly known as:  TUMS - dosed in mg elemental calcium  Chew  1 tablet by mouth 3 (three) times daily with meals.     carvedilol 3.125 MG tablet  Commonly known as:  COREG  Take 1 tablet (3.125 mg total) by mouth 2 (two) times daily with a meal.     HYDROcodone-acetaminophen 5-325 MG per tablet  Commonly known as:  NORCO/VICODIN  Take 1 tablet by mouth every 6 (six) hours as needed for moderate pain or severe pain.     hydrOXYzine 10 MG tablet  Commonly known as:  ATARAX/VISTARIL  Take 10 mg by mouth at bedtime as needed. For sleep     isosorbide-hydrALAZINE 20-37.5 MG per tablet  Commonly known as:  BIDIL  Take 1 tablet by mouth 2 (two) times daily.     OVER THE COUNTER MEDICATION  Take 60 mLs by mouth daily. "Fruit of the Spirit."     PARoxetine 20 MG tablet  Commonly known as:  PAXIL  Take 20 mg by mouth daily.       Allergies  Allergen Reactions  . Benazepril Hcl Hives      The results of significant diagnostics from this hospitalization (including imaging, microbiology, ancillary and laboratory) are listed below for reference.    Significant Diagnostic Studies: Dg Chest 2 View  12/20/2013   CLINICAL DATA:  LEFT lower leg wound infection for 2 weeks, history dialysis, hypertension, diabetes, smoking, stroke  EXAM: CHEST  2 VIEW  COMPARISON:  12/14/2013  FINDINGS: Enlargement of cardiac silhouette.  Tortuous aorta.  Mediastinal contours and pulmonary vascularity normal.  Eventration central LEFT diaphragm with LEFT basilar atelectasis.  No acute infiltrate, pleural effusion or pneumothorax.  No acute osseous findings.  IMPRESSION: LEFT basilar atelectasis.   Electronically Signed   By: Lavonia Dana M.D.   On: 12/20/2013 14:12   Ct Head Wo Contrast  12/13/2013   CLINICAL DATA:  Headache.  EXAM: CT HEAD WITHOUT CONTRAST  TECHNIQUE: Contiguous axial images were obtained from the base of the skull through the vertex without intravenous contrast.  COMPARISON:  09/19/2011  FINDINGS: Old left parietal infarct with encephalomalacia. Diffuse  cerebral atrophy and severe chronic microvascular changes throughout the deep white matter. No acute infarction. No mass lesion. No hemorrhage or hydrocephalus.  Visualized paranasal sinuses and mastoids clear. Orbital soft tissues unremarkable. No acute calvarial abnormality.  IMPRESSION: Old left posterior parietal infarct with encephalomalacia.  Atrophy, chronic small vessel disease.  No acute intracranial abnormality.   Electronically Signed   By: Rolm Baptise M.D.   On: 12/13/2013 21:07   Nm Myocar Multi W/spect W/wall Motion / Ef  12/14/2013   CLINICAL DATA:  78 year old with current history of hypertension and end-stage renal disease on hemodialysis, presenting this admission with chest pain and mildly elevated cardiac enzymes.  EXAM: MYOCARDIAL IMAGING WITH SPECT (REST AND PHARMACOLOGIC-STRESS)  GATED LEFT VENTRICULAR WALL MOTION STUDY  LEFT VENTRICULAR EJECTION FRACTION  TECHNIQUE: Standard myocardial SPECT imaging was performed after resting intravenous injection of 10 mCi Tc-6m sestamibi. Subsequently, intravenous infusion of Lexiscan was performed under the supervision of the Cardiology staff.  At peak effect of the drug, 30 mCi Tc-46m sestamibi was injected intravenously and standard myocardial SPECT imaging was performed. Quantitative gated imaging was also performed to evaluate left ventricular wall motion, and estimate left ventricular ejection fraction.  COMPARISON:  None.  FINDINGS: Immediate post regadenoson images demonstrated diminished activity in the inferior wall, anteroapical wall and apicoseptal wall. Initial resting images demonstrate similar findings. No evidence of reversibility to suggest ischemia. Findings confirmed by the computer generated polar map.  Gated images demonstrate global hypokinesis with essential akinesis involving the inferior wall.  Estimated QGS left ventricular ejection fraction measured 25%, with an end-diastolic volume of 643 ml and an end systolic volume of 329  ml.  IMPRESSION: 1. Prior MI involving the inferior wall and apex. 2. No evidence of myocardial ischemia. 3. Left ventricular enlargement. 4. Global hypokinesis with essential akinesis in the inferior wall. 5. Estimated QGS ejection fraction 25%.   Electronically Signed   By: Evangeline Dakin M.D.   On: 12/14/2013 14:43   Dg Chest Port 1 View  12/14/2013   CLINICAL DATA:  Shortness of breath  EXAM: PORTABLE CHEST - 1 VIEW  COMPARISON:  12/13/2013  FINDINGS: Cardiac shadow is stable. The lungs are well aerated bilaterally. Mild interstitial changes are seen without focal confluent infiltrate. No bony abnormality is noted.  IMPRESSION: No change from the prior exam.   Electronically Signed   By: Inez Catalina M.D.   On: 12/14/2013 08:00   Dg Chest Port 1 View  12/13/2013   CLINICAL DATA:  Left-sided chest pressure  EXAM: PORTABLE CHEST - 1 VIEW  COMPARISON:  02/20/2012  FINDINGS: There is no focal parenchymal opacity, pleural effusion, or pneumothorax. The heart and mediastinal contours are unremarkable. There is thoracic aortic atherosclerosis.  There is mild cardiomegaly.  The osseous structures are unremarkable.  IMPRESSION: No active disease.   Electronically Signed   By: Kathreen Devoid   On: 12/13/2013 14:33    Microbiology: Recent Results (from the past 240 hour(s))  CULTURE, BLOOD (ROUTINE X 2)     Status: None   Collection Time    12/13/13  2:40 PM      Result Value Ref Range Status   Specimen Description BLOOD RIGHT ARM   Final   Special Requests BOTTLES DRAWN AEROBIC AND ANAEROBIC 10CC   Final   Culture  Setup Time     Final   Value: 12/13/2013 19:49     Performed at [facility]   Culture     Final   Value: NO GROWTH 5 DAYS     Performed at [facility]   Report Status 12/19/2013 FINAL   Final  CULTURE, BLOOD (ROUTINE X 2)     Status: None   Collection Time    12/13/13  2:45 PM      Result Value Ref Range Status   Specimen Description BLOOD RIGHT HAND   Final    Special Requests BOTTLES DRAWN AEROBIC ONLY 5CC   Final   Culture  Setup Time     Final   Value: 12/13/2013 19:49     Performed at [facility]   Culture     Final   Value: NO GROWTH 5 DAYS     Performed at [facility]   Report Status 12/19/2013 FINAL   Final     Labs: Basic Metabolic Panel:  Recent Labs Lab 12/20/13 1237 12/21/13 0431 12/22/13 0720  NA 136* 136* 134*  K 3.7 3.8 3.8  CL 91* 90* 89*  CO2 25 24 24   GLUCOSE 153* 113* 121*  BUN 34* 41* 47*  CREATININE 8.78* 9.76* 11.41*  CALCIUM 8.9 8.6 8.3*  PHOS  --  4.2 5.0*   Liver Function Tests:  Recent Labs Lab 12/20/13 1237 12/21/13 0431 12/22/13 0720  AST 20  --   --   ALT 13  --   --   ALKPHOS 94  --   --   BILITOT 0.7  --   --   PROT 8.0  --   --   ALBUMIN 2.7* 2.5* 2.6*   No results found for this basename: LIPASE, AMYLASE,  in the last 168 hours No results found for this basename: AMMONIA,  in the last 168 hours CBC:  Recent Labs Lab 12/20/13 1237 12/21/13 0431 12/22/13 0720  WBC 7.8 7.9 6.8  NEUTROABS 5.9  --   --   HGB 10.3* 8.9* 9.2*  HCT 32.0* 28.3* 28.9*  MCV 84.2 84.7 84.5  PLT 316 304 299   Cardiac Enzymes: No results found for this basename: CKTOTAL, CKMB, CKMBINDEX, TROPONINI,  in the last 168 hours BNP: BNP (last 3 results)  Recent Labs  12/13/13 1408  PROBNP >70000.0*   CBG:  Recent Labs Lab 12/21/13 2104 12/22/13 1605 12/22/13 2145 12/23/13 0743 12/23/13 1202  GLUCAP 127* 152* 144* 103* 133*       Signed:  Raye Slyter  [facility] 12/23/2013, 2:39 PM

## 2013-12-23 NOTE — Progress Notes (Signed)
Subjective:   Doing well, had a good night  Objective Filed Vitals:   12/22/13 1203 12/22/13 1801 12/22/13 2147 12/23/13 0521  BP: 148/80 107/65 110/66 133/56  Pulse: 90 84 84 90  Temp: 98.2 F (36.8 C) 97.9 F (36.6 C) 98.6 F (37 C) 98.8 F (37.1 C)  TempSrc: Oral Oral Oral Oral  Resp: 18 17 18 18   Height:      Weight:   86.2 kg (190 lb 0.6 oz)   SpO2:  96% 94% 94%   Physical Exam General: alert and oriented. No acute distress Heart: RRR Lungs: CTA, unlabored Abdomen: soft, nontender +BS Extremities: L leg ACE wrapped. No edema Dialysis Access: L AVG +bruit  HD: [facility]  4 hrs 88kgs 2K/2.25Ca 450/AF1.5 L AVG 9800heparin  Aranesp 25 q 4 weeks (given 7/8)   Assessment/Plan:  1. Left leg wounds - calciphylaxis, pain controlled on IV Morphine, wound care; iPTH 802, P 5. Wound care eval- continue Santyl 2. ESRD - HD on MWF @ [place], missed last Tx 7/10, K 3.8. HD tomorrow 3. Hypertension/volume - BP 133/56 on Bidil 20/37.5 mg bid & Carvedilol 3.125 mg bid since last admission; below EDW, CXR negative.  4. Anemia - Hgb 9.2, outpatient Aranesp 25 mcg q4wks. Aranesp 100 mcg started 7/13 5. Metabolic bone disease - Ca 8.3 (9.8 corrected), P 5 on Fosrenol @ home. Change binder from CaCO3 to Fosrenol.  6. Nutrition - Alb 2.6, renal diet, multivitamin. DM Type 2 - diet controlled, last Hgb A1C 6.3.  7. Hx CAD - evaluated for chest pain 7/4-7, LBBB per EKG, negative stress test, but decreasing EF, refused cardiac cath.  8. Hx CVA - most recently in 09/2011, also in 2000.  Shelle Iron, NP [place] [phone] 12/23/2013,9:06 AM  LOS: 3 days   Pt seen, examined and agree w A/P as above. HIgh PTH in setting of active calciphylaxis is an indication to look for his fourth parathyroid gland and remove it if found.  He had parathyroidectomy of 3 glands in 2010, the 4th was not found and no transplant was done.  The PTH now is high at around 800.  Will consult gen  surgery in am.  [name] MD pager [phone]    cell [phone] 12/23/2013, 3:06 PM    Additional Objective Labs: Basic Metabolic Panel:  Recent Labs Lab 12/20/13 1237 12/21/13 0431 12/22/13 0720  NA 136* 136* 134*  K 3.7 3.8 3.8  CL 91* 90* 89*  CO2 25 24 24   GLUCOSE 153* 113* 121*  BUN 34* 41* 47*  CREATININE 8.78* 9.76* 11.41*  CALCIUM 8.9 8.6 8.3*  PHOS  --  4.2 5.0*   Liver Function Tests:  Recent Labs Lab 12/20/13 1237 12/21/13 0431 12/22/13 0720  AST 20  --   --   ALT 13  --   --   ALKPHOS 94  --   --   BILITOT 0.7  --   --   PROT 8.0  --   --   ALBUMIN 2.7* 2.5* 2.6*   No results found for this basename: LIPASE, AMYLASE,  in the last 168 hours CBC:  Recent Labs Lab 12/20/13 1237 12/21/13 0431 12/22/13 0720  WBC 7.8 7.9 6.8  NEUTROABS 5.9  --   --   HGB 10.3* 8.9* 9.2*  HCT 32.0* 28.3* 28.9*  MCV 84.2 84.7 84.5  PLT 316 304 299   Blood Culture    Component Value Date/Time   SDES BLOOD RIGHT  HAND 12/13/2013 1445   SPECREQUEST BOTTLES DRAWN AEROBIC ONLY 5CC 12/13/2013 1445   CULT  Value: NO GROWTH 5 DAYS Performed at [facility] 12/13/2013 1445   REPTSTATUS 12/19/2013 FINAL 12/13/2013 1445    Cardiac Enzymes: No results found for this basename: CKTOTAL, CKMB, CKMBINDEX, TROPONINI,  in the last 168 hours CBG:  Recent Labs Lab 12/21/13 1639 12/21/13 2104 12/22/13 1605 12/22/13 2145 12/23/13 0743  GLUCAP 135* 127* 152* 144* 103*   Iron Studies: No results found for this basename: IRON, TIBC, TRANSFERRIN, FERRITIN,  in the last 72 hours @lablastinr3 @ Studies/Results: No results found. Medications:   . aspirin  325 mg Oral QPM  . atorvastatin  20 mg Oral QHS  . carvedilol  3.125 mg Oral BID WC  . collagenase  1 application Topical Daily  . darbepoetin (ARANESP) injection - DIALYSIS  100 mcg Intravenous Q Mon-HD  . docusate sodium  100 mg Oral BID  . heparin  5,000 Units Subcutaneous 3 times per day  . isosorbide-hydrALAZINE   1 tablet Oral BID  . lanthanum  1,000 mg Oral TID WC  . multivitamin  1 tablet Oral QHS  . PARoxetine  20 mg Oral Daily

## 2013-12-23 NOTE — Progress Notes (Signed)
[facility]  Patient Status: Active (receiving services up to time of hospitalization)  [facility] is providing the following services: RN and MSW  If patient discharges after hours, please call [phone].   Sean [name] 12/23/2013, 9:44 AM

## 2013-12-23 NOTE — Progress Notes (Signed)
CARE MANAGEMENT NOTE 12/23/2013  Patient:  Sean [name], Sean [name]   Account Number:  [id number]  Date Initiated:  12/23/2013  Documentation initiated by:  [facility]  Subjective/Objective Assessment:   Calciphylaxis     Action/Plan:   Anticipated DC Date:  12/24/2013   Anticipated DC Plan:  SKILLED NURSING FACILITY  In-house referral  Clinical Social Worker      DC Planning Services  CM consult      Choice offered to / List presented to:             Status of service:  Completed, signed off Medicare Important Message given?  YES (If response is "NO", the following Medicare IM given date fields will be blank) Date Medicare IM given:  12/23/2013 Medicare IM given by:  [facility] Date Additional Medicare IM given:   Additional Medicare IM given by:    Discharge Disposition:  [place]  Per UR Regulation:    If discussed at Long Length of Stay Meetings, dates discussed:    Comments:

## 2013-12-23 NOTE — Clinical Social Work Placement (Addendum)
Clinical Social Work Department CLINICAL SOCIAL WORK PLACEMENT NOTE 12/23/2013  Patient:  Sean [name], Sean [name]  Account Number:  [id number] Admit date:  12/20/2013  Clinical Social Worker:  Hogan Hoobler Givens, LCSW  Date/time:  12/23/2013 02:02 AM  Clinical Social Work is seeking post-discharge placement for this patient at the following level of care:   SKILLED NURSING   (*CSW will update this form in Epic as items are completed)   12/23/2013  Patient/family provided with [place] Department of Clinical Social Work's list of facilities offering this level of care within the geographic area requested by the patient (or if unable, by the patient's family).  12/23/2013  Patient/family informed of their freedom to choose among providers that offer the needed level of care, that participate in Medicare, Medicaid or managed care program needed by the patient, have an available bed and are willing to accept the patient.    Patient/family informed of [facility]' ownership interest in [facility], as well as of the fact that they are under no obligation to receive care at this facility.  PASARR submitted to EDS on 12/23/2013 PASARR number received on   FL2 transmitted to all facilities in geographic area requested by pt/family on  12/23/2013 FL2 transmitted to all facilities within larger geographic area on   Patient informed that his/her managed care company has contracts with or will negotiate with  certain facilities, including the following:     Patient/family informed of bed offers received: 12/23/13 (Talked with son Jowan Skillin by phone)  Patient chooses bed at [facility] Physician recommends and patient chooses bed at    Patient to be transferred to [facility] on 12/24/13   Patient to be transferred to facility by ambulance Patient and family notified of transfer on 12/24/13 Name of family member notified: Joanathan Affeldt, son [phone]).   The following physician request  were entered in Epic:   Additional Comments: 12/23/13: Patient facility preference is [facility]. 12/23/13: CSW talked with son and advised him that [facility] is reviewing clinicals. CSW requested 2nd choice, however son unable to provide during phone conversation. Phone number [phone]).

## 2013-12-23 NOTE — Progress Notes (Signed)
[facility] PROGRESS NOTE  Sean [name] XBL:[id number] DOB: 1936/03/17 DOA: 12/20/2013 PCP: Philis Fendt, MD Interim summary: 78 year old male with a history of ESRD, coronary artery disease with MI in 2011, hypertension, hyperlipidemia presents with worsening left lower extremity pain since he was discharged from the hospital on 12/16/2013. The patient had a nuclear medicine stress test on 12/14/2013 which did not show any reversible ischemia but showed a EF of 25%. Echocardiogram on 12/15/2013 showed EF 45-50% which was decreased from his previous echocardiogram in April 2013 which showed an EF 65-70%. The patient was started on carvedilol and BiDil. The patient refused cardiac catheterization. He represents today because of uncontrolled pain in his left lower extremity. He was found to have calciphylaxis of the left lower extremity. He was admitted to medical service for further recommendations. He was started on IV morphine and transitioned to po oxycodone. His pain is better controlled. Wound care consulted and recommendations given. Outpatient follow up with wound clinic and wound care with santyl daily. His PTH IS 802, plan to get sestamibi scan tomorrow and depending on the results might need surgery consult for parathyroidectomy vs outpatient follow up.   Assessment/Plan:  Chronic calciphylaxis of the LLE: - WOUND care and pain control.  Wound care consulted and recommendations given. Pain is better controlled.    ESRD on HD: - further management as per renal. Elevated PTH, plan for sestamibi scan tomorrow.   Ischemic cardiomyopathy: EF 45-50%  -Continue BiDil and carvedilol  -Presently no chest pain  -continue ASA  Hyperlipidemia  -Continue Lipitor  Depression  -Continue Paxil  Diabetes mellitus type 2  -Diet controlled  -Hemoglobin A1c 6.3 on 12/14/13  DVT prophylaxis.     Code Status: full code Family Communication: discussed with son.  Disposition Plan:  SNF when bed available.    Consultants:  RENAL  WOUND CARE  Procedures:  HD  Antibiotics:  none  HPI/Subjective: PAIN is better controlled.   Objective: Filed Vitals:   12/23/13 0926  BP: 106/63  Pulse: 86  Temp: 97.9 F (36.6 C)  Resp: 19    Intake/Output Summary (Last 24 hours) at 12/23/13 1621 Last data filed at 12/23/13 1300  Gross per 24 hour  Intake    720 ml  Output      0 ml  Net    720 ml   Filed Weights   12/22/13 0718 12/22/13 1118 12/22/13 2147  Weight: 86.1 kg (189 lb 13.1 oz) 83.9 kg (184 lb 15.5 oz) 86.2 kg (190 lb 0.6 oz)    Exam:   General:  Alert afebrile comfortable  Cardiovascular: s1s2  Respiratory: ctab  Abdomen: soft NT  ND BS+  Musculoskeletal: CHRONIC calciphylaxis of LLE.   Data Reviewed: Basic Metabolic Panel:  Recent Labs Lab 12/20/13 1237 12/21/13 0431 12/22/13 0720  NA 136* 136* 134*  K 3.7 3.8 3.8  CL 91* 90* 89*  CO2 25 24 24   GLUCOSE 153* 113* 121*  BUN 34* 41* 47*  CREATININE 8.78* 9.76* 11.41*  CALCIUM 8.9 8.6 8.3*  PHOS  --  4.2 5.0*   Liver Function Tests:  Recent Labs Lab 12/20/13 1237 12/21/13 0431 12/22/13 0720  AST 20  --   --   ALT 13  --   --   ALKPHOS 94  --   --   BILITOT 0.7  --   --   PROT 8.0  --   --   ALBUMIN 2.7* 2.5* 2.6*   No results found for  this basename: LIPASE, AMYLASE,  in the last 168 hours No results found for this basename: AMMONIA,  in the last 168 hours CBC:  Recent Labs Lab 12/20/13 1237 12/21/13 0431 12/22/13 0720  WBC 7.8 7.9 6.8  NEUTROABS 5.9  --   --   HGB 10.3* 8.9* 9.2*  HCT 32.0* 28.3* 28.9*  MCV 84.2 84.7 84.5  PLT 316 304 299   Cardiac Enzymes: No results found for this basename: CKTOTAL, CKMB, CKMBINDEX, TROPONINI,  in the last 168 hours BNP (last 3 results)  Recent Labs  12/13/13 1408  PROBNP >70000.0*   CBG:  Recent Labs Lab 12/21/13 2104 12/22/13 1605 12/22/13 2145 12/23/13 0743 12/23/13 1202  GLUCAP 127* 152* 144* 103*  133*    No results found for this or any previous visit (from the past 240 hour(s)).   Studies: No results found.  Scheduled Meds: . aspirin  325 mg Oral QPM  . atorvastatin  20 mg Oral QHS  . carvedilol  3.125 mg Oral BID WC  . collagenase  1 application Topical Daily  . darbepoetin (ARANESP) injection - DIALYSIS  100 mcg Intravenous Q Mon-HD  . docusate sodium  100 mg Oral BID  . heparin  5,000 Units Subcutaneous 3 times per day  . isosorbide-hydrALAZINE  1 tablet Oral BID  . lanthanum  1,000 mg Oral TID WC  . multivitamin  1 tablet Oral QHS  . 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

## 2013-12-24 ENCOUNTER — Encounter (HOSPITAL_COMMUNITY): Payer: Medicare Other

## 2013-12-24 ENCOUNTER — Inpatient Hospital Stay (HOSPITAL_COMMUNITY): Payer: Medicare Other

## 2013-12-24 DIAGNOSIS — I10 Essential (primary) hypertension: Secondary | ICD-10-CM

## 2013-12-24 DIAGNOSIS — Z992 Dependence on renal dialysis: Secondary | ICD-10-CM

## 2013-12-24 LAB — CBC
HCT: 27.3 % — ABNORMAL LOW (ref 39.0–52.0)
HEMOGLOBIN: 8.6 g/dL — AB (ref 13.0–17.0)
MCH: 27.2 pg (ref 26.0–34.0)
MCHC: 31.5 g/dL (ref 30.0–36.0)
MCV: 86.4 fL (ref 78.0–100.0)
Platelets: 314 10*3/uL (ref 150–400)
RBC: 3.16 MIL/uL — ABNORMAL LOW (ref 4.22–5.81)
RDW: 14.9 % (ref 11.5–15.5)
WBC: 7.6 10*3/uL (ref 4.0–10.5)

## 2013-12-24 LAB — RENAL FUNCTION PANEL
Albumin: 2.4 g/dL — ABNORMAL LOW (ref 3.5–5.2)
Anion gap: 17 — ABNORMAL HIGH (ref 5–15)
BUN: 34 mg/dL — ABNORMAL HIGH (ref 6–23)
CHLORIDE: 93 meq/L — AB (ref 96–112)
CO2: 25 mEq/L (ref 19–32)
Calcium: 8.4 mg/dL (ref 8.4–10.5)
Creatinine, Ser: 8.72 mg/dL — ABNORMAL HIGH (ref 0.50–1.35)
GFR calc non Af Amer: 5 mL/min — ABNORMAL LOW (ref >90)
GFR, EST AFRICAN AMERICAN: 6 mL/min — AB (ref >90)
Glucose, Bld: 127 mg/dL — ABNORMAL HIGH (ref 70–99)
PHOSPHORUS: 3.8 mg/dL (ref 2.3–4.6)
POTASSIUM: 4.5 meq/L (ref 3.7–5.3)
SODIUM: 135 meq/L — AB (ref 137–147)

## 2013-12-24 LAB — GLUCOSE, CAPILLARY
GLUCOSE-CAPILLARY: 163 mg/dL — AB (ref 70–99)
GLUCOSE-CAPILLARY: 90 mg/dL (ref 70–99)
GLUCOSE-CAPILLARY: 132 mg/dL — AB (ref 70–99)

## 2013-12-24 MED ORDER — HYDROCODONE-ACETAMINOPHEN 5-325 MG PO TABS: 1.0000 | ORAL_TABLET | Freq: Four times a day (QID) | ORAL | Status: DC | PRN

## 2013-12-24 MED ORDER — ACETAMINOPHEN 325 MG PO TABS: 650.0000 mg | ORAL_TABLET | Freq: Four times a day (QID) | ORAL | Status: DC | PRN

## 2013-12-24 MED ORDER — TECHNETIUM TC 99M SESTAMIBI GENERIC - CARDIOLITE
25.0000 | Freq: Once | INTRAVENOUS | Status: AC | PRN
  Administered 2013-12-24: 25 via INTRAVENOUS

## 2013-12-24 MED ORDER — MORPHINE SULFATE 2 MG/ML IJ SOLN
INTRAMUSCULAR | Status: AC
  Filled 2013-12-24: qty 1

## 2013-12-24 NOTE — Progress Notes (Signed)
PT Cancellation Note  Patient Details Name: [name] MRN: [id number] DOB: 12-Apr-1936   Cancelled Treatment:    Reason Eval/Treat Not Completed: Patient at procedure or test/unavailable  Will follow up later today as time allows;  Otherwise, will follow up for PT tomorrow;   Thank you,  Sean [name], [place] Pager [phone] Office [phone]     Sean [name] [facility] 12/24/2013, 3:56 PM

## 2013-12-24 NOTE — Discharge Summary (Addendum)
Physician Discharge Summary  Sean [name] JME:[id number] DOB: 1936/03/07 DOA: 12/20/2013  PCP: Philis Fendt, MD  Admit date: 12/20/2013 Discharge date: 12/24/2013  Time spent: 30 minutes  Recommendations for Outpatient Follow-up:  1. Wound clinic follow up With Dr Lindon Romp 2.  Wound care to chronic ulceration of the LLE:  Cleanse with NS, pat gently dry.  Apply 1/8 inch thick layer of collagenase (Santyl) to the eschar.  Top with saline moistened gauze dressings (opened). Top with ABD, secure with Kerlix. Cover with ACE bandage.  Change daily. 3. Discharging to nursing home for rehab 4. 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   Diet recommendation: renal diet.  Filed Weights   12/22/13 2147 12/23/13 2128 12/24/13 0753  Weight: 86.2 kg (190 lb 0.6 oz) 84.052 kg (185 lb 4.8 oz) 88.3 kg (194 lb 10.7 oz)    History of present illness:  78 year old male with a history of ESRD, coronary artery disease with MI in 2011, hypertension, hyperlipidemia presents with worsening left lower extremity pain - he was discharged from the hospital on 12/16/2013 after being treated for the same leg pain- see below.    Hospital Course:   Chronic calciphylaxis of the LLE:  - WOUND care and pain control.  Wound care consulted and recommendations given. -- I discharged him last week on Vicodin which he was using BID and he stated was controlling his pain very well. On admission, he could not tell the admitting PCP what pain medication he was taking. He states to me today, when I reminded him that it was Vicodin, that it stopped working for him- I will be resuming Vicodin as I feel he was  not taking it appropriately at home- he will be giong to a nursing facility and will need to follow pain control on this.    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   Ischemic cardiomyopathy:   The patient had a nuclear medicine stress test on 12/14/2013 which did not show any reversible ischemia but showed a EF of 25%. Echocardiogram on 12/15/2013 showed EF 45-50% which was decreased from his previous echocardiogram in April 2013 which showed an EF 65-70%. The patient was started on carvedilol and BiDil. The patient refused cardiac catheterization. -Continue BiDil and carvedilol  -continue ASA   Hyperlipidemia  -Continue Lipitor   Depression  -Continue Paxil   Diabetes mellitus type 2  -Diet controlled  -Hemoglobin A1c 6.3 on 12/14/13   Procedures:  none  Consultations:  Renal  Wound care  Discharge Exam: Filed Vitals:   12/24/13 1000 12/24/13 1015 12/24/13 1030 12/24/13 1100  BP: 119/70 85/55 93/57  99/65  Pulse: 86 83 93 85  Temp:      TempSrc:      Resp:      Height:      Weight:      SpO2:        General: alert afebrile comfortable Cardiovascular: s1s2 Respiratory: CTA b/l  Extremities- extensive wounds on left leg  Discharge Instructions You were cared for by a hospitalist during your hospital stay. If you have any questions about your discharge medications or the care you received while you were in the hospital after you are discharged, you can call the unit and asked to speak with the hospitalist on call if the hospitalist that took care of you is not available. Once you are discharged, your primary care physician will handle any further medical issues. Please note that NO REFILLS for any discharge medications will be authorized once you are  discharged, as it is imperative that you return to your primary care physician (or establish a relationship with a primary care physician if you do not have one) for your aftercare needs so that they can reassess your need for medications and monitor your lab values.      Discharge Instructions   Diet - low sodium heart healthy    Complete by:  As directed   Needs a renal and low carb diet     Increase activity slowly    Complete by:  As directed             Medication List    STOP taking these medications       calcium carbonate 500 MG chewable tablet  Commonly known as:  TUMS - dosed in mg elemental calcium     OVER THE COUNTER MEDICATION      TAKE these medications       acetaminophen 325 MG tablet  Commonly known as:  TYLENOL  Take 2 tablets (650 mg total) by mouth every 6 (six) hours as needed for mild pain, fever or headache.     aspirin 325 MG tablet  Take 325 mg by mouth every evening.     atorvastatin 20 MG tablet  Commonly known as:  LIPITOR  Take 20 mg by mouth at bedtime.     b complex-vitamin c-folic acid 0.8 MG Tabs tablet  Take 0.8 mg by mouth at bedtime.     carvedilol 3.125 MG tablet  Commonly known as:  COREG  Take 1 tablet (3.125 mg total) by mouth 2 (two) times daily with a meal.     HYDROcodone-acetaminophen 5-325 MG per tablet  Commonly known as:  NORCO/VICODIN  Take 1 tablet by mouth every 6 (six) hours as needed for moderate pain or severe pain.     hydrOXYzine 10 MG tablet  Commonly known as:  ATARAX/VISTARIL  Take 10 mg by mouth at bedtime as needed. For sleep     isosorbide-hydrALAZINE 20-37.5 MG per tablet  Commonly known as:  BIDIL  Take 1 tablet by mouth 2 (two) times daily.     PARoxetine 20 MG tablet  Commonly known as:  PAXIL  Take 20 mg by mouth daily.       Allergies  Allergen Reactions  . Benazepril Hcl Hives      The results of significant diagnostics from this hospitalization (including imaging, microbiology,  ancillary and laboratory) are listed below for reference.    Significant Diagnostic Studies: Dg Chest 2 View  12/20/2013   CLINICAL DATA:  LEFT lower leg wound infection for 2 weeks, history dialysis, hypertension, diabetes, smoking, stroke  EXAM: CHEST  2 VIEW  COMPARISON:  12/14/2013  FINDINGS: Enlargement of cardiac silhouette.  Tortuous aorta.  Mediastinal contours and pulmonary vascularity normal.  Eventration central LEFT diaphragm with LEFT basilar atelectasis.  No acute infiltrate, pleural effusion or pneumothorax.  No acute  osseous findings.  IMPRESSION: LEFT basilar atelectasis.   Electronically Signed   By: Lavonia Dana M.D.   On: 12/20/2013 14:12   Ct Head Wo Contrast  12/13/2013   CLINICAL DATA:  Headache.  EXAM: CT HEAD WITHOUT CONTRAST  TECHNIQUE: Contiguous axial images were obtained from the base of the skull through the vertex without intravenous contrast.  COMPARISON:  09/19/2011  FINDINGS: Old left parietal infarct with encephalomalacia. Diffuse cerebral atrophy and severe chronic microvascular changes throughout the deep white matter. No acute infarction. No mass lesion. No hemorrhage or hydrocephalus.  Visualized paranasal sinuses and mastoids clear. Orbital soft tissues unremarkable. No acute calvarial abnormality.  IMPRESSION: Old left posterior parietal infarct with encephalomalacia.  Atrophy, chronic small vessel disease.  No acute intracranial abnormality.   Electronically Signed   By: Rolm Baptise M.D.   On: 12/13/2013 21:07   Nm Myocar Multi W/spect W/wall Motion / Ef  12/14/2013   CLINICAL DATA:  78 year old with current history of hypertension and end-stage renal disease on hemodialysis, presenting this admission with chest pain and mildly elevated cardiac enzymes.  EXAM: MYOCARDIAL IMAGING WITH SPECT (REST AND PHARMACOLOGIC-STRESS)  GATED LEFT VENTRICULAR WALL MOTION STUDY  LEFT VENTRICULAR EJECTION FRACTION  TECHNIQUE: Standard myocardial SPECT imaging was performed after  resting intravenous injection of 10 mCi Tc-62m sestamibi. Subsequently, intravenous infusion of Lexiscan was performed under the supervision of the Cardiology staff. At peak effect of the drug, 30 mCi Tc-90m sestamibi was injected intravenously and standard myocardial SPECT imaging was performed. Quantitative gated imaging was also performed to evaluate left ventricular wall motion, and estimate left ventricular ejection fraction.  COMPARISON:  None.  FINDINGS: Immediate post regadenoson images demonstrated diminished activity in the inferior wall, anteroapical wall and apicoseptal wall. Initial resting images demonstrate similar findings. No evidence of reversibility to suggest ischemia. Findings confirmed by the computer generated polar map.  Gated images demonstrate global hypokinesis with essential akinesis involving the inferior wall.  Estimated QGS left ventricular ejection fraction measured 25%, with an end-diastolic volume of 673 ml and an end systolic volume of 419 ml.  IMPRESSION: 1. Prior MI involving the inferior wall and apex. 2. No evidence of myocardial ischemia. 3. Left ventricular enlargement. 4. Global hypokinesis with essential akinesis in the inferior wall. 5. Estimated QGS ejection fraction 25%.   Electronically Signed   By: Evangeline Dakin M.D.   On: 12/14/2013 14:43   Dg Chest Port 1 View  12/14/2013   CLINICAL DATA:  Shortness of breath  EXAM: PORTABLE CHEST - 1 VIEW  COMPARISON:  12/13/2013  FINDINGS: Cardiac shadow is stable. The lungs are well aerated bilaterally. Mild interstitial changes are seen without focal confluent infiltrate. No bony abnormality is noted.  IMPRESSION: No change from the prior exam.   Electronically Signed   By: Inez Catalina M.D.   On: 12/14/2013 08:00   Dg Chest Port 1 View  12/13/2013   CLINICAL DATA:  Left-sided chest pressure  EXAM: PORTABLE CHEST - 1 VIEW  COMPARISON:  02/20/2012  FINDINGS: There is no focal parenchymal opacity, pleural effusion, or  pneumothorax. The heart and mediastinal contours are unremarkable. There is thoracic aortic atherosclerosis.  There is mild cardiomegaly.  The osseous structures are unremarkable.  IMPRESSION: No active disease.   Electronically Signed   By: Kathreen Devoid   On: 12/13/2013 14:33    Microbiology: No results found for this or any previous visit (from the past 240 hour(s)).   Labs: Basic Metabolic Panel:  Recent  Labs Lab 12/20/13 1237 12/21/13 0431 12/22/13 0720 12/24/13 0812  NA 136* 136* 134* 135*  K 3.7 3.8 3.8 4.5  CL 91* 90* 89* 93*  CO2 25 24 24 25   GLUCOSE 153* 113* 121* 127*  BUN 34* 41* 47* 34*  CREATININE 8.78* 9.76* 11.41* 8.72*  CALCIUM 8.9 8.6 8.3* 8.4  PHOS  --  4.2 5.0* 3.8   Liver Function Tests:  Recent Labs Lab 12/20/13 1237 12/21/13 0431 12/22/13 0720 12/24/13 0812  AST 20  --   --   --   ALT 13  --   --   --   ALKPHOS 94  --   --   --   BILITOT 0.7  --   --   --   PROT 8.0  --   --   --   ALBUMIN 2.7* 2.5* 2.6* 2.4*   No results found for this basename: LIPASE, AMYLASE,  in the last 168 hours No results found for this basename: AMMONIA,  in the last 168 hours CBC:  Recent Labs Lab 12/20/13 1237 12/21/13 0431 12/22/13 0720 12/24/13 0812  WBC 7.8 7.9 6.8 7.6  NEUTROABS 5.9  --   --   --   HGB 10.3* 8.9* 9.2* 8.6*  HCT 32.0* 28.3* 28.9* 27.3*  MCV 84.2 84.7 84.5 86.4  PLT 316 304 299 314   Cardiac Enzymes: No results found for this basename: CKTOTAL, CKMB, CKMBINDEX, TROPONINI,  in the last 168 hours BNP: BNP (last 3 results)  Recent Labs  12/13/13 1408  PROBNP >70000.0*   CBG:  Recent Labs Lab 12/22/13 2145 12/23/13 0743 12/23/13 1202 12/23/13 1658 12/23/13 2127  GLUCAP 144* 103* 133* 130* 163*       SignedDebbe Odea, MD [facility] 12/24/2013, 11:26 AM

## 2013-12-24 NOTE — Procedures (Signed)
I was present at this dialysis session, have reviewed the session itself and made  appropriate changes  [name] MD (pgr) [phone]    ([phone] 12/24/2013, 12:37 PM

## 2013-12-24 NOTE — Progress Notes (Signed)
Subjective:   Feeling well. Still with L leg pain but otherwise no complaints  Objective Filed Vitals:   12/24/13 0830 12/24/13 0835 12/24/13 0900 12/24/13 0930  BP: 74/54 96/56 93/56  107/62  Pulse: 81 80 82 86  Temp:      TempSrc:      Resp:      Height:      Weight:      SpO2:       Physical Exam General:  Alert and oriented. No acute distress Heart: RRR, no murmur Lungs: CTA, unlabored Abdomen: soft, nontender. +BS Extremities: no edema. L leg ace wrapped. Dialysis Access: L AVG patent on HD  Physical Exam  General: alert and oriented. No acute distress  Heart: RRR  Lungs: CTA, unlabored  Abdomen: soft, nontender +BS  Extremities: L leg ACE wrapped. No edema  Dialysis Access: L AVG +bruit  HD: [facility]  4 hrs 88kgs 2K/2.25Ca 450/AF1.5 L AVG 9800heparin  Aranesp 25 q 4 weeks (given 7/8)   Assessment/Plan:  1. Left leg wounds - calciphylaxis, pain controlled on IV Morphine, wound care; iPTH 802, P 3.8. Wound care eval- continue Santyl 2. ESRD - HD on MWF @ [place], missed last Tx 7/10, K 4.5. HD today, tolerating well 3. Hypertension/volume - BP 107/62 on Bidil 20/37.5 mg bid & Carvedilol 3.125 mg bid since last admission; below EDW, CXR negative.  4. Anemia - Hgb 8.6, outpatient Aranesp 25 mcg q4wks. Aranesp 100 mcg started 7/13/ Watch CBC 5. Metabolic bone disease - Ca 8.4(9.7 corrected), P 3.8 on Fosrenol @ home. Change binder from CaCO3 to Fosrenol.  S/p parathryroidectomy 2010, 4th gland not found and PTH high, consult general surgery 6. Nutrition - Alb 2.4, renal diet, multivitamin. DM Type 2 - diet controlled, last Hgb A1C 6.3.  7. Hx CAD - evaluated for chest pain 7/4-7, LBBB per EKG, negative stress test, but decreasing EF, refused cardiac cath.  8. Hx CVA - most recently in 09/2011, also in 2000.   Shelle Iron, NP [facility] Beeper [phone] 12/24/2013,10:02 AM  LOS: 4 days   Pt seen, examined and agree w A/P as above. Have called surgery to  eval for possible removal of 4th parathyroid gland.  Pt has severe calciphylaxis and high PTH with hx of PTX in 2010 during which only 3 glands were found. Records are in [facility] and in the shadow chart from that surgery.  [name] MD pager [phone]    cell [phone] 12/24/2013, 12:29 PM    Additional Objective Labs: Basic Metabolic Panel:  Recent Labs Lab 12/21/13 0431 12/22/13 0720 12/24/13 0812  NA 136* 134* 135*  K 3.8 3.8 4.5  CL 90* 89* 93*  CO2 24 24 25   GLUCOSE 113* 121* 127*  BUN 41* 47* 34*  CREATININE 9.76* 11.41* 8.72*  CALCIUM 8.6 8.3* 8.4  PHOS 4.2 5.0* 3.8   Liver Function Tests:  Recent Labs Lab 12/20/13 1237 12/21/13 0431 12/22/13 0720 12/24/13 0812  AST 20  --   --   --   ALT 13  --   --   --   ALKPHOS 94  --   --   --   BILITOT 0.7  --   --   --   PROT 8.0  --   --   --   ALBUMIN 2.7* 2.5* 2.6* 2.4*   No results found for this basename: LIPASE, AMYLASE,  in the last 168 hours CBC:  Recent Labs Lab 12/20/13 1237 12/21/13 0431  12/22/13 0720 12/24/13 0812  WBC 7.8 7.9 6.8 7.6  NEUTROABS 5.9  --   --   --   HGB 10.3* 8.9* 9.2* 8.6*  HCT 32.0* 28.3* 28.9* 27.3*  MCV 84.2 84.7 84.5 86.4  PLT 316 304 299 314   Blood Culture    Component Value Date/Time   SDES BLOOD RIGHT HAND 12/13/2013 1445   SPECREQUEST BOTTLES DRAWN AEROBIC ONLY 5CC 12/13/2013 1445   CULT  Value: NO GROWTH 5 DAYS Performed at [facility] 12/13/2013 1445   REPTSTATUS 12/19/2013 FINAL 12/13/2013 1445    Cardiac Enzymes: No results found for this basename: CKTOTAL, CKMB, CKMBINDEX, TROPONINI,  in the last 168 hours CBG:  Recent Labs Lab 12/22/13 2145 12/23/13 0743 12/23/13 1202 12/23/13 1658 12/23/13 2127  GLUCAP 144* 103* 133* 130* 163*   Iron Studies: No results found for this basename: IRON, TIBC, TRANSFERRIN, FERRITIN,  in the last 72 hours @lablastinr3 @ Studies/Results: No results found. Medications:   . aspirin  325 mg Oral QPM  . atorvastatin   20 mg Oral QHS  . carvedilol  3.125 mg Oral BID WC  . collagenase  1 application Topical Daily  . darbepoetin (ARANESP) injection - DIALYSIS  100 mcg Intravenous Q Mon-HD  . docusate sodium  100 mg Oral BID  . heparin  5,000 Units Subcutaneous 3 times per day  . isosorbide-hydrALAZINE  1 tablet Oral BID  . lanthanum  1,000 mg Oral TID WC  . multivitamin  1 tablet Oral QHS  . PARoxetine  20 mg Oral Daily

## 2013-12-24 NOTE — Progress Notes (Signed)
PT Cancellation Note  Patient Details Name: Sean [name] MRN: [id number] DOB: March 07, 1936   Cancelled Treatment:    Reason Eval/Treat Not Completed: Patient at procedure or test/unavailable Currently in HD;   Will follow up later today as time allows;  Otherwise, will follow up for PT tomorrow;   Thank you,  Roney Marion, [place] Pager [phone] Office [phone]     Roney Marion [facility] 12/24/2013, 8:44 AM

## 2013-12-24 NOTE — Progress Notes (Addendum)
[facility] PROGRESS NOTE  Sean [name] NOB:[id number] DOB: 01/31/1936 DOA: 12/20/2013 PCP: Philis Fendt, MD  Assessment/Plan:  Chronic calciphylaxis of the LLE with uncontrolled pain - WOUND care and pain control.  -  I discharged him last week on Vicodin which he was using BID and stated was controlling his pain very well. On admission, he could not tell the admitting PCP what pain medication he was taking. He states to me today when I reminded him that it was Vicodin that it stopped working for him- I will be resuming Vicodin as I feel he was not taking it appropriately at home- he will be giong to a nursing facility and will need to follow pain control on this.    ESRD on HD: - further management as per renal.   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- had a parathyroidectomy about 4-5 yrs ago but 4th parathyroid gland could not be found- Renal team trying to contact surgery to see what can be done  Ischemic cardiomyopathy: EF 45-50%  -Continue BiDil and carvedilol  -Presently no chest pain  -continue ASA  Hyperlipidemia  -Continue Lipitor   Depression  -Continue Paxil  Diabetes mellitus type 2   -Diet controlled  -Hemoglobin A1c 6.3 on 12/14/13  Code Status: full code Family Communication: none at bedside Disposition Plan: PT recommends SNF - awaiting PASSAR   Consultants:  RENAL  WOUND CARE  Procedures:  HD  Antibiotics:  none  HPI/Subjective: PAIN is better controlled.   Objective: Filed Vitals:   12/24/13 1030  BP: 93/57  Pulse: 93  Temp:   Resp:     Intake/Output Summary (Last 24 hours) at 12/24/13 1105 Last data filed at 12/23/13 2000  Gross per 24 hour  Intake    480 ml  Output      0 ml  Net    480 ml   Filed Weights   12/22/13 2147 12/23/13 2128 12/24/13 0753  Weight: 86.2 kg (190 lb 0.6 oz) 84.052 kg (185 lb 4.8 oz) 88.3 kg (194 lb 10.7 oz)    Exam:   General:  Alert afebrile  comfortable  Cardiovascular: s1s2  Respiratory: ctab  Abdomen: soft NT  ND BS+  Musculoskeletal: CHRONIC calciphylaxis of LLE.   Data Reviewed: Basic Metabolic Panel:  Recent Labs Lab 12/20/13 1237 12/21/13 0431 12/22/13 0720 12/24/13 0812  NA 136* 136* 134* 135*  K 3.7 3.8 3.8 4.5  CL 91* 90* 89* 93*  CO2 25 24 24 25   GLUCOSE 153* 113* 121* 127*  BUN 34* 41* 47* 34*  CREATININE 8.78* 9.76* 11.41* 8.72*  CALCIUM 8.9 8.6 8.3* 8.4  PHOS  --  4.2 5.0* 3.8   Liver Function Tests:  Recent Labs Lab 12/20/13 1237 12/21/13 0431 12/22/13 0720 12/24/13 0812  AST 20  --   --   --   ALT 13  --   --   --   ALKPHOS 94  --   --   --   BILITOT 0.7  --   --   --   PROT 8.0  --   --   --   ALBUMIN 2.7* 2.5* 2.6* 2.4*   No results found for this basename: LIPASE, AMYLASE,  in the last 168 hours No results found for this basename: AMMONIA,  in the last 168 hours CBC:  Recent Labs Lab 12/20/13 1237 12/21/13 0431 12/22/13 0720 12/24/13 0812  WBC 7.8 7.9 6.8 7.6  NEUTROABS 5.9  --   --   --  HGB 10.3* 8.9* 9.2* 8.6*  HCT 32.0* 28.3* 28.9* 27.3*  MCV 84.2 84.7 84.5 86.4  PLT 316 304 299 314   Cardiac Enzymes: No results found for this basename: CKTOTAL, CKMB, CKMBINDEX, TROPONINI,  in the last 168 hours BNP (last 3 results)  Recent Labs  12/13/13 1408  PROBNP >70000.0*   CBG:  Recent Labs Lab 12/22/13 2145 12/23/13 0743 12/23/13 1202 12/23/13 1658 12/23/13 2127  GLUCAP 144* 103* 133* 130* 163*    No results found for this or any previous visit (from the past 240 hour(s)).   Studies: No results found.  Scheduled Meds: . aspirin  325 mg Oral QPM  . atorvastatin  20 mg Oral QHS  . carvedilol  3.125 mg Oral BID WC  . collagenase  1 application Topical Daily  . darbepoetin (ARANESP) injection - DIALYSIS  100 mcg Intravenous Q Mon-HD  . docusate sodium  100 mg Oral BID  . heparin  5,000 Units Subcutaneous 3 times per day  . isosorbide-hydrALAZINE  1  tablet Oral BID  . lanthanum  1,000 mg Oral TID WC  . multivitamin  1 tablet Oral QHS  . PARoxetine  20 mg Oral Daily   Continuous Infusions:      Time spent: 25 minutes    [facility]  [facility] Pager [phone] If 7PM-7AM, please contact night-coverage at [email or web address], password [facility] 12/24/2013, 11:05 AM  LOS: 4 days

## 2013-12-24 NOTE — Progress Notes (Signed)
Patient Discharge:  Disposition: D/c to SNF  Education: Notified pt of discharge  IV: D/C  Follow-up appointments: In d/c packet  Prescriptions: Sent to pharmacy  Transportation: Ambulance  Belongings:clothing, cell phone

## 2013-12-25 ENCOUNTER — Other Ambulatory Visit: Payer: Self-pay | Admitting: *Deleted

## 2013-12-25 MED ORDER — HYDROCODONE-ACETAMINOPHEN 5-325 MG PO TABS: 1.0000 | ORAL_TABLET | Freq: Four times a day (QID) | ORAL | Status: DC | PRN

## 2013-12-25 NOTE — Telephone Encounter (Signed)
[facility] 

## 2013-12-26 ENCOUNTER — Non-Acute Institutional Stay (SKILLED_NURSING_FACILITY): Payer: Medicare Other | Admitting: Adult Health

## 2013-12-26 DIAGNOSIS — I150 Renovascular hypertension: Secondary | ICD-10-CM

## 2013-12-26 DIAGNOSIS — M79605 Pain in left leg: Secondary | ICD-10-CM

## 2013-12-26 DIAGNOSIS — F411 Generalized anxiety disorder: Secondary | ICD-10-CM

## 2013-12-26 DIAGNOSIS — M79609 Pain in unspecified limb: Secondary | ICD-10-CM

## 2013-12-26 DIAGNOSIS — E785 Hyperlipidemia, unspecified: Secondary | ICD-10-CM

## 2013-12-26 DIAGNOSIS — N186 End stage renal disease: Secondary | ICD-10-CM

## 2013-12-26 DIAGNOSIS — L97909 Non-pressure chronic ulcer of unspecified part of unspecified lower leg with unspecified severity: Secondary | ICD-10-CM

## 2013-12-26 DIAGNOSIS — L97921 Non-pressure chronic ulcer of unspecified part of left lower leg limited to breakdown of skin: Principal | ICD-10-CM

## 2013-12-26 DIAGNOSIS — Z992 Dependence on renal dialysis: Secondary | ICD-10-CM

## 2013-12-28 ENCOUNTER — Encounter: Payer: Self-pay | Admitting: Adult Health

## 2013-12-28 DIAGNOSIS — Z992 Dependence on renal dialysis: Secondary | ICD-10-CM | POA: Insufficient documentation

## 2013-12-28 DIAGNOSIS — I150 Renovascular hypertension: Secondary | ICD-10-CM | POA: Insufficient documentation

## 2013-12-28 NOTE — Progress Notes (Signed)
Patient ID: Sean [name], male   DOB: 09-Sep-1935, 78 y.o.   MRN: [id number]     [facility]  Allergies  Allergen Reactions  . Benazepril Hcl Hives     Chief Complaint  Patient presents with  . Hospitalization Follow-up    HPI:  He has been hospitalized due to his left leg pain; cellulitis from his nonhealing leg wound from his calciphylaxis. He has been admitted to this facility for short term rehab with his goal to return back home. At this time his pain is not being managed with his dressing changes. He will need follow up with wound care as well.    Past Medical History  Diagnosis Date  . Hyperparathyroidism   . Arthritis   . Hyperlipidemia   . Hypertension   . Neuromuscular disorder   . Stroke   . GERD (gastroesophageal reflux disease)   . Headache(784.0)     occasional  migraine   . Heart attack 2011; 1990's  . Type II diabetes mellitus   . Peripheral neuropathy   . Anxiety   . Depression   . Dialysis patient     M, W, F; [place] (09/19/11)  . Chronic kidney disease     Hemo MWF    Past Surgical History  Procedure Laterality Date  . Colon surgery  2011    colon resection   . Parathyroidectomy  2011  . Capd insertion  09/12/2011    Procedure: LAPAROSCOPIC INSERTION CONTINUOUS AMBULATORY PERITONEAL DIALYSIS  (CAPD) CATHETER;  Surgeon: Edward Jolly, MD;  Location: [place];  Service: General;  Laterality: N/A;  . Sp av dialysis shunt access existing *l*  2003  . Dg av dialysis shunt intro ndl*r* or      "not working" (09/19/11)  . Capd removal  03/12/2012    Procedure: CONTINUOUS AMBULATORY PERITONEAL DIALYSIS  (CAPD) CATHETER REMOVAL;  Surgeon: Edward Jolly, MD;  Location: [facility];  Service: General;  Laterality: N/A;    VITAL SIGNS BP 131/70  Pulse 93  Ht 5\' 5"  (1.651 m)  Wt 188 lb 6.4 oz (85.458 kg)  BMI 31.35 kg/m2   Patient's Medications  New Prescriptions   No medications on file  Previous Medications   ACETAMINOPHEN  (TYLENOL) 325 MG TABLET    Take 2 tablets (650 mg total) by mouth every 6 (six) hours as needed for mild pain, fever or headache.   ASPIRIN 325 MG TABLET    Take 325 mg by mouth every evening.   ATORVASTATIN (LIPITOR) 20 MG TABLET    Take 20 mg by mouth at bedtime.   B COMPLEX-VITAMIN C-FOLIC ACID (NEPHRO-VITE) 0.8 MG TABS    Take 0.8 mg by mouth at bedtime.   CARVEDILOL (COREG) 3.125 MG TABLET    Take 1 tablet (3.125 mg total) by mouth 2 (two) times daily with a meal.   HYDROCODONE-ACETAMINOPHEN (NORCO/VICODIN) 5-325 MG PER TABLET    Take 1 tablet by mouth every 6 (six) hours as needed for moderate pain or severe pain.   HYDROXYZINE (ATARAX/VISTARIL) 10 MG TABLET    Take 10 mg by mouth at bedtime as needed. For sleep   ISOSORBIDE-HYDRALAZINE (BIDIL) 20-37.5 MG PER TABLET    Take 1 tablet by mouth 2 (two) times daily.   PAROXETINE (PAXIL) 20 MG TABLET    Take 20 mg by mouth daily.  Modified Medications   No medications on file  Discontinued Medications   No medications on file    SIGNIFICANT DIAGNOSTIC  EXAMS  12-13-13: chest x-ray: No active disease.  12-13-13: ct of head: Old left posterior parietal infarct with encephalomalacia. Atrophy, chronic small vessel disease. No acute intracranial abnormality.  12-14-13: myocardial perfusion: 1. Prior MI involving the inferior wall and apex. 2. No evidence of myocardial ischemia. 3. Left ventricular enlargement.4. Global hypokinesis with essential akinesis in the inferior wall. 5. Estimated QGS ejection fraction 25%.   12-15-13: TEE: Left ventricle: Poorly visualized consider repeat with contrast Abnormal septal motion. The cavity size was mildly dilated. Wall thickness was normal. Systolic function was mildly reduced. The estimated ejection fraction was in the range of 45% to 50%. - Mitral valve: Calcified annulus. - Left atrium: The atrium was mildly dilated. - Atrial septum: No defect or patent foramen ovale was identified.  12-20-13: chest x-ray:  LEFT basilar atelectasis.    LABS REVIEWED:   12-20-13: wbc 7.8; hgb 10.3; hct 32.0; mcv 84.2; plt 316; glucose 153; bun 34; creat 8.78; k+3.7; na++136; liver normal albumin 2.7 12-21-13: tsh 1.050; vit b12: 1157 12-24-13; wbc 7.6;hgb 8.6; hct 27.3; mcv 86.4; plt 314; glucose 127; bun 34; creat 8.72; k+4.5; na++135; phos 3.8; albumin 2.4   Review of Systems  Constitutional: Negative for malaise/fatigue.  Respiratory: Negative for cough and shortness of breath.   Cardiovascular: Positive for leg swelling. Negative for chest pain and palpitations.  Gastrointestinal: Negative for heartburn, abdominal pain and constipation.  Musculoskeletal:       Has left lower extremity pain   Skin:       Has painful left lower leg sores  Psychiatric/Behavioral: Negative for depression. The patient is not nervous/anxious.     Physical Exam  Constitutional: No distress.  obese  Neck: Neck supple. No JVD present.  Cardiovascular: Normal rate, regular rhythm and intact distal pulses.   Respiratory: Effort normal and breath sounds normal. No respiratory distress. He has no wheezes.  GI: Soft. Bowel sounds are normal. He exhibits no distension. There is no tenderness.  Musculoskeletal: He exhibits edema.  Has 2+ lower extremity edema Is able to move all extremities   Neurological: He is alert.  Skin: Skin is warm and dry. He is not diaphoretic.  Left lower arm a/v fistula +thrill+bruit Left lower extremity 18 x 10 x 0 cm with purulent drainage and foul odor 95% eschar present. Is treated with santyl and ace wrap daily.      ASSESSMENT/ PLAN:  1. 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 endstage renal disease on hemodialysis: will continue 1200 cc fluid restriction; hemodialysis three days per week; is  followed by nephrology and will monitor his status   3. Hypertension: is stable will continue coreg 3.125 mg twice daily and bidil twice daily will monitor   4. Ischemic cardiomyopathy: is presently stable will continue bidil twice daily and will monitor his status   5. Dyslipidemia: will continue lipitor 20 mg daily   6. Anxiety and depression: will continue paxil 20 mg daily   7. Left leg pain due to calciphylaxis: will change his vicodin to 5/325 mg 2 tabs prior to dressing change and 1 or 2 tabs every 6 hours as needed and will monitor his status.     Time spent with patient 50 minutes     Ok Edwards NP [facility]  Contact [phone] Monday  through Friday 8am- 5pm  After hours call [phone]

## 2013-12-30 DIAGNOSIS — L97409 Non-pressure chronic ulcer of unspecified heel and midfoot with unspecified severity: Secondary | ICD-10-CM | POA: Diagnosis not present

## 2013-12-30 DIAGNOSIS — L988 Other specified disorders of the skin and subcutaneous tissue: Secondary | ICD-10-CM | POA: Diagnosis not present

## 2013-12-30 DIAGNOSIS — M79609 Pain in unspecified limb: Secondary | ICD-10-CM | POA: Diagnosis not present

## 2013-12-30 DIAGNOSIS — E1159 Type 2 diabetes mellitus with other circulatory complications: Secondary | ICD-10-CM | POA: Diagnosis not present

## 2013-12-30 DIAGNOSIS — I798 Other disorders of arteries, arterioles and capillaries in diseases classified elsewhere: Secondary | ICD-10-CM | POA: Diagnosis not present

## 2014-01-01 ENCOUNTER — Non-Acute Institutional Stay (SKILLED_NURSING_FACILITY): Payer: Medicare Other | Admitting: Internal Medicine

## 2014-01-01 DIAGNOSIS — M79609 Pain in unspecified limb: Secondary | ICD-10-CM

## 2014-01-01 DIAGNOSIS — I150 Renovascular hypertension: Secondary | ICD-10-CM

## 2014-01-01 DIAGNOSIS — F329 Major depressive disorder, single episode, unspecified: Secondary | ICD-10-CM

## 2014-01-01 DIAGNOSIS — F3289 Other specified depressive episodes: Secondary | ICD-10-CM

## 2014-01-01 DIAGNOSIS — R5383 Other fatigue: Secondary | ICD-10-CM

## 2014-01-01 DIAGNOSIS — F32A Depression, unspecified: Secondary | ICD-10-CM | POA: Insufficient documentation

## 2014-01-01 DIAGNOSIS — R531 Weakness: Secondary | ICD-10-CM | POA: Insufficient documentation

## 2014-01-01 DIAGNOSIS — L97909 Non-pressure chronic ulcer of unspecified part of unspecified lower leg with unspecified severity: Secondary | ICD-10-CM

## 2014-01-01 DIAGNOSIS — M79605 Pain in left leg: Secondary | ICD-10-CM

## 2014-01-01 DIAGNOSIS — R5381 Other malaise: Secondary | ICD-10-CM

## 2014-01-01 DIAGNOSIS — L97921 Non-pressure chronic ulcer of unspecified part of left lower leg limited to breakdown of skin: Principal | ICD-10-CM

## 2014-01-01 DIAGNOSIS — E785 Hyperlipidemia, unspecified: Secondary | ICD-10-CM

## 2014-01-01 DIAGNOSIS — N186 End stage renal disease: Secondary | ICD-10-CM

## 2014-01-01 NOTE — Progress Notes (Signed)
Patient ID: APOLO CUTSHAW, male   DOB: [birth year], 78 y.o.   MRN: [id number]  Facility: [facility]       PCP: Philis Fendt, MD  Code Status: full code  Allergies  Allergen Reactions  . Benazepril Hcl Hives    Chief Complaint: new admit  HPI:  78 y/o male patient is here for STR after hospital admission from 12/20/13-12/24/13 with lower extremity pain. He has chronic calciphylaxis of left lower extremity. Pain medication was provided and wound consult was obtained.today his pain is under control. He also had cardiac workup, refused cath and was put on bidil and carvedilol wit aspirin He has history of CAD, ESRD on HD, HTN, HL He is seen in his room, in no distress and denies any complaint Seen by wound care on 12/30/13 for extreme calciphylaxis in left leg. He is followed by wound care team here  Review of Systems:  Constitutional: Negative for fever, chills, malaise/fatigue and diaphoresis.  HENT: Negative for congestion, hearing loss and sore throat.   Eyes: Negative for eye pain, blurred vision, double vision and discharge.  Respiratory: Negative for cough, sputum production, shortness of breath and wheezing.   Cardiovascular: Negative for chest pain, palpitations  Gastrointestinal: Negative for heartburn, nausea, vomiting, abdominal pain, diarrhea and constipation.  Genitourinary: does not make urine Musculoskeletal: Negative for back pain, falls. Has left leg pain  Skin: Negative for itching and rash. sores in the left leg Neurological: Negative for dizziness Psychiatric/Behavioral: Negative for depression   Past Medical History  Diagnosis Date  . Hyperparathyroidism   . Arthritis   . Hyperlipidemia   . Hypertension   . Neuromuscular disorder   . Stroke   . GERD (gastroesophageal reflux disease)   . Headache(784.0)     occasional  migraine   . Heart attack 2011; 1990's  . Type II diabetes mellitus   . Peripheral neuropathy   .  Anxiety   . Depression   . Dialysis patient     M, W, F; [place] (09/19/11)  . Chronic kidney disease     Hemo MWF   Past Surgical History  Procedure Laterality Date  . Colon surgery  2011    colon resection   . Parathyroidectomy  2011  . Capd insertion  09/12/2011    Procedure: LAPAROSCOPIC INSERTION CONTINUOUS AMBULATORY PERITONEAL DIALYSIS  (CAPD) CATHETER;  Surgeon: Edward Jolly, MD;  Location: [place];  Service: General;  Laterality: N/A;  . Sp av dialysis shunt access existing *l*  2003  . Dg av dialysis shunt intro ndl*r* or      "not working" (09/19/11)  . Capd removal  03/12/2012    Procedure: CONTINUOUS AMBULATORY PERITONEAL DIALYSIS  (CAPD) CATHETER REMOVAL;  Surgeon: Edward Jolly, MD;  Location: [place];  Service: General;  Laterality: N/A;   Social History:   reports that he quit smoking about 37 years ago. His smoking use included Cigarettes. He has a 1.75 pack-year smoking history. He has never used smokeless tobacco. He reports that he drinks alcohol. He reports that he does not use illicit drugs.  Family History  Problem Relation Age of Onset  . Cancer Sister     breast  . Cancer Brother     lung  . Anesthesia problems Neg Hx   . Hypotension Neg Hx   . Malignant hyperthermia Neg Hx   . Pseudochol deficiency Neg Hx     Medications: Patient's Medications  New Prescriptions  No medications on file  Previous Medications   ACETAMINOPHEN (TYLENOL) 325 MG TABLET    Take 2 tablets (650 mg total) by mouth every 6 (six) hours as needed for mild pain, fever or headache.   ASPIRIN 325 MG TABLET    Take 325 mg by mouth every evening.   ATORVASTATIN (LIPITOR) 20 MG TABLET    Take 20 mg by mouth at bedtime.   B COMPLEX-VITAMIN C-FOLIC ACID (NEPHRO-VITE) 0.8 MG TABS    Take 0.8 mg by mouth at bedtime.   CARVEDILOL (COREG) 3.125 MG TABLET    Take 1 tablet (3.125 mg total) by mouth 2 (two) times daily with a meal.   HYDROCODONE-ACETAMINOPHEN (NORCO/VICODIN)  5-325 MG PER TABLET    Take 1 tablet by mouth every 6 (six) hours as needed for moderate pain or severe pain.   HYDROXYZINE (ATARAX/VISTARIL) 10 MG TABLET    Take 10 mg by mouth at bedtime as needed. For sleep   ISOSORBIDE-HYDRALAZINE (BIDIL) 20-37.5 MG PER TABLET    Take 1 tablet by mouth 2 (two) times daily.   PAROXETINE (PAXIL) 20 MG TABLET    Take 20 mg by mouth daily.  Modified Medications   No medications on file  Discontinued Medications   No medications on file     Physical Exam: Filed Vitals:   01/01/14 1032  BP: 129/69  Pulse: 95  Temp: 99.6 F (37.6 C)  Resp: 18  SpO2: 98%   General- elderly male in no acute distress Head- atraumatic, normocephalic Eyes- no pallor, no icterus, no discharge Neck- no lymphadenopathy Cardiovascular- normal s1,s2 Respiratory- bilateral clear to auscultation, no wheeze, no rhonchi, no crackles, no use of accessory muscles Abdomen- bowel sounds present, soft, non tender Musculoskeletal- able to move all 4 extremities, on wheelchair, has left leg edema Neurological- no focal deficit Skin- warm and dry, left arm fistula, left leg wound with ace wrap in place Psychiatry- alert and oriented   Labs reviewed: Basic Metabolic Panel:  Recent Labs  12/21/13 0431 12/22/13 0720 12/24/13 0812  NA 136* 134* 135*  K 3.8 3.8 4.5  CL 90* 89* 93*  CO2 24 24 25   GLUCOSE 113* 121* 127*  BUN 41* 47* 34*  CREATININE 9.76* 11.41* 8.72*  CALCIUM 8.6 8.3* 8.4  PHOS 4.2 5.0* 3.8   Liver Function Tests:  Recent Labs  12/13/13 1407 12/20/13 1237 12/21/13 0431 12/22/13 0720 12/24/13 0812  AST 17 20  --   --   --   ALT 8 13  --   --   --   ALKPHOS 93 94  --   --   --   BILITOT 0.9 0.7  --   --   --   PROT 8.1 8.0  --   --   --   ALBUMIN 2.7* 2.7* 2.5* 2.6* 2.4*   No results found for this basename: LIPASE, AMYLASE,  in the last 8760 hours No results found for this basename: AMMONIA,  in the last 8760 hours CBC:  Recent Labs   12/13/13 1407  12/20/13 1237 12/21/13 0431 12/22/13 0720 12/24/13 0812  WBC 8.5  < > 7.8 7.9 6.8 7.6  NEUTROABS 6.5  --  5.9  --   --   --   HGB 9.6*  < > 10.3* 8.9* 9.2* 8.6*  HCT 29.8*  < > 32.0* 28.3* 28.9* 27.3*  MCV 84.7  < > 84.2 84.7 84.5 86.4  PLT 312  < > 316 304 299 314  < > =  values in this interval not displayed. Cardiac Enzymes:  Recent Labs  12/13/13 1952 12/14/13 0021 12/14/13 0530  TROPONINI 0.55* 0.50* 0.53*   BNP: No components found with this basename: POCBNP,  CBG:  Recent Labs  12/23/13 2127 12/24/13 1249 12/24/13 1616  GLUCAP 163* 90 132*    Radiological Exams: 12-13-13: chest x-ray: No active disease.  12-13-13: ct of head: Old left posterior parietal infarct with encephalomalacia. Atrophy, chronic small vessel disease. No acute intracranial abnormality.  12-14-13: myocardial perfusion: 1. Prior MI involving the inferior wall and apex. 2. No evidence of myocardial ischemia. 3. Left ventricular enlargement.4. Global hypokinesis with essential akinesis in the inferior wall. 5. Estimated QGS ejection fraction 25%.   12-15-13: TEE: Left ventricle: Poorly visualized consider repeat with contrast Abnormal septal motion. The cavity size was mildly dilated. Wall thickness was normal. Systolic function was mildly reduced. The estimated ejection fraction was in the range of 45% to 50%. - Mitral valve: Calcified annulus. - Left atrium: The atrium was mildly dilated. - Atrial septum: No defect or patent foramen ovale was identified.  12-20-13: chest x-ray: LEFT basilar atelectasis.   Assessment/Plan  ESRD on hemodialysis. continue 1200 cc fluid restriction. Followed by renal  Leg pain From severe calciphylaxis. Continue vicodin before dressing and prn current regimen  Chronic Calciphylaxis of left lower extremity with nonhealing ulcer. will continue his current treatment of santyl and ace wrap daily. Follow wound clinic recs. Continue dakin's solution to  cleanse wound in lower leg area. Continue silver alignate    Generalized weakness Will have patient work with PT/OT as tolerated to regain strength and restore function.  Fall precautions are in place.  HTN Continue isosorbide dinitrate and hydralazine  Ischemic cardiomyopathy Continue carvedilol and bidil for now  Dyslipidemia continue lipitor 20 mg daily   Anxiety and depression continue paxil 20 mg daily   Family/ staff Communication: reviewed care plan with patient and nursing supervisor   Goals of care: short term rehabilitation   Labs/tests ordered- none    [name], MD  [place] (Monday-Friday 8 am - 5 pm) [phone] (afterhours)

## 2014-01-06 ENCOUNTER — Ambulatory Visit (INDEPENDENT_AMBULATORY_CARE_PROVIDER_SITE_OTHER): Payer: Medicare Other | Admitting: Surgery

## 2014-01-06 ENCOUNTER — Telehealth (INDEPENDENT_AMBULATORY_CARE_PROVIDER_SITE_OTHER): Payer: Self-pay

## 2014-01-06 DIAGNOSIS — L988 Other specified disorders of the skin and subcutaneous tissue: Secondary | ICD-10-CM | POA: Diagnosis not present

## 2014-01-06 DIAGNOSIS — L97409 Non-pressure chronic ulcer of unspecified heel and midfoot with unspecified severity: Secondary | ICD-10-CM | POA: Diagnosis not present

## 2014-01-06 DIAGNOSIS — M79609 Pain in unspecified limb: Secondary | ICD-10-CM | POA: Diagnosis not present

## 2014-01-06 DIAGNOSIS — E1159 Type 2 diabetes mellitus with other circulatory complications: Secondary | ICD-10-CM | POA: Diagnosis not present

## 2014-01-06 LAB — GLUCOSE, CAPILLARY: Glucose-Capillary: 191 mg/dL — ABNORMAL HIGH (ref 70–99)

## 2014-01-06 NOTE — Telephone Encounter (Signed)
LMOM re: n/s appt.

## 2014-01-10 DIAGNOSIS — K8020 Calculus of gallbladder without cholecystitis without obstruction: Secondary | ICD-10-CM

## 2014-01-10 HISTORY — DX: Calculus of gallbladder without cholecystitis without obstruction: K80.20

## 2014-01-13 ENCOUNTER — Encounter (HOSPITAL_BASED_OUTPATIENT_CLINIC_OR_DEPARTMENT_OTHER): Payer: Medicare Other | Attending: General Surgery

## 2014-01-13 DIAGNOSIS — L988 Other specified disorders of the skin and subcutaneous tissue: Secondary | ICD-10-CM | POA: Diagnosis present

## 2014-01-13 DIAGNOSIS — L97809 Non-pressure chronic ulcer of other part of unspecified lower leg with unspecified severity: Secondary | ICD-10-CM | POA: Insufficient documentation

## 2014-01-14 ENCOUNTER — Non-Acute Institutional Stay (SKILLED_NURSING_FACILITY): Payer: Medicare Other | Admitting: Adult Health

## 2014-01-14 DIAGNOSIS — R5383 Other fatigue: Secondary | ICD-10-CM

## 2014-01-14 DIAGNOSIS — N186 End stage renal disease: Secondary | ICD-10-CM

## 2014-01-14 DIAGNOSIS — Z992 Dependence on renal dialysis: Secondary | ICD-10-CM

## 2014-01-14 DIAGNOSIS — R5381 Other malaise: Secondary | ICD-10-CM

## 2014-01-14 DIAGNOSIS — L97909 Non-pressure chronic ulcer of unspecified part of unspecified lower leg with unspecified severity: Secondary | ICD-10-CM

## 2014-01-14 DIAGNOSIS — L97921 Non-pressure chronic ulcer of unspecified part of left lower leg limited to breakdown of skin: Principal | ICD-10-CM

## 2014-01-14 DIAGNOSIS — R531 Weakness: Secondary | ICD-10-CM

## 2014-01-17 ENCOUNTER — Emergency Department (HOSPITAL_COMMUNITY): Payer: Medicare Other

## 2014-01-17 ENCOUNTER — Encounter (HOSPITAL_COMMUNITY): Payer: Self-pay | Admitting: Emergency Medicine

## 2014-01-17 ENCOUNTER — Inpatient Hospital Stay (HOSPITAL_COMMUNITY)
Admission: EM | Admit: 2014-01-17 | Discharge: 2014-01-21 | DRG: 871 | Disposition: A | Discharge status: Skilled Nursing Facility | Payer: Medicare Other | Attending: Internal Medicine | Admitting: Internal Medicine

## 2014-01-17 DIAGNOSIS — N186 End stage renal disease: Secondary | ICD-10-CM | POA: Diagnosis present

## 2014-01-17 DIAGNOSIS — A4901 Methicillin susceptible Staphylococcus aureus infection, unspecified site: Secondary | ICD-10-CM | POA: Diagnosis present

## 2014-01-17 DIAGNOSIS — I2510 Atherosclerotic heart disease of native coronary artery without angina pectoris: Secondary | ICD-10-CM | POA: Diagnosis present

## 2014-01-17 DIAGNOSIS — R5383 Other fatigue: Secondary | ICD-10-CM

## 2014-01-17 DIAGNOSIS — I252 Old myocardial infarction: Secondary | ICD-10-CM

## 2014-01-17 DIAGNOSIS — I2589 Other forms of chronic ischemic heart disease: Secondary | ICD-10-CM | POA: Diagnosis present

## 2014-01-17 DIAGNOSIS — Z8673 Personal history of transient ischemic attack (TIA), and cerebral infarction without residual deficits: Secondary | ICD-10-CM | POA: Diagnosis not present

## 2014-01-17 DIAGNOSIS — L97809 Non-pressure chronic ulcer of other part of unspecified lower leg with unspecified severity: Secondary | ICD-10-CM | POA: Diagnosis present

## 2014-01-17 DIAGNOSIS — I120 Hypertensive chronic kidney disease with stage 5 chronic kidney disease or end stage renal disease: Secondary | ICD-10-CM | POA: Diagnosis present

## 2014-01-17 DIAGNOSIS — R51 Headache: Secondary | ICD-10-CM

## 2014-01-17 DIAGNOSIS — F3289 Other specified depressive episodes: Secondary | ICD-10-CM | POA: Diagnosis present

## 2014-01-17 DIAGNOSIS — Z801 Family history of malignant neoplasm of trachea, bronchus and lung: Secondary | ICD-10-CM | POA: Diagnosis not present

## 2014-01-17 DIAGNOSIS — Z888 Allergy status to other drugs, medicaments and biological substances status: Secondary | ICD-10-CM

## 2014-01-17 DIAGNOSIS — F329 Major depressive disorder, single episode, unspecified: Secondary | ICD-10-CM | POA: Diagnosis present

## 2014-01-17 DIAGNOSIS — I5021 Acute systolic (congestive) heart failure: Secondary | ICD-10-CM | POA: Diagnosis present

## 2014-01-17 DIAGNOSIS — F411 Generalized anxiety disorder: Secondary | ICD-10-CM | POA: Diagnosis present

## 2014-01-17 DIAGNOSIS — M79605 Pain in left leg: Secondary | ICD-10-CM

## 2014-01-17 DIAGNOSIS — E785 Hyperlipidemia, unspecified: Secondary | ICD-10-CM

## 2014-01-17 DIAGNOSIS — D6489 Other specified anemias: Secondary | ICD-10-CM | POA: Diagnosis present

## 2014-01-17 DIAGNOSIS — R5381 Other malaise: Secondary | ICD-10-CM

## 2014-01-17 DIAGNOSIS — N2581 Secondary hyperparathyroidism of renal origin: Secondary | ICD-10-CM | POA: Diagnosis present

## 2014-01-17 DIAGNOSIS — T07XXXA Unspecified multiple injuries, initial encounter: Secondary | ICD-10-CM

## 2014-01-17 DIAGNOSIS — L97921 Non-pressure chronic ulcer of unspecified part of left lower leg limited to breakdown of skin: Secondary | ICD-10-CM

## 2014-01-17 DIAGNOSIS — K219 Gastro-esophageal reflux disease without esophagitis: Secondary | ICD-10-CM | POA: Diagnosis present

## 2014-01-17 DIAGNOSIS — Z87891 Personal history of nicotine dependence: Secondary | ICD-10-CM | POA: Diagnosis not present

## 2014-01-17 DIAGNOSIS — G9340 Encephalopathy, unspecified: Secondary | ICD-10-CM | POA: Diagnosis present

## 2014-01-17 DIAGNOSIS — R059 Cough, unspecified: Secondary | ICD-10-CM

## 2014-01-17 DIAGNOSIS — Z992 Dependence on renal dialysis: Secondary | ICD-10-CM

## 2014-01-17 DIAGNOSIS — G609 Hereditary and idiopathic neuropathy, unspecified: Secondary | ICD-10-CM | POA: Diagnosis present

## 2014-01-17 DIAGNOSIS — L97929 Non-pressure chronic ulcer of unspecified part of left lower leg with unspecified severity: Secondary | ICD-10-CM

## 2014-01-17 DIAGNOSIS — Z79899 Other long term (current) drug therapy: Secondary | ICD-10-CM | POA: Diagnosis not present

## 2014-01-17 DIAGNOSIS — E46 Unspecified protein-calorie malnutrition: Secondary | ICD-10-CM | POA: Diagnosis present

## 2014-01-17 DIAGNOSIS — R05 Cough: Secondary | ICD-10-CM

## 2014-01-17 DIAGNOSIS — K5900 Constipation, unspecified: Secondary | ICD-10-CM | POA: Diagnosis not present

## 2014-01-17 DIAGNOSIS — I509 Heart failure, unspecified: Secondary | ICD-10-CM | POA: Diagnosis present

## 2014-01-17 DIAGNOSIS — F32A Depression, unspecified: Secondary | ICD-10-CM

## 2014-01-17 DIAGNOSIS — Z803 Family history of malignant neoplasm of breast: Secondary | ICD-10-CM | POA: Diagnosis not present

## 2014-01-17 DIAGNOSIS — E119 Type 2 diabetes mellitus without complications: Secondary | ICD-10-CM | POA: Diagnosis present

## 2014-01-17 DIAGNOSIS — R4182 Altered mental status, unspecified: Secondary | ICD-10-CM | POA: Diagnosis present

## 2014-01-17 DIAGNOSIS — Z8679 Personal history of other diseases of the circulatory system: Secondary | ICD-10-CM

## 2014-01-17 DIAGNOSIS — A419 Sepsis, unspecified organism: Principal | ICD-10-CM

## 2014-01-17 DIAGNOSIS — Z7982 Long term (current) use of aspirin: Secondary | ICD-10-CM | POA: Diagnosis not present

## 2014-01-17 DIAGNOSIS — I219 Acute myocardial infarction, unspecified: Secondary | ICD-10-CM

## 2014-01-17 DIAGNOSIS — F0390 Unspecified dementia without behavioral disturbance: Secondary | ICD-10-CM | POA: Diagnosis present

## 2014-01-17 DIAGNOSIS — R531 Weakness: Secondary | ICD-10-CM

## 2014-01-17 DIAGNOSIS — E089 Diabetes mellitus due to underlying condition without complications: Secondary | ICD-10-CM | POA: Diagnosis present

## 2014-01-17 DIAGNOSIS — L089 Local infection of the skin and subcutaneous tissue, unspecified: Secondary | ICD-10-CM | POA: Diagnosis present

## 2014-01-17 DIAGNOSIS — I150 Renovascular hypertension: Secondary | ICD-10-CM

## 2014-01-17 DIAGNOSIS — T148XXA Other injury of unspecified body region, initial encounter: Secondary | ICD-10-CM

## 2014-01-17 LAB — COMPREHENSIVE METABOLIC PANEL
ALT: 8 U/L (ref 0–53)
AST: 21 U/L (ref 0–37)
Albumin: 2.1 g/dL — ABNORMAL LOW (ref 3.5–5.2)
Alkaline Phosphatase: 94 U/L (ref 39–117)
Anion gap: 18 — ABNORMAL HIGH (ref 5–15)
BUN: 48 mg/dL — ABNORMAL HIGH (ref 6–23)
CALCIUM: 9.1 mg/dL (ref 8.4–10.5)
CO2: 28 mEq/L (ref 19–32)
Chloride: 92 mEq/L — ABNORMAL LOW (ref 96–112)
Creatinine, Ser: 7.11 mg/dL — ABNORMAL HIGH (ref 0.50–1.35)
GFR calc Af Amer: 8 mL/min — ABNORMAL LOW (ref >90)
GFR calc non Af Amer: 7 mL/min — ABNORMAL LOW (ref >90)
Glucose, Bld: 121 mg/dL — ABNORMAL HIGH (ref 70–99)
Potassium: 3.4 mEq/L — ABNORMAL LOW (ref 3.7–5.3)
SODIUM: 138 meq/L (ref 137–147)
TOTAL PROTEIN: 7.4 g/dL (ref 6.0–8.3)
Total Bilirubin: 0.9 mg/dL (ref 0.3–1.2)

## 2014-01-17 LAB — CBC WITH DIFFERENTIAL/PLATELET
BASOS ABS: 0 10*3/uL (ref 0.0–0.1)
Basophils Relative: 0 % (ref 0–1)
EOS ABS: 0.1 10*3/uL (ref 0.0–0.7)
EOS PCT: 1 % (ref 0–5)
HCT: 23.6 % — ABNORMAL LOW (ref 39.0–52.0)
Hemoglobin: 7.2 g/dL — ABNORMAL LOW (ref 13.0–17.0)
Lymphocytes Relative: 7 % — ABNORMAL LOW (ref 12–46)
Lymphs Abs: 1 10*3/uL (ref 0.7–4.0)
MCH: 25.5 pg — AB (ref 26.0–34.0)
MCHC: 30.5 g/dL (ref 30.0–36.0)
MCV: 83.7 fL (ref 78.0–100.0)
Monocytes Absolute: 1.5 10*3/uL — ABNORMAL HIGH (ref 0.1–1.0)
Monocytes Relative: 11 % (ref 3–12)
Neutro Abs: 11.8 10*3/uL — ABNORMAL HIGH (ref 1.7–7.7)
Neutrophils Relative %: 81 % — ABNORMAL HIGH (ref 43–77)
PLATELETS: 423 10*3/uL — AB (ref 150–400)
RBC: 2.82 MIL/uL — ABNORMAL LOW (ref 4.22–5.81)
RDW: 16.7 % — ABNORMAL HIGH (ref 11.5–15.5)
WBC: 14.4 10*3/uL — AB (ref 4.0–10.5)

## 2014-01-17 LAB — TROPONIN I: TROPONIN I: 0.33 ng/mL — AB (ref <0.30)

## 2014-01-17 LAB — PREPARE RBC (CROSSMATCH)

## 2014-01-17 LAB — PHOSPHORUS: Phosphorus: 3.6 mg/dL (ref 2.3–4.6)

## 2014-01-17 LAB — GLUCOSE, CAPILLARY
GLUCOSE-CAPILLARY: 141 mg/dL — AB (ref 70–99)
Glucose-Capillary: 133 mg/dL — ABNORMAL HIGH (ref 70–99)

## 2014-01-17 LAB — MRSA PCR SCREENING: MRSA by PCR: NEGATIVE

## 2014-01-17 LAB — ABO/RH: ABO/RH(D): B POS

## 2014-01-17 LAB — PRO B NATRIURETIC PEPTIDE: Pro B Natriuretic peptide (BNP): >70000 pg/mL — ABNORMAL HIGH (ref 0–450)

## 2014-01-17 LAB — I-STAT CG4 LACTIC ACID, ED: Lactic Acid, Venous: 1.25 mmol/L (ref 0.5–2.2)

## 2014-01-17 MED ORDER — ONDANSETRON HCL 4 MG PO TABS: 4.0000 mg | ORAL_TABLET | Freq: Four times a day (QID) | ORAL | Status: DC | PRN

## 2014-01-17 MED ORDER — ASPIRIN 325 MG PO TABS
325.0000 mg | ORAL_TABLET | Freq: Every evening | ORAL | Status: DC
  Administered 2014-01-17 – 2014-01-20 (×4): 325 mg via ORAL
  Filled 2014-01-17 (×5): qty 1

## 2014-01-17 MED ORDER — HEPARIN SODIUM (PORCINE) 5000 UNIT/ML IJ SOLN
5000.0000 [IU] | Freq: Three times a day (TID) | INTRAMUSCULAR | Status: DC
  Administered 2014-01-17 – 2014-01-21 (×11): 5000 [IU] via SUBCUTANEOUS
  Filled 2014-01-17 (×13): qty 1

## 2014-01-17 MED ORDER — SODIUM CHLORIDE 0.9 % IV SOLN
125.0000 mg | INTRAVENOUS | Status: DC
  Filled 2014-01-17 (×3): qty 10

## 2014-01-17 MED ORDER — ACETAMINOPHEN 325 MG PO TABS
650.0000 mg | ORAL_TABLET | Freq: Four times a day (QID) | ORAL | Status: DC | PRN
  Administered 2014-01-20: 650 mg via ORAL
  Filled 2014-01-17 (×2): qty 2

## 2014-01-17 MED ORDER — HYDROCODONE-ACETAMINOPHEN 5-325 MG PO TABS
1.0000 | ORAL_TABLET | Freq: Four times a day (QID) | ORAL | Status: DC | PRN
  Administered 2014-01-17 – 2014-01-21 (×7): 1 via ORAL
  Filled 2014-01-17 (×6): qty 1

## 2014-01-17 MED ORDER — PIPERACILLIN-TAZOBACTAM IN DEX 2-0.25 GM/50ML IV SOLN
2.2500 g | Freq: Four times a day (QID) | INTRAVENOUS | Status: DC
  Administered 2014-01-18 – 2014-01-19 (×5): 2.25 g via INTRAVENOUS
  Filled 2014-01-17 (×10): qty 50

## 2014-01-17 MED ORDER — VANCOMYCIN HCL IN DEXTROSE 1-5 GM/200ML-% IV SOLN
1000.0000 mg | Freq: Once | INTRAVENOUS | Status: AC
  Administered 2014-01-17: 1000 mg via INTRAVENOUS
  Filled 2014-01-17: qty 200

## 2014-01-17 MED ORDER — DARBEPOETIN ALFA-POLYSORBATE 100 MCG/0.5ML IJ SOLN
100.0000 ug | INTRAMUSCULAR | Status: DC
  Administered 2014-01-21: 100 ug via INTRAVENOUS
  Filled 2014-01-17: qty 0.5

## 2014-01-17 MED ORDER — ISOSORB DINITRATE-HYDRALAZINE 20-37.5 MG PO TABS
1.0000 | ORAL_TABLET | Freq: Two times a day (BID) | ORAL | Status: DC
  Administered 2014-01-17 – 2014-01-20 (×6): 1 via ORAL
  Filled 2014-01-17 (×10): qty 1

## 2014-01-17 MED ORDER — HYDROCODONE-ACETAMINOPHEN 5-325 MG PO TABS
ORAL_TABLET | ORAL | Status: AC
  Filled 2014-01-17: qty 1

## 2014-01-17 MED ORDER — SODIUM CHLORIDE 0.9 % IV SOLN: Freq: Once | INTRAVENOUS | Status: DC

## 2014-01-17 MED ORDER — CARVEDILOL 3.125 MG PO TABS
3.1250 mg | ORAL_TABLET | Freq: Two times a day (BID) | ORAL | Status: DC
  Administered 2014-01-17 – 2014-01-20 (×6): 3.125 mg via ORAL
  Filled 2014-01-17 (×10): qty 1

## 2014-01-17 MED ORDER — ATORVASTATIN CALCIUM 20 MG PO TABS
20.0000 mg | ORAL_TABLET | Freq: Every day | ORAL | Status: DC
  Administered 2014-01-17 – 2014-01-20 (×4): 20 mg via ORAL
  Filled 2014-01-17 (×5): qty 1

## 2014-01-17 MED ORDER — VANCOMYCIN HCL IN DEXTROSE 1-5 GM/200ML-% IV SOLN
1000.0000 mg | INTRAVENOUS | Status: AC
  Administered 2014-01-17: 1000 mg via INTRAVENOUS
  Filled 2014-01-17 (×3): qty 200

## 2014-01-17 MED ORDER — ALBUTEROL SULFATE (2.5 MG/3ML) 0.083% IN NEBU: 2.5000 mg | INHALATION_SOLUTION | RESPIRATORY_TRACT | Status: DC | PRN

## 2014-01-17 MED ORDER — BOOST / RESOURCE BREEZE PO LIQD
1.0000 | Freq: Two times a day (BID) | ORAL | Status: DC
Start: 2014-01-17 — End: 2014-01-21
  Administered 2014-01-18 – 2014-01-21 (×5): 1 via ORAL

## 2014-01-17 MED ORDER — ONDANSETRON HCL 4 MG/2ML IJ SOLN: 4.0000 mg | Freq: Four times a day (QID) | INTRAMUSCULAR | Status: DC | PRN

## 2014-01-17 MED ORDER — PIPERACILLIN-TAZOBACTAM 3.375 G IVPB 30 MIN
3.3750 g | Freq: Once | INTRAVENOUS | Status: AC
  Administered 2014-01-17: 3.375 g via INTRAVENOUS
  Filled 2014-01-17: qty 50

## 2014-01-17 MED ORDER — SODIUM CHLORIDE 0.9 % IJ SOLN
3.0000 mL | Freq: Two times a day (BID) | INTRAMUSCULAR | Status: DC
  Administered 2014-01-17 – 2014-01-21 (×7): 3 mL via INTRAVENOUS

## 2014-01-17 MED ORDER — RENA-VITE PO TABS
1.0000 | ORAL_TABLET | Freq: Every day | ORAL | Status: DC
Start: 2014-01-17 — End: 2014-01-21
  Administered 2014-01-17 – 2014-01-20 (×4): 1 via ORAL
  Filled 2014-01-17 (×6): qty 1

## 2014-01-17 MED ORDER — FENTANYL CITRATE 0.05 MG/ML IJ SOLN
25.0000 ug | Freq: Once | INTRAMUSCULAR | Status: AC
  Administered 2014-01-17: 25 ug via INTRAVENOUS
  Filled 2014-01-17: qty 2

## 2014-01-17 MED ORDER — PAROXETINE HCL 20 MG PO TABS
20.0000 mg | ORAL_TABLET | Freq: Every day | ORAL | Status: DC
  Administered 2014-01-17 – 2014-01-21 (×5): 20 mg via ORAL
  Filled 2014-01-17 (×5): qty 1

## 2014-01-17 NOTE — ED Notes (Signed)
Attempted report 

## 2014-01-17 NOTE — ED Notes (Signed)
Pt to CT via stretcher

## 2014-01-17 NOTE — Progress Notes (Signed)
Admission note:  Arrival Method: Patient came to this unit on a stretcher accompanied by staff from ED. Mental Orientation: A&O x3 , confusion noted with the situation. Telemetry: Patient is on Telemetry and CCMD notified. Assessment: See Doc Flowsheets Skin: LLE wound medial side with guaze wrapped around the left lower leg in the ED. IV: Peripheral IV Right Upper Arm. Pain: No pain at this moment. Fall Prevention Safety Plan:  Fall prevention plan was explained to patient.  He understood and acknowledged. Admission Screening: In progress 6700 Orientation: Patient has been oriented to the unit, staff and to the room.

## 2014-01-17 NOTE — ED Notes (Signed)
Shanita Chaffin(Daughter): [phone]

## 2014-01-17 NOTE — Progress Notes (Signed)
ANTIBIOTIC CONSULT NOTE - INITIAL  Pharmacy Consult for Vancomycin and Zosyn  Indication: rule out sepsis  Allergies  Allergen Reactions  . Benazepril Hcl Hives    Patient Measurements:   Wt Readings from Last 3 Encounters:  12/26/13 188 lb 6.4 oz (85.458 kg)  12/24/13 190 lb 7.6 oz (86.4 kg)  12/16/13 192 lb 3.9 oz (87.2 kg)     Vital Signs: Temp: 98.3 F (36.8 C) (08/08 0748) Temp src: Oral (08/08 0748) BP: 142/77 mmHg (08/08 0748) Pulse Rate: 96 (08/08 0748) Intake/Output from previous day:   Intake/Output from this shift:    Labs: No results found for this basename: WBC, HGB, PLT, LABCREA, CREATININE,  in the last 72 hours The CrCl is unknown because both a height and weight (above a minimum accepted value) are required for this calculation. No results found for this basename: VANCOTROUGH, VANCOPEAK, VANCORANDOM, GENTTROUGH, GENTPEAK, GENTRANDOM, TOBRATROUGH, TOBRAPEAK, TOBRARND, AMIKACINPEAK, AMIKACINTROU, AMIKACIN,  in the last 72 hours   Microbiology: No results found for this or any previous visit (from the past 720 hour(s)).  Medical History: Past Medical History  Diagnosis Date  . Hyperparathyroidism   . Arthritis   . Hyperlipidemia   . Hypertension   . Neuromuscular disorder   . Stroke   . GERD (gastroesophageal reflux disease)   . Headache(784.0)     occasional  migraine   . Heart attack 2011; 1990's  . Type II diabetes mellitus   . Peripheral neuropathy   . Anxiety   . Depression   . Dialysis patient     M, W, F; [place] (09/19/11)  . Chronic kidney disease     Hemo MWF    Medications:  Anti-infectives   Start     Dose/Rate Route Frequency Ordered Stop   01/17/14 0815  piperacillin-tazobactam (ZOSYN) IVPB 3.375 g     3.375 g 100 mL/hr over 30 Minutes Intravenous  Once 01/17/14 0802     01/17/14 0815  vancomycin (VANCOCIN) IVPB 1000 mg/200 mL premix     1,000 mg 200 mL/hr over 60 Minutes Intravenous  Once 01/17/14 0802        Assessment: 78 year old male with ESRD and a chronic left lower extremity wound presenting with weakness.  He is to continue empiric antibiotic therapy with Vancomycin and Zosyn.  He will need additional Vancomycin to complete a loading dose, and his Zosyn regimen will require adjustment for HD-dependent ESRD.  I attempted to discuss with the patient but he was unresponsive to me.  Goal of Therapy:  preHD Vancomycin level 15-25 mcg/ml  Plan:  Give an additional Vancomycin 1gm for a total loading dose of 2gm. Continue Zosyn with 2.25gm IV q6h Vancomycin 1gm after each HD session Follow for HD order and tolerance  Legrand Como, Pharm.D., BCPS, AAHIVP Clinical Pharmacist Phone: [phone] or [phone] 01/17/2014, 8:15 AM

## 2014-01-17 NOTE — ED Notes (Signed)
Pt arrived from home by [facility]. Pt missed dialysis yesterday he stated that he normally goes Mondays, Wednesdays and Fridays. Dialysis center was the ones to called EMS because they were unable to get in touch with pt. Per EMS pt is very weak. Denies any pain other that pt feels like there is a pressure sore coming to coccyx area. No wound seen to coccyx area. Pt does have wound to left lower leg that is dressed everyday by a home health nurse but pt stated that the last time it was changed was Wednesday. Pt has also been coughing up blood tinged sputum since yesterday evening. bp-140/40 hr-97 02sat 93%ra 2lpm via nasal cannula applied increased O2sats-95%

## 2014-01-17 NOTE — H&P (Signed)
History and Physical  Sean [name] YCX:[id number] DOB: June 16, 1935 DOA: 01/17/2014  Referring physician: Dr. Pryor Curia, EDP PCP: Philis Fendt, MD  Outpatient Specialists:  1. Nephrology  Chief Complaint: Altered Mental Status.  HPI: Sean [name] is a 78 y.o. male with history of ESRD on MWF hemodialysis, CAD and MI 2011, hypertension, hyperlipidemia, chronic calciphylaxis and open wound LLE, secondary hyperparathyroidism, ischemic cardiomyopathy with EF on 12/15/13:45-50%, nuclear stress test 12/14/13 that did not show any reversible ischemia but showed EF of 25%, hyperlipidemia, DM 2, couple of recent hospitalizations in the last one 12/20/13-12/24/13 for leg pain, presents from home with altered mental status. After recent hospitalization, he was discharged to skilled nursing facility and was discharged from there to home on 01/15/14. His last dialysis was on 01/14/14. He missed dialysis yesterday. The dialysis center was unable to get in touch with patient and called EMS. EMS reported patient was complaining of feeling weak and sore in the coccyx area. Patient unable to provide significant history secondary to altered mental status. He earlier complained of headache but currently denies any complaints including headache or chest pain. He does complain of left leg pain at site of wound with leg movement. He is unable to tell whether he has dyspnea, cough or fevers. According to discussion with patient's daughter, he gets confused whenever he misses dialysis. Family is concerned about his unsafe home living situation by himself and will pursue nursing home placement at discharge. In the ED, left leg wound appeared infected, tachycardic, WBC 14.4, potassium 3.4, creatinine 7.11, hemoglobin 7.2, troponin 0.33 BNP >70,000, CT head without acute findings and chest x-ray are with cardiomegaly and mild interstitial edema. Patient has been empirically started on IV vancomycin and Zosyn and hospitalist  admission requested.   Review of Systems: All systems reviewed and apart from history of presenting illness, are negative.  Past Medical History  Diagnosis Date  . Hyperparathyroidism   . Arthritis   . Hyperlipidemia   . Hypertension   . Neuromuscular disorder   . Stroke   . GERD (gastroesophageal reflux disease)   . Headache(784.0)     occasional  migraine   . Heart attack 2011; 1990's  . Type II diabetes mellitus   . Peripheral neuropathy   . Anxiety   . Depression   . Dialysis patient     M, W, F; [facility] (09/19/11)  . Chronic kidney disease     Hemo MWF   Past Surgical History  Procedure Laterality Date  . Colon surgery  2011    colon resection   . Parathyroidectomy  2011  . Capd insertion  09/12/2011    Procedure: LAPAROSCOPIC INSERTION CONTINUOUS AMBULATORY PERITONEAL DIALYSIS  (CAPD) CATHETER;  Surgeon: Edward Jolly, MD;  Location: [facility];  Service: General;  Laterality: N/A;  . Sp av dialysis shunt access existing *l*  2003  . Dg av dialysis shunt intro ndl*r* or      "not working" (09/19/11)  . Capd removal  03/12/2012    Procedure: CONTINUOUS AMBULATORY PERITONEAL DIALYSIS  (CAPD) CATHETER REMOVAL;  Surgeon: Edward Jolly, MD;  Location: [facility];  Service: General;  Laterality: N/A;   Social History:  reports that he quit smoking about 37 years ago. His smoking use included Cigarettes. He has a 1.75 pack-year smoking history. He has never used smokeless tobacco. He reports that he drinks alcohol. He reports that he does not use illicit drugs. Single. Recently discharged from skilled nursing facility  on 01/15/14. Lives alone. Apparently ambulates with the help of a walker.  Allergies  Allergen Reactions  . Benazepril Hcl Hives    Family History  Problem Relation Age of Onset  . Cancer Sister     breast  . Cancer Brother     lung  . Anesthesia problems Neg Hx   . Hypotension Neg Hx   . Malignant hyperthermia Neg Hx   . Pseudochol deficiency  Neg Hx     Prior to Admission medications   Medication Sig Start Date End Date Taking? Authorizing Provider  acetaminophen (TYLENOL) 325 MG tablet Take 2 tablets (650 mg total) by mouth every 6 (six) hours as needed for mild pain, fever or headache. 12/24/13  Yes Debbe Odea, MD  aspirin 325 MG tablet Take 325 mg by mouth every evening.   Yes Historical Provider, MD  atorvastatin (LIPITOR) 20 MG tablet Take 20 mg by mouth at bedtime.    Historical Provider, MD  b complex-vitamin c-folic acid (NEPHRO-VITE) 0.8 MG TABS Take 0.8 mg by mouth at bedtime.    Historical Provider, MD  carvedilol (COREG) 3.125 MG tablet Take 1 tablet (3.125 mg total) by mouth 2 (two) times daily with a meal. 12/16/13   Debbe Odea, MD  HYDROcodone-acetaminophen (NORCO/VICODIN) 5-325 MG per tablet Take 1 tablet by mouth every 6 (six) hours as needed for moderate pain or severe pain. 12/25/13   Pricilla Larsson, NP  hydrOXYzine (ATARAX/VISTARIL) 10 MG tablet Take 10 mg by mouth at bedtime as needed. For sleep    Historical Provider, MD  isosorbide-hydrALAZINE (BIDIL) 20-37.5 MG per tablet Take 1 tablet by mouth 2 (two) times daily. 12/16/13   Debbe Odea, MD  PARoxetine (PAXIL) 20 MG tablet Take 20 mg by mouth daily.    Historical Provider, MD   Physical Exam: Filed Vitals:   01/17/14 0749 01/17/14 0945 01/17/14 1141 01/17/14 1200  BP:  137/74  108/94  Pulse:  94  82  Temp:   98.6 F (37 C)   TempSrc:   Rectal   Resp:  21  22  SpO2: 95% 95%  93%     General exam: Moderately built and nourished pleasant elderly male patient, lying comfortably supine on the gurney in no obvious distress.  Head, eyes and ENT: Nontraumatic and normocephalic. Right pupil is irregular. Left pupil reacting to light. Bilateral arcus senilis. Oral mucosa moist.  Neck: Supple. No JVD, carotid bruit or thyromegaly.  Lymphatics: No lymphadenopathy.  Respiratory system: Slightly diminished breath sounds in the bases with occasional basal  crackles but otherwise [name] to auscultation. No increased work of breathing.  Cardiovascular system: S1 and S2 heard, RRR. No JVD, murmurs, gallops, clicks or pedal edema.  Gastrointestinal system: Abdomen is nondistended, soft and nontender. Normal bowel sounds heard. No organomegaly or masses appreciated.  Central nervous system: Alert and oriented only to self. No focal neurological deficits.  Extremities: Symmetric 5 x 5 power. Peripheral pulses symmetrically felt. Large open wound occupying most of left anterior leg. Upper half of this wound is clean with pink granulation tissue. Lower half has sloughy base, foul-smelling and visible tendons. Extensive scarring of right leg without open wounds.  Skin: No rashes or acute findings.  Musculoskeletal system: Negative exam.  Psychiatry: Pleasant and cooperative.   Labs on Admission:  Basic Metabolic Panel:  Recent Labs Lab 01/17/14 0940  NA 138  K 3.4*  CL 92*  CO2 28  GLUCOSE 121*  BUN 48*  CREATININE 7.11*  CALCIUM  9.1  PHOS 3.6   Liver Function Tests:  Recent Labs Lab 01/17/14 0940  AST 21  ALT 8  ALKPHOS 94  BILITOT 0.9  PROT 7.4  ALBUMIN 2.1*   No results found for this basename: LIPASE, AMYLASE,  in the last 168 hours No results found for this basename: AMMONIA,  in the last 168 hours CBC:  Recent Labs Lab 01/17/14 0940  WBC 14.4*  NEUTROABS 11.8*  HGB 7.2*  HCT 23.6*  MCV 83.7  PLT 423*   Cardiac Enzymes:  Recent Labs Lab 01/17/14 0800  TROPONINI 0.33*    BNP (last 3 results)  Recent Labs  12/13/13 1408 01/17/14 0800  PROBNP >70000.0* >70000.0*   CBG: No results found for this basename: GLUCAP,  in the last 168 hours  Radiological Exams on Admission: Ct Head Wo Contrast  01/17/2014   CLINICAL DATA:  Altered mental status.  Weakness.  Dialysis patient.  EXAM: CT HEAD WITHOUT CONTRAST  TECHNIQUE: Contiguous axial images were obtained from the base of the skull through the vertex  without intravenous contrast.  COMPARISON:  12/13/2013.  FINDINGS: Stable cerebral atrophy, ventriculomegaly and periventricular white matter disease. Remote left occipital infarct with encephalomalacia ex vacuo dilatation of the occipital horn of the left lateral ventricle. No extra-axial fluid collections are identified. No CT findings for acute hemispheric infarction or intracranial hemorrhage. No mass lesions. The brainstem and cerebellum are grossly normal and stable.  Stable dense vascular calcifications. The bony structures are intact. No fracture or bone lesion. The paranasal sinuses and mastoid air cells are [name]. The globes are intact.  IMPRESSION: Stable atrophy, ventriculomegaly and periventricular white matter disease.  Remote left occipital infarct.  No acute intracranial findings or mass lesions.   Electronically Signed   By: Kalman Jewels M.D.   On: 01/17/2014 08:28   Dg Chest Port 1 View  01/17/2014   CLINICAL DATA:  Weakness.  History of end-stage renal disease.  EXAM: PORTABLE CHEST - 1 VIEW  COMPARISON:  12/20/2013  FINDINGS: There is stable mild cardiac enlargement. Lungs show evidence of mild interstitial edema. There is no evidence of airspace consolidation, pneumothorax, nodule or pleural fluid.  IMPRESSION: Cardiomegaly and mild interstitial edema.   Electronically Signed   By: Aletta Edouard M.D.   On: 01/17/2014 09:41    EKG: Independently reviewed. Sinus rhythm with LBBB-not new.  Assessment/Plan Principal Problem:   Calciphylaxis of left lower extremity with nonhealing ulcer, limited to breakdown of skin Active Problems:   HYPERLIPIDEMIA   End stage renal disease on dialysis   CEREBROVASCULAR ACCIDENT, HX OF   Diabetes mellitus due to underlying condition   CAD in native artery   Secondary hyperparathyroidism   Renovascular hypertension   Sepsis   Infected open wound   Acute encephalopathy   1. Chronic left leg wound from calciphylaxis, infected: Wound care  consultation. Treat empirically with IV vancomycin and Zosyn. 2. Early sepsis, present on admission: Secondary to problem #1. Management as above. Hemodynamically stable. 3. Acute encephalopathy: Likely secondary to missed hemodialysis/uremia. No focal deficits. CT head without acute findings. Could also be due to sepsis. Treat with antibiotics and patient will undergo hemodialysis today. 4. ESRD on MWF hemodialysis: Nephrology has been consulted and plans for dialysis later today. 5. History of hyperlipidemia continue statins 6. History of CAD/cardiomyopathy/mild acute systolic CHF from volume overload: No reported chest pains. Minimally elevated troponin-however decreasing compared to recent admission. Recent stress test without reversible ischemia and patient declined. Continue aspirin, carvedilol  and Bidil. Hemodialysis for volume management. 7. Type II DM: Not on medications at home. Check CBGs. 8. Secondary hyperparathyroidism: Management per nephrology. 9. Hypertension: Controlled. Continue carvedilol.     Code Status: Full  Family Communication: Discussed with patient's daughter Ms. Erling Cruz.  Disposition Plan: Possibly SNF when medically stable.   Time spent: 44 minutes  Amulya Quintin, MD, FACP, FHM. [facility] Pager [phone]  If 7PM-7AM, please contact night-coverage [email or web address] Password [email or web address] 01/17/2014, 1:07 PM

## 2014-01-17 NOTE — ED Notes (Signed)
Patient returned from CT via [facility] from RRT at the bedside

## 2014-01-17 NOTE — Procedures (Signed)
Pt seen on HD.  On 4K bath.  Plan to give 2 units PRBC's   Ap 10 Vp 50  BFR 150.  Tx just getting started.

## 2014-01-17 NOTE — ED Provider Notes (Signed)
TIME SEEN: 8:02 AM  CHIEF COMPLAINT: Altered mental status  HPI: Patient is a 78 year old male history of hypertension, hyperlipidemia, prior stroke, coronary artery disease status post heart attack, systolic heart failure, chronic kidney disease on dialysis Monday, Wednesday and Friday who presents to the emergency department with multiple complaints and altered mental status. It appears patient missed his dialysis yesterday her dialysis Center has been unable to get in touch with the patient today called EMS. EMS reports patient was complaining of feeling weak and feeling that there is a sore on his coccyx area. To me he complains of headache that is similar to his prior migraines and also lower back pain that has been present for several weeks. He is unable to tell me when the last time he had dialysis. He is unable to tell me the year or date. He is unable to tell me if he wears oxygen at home. He denies that he's had any chest pain but states he is having shortness of breath. Denies any numbness, tingling or focal weakness that is new. He has a large left leg wound that is foul smelling with purulent drainage. He is unable to tell me when the last time his stress and has been changed. He states that he lives alone.  ROS: Unobtainable secondary to patient's altered mental status  PAST MEDICAL HISTORY/PAST SURGICAL HISTORY:  Past Medical History  Diagnosis Date  . Hyperparathyroidism   . Arthritis   . Hyperlipidemia   . Hypertension   . Neuromuscular disorder   . Stroke   . GERD (gastroesophageal reflux disease)   . Headache(784.0)     occasional  migraine   . Heart attack 2011; 1990's  . Type II diabetes mellitus   . Peripheral neuropathy   . Anxiety   . Depression   . Dialysis patient     M, W, F; [place] (09/19/11)  . Chronic kidney disease     Hemo MWF    MEDICATIONS:  Prior to Admission medications   Medication Sig Start Date End Date Taking? Authorizing Provider   acetaminophen (TYLENOL) 325 MG tablet Take 2 tablets (650 mg total) by mouth every 6 (six) hours as needed for mild pain, fever or headache. 12/24/13   Debbe Odea, MD  aspirin 325 MG tablet Take 325 mg by mouth every evening.    Historical Provider, MD  atorvastatin (LIPITOR) 20 MG tablet Take 20 mg by mouth at bedtime.    Historical Provider, MD  b complex-vitamin c-folic acid (NEPHRO-VITE) 0.8 MG TABS Take 0.8 mg by mouth at bedtime.    Historical Provider, MD  carvedilol (COREG) 3.125 MG tablet Take 1 tablet (3.125 mg total) by mouth 2 (two) times daily with a meal. 12/16/13   Debbe Odea, MD  HYDROcodone-acetaminophen (NORCO/VICODIN) 5-325 MG per tablet Take 1 tablet by mouth every 6 (six) hours as needed for moderate pain or severe pain. 12/25/13   Pricilla Larsson, NP  hydrOXYzine (ATARAX/VISTARIL) 10 MG tablet Take 10 mg by mouth at bedtime as needed. For sleep    Historical Provider, MD  isosorbide-hydrALAZINE (BIDIL) 20-37.5 MG per tablet Take 1 tablet by mouth 2 (two) times daily. 12/16/13   Debbe Odea, MD  PARoxetine (PAXIL) 20 MG tablet Take 20 mg by mouth daily.    Historical Provider, MD    ALLERGIES:  Allergies  Allergen Reactions  . Benazepril Hcl Hives    SOCIAL HISTORY:  History  Substance Use Topics  . Smoking status: Former Smoker --  0.25 packs/day for 7 years    Types: Cigarettes    Quit date: 06/12/1976  . Smokeless tobacco: Never Used  . Alcohol Use: Yes     Comment: 09/19/11 "occasionally; last alcohol 2011"    FAMILY HISTORY: Family History  Problem Relation Age of Onset  . Cancer Sister     breast  . Cancer Brother     lung  . Anesthesia problems Neg Hx   . Hypotension Neg Hx   . Malignant hyperthermia Neg Hx   . Pseudochol deficiency Neg Hx     EXAM: BP 142/77  Pulse 96  Temp(Src) 98.3 F (36.8 C) (Oral)  Resp 18  SpO2 95% CONSTITUTIONAL: Alert and oriented to person and place but not time, unable to answer questions appropriately, appears  uncomfortable but nontoxic HEAD: Normocephalic EYES: Conjunctivae clear, PERRL ENT: normal nose; no rhinorrhea; dry mucous membranes; pharynx without lesions noted NECK: Supple, no meningismus, no LAD  CARD: Regular and tachycardic; S1 and S2 appreciated; no murmurs, no clicks, no rubs, no gallops RESP: Normal chest excursion without splinting or tachypnea; it has mild bibasilar crackles, no rhonchi or wheezing, mild hypoxia on room air ABD/GI: Normal bowel sounds; non-distended; soft, non-tender, no rebound, no guarding BACK:  The back appears normal and is non-tender to palpation, there is no CVA tenderness EXT: Patient has an extremely large left lower extremity wound from his knee to his ankle with significant skin breakdown, foul odor and copious amounts of purulent drainage, no obvious surrounding erythema the leg is warm, no calf tenderness, 1+ DP pulses in bilateral lower extremities, no joint effusion, Normal ROM in all joints; otherwise extremities are non-tender to palpation; no edema; normal capillary refill; no cyanosis    SKIN: Normal color for age and race; warm NEURO: Moves all extremities equally, cranial nerves II through XII intact, sensation to light touch intact diffusely, no pronator drift PSYCH: The patient's mood and manner are appropriate. Grooming and personal hygiene are appropriate.  MEDICAL DECISION MAKING: Patient here with multiple complaints. It is unclear what his neurologic baseline is but he is unable to answer most questions for me. It appears he has missed dialysis for several days and has a new oxygen requirement and bibasilar crackles. He does not however appear volume overloaded otherwise. He has a large left lower extremity leg wound that appears to have been reported in prior notes but appears acutely infected today. He is complaining of headache but does have a history of migraines. He is complaining of back pain but is neurologically intact. He is  tachycardic, afebrile and normotensive. Given his leg appears infected and he is tachycardic, patient may need sepsis criteria once his labs are back. We'll go ahead and start empiric antibiotics and send cultures of the wound, blood cultures. Given his possible altered mental status, will obtain a CT of his head. We'll also obtain a chest x-ray, labs. I feel patient will likely need admission to the hospital.  ED PROGRESS: Patient's labs show a leukocytosis of 14.4 with left shift. Lactate normal. We did blood cultures pending. He has received broad-spectrum antibiotics. Heart rate has improved with pain control. His chest x-ray shows mild interstitial edema and he does have a mild oxygen requirement. Oxygen saturation was 90% on room air at rest. He will need dialysis likely today because of his altered mental status which may be secondary to uremia and new hypoxia. His potassium is 3.4. Troponin is slightly elevated but this is chronic for patient  and likely due to his chronic kidney disease. Given his initial tachycardia, leukocytosis and what appears to be an infected left lower extremity, will admit. We'll discuss with hospitalist for admission.   11:36 PM  Spoke with Dr. Algis Liming hospitalist service for admission to telemetry, inpatient. Sonnie Alamo PA with nephrology has seen the patient. She agrees that he needs dialysis today. She reports that he is normally oriented x3 and he is off of his baseline today.   EKG Interpretation  Date/Time:  Saturday January 17 2014 07:45:53 EDT Ventricular Rate:  93 PR Interval:  163 QRS Duration: 156 QT Interval:  413 QTC Calculation: 514 R Axis:   -28 Text Interpretation:  Sinus arrhythmia Left bundle branch block No significant change since last tracing Confirmed by WARD,  DO, KRISTEN ([id number]) on 01/17/2014 8:12:33 AM         CRITICAL CARE Performed by: Nyra Jabs   Total critical care time: 45 minutes  Critical care time was exclusive of  separately billable procedures and treating other patients.  Sepsis, AMS, uremia, hypoxia.  Critical care was necessary to treat or prevent imminent or life-threatening deterioration.  Critical care was time spent personally by me on the following activities: development of treatment plan with patient and/or surrogate as well as nursing, discussions with consultants, evaluation of patient's response to treatment, examination of patient, obtaining history from patient or surrogate, ordering and performing treatments and interventions, ordering and review of laboratory studies, ordering and review of radiographic studies, pulse oximetry and re-evaluation of patient's condition.   [place], DO 01/17/14 1138

## 2014-01-17 NOTE — Consult Note (Signed)
[facility] Renal Consultation Note    Indication for Consultation:  Management of ESRD/hemodialysis; anemia, hypertension/volume and secondary hyperparathyroidism  HPI: Sean [name] is a 78 y.o. male with history significant for DM, CAD, and recurrent calciphylaxis who was found at home weak and disoriented today by EMS, who were sent to perform a wellness check when he failed to show up for his hemodialysis treatment on Friday.  (He lives alone and staff were unable to contact him by phone.)  He is very talkative but unable to recall events of this week and has difficulty answering simple questions. He has a longstanding wound (? Calciphylaxis)  to his left lower extremity that is malodorous and draining purulent fluid, but it is unclear how long is has been since wound care has visited his home for dressing changes as they had been doing previously. Hypoxia and tachycardia were noted upon admission. CXR significant for pulmonary edema. CT head is negative for acute process. Blood and wound cultures are pending.   Past Medical History  Diagnosis Date  . Hyperparathyroidism   . Arthritis   . Hyperlipidemia   . Hypertension   . Neuromuscular disorder   . Stroke   . GERD (gastroesophageal reflux disease)   . Headache(784.0)     occasional  migraine   . Heart attack 2011; 1990's  . Type II diabetes mellitus   . Peripheral neuropathy   . Anxiety   . Depression   . Dialysis patient     M, W, F; [place] (09/19/11)  . Chronic kidney disease     Hemo MWF   Past Surgical History  Procedure Laterality Date  . Colon surgery  2011    colon resection   . Parathyroidectomy  2011  . Capd insertion  09/12/2011    Procedure: LAPAROSCOPIC INSERTION CONTINUOUS AMBULATORY PERITONEAL DIALYSIS  (CAPD) CATHETER;  Surgeon: Edward Jolly, MD;  Location: [place];  Service: General;  Laterality: N/A;  . Sp av dialysis shunt access existing *l*  2003  . Dg av dialysis shunt  intro ndl*r* or      "not working" (09/19/11)  . Capd removal  03/12/2012    Procedure: CONTINUOUS AMBULATORY PERITONEAL DIALYSIS  (CAPD) CATHETER REMOVAL;  Surgeon: Edward Jolly, MD;  Location: [facility];  Service: General;  Laterality: N/A;   Family History  Problem Relation Age of Onset  . Cancer Sister     breast  . Cancer Brother     lung  . Anesthesia problems Neg Hx   . Hypotension Neg Hx   . Malignant hyperthermia Neg Hx   . Pseudochol deficiency Neg Hx    Social History:  reports that he quit smoking about 37 years ago. His smoking use included Cigarettes. He has a 1.75 pack-year smoking history. He has never used smokeless tobacco. He reports that he drinks alcohol. He reports that he does not use illicit drugs. Allergies  Allergen Reactions  . Benazepril Hcl Hives   Prior to Admission medications   Medication Sig Start Date End Date Taking? Authorizing Provider  acetaminophen (TYLENOL) 325 MG tablet Take 2 tablets (650 mg total) by mouth every 6 (six) hours as needed for mild pain, fever or headache. 12/24/13  Yes Debbe Odea, MD  aspirin 325 MG tablet Take 325 mg by mouth every evening.   Yes Historical Provider, MD  atorvastatin (LIPITOR) 20 MG tablet Take 20 mg by mouth at bedtime.    Historical Provider, MD  b complex-vitamin  c-folic acid (NEPHRO-VITE) 0.8 MG TABS Take 0.8 mg by mouth at bedtime.    Historical Provider, MD  carvedilol (COREG) 3.125 MG tablet Take 1 tablet (3.125 mg total) by mouth 2 (two) times daily with a meal. 12/16/13   Debbe Odea, MD  HYDROcodone-acetaminophen (NORCO/VICODIN) 5-325 MG per tablet Take 1 tablet by mouth every 6 (six) hours as needed for moderate pain or severe pain. 12/25/13   Pricilla Larsson, NP  hydrOXYzine (ATARAX/VISTARIL) 10 MG tablet Take 10 mg by mouth at bedtime as needed. For sleep    Historical Provider, MD  isosorbide-hydrALAZINE (BIDIL) 20-37.5 MG per tablet Take 1 tablet by mouth 2 (two) times daily. 12/16/13   Debbe Odea,  MD  PARoxetine (PAXIL) 20 MG tablet Take 20 mg by mouth daily.    Historical Provider, MD   Current Facility-Administered Medications  Medication Dose Route Frequency Provider Last Rate Last Dose  . piperacillin-tazobactam (ZOSYN) IVPB 2.25 g  2.25 g Intravenous 4 times per day Norva Riffle, [facility]      . vancomycin (VANCOCIN) IVPB 1000 mg/200 mL premix  1,000 mg Intravenous Once Norva Riffle, [facility]       Current Outpatient Prescriptions  Medication Sig Dispense Refill  . acetaminophen (TYLENOL) 325 MG tablet Take 2 tablets (650 mg total) by mouth every 6 (six) hours as needed for mild pain, fever or headache.      Marland Kitchen aspirin 325 MG tablet Take 325 mg by mouth every evening.      Marland Kitchen atorvastatin (LIPITOR) 20 MG tablet Take 20 mg by mouth at bedtime.      Marland Kitchen b complex-vitamin c-folic acid (NEPHRO-VITE) 0.8 MG TABS Take 0.8 mg by mouth at bedtime.      . carvedilol (COREG) 3.125 MG tablet Take 1 tablet (3.125 mg total) by mouth 2 (two) times daily with a meal.  30 tablet  0  . HYDROcodone-acetaminophen (NORCO/VICODIN) 5-325 MG per tablet Take 1 tablet by mouth every 6 (six) hours as needed for moderate pain or severe pain.  120 tablet  0  . hydrOXYzine (ATARAX/VISTARIL) 10 MG tablet Take 10 mg by mouth at bedtime as needed. For sleep      . isosorbide-hydrALAZINE (BIDIL) 20-37.5 MG per tablet Take 1 tablet by mouth 2 (two) times daily.  30 tablet  0  . PARoxetine (PAXIL) 20 MG tablet Take 20 mg by mouth daily.       Labs: Basic Metabolic Panel:  Recent Labs Lab 01/17/14 0940  NA 138  K 3.4*  CL 92*  CO2 28  GLUCOSE 121*  BUN 48*  CREATININE 7.11*  CALCIUM 9.1  PHOS 3.6   Liver Function Tests:  Recent Labs Lab 01/17/14 0940  AST 21  ALT 8  ALKPHOS 94  BILITOT 0.9  PROT 7.4  ALBUMIN 2.1*   CBC:  Recent Labs Lab 01/17/14 0940  WBC 14.4*  NEUTROABS 11.8*  HGB 7.2*  HCT 23.6*  MCV 83.7  PLT 423*   Cardiac Enzymes:  Recent Labs Lab 01/17/14 0800  TROPONINI  0.33*   Studies/Results: Ct Head Wo Contrast  01/17/2014   CLINICAL DATA:  Altered mental status.  Weakness.  Dialysis patient.  EXAM: CT HEAD WITHOUT CONTRAST  TECHNIQUE: Contiguous axial images were obtained from the base of the skull through the vertex without intravenous contrast.  COMPARISON:  12/13/2013.  FINDINGS: Stable cerebral atrophy, ventriculomegaly and periventricular white matter disease. Remote left occipital infarct with encephalomalacia ex vacuo dilatation of the occipital horn  of the left lateral ventricle. No extra-axial fluid collections are identified. No CT findings for acute hemispheric infarction or intracranial hemorrhage. No mass lesions. The brainstem and cerebellum are grossly normal and stable.  Stable dense vascular calcifications. The bony structures are intact. No fracture or bone lesion. The paranasal sinuses and mastoid air cells are clear. The globes are intact.  IMPRESSION: Stable atrophy, ventriculomegaly and periventricular white matter disease.  Remote left occipital infarct.  No acute intracranial findings or mass lesions.   Electronically Signed   By: Kalman Jewels M.D.   On: 01/17/2014 08:28   Dg Chest Port 1 View  01/17/2014   CLINICAL DATA:  Weakness.  History of end-stage renal disease.  EXAM: PORTABLE CHEST - 1 VIEW  COMPARISON:  12/20/2013  FINDINGS: There is stable mild cardiac enlargement. Lungs show evidence of mild interstitial edema. There is no evidence of airspace consolidation, pneumothorax, nodule or pleural fluid.  IMPRESSION: Cardiomegaly and mild interstitial edema.   Electronically Signed   By: Aletta Edouard M.D.   On: 01/17/2014 09:41    ROS: Patient unable to participate in ROS secondary to altered mental status.   Physical Exam: Filed Vitals:   01/17/14 0748 01/17/14 0749 01/17/14 0945 01/17/14 1141  BP: 142/77  137/74   Pulse: 96  94   Temp: 98.3 F (36.8 C)   98.6 F (37 C)  TempSrc: Oral   Rectal  Resp: 18  21   SpO2: 90% 95%  95%      General:  Well developed, well nourished, in no acute distress. Head: Normocephalic, atraumatic, sclera non-icteric, mucus membranes are moist Neck: Supple. JVD not elevated. Lungs: Faint crackles bilaterally. No wheezes/ rales. Speech slightly winded on 2L 02 via [place] Heart: RRR with S1 S2. No murmurs, rubs, or gallops appreciated. Abdomen: Soft, non-tender, non-distended with normoactive bowel sounds. No rebound/guarding. No obvious abdominal masses. M-S:  Strength and tone appear normal for age. Lower extremities: Evidence of old calciphylaxis wounds to bilat LEs, malodorous wound to LLE wrapped/ dressing clean/intact. Trace edema Neuro: Alert, partially oriented to person. Psych:  Tangential speech Dialysis Access: Lt AVG + bruit  Dialysis Orders: Center: [facility]  on MWF . EDW 86kg HD Bath 2K/2.25Ca  Time 4hr Heparin 9800. Access L AVG 450/A1.5    Hectorol 2 mcg IV/HD Aranesp 25 q Wed (8/5)   Units IV/HD  Venofer  0  Recent labs- Hgb 8.8, Tsat 18%, Ferr 1986, P 3.4, PTH 647  Assessment/Plan: 1. Altered mental status - Uremia vs sepsis or both. No HD since 8/5. CT head neg. +Leukocytosis. Blood and wound cultures pending. On IV Vanc/Zosyn. Plan HD today. Further work-up per primary. 2. Recurrent calciphylaxis/Non-healing LLE wound - Cultures pending. Previously with home visits for wound care several times per week. On abx.  3. ESRD -  MWF, missed yesterday. K+ 3.4. Use 2K bath as he is getting blood and does not need added Ca++ at this time. 4. Hypertension/volume  - SBPs 130s. Crackles on exam. Under edw. Challenge on HD. 5. Anemia  - Hgb 7.2. Previously 8.3 outpatient. Tsat 18% with Ferritin 1986, likely secondary to LLE wounds. Rec'd Aranesp 25 mcg on 8/5. Will increase here and transfuse 2 units PRBCs on HD. Start Fe load. 6. Metabolic bone disease -  Ca 9.1 (10.6 corrected). Phos controlled. Last PTH 647. Hold Vit D for hypercalcemia. Use low Ca bath as K+ permits.  He is s/p  failed parathyroidectomy.  7. Malnutrition - Renal diet, multivit,  supplements 8. DM  Sonnie Alamo, PA-C [facility] Pager [phone] 01/17/2014, 12:16 PM  I have seen and examined this patient and agree with plan Sonnie Alamo.  78yo BM who was just DC'd from NH  The other day and missed HD yest.  Found at home confused.  Suspect infection from calciphylactic wound on Lt leg.  Cultures done and empiric AB given.  Plan HD today and will give 2 unite PRBC's. Shondrika Hoque T,MD 01/17/2014 2:05 PM

## 2014-01-18 LAB — GLUCOSE, CAPILLARY
Glucose-Capillary: 109 mg/dL — ABNORMAL HIGH (ref 70–99)
Glucose-Capillary: 149 mg/dL — ABNORMAL HIGH (ref 70–99)
Glucose-Capillary: 140 mg/dL — ABNORMAL HIGH (ref 70–99)

## 2014-01-18 LAB — BASIC METABOLIC PANEL
Anion gap: 15 (ref 5–15)
BUN: 17 mg/dL (ref 6–23)
CHLORIDE: 95 meq/L — AB (ref 96–112)
CO2: 26 mEq/L (ref 19–32)
Calcium: 8.3 mg/dL — ABNORMAL LOW (ref 8.4–10.5)
Creatinine, Ser: 3.29 mg/dL — ABNORMAL HIGH (ref 0.50–1.35)
GFR calc Af Amer: 19 mL/min — ABNORMAL LOW (ref >90)
GFR calc non Af Amer: 17 mL/min — ABNORMAL LOW (ref >90)
GLUCOSE: 109 mg/dL — AB (ref 70–99)
Potassium: 4.1 mEq/L (ref 3.7–5.3)
Sodium: 136 mEq/L — ABNORMAL LOW (ref 137–147)

## 2014-01-18 LAB — TYPE AND SCREEN
ABO/RH(D): B POS
ANTIBODY SCREEN: NEGATIVE
UNIT DIVISION: 0
Unit division: 0

## 2014-01-18 LAB — CBC
HEMATOCRIT: 24.9 % — AB (ref 39.0–52.0)
Hemoglobin: 7.9 g/dL — ABNORMAL LOW (ref 13.0–17.0)
MCH: 26.6 pg (ref 26.0–34.0)
MCHC: 31.7 g/dL (ref 30.0–36.0)
MCV: 83.8 fL (ref 78.0–100.0)
PLATELETS: 371 10*3/uL (ref 150–400)
RBC: 2.97 MIL/uL — ABNORMAL LOW (ref 4.22–5.81)
RDW: 16.5 % — AB (ref 11.5–15.5)
WBC: 12 10*3/uL — AB (ref 4.0–10.5)

## 2014-01-18 MED ORDER — VANCOMYCIN HCL IN DEXTROSE 1-5 GM/200ML-% IV SOLN
1000.0000 mg | INTRAVENOUS | Status: DC
  Administered 2014-01-19 – 2014-01-21 (×3): 1000 mg via INTRAVENOUS
  Filled 2014-01-18 (×3): qty 200

## 2014-01-18 MED ORDER — COLLAGENASE 250 UNIT/GM EX OINT
TOPICAL_OINTMENT | Freq: Every day | CUTANEOUS | Status: DC
  Administered 2014-01-18 – 2014-01-20 (×3): via TOPICAL
  Administered 2014-01-21: 1 via TOPICAL
  Filled 2014-01-18: qty 30

## 2014-01-18 NOTE — Progress Notes (Signed)
Patient confused, disoriented x 3. Alert to self only. Patent has pulled out 2 IV access sites in the past 24 hours. Will continue to monitor. Velora Mediate

## 2014-01-18 NOTE — Progress Notes (Addendum)
[facility] Progress Note  Subjective:   Not eating much of breakfast tray. Pleasant and without complaints but little improvement in mental status.   Objective Filed Vitals:   01/17/14 1900 01/17/14 1919 01/17/14 2138 01/18/14 0612  BP: 131/69 124/59 120/74 112/63  Pulse: 92 89 91 87  Temp:  98.7 F (37.1 C) 97.6 F (36.4 C) 98.7 F (37.1 C)  TempSrc:  Oral Oral Oral  Resp: 22 25 22 20   Height:   5\' 5"  (1.651 m)   Weight:  87 kg (191 lb 12.8 oz) 87 kg (191 lb 12.8 oz)   SpO2:  100% 95% 95%   Physical Exam General: Elderly, pleasantly confused, oriented to person only Daughter has him up on the bedside commode and is shaving him. Alert, but having trouble completing thoughts, getting words out. Oriented to person  Heart: RRR Lungs: Grossly clear, breathing unlabored on 2L 02 via [place] Abdomen: Soft, NT, + BS Extremities: LLE wound wrapped/ dressing clean and intact. Trace edema and old calciphylaxis scars bilat  Upper end of wound visible and necrotic, with deep burrowing ulcerations below that and foul smell Dialysis Access: Lt AVG + bruit  Dialysis Orders: Center: [facility] on MWF .  EDW 86kg HD Bath 2K/2.25Ca Time 4hr Heparin 9800. Access L AVG 450/A1.5  Hectorol 2 mcg IV/HD Aranesp 25 q Wed (8/5) Units IV/HD Venofer 0  Recent labs- Hgb 8.8, Tsat 18%, Ferr 1986, P 3.4, PTH 647   Assessment/Plan: 1. Altered mental status - Uremia vs early sepsis. CT head neg. +Leukocytosis. Blood and wound cultures pending. On IV Vanc/Zosyn. Little improvement  s/p HD yesterday. I don't think uremia  - more likely toxic from his leg wounds which are extensive, foul smelling 2. Recurrent calciphylaxis/Non-healing LLE wound - Cultures pending. On abx. WOC consulted per primary. I wonder if he shouldn't just proceed to amputation.   3. ESRD - MWF, missed Friday. HD yesterday off schedule. Resume MWF HD tomorrow.   4. Hypertension/volume - SBPs 110s-120s. On low dose Coreg. Crackles  resolving. Challenge edw as tolerated.  5. Anemia - Hgb 7.9 > 7.2 s/p 2 units PRBCs on 8/8. Previously 8.3 outpatient. Tsat 18% with Ferritin 1986, likely secondary to LLE wounds. Rec'd Aranesp 25 mcg on 8/5. Dose increased here. Fe load initiated. Recheck CBC in a.m and transfuse PRN. 6. Metabolic bone disease - Ca 8.3 (~9.8 corrected). Vit D held yesterday for hypercalcemia.  He is s/p failed parathyroidectomy and last PTH was 647. Use low Ca bath as K+ permits. Follow phos and resume binder once eating better. 7. Malnutrition - Poor appetite. Renal diet, multivit, supplements. 8. DM   Collene Leyden. Cletus Gash, PA-C [facility] Pager [phone] 01/18/2014,10:13 AM  LOS: 1 day  I have seen and examined this patient and agree with plan and documentation of Doristine Counter [facility] with highlighted additions  Mental status nowhere near baseline, necrotic calciphylaxis wounds likely making him toxic.  Deep, foul smelling - wonder if there is really any chance of healing - would he be better off with primary amputation?  Other plans as outlined. Anyi Fels B,MD 01/18/2014 1:11 PM   Additional Objective Labs: Basic Metabolic Panel:  Recent Labs Lab 01/17/14 0940 01/18/14 0337  NA 138 136*  K 3.4* 4.1  CL 92* 95*  CO2 28 26  GLUCOSE 121* 109*  BUN 48* 17  CREATININE 7.11* 3.29*  CALCIUM 9.1 8.3*  PHOS 3.6  --    Liver Function Tests:  Recent Labs  Lab 01/17/14 0940  AST 21  ALT 8  ALKPHOS 94  BILITOT 0.9  PROT 7.4  ALBUMIN 2.1*   CBC:  Recent Labs Lab 01/17/14 0940 01/18/14 0337  WBC 14.4* 12.0*  NEUTROABS 11.8*  --   HGB 7.2* 7.9*  HCT 23.6* 24.9*  MCV 83.7 83.8  PLT 423* 371   Blood Culture    Component Value Date/Time   SDES LEG LEFT 01/17/2014 0805   SPECREQUEST NONE 01/17/2014 0805   CULT  Value: Culture reincubated for better growth Performed at [facility] 01/17/2014 0805   REPTSTATUS PENDING 01/17/2014 0805    Cardiac Enzymes:  Recent Labs Lab  01/17/14 0800  TROPONINI 0.33*   CBG:  Recent Labs Lab 01/17/14 1315 01/17/14 2133 01/18/14 0754  GLUCAP 133* 141* 109*    Studies/Results: Ct Head Wo Contrast  01/17/2014   CLINICAL DATA:  Altered mental status.  Weakness.  Dialysis patient.  EXAM: CT HEAD WITHOUT CONTRAST  TECHNIQUE: Contiguous axial images were obtained from the base of the skull through the vertex without intravenous contrast.  COMPARISON:  12/13/2013.  FINDINGS: Stable cerebral atrophy, ventriculomegaly and periventricular white matter disease. Remote left occipital infarct with encephalomalacia ex vacuo dilatation of the occipital horn of the left lateral ventricle. No extra-axial fluid collections are identified. No CT findings for acute hemispheric infarction or intracranial hemorrhage. No mass lesions. The brainstem and cerebellum are grossly normal and stable.  Stable dense vascular calcifications. The bony structures are intact. No fracture or bone lesion. The paranasal sinuses and mastoid air cells are clear. The globes are intact.  IMPRESSION: Stable atrophy, ventriculomegaly and periventricular white matter disease.  Remote left occipital infarct.  No acute intracranial findings or mass lesions.   Electronically Signed   By: Kalman Jewels M.D.   On: 01/17/2014 08:28   Dg Chest Port 1 View  01/17/2014   CLINICAL DATA:  Weakness.  History of end-stage renal disease.  EXAM: PORTABLE CHEST - 1 VIEW  COMPARISON:  12/20/2013  FINDINGS: There is stable mild cardiac enlargement. Lungs show evidence of mild interstitial edema. There is no evidence of airspace consolidation, pneumothorax, nodule or pleural fluid.  IMPRESSION: Cardiomegaly and mild interstitial edema.   Electronically Signed   By: Aletta Edouard M.D.   On: 01/17/2014 09:41   Medications:   . sodium chloride   Intravenous Once  . aspirin  325 mg Oral QPM  . atorvastatin  20 mg Oral QHS  . carvedilol  3.125 mg Oral BID WC  . [START ON 01/21/2014]  darbepoetin (ARANESP) injection - DIALYSIS  100 mcg Intravenous Q Wed-HD  . feeding supplement (RESOURCE BREEZE)  1 Container Oral BID BM  . ferric gluconate (FERRLECIT/NULECIT) IV  125 mg Intravenous Q M,W,F-HD  . heparin  5,000 Units Subcutaneous 3 times per day  . isosorbide-hydrALAZINE  1 tablet Oral BID  . multivitamin  1 tablet Oral QHS  . PARoxetine  20 mg Oral Daily  . piperacillin-tazobactam (ZOSYN)  IV  2.25 g Intravenous 4 times per day  . sodium chloride  3 mL Intravenous Q12H

## 2014-01-18 NOTE — Progress Notes (Signed)
Physical Therapy Evaluation Patient Details Name: REAL CONA MRN: [id number] DOB: 07-08-1935 Today's Date: 01/18/2014   History of Present Illness  Pt missed a dialysis appointment, which prompted the dialysis center to call EMS. Pt admitted with an altered mental status and a painful calciphylactic ulcer on anterior LLE.   Clinical Impression  Admitted with above. Presents with decreased functional mobility, decreased willingness to move OOB, and some confusion/communication difficulties. Pt will benefit from PT to improve functional mobility and educate pt on importance of mobility. At this time, SPT recommends d/c to SNF.  Pt's family was not present to discuss d/c plans nor was pt's home situation able to be assessed thoroughly in pt's current mood/mental state. Pt stated he didn't understand why he should get out of bed, despite SPT describing the benefits of mobility.     Follow Up Recommendations SNF    Equipment Recommendations       Recommendations for Other Services       Precautions / Restrictions Precautions Precautions: None Restrictions Weight Bearing Restrictions: No      Mobility  Bed Mobility Overal bed mobility: +2 for physical assistance;Needs Assistance Bed Mobility: Rolling;Supine to Sit Rolling: +2 for physical assistance; mod assist Rolling comments: Pt required one hand to pull his upper body to one side and a second set of hands to provide trunk and LE rotation. Required cues for body positioning.    Supine to sit: +2 for physical assistance;HOB elevated;max assist (potentially due to pt's disinterest in sitting)     General bed mobility comments: To pull to sitting, pt required both anterior assist and posterior support. Pt used SPT's hands to pull himself up and a second set of hands to keep him from laying/falling back to supine. This may be less of a balance or strength issue and more likely due to pt's will to stay in bed. From sit to supine,  pt required no assistance except to reposition himself in the bed.   Transfers  comments: unable to assess due to pt's reluctance to get OOB                  Ambulation/Gait                Stairs            Wheelchair Mobility    Modified Rankin (Stroke Patients Only)       Balance                                             Pertinent Vitals/Pain Pain Assessment:  (pt grimaced during all movements, no severity given) Pain Descriptors / Indicators: Burning;Grimacing Pain Intervention(s): Monitored during session;Repositioned;Limited activity within patient's tolerance  SpO2 at rest, Room Air: 90% SpO2 at rest, 3L/m O2: 95%    Home Living Family/patient expects to be discharged to:: Unsure, per chart review and previous admission, family is considering SNF.                      Prior Function Level of Independence: Independent with assistive device(s)               Hand Dominance   Dominant Hand: Right    Extremity/Trunk Assessment  Communication   Communication: Other (comment) (At times, difficult to understand/follow pt's thoughts)  Cognition Arousal/Alertness: Awake/alert (A&O x3) Behavior During Therapy: WFL for tasks assessed/performed Overall Cognitive Status: No family/caregiver present to determine baseline cognitive functioning                      General Comments General comments (skin integrity, edema, etc.): painful calciphilactic ulcer on LLE     Exercises        Assessment/Plan    PT Assessment Patient needs continued PT services  PT Diagnosis Altered mental status;Acute pain   PT Problem List Decreased range of motion;Decreased activity tolerance;Decreased cognition;Decreased safety awareness;Decreased skin integrity;Pain  PT Treatment Interventions DME instruction;Gait training;Functional mobility training;Therapeutic exercise;Therapeutic  activities;Patient/family education   PT Goals (Current goals can be found in the Care Plan section)      Frequency Min 3X/week   Barriers to discharge        Co-evaluation               End of Session Equipment Utilized During Treatment: Oxygen Activity Tolerance: Patient limited by pain;Other (comment) (activity limited by pt disposition. ) Patient left: in bed;with call bell/phone within reach Nurse Communication: Mobility status;Other (comment) (mental status)         Time: 1455-1530 PT Time Calculation (min): 35 min   Charges:         PT G Codes:          Shirleen Schirmer 01/18/2014, 4:45 PM   Shirleen Schirmer, SPT Acute Rehabilitation Services Office: [phone]

## 2014-01-18 NOTE — Consult Note (Signed)
WOC wound consult note Reason for Consult:Chronic calciphylaxis of LLE.  Last seen on 12/14/13.   Wound type: Inflammatory, systemic disease related Pressure Ulcer POA: No Measurement: Three areas:  The most proximal and the most distal are covered with firmly adherent stable eschar.  The largest area in the center has been significantly debrided by collagenase (Santyl) and or conservative sharp wound debridement since my last assessment. Entire area measures 26cm x 14cm.  Central ulcer measures 14cm x 14cm x 0.5cm. Proximal eschar measures 6cm x 4cm and distal eschar measures 6cm x 5.5cm Wound bed: Central wound is 20% necrotic and 80% red, moist tissue. Drainage (amount, consistency, odor) Moderate amount of serous exudate with faint odor consistent with necrotic tissue dissolving. Periwound: except for distal and proximal wounds which are as previously noted, periwound tissue is intact. Dressing procedure/placement/frequency:I will continue the treatment in place ie, enzymatic debriding via collagenase (Santyl) topped with saline dressings with daily changes. [place] nursing team will not follow, but will remain available to this patient, the nursing and medical teams.  Please re-consult if needed. Thanks, Maudie Flakes, MSN, RN, [place], [place], [place] [phone])

## 2014-01-18 NOTE — Progress Notes (Addendum)
Per central monitoring, pt has new onset of junctional rhythm. Patient is asymptomatic, without distress resting at this time. Raliegh Ip Schorr notified.12 lead EKG order placed. Will continue to monitor. Velora Mediate

## 2014-01-18 NOTE — Progress Notes (Signed)
Progress Note   Sean [name] TKZ:[id number] DOB: [birth year] DOA: 01/17/2014 PCP: Philis Fendt, MD   Brief Narrative:   Sean [name] is an 78 y.o. male with history of ESRD on MWF hemodialysis, CAD and MI 2011, hypertension, hyperlipidemia, chronic calciphylaxis and open wound LLE, secondary hyperparathyroidism, ischemic cardiomyopathy with EF on 12/15/13:45-50%, nuclear stress test 12/14/13 that did not show any reversible ischemia but showed EF of 25%, hyperlipidemia, DM 2, couple of recent hospitalizations & the last one 12/20/13-12/24/13 for leg pain, presents from home with altered mental status. He missed a dialysis on 8/7. In the ED, left leg wound appeared infected, tachycardic, WBC 14.4, potassium 3.4, creatinine 7.11, hemoglobin 7.2, troponin 0.33 BNP >70,000, CT head without acute findings and chest x-ray are with cardiomegaly and mild interstitial edema.   Assessment/Plan:   Principal Problem:   Calciphylaxis of left lower extremity with nonhealing ulcer, limited to breakdown of skin Active Problems:   HYPERLIPIDEMIA   End stage renal disease on dialysis   CEREBROVASCULAR ACCIDENT, HX OF   Diabetes mellitus due to underlying condition   CAD in native artery   Secondary hyperparathyroidism   Renovascular hypertension   Sepsis   Infected open wound   Acute encephalopathy    1. Chronic left leg wound from calciphylaxis, infected: Wound care consultation. Treat empirically with IV vancomycin and Zosyn. Wound care consultation pending. Will consult plastic surgery on 8/10 for evaluation. 2. Early sepsis, present on admission: Secondary to problem #1. Management as above. Hemodynamically stable. Sepsis physiology resolved. 3. Acute encephalopathy: CT head without acute findings. Secondary to uremia from missed dialysis versus sepsis complicating underlying dementia. No significant change in mental status after dialysis on admission. Monitor. 4. ESRD on MWF  hemodialysis: Nephrology followup appreciated. Had dialysis 8/8. 5. History of hyperlipidemia: continue statins 6. History of CAD/cardiomyopathy/mild acute systolic CHF from volume overload: No reported chest pains. Minimally elevated troponin-however decreasing compared to recent admission. Recent stress test without reversible ischemia and patient declined. Continue aspirin, carvedilol and Bidil. Hemodialysis for volume management. 7. Type II DM: Not on medications at home. Reasonably controlled off medications in the hospital. 8. Secondary hyperparathyroidism: Management per nephrology. 9. Hypertension: Controlled. Continue carvedilol.     DVT Prophylaxis: Heparin  Code Status: Full. Family Communication: Discussed with patient's daughter Ms. Erling Cruz on 8/9 Disposition Plan: SNF when medically stable.   IV Access:    Peripheral IV   Procedures and diagnostic studies:    Hemodialysis   Medical Consultants:    Nephrology   Other Consultants:    None.   Anti-Infectives:    IV vancomycin 8/8 >  IV Zosyn 8/8 >.  Subjective:    Sean [name] is pleasantly confused. Denies chest pain or dyspnea. As per daughter, no significant improvement in mental status.  Objective:    Filed Vitals:   01/17/14 1919 01/17/14 2138 01/18/14 0612 01/18/14 1020  BP: 124/59 120/74 112/63 115/67  Pulse: 89 91 87 84  Temp: 98.7 F (37.1 C) 97.6 F (36.4 C) 98.7 F (37.1 C) 97.7 F (36.5 C)  TempSrc: Oral Oral Oral Oral  Resp: 25 22 20 21   Height:  5\' 5"  (1.651 m)    Weight: 87 kg (191 lb 12.8 oz) 87 kg (191 lb 12.8 oz)    SpO2: 100% 95% 95% 95%    Intake/Output Summary (Last 24 hours) at 01/18/14 1250 Last data filed at 01/18/14 0900  Gross per 24 hour  Intake  730 ml  Output   1330 ml  Net   -600 ml    Exam: Gen:  NAD. Pleasant elderly male lying comfortably in bed. Cardiovascular:  RRR, No M/R/G. Telemetry: Sinus rhythm with bundle branch morphology  and occasional PVCs. Respiratory:  Diminished breath sounds in the bases but otherwise clear to auscultation. No increased work of breathing. Gastrointestinal:  Abdomen soft, NT/ND, + BS CNS: Alert & Oriented only to self. No focal deficits. Extremities:  No C/E/C. Large open wound occupying most of left anterior leg. Upper half of this wound is clean with pink granulation tissue. Lower half has sloughy base, foul-smelling and visible tendons. Extensive scarring of right leg without open wounds.    Data Reviewed:    Labs: Basic Metabolic Panel:  Recent Labs Lab 01/17/14 0940 01/18/14 0337  NA 138 136*  K 3.4* 4.1  CL 92* 95*  CO2 28 26  GLUCOSE 121* 109*  BUN 48* 17  CREATININE 7.11* 3.29*  CALCIUM 9.1 8.3*  PHOS 3.6  --    GFR Estimated Creatinine Clearance: 19.4 ml/min (by C-G formula based on Cr of 3.29). Liver Function Tests:  Recent Labs Lab 01/17/14 0940  AST 21  ALT 8  ALKPHOS 94  BILITOT 0.9  PROT 7.4  ALBUMIN 2.1*   No results found for this basename: LIPASE, AMYLASE,  in the last 168 hours No results found for this basename: AMMONIA,  in the last 168 hours Coagulation profile No results found for this basename: INR, PROTIME,  in the last 168 hours  CBC:  Recent Labs Lab 01/17/14 0940 01/18/14 0337  WBC 14.4* 12.0*  NEUTROABS 11.8*  --   HGB 7.2* 7.9*  HCT 23.6* 24.9*  MCV 83.7 83.8  PLT 423* 371   Cardiac Enzymes:  Recent Labs Lab 01/17/14 0800  TROPONINI 0.33*   BNP (last 3 results)  Recent Labs  12/13/13 1408 01/17/14 0800  PROBNP >70000.0* >70000.0*   CBG:  Recent Labs Lab 01/17/14 1315 01/17/14 2133 01/18/14 0754 01/18/14 1205  GLUCAP 133* 141* 109* 149*   D-Dimer: No results found for this basename: DDIMER,  in the last 72 hours Hgb A1c: No results found for this basename: HGBA1C,  in the last 72 hours Lipid Profile: No results found for this basename: CHOL, HDL, LDLCALC, TRIG, CHOLHDL, LDLDIRECT,  in the last 72  hours Thyroid function studies: No results found for this basename: TSH, T4TOTAL, FREET3, T3FREE, THYROIDAB,  in the last 72 hours Anemia work up: No results found for this basename: VITAMINB12, FOLATE, FERRITIN, TIBC, IRON, RETICCTPCT,  in the last 72 hours Sepsis Labs:  Recent Labs Lab 01/17/14 0940 01/17/14 1008 01/18/14 0337  WBC 14.4*  --  12.0*  LATICACIDVEN  --  1.25  --    Microbiology Recent Results (from the past 240 hour(s))  WOUND CULTURE     Status: None   Collection Time    01/17/14  8:05 AM      Result Value Ref Range Status   Specimen Description LEG LEFT   Final   Special Requests NONE   Final   Gram Stain PENDING   Incomplete   Culture     Final   Value: Culture reincubated for better growth     Performed at [facility]   Report Status PENDING   Incomplete  MRSA PCR SCREENING     Status: None   Collection Time    01/17/14  9:33 PM      Result  Value Ref Range Status   MRSA by PCR NEGATIVE  NEGATIVE Final   Comment:            The GeneXpert MRSA Assay (FDA     approved for NASAL specimens     only), is one component of a     comprehensive MRSA colonization     surveillance program. It is not     intended to diagnose MRSA     infection nor to guide or     monitor treatment for     MRSA infections.     Medications:   . sodium chloride   Intravenous Once  . aspirin  325 mg Oral QPM  . atorvastatin  20 mg Oral QHS  . carvedilol  3.125 mg Oral BID WC  . [START ON 01/21/2014] darbepoetin (ARANESP) injection - DIALYSIS  100 mcg Intravenous Q Wed-HD  . feeding supplement (RESOURCE BREEZE)  1 Container Oral BID BM  . ferric gluconate (FERRLECIT/NULECIT) IV  125 mg Intravenous Q M,W,F-HD  . heparin  5,000 Units Subcutaneous 3 times per day  . isosorbide-hydrALAZINE  1 tablet Oral BID  . multivitamin  1 tablet Oral QHS  . PARoxetine  20 mg Oral Daily  . piperacillin-tazobactam (ZOSYN)  IV  2.25 g Intravenous 4 times per day  . sodium chloride   3 mL Intravenous Q12H  . [START ON 01/19/2014] vancomycin  1,000 mg Intravenous Q M,W,F-HD   Continuous Infusions:    Time spent: 40 minutes   LOS: 1 day   Kiondra Caicedo, MD, FACP, FHM. [facility] Pager [phone]  If 7PM-7AM, please contact night-coverage [email or web address] Password [email or web address] 01/18/2014, 12:50 PM     **Disclaimer: This note was dictated with voice recognition software. Similar sounding words can inadvertently be transcribed and this note may contain transcription errors which may not have been corrected upon publication of note.**

## 2014-01-19 ENCOUNTER — Encounter (HOSPITAL_COMMUNITY): Payer: Self-pay | Admitting: Plastic Surgery

## 2014-01-19 DIAGNOSIS — L97909 Non-pressure chronic ulcer of unspecified part of unspecified lower leg with unspecified severity: Secondary | ICD-10-CM

## 2014-01-19 LAB — CBC
HCT: 28.4 % — ABNORMAL LOW (ref 39.0–52.0)
HEMOGLOBIN: 8.9 g/dL — AB (ref 13.0–17.0)
MCH: 26 pg (ref 26.0–34.0)
MCHC: 31.3 g/dL (ref 30.0–36.0)
MCV: 83 fL (ref 78.0–100.0)
Platelets: 357 10*3/uL (ref 150–400)
RBC: 3.42 MIL/uL — AB (ref 4.22–5.81)
RDW: 16.8 % — ABNORMAL HIGH (ref 11.5–15.5)
WBC: 12.4 10*3/uL — AB (ref 4.0–10.5)

## 2014-01-19 LAB — RENAL FUNCTION PANEL
Albumin: 1.9 g/dL — ABNORMAL LOW (ref 3.5–5.2)
Anion gap: 16 — ABNORMAL HIGH (ref 5–15)
BUN: 28 mg/dL — ABNORMAL HIGH (ref 6–23)
CHLORIDE: 93 meq/L — AB (ref 96–112)
CO2: 26 meq/L (ref 19–32)
Calcium: 8.8 mg/dL (ref 8.4–10.5)
Creatinine, Ser: 5.45 mg/dL — ABNORMAL HIGH (ref 0.50–1.35)
GFR calc non Af Amer: 9 mL/min — ABNORMAL LOW (ref >90)
GFR, EST AFRICAN AMERICAN: 11 mL/min — AB (ref >90)
GLUCOSE: 205 mg/dL — AB (ref 70–99)
Phosphorus: 2.7 mg/dL (ref 2.3–4.6)
Potassium: 3.7 mEq/L (ref 3.7–5.3)
SODIUM: 135 meq/L — AB (ref 137–147)

## 2014-01-19 LAB — GLUCOSE, CAPILLARY
GLUCOSE-CAPILLARY: 112 mg/dL — AB (ref 70–99)
GLUCOSE-CAPILLARY: 129 mg/dL — AB (ref 70–99)
Glucose-Capillary: 137 mg/dL — ABNORMAL HIGH (ref 70–99)

## 2014-01-19 MED ORDER — PIPERACILLIN-TAZOBACTAM IN DEX 2-0.25 GM/50ML IV SOLN
2.2500 g | Freq: Three times a day (TID) | INTRAVENOUS | Status: DC
  Administered 2014-01-19 – 2014-01-21 (×6): 2.25 g via INTRAVENOUS
  Filled 2014-01-19 (×10): qty 50

## 2014-01-19 MED ORDER — SILVER SULFADIAZINE 1 % EX CREA
TOPICAL_CREAM | Freq: Every day | CUTANEOUS | Status: DC
  Administered 2014-01-20: 1 via TOPICAL
  Administered 2014-01-21: 10:00:00 via TOPICAL
  Filled 2014-01-19: qty 85

## 2014-01-19 MED ORDER — SODIUM CHLORIDE 0.9 % IV SOLN
125.0000 mg | INTRAVENOUS | Status: DC
  Administered 2014-01-19 – 2014-01-21 (×2): 125 mg via INTRAVENOUS
  Filled 2014-01-19 (×3): qty 10

## 2014-01-19 NOTE — Evaluation (Signed)
Occupational Therapy Evaluation Patient Details Name: Sean [name] MRN: [id number] DOB: 1935-11-15 Today's Date: 01/19/2014    History of Present Illness Pt missed a dialysis appointment, which prompted the dialysis center to call EMS. Pt admitted with altered mental status and a painful calciphylactic ulcer on anterior LLE.   Clinical Impression   Pt was home from rehab at [facility] upon readmission.  Pt lives alone and does not have availability of 24 hour care.  Spoke to daughter, [name], by phone at length who has concerns about pt's baseline cognition and does not think pt is safe to return home.  Specifically, daughter observes memory deficits, confusion and difficulty with direction following.  Communicated this concern to MD notes.  Pt currently requires +2 assist for mobility.  His is dependent in bathing, dressing, and toileting.  Pt will require SNF upon discharge.   Follow Up Recommendations  SNF;Supervision/Assistance - 24 hour    Equipment Recommendations       Recommendations for Other Services       Precautions / Restrictions Precautions Precautions: Fall (contact) Restrictions Weight Bearing Restrictions: No      Mobility Bed Mobility Overal bed mobility: +2 for physical assistance;Needs Assistance Bed Mobility: Supine to Sit     Supine to sit: Mod assist;HOB elevated;+2 for physical assistance     General bed mobility comments: +2 for pulling to sit and support of his back  Transfers Overall transfer level: Needs assistance   Transfers: Stand Pivot Transfers   Stand pivot transfers: +2 physical assistance;Mod assist       General transfer comment: Pt required assistance in the power up to stand. Pt's pivot and stand to sit required assistance for balance and safety.     Balance                                            ADL Overall ADL's : Needs assistance/impaired Eating/Feeding: Set up;Sitting   Grooming: Oral  care;Wash/dry face;Wash/dry hands;Set up;Total assistance;Sitting (set up for hands/face, total assist to clean dentures)   Upper Body Bathing: Minimal assitance;Sitting   Lower Body Bathing: Total assistance;+2 for physical assistance;Sit to/from stand   Upper Body Dressing : Minimal assistance;Sitting   Lower Body Dressing: Total assistance;+2 for physical assistance;Sit to/from stand               Functional mobility during ADLs: +2 for physical assistance;Moderate assistance       Vision                     Perception     Praxis      Pertinent Vitals/Pain Pain Assessment: 0-10 Pain Score:  (pt did not rate) Pain Location: L LE with movement Pain Descriptors / Indicators: Aching Pain Intervention(s): Repositioned;Premedicated before session     Hand Dominance Right   Extremity/Trunk Assessment Upper Extremity Assessment Upper Extremity Assessment: Overall WFL for tasks assessed   Lower Extremity Assessment Lower Extremity Assessment: Defer to PT evaluation       Communication Communication Communication: Receptive difficulties (some difficulty with direction following, requires physical )   Cognition Arousal/Alertness: Awake/alert Behavior During Therapy: WFL for tasks assessed/performed Overall Cognitive Status: Impaired/Different from baseline Area of Impairment: Memory;Following commands     Memory: Decreased short-term memory Following Commands: Follows one step commands with increased time (and physical cue)  General Comments: Daughter states pt has poor memory and difficulty understanding directions prior to admission.   She stated SNF staff interpreted this as non compliance, but she felt that pt just didn't understand. Daughter [name]) is concerned about pt's decline in cognition and that he may have dementia.   General Comments       Exercises       Shoulder Instructions      Home Living Family/patient expects to be  discharged to:: Skilled nursing facility                                 Additional Comments: daughter [name] wants SNF placement      Prior Functioning/Environment Level of Independence: Needs assistance  Gait / Transfers Assistance Needed: reports ambulating with RW ADL's / Homemaking Assistance Needed: Assist needed for bathing, homemaking and cooking.   Comments: Pt home from SNF 2 days prior to readmission.    OT Diagnosis: Generalized weakness;Acute pain;Cognitive deficits   OT Problem List: Decreased strength;Decreased activity tolerance;Impaired balance (sitting and/or standing);Impaired vision/perception;Decreased cognition;Decreased safety awareness;Decreased knowledge of use of DME or AE;Pain;Obesity;Increased edema   OT Treatment/Interventions: Self-care/ADL training;DME and/or AE instruction;Therapeutic activities;Patient/family education;Balance training    OT Goals(Current goals can be found in the care plan section) Acute Rehab OT Goals Patient Stated Goal: Pain free L LE OT Goal Formulation: With patient Time For Goal Achievement: 02/02/14 Potential to Achieve Goals: Fair  OT Frequency: Min 2X/week   Barriers to D/C: Decreased caregiver support          Co-evaluation PT/OT/SLP Co-Evaluation/Treatment: Yes Reason for Co-Treatment: For patient/therapist safety PT goals addressed during session: Mobility/safety with mobility OT goals addressed during session: ADL's and self-care      End of Session    Activity Tolerance: Patient limited by pain Patient left: in chair;with call bell/phone within reach;with chair alarm set   Time: 0350-0938 OT Time Calculation (min): 54 min Charges:  OT General Charges $OT Visit: 1 Procedure OT Evaluation $Initial OT Evaluation Tier I: 1 Procedure OT Treatments $Self Care/Home Management : 8-22 mins G-Codes:    Malka So 01/19/2014, 12:59 PM [phone]

## 2014-01-19 NOTE — Progress Notes (Signed)
Progress Note   Sean [name] KDT:[id number] DOB: 1935-11-20 DOA: 01/17/2014 PCP: Philis Fendt, MD   Brief Narrative:   Sean [name] is an 78 y.o. male with history of ESRD on MWF hemodialysis, CAD and MI 2011, hypertension, hyperlipidemia, chronic calciphylaxis and open wound LLE, secondary hyperparathyroidism, ischemic cardiomyopathy with EF on 12/15/13:45-50%, nuclear stress test 12/14/13 that did not show any reversible ischemia but showed EF of 25%, hyperlipidemia, DM 2, couple of recent hospitalizations & the last one 12/20/13-12/24/13 for leg pain, presents from home with altered mental status. He missed a dialysis on 8/7. In the ED, left leg wound appeared infected, tachycardic, WBC 14.4, potassium 3.4, creatinine 7.11, hemoglobin 7.2, troponin 0.33 BNP >70,000, CT head without acute findings and chest x-ray are with cardiomegaly and mild interstitial edema.   Assessment/Plan:   Principal Problem:   Calciphylaxis of left lower extremity with nonhealing ulcer, limited to breakdown of skin Active Problems:   HYPERLIPIDEMIA   End stage renal disease on dialysis   CEREBROVASCULAR ACCIDENT, HX OF   Diabetes mellitus due to underlying condition   CAD in native artery   Secondary hyperparathyroidism   Renovascular hypertension   Sepsis   Infected open wound   Acute encephalopathy    1. Chronic left leg wound from calciphylaxis, infected: Wound care consultation. Treat empirically with IV vancomycin and Zosyn. Consulted plastic surgery-input appreciated-recommends local care. 2. Early sepsis, present on admission: Secondary to problem #1. Management as above. Hemodynamically stable. Sepsis physiology resolved. 3. Acute encephalopathy: CT head without acute findings. Secondary to uremia from missed dialysis versus sepsis complicating underlying dementia. Mental status has improved-probably at baseline now 4. ESRD on MWF hemodialysis: Nephrology followup appreciated. Had  dialysis 8/8. 5. History of hyperlipidemia: continue statins 6. History of CAD/cardiomyopathy/mild acute systolic CHF from volume overload: No reported chest pains. Minimally elevated troponin-however decreasing compared to recent admission. Recent stress test without reversible ischemia and patient declined. Continue aspirin, carvedilol and Bidil. Hemodialysis for volume management. 7. Type II DM: Not on medications at home. Reasonably controlled off medications in the hospital. 8. Secondary hyperparathyroidism: Management per nephrology. 9. Hypertension: Controlled. Continue carvedilol.     DVT Prophylaxis: Heparin  Code Status: Full. Family Communication: Discussed with patient's daughter Ms. Erling Cruz on 8/10 Disposition Plan: SNF when medically stable.   IV Access:    Peripheral IV   Procedures and diagnostic studies:    Hemodialysis   Medical Consultants:    Nephrology  Plastic surgery    Other Consultants:    None.   Anti-Infectives:    IV vancomycin 8/8 >  IV Zosyn 8/8 >.  Subjective:    [name] was much more coherent this morning and complained of left leg pain.  Objective:    Filed Vitals:   01/19/14 1555 01/19/14 1630 01/19/14 1700 01/19/14 1730  BP: 124/74 150/72 139/70 145/81  Pulse: 95 99 100 104  Temp:      TempSrc:      Resp:      Height:      Weight:      SpO2:       temperature 98.58F, respiratory rate 20 per minute and oxygen saturation 95%.  Intake/Output Summary (Last 24 hours) at 01/19/14 1748 Last data filed at 01/19/14 1407  Gross per 24 hour  Intake    663 ml  Output      0 ml  Net    663 ml    Exam: Gen:  NAD. Pleasant elderly male lying comfortably in bed. Cardiovascular:  RRR, No M/R/G. Telemetry: Sinus rhythm-DC'd 8/10. Respiratory:  Diminished breath sounds in the bases but otherwise clear to auscultation. No increased work of breathing. Gastrointestinal:  Abdomen soft, NT/ND, + BS CNS: Alert  & Oriented to person place and partly to time No focal deficits. Extremities:  No C/E/C. Large open wound occupying most of left anterior leg. Upper half of this wound is clean with pink granulation tissue. Lower half has sloughy base, foul-smelling and visible tendons. Extensive scarring of right leg without open wounds.    Data Reviewed:    Labs: Basic Metabolic Panel:  Recent Labs Lab 01/17/14 0940 01/18/14 0337 01/19/14 1533  NA 138 136* 135*  K 3.4* 4.1 3.7  CL 92* 95* 93*  CO2 28 26 26   GLUCOSE 121* 109* 205*  BUN 48* 17 28*  CREATININE 7.11* 3.29* 5.45*  CALCIUM 9.1 8.3* 8.8  PHOS 3.6  --  2.7   GFR Estimated Creatinine Clearance: 11.7 ml/min (by C-G formula based on Cr of 5.45). Liver Function Tests:  Recent Labs Lab 01/17/14 0940 01/19/14 1533  AST 21  --   ALT 8  --   ALKPHOS 94  --   BILITOT 0.9  --   PROT 7.4  --   ALBUMIN 2.1* 1.9*   No results found for this basename: LIPASE, AMYLASE,  in the last 168 hours No results found for this basename: AMMONIA,  in the last 168 hours Coagulation profile No results found for this basename: INR, PROTIME,  in the last 168 hours  CBC:  Recent Labs Lab 01/17/14 0940 01/18/14 0337 01/19/14 1533  WBC 14.4* 12.0* 12.4*  NEUTROABS 11.8*  --   --   HGB 7.2* 7.9* 8.9*  HCT 23.6* 24.9* 28.4*  MCV 83.7 83.8 83.0  PLT 423* 371 357   Cardiac Enzymes:  Recent Labs Lab 01/17/14 0800  TROPONINI 0.33*   BNP (last 3 results)  Recent Labs  12/13/13 1408 01/17/14 0800  PROBNP >70000.0* >70000.0*   CBG:  Recent Labs Lab 01/18/14 0754 01/18/14 1205 01/18/14 1655 01/19/14 0803 01/19/14 1145  GLUCAP 109* 149* 140* 112* 137*   D-Dimer: No results found for this basename: DDIMER,  in the last 72 hours Hgb A1c: No results found for this basename: HGBA1C,  in the last 72 hours Lipid Profile: No results found for this basename: CHOL, HDL, LDLCALC, TRIG, CHOLHDL, LDLDIRECT,  in the last 72 hours Thyroid  function studies: No results found for this basename: TSH, T4TOTAL, FREET3, T3FREE, THYROIDAB,  in the last 72 hours Anemia work up: No results found for this basename: VITAMINB12, FOLATE, FERRITIN, TIBC, IRON, RETICCTPCT,  in the last 72 hours Sepsis Labs:  Recent Labs Lab 01/17/14 0940 01/17/14 1008 01/18/14 0337 01/19/14 1533  WBC 14.4*  --  12.0* 12.4*  LATICACIDVEN  --  1.25  --   --    Microbiology Recent Results (from the past 240 hour(s))  WOUND CULTURE     Status: None   Collection Time    01/17/14  8:05 AM      Result Value Ref Range Status   Specimen Description LEG LEFT   Final   Special Requests NONE   Final   Gram Stain PENDING   Incomplete   Culture     Final   Value: Culture reincubated for better growth     Performed at [facility]   Report Status PENDING   Incomplete  CULTURE, BLOOD (ROUTINE X 2)     Status: None   Collection Time    01/17/14  9:30 AM      Result Value Ref Range Status   Specimen Description BLOOD RIGHT ARM   Final   Special Requests     Final   Value: BOTTLES DRAWN AEROBIC AND ANAEROBIC 10CC BLUE, 5CC RED   Culture  Setup Time     Final   Value: 01/17/2014 20:29     Performed at [facility]   Culture     Final   Value:        BLOOD CULTURE RECEIVED NO GROWTH TO DATE CULTURE WILL BE HELD FOR 5 DAYS BEFORE ISSUING A FINAL NEGATIVE REPORT     Performed at [facility]   Report Status PENDING   Incomplete  CULTURE, BLOOD (ROUTINE X 2)     Status: None   Collection Time    01/17/14  9:40 AM      Result Value Ref Range Status   Specimen Description BLOOD RIGHT HAND   Final   Special Requests BOTTLES DRAWN AEROBIC ONLY [facility]   Final   Culture  Setup Time     Final   Value: 01/17/2014 20:29     Performed at [facility]   Culture     Final   Value:        BLOOD CULTURE RECEIVED NO GROWTH TO DATE CULTURE WILL BE HELD FOR 5 DAYS BEFORE ISSUING A FINAL NEGATIVE REPORT     Performed at [facility]    Report Status PENDING   Incomplete  MRSA PCR SCREENING     Status: None   Collection Time    01/17/14  9:33 PM      Result Value Ref Range Status   MRSA by PCR NEGATIVE  NEGATIVE Final   Comment:            The GeneXpert MRSA Assay (FDA     approved for NASAL specimens     only), is one component of a     comprehensive MRSA colonization     surveillance program. It is not     intended to diagnose MRSA     infection nor to guide or     monitor treatment for     MRSA infections.     Medications:   . sodium chloride   Intravenous Once  . aspirin  325 mg Oral QPM  . atorvastatin  20 mg Oral QHS  . carvedilol  3.125 mg Oral BID WC  . collagenase   Topical Daily  . [START ON 01/21/2014] darbepoetin (ARANESP) injection - DIALYSIS  100 mcg Intravenous Q Wed-HD  . feeding supplement (RESOURCE BREEZE)  1 Container Oral BID BM  . ferric gluconate (FERRLECIT/NULECIT) IV  125 mg Intravenous Q M,W,F-HD  . heparin  5,000 Units Subcutaneous 3 times per day  . isosorbide-hydrALAZINE  1 tablet Oral BID  . multivitamin  1 tablet Oral QHS  . PARoxetine  20 mg Oral Daily  . piperacillin-tazobactam (ZOSYN)  IV  2.25 g Intravenous 3 times per day  . silver sulfADIAZINE   Topical Daily  . sodium chloride  3 mL Intravenous Q12H  . vancomycin  1,000 mg Intravenous Q M,W,F-HD   Continuous Infusions:    Time spent: 30 minutes   LOS: 2 days   Concepcion Gillott, MD, FACP, FHM. [facility] Pager [phone]  If 7PM-7AM, please contact night-coverage [email or web address] Password [facility] 01/19/2014, 5:48  PM     **Disclaimer: This note was dictated with voice recognition software. Similar sounding words can inadvertently be transcribed and this note may contain transcription errors which may not have been corrected upon publication of note.**

## 2014-01-19 NOTE — Consult Note (Signed)
Reason for Consult:leg ulcers Referring Physician: IM  Sean [name] is an 78 y.o. male.  HPI: The patient is a 78 yrs old bm here for treatment of multiple medical conditions including ESRD, DM and lower extremity ulcers.  He has been dealing with the ulcers for years.  They seem to have gotten worse in the past few weeks.  They are located on the anterior portion of his lower leg.  Distal area has an escar.  The proximal area has an open area that involves a portion of fat.  They do not appear to be infected.  He has some pain with the area.  Past Medical History  Diagnosis Date  . Hyperparathyroidism   . Arthritis   . Hyperlipidemia   . Hypertension   . Neuromuscular disorder   . Stroke   . GERD (gastroesophageal reflux disease)   . Headache(784.0)     occasional  migraine   . Heart attack 2011; 1990's  . Type II diabetes mellitus   . Peripheral neuropathy   . Anxiety   . Depression   . Dialysis patient     M, W, F; [place] (09/19/11)  . Chronic kidney disease     [facility] MWF    Past Surgical History  Procedure Laterality Date  . Colon surgery  2011    colon resection   . Parathyroidectomy  2011  . Capd insertion  09/12/2011    Procedure: LAPAROSCOPIC INSERTION CONTINUOUS AMBULATORY PERITONEAL DIALYSIS  (CAPD) CATHETER;  Surgeon: Edward Jolly, MD;  Location: [place];  Service: General;  Laterality: N/A;  . Sp av dialysis shunt access existing *l*  2003  . Dg av dialysis shunt intro ndl*r* or      "not working" (09/19/11)  . Capd removal  03/12/2012    Procedure: CONTINUOUS AMBULATORY PERITONEAL DIALYSIS  (CAPD) CATHETER REMOVAL;  Surgeon: Edward Jolly, MD;  Location: [facility];  Service: General;  Laterality: N/A;    Family History  Problem Relation Age of Onset  . Cancer Sister     breast  . Cancer Brother     lung  . Anesthesia problems Neg Hx   . Hypotension Neg Hx   . Malignant hyperthermia Neg Hx   . Pseudochol deficiency Neg Hx     Social  History:  reports that he quit smoking about 37 years ago. His smoking use included Cigarettes. He has a 1.75 pack-year smoking history. He has never used smokeless tobacco. He reports that he drinks alcohol. He reports that he does not use illicit drugs.  Allergies:  Allergies  Allergen Reactions  . Benazepril Hcl Hives, Itching and Rash    flareups on patient head    Medications: I have reviewed the patient's current medications.  Results for orders placed during the hospital encounter of 01/17/14 (from the past 48 hour(s))  MRSA PCR SCREENING     Status: None   Collection Time    01/17/14  9:33 PM      Result Value Ref Range   MRSA by PCR NEGATIVE  NEGATIVE   Comment:            The GeneXpert MRSA Assay (FDA     approved for NASAL specimens     only), is one component of a     comprehensive MRSA colonization     surveillance program. It is not     intended to diagnose MRSA     infection nor to guide or  monitor treatment for     MRSA infections.  GLUCOSE, CAPILLARY     Status: Abnormal   Collection Time    01/17/14  9:33 PM      Result Value Ref Range   Glucose-Capillary 141 (*) 70 - 99 mg/dL  BASIC METABOLIC PANEL     Status: Abnormal   Collection Time    01/18/14  3:37 AM      Result Value Ref Range   Sodium 136 (*) 137 - 147 mEq/L   Potassium 4.1  3.7 - 5.3 mEq/L   Comment: DELTA CHECK NOTED   Chloride 95 (*) 96 - 112 mEq/L   CO2 26  19 - 32 mEq/L   Glucose, Bld 109 (*) 70 - 99 mg/dL   BUN 17  6 - 23 mg/dL   Comment: DELTA CHECK NOTED   Creatinine, Ser 3.29 (*) 0.50 - 1.35 mg/dL   Comment: DELTA CHECK NOTED   Calcium 8.3 (*) 8.4 - 10.5 mg/dL   GFR calc non Af Amer 17 (*) >90 mL/min   GFR calc Af Amer 19 (*) >90 mL/min   Comment: (NOTE)     The eGFR has been calculated using the CKD EPI equation.     This calculation has not been validated in all clinical situations.     eGFR's persistently <90 mL/min signify possible Chronic Kidney     Disease.   Anion  gap 15  5 - 15  CBC     Status: Abnormal   Collection Time    01/18/14  3:37 AM      Result Value Ref Range   WBC 12.0 (*) 4.0 - 10.5 K/uL   RBC 2.97 (*) 4.22 - 5.81 MIL/uL   Hemoglobin 7.9 (*) 13.0 - 17.0 g/dL   HCT 24.9 (*) 39.0 - 52.0 %   MCV 83.8  78.0 - 100.0 fL   MCH 26.6  26.0 - 34.0 pg   MCHC 31.7  30.0 - 36.0 g/dL   RDW 16.5 (*) 11.5 - 15.5 %   Platelets 371  150 - 400 K/uL  GLUCOSE, CAPILLARY     Status: Abnormal   Collection Time    01/18/14  7:54 AM      Result Value Ref Range   Glucose-Capillary 109 (*) 70 - 99 mg/dL  GLUCOSE, CAPILLARY     Status: Abnormal   Collection Time    01/18/14 12:05 PM      Result Value Ref Range   Glucose-Capillary 149 (*) 70 - 99 mg/dL  GLUCOSE, CAPILLARY     Status: Abnormal   Collection Time    01/18/14  4:55 PM      Result Value Ref Range   Glucose-Capillary 140 (*) 70 - 99 mg/dL  GLUCOSE, CAPILLARY     Status: Abnormal   Collection Time    01/19/14  8:03 AM      Result Value Ref Range   Glucose-Capillary 112 (*) 70 - 99 mg/dL  GLUCOSE, CAPILLARY     Status: Abnormal   Collection Time    01/19/14 11:45 AM      Result Value Ref Range   Glucose-Capillary 137 (*) 70 - 99 mg/dL    No results found.  Review of Systems  Constitutional: Negative.   HENT: Negative.   Eyes: Negative.   Respiratory: Negative.   Cardiovascular: Negative.   Gastrointestinal: Negative.   Genitourinary: Negative.   Skin: Negative.   Neurological: Negative.   Psychiatric/Behavioral: Negative.    Blood pressure  145/74, pulse 90, temperature 97.6 F (36.4 C), temperature source Oral, resp. rate 20, height _0  (1.651 m), weight 85.8 kg (189 lb 2.5 oz), SpO2 96.00%. Physical Exam  Constitutional: He is oriented to person, place, and time. He appears well-developed.  HENT:  Head: Normocephalic and atraumatic.  Eyes: Conjunctivae and EOM are normal. Pupils are equal, round, and reactive to light.  Respiratory: Effort normal.  GI: Soft.    Musculoskeletal:       Legs: Neurological: He is alert and oriented to person, place, and time.  Psychiatric: He has a normal mood and affect. His behavior is normal. Judgment and thought content normal.    Assessment/Plan: Recommend local care, check a prealbumin.  Will consider Acell placement to the proximal area and continue with silvadene to the escar. Increase protein as able with his kidney disease.  SANGER,CLAIRE 01/19/2014, 2:13 PM

## 2014-01-19 NOTE — Progress Notes (Signed)
Medicare Important Message given?  YES (If response is "NO", the following Medicare IM given date fields will be blank) Date Medicare IM given:  01/19/2014 Medicare IM given by:  Willard Farquharson 

## 2014-01-19 NOTE — Progress Notes (Signed)
Reviewed and agree  Letzy Gullickson, PT  Acute Rehabilitation Services Pager [phone] Office [phone]  

## 2014-01-19 NOTE — Progress Notes (Signed)
Physical Therapy Treatment Patient Details Name: Sean [name] MRN: [id number] DOB: 09-02-1935 Today's Date: 01/19/2014    History of Present Illness Pt missed a dialysis appointment, which prompted the dialysis center to call EMS. Pt admitted with altered mental status and a painful calciphylactic ulcer on anterior LLE.    PT Comments    Today's session focused on assessing pt's transfer from bed to recliner as well as obtaining more home and history information. SPT co-treated with OT. Pt was able to transfer from bed to recliner. SPT feels there is a comprehension barrier which limits pt's ability to participate in PT. Pt stated today he would look forward to walking at the next session, but that he'd like to take it bit by bit. Pt seems aware of his limitations presently.   Pt was able to give more information on his home and history in today's session. He had some confusion over dates and specifics, but was able to tell SPT/OT about his previous admission at [facility] and how he came to miss his dialysis appointment which led to his current admission. SPT continues to recommend d/c to SNF for both short term and possibly long term care.   Follow Up Recommendations  SNF     Equipment Recommendations       Recommendations for Other Services       Precautions / Restrictions Precautions Precautions: None Restrictions Weight Bearing Restrictions: No    Mobility  Bed Mobility Overal bed mobility: +2 for physical assistance;Needs Assistance Bed Mobility: Supine to Sit     Supine to sit: Mod assist;HOB elevated;+2 for physical assistance     General bed mobility comments: Pt required +2 assistance to pull his trunk to sit as well as support of his back in sitting. When he understood our goal to move to recliner, he required less assistance to keep him from laying back down.   Transfers Overall transfer level: Needs assistance   Transfers: Stand Pivot Transfers    Stand pivot transfers: +2 physical assistance;Mod assist       General transfer comment: Pt required assistance in the power up to stand. Pt's pivot and stand to sit required assistance for balance and safety.   Ambulation/Gait                 Stairs            Wheelchair Mobility    Modified Rankin (Stroke Patients Only)       Balance  Pt's sitting balance was difficult to assess. He required back support and hand held support to stay upright, but when it became clear to pt to transfer to the recliner, he required less assistance to maintain position.                                  Cognition Arousal/Alertness: Awake/alert Behavior During Therapy: WFL for tasks assessed/performed Overall Cognitive Status: No family/caregiver present to determine baseline cognitive functioning                      Exercises      General Comments General comments (skin integrity, edema, etc.): painful caciphilactic ulcer on LLE      Pertinent Vitals/Pain Pain Assessment:  Pt states he has pain in his L leg due to ulcer, no number given; compared to last session, pt grimaced less with movement, indicting less pain. Pain Intervention(s): Monitored during  session;Premedicated before session;Repositioned;Limited activity within patient's tolerance    Home Living  lives alone.                    Prior Function  Previously used walker. Pt stated prior function described his admission at [facility] rather than at home.           PT Goals (current goals can now be found in the care plan section) Progress towards PT goals: Progressing toward goals    Frequency  Min 3X/week    PT Plan Current plan remains appropriate    Co-evaluation PT/OT/SLP Co-Evaluation/Treatment: Yes Reason for Co-Treatment: For patient/therapist safety;Complexity of the patient's impairments (multi-system involvement) PT goals addressed during session:  Mobility/safety with mobility OT goals addressed during session: ADL's and self-care     End of Session Equipment Utilized During Treatment: Oxygen   Patient left: in chair;with call bell/phone within reach     Time: 7425-9563 PT Time Calculation (min): 24 min  Charges:                       G CodesShirleen Schirmer 22-Jan-2014, 11:50 AM  Shirleen Schirmer, [place] Office: [phone]

## 2014-01-19 NOTE — Progress Notes (Signed)
Sean [name] Progress Note  Assessment/Plan:Uremia  1. Altered mental status -  CT head neg. +Leukocytosis. Blood and wound cultures pending. On IV Vanc/Zosyn. MS improved - baseline - likely infection related 2. Recurrent calciphylaxis/Non-healing LLE wound - Cultures pending. On abx. WOC consulted per primary. Plastics to see as well. 3. ESRD - MWF, missed Friday. HD yesterday off schedule. Resume MWF HD tthis pm 4. Hypertension/volume - SBPs 110s-120s. On low dose Coreg. Crackles resolving. Challenge edw as tolerated - will have a lower edw at discharge.  5. Anemia - Hgb 7.9 > 7.2 s/p 2 units PRBCs on 8/8. Previously 8.3 outpatient. Tsat 18% with Ferritin 1986, likely ESA resistance secondary to LLE wounds. Rec'd Aranesp 25 mcg on 8/5. Dose increased to 100. Fe load initiated - will give 4 dsoes; ferritin ^ likely inflammation. Recheck CBC in a.m and transfuse PRN. 6. Metabolic bone disease - Ca 8.3 (~9.8 corrected). Vit D held yesterday for hypercalcemia. He is s/p failed parathyroidectomy and last PTH was 647. Use low Ca bath as K+ permits. Follow phos and resume binder once eating better. 7. Malnutrition - Poor appetite. Renal diet, multivit, supplements. 8. DM - BS controlled; per primary  Sean Jacobson, PA-C [place] 01/19/2014,10:50 AM  LOS: 2 days   Pt seen, examined and agree w A/P as above.  Sean Splinter MD pager [phone]    cell [phone] 01/19/2014, 2:58 PM    Subjective:   Left leg pain; appetite poor  Objective Filed Vitals:   01/18/14 1817 01/18/14 2118 01/19/14 0628 01/19/14 1000  BP: 91/56 136/56 147/72 145/74  Pulse: 89 85 88 90  Temp: 99.8 F (37.7 C) 98.6 F (37 C) 98.5 F (36.9 C) 97.6 F (36.4 C)  TempSrc: Oral Oral Oral Oral  Resp: 21 18 20 20   Height:      Weight:  85.8 kg (189 lb 2.5 oz)    SpO2: 94% 96% 97% 96%   Physical Exam General: NAD alert and conversant sitting up in chair Heart:  RRR Lungs: no overt crackles Abdomen: obese soft Extremities: right LE scars - tr LE edema; left LE wrapped Dialysis Access:left AVGG + bruit  Dialysis Orders: Center: [facility] on MWF .  EDW 86kg HD Bath 2K/2.25Ca Time 4hr Heparin 9800. Access L AVG 450/A1.5  Hectorol 2 mcg IV/HD Aranesp 25 q Wed (8/5) Units IV/HD Venofer 0  Recent labs- Hgb 8.8, Tsat 18%, Ferr 1986, P 3.4, PTH 647   Additional Objective Labs: Basic Metabolic Panel:  Recent Labs Lab 01/17/14 0940 01/18/14 0337  NA 138 136*  K 3.4* 4.1  CL 92* 95*  CO2 28 26  GLUCOSE 121* 109*  BUN 48* 17  CREATININE 7.11* 3.29*  CALCIUM 9.1 8.3*  PHOS 3.6  --    Liver Function Tests:  Recent Labs Lab 01/17/14 0940  AST 21  ALT 8  ALKPHOS 94  BILITOT 0.9  PROT 7.4  ALBUMIN 2.1*  CBC:  Recent Labs Lab 01/17/14 0940 01/18/14 0337  WBC 14.4* 12.0*  NEUTROABS 11.8*  --   HGB 7.2* 7.9*  HCT 23.6* 24.9*  MCV 83.7 83.8  PLT 423* 371   Blood Culture    Component Value Date/Time   SDES BLOOD RIGHT HAND 01/17/2014 0940   SPECREQUEST BOTTLES DRAWN AEROBIC ONLY [place] 01/17/2014 0940   CULT  Value:        BLOOD CULTURE RECEIVED NO GROWTH TO DATE CULTURE WILL BE HELD FOR 5 DAYS BEFORE ISSUING A  FINAL NEGATIVE REPORT Performed at [facility] 01/17/2014 0940   REPTSTATUS PENDING 01/17/2014 0940    Cardiac Enzymes:  Recent Labs Lab 01/17/14 0800  TROPONINI 0.33*   CBG:  Recent Labs Lab 01/17/14 2133 01/18/14 0754 01/18/14 1205 01/18/14 1655 01/19/14 0803  GLUCAP 141* 109* 149* 140* 112*  Medications:   . sodium chloride   Intravenous Once  . aspirin  325 mg Oral QPM  . atorvastatin  20 mg Oral QHS  . carvedilol  3.125 mg Oral BID WC  . collagenase   Topical Daily  . [START ON 01/21/2014] darbepoetin (ARANESP) injection - DIALYSIS  100 mcg Intravenous Q Wed-HD  . feeding supplement (RESOURCE BREEZE)  1 Container Oral BID BM  . ferric gluconate (FERRLECIT/NULECIT) IV  125 mg Intravenous Q M,W,F-HD  .  heparin  5,000 Units Subcutaneous 3 times per day  . isosorbide-hydrALAZINE  1 tablet Oral BID  . multivitamin  1 tablet Oral QHS  . PARoxetine  20 mg Oral Daily  . piperacillin-tazobactam (ZOSYN)  IV  2.25 g Intravenous 3 times per day  . sodium chloride  3 mL Intravenous Q12H  . vancomycin  1,000 mg Intravenous Q M,W,F-HD

## 2014-01-19 NOTE — Progress Notes (Signed)
Clinical Social Work Department BRIEF PSYCHOSOCIAL ASSESSMENT 01/19/2014  Patient:  Sean [name], Sean [name]     Account Number:  [id number]     Admit date:  01/17/2014  Clinical Social Worker:  Valda Lamb  Date/Time:  01/19/2014 10:47 AM  Referred by:  Physician  Date Referred:  01/19/2014 Referred for  SNF Placement   Other Referral:   Interview type:  Patient Other interview type:    PSYCHOSOCIAL DATA Living Status:  ALONE Admitted from facility:   Level of care:   Primary support name:  Sean, [name] Daughter [phone] Primary support relationship to patient:  SPOUSE Degree of support available:   Pt's children appear actively involved in pt care however are not able to provide 24hr care to pt at discharge.    CURRENT CONCERNS Current Concerns  Post-Acute Placement   Other Concerns:    SOCIAL WORK ASSESSMENT / PLAN Covering CSW informed that pt will need SNF placement at discharge.    CSW informed that pt presents with AMS and pt's daughter could be contacted. CSW contacted pt's daughter Sean [name] Daughter [phone] who informed CSW that pt was at [facility] for 1 week prior to dc home. Pt was found AMS at home and brought to the hospital. Daughter states that she and siblings do not feel that pt is able to care for himself at home any longer and will therefore eventually need LT care.    CSW provided daughter with education regarding how many days Medicare will cover and the need for [place] to cover [place] care. CSW encouraged daughter to begin the Medicaid process ASAP. Daughter agreeable and requested that CSW speak with pt son to provide information. CSW agreeable to meet with son once he arrives to the hospital.    Daughter requested a SNF search be done for [facility] as it is closest to siblings.    CSW observed that pt's pasarr will expire 01/23/14. CSW to resubmit for a new pasarr if pt is still in the hospital.   Assessment/plan  status:  Psychosocial Support/Ongoing Assessment of Needs Other assessment/ plan:   Information/referral to community resources:   CSW to provide a SNF list.    CSW provided daughter with information regarding applying for [place].    PATIENT'S/FAMILY'S RESPONSE TO PLAN OF CARE: Pt presents with AMS. CSW completed assessment with daughter who is agreeable to SNF placement at discharge.    CSW to provide bed offers when available.       Clinical Social Work Department BRIEF PSYCHOSOCIAL ASSESSMENT 01/19/2014  Patient:  Sean [name], Sean [name]     Account Number:  [id number]     Admit date:  01/17/2014  Clinical Social Worker:  Valda Lamb  Date/Time:  01/19/2014 10:47 AM  Referred by:  Physician  Date Referred:  01/19/2014 Referred for  SNF Placement   Other Referral:   Interview type:  Patient Other interview type:    PSYCHOSOCIAL DATA Living Status:  ALONE Admitted from facility:   Level of care:   Primary support name:  Sean, [name] Daughter [phone] Primary support relationship to patient:  SPOUSE Degree of support available:   Pt's children appear actively involved in pt care however are not able to provide 24hr care to pt at discharge.    CURRENT CONCERNS Current Concerns  Post-Acute Placement   Other Concerns:    SOCIAL WORK ASSESSMENT / PLAN Covering CSW informed that pt will need SNF placement at discharge.    CSW informed that pt presents  with AMS and pt's daughter could be contacted. CSW contacted pt's daughter Sean [name] Daughter [phone] who informed CSW that pt was at [facility] for 1 week prior to dc home. Pt was found AMS at home and brought to the hospital. Daughter states that she and siblings do not feel that pt is able to care for himself at home any longer and will therefore eventually need LT care.    CSW provided daughter with education regarding how many days Medicare will cover and the need for [place] to cover  LT care. CSW encouraged daughter to begin the Medicaid process ASAP. Daughter agreeable and requested that CSW speak with pt son to provide information. CSW agreeable to meet with son once he arrives to the hospital.    Daughter requested a SNF search be done for [facility] as it is closest to siblings.    CSW observed that pt's pasarr will expire 01/23/14. CSW to resubmit for a new pasarr if pt is still in the hospital.   Assessment/plan status:  Psychosocial Support/Ongoing Assessment of Needs Other assessment/ plan:   Information/referral to community resources:   CSW to provide a SNF list.    CSW provided daughter with information regarding applying for [place].    PATIENT'S/FAMILY'S RESPONSE TO PLAN OF CARE: Pt presents with AMS. CSW completed assessment with daughter who is agreeable to SNF placement at discharge.    CSW to provide bed offers when available.       Hunt Oris, MSW, [place]

## 2014-01-19 NOTE — Progress Notes (Signed)
Reviewed and agree  Johnathin Vanderschaaf, PT  Acute Rehabilitation Services Pager [phone] Office [phone]  

## 2014-01-19 NOTE — Progress Notes (Signed)
ANTIBIOTIC CONSULT NOTE - FOLLOW UP  Pharmacy Consult for vancomycin and zosyn Indication: rule out sepsis; LLE wound   Allergies  Allergen Reactions  . Benazepril Hcl Hives, Itching and Rash    flareups on patient head    Patient Measurements: Height: 5\' 5"  (165.1 cm) Weight: 189 lb 2.5 oz (85.8 kg) IBW/kg (Calculated) : 61.5   Vital Signs: Temp: 97.6 F (36.4 C) (08/10 1000) Temp src: Oral (08/10 1000) BP: 145/74 mmHg (08/10 1000) Pulse Rate: 90 (08/10 1000) Intake/Output from previous day: 08/09 0701 - 08/10 0700 In: 490 [P.O.:240; IV Piggyback:250] Out: -  Intake/Output from this shift: Total I/O In: 240 [P.O.:240] Out: -   Labs:  Recent Labs  01/17/14 0940 01/18/14 0337  WBC 14.4* 12.0*  HGB 7.2* 7.9*  PLT 423* 371  CREATININE 7.11* 3.29*   Estimated Creatinine Clearance: 19.2 ml/min (by C-G formula based on Cr of 3.29). No results found for this basename: VANCOTROUGH, VANCOPEAK, VANCORANDOM, [place], [place], GENTRANDOM, [place], TOBRAPEAK, TOBRARND, AMIKACINPEAK, AMIKACINTROU, AMIKACIN,  in the last 72 hours   Microbiology: Recent Results (from the past 720 hour(s))  WOUND CULTURE     Status: None   Collection Time    01/17/14  8:05 AM      Result Value Ref Range Status   Specimen Description LEG LEFT   Final   Special Requests NONE   Final   Gram Stain PENDING   Incomplete   Culture     Final   Value: Culture reincubated for better growth     Performed at [facility]   Report Status PENDING   Incomplete  CULTURE, BLOOD (ROUTINE X 2)     Status: None   Collection Time    01/17/14  9:30 AM      Result Value Ref Range Status   Specimen Description BLOOD RIGHT ARM   Final   Special Requests     Final   Value: BOTTLES DRAWN AEROBIC AND ANAEROBIC 10CC BLUE, 5CC RED   Culture  Setup Time     Final   Value: 01/17/2014 20:29     Performed at [facility]   Culture     Final   Value:        BLOOD CULTURE RECEIVED NO GROWTH  TO DATE CULTURE WILL BE HELD FOR 5 DAYS BEFORE ISSUING A FINAL NEGATIVE REPORT     Performed at [facility]   Report Status PENDING   Incomplete  CULTURE, BLOOD (ROUTINE X 2)     Status: None   Collection Time    01/17/14  9:40 AM      Result Value Ref Range Status   Specimen Description BLOOD RIGHT HAND   Final   Special Requests BOTTLES DRAWN AEROBIC ONLY [facility]   Final   Culture  Setup Time     Final   Value: 01/17/2014 20:29     Performed at [facility]   Culture     Final   Value:        BLOOD CULTURE RECEIVED NO GROWTH TO DATE CULTURE WILL BE HELD FOR 5 DAYS BEFORE ISSUING A FINAL NEGATIVE REPORT     Performed at [facility]   Report Status PENDING   Incomplete  MRSA PCR SCREENING     Status: None   Collection Time    01/17/14  9:33 PM      Result Value Ref Range Status   MRSA by PCR NEGATIVE  NEGATIVE Final  Comment:            The GeneXpert MRSA Assay (FDA     approved for NASAL specimens     only), is one component of a     comprehensive MRSA colonization     surveillance program. It is not     intended to diagnose MRSA     infection nor to guide or     monitor treatment for     MRSA infections.    Anti-infectives   Start     Dose/Rate Route Frequency Ordered Stop   01/19/14 1400  piperacillin-tazobactam (ZOSYN) IVPB 2.25 g     2.25 g 100 mL/hr over 30 Minutes Intravenous 3 times per day 01/19/14 0847     01/19/14 1200  vancomycin (VANCOCIN) IVPB 1000 mg/200 mL premix     1,000 mg 200 mL/hr over 60 Minutes Intravenous Every M-W-F (Hemodialysis) 01/18/14 1215     01/17/14 1600  vancomycin (VANCOCIN) IVPB 1000 mg/200 mL premix     1,000 mg 200 mL/hr over 60 Minutes Intravenous Every T-Th-Sa (Hemodialysis) 01/17/14 1441 01/17/14 1901   01/17/14 1200  piperacillin-tazobactam (ZOSYN) IVPB 2.25 g  Status:  Discontinued     2.25 g 100 mL/hr over 30 Minutes Intravenous 4 times per day 01/17/14 0907 01/19/14 0847   01/17/14 1000  vancomycin  (VANCOCIN) IVPB 1000 mg/200 mL premix     1,000 mg 200 mL/hr over 60 Minutes Intravenous  Once 01/17/14 0907 01/19/14 0848   01/17/14 0815  piperacillin-tazobactam (ZOSYN) IVPB 3.375 g     3.375 g 100 mL/hr over 30 Minutes Intravenous  Once 01/17/14 0802 01/17/14 1017   01/17/14 0815  vancomycin (VANCOCIN) IVPB 1000 mg/200 mL premix     1,000 mg 200 mL/hr over 60 Minutes Intravenous  Once 01/17/14 0802 01/17/14 1231      Assessment: Patient is a 78 y.o M with ESRD on HD currently on vancomycin and zosyn for LLE wound infection and suspected sepsis. Plan for HD today. All cultures have been negative thus far. He remains afebrile with wbc elevated but trending down.  Vanc 8/8> Zosyn 8/8>  8/8 left leg Wound>> ngtd 8/8 Bcx x2>> ngtd   Goal of Therapy:  Pre-HD vancomycin level= 15-25  Plan:  1)Vanc 1gm IV Q HD MWF  2) changed Zosyn to 2.25gm IV q8h  Stark Aguinaga P 01/19/2014,1:12 PM

## 2014-01-19 NOTE — Progress Notes (Addendum)
Clinical Social Work Department CLINICAL SOCIAL WORK PLACEMENT NOTE 01/19/2014  Patient:  Sean [name], Sean [name]  Account Number:  [id number] Admit date:  01/17/2014  Clinical Social Worker:  Hunt Oris, Latanya Presser  Date/time:  01/19/2014 11:22 AM  Clinical Social Work is seeking post-discharge placement for this patient at the following level of care:   SKILLED NURSING   (*CSW will update this form in Epic as items are completed)   01/19/2014  Patient/family provided with [facility] Department of Clinical Social Work's list of facilities offering this level of care within the geographic area requested by the patient (or if unable, by the patient's family).  01/19/2014  Patient/family informed of their freedom to choose among providers that offer the needed level of care, that participate in Medicare, Medicaid or managed care program needed by the patient, have an available bed and are willing to accept the patient.  01/19/2014  Patient/family informed of [facility]' ownership interest in [facility], as well as of the fact that they are under no obligation to receive care at this facility.  PASARR submitted to EDS on exisiting but will expire 01/23/14 PASARR number received on   FL2 transmitted to all facilities in geographic area requested by pt/family on  01/19/2014 FL2 transmitted to all facilities within larger geographic area on   Patient informed that his/her managed care company has contracts with or will negotiate with  certain facilities, including the following:     Patient/family informed of bed offers received:  01/20/14 Patient chooses bed at [facility] Physician recommends and patient chooses bed at    Patient to be transferred to [facility] on  01/21/14 Patient to be transferred to facility by [facility] Patient and family notified of transfer on 01/21/14 Name of family member notified:  Echo AGREEABLE. DAUGHTER INFORMED CSW THAT SHE HAS BEEN IN  COMMUNICATION WITH PTS OTHER DAUGHTER AND SON.  The following physician request were entered in Epic:   Additional Comments:

## 2014-01-20 LAB — GLUCOSE, CAPILLARY
GLUCOSE-CAPILLARY: 197 mg/dL — AB (ref 70–99)
GLUCOSE-CAPILLARY: 158 mg/dL — AB (ref 70–99)
GLUCOSE-CAPILLARY: 144 mg/dL — AB (ref 70–99)
Glucose-Capillary: 176 mg/dL — ABNORMAL HIGH (ref 70–99)

## 2014-01-20 LAB — PREALBUMIN: Prealbumin: 6 mg/dL — ABNORMAL LOW (ref 17.0–34.0)

## 2014-01-20 MED ORDER — PRO-STAT SUGAR FREE PO LIQD
30.0000 mL | Freq: Three times a day (TID) | ORAL | Status: DC
  Administered 2014-01-20 – 2014-01-21 (×3): 30 mL via ORAL
  Filled 2014-01-20 (×3): qty 30

## 2014-01-20 NOTE — Progress Notes (Signed)
CSW left a message for pt son to provide bed offers. CSW spoke with daughter be phone who requested bed offers be sent to her via email. CSW informed daughter that pt could potentially be ready for discharge tomorrow. Daughter requested CSW search for placement in [facility]. CSW has contacted [facility] in [place]. CSW to assist with dc once pt is medically ready.  Hunt Oris, MSW, [place]

## 2014-01-20 NOTE — Progress Notes (Signed)
Physical Therapy Treatment Patient Details Name: Sean [name] MRN: [id number] DOB: Oct 16, 1935 Today's Date: 01/20/2014    History of Present Illness Pt missed a dialysis appointment, which prompted the dialysis center to call EMS. Pt admitted with altered mental status and a painful calciphylactic ulcer on anterior LLE.    PT Comments    Pt continues to progress towards goals despite his pain today. In this session, Pt was able to stand twice before pain became a limiting factor. Pt states his pain decreased some when he kept his weight off of his LLE. SPT continues to recommend d/c to SNF.    Follow Up Recommendations  SNF     Equipment Recommendations       Recommendations for Other Services       Precautions / Restrictions Precautions Precautions: Fall Restrictions Weight Bearing Restrictions: No    Mobility  Bed Mobility                  Transfers Overall transfer level: Needs assistance   Transfers: Sit to/from Stand Sit to Stand: Mod assist;+2 physical assistance Pt required assistance to power up from sitting. Once standing, min assist for balance. Pt also used rollator handles for stability in standing, likely due to pain in LLE. Pt was able to stand at max about 30 seconds before pain and fatigue forced him to sit.             Ambulation/Gait                 Stairs            Wheelchair Mobility    Modified Rankin (Stroke Patients Only)       Balance                                    Cognition Arousal/Alertness: Awake/alert Behavior During Therapy: WFL for tasks assessed/performed Overall Cognitive Status: Impaired/Different from baseline Area of Impairment: Memory;Following commands       Following Commands: Follows one step commands with increased time            Exercises      General Comments General comments (skin integrity, edema, etc.): pailful calciphilactic ulcer on LLE       Pertinent Vitals/Pain Pain Assessment: 0-10 Pain Score: 10-Worst pain ever Pain Location: L LE after standing Pain Intervention(s): Limited activity within patient's tolerance;Repositioned;Patient requesting pain meds-RN notified Comments: initially pt reported decreased pain from yesterday (no number given), but reported 10/10 pain after standing.    Home Living   Living Arrangements: Alone                  Prior Function            PT Goals (current goals can now be found in the care plan section) Acute Rehab PT Goals Patient Stated Goal: Pain free L LE Progress towards PT goals: Progressing toward goals     Frequency  Min 3X/week    PT Plan Current plan remains appropriate    Co-evaluation     PT goals addressed during session: Mobility/safety with mobility       End of Session   Activity Tolerance: Patient limited by pain Patient left: in chair;with call bell/phone within reach;with nursing/sitter in room     Time: 1326-1350 PT Time Calculation (min): 24 min  Charges:  G CodesShirleen Schirmer Feb 06, 2014, 3:10 PM  Shirleen Schirmer, SPT Acute Rehabilitation Services Office: [phone]

## 2014-01-20 NOTE — Progress Notes (Signed)
Late Entry for 01/18/14:  Patient was too confused and unable to follow the commands.  He was repeating the same sentence and was trying to get out of bed.  He needed close supervision, so moved to camera room.

## 2014-01-20 NOTE — Progress Notes (Signed)
Progress Note   Sean [name] HCW:[id number] DOB: 12-13-1935 DOA: 01/17/2014 PCP: Philis Fendt, MD   Brief Narrative:   Sean [name] is an 78 y.o. male with history of ESRD on MWF hemodialysis, CAD and MI 2011, hypertension, hyperlipidemia, chronic calciphylaxis and open wound LLE, secondary hyperparathyroidism, ischemic cardiomyopathy with EF on 12/15/13:45-50%, nuclear stress test 12/14/13 that did not show any reversible ischemia but showed EF of 25%, hyperlipidemia, DM 2, couple of recent hospitalizations & the last one 12/20/13-12/24/13 for leg pain, presents from home with altered mental status. He missed a dialysis on 8/7. In the ED, left leg wound appeared infected, tachycardic, WBC 14.4, potassium 3.4, creatinine 7.11, hemoglobin 7.2, troponin 0.33 BNP >70,000, CT head without acute findings and chest x-ray are with cardiomegaly and mild interstitial edema. Nephrology following- undergoing HD. MS back to baseline. Plastic surgery consulted and recommend local care. DC to SNF pending final wound Cx results to change to appropriate PO Abx and SNF availability- please see Nephro note 8/11 re change in HD site as OP.   Assessment/Plan:   Principal Problem:   Calciphylaxis of left lower extremity with nonhealing ulcer, limited to breakdown of skin Active Problems:   HYPERLIPIDEMIA   End stage renal disease on dialysis   CEREBROVASCULAR ACCIDENT, HX OF   Diabetes mellitus due to underlying condition   CAD in native artery   Secondary hyperparathyroidism   Renovascular hypertension   Sepsis   Infected open wound   Acute encephalopathy    1. Chronic left leg wound from calciphylaxis, infected: Wound care consultation 8/9 appreciated. Treat empirically with IV vancomycin and Zosyn- wound culture shows moderate staph aureus & gram neg rods- change to PO based on final sensitivity. Consulted plastic surgery- recommend local care. 2. Early sepsis, present on admission: Secondary  to problem #1. Management as above. Hemodynamically stable. Sepsis physiology resolved. 3. Acute encephalopathy: CT head without acute findings. Secondary to uremia from missed dialysis versus sepsis complicating underlying dementia. Mental status has improved-probably at baseline now 4. ESRD on MWF hemodialysis: Nephrology followup appreciated. Had dialysis 8/8. Please see 8/11 note re change to OP HD recommendations. 5. History of hyperlipidemia: continue statins 6. History of CAD/cardiomyopathy/mild acute systolic CHF from volume overload: No reported chest pains. Minimally elevated troponin-however decreasing compared to recent admission. Recent stress test without reversible ischemia and patient declined. Continue aspirin, carvedilol and Bidil. Hemodialysis for volume management. 7. Type II DM: Not on medications at home. Reasonably controlled off medications in the hospital. 8. Secondary hyperparathyroidism: Management per nephrology. 9. Hypertension: Controlled. Continue carvedilol.     DVT Prophylaxis: Heparin  Code Status: Full. Family Communication: Discussed with patient's daughter Ms. Erling Cruz on 8/10 Disposition Plan: SNF possibly 01/21/14.   IV Access:    Peripheral IV   Procedures and diagnostic studies:    Hemodialysis   Medical Consultants:    Nephrology  Plastic surgery    Other Consultants:    Wound Care Team.   Anti-Infectives:    IV vancomycin 8/8 >  IV Zosyn 8/8 >.  Subjective:    Sean [name] complains of constipation. Denies any other complaints.  Objective:    Filed Vitals:   01/19/14 2327 01/20/14 0558 01/20/14 0900 01/20/14 1206  BP:  134/82 95/58   Pulse:  95 89 90  Temp: 99.2 F (37.3 C) 98.3 F (36.8 C) 98.7 F (37.1 C)   TempSrc: Oral Oral Oral   Resp:  16 16   Height:  Weight:      SpO2:  93% 93% 95%    Intake/Output Summary (Last 24 hours) at 01/20/14 1217 Last data filed at 01/20/14 0900  Gross  per 24 hour  Intake   1343 ml  Output   2225 ml  Net   -882 ml    Exam: Gen:  NAD. Pleasant elderly male sitting up in bed eating breakfast this AM. Cardiovascular:  RRR, No M/R/G.  Respiratory:  clear to auscultation. No increased work of breathing. Gastrointestinal:  Abdomen soft, NT/ND, + BS CNS: Alert & Oriented to person place and partly to time No focal deficits. Extremities:  No C/E/C. Dressing over large left leg wound- C/D/I. Extensive scarring of right leg without open wounds.    Data Reviewed:    Labs: Basic Metabolic Panel:  Recent Labs Lab 01/17/14 0940 01/18/14 0337 01/19/14 1533  NA 138 136* 135*  K 3.4* 4.1 3.7  CL 92* 95* 93*  CO2 28 26 26   GLUCOSE 121* 109* 205*  BUN 48* 17 28*  CREATININE 7.11* 3.29* 5.45*  CALCIUM 9.1 8.3* 8.8  PHOS 3.6  --  2.7   GFR Estimated Creatinine Clearance: 11.5 ml/min (by C-G formula based on Cr of 5.45). Liver Function Tests:  Recent Labs Lab 01/17/14 0940 01/19/14 1533  AST 21  --   ALT 8  --   ALKPHOS 94  --   BILITOT 0.9  --   PROT 7.4  --   ALBUMIN 2.1* 1.9*   No results found for this basename: LIPASE, AMYLASE,  in the last 168 hours No results found for this basename: AMMONIA,  in the last 168 hours Coagulation profile No results found for this basename: INR, PROTIME,  in the last 168 hours  CBC:  Recent Labs Lab 01/17/14 0940 01/18/14 0337 01/19/14 1533  WBC 14.4* 12.0* 12.4*  NEUTROABS 11.8*  --   --   HGB 7.2* 7.9* 8.9*  HCT 23.6* 24.9* 28.4*  MCV 83.7 83.8 83.0  PLT 423* 371 357   Cardiac Enzymes:  Recent Labs Lab 01/17/14 0800  TROPONINI 0.33*   BNP (last 3 results)  Recent Labs  12/13/13 1408 01/17/14 0800  PROBNP >70000.0* >70000.0*   CBG:  Recent Labs Lab 01/19/14 0803 01/19/14 1145 01/19/14 2102 01/20/14 0827 01/20/14 1142  GLUCAP 112* 137* 129* 197* 158*   D-Dimer: No results found for this basename: DDIMER,  in the last 72 hours Hgb A1c: No results found  for this basename: HGBA1C,  in the last 72 hours Lipid Profile: No results found for this basename: CHOL, HDL, LDLCALC, TRIG, CHOLHDL, LDLDIRECT,  in the last 72 hours Thyroid function studies: No results found for this basename: TSH, T4TOTAL, FREET3, T3FREE, THYROIDAB,  in the last 72 hours Anemia work up: No results found for this basename: VITAMINB12, FOLATE, FERRITIN, TIBC, IRON, RETICCTPCT,  in the last 72 hours Sepsis Labs:  Recent Labs Lab 01/17/14 0940 01/17/14 1008 01/18/14 0337 01/19/14 1533  WBC 14.4*  --  12.0* 12.4*  LATICACIDVEN  --  1.25  --   --    Microbiology Recent Results (from the past 240 hour(s))  WOUND CULTURE     Status: None   Collection Time    01/17/14  8:05 AM      Result Value Ref Range Status   Specimen Description LEG LEFT   Final   Special Requests NONE   Final   Gram Stain     Final   Value: RARE SQUAMOUS  EPITHELIAL CELLS PRESENT     MODERATE WBC PRESENT, PREDOMINANTLY PMN     FEW GRAM POSITIVE COCCI IN CLUSTERS     MODERATE GRAM NEGATIVE RODS     RARE GRAM POSITIVE RODS   Culture     Final   Value: MODERATE STAPHYLOCOCCUS AUREUS     Note: RIFAMPIN AND GENTAMICIN SHOULD NOT BE USED AS SINGLE DRUGS FOR TREATMENT OF STAPH INFECTIONS.     MODERATE GRAM NEGATIVE RODS     Performed at [facility]   Report Status PENDING   Incomplete  CULTURE, BLOOD (ROUTINE X 2)     Status: None   Collection Time    01/17/14  9:30 AM      Result Value Ref Range Status   Specimen Description BLOOD RIGHT ARM   Final   Special Requests     Final   Value: BOTTLES DRAWN AEROBIC AND ANAEROBIC 10CC BLUE, 5CC RED   Culture  Setup Time     Final   Value: 01/17/2014 20:29     Performed at [facility]   Culture     Final   Value:        BLOOD CULTURE RECEIVED NO GROWTH TO DATE CULTURE WILL BE HELD FOR 5 DAYS BEFORE ISSUING A FINAL NEGATIVE REPORT     Performed at [facility]   Report Status PENDING   Incomplete  CULTURE, BLOOD (ROUTINE X  2)     Status: None   Collection Time    01/17/14  9:40 AM      Result Value Ref Range Status   Specimen Description BLOOD RIGHT HAND   Final   Special Requests BOTTLES DRAWN AEROBIC ONLY [facility]   Final   Culture  Setup Time     Final   Value: 01/17/2014 20:29     Performed at [facility]   Culture     Final   Value:        BLOOD CULTURE RECEIVED NO GROWTH TO DATE CULTURE WILL BE HELD FOR 5 DAYS BEFORE ISSUING A FINAL NEGATIVE REPORT     Performed at [facility]   Report Status PENDING   Incomplete  MRSA PCR SCREENING     Status: None   Collection Time    01/17/14  9:33 PM      Result Value Ref Range Status   MRSA by PCR NEGATIVE  NEGATIVE Final   Comment:            The GeneXpert MRSA Assay (FDA     approved for NASAL specimens     only), is one component of a     comprehensive MRSA colonization     surveillance program. It is not     intended to diagnose MRSA     infection nor to guide or     monitor treatment for     MRSA infections.     Medications:   . sodium chloride   Intravenous Once  . aspirin  325 mg Oral QPM  . atorvastatin  20 mg Oral QHS  . carvedilol  3.125 mg Oral BID WC  . collagenase   Topical Daily  . [START ON 01/21/2014] darbepoetin (ARANESP) injection - DIALYSIS  100 mcg Intravenous Q Wed-HD  . feeding supplement (PRO-STAT SUGAR FREE 64)  30 mL Oral TID WC  . feeding supplement (RESOURCE BREEZE)  1 Container Oral BID BM  . ferric gluconate (FERRLECIT/NULECIT) IV  125 mg Intravenous Q M,W,F-HD  .  heparin  5,000 Units Subcutaneous 3 times per day  . isosorbide-hydrALAZINE  1 tablet Oral BID  . multivitamin  1 tablet Oral QHS  . PARoxetine  20 mg Oral Daily  . piperacillin-tazobactam (ZOSYN)  IV  2.25 g Intravenous 3 times per day  . silver sulfADIAZINE   Topical Daily  . sodium chloride  3 mL Intravenous Q12H  . vancomycin  1,000 mg Intravenous Q M,W,F-HD   Continuous Infusions:    Time spent: 30 minutes   LOS: 3 days    Kaysie Michelini, MD, FACP, FHM. [facility] Pager [phone]  If 7PM-7AM, please contact night-coverage [email or web address] Password [email or web address] 01/20/2014, 12:17 PM     **Disclaimer: This note was dictated with voice recognition software. Similar sounding words can inadvertently be transcribed and this note may contain transcription errors which may not have been corrected upon publication of note.**

## 2014-01-20 NOTE — Progress Notes (Signed)
Reviewed and agree  Samayra Hebel, PT  Acute Rehabilitation Services Pager [phone] Office [phone]  

## 2014-01-20 NOTE — Progress Notes (Signed)
[facility] Progress Note  Assessment/Plan: 1. Altered mental status - CT head neg. +Leukocytosis. Blood and wound cultures pending. On IV Vanc/Zosyn. MS improved - baseline - likely infection related 2. Recurrent calciphylaxis/Non-healing LLE wound - Cultures pending. On abx. WOC consulted per primary. Plastics saw rec: local care 3. ESRD - MWF - HD tomorrow - no prob with Monday tmt. 4. Hypertension/volume - SBPs 110s-120s. On low dose Coreg. Improved UF 2.2 8/10 with post wt 84.2 Challenge edw as tolerated - will have a lower edw at discharge.  5. Anemia - Hgb 8.9 stable s/p 2 units PRBCs on 8/8. Previously 8.3 outpatient. Tsat 18% with Ferritin 1986, likely ESA resistance secondary to LLE wounds. Rec'd Aranesp 25 mcg on 8/5. Dose increased to 100. Fe load initiated - will give 4 dsoes; ferritin ^ likely inflammation. Recheck CBC in a.m and transfuse PRN. 6. Metabolic bone disease - Ca 8.3 (~9.8 corrected). Vit D held yesterday for hypercalcemia. He is s/p failed parathyroidectomy and last PTH was 647. Use low Ca bath as K+ permits. Follow phos and resume binder once eating better. 7. Malnutrition - Poor appetite. Renal diet, multivit, supplements.; discussed eating ^ pro foods" because it is good for healing; add prostat 8. DM - BS controlled; per primary 9. Disp - I see that daughter is interested in moving to another county. If that is the cause his dialysis care will need to be changed to another practice and dialysis facility before the move can be made.  [name], PA-C [place] 01/20/2014,11:34 AM  LOS: 3 days   Pt seen, examined and agree w A/P as above.  [name] MD pager [phone]    cell [phone] 01/20/2014, 12:53 PM    Subjective:   Wants to try some O2 thinks it might help (have asked RN to check sats)  Objective Filed Vitals:   01/19/14 2100 01/19/14 2327 01/20/14 0558 01/20/14 0900  BP: 129/74  134/82 95/58   Pulse: 96  95 89  Temp: 100.1 F (37.8 C) 99.2 F (37.3 C) 98.3 F (36.8 C) 98.7 F (37.1 C)  TempSrc: Oral Oral Oral Oral  Resp: 16  16 16   Height:      Weight: 84.2 kg (185 lb 10 oz)     SpO2: 92%  93% 93%   Physical Exam General: NAD alert and conversant Heart: RRR  Lungs: dim BS no rales Abdomen: obese soft Extremities: right LE scars no sig edema; left LE wrapped Dialysis Access:  Left AVGG + bruit  Dialysis Orders: Center: [facility] on MWF .  EDW 86kg HD Bath 2K/2.25Ca Time 4hr Heparin 9800. Access L AVG 450/A1.5  Hectorol 2 mcg IV/HD Aranesp 25 q Wed (8/5) Units IV/HD Venofer 0  Recent labs- Hgb 8.8, Tsat 18%, Ferr 1986, P 3.4, PTH 647    Additional Objective Labs: Basic Metabolic Panel:  Recent Labs Lab 01/17/14 0940 01/18/14 0337 01/19/14 1533  NA 138 136* 135*  K 3.4* 4.1 3.7  CL 92* 95* 93*  CO2 28 26 26   GLUCOSE 121* 109* 205*  BUN 48* 17 28*  CREATININE 7.11* 3.29* 5.45*  CALCIUM 9.1 8.3* 8.8  PHOS 3.6  --  2.7   Liver Function Tests:  Recent Labs Lab 01/17/14 0940 01/19/14 1533  AST 21  --   ALT 8  --   ALKPHOS 94  --   BILITOT 0.9  --   PROT 7.4  --   ALBUMIN 2.1* 1.9*  CBC:  Recent Labs Lab 01/17/14 0940 01/18/14 0337 01/19/14 1533  WBC 14.4* 12.0* 12.4*  NEUTROABS 11.8*  --   --   HGB 7.2* 7.9* 8.9*  HCT 23.6* 24.9* 28.4*  MCV 83.7 83.8 83.0  PLT 423* 371 357   Blood Culture    Component Value Date/Time   SDES BLOOD RIGHT HAND 01/17/2014 0940   SPECREQUEST BOTTLES DRAWN AEROBIC ONLY [place] 01/17/2014 0940   CULT  Value:        BLOOD CULTURE RECEIVED NO GROWTH TO DATE CULTURE WILL BE HELD FOR 5 DAYS BEFORE ISSUING A FINAL NEGATIVE REPORT Performed at [facility] 01/17/2014 0940   REPTSTATUS PENDING 01/17/2014 0940    Cardiac Enzymes:  Recent Labs Lab 01/17/14 0800  TROPONINI 0.33*   CBG:  Recent Labs Lab 01/18/14 1655 01/19/14 0803 01/19/14 1145 01/19/14 2102 01/20/14 0827  GLUCAP 140* 112* 137* 129* 197*   Medications:   . sodium chloride   Intravenous Once  . aspirin  325 mg Oral QPM  . atorvastatin  20 mg Oral QHS  . carvedilol  3.125 mg Oral BID WC  . collagenase   Topical Daily  . [START ON 01/21/2014] darbepoetin (ARANESP) injection - DIALYSIS  100 mcg Intravenous Q Wed-HD  . feeding supplement (RESOURCE BREEZE)  1 Container Oral BID BM  . ferric gluconate (FERRLECIT/NULECIT) IV  125 mg Intravenous Q M,W,F-HD  . heparin  5,000 Units Subcutaneous 3 times per day  . isosorbide-hydrALAZINE  1 tablet Oral BID  . multivitamin  1 tablet Oral QHS  . PARoxetine  20 mg Oral Daily  . piperacillin-tazobactam (ZOSYN)  IV  2.25 g Intravenous 3 times per day  . silver sulfADIAZINE   Topical Daily  . sodium chloride  3 mL Intravenous Q12H  . vancomycin  1,000 mg Intravenous Q M,W,F-HD

## 2014-01-21 DIAGNOSIS — I2510 Atherosclerotic heart disease of native coronary artery without angina pectoris: Secondary | ICD-10-CM

## 2014-01-21 DIAGNOSIS — F411 Generalized anxiety disorder: Secondary | ICD-10-CM

## 2014-01-21 LAB — RENAL FUNCTION PANEL
Albumin: 1.8 g/dL — ABNORMAL LOW (ref 3.5–5.2)
Anion gap: 18 — ABNORMAL HIGH (ref 5–15)
BUN: 20 mg/dL (ref 6–23)
CO2: 25 meq/L (ref 19–32)
CREATININE: 4.75 mg/dL — AB (ref 0.50–1.35)
Calcium: 8.7 mg/dL (ref 8.4–10.5)
Chloride: 88 mEq/L — ABNORMAL LOW (ref 96–112)
GFR calc Af Amer: 13 mL/min — ABNORMAL LOW (ref >90)
GFR calc non Af Amer: 11 mL/min — ABNORMAL LOW (ref >90)
Glucose, Bld: 211 mg/dL — ABNORMAL HIGH (ref 70–99)
Phosphorus: 2.4 mg/dL (ref 2.3–4.6)
Potassium: 3.7 mEq/L (ref 3.7–5.3)
Sodium: 131 mEq/L — ABNORMAL LOW (ref 137–147)

## 2014-01-21 LAB — CBC
HCT: 28.6 % — ABNORMAL LOW (ref 39.0–52.0)
Hemoglobin: 8.9 g/dL — ABNORMAL LOW (ref 13.0–17.0)
MCH: 26.7 pg (ref 26.0–34.0)
MCHC: 31.1 g/dL (ref 30.0–36.0)
MCV: 85.9 fL (ref 78.0–100.0)
Platelets: 355 10*3/uL (ref 150–400)
RBC: 3.33 MIL/uL — AB (ref 4.22–5.81)
RDW: 17 % — AB (ref 11.5–15.5)
WBC: 13.4 10*3/uL — ABNORMAL HIGH (ref 4.0–10.5)

## 2014-01-21 LAB — WOUND CULTURE

## 2014-01-21 LAB — GLUCOSE, CAPILLARY
GLUCOSE-CAPILLARY: 122 mg/dL — AB (ref 70–99)
Glucose-Capillary: 133 mg/dL — ABNORMAL HIGH (ref 70–99)

## 2014-01-21 MED ORDER — LEVOFLOXACIN 500 MG PO TABS: 500.0000 mg | ORAL_TABLET | ORAL | Status: DC

## 2014-01-21 MED ORDER — ALBUTEROL SULFATE (2.5 MG/3ML) 0.083% IN NEBU: 2.5000 mg | INHALATION_SOLUTION | Freq: Four times a day (QID) | RESPIRATORY_TRACT | Status: DC | PRN

## 2014-01-21 MED ORDER — MIDODRINE HCL 5 MG PO TABS
ORAL_TABLET | ORAL | Status: AC
  Filled 2014-01-21: qty 2

## 2014-01-21 MED ORDER — BOOST / RESOURCE BREEZE PO LIQD: 1.0000 | Freq: Two times a day (BID) | ORAL | Status: DC

## 2014-01-21 MED ORDER — DARBEPOETIN ALFA-POLYSORBATE 100 MCG/0.5ML IJ SOLN
INTRAMUSCULAR | Status: AC
  Filled 2014-01-21: qty 0.5

## 2014-01-21 MED ORDER — SILVER SULFADIAZINE 1 % EX CREA: TOPICAL_CREAM | Freq: Every day | CUTANEOUS | Status: DC

## 2014-01-21 MED ORDER — COLLAGENASE 250 UNIT/GM EX OINT: 1.0000 "application " | TOPICAL_OINTMENT | Freq: Every day | CUTANEOUS | Status: DC

## 2014-01-21 MED ORDER — LEVOFLOXACIN 500 MG PO TABS
500.0000 mg | ORAL_TABLET | ORAL | Status: DC
  Filled 2014-01-21 (×2): qty 1

## 2014-01-21 MED ORDER — DARBEPOETIN ALFA-POLYSORBATE 150 MCG/0.3ML IJ SOLN
INTRAMUSCULAR | Status: AC
  Filled 2014-01-21: qty 0.3

## 2014-01-21 MED ORDER — PAROXETINE HCL 20 MG PO TABS: 20.0000 mg | ORAL_TABLET | Freq: Every day | ORAL | Status: DC

## 2014-01-21 MED ORDER — HYDROCODONE-ACETAMINOPHEN 5-325 MG PO TABS: 1.0000 | ORAL_TABLET | Freq: Four times a day (QID) | ORAL | Status: DC | PRN

## 2014-01-21 MED ORDER — COLLAGENASE 250 UNIT/GM EX OINT: TOPICAL_OINTMENT | Freq: Every day | CUTANEOUS | Status: DC

## 2014-01-21 NOTE — Progress Notes (Signed)
[facility] Progress Note  Assessment/Plan: 1. Altered mental status - CT head neg. +Leukocytosis. Blood and wound cultures pending. On IV Vanc/Zosyn. MS improved -  likely infection related; at baseline for the past several days 2. Recurrent calciphylaxis/Non-healing LLE wound - Cultures pending. On abx. WOC consulted per primary. Plastics saw rec: local care 3. ESRD - MWF - HD tomorrow - no prob with Monday tmt.- HD today - evaluate for new EDW afer HD today 4. Hypertension/volume - SBPs 110s-120s. On low dose Coreg. Improved UF 2.2 8/10 with post wt 84.2 Challenge edw as tolerated - will have a lower edw at discharge.  5. Anemia - Hgb 8.9 stable s/p 2 units PRBCs on 8/8. Previously 8.3 outpatient. Tsat 18% with Ferritin 1986, likely ESA resistance secondary to LLE wounds. Rec'd Aranesp 25 mcg on 8/5. Dose increased to 100. Fe load initiated - will give 4 dsoes; ferritin ^ likely inflammation. Recheck CBC in a.m and transfuse PRN. 6. Metabolic bone disease - Ca 8.3 (~9.8 corrected). Vit D held yesterday for hypercalcemia. He is s/p failed parathyroidectomy and last PTH was 647. Use low Ca bath as K+ permits. Follow phos and resume binder once eating better. 7. Malnutrition - Poor appetite. Renal diet, multivit, supplements.; discussed eating ^ pro foods" because it is good for healing; add prostat 8. DM - BS controlled; per primary 9. Disp - I see that daughter is interested in moving to another county. If that is the cause his dialysis care will need to be changed to another practice and dialysis facility before the move can be made. D/W CM - informed daughter he can be d/c locally to a NH and if she continues to wish to have him moved to [facility], this can be arranged after discharge so there is time to accept him into a new practice.;  Per CM today, he has bed offers and [place]. D/C is anticipated today after daughter decides which one he is being d/c to.  [name],  PA-C [facility] [place] [phone] 01/21/2014,10:07 AM  LOS: 4 days   Pt seen, examined and agree w A/P as above.  [name] MD pager [phone]    cell [phone] 01/21/2014, 1:41 PM    Subjective:   Doesn't know where he is being d/c to  Objective Filed Vitals:   01/20/14 1721 01/20/14 2036 01/21/14 0432 01/21/14 0921  BP: 143/75 115/70 145/74 125/70  Pulse: 92 89 89 92  Temp: 97.6 F (36.4 C) 99.1 F (37.3 C) 98.7 F (37.1 C) 98.3 F (36.8 C)  TempSrc: Oral   Oral  Resp: 18 18 18 19   Height:      Weight:  84.9 kg (187 lb 2.7 oz)    SpO2: 95% 94% 91% 95%   Physical Exam  Wound care being done to left LE General: NAD  Heart: RRR Lungs: dim bases Abdomen: obese soft Extremities:  Right LE scars; left anterior wounds no obv necrosis, deep; collagenase being applied Dialysis Access: left AVGG  Dialysis Orders: Center: [facility] on MWF .  EDW 86kg HD Bath 2K/2.25Ca Time 4hr Heparin 9800. Access L AVG 450/A1.5  Hectorol 2 mcg IV/HD Aranesp 25 q Wed (8/5) Units IV/HD Venofer 0  Recent labs- Hgb 8.8, Tsat 18%, Ferr 1986, P 3.4, PTH 647   Additional Objective Labs: Basic Metabolic Panel:  Recent Labs Lab 01/17/14 0940 01/18/14 0337 01/19/14 1533  NA 138 136* 135*  K 3.4* 4.1 3.7  CL 92* 95* 93*  CO2 28 26 26   GLUCOSE 121* 109* 205*  BUN 48* 17 28*  CREATININE 7.11* 3.29* 5.45*  CALCIUM 9.1 8.3* 8.8  PHOS 3.6  --  2.7   Liver Function Tests:  Recent Labs Lab 01/17/14 0940 01/19/14 1533  AST 21  --   ALT 8  --   ALKPHOS 94  --   BILITOT 0.9  --   PROT 7.4  --   ALBUMIN 2.1* 1.9*   CBC:  Recent Labs Lab 01/17/14 0940 01/18/14 0337 01/19/14 1533  WBC 14.4* 12.0* 12.4*  NEUTROABS 11.8*  --   --   HGB 7.2* 7.9* 8.9*  HCT 23.6* 24.9* 28.4*  MCV 83.7 83.8 83.0  PLT 423* 371 357   Blood Culture    Component Value Date/Time   SDES BLOOD RIGHT HAND 01/17/2014 0940   SPECREQUEST BOTTLES DRAWN AEROBIC ONLY [place] 01/17/2014 0940   CULT   Value:        BLOOD CULTURE RECEIVED NO GROWTH TO DATE CULTURE WILL BE HELD FOR 5 DAYS BEFORE ISSUING A FINAL NEGATIVE REPORT Performed at [facility] 01/17/2014 0940   REPTSTATUS PENDING 01/17/2014 0940    Cardiac Enzymes:  Recent Labs Lab 01/17/14 0800  TROPONINI 0.33*   CBG:  Recent Labs Lab 01/20/14 0827 01/20/14 1142 01/20/14 1720 01/20/14 2036 01/21/14 0744  GLUCAP 197* 158* 144* 176* 133*  Medications:   . sodium chloride   Intravenous Once  . aspirin  325 mg Oral QPM  . atorvastatin  20 mg Oral QHS  . carvedilol  3.125 mg Oral BID WC  . collagenase   Topical Daily  . darbepoetin (ARANESP) injection - DIALYSIS  100 mcg Intravenous Q Wed-HD  . feeding supplement (PRO-STAT SUGAR FREE 64)  30 mL Oral TID WC  . feeding supplement (RESOURCE BREEZE)  1 Container Oral BID BM  . ferric gluconate (FERRLECIT/NULECIT) IV  125 mg Intravenous Q M,W,F-HD  . heparin  5,000 Units Subcutaneous 3 times per day  . isosorbide-hydrALAZINE  1 tablet Oral BID  . multivitamin  1 tablet Oral QHS  . PARoxetine  20 mg Oral Daily  . piperacillin-tazobactam (ZOSYN)  IV  2.25 g Intravenous 3 times per day  . silver sulfADIAZINE   Topical Daily  . sodium chloride  3 mL Intravenous Q12H  . vancomycin  1,000 mg Intravenous Q M,W,F-HD

## 2014-01-21 NOTE — Progress Notes (Signed)
[facility]. Report given to charge nurse.

## 2014-01-21 NOTE — Progress Notes (Signed)
CSW spoke with daughter [name] who confirmed she would like placement at [facility]. CSW contacted facility who will arrange a time to do paperwork with family today. CSW also notified MD.  CSW to assist with dc today.  Hunt Oris, MSW, [place]

## 2014-01-21 NOTE — Discharge Summary (Signed)
Physician Discharge Summary  Patient ID: YOSGAR DEMIRJIAN MRN: [id number] DOB/AGE: [birth year] 78 y.o.  Admit date: 01/17/2014 Discharge date: 01/21/2014  Primary Care Physician:  Philis Fendt, MD  Discharge Diagnoses:    . Sepsis . HYPERLIPIDEMIA . Calciphylaxis of left lower extremity with nonhealing ulcer, infected open wound  . CAD in native artery . Secondary hyperparathyroidism . Renovascular hypertension . Diabetes mellitus due to underlying condition . Acute encephalopathy  Consults:  Nephrology   Recommendations for Outpatient Follow-up:   Wound care  Apply silver sulfadiazine 1% cream to left leg distal wound that has an escar daily.  Apply Santyl ointment to left LE large leg wound daily thereafter. Top with saline gauze, dry gauze then ABD pads and wrap.     Allergies:   Allergies  Allergen Reactions  . Benazepril Hcl Hives, Itching and Rash    flareups on patient head     Discharge Medications:   Medication List         acetaminophen 325 MG tablet  Commonly known as:  TYLENOL  Take 2 tablets (650 mg total) by mouth every 6 (six) hours as needed for mild pain, fever or headache.     albuterol (2.5 MG/3ML) 0.083% nebulizer solution  Commonly known as:  PROVENTIL  Take 3 mLs (2.5 mg total) by nebulization every 6 (six) hours as needed for wheezing or shortness of breath.     aspirin 325 MG tablet  Take 325 mg by mouth every evening.     atorvastatin 20 MG tablet  Commonly known as:  LIPITOR  Take 20 mg by mouth at bedtime.     b complex-vitamin c-folic acid 0.8 MG Tabs tablet  Take 0.8 mg by mouth at bedtime.     carvedilol 3.125 MG tablet  Commonly known as:  COREG  Take 1 tablet (3.125 mg total) by mouth 2 (two) times daily with a meal.     collagenase ointment  Commonly known as:  SANTYL  Apply 1 application topically daily.     feeding supplement (RESOURCE BREEZE) Liqd  Take 1 Container by mouth 2 (two) times daily between meals.      HYDROcodone-acetaminophen 5-325 MG per tablet  Commonly known as:  NORCO/VICODIN  Take 1 tablet by mouth every 6 (six) hours as needed for moderate pain or severe pain.     hydrOXYzine 10 MG tablet  Commonly known as:  ATARAX/VISTARIL  Take 10 mg by mouth at bedtime as needed (for sleep).     isosorbide-hydrALAZINE 20-37.5 MG per tablet  Commonly known as:  BIDIL  Take 1 tablet by mouth 2 (two) times daily.     levofloxacin 500 MG tablet  Commonly known as:  LEVAQUIN  Take 1 tablet (500 mg total) by mouth every other day. X 2 weeks     PARoxetine 20 MG tablet  Commonly known as:  PAXIL  Take 1 tablet (20 mg total) by mouth daily.     silver sulfADIAZINE 1 % cream  Commonly known as:  SILVADENE  Apply topically daily.         Brief H and P: For complete details please refer to admission H and P, but in brief Sean [name] is a 78 y.o. male with history of ESRD on MWF hemodialysis, CAD and MI 2011, hypertension, hyperlipidemia, chronic calciphylaxis and open wound LLE, secondary hyperparathyroidism, ischemic cardiomyopathy with EF on 12/15/13:45-50%, nuclear stress test 12/14/13 that did not show any reversible ischemia but showed EF of 25%, hyperlipidemia,  DM 2, couple of recent hospitalizations in the last one 12/20/13-12/24/13 for leg pain, presented from home with altered mental status.  After recent hospitalization, he was discharged to skilled nursing facility and was discharged from there to home on 01/15/14. His last dialysis was on 01/14/14. He missed dialysis on 01/16/14 . The dialysis center was unable to get in touch with patient and called EMS. EMS reported patient was complaining of feeling weak and sore in the coccyx area. Patient was unable to provide significant history secondary to altered mental status. He does complain of left leg pain at site of wound with leg movement. He was unable to tell whether he has dyspnea, cough or fevers. According to discussion with patient's  daughter, he gets confused whenever he misses dialysis. Family was concerned about his unsafe home living situation by himself and will pursue nursing home placement at discharge. In the ED, left leg wound appeared infected, tachycardic, WBC 14.4, potassium 3.4, creatinine 7.11, hemoglobin 7.2, troponin 0.33 BNP >70,000, CT head without acute findings and chest x-ray are with cardiomegaly and mild interstitial edema. Patient has been empirically started on IV vancomycin and Zosyn and hospitalist admission requested   Hospital Course:  [name] is an 78 y.o. male with history of ESRD on MWF hemodialysis, CAD and MI 2011, hypertension, hyperlipidemia, chronic calciphylaxis and open wound LLE,  Presented from home with altered mental status. He missed  dialysis on 8/7. In the ED, left leg wound appeared infected, tachycardic, WBC 14.4, potassium 3.4, creatinine 7.11, hemoglobin 7.2, troponin 0.33 BNP >70,000, CT head without acute findings and chest x-ray are with cardiomegaly and mild interstitial edema. Patient was admitted for further workup.  Calciphylaxis of left lower extremity with nonhealing ulcer  Patient was admitted for further workup, wound care consultation was obtained, please see wound care recommendations above. Patient was empirically treated with IV vancomycin and Zosyn. Wound culture shows MSSA and Citrobacter, hence antibiotics narrowed to levofloxacin per sensitivities. Plastic surgery was consulted and recommended local wound care  Early sepsis from #1  - Improved, patient was continued on IV antibiotics until discharge  Acute encephalopathy: Improved  - CT head without any acute findings, likely due to uremia from missed dialysis versus sepsis, underlying dementia. Appears to be at baseline now.   HYPERLIPIDEMIA  - Continue statins   End stage renal disease on dialysis  - On hemodialysis MWF, receiving hemodialysis today , next hemodialysis, 08/14.  History of  CAD/cardiomyopathy/mild acute systolic CHF, volume overload : no reported chest pains, patient had minimally elevated troponin although decreased compared to recent admission. Patient had recent stress test done without reversible ischemia. He was continued on aspirin, Coreg and bidil. Continue hemodialysis for volume management.    Hypertension: Continue Coreg   Day of Discharge BP 122/78  Pulse 108  Temp(Src) 98.7 F (37.1 C) (Oral)  Resp 19  Ht 5\' 5"  (1.651 m)  Wt 85.3 kg (188 lb 0.8 oz)  BMI 31.29 kg/m2  SpO2 95%  Physical Exam:  General: Alert and awake, oriented x3, not in any acute distress.  CVS: S1-S2 clear, no murmur rubs or gallops  Chest: clear to auscultation bilaterally, no wheezing, rales or rhonchi  Abdomen: soft nontender, nondistended, normal bowel sounds  Extremities: left lower extremity dressing, extensive scarring of the right leg      The results of significant diagnostics from this hospitalization (including imaging, microbiology, ancillary and laboratory) are listed below for reference.    LAB RESULTS: Basic  Metabolic Panel:  Recent Labs Lab 01/19/14 1533 01/21/14 1225  NA 135* 131*  K 3.7 3.7  CL 93* 88*  CO2 26 25  GLUCOSE 205* 211*  BUN 28* 20  CREATININE 5.45* 4.75*  CALCIUM 8.8 8.7  PHOS 2.7 2.4   Liver Function Tests:  Recent Labs Lab 01/17/14 0940 01/19/14 1533 01/21/14 1225  AST 21  --   --   ALT 8  --   --   ALKPHOS 94  --   --   BILITOT 0.9  --   --   PROT 7.4  --   --   ALBUMIN 2.1* 1.9* 1.8*   No results found for this basename: LIPASE, AMYLASE,  in the last 168 hours No results found for this basename: AMMONIA,  in the last 168 hours CBC:  Recent Labs Lab 01/17/14 0940  01/19/14 1533 01/21/14 1225  WBC 14.4*  < > 12.4* 13.4*  NEUTROABS 11.8*  --   --   --   HGB 7.2*  < > 8.9* 8.9*  HCT 23.6*  < > 28.4* 28.6*  MCV 83.7  < > 83.0 85.9  PLT 423*  < > 357 355  < > = values in this interval not  displayed. Cardiac Enzymes:  Recent Labs Lab 01/17/14 0800  TROPONINI 0.33*   BNP: No components found with this basename: POCBNP,  CBG:  Recent Labs Lab 01/20/14 2036 01/21/14 0744  GLUCAP 176* 133*    Significant Diagnostic Studies:  Ct Head Wo Contrast  01/17/2014   CLINICAL DATA:  Altered mental status.  Weakness.  Dialysis patient.  EXAM: CT HEAD WITHOUT CONTRAST  TECHNIQUE: Contiguous axial images were obtained from the base of the skull through the vertex without intravenous contrast.  COMPARISON:  12/13/2013.  FINDINGS: Stable cerebral atrophy, ventriculomegaly and periventricular white matter disease. Remote left occipital infarct with encephalomalacia ex vacuo dilatation of the occipital horn of the left lateral ventricle. No extra-axial fluid collections are identified. No CT findings for acute hemispheric infarction or intracranial hemorrhage. No mass lesions. The brainstem and cerebellum are grossly normal and stable.  Stable dense vascular calcifications. The bony structures are intact. No fracture or bone lesion. The paranasal sinuses and mastoid air cells are clear. The globes are intact.  IMPRESSION: Stable atrophy, ventriculomegaly and periventricular white matter disease.  Remote left occipital infarct.  No acute intracranial findings or mass lesions.   Electronically Signed   By: Kalman Jewels M.D.   On: 01/17/2014 08:28   Dg Chest Port 1 View  01/17/2014   CLINICAL DATA:  Weakness.  History of end-stage renal disease.  EXAM: PORTABLE CHEST - 1 VIEW  COMPARISON:  12/20/2013  FINDINGS: There is stable mild cardiac enlargement. Lungs show evidence of mild interstitial edema. There is no evidence of airspace consolidation, pneumothorax, nodule or pleural fluid.  IMPRESSION: Cardiomegaly and mild interstitial edema.   Electronically Signed   By: Aletta Edouard M.D.   On: 01/17/2014 09:41      Disposition and Follow-up:     Discharge Instructions   Diet - low sodium  heart healthy    Complete by:  As directed      Increase activity slowly    Complete by:  As directed             DISPOSITION: Skilled nursing facility  DIET: Renal diet   DISCHARGE FOLLOW-UP Follow-up Information   Follow up with Nolene Ebbs A, MD. Schedule an appointment as soon as possible for  a visit in 10 days. (for hospital follow-up)    Specialty:  Internal Medicine   Contact information:   [address] [place] [place] [id number] [phone]       Time spent on Discharge: 40 mins  Signed:   RAI,RIPUDEEP M.D. [facility] 01/21/2014, 3:04 PM Pager: [phone]   **Disclaimer: This note was dictated with voice recognition software. Similar sounding words can inadvertently be transcribed and this note may contain transcription errors which may not have been corrected upon publication of note.**

## 2014-01-21 NOTE — Procedures (Signed)
I was present at this dialysis session, have reviewed the session itself and made  appropriate changes  [name] MD (pgr) [phone]    ([phone] 01/21/2014, 1:40 PM

## 2014-01-21 NOTE — Progress Notes (Addendum)
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 - Treat empirically with IV vancomycin and Zosyn - wound culture shows MSSA and Citrobacter, will change to oral doxycycline and ciprofloxacin upon DC  - Consulted plastic surgery- recommend local care.  Active Problems: Early sepsis from #1 - Improved, continue IV antibiotics until DC  Acute encephalopathy: Improved - CT head without any acute findings, likely due to uremia from missed dialysis versus sepsis, underlying dementia. Appears to be at baseline now.    HYPERLIPIDEMIA - Continue statins    End stage renal disease on dialysis - On hemodialysis MWF, receiving hemodialysis today  History of CAD/cardiomyopathy/mild acute systolic CHF, volume overload - Continue hemodialysis for volume management - Recent stress test without reversible ischemia - Continue aspirin, Coreg, Bidil  Hypertension: Continue Coreg  DVT Prophylaxis:  Code Status:  Family Communication:  Disposition:   Consultants:  Nephrology  Procedures:  Hemodialysis  Antibiotics:  IV vancomycin  IV Zosyn  Subjective: Patient seen and examined, denies any specific complaints  Objective: Weight change: -1.6 kg (-3 lb 8.4 oz)  Intake/Output Summary (Last 24 hours) at 01/21/14 1305 Last data filed at 01/21/14 1039  Gross per 24 hour  Intake   1070 ml  Output      0 ml  Net   1070 ml   Blood pressure 121/71, pulse 91, temperature 98.7 F (37.1 C), temperature source Oral, resp. rate 19, height 5\' 5"  (1.651 m), weight 85.3 kg (188 lb 0.8 oz), SpO2 95.00%.  Physical Exam: General: Alert and awake, oriented x3, not in any acute distress. CVS: S1-S2 clear, no murmur rubs or gallops Chest: clear to auscultation bilaterally, no  wheezing, rales or rhonchi Abdomen: soft nontender, nondistended, normal bowel sounds  Extremities: left lower extremity dressing, extensive scarring of the right leg  Lab Results: Basic Metabolic Panel:  Recent Labs Lab 01/19/14 1533 01/21/14 1225  NA 135* 131*  K 3.7 3.7  CL 93* 88*  CO2 26 25  GLUCOSE 205* 211*  BUN 28* 20  CREATININE 5.45* 4.75*  CALCIUM 8.8 8.7  PHOS 2.7 2.4   Liver Function Tests:  Recent Labs Lab 01/17/14 0940 01/19/14 1533 01/21/14 1225  AST 21  --   --   ALT 8  --   --   ALKPHOS 94  --   --   BILITOT 0.9  --   --   PROT 7.4  --   --   ALBUMIN 2.1* 1.9* 1.8*   No results found for this basename: LIPASE, AMYLASE,  in the last 168 hours No results found for this basename: AMMONIA,  in the last 168 hours CBC:  Recent Labs Lab 01/17/14 0940  01/19/14 1533 01/21/14 1225  WBC 14.4*  < > 12.4* 13.4*  NEUTROABS 11.8*  --   --   --   HGB 7.2*  < > 8.9* 8.9*  HCT 23.6*  < > 28.4* 28.6*  MCV 83.7  < > 83.0 85.9  PLT 423*  < > 357 355  < > = values in this interval not displayed. Cardiac Enzymes:  Recent Labs Lab 01/17/14 0800  TROPONINI 0.33*   BNP: No components found with this basename: POCBNP,  CBG:  Recent Labs Lab 01/20/14 0827 01/20/14 1142 01/20/14 1720 01/20/14 2036 01/21/14  [place]     Micro Results: Recent Results (from the past 240 hour(s))  WOUND CULTURE     Status: None   Collection Time    01/17/14  8:05 AM      Result Value Ref Range Status   Specimen Description LEG LEFT   Final   Special Requests NONE   Final   Gram Stain     Final   Value: RARE SQUAMOUS EPITHELIAL CELLS PRESENT     MODERATE WBC PRESENT, PREDOMINANTLY PMN     FEW GRAM POSITIVE COCCI IN CLUSTERS     MODERATE GRAM NEGATIVE RODS     RARE GRAM POSITIVE RODS   Culture     Final   Value: MODERATE STAPHYLOCOCCUS AUREUS     Note: RIFAMPIN AND GENTAMICIN SHOULD NOT BE USED AS SINGLE DRUGS FOR TREATMENT OF STAPH  INFECTIONS.     MODERATE CITROBACTER AMALONATICUS     Performed at [facility]   Report Status 01/21/2014 FINAL   Final   Organism ID, Bacteria STAPHYLOCOCCUS AUREUS   Final   Organism ID, Bacteria CITROBACTER AMALONATICUS   Final  CULTURE, BLOOD (ROUTINE X 2)     Status: None   Collection Time    01/17/14  9:30 AM      Result Value Ref Range Status   Specimen Description BLOOD RIGHT ARM   Final   Special Requests     Final   Value: BOTTLES DRAWN AEROBIC AND ANAEROBIC 10CC BLUE, 5CC RED   Culture  Setup Time     Final   Value: 01/17/2014 20:29     Performed at [facility]   Culture     Final   Value:        BLOOD CULTURE RECEIVED NO GROWTH TO DATE CULTURE WILL BE HELD FOR 5 DAYS BEFORE ISSUING A FINAL NEGATIVE REPORT     Performed at [facility]   Report Status PENDING   Incomplete  CULTURE, BLOOD (ROUTINE X 2)     Status: None   Collection Time    01/17/14  9:40 AM      Result Value Ref Range Status   Specimen Description BLOOD RIGHT HAND   Final   Special Requests BOTTLES DRAWN AEROBIC ONLY [facility]   Final   Culture  Setup Time     Final   Value: 01/17/2014 20:29     Performed at [facility]   Culture     Final   Value:        BLOOD CULTURE RECEIVED NO GROWTH TO DATE CULTURE WILL BE HELD FOR 5 DAYS BEFORE ISSUING A FINAL NEGATIVE REPORT     Performed at [facility]   Report Status PENDING   Incomplete  MRSA PCR SCREENING     Status: None   Collection Time    01/17/14  9:33 PM      Result Value Ref Range Status   MRSA by PCR NEGATIVE  NEGATIVE Final   Comment:            The GeneXpert MRSA Assay (FDA     approved for NASAL specimens     only), is one component of a     comprehensive MRSA colonization     surveillance program. It is not     intended to diagnose MRSA     infection nor to guide or     monitor treatment for  MRSA infections.    Studies/Results: Ct Head Wo Contrast  01/17/2014   CLINICAL DATA:  Altered mental  status.  Weakness.  Dialysis patient.  EXAM: CT HEAD WITHOUT CONTRAST  TECHNIQUE: Contiguous axial images were obtained from the base of the skull through the vertex without intravenous contrast.  COMPARISON:  12/13/2013.  FINDINGS: Stable cerebral atrophy, ventriculomegaly and periventricular white matter disease. Remote left occipital infarct with encephalomalacia ex vacuo dilatation of the occipital horn of the left lateral ventricle. No extra-axial fluid collections are identified. No CT findings for acute hemispheric infarction or intracranial hemorrhage. No mass lesions. The brainstem and cerebellum are grossly normal and stable.  Stable dense vascular calcifications. The bony structures are intact. No fracture or bone lesion. The paranasal sinuses and mastoid air cells are clear. The globes are intact.  IMPRESSION: Stable atrophy, ventriculomegaly and periventricular white matter disease.  Remote left occipital infarct.  No acute intracranial findings or mass lesions.   Electronically Signed   By: Kalman Jewels M.D.   On: 01/17/2014 08:28   Dg Chest Port 1 View  01/17/2014   CLINICAL DATA:  Weakness.  History of end-stage renal disease.  EXAM: PORTABLE CHEST - 1 VIEW  COMPARISON:  12/20/2013  FINDINGS: There is stable mild cardiac enlargement. Lungs show evidence of mild interstitial edema. There is no evidence of airspace consolidation, pneumothorax, nodule or pleural fluid.  IMPRESSION: Cardiomegaly and mild interstitial edema.   Electronically Signed   By: Aletta Edouard M.D.   On: 01/17/2014 09:41    Medications: Scheduled Meds: . sodium chloride   Intravenous Once  . aspirin  325 mg Oral QPM  . atorvastatin  20 mg Oral QHS  . carvedilol  3.125 mg Oral BID WC  . collagenase   Topical Daily  . darbepoetin      . darbepoetin (ARANESP) injection - DIALYSIS  100 mcg Intravenous Q Wed-HD  . feeding supplement (PRO-STAT SUGAR FREE 64)  30 mL Oral TID WC  . feeding supplement (RESOURCE BREEZE)   1 Container Oral BID BM  . ferric gluconate (FERRLECIT/NULECIT) IV  125 mg Intravenous Q M,W,F-HD  . heparin  5,000 Units Subcutaneous 3 times per day  . isosorbide-hydrALAZINE  1 tablet Oral BID  . midodrine      . multivitamin  1 tablet Oral QHS  . PARoxetine  20 mg Oral Daily  . piperacillin-tazobactam (ZOSYN)  IV  2.25 g Intravenous 3 times per day  . silver sulfADIAZINE   Topical Daily  . sodium chloride  3 mL Intravenous Q12H  . vancomycin  1,000 mg Intravenous Q M,W,F-HD      LOS: 4 days   Carey Lafon M.D. [facility] 01/21/2014, 1:05 PM Pager: [phone]  If 7PM-7AM, please contact night-coverage [email or web address] Password [email or web address]  **Disclaimer: This note was dictated with voice recognition software. Similar sounding words can inadvertently be transcribed and this note may contain transcription errors which may not have been corrected upon publication of note.**

## 2014-01-21 NOTE — Progress Notes (Signed)
Patient Discharge:  Disposition: [facility]  Education: notified patient of discharge.  Social worker notified daughter of discharge.    IV: removed  Telemetry: n/a   Follow-up appointments: n/a  Prescriptions:  n/a  Transportation:  EMS to nursing facility  Belongings: gathered by [employer]

## 2014-01-21 NOTE — Progress Notes (Signed)
CSW has been in constant communication with daughters, most recently daughter [name] who lives in [place] and also prefers placement in [place]. CSW has provided bed offers and informed daughter that she will need to have a decision today. Daughter aware that both [facility] and [facility] are agreeable to accept pt however daughter will be responsible to provide transportation to/from dialysis until his dialysis center is switched (daughter and facility to coordinate switching centers once pt is discharged.) Pt currently has several offers in [place] however daughter is not agreeable/pleased with offers.  CSW to remain following and assist with dc.  Hunt Oris, MSW, [place]

## 2014-01-22 ENCOUNTER — Other Ambulatory Visit: Payer: Self-pay | Admitting: *Deleted

## 2014-01-22 MED ORDER — HYDROCODONE-ACETAMINOPHEN 5-325 MG PO TABS: 1.0000 | ORAL_TABLET | Freq: Four times a day (QID) | ORAL | Status: DC | PRN

## 2014-01-23 LAB — CULTURE, BLOOD (ROUTINE X 2)
CULTURE: NO GROWTH
Culture: NO GROWTH

## 2014-01-26 ENCOUNTER — Non-Acute Institutional Stay (SKILLED_NURSING_FACILITY): Payer: Medicare Other | Admitting: Internal Medicine

## 2014-01-26 ENCOUNTER — Encounter: Payer: Self-pay | Admitting: Internal Medicine

## 2014-01-26 DIAGNOSIS — T148XXA Other injury of unspecified body region, initial encounter: Secondary | ICD-10-CM

## 2014-01-26 DIAGNOSIS — I2589 Other forms of chronic ischemic heart disease: Secondary | ICD-10-CM

## 2014-01-26 DIAGNOSIS — G9340 Encephalopathy, unspecified: Secondary | ICD-10-CM

## 2014-01-26 DIAGNOSIS — Z992 Dependence on renal dialysis: Secondary | ICD-10-CM

## 2014-01-26 DIAGNOSIS — E089 Diabetes mellitus due to underlying condition without complications: Secondary | ICD-10-CM

## 2014-01-26 DIAGNOSIS — I255 Ischemic cardiomyopathy: Secondary | ICD-10-CM

## 2014-01-26 DIAGNOSIS — N186 End stage renal disease: Secondary | ICD-10-CM

## 2014-01-26 DIAGNOSIS — L089 Local infection of the skin and subcutaneous tissue, unspecified: Secondary | ICD-10-CM

## 2014-01-26 DIAGNOSIS — E139 Other specified diabetes mellitus without complications: Secondary | ICD-10-CM

## 2014-01-26 DIAGNOSIS — L97909 Non-pressure chronic ulcer of unspecified part of unspecified lower leg with unspecified severity: Secondary | ICD-10-CM

## 2014-01-26 DIAGNOSIS — E785 Hyperlipidemia, unspecified: Secondary | ICD-10-CM

## 2014-01-26 DIAGNOSIS — L97921 Non-pressure chronic ulcer of unspecified part of left lower leg limited to breakdown of skin: Principal | ICD-10-CM

## 2014-01-26 DIAGNOSIS — I150 Renovascular hypertension: Secondary | ICD-10-CM

## 2014-01-26 DIAGNOSIS — T07XXXA Unspecified multiple injuries, initial encounter: Secondary | ICD-10-CM

## 2014-01-26 NOTE — Assessment & Plan Note (Signed)
-   CT head without any acute findings, likely due to uremia from missed dialysis versus sepsis, underlying dementia. Appears to be at baseline now.

## 2014-01-26 NOTE — Progress Notes (Signed)
MRN: [id number] Name: Sean [name]  Sex: male Age: 78 y.o. DOB: 07-27-1935  [place] #: Helene Kelp Facility/Room: 311 Level Of Care: SNF Provider: Inocencio Homes D Emergency Contacts: Extended Emergency Contact Information Primary Emergency Contact: [facility] Address: [address]          [place], [place] [address] [name] of [place] Phone: [phone] Relation: Daughter Secondary Emergency Contact: [facility] Address: [address]          [place], [place] [address] [place] Mobile Phone: [phone] Relation: Son  Code Status: FULL  Allergies: Benazepril hcl  Chief Complaint  Patient presents with  . nursing home admission    HPI: Patient is 78 y.o. male who is admitted for cellulitis LLE, for OT/Pt.  Past Medical History  Diagnosis Date  . Hyperparathyroidism   . Arthritis   . Hyperlipidemia   . Hypertension   . Neuromuscular disorder   . Stroke   . GERD (gastroesophageal reflux disease)   . Headache(784.0)     occasional  migraine   . Heart attack 2011; 1990's  . Type II diabetes mellitus   . Peripheral neuropathy   . Anxiety   . Depression   . Dialysis patient     M, W, F; [facility] (09/19/11)  . Chronic kidney disease     [facility] MWF    Past Surgical History  Procedure Laterality Date  . Colon surgery  2011    colon resection   . Parathyroidectomy  2011  . Capd insertion  09/12/2011    Procedure: LAPAROSCOPIC INSERTION CONTINUOUS AMBULATORY PERITONEAL DIALYSIS  (CAPD) CATHETER;  Surgeon: Edward Jolly, MD;  Location: [facility];  Service: General;  Laterality: N/A;  . Sp av dialysis shunt access existing *l*  2003  . Dg av dialysis shunt intro ndl*r* or      "not working" (09/19/11)  . Capd removal  03/12/2012    Procedure: CONTINUOUS AMBULATORY PERITONEAL DIALYSIS  (CAPD) CATHETER REMOVAL;  Surgeon: Edward Jolly, MD;  Location: [facility];  Service: General;  Laterality: N/A;      Medication List       This list is accurate as of: 01/26/14  8:09 PM.  Always use your most recent med list.               acetaminophen 325 MG tablet  Commonly known as:  TYLENOL  Take 2 tablets (650 mg total) by mouth every 6 (six) hours as needed for mild pain, fever or headache.     albuterol (2.5 MG/3ML) 0.083% nebulizer solution  Commonly known as:  PROVENTIL  Take 3 mLs (2.5 mg total) by nebulization every 6 (six) hours as needed for wheezing or shortness of breath.     aspirin 325 MG tablet  Take 325 mg by mouth every evening.     atorvastatin 20 MG tablet  Commonly known as:  LIPITOR  Take 20 mg by mouth at bedtime.     b complex-vitamin c-folic acid 0.8 MG Tabs tablet  Take 0.8 mg by mouth at bedtime.     carvedilol 3.125 MG tablet  Commonly known as:  COREG  Take 1 tablet (3.125 mg total) by mouth 2 (two) times daily with a meal.     collagenase ointment  Commonly known as:  SANTYL  Apply 1 application topically daily.     feeding supplement (RESOURCE BREEZE) Liqd  Take 1 Container by mouth 2 (two) times daily between meals.     HYDROcodone-acetaminophen  5-325 MG per tablet  Commonly known as:  NORCO/VICODIN  Take 1 tablet by mouth every 6 (six) hours as needed for moderate pain or severe pain.     hydrOXYzine 10 MG tablet  Commonly known as:  ATARAX/VISTARIL  Take 10 mg by mouth at bedtime as needed (for sleep).     isosorbide-hydrALAZINE 20-37.5 MG per tablet  Commonly known as:  BIDIL  Take 1 tablet by mouth 2 (two) times daily.     levofloxacin 500 MG tablet  Commonly known as:  LEVAQUIN  Take 1 tablet (500 mg total) by mouth every other day. X 2 weeks     PARoxetine 20 MG tablet  Commonly known as:  PAXIL  Take 1 tablet (20 mg total) by mouth daily.        No orders of the defined types were placed in this encounter.     There is no immunization history on file for this patient.  History  Substance Use Topics  . Smoking status: Former Smoker -- 0.25  packs/day for 7 years    Types: Cigarettes    Quit date: 06/12/1976  . Smokeless tobacco: Never Used  . Alcohol Use: Yes     Comment: 09/19/11 "occasionally; last alcohol 2011"    Family history is noncontributory    Review of Systems  DATA OBTAINED: from patient; no c/o GENERAL: Feels well no fevers, fatigue, appetite changes SKIN: No itching, rash; wound is much better EYES: No eye pain, redness, discharge EARS: No earache, tinnitus, change in hearing NOSE: No congestion, drainage or bleeding  MOUTH/THROAT: No mouth or tooth pain, No sore throat RESPIRATORY: No cough, wheezing, SOB CARDIAC: No chest pain, palpitations, lower extremity edema  GI: No abdominal pain, No N/V/D or constipation, No heartburn or reflux  GU: No dysuria, frequency or urgency, or incontinence  MUSCULOSKELETAL: No unrelieved bone/joint pain NEUROLOGIC: No headache, dizziness or focal weakness PSYCHIATRIC: No overt anxiety or sadness. Sleeps well. No behavior issue.   Filed Vitals:   01/26/14 1413  BP: 112/82  Pulse: 84  Temp: 100.1 F (37.8 C)  Resp: 18    Physical Exam  GENERAL APPEARANCE: Alert, conversant. Appropriately groomed. No acute distress.  SKIN: No diaphoresis rash; wound entire medial leg revealing underlying structures, as if it has stopped and aged in that condition HEAD: Normocephalic, atraumatic  EYES: Conjunctiva/lids clear. Pupils round, reactive. EOMs intact.  EARS: External exam WNL, canals clear. Hearing grossly normal.  NOSE: No deformity or discharge.  MOUTH/THROAT: Lips w/o lesions  RESPIRATORY: Breathing is even, unlabored. Lung sounds are clear   CARDIOVASCULAR: Heart RRR no murmurs, rubs or gallops. No peripheral edema.  GASTROINTESTINAL: Abdomen is soft, non-tender, not distended w/ normal bowel sounds GENITOURINARY: Bladder non tender, not distended  MUSCULOSKELETAL: No abnormal joints or musculature except for L leg NEUROLOGIC:  Cranial nerves 2-12 grossly intact.  Moves all extremities no tremor. PSYCHIATRIC: surprisingly unconcerned about wound, no behavioral issues  Patient Active Problem List   Diagnosis Date Noted  . Cardiomyopathy, ischemic 01/26/2014  . Sepsis 01/17/2014  . Infected open wound 01/17/2014  . Acute encephalopathy 01/17/2014  . Generalized weakness 01/01/2014  . Depression 01/01/2014  . Renovascular hypertension 12/28/2013  . Hemodialysis patient 12/28/2013  . Secondary hyperparathyroidism 12/20/2013  . Calciphylaxis of left lower extremity with nonhealing ulcer 12/16/2013  . Left leg pain- due to calciphylaxis 12/16/2013  . CAD in native artery 12/16/2013  . Malaise and fatigue 12/13/2013  . Chest pain 12/13/2013  . Diabetes  mellitus due to underlying condition 09/19/2011  . HEADACHE 01/29/2009  . HYPERLIPIDEMIA 01/15/2009  . ANXIETY 01/15/2009  . MI 01/15/2009  . End stage renal disease on dialysis 01/15/2009  . COUGH 01/15/2009  . CEREBROVASCULAR ACCIDENT, HX OF 01/15/2009    CBC    Component Value Date/Time   WBC 13.4* 01/21/2014 1225   RBC 3.33* 01/21/2014 1225   RBC 3.67* 09/19/2011 1356   HGB 8.9* 01/21/2014 1225   HCT 28.6* 01/21/2014 1225   PLT 355 01/21/2014 1225   MCV 85.9 01/21/2014 1225   LYMPHSABS 1.0 01/17/2014 0940   MONOABS 1.5* 01/17/2014 0940   EOSABS 0.1 01/17/2014 0940   BASOSABS 0.0 01/17/2014 0940    CMP     Component Value Date/Time   NA 131* 01/21/2014 1225   K 3.7 01/21/2014 1225   CL 88* 01/21/2014 1225   CO2 25 01/21/2014 1225   GLUCOSE 211* 01/21/2014 1225   BUN 20 01/21/2014 1225   CREATININE 4.75* 01/21/2014 1225   CALCIUM 8.7 01/21/2014 1225   PROT 7.4 01/17/2014 0940   ALBUMIN 1.8* 01/21/2014 1225   AST 21 01/17/2014 0940   ALT 8 01/17/2014 0940   ALKPHOS 94 01/17/2014 0940   BILITOT 0.9 01/17/2014 0940   GFRNONAA 11* 01/21/2014 1225   GFRAA 13* 01/21/2014 1225    Assessment and Plan  Calciphylaxis of left lower extremity with nonhealing ulcer Calciphylaxis of left lower extremity with  nonhealing ulcer  Patient was admitted for further workup, wound care consultation was obtained, please see wound care recommendations above. Patient was empirically treated with IV vancomycin and Zosyn. Wound culture shows MSSA and Citrobacter, hence antibiotics narrowed to levofloxacin per sensitivities. Plastic surgery was consulted and recommended local wound care   Acute encephalopathy - CT head without any acute findings, likely due to uremia from missed dialysis versus sepsis, underlying dementia. Appears to be at baseline now.       End stage renal disease on dialysis On hemodialysis MWF,   HYPERLIPIDEMIA continue statim  Cardiomyopathy, ischemic no reported chest pains, patient had minimally elevated troponin although decreased compared to recent admission. Patient had recent stress test done without reversible ischemia. He was continued on aspirin, Coreg and bidil. Continue hemodialysis for volume management.    Renovascular hypertension Continue coreg and bidil  Infected open wound Levaquin QOD for 2 weeks  Diabetes mellitus due to underlying condition A1c 6.3 on no meds; is on statin    Hennie Duos, MD

## 2014-01-26 NOTE — Assessment & Plan Note (Signed)
Continue coreg and bidil

## 2014-01-26 NOTE — Assessment & Plan Note (Signed)
Levaquin QOD for 2 weeks

## 2014-01-26 NOTE — Assessment & Plan Note (Signed)
continue statim

## 2014-01-26 NOTE — Assessment & Plan Note (Signed)
On hemodialysis MWF,

## 2014-01-26 NOTE — Assessment & Plan Note (Signed)
no reported chest pains, patient had minimally elevated troponin although decreased compared to recent admission. Patient had recent stress test done without reversible ischemia. He was continued on aspirin, Coreg and bidil. Continue hemodialysis for volume management.

## 2014-01-26 NOTE — Assessment & Plan Note (Signed)
Calciphylaxis of left lower extremity with nonhealing ulcer  Patient was admitted for further workup, wound care consultation was obtained, please see wound care recommendations above. Patient was empirically treated with IV vancomycin and Zosyn. Wound culture shows MSSA and Citrobacter, hence antibiotics narrowed to levofloxacin per sensitivities. Plastic surgery was consulted and recommended local wound care

## 2014-01-26 NOTE — Assessment & Plan Note (Addendum)
A1c 6.3 on no meds; is on statin

## 2014-01-27 ENCOUNTER — Inpatient Hospital Stay (HOSPITAL_COMMUNITY)
Admission: EM | Admit: 2014-01-27 | Discharge: 2014-02-09 | DRG: 853 | Disposition: A | Discharge status: Skilled Nursing Facility | Payer: Medicare Other | Attending: Internal Medicine | Admitting: Internal Medicine

## 2014-01-27 ENCOUNTER — Encounter (HOSPITAL_COMMUNITY): Payer: Self-pay | Admitting: Emergency Medicine

## 2014-01-27 ENCOUNTER — Emergency Department (HOSPITAL_COMMUNITY): Payer: Medicare Other

## 2014-01-27 DIAGNOSIS — L97921 Non-pressure chronic ulcer of unspecified part of left lower leg limited to breakdown of skin: Secondary | ICD-10-CM

## 2014-01-27 DIAGNOSIS — E08319 Diabetes mellitus due to underlying condition with unspecified diabetic retinopathy without macular edema: Secondary | ICD-10-CM

## 2014-01-27 DIAGNOSIS — K810 Acute cholecystitis: Secondary | ICD-10-CM | POA: Diagnosis not present

## 2014-01-27 DIAGNOSIS — R6520 Severe sepsis without septic shock: Secondary | ICD-10-CM

## 2014-01-27 DIAGNOSIS — A4902 Methicillin resistant Staphylococcus aureus infection, unspecified site: Secondary | ICD-10-CM | POA: Diagnosis present

## 2014-01-27 DIAGNOSIS — R6521 Severe sepsis with septic shock: Secondary | ICD-10-CM

## 2014-01-27 DIAGNOSIS — I2589 Other forms of chronic ischemic heart disease: Secondary | ICD-10-CM | POA: Diagnosis present

## 2014-01-27 DIAGNOSIS — M949 Disorder of cartilage, unspecified: Secondary | ICD-10-CM

## 2014-01-27 DIAGNOSIS — G8918 Other acute postprocedural pain: Secondary | ICD-10-CM | POA: Diagnosis not present

## 2014-01-27 DIAGNOSIS — E43 Unspecified severe protein-calorie malnutrition: Secondary | ICD-10-CM | POA: Diagnosis not present

## 2014-01-27 DIAGNOSIS — I447 Left bundle-branch block, unspecified: Secondary | ICD-10-CM | POA: Diagnosis present

## 2014-01-27 DIAGNOSIS — S91009A Unspecified open wound, unspecified ankle, initial encounter: Secondary | ICD-10-CM

## 2014-01-27 DIAGNOSIS — S81009A Unspecified open wound, unspecified knee, initial encounter: Secondary | ICD-10-CM | POA: Diagnosis present

## 2014-01-27 DIAGNOSIS — Z8673 Personal history of transient ischemic attack (TIA), and cerebral infarction without residual deficits: Secondary | ICD-10-CM | POA: Diagnosis not present

## 2014-01-27 DIAGNOSIS — N2581 Secondary hyperparathyroidism of renal origin: Secondary | ICD-10-CM | POA: Diagnosis present

## 2014-01-27 DIAGNOSIS — Z992 Dependence on renal dialysis: Secondary | ICD-10-CM | POA: Diagnosis not present

## 2014-01-27 DIAGNOSIS — F0390 Unspecified dementia without behavioral disturbance: Secondary | ICD-10-CM | POA: Diagnosis present

## 2014-01-27 DIAGNOSIS — L299 Pruritus, unspecified: Secondary | ICD-10-CM | POA: Diagnosis not present

## 2014-01-27 DIAGNOSIS — R4182 Altered mental status, unspecified: Secondary | ICD-10-CM | POA: Diagnosis not present

## 2014-01-27 DIAGNOSIS — I252 Old myocardial infarction: Secondary | ICD-10-CM | POA: Diagnosis not present

## 2014-01-27 DIAGNOSIS — A419 Sepsis, unspecified organism: Secondary | ICD-10-CM | POA: Diagnosis not present

## 2014-01-27 DIAGNOSIS — I214 Non-ST elevation (NSTEMI) myocardial infarction: Secondary | ICD-10-CM

## 2014-01-27 DIAGNOSIS — L97909 Non-pressure chronic ulcer of unspecified part of unspecified lower leg with unspecified severity: Secondary | ICD-10-CM

## 2014-01-27 DIAGNOSIS — D62 Acute posthemorrhagic anemia: Secondary | ICD-10-CM | POA: Diagnosis not present

## 2014-01-27 DIAGNOSIS — N186 End stage renal disease: Secondary | ICD-10-CM | POA: Diagnosis present

## 2014-01-27 DIAGNOSIS — I2510 Atherosclerotic heart disease of native coronary artery without angina pectoris: Secondary | ICD-10-CM | POA: Diagnosis present

## 2014-01-27 DIAGNOSIS — G609 Hereditary and idiopathic neuropathy, unspecified: Secondary | ICD-10-CM | POA: Diagnosis present

## 2014-01-27 DIAGNOSIS — S81809A Unspecified open wound, unspecified lower leg, initial encounter: Secondary | ICD-10-CM | POA: Diagnosis present

## 2014-01-27 DIAGNOSIS — T148XXA Other injury of unspecified body region, initial encounter: Secondary | ICD-10-CM

## 2014-01-27 DIAGNOSIS — K819 Cholecystitis, unspecified: Secondary | ICD-10-CM

## 2014-01-27 DIAGNOSIS — E119 Type 2 diabetes mellitus without complications: Secondary | ICD-10-CM | POA: Diagnosis present

## 2014-01-27 DIAGNOSIS — R509 Fever, unspecified: Secondary | ICD-10-CM

## 2014-01-27 DIAGNOSIS — Z87891 Personal history of nicotine dependence: Secondary | ICD-10-CM

## 2014-01-27 DIAGNOSIS — Z23 Encounter for immunization: Secondary | ICD-10-CM | POA: Diagnosis not present

## 2014-01-27 DIAGNOSIS — I120 Hypertensive chronic kidney disease with stage 5 chronic kidney disease or end stage renal disease: Secondary | ICD-10-CM | POA: Diagnosis present

## 2014-01-27 DIAGNOSIS — K219 Gastro-esophageal reflux disease without esophagitis: Secondary | ICD-10-CM | POA: Diagnosis present

## 2014-01-27 DIAGNOSIS — I749 Embolism and thrombosis of unspecified artery: Secondary | ICD-10-CM

## 2014-01-27 DIAGNOSIS — I219 Acute myocardial infarction, unspecified: Secondary | ICD-10-CM

## 2014-01-27 DIAGNOSIS — D638 Anemia in other chronic diseases classified elsewhere: Secondary | ICD-10-CM | POA: Diagnosis present

## 2014-01-27 DIAGNOSIS — M899 Disorder of bone, unspecified: Secondary | ICD-10-CM | POA: Diagnosis present

## 2014-01-27 DIAGNOSIS — Z79899 Other long term (current) drug therapy: Secondary | ICD-10-CM | POA: Diagnosis not present

## 2014-01-27 DIAGNOSIS — I739 Peripheral vascular disease, unspecified: Secondary | ICD-10-CM | POA: Diagnosis present

## 2014-01-27 DIAGNOSIS — F329 Major depressive disorder, single episode, unspecified: Secondary | ICD-10-CM | POA: Diagnosis present

## 2014-01-27 DIAGNOSIS — X58XXXA Exposure to other specified factors, initial encounter: Secondary | ICD-10-CM | POA: Diagnosis present

## 2014-01-27 DIAGNOSIS — I7090 Unspecified atherosclerosis: Secondary | ICD-10-CM

## 2014-01-27 DIAGNOSIS — I743 Embolism and thrombosis of arteries of the lower extremities: Secondary | ICD-10-CM | POA: Insufficient documentation

## 2014-01-27 DIAGNOSIS — F411 Generalized anxiety disorder: Secondary | ICD-10-CM | POA: Diagnosis present

## 2014-01-27 DIAGNOSIS — S81802A Unspecified open wound, left lower leg, initial encounter: Secondary | ICD-10-CM

## 2014-01-27 DIAGNOSIS — I248 Other forms of acute ischemic heart disease: Secondary | ICD-10-CM

## 2014-01-27 DIAGNOSIS — I2489 Other forms of acute ischemic heart disease: Secondary | ICD-10-CM

## 2014-01-27 DIAGNOSIS — E785 Hyperlipidemia, unspecified: Secondary | ICD-10-CM | POA: Diagnosis present

## 2014-01-27 DIAGNOSIS — F3289 Other specified depressive episodes: Secondary | ICD-10-CM | POA: Diagnosis present

## 2014-01-27 DIAGNOSIS — I255 Ischemic cardiomyopathy: Secondary | ICD-10-CM

## 2014-01-27 DIAGNOSIS — E089 Diabetes mellitus due to underlying condition without complications: Secondary | ICD-10-CM

## 2014-01-27 DIAGNOSIS — Z7982 Long term (current) use of aspirin: Secondary | ICD-10-CM | POA: Diagnosis not present

## 2014-01-27 DIAGNOSIS — L089 Local infection of the skin and subcutaneous tissue, unspecified: Secondary | ICD-10-CM

## 2014-01-27 DIAGNOSIS — G9340 Encephalopathy, unspecified: Secondary | ICD-10-CM

## 2014-01-27 DIAGNOSIS — L98499 Non-pressure chronic ulcer of skin of other sites with unspecified severity: Secondary | ICD-10-CM

## 2014-01-27 DIAGNOSIS — L97923 Non-pressure chronic ulcer of unspecified part of left lower leg with necrosis of muscle: Secondary | ICD-10-CM

## 2014-01-27 DIAGNOSIS — E440 Moderate protein-calorie malnutrition: Secondary | ICD-10-CM | POA: Insufficient documentation

## 2014-01-27 DIAGNOSIS — M129 Arthropathy, unspecified: Secondary | ICD-10-CM | POA: Diagnosis present

## 2014-01-27 HISTORY — DX: Migraine, unspecified, not intractable, without status migrainosus: G43.909

## 2014-01-27 HISTORY — DX: Unspecified dementia, unspecified severity, without behavioral disturbance, psychotic disturbance, mood disturbance, and anxiety: F03.90

## 2014-01-27 HISTORY — DX: Other disorders of calcium metabolism: E83.59

## 2014-01-27 HISTORY — DX: Dependence on renal dialysis: Z99.2

## 2014-01-27 HISTORY — DX: End stage renal disease: N18.6

## 2014-01-27 HISTORY — DX: Left bundle-branch block, unspecified: I44.7

## 2014-01-27 HISTORY — DX: Atherosclerotic heart disease of native coronary artery without angina pectoris: I25.10

## 2014-01-27 HISTORY — DX: Anemia, unspecified: D64.9

## 2014-01-27 LAB — CBC WITH DIFFERENTIAL/PLATELET
BASOS PCT: 0 % (ref 0–1)
Basophils Absolute: 0 10*3/uL (ref 0.0–0.1)
EOS PCT: 1 % (ref 0–5)
Eosinophils Absolute: 0.1 10*3/uL (ref 0.0–0.7)
HEMATOCRIT: 27.4 % — AB (ref 39.0–52.0)
Hemoglobin: 8.5 g/dL — ABNORMAL LOW (ref 13.0–17.0)
Lymphocytes Relative: 8 % — ABNORMAL LOW (ref 12–46)
Lymphs Abs: 0.9 10*3/uL (ref 0.7–4.0)
MCH: 26.7 pg (ref 26.0–34.0)
MCHC: 31 g/dL (ref 30.0–36.0)
MCV: 86.2 fL (ref 78.0–100.0)
MONO ABS: 1.8 10*3/uL — AB (ref 0.1–1.0)
Monocytes Relative: 16 % — ABNORMAL HIGH (ref 3–12)
Neutro Abs: 8.1 10*3/uL — ABNORMAL HIGH (ref 1.7–7.7)
Neutrophils Relative %: 75 % (ref 43–77)
Platelets: 295 10*3/uL (ref 150–400)
RBC: 3.18 MIL/uL — ABNORMAL LOW (ref 4.22–5.81)
RDW: 18 % — AB (ref 11.5–15.5)
WBC: 10.9 10*3/uL — ABNORMAL HIGH (ref 4.0–10.5)

## 2014-01-27 LAB — I-STAT ARTERIAL BLOOD GAS, ED
Acid-Base Excess: 8 mmol/L — ABNORMAL HIGH (ref 0.0–2.0)
BICARBONATE: 32 meq/L — AB (ref 20.0–24.0)
O2 Saturation: 97 %
PO2 ART: 82 mmHg (ref 80.0–100.0)
Patient temperature: 99.7
TCO2: 33 mmol/L (ref 0–100)
pCO2 arterial: 41.1 mmHg (ref 35.0–45.0)
pH, Arterial: 7.501 — ABNORMAL HIGH (ref 7.350–7.450)

## 2014-01-27 LAB — COMPREHENSIVE METABOLIC PANEL
ALBUMIN: 1.9 g/dL — AB (ref 3.5–5.2)
ALT: 9 U/L (ref 0–53)
AST: 28 U/L (ref 0–37)
Alkaline Phosphatase: 85 U/L (ref 39–117)
Anion gap: 12 (ref 5–15)
BUN: 13 mg/dL (ref 6–23)
CALCIUM: 8.7 mg/dL (ref 8.4–10.5)
CO2: 29 mEq/L (ref 19–32)
CREATININE: 4.8 mg/dL — AB (ref 0.50–1.35)
Chloride: 94 mEq/L — ABNORMAL LOW (ref 96–112)
GFR calc Af Amer: 12 mL/min — ABNORMAL LOW (ref >90)
GFR calc non Af Amer: 11 mL/min — ABNORMAL LOW (ref >90)
Glucose, Bld: 117 mg/dL — ABNORMAL HIGH (ref 70–99)
Potassium: 4.3 mEq/L (ref 3.7–5.3)
Sodium: 135 mEq/L — ABNORMAL LOW (ref 137–147)
Total Bilirubin: 0.6 mg/dL (ref 0.3–1.2)
Total Protein: 7.3 g/dL (ref 6.0–8.3)

## 2014-01-27 LAB — I-STAT CG4 LACTIC ACID, ED: LACTIC ACID, VENOUS: 0.65 mmol/L (ref 0.5–2.2)

## 2014-01-27 MED ORDER — SODIUM CHLORIDE 0.9 % IV SOLN
2000.0000 mg | Freq: Once | INTRAVENOUS | Status: AC
  Administered 2014-01-27: 2000 mg via INTRAVENOUS
  Filled 2014-01-27: qty 2000

## 2014-01-27 MED ORDER — SODIUM CHLORIDE 0.9 % IV BOLUS (SEPSIS)
500.0000 mL | Freq: Once | INTRAVENOUS | Status: AC
  Administered 2014-01-27: 500 mL via INTRAVENOUS

## 2014-01-27 MED ORDER — PIPERACILLIN-TAZOBACTAM 3.375 G IVPB 30 MIN
3.3750 g | Freq: Once | INTRAVENOUS | Status: AC
  Administered 2014-01-28: 3.375 g via INTRAVENOUS
  Filled 2014-01-27: qty 50

## 2014-01-27 MED ORDER — SODIUM CHLORIDE 0.9 % IV BOLUS (SEPSIS): 1000.0000 mL | Freq: Once | INTRAVENOUS | Status: DC

## 2014-01-27 MED ORDER — HYDROMORPHONE HCL PF 1 MG/ML IJ SOLN
1.0000 mg | INTRAMUSCULAR | Status: DC | PRN
  Administered 2014-01-27 – 2014-02-03 (×10): 1 mg via INTRAVENOUS
  Filled 2014-01-27 (×10): qty 1

## 2014-01-27 MED ORDER — SODIUM CHLORIDE 0.9 % IV SOLN
Freq: Once | INTRAVENOUS | Status: AC
  Administered 2014-01-27: 22:00:00 via INTRAVENOUS

## 2014-01-27 MED ORDER — PIPERACILLIN-TAZOBACTAM IN DEX 2-0.25 GM/50ML IV SOLN
2.2500 g | Freq: Three times a day (TID) | INTRAVENOUS | Status: DC
  Administered 2014-01-28 – 2014-02-07 (×29): 2.25 g via INTRAVENOUS
  Filled 2014-01-27 (×38): qty 50

## 2014-01-27 MED ORDER — VANCOMYCIN HCL IN DEXTROSE 1-5 GM/200ML-% IV SOLN
1000.0000 mg | INTRAVENOUS | Status: DC
  Administered 2014-01-28 – 2014-01-30 (×2): 1000 mg via INTRAVENOUS
  Filled 2014-01-27 (×3): qty 200

## 2014-01-27 NOTE — ED Notes (Signed)
Per PTAR pt from [facility], they called 911 due to an elevated fever of 100.4, ongoing wound to LLE. PTAR was unable to palpate a pulse to the LLE, no report of this received from [facility] staff. VSS SBP 128, HR 96, O2 sats 86% on RA

## 2014-01-27 NOTE — ED Notes (Signed)
Failed IV attempt in the right hand.

## 2014-01-27 NOTE — H&P (Signed)
[facility]                                                                                    Patient Demographics  Sean [name], is a 78 y.o. male  CSN: [id number]  MRN: [id number]  DOB - Mar 14, 1936  Admit Date - 01/27/2014  Outpatient Primary MD for the patient is Philis Fendt, MD   With History of -  Past Medical History  Diagnosis Date  . Hyperparathyroidism   . Arthritis   . Hyperlipidemia   . Hypertension   . Neuromuscular disorder   . Stroke   . GERD (gastroesophageal reflux disease)   . Headache(784.0)     occasional  migraine   . Heart attack 2011; 1990's  . Type II diabetes mellitus   . Peripheral neuropathy   . Anxiety   . Depression   . Dialysis patient     M, W, F; [place] (09/19/11)  . Chronic kidney disease     Hemo MWF      Past Surgical History  Procedure Laterality Date  . Colon surgery  2011    colon resection   . Parathyroidectomy  2011  . Capd insertion  09/12/2011    Procedure: LAPAROSCOPIC INSERTION CONTINUOUS AMBULATORY PERITONEAL DIALYSIS  (CAPD) CATHETER;  Surgeon: Edward Jolly, MD;  Location: [place];  Service: General;  Laterality: N/A;  . Sp av dialysis shunt access existing *l*  2003  . Dg av dialysis shunt intro ndl*r* or      "not working" (09/19/11)  . Capd removal  03/12/2012    Procedure: CONTINUOUS AMBULATORY PERITONEAL DIALYSIS  (CAPD) CATHETER REMOVAL;  Surgeon: Edward Jolly, MD;  Location: [place];  Service: General;  Laterality: N/A;    in for   Chief Complaint  Patient presents with  . Fever  . Wound Infection     HPI  Sean [name]  is a 78 y.o. male, with past medical history significant for end-stage renal disease with hyperparathyroidism and severe calciphylaxis presenting today for evaluation of an advanced left leg wound . The patient was admitted and treated for right leg cellulitis and sepsis and was discharged 8 days ago. Patient was started on vancomycin and Zosyn in  the emergency room after he was noted to have a temperature of 100.1. Patient is confused and cannot give any significant history. He has a strange perception of his left leg ulcer. He has history of coronary artery disease and was admitted on July 1 of this year with a cardiac event. Nuclear stress test did not show any ischemia but showed an ejection fraction of 25%    Review of Systems   Unable to obtain due to patient's condition  Social History History  Substance Use Topics  . Smoking status: Former Smoker -- 0.25 packs/day for 7 years    Types: Cigarettes    Quit date: 06/12/1976  . Smokeless tobacco: Never Used  . Alcohol Use: No     Family History Family History  Problem Relation Age of Onset  . Cancer Sister     breast  . Cancer Brother     lung  .  Anesthesia problems Neg Hx   . Hypotension Neg Hx   . Malignant hyperthermia Neg Hx   . Pseudochol deficiency Neg Hx      Prior to Admission medications   Medication Sig Start Date End Date Taking? Authorizing Provider  acetaminophen (TYLENOL) 325 MG tablet Take 2 tablets (650 mg total) by mouth every 6 (six) hours as needed for mild pain, fever or headache. 12/24/13  Yes Debbe Odea, MD  albuterol (PROVENTIL) (2.5 MG/3ML) 0.083% nebulizer solution Take 3 mLs (2.5 mg total) by nebulization every 6 (six) hours as needed for wheezing or shortness of breath. 01/21/14  Yes Ripudeep Krystal Eaton, MD  aspirin 325 MG tablet Take 325 mg by mouth every evening.   Yes Historical Provider, MD  atorvastatin (LIPITOR) 20 MG tablet Take 20 mg by mouth at bedtime.   Yes Historical Provider, MD  b complex-vitamin c-folic acid (NEPHRO-VITE) 0.8 MG TABS Take 0.8 mg by mouth at bedtime.   Yes Historical Provider, MD  carvedilol (COREG) 3.125 MG tablet Take 1 tablet (3.125 mg total) by mouth 2 (two) times daily with a meal. 12/16/13  Yes Debbe Odea, MD  collagenase (SANTYL) ointment Apply 1 application topically daily. 01/21/14  Yes Ripudeep Krystal Eaton, MD   HYDROcodone-acetaminophen (NORCO/VICODIN) 5-325 MG per tablet Take 1 tablet by mouth every 6 (six) hours as needed for moderate pain or severe pain. 01/22/14  Yes Tiffany L Reed, DO  hydrOXYzine (ATARAX/VISTARIL) 10 MG tablet Take 10 mg by mouth at bedtime as needed (for sleep).    Yes Historical Provider, MD  isosorbide-hydrALAZINE (BIDIL) 20-37.5 MG per tablet Take 1 tablet by mouth 2 (two) times daily. 12/16/13  Yes Debbe Odea, MD  levofloxacin (LEVAQUIN) 500 MG tablet Take 1 tablet (500 mg total) by mouth every other day. X 2 weeks 01/21/14  Yes Ripudeep Krystal Eaton, MD  PARoxetine (PAXIL) 20 MG tablet Take 1 tablet (20 mg total) by mouth daily. 01/21/14  Yes Ripudeep Krystal Eaton, MD  feeding supplement, RESOURCE BREEZE, (RESOURCE BREEZE) LIQD Take 1 Container by mouth 2 (two) times daily between meals. 01/21/14   Ripudeep Krystal Eaton, MD    Allergies  Allergen Reactions  . Benazepril Hcl Hives, Itching and Rash    flareups on patient head    Physical Exam  Vitals  Blood pressure 124/64, pulse 94, temperature 99.7 F (37.6 C), temperature source Rectal, resp. rate 23, height 5' 4.96" (1.65 m), weight 82.5 kg (181 lb 14.1 oz), SpO2 100.00%.   1. General elderly African American gentleman in no acute distress at the time  2. Normal affect and insight, Not Suicidal or Homicidal, confused .  3. No obvious F.N deficits,  4. Ears and Eyes appear Normal, Conjunctivae clear, PERRLA. Moist Oral Mucosa.  5. Supple Neck, No JVD, No cervical lymphadenopathy appriciated, No Carotid Bruits.  6. Symmetrical Chest wall movement, Good air movement bilaterally, CTAB.  7. RRR, No Gallops, Rubs or Murmurs, No Parasternal Heave.  8. Positive Bowel Sounds, Abdomen Soft, Non tender, No organomegaly appriciated,No rebound -guarding or rigidity.  9.  No Cyanosis, Normal Skin Turgor, No Skin Rash or Bruise.  10. Good muscle tone,  mildly contracted left lower extremity with an unstageable left leg ulcer that extends  from the knee down to the ankle ,oozing and erythematous with visible bone. Pulsating left arm shunt noted for dialysis. Absent dorsalis pedis and posterior tibialis bilaterally with healing scars on the right leg .  11. No Palpable Lymph Nodes in Neck or  Axillae    Data Review  CBC  Recent Labs Lab 01/21/14 1225 01/27/14 2100  WBC 13.4* 10.9*  HGB 8.9* 8.5*  HCT 28.6* 27.4*  PLT 355 295  MCV 85.9 86.2  MCH 26.7 26.7  MCHC 31.1 31.0  RDW 17.0* 18.0*  LYMPHSABS  --  0.9  MONOABS  --  1.8*  EOSABS  --  0.1  BASOSABS  --  0.0   ------------------------------------------------------------------------------------------------------------------  Chemistries   Recent Labs Lab 01/21/14 1225 01/27/14 2100  NA 131* 135*  K 3.7 4.3  CL 88* 94*  CO2 25 29  GLUCOSE 211* 117*  BUN 20 13  CREATININE 4.75* 4.80*  CALCIUM 8.7 8.7  AST  --  28  ALT  --  9  ALKPHOS  --  85  BILITOT  --  0.6   ------------------------------------------------------------------------------------------------------------------ estimated creatinine clearance is 12.9 ml/min (by C-G formula based on Cr of 4.8). ------------------------------------------------------------------------------------------------------------------ No results found for this basename: TSH, T4TOTAL, FREET3, T3FREE, THYROIDAB,  in the last 72 hours   Coagulation profile No results found for this basename: INR, PROTIME,  in the last 168 hours ------------------------------------------------------------------------------------------------------------------- No results found for this basename: DDIMER,  in the last 72 hours -------------------------------------------------------------------------------------------------------------------  Cardiac Enzymes No results found for this basename: CK, CKMB, TROPONINI, MYOGLOBIN,  in the last 168  hours ------------------------------------------------------------------------------------------------------------------ No components found with this basename: POCBNP,    ---------------------------------------------------------------------------------------------------------------  Urinalysis No results found for this basename: colorurine, appearanceur, labspec, phurine, glucoseu, hgbur, bilirubinur, ketonesur, proteinur, urobilinogen, nitrite, leukocytesur    ----------------------------------------------------------------------------------------------------------------  .   Imaging results:   Ct Head Wo Contrast  01/17/2014   CLINICAL DATA:  Altered mental status.  Weakness.  Dialysis patient.  EXAM: CT HEAD WITHOUT CONTRAST  TECHNIQUE: Contiguous axial images were obtained from the base of the skull through the vertex without intravenous contrast.  COMPARISON:  12/13/2013.  FINDINGS: Stable cerebral atrophy, ventriculomegaly and periventricular white matter disease. Remote left occipital infarct with encephalomalacia ex vacuo dilatation of the occipital horn of the left lateral ventricle. No extra-axial fluid collections are identified. No CT findings for acute hemispheric infarction or intracranial hemorrhage. No mass lesions. The brainstem and cerebellum are grossly normal and stable.  Stable dense vascular calcifications. The bony structures are intact. No fracture or bone lesion. The paranasal sinuses and mastoid air cells are clear. The globes are intact.  IMPRESSION: Stable atrophy, ventriculomegaly and periventricular white matter disease.  Remote left occipital infarct.  No acute intracranial findings or mass lesions.   Electronically Signed   By: Kalman Jewels M.D.   On: 01/17/2014 08:28   Dg Chest Port 1 View  01/27/2014   CLINICAL DATA:  Fever.  Wound infection.  EXAM: PORTABLE CHEST - 1 VIEW  COMPARISON:  01/17/2014  FINDINGS: There is tortuosity and calcification of the  thoracic aorta. There are no infiltrates or effusions. Aeration has improved bilaterally. No acute osseous abnormality.  IMPRESSION: Resolution of interstitial edema.   Electronically Signed   By: Rozetta Nunnery M.D.   On: 01/27/2014 19:30   Dg Chest Port 1 View  01/17/2014   CLINICAL DATA:  Weakness.  History of end-stage renal disease.  EXAM: PORTABLE CHEST - 1 VIEW  COMPARISON:  12/20/2013  FINDINGS: There is stable mild cardiac enlargement. Lungs show evidence of mild interstitial edema. There is no evidence of airspace consolidation, pneumothorax, nodule or pleural fluid.  IMPRESSION: Cardiomegaly and mild interstitial edema.   Electronically Signed   By: Jenness Corner.D.  On: 01/17/2014 09:41      Assessment & Plan  1. Left leg calciphylalactic unstageable open wound between the knee and the ankle showing bone and muscles was absent to ounces of the dorsalis pedis and the posterior tibialis.      Seen by Dr. Oneida Alar in the emergency room who advised above knee amputation which is very reasonable in my opinion, family still didn't make the decision Continue IV antibiotics vancomycin and Zosyn 2. Cardiac wise the patient has coronary artery disease with multiple MIs in the past and nuclear test done on 7/5 showed no ischemia but an ejection fraction of 25%. 3. End-stage on hemodialysis followed by. center [place] surgery and is scheduled for Monday Wednesday and Friday Nephrology contacted and Dr. Baird Cancer will see the patient in a.m. This patient will definitely be high risk for surgery however without it he will probably die 4, advanced dementia the extremity attributes to pain medications however patient has calciphylaxis and this is needed patient has a very strange but happy attitude about his situation  DVT Prophylaxis Heparin   AM Labs Ordered, also please review Full Orders  Family Communication: Admission, patients condition and plan of care including tests being ordered have  been discussed with the patient and daughter who indicate understanding and agree with the plan and Code Status.  Code Status full  Disposition Plan: Nursing home  Time spent in minutes : 42 minutes  Condition GUARDED   @SIGNATURE @

## 2014-01-27 NOTE — ED Provider Notes (Signed)
Medical screening examination/treatment/procedure(s) were conducted as a shared visit with non-physician practitioner(s) and myself.  I personally evaluated the patient during the encounter.   EKG Interpretation None      Emergency Ultrasound Study:   Angiocath insertion Performed by: Serita Grit DAVID III  Consent: Verbal consent obtained. Risks and benefits: risks, benefits and alternatives were discussed Immediately prior to procedure the correct patient, procedure, equipment, support staff and site/side marked as needed.  Indication: difficult IV access Preparation: Patient was prepped and draped in the usual sterile fashion. Vein Location: left EJ vein was visualized during assessment for potential access sites and was found to be patent/ easily compressed with linear ultrasound.  The needle was visualized with real-time ultrasound and guided into the vein. Gauge: 20g  Image saved and stored.  Normal blood return.  Flushed easily.  Patient tolerance: Patient tolerated the procedure well with no immediate complications.  CRITICAL CARE Performed by: Serita Grit DAVID III   Total critical care time: 35  Critical care time was exclusive of separately billable procedures and treating other patients.  Critical care was necessary to treat or prevent imminent or life-threatening deterioration.  Critical care was time spent personally by me on the following activities: development of treatment plan with patient and/or surrogate as well as nursing, discussions with consultants, evaluation of patient's response to treatment, examination of patient, obtaining history from patient or surrogate, ordering and performing treatments and interventions, ordering and review of laboratory studies, ordering and review of radiographic studies, pulse oximetry and re-evaluation of patient's condition.     78 yo male with recent hospitalization for sepsis secondary to wound infection presenting  with fevers, left leg pain, and confusion.  On exam, ill appearance, sleepy but easily arousable, heart sounds normal w RRR, lungs CTAB with mild tachypnea, left lower leg has large wound with areas of necrosis and areas with purulent drainage, no palpable pulses in left foot.  Obtaining access was difficult, but a left EJ was placed by me under ultrasound guidance (pt appeared fluid depleted with easily collapsible veins.  Fevers likely secondary to wound infection.  Vascular surgery consult obtained, felt that leg would likely need BKA.  Plan admit to medicine.    Arbie Cookey, MD 01/27/14 [phone]

## 2014-01-27 NOTE — ED Notes (Signed)
Spoke with Vaughan Basta from IV Team, advised she would come as soon as she can to attempt IV access.

## 2014-01-27 NOTE — Consult Note (Signed)
ANTIBIOTIC CONSULT NOTE - INITIAL  Pharmacy Consult for Vancomycin and Zosyn Indication: sepsis  Allergies  Allergen Reactions  . Benazepril Hcl Hives, Itching and Rash    flareups on patient head    Patient Measurements: Height: 5' 4.96" (165 cm) Weight: 181 lb 14.1 oz (82.5 kg) IBW/kg (Calculated) : 61.41  Vital Signs: Temp: 99.7 F (37.6 C) (08/18 1735) Temp src: Rectal (08/18 1735) BP: 130/74 mmHg (08/18 1735) Pulse Rate: 83 (08/18 1735) Intake/Output from previous day:   Intake/Output from this shift:    Labs: No results found for this basename: WBC, HGB, PLT, LABCREA, CREATININE,  in the last 72 hours Estimated Creatinine Clearance: 13.1 ml/min (by C-G formula based on Cr of 4.75). No results found for this basename: VANCOTROUGH, VANCOPEAK, VANCORANDOM, GENTTROUGH, GENTPEAK, GENTRANDOM, TOBRATROUGH, TOBRAPEAK, TOBRARND, AMIKACINPEAK, AMIKACINTROU, AMIKACIN,  in the last 72 hours   Microbiology: Recent Results (from the past 720 hour(s))  WOUND CULTURE     Status: None   Collection Time    01/17/14  8:05 AM      Result Value Ref Range Status   Specimen Description LEG LEFT   Final   Special Requests NONE   Final   Gram Stain     Final   Value: RARE SQUAMOUS EPITHELIAL CELLS PRESENT     MODERATE WBC PRESENT, PREDOMINANTLY PMN     FEW GRAM POSITIVE COCCI IN CLUSTERS     MODERATE GRAM NEGATIVE RODS     RARE GRAM POSITIVE RODS   Culture     Final   Value: MODERATE STAPHYLOCOCCUS AUREUS     Note: RIFAMPIN AND GENTAMICIN SHOULD NOT BE USED AS SINGLE DRUGS FOR TREATMENT OF STAPH INFECTIONS.     MODERATE CITROBACTER AMALONATICUS     Performed at [facility]   Report Status 01/21/2014 FINAL   Final   Organism ID, Bacteria STAPHYLOCOCCUS AUREUS   Final   Organism ID, Bacteria CITROBACTER AMALONATICUS   Final  CULTURE, BLOOD (ROUTINE X 2)     Status: None   Collection Time    01/17/14  9:30 AM      Result Value Ref Range Status   Specimen Description BLOOD  RIGHT ARM   Final   Special Requests     Final   Value: BOTTLES DRAWN AEROBIC AND ANAEROBIC 10CC BLUE, 5CC RED   Culture  Setup Time     Final   Value: 01/17/2014 20:29     Performed at [facility]   Culture     Final   Value: NO GROWTH 5 DAYS     Performed at [facility]   Report Status 01/23/2014 FINAL   Final  CULTURE, BLOOD (ROUTINE X 2)     Status: None   Collection Time    01/17/14  9:40 AM      Result Value Ref Range Status   Specimen Description BLOOD RIGHT HAND   Final   Special Requests BOTTLES DRAWN AEROBIC ONLY [facility]   Final   Culture  Setup Time     Final   Value: 01/17/2014 20:29     Performed at [facility]   Culture     Final   Value: NO GROWTH 5 DAYS     Performed at [facility]   Report Status 01/23/2014 FINAL   Final  MRSA PCR SCREENING     Status: None   Collection Time    01/17/14  9:33 PM      Result Value  Ref Range Status   MRSA by PCR NEGATIVE  NEGATIVE Final   Comment:            The GeneXpert MRSA Assay (FDA     approved for NASAL specimens     only), is one component of a     comprehensive MRSA colonization     surveillance program. It is not     intended to diagnose MRSA     infection nor to guide or     monitor treatment for     MRSA infections.    Medical History: Past Medical History  Diagnosis Date  . Hyperparathyroidism   . Arthritis   . Hyperlipidemia   . Hypertension   . Neuromuscular disorder   . Stroke   . GERD (gastroesophageal reflux disease)   . Headache(784.0)     occasional  migraine   . Heart attack 2011; 1990's  . Type II diabetes mellitus   . Peripheral neuropathy   . Anxiety   . Depression   . Dialysis patient     M, W, F; [place] (09/19/11)  . Chronic kidney disease     Hemo MWF   Assessment: 78yom with ESRD on HD MWF was recently discharged from the hospital 01/21/14 after admission for early sepsis with enlarging left leg calciphylaxis wound that grew staph and  citrobacter (initially treated with vancomycin and zosyn, transitioned to levaquin at discharge). Returns to the ED today with fever, ongoing wound infection, and loss of pulses in his LLE. Level 2 code sepsis called. He will again begin vancomycin and zosyn.  Goal of Therapy:  Vancomycin pre-HD level 15-25  Plan:  1) Vancomycin 2g IV x 1 loading dose then 1g IV qHD 2) Zosyn 3.375g IV x  q8 3) Follow HD schedule, cultures, LOT, level if needed  Deboraha Sprang 01/27/2014,7:52 PM

## 2014-01-27 NOTE — Consult Note (Signed)
[facility]  HISTORY AND PHYSICAL  Requesting Consult: [place] Reason for consult: chronic leg wound with possible ischemia History of Present Illness:  Patient is a 78 y.o. year old male who presents for evaluation of chronic non healing leg ulcer with possible ischemia. The patient was discharged from the hospital 8 days ago for similar problems of early sepsis with enlarging right leg calciphylaxis wound.  The patient is demented and cannot provide any history.  The patient has not been ambulatory for the last 2 months and is in constant pain according to the daughter.  She believes some of his altered mental status may be secondary to pain meds and the wound.  Other medical problems include hyperparathyroid, hypertension, hyperlipid, diabetes, ESRD on dialysis MWF, CAD, prior stroke all currently stable.  Past Medical History  Diagnosis Date  . Hyperparathyroidism   . Arthritis   . Hyperlipidemia   . Hypertension   . Neuromuscular disorder   . Stroke   . GERD (gastroesophageal reflux disease)   . Headache(784.0)     occasional  migraine   . Heart attack 2011; 1990's  . Type II diabetes mellitus   . Peripheral neuropathy   . Anxiety   . Depression   . Dialysis patient     M, W, F; [place] (09/19/11)  . Chronic kidney disease     Hemo MWF    Past Surgical History  Procedure Laterality Date  . Colon surgery  2011    colon resection   . Parathyroidectomy  2011  . Capd insertion  09/12/2011    Procedure: LAPAROSCOPIC INSERTION CONTINUOUS AMBULATORY PERITONEAL DIALYSIS  (CAPD) CATHETER;  Surgeon: Edward Jolly, MD;  Location: [facility];  Service: General;  Laterality: N/A;  . Sp av dialysis shunt access existing *l*  2003  . Dg av dialysis shunt intro ndl*r* or      "not working" (09/19/11)  . Capd removal  03/12/2012    Procedure: CONTINUOUS AMBULATORY PERITONEAL DIALYSIS  (CAPD) CATHETER REMOVAL;  Surgeon: Edward Jolly, MD;  Location:  [facility];  Service: General;  Laterality: N/A;    Social History History  Substance Use Topics  . Smoking status: Former Smoker -- 0.25 packs/day for 7 years    Types: Cigarettes    Quit date: 06/12/1976  . Smokeless tobacco: Never Used  . Alcohol Use: No    Family History Family History  Problem Relation Age of Onset  . Cancer Sister     breast  . Cancer Brother     lung  . Anesthesia problems Neg Hx   . Hypotension Neg Hx   . Malignant hyperthermia Neg Hx   . Pseudochol deficiency Neg Hx     Allergies  Allergies  Allergen Reactions  . Benazepril Hcl Hives, Itching and Rash    flareups on patient head     Current Facility-Administered Medications  Medication Dose Route Frequency Provider Last Rate Last Dose  . sodium chloride 0.9 % bolus 1,000 mL  1,000 mL Intravenous Once Abigail Harris, PA-C      . sodium chloride 0.9 % bolus 500 mL  500 mL Intravenous Once Margarita Mail, PA-C       Current Outpatient Prescriptions  Medication Sig Dispense Refill  . acetaminophen (TYLENOL) 325 MG tablet Take 2 tablets (650 mg total) by mouth every 6 (six) hours as needed for mild pain, fever or headache.      . albuterol (PROVENTIL) (2.5 MG/3ML) 0.083% nebulizer  solution Take 3 mLs (2.5 mg total) by nebulization every 6 (six) hours as needed for wheezing or shortness of breath.  75 mL  12  . aspirin 325 MG tablet Take 325 mg by mouth every evening.      Marland Kitchen atorvastatin (LIPITOR) 20 MG tablet Take 20 mg by mouth at bedtime.      Marland Kitchen b complex-vitamin c-folic acid (NEPHRO-VITE) 0.8 MG TABS Take 0.8 mg by mouth at bedtime.      . carvedilol (COREG) 3.125 MG tablet Take 1 tablet (3.125 mg total) by mouth 2 (two) times daily with a meal.  30 tablet  0  . collagenase (SANTYL) ointment Apply 1 application topically daily.  30 g  3  . HYDROcodone-acetaminophen (NORCO/VICODIN) 5-325 MG per tablet Take 1 tablet by mouth every 6 (six) hours as needed for moderate pain or severe pain.  120 tablet   0  . hydrOXYzine (ATARAX/VISTARIL) 10 MG tablet Take 10 mg by mouth at bedtime as needed (for sleep).       . isosorbide-hydrALAZINE (BIDIL) 20-37.5 MG per tablet Take 1 tablet by mouth 2 (two) times daily.  30 tablet  0  . levofloxacin (LEVAQUIN) 500 MG tablet Take 1 tablet (500 mg total) by mouth every other day. X 2 weeks      . PARoxetine (PAXIL) 20 MG tablet Take 1 tablet (20 mg total) by mouth daily.  30 tablet  0  . feeding supplement, RESOURCE BREEZE, (RESOURCE BREEZE) LIQD Take 1 Container by mouth 2 (two) times daily between meals.    0    ROS:   Unable to obtain secondary to dementia   Physical Examination  Filed Vitals:   01/27/14 1735  BP: 130/74  Pulse: 83  Temp: 99.7 F (37.6 C)  TempSrc: Rectal  Resp: 22  SpO2: 99%    There is no weight on file to calculate BMI.  General:  Confused moans only HEENT: Normal Pulmonary: oxygen saturation 100% on supplemental oxygen, symmetric chest rise Cardiac: Regular Rate and Rhythm Abdomen: Soft, non-tender, non-distended Skin: No rash, 50 cm length wound circumferential full thickness with exposed bone extending from just below the knee to the ankle, no ulcers right foot Extremity Pulses:  2+  Femoral,absent popliteal dorsalis pedis, posterior tibial pulses bilaterally, pulsatile dialysis graft left arm Musculoskeletal: 10 degree left knee flexion contracture  Neurologic: moves upper and lower extremities  DATA: CBC    Component Value Date/Time   WBC 13.4* 01/21/2014 1225   RBC 3.33* 01/21/2014 1225   RBC 3.67* 09/19/2011 1356   HGB 8.9* 01/21/2014 1225   HCT 28.6* 01/21/2014 1225   PLT 355 01/21/2014 1225   MCV 85.9 01/21/2014 1225   MCH 26.7 01/21/2014 1225   MCHC 31.1 01/21/2014 1225   RDW 17.0* 01/21/2014 1225   LYMPHSABS 1.0 01/17/2014 0940   MONOABS 1.5* 01/17/2014 0940   EOSABS 0.1 01/17/2014 0940   BASOSABS 0.0 01/17/2014 0940     BMET    Component Value Date/Time   NA 131* 01/21/2014 1225   K 3.7 01/21/2014 1225    CL 88* 01/21/2014 1225   CO2 25 01/21/2014 1225   GLUCOSE 211* 01/21/2014 1225   BUN 20 01/21/2014 1225   CREATININE 4.75* 01/21/2014 1225   CALCIUM 8.7 01/21/2014 1225   GFRNONAA 11* 01/21/2014 1225   GFRAA 13* 01/21/2014 1225    ASSESSMENT:  Extensive necrotic wound left leg non salvageable, PAD right leg most likely SFA and tibial disease currently asymptomatic  PLAN:  Pt needs left AKA.  I discussed this with pt daughter who will consider it.  Would make pt NPO p midnight as we could do this tomorrow if daughter consents.  Ruta Hinds, MD [facility] [place] Office: [phone] Pager: [phone]

## 2014-01-27 NOTE — ED Provider Notes (Signed)
CSN: [id number]     Arrival date & time 01/27/14  1719 History   First MD Initiated Contact with Patient 01/27/14 1720     Chief Complaint  Patient presents with  . Fever  . Wound Infection     (Consider location/radiation/quality/duration/timing/severity/associated sxs/prior Treatment) HPI JUSTYN LANGHAM This 78 year old male with a past medical history of diabetes, hyperlipidemia, end-stage renal disease on dialysis for the past 18 years, and history of calciphylaxis. The patient was admitted to the hospital on 01/17/2014 for sepsis and a nonhealing leg wound. Review of EMR shows a consult with Dr. clearest finger recommended wound care. Patient was discharged 01/21/2014. He has been under the care of Dr. Charlann Noss, wound care specialist at his SNF, [facility]. He was found to be febrile today and hs left foot was without pulses. He was sent for evaluation by Dr Mariel Craft. He is attended by his daughters.   Past Medical History  Diagnosis Date  . Hyperparathyroidism   . Arthritis   . Hyperlipidemia   . Hypertension   . Neuromuscular disorder   . Stroke   . GERD (gastroesophageal reflux disease)   . Headache(784.0)     occasional  migraine   . Heart attack 2011; 1990's  . Type II diabetes mellitus   . Peripheral neuropathy   . Anxiety   . Depression   . Dialysis patient     M, W, F; [place] (09/19/11)  . Chronic kidney disease     Hemo MWF   Past Surgical History  Procedure Laterality Date  . Colon surgery  2011    colon resection   . Parathyroidectomy  2011  . Capd insertion  09/12/2011    Procedure: LAPAROSCOPIC INSERTION CONTINUOUS AMBULATORY PERITONEAL DIALYSIS  (CAPD) CATHETER;  Surgeon: Edward Jolly, MD;  Location: [facility];  Service: General;  Laterality: N/A;  . Sp av dialysis shunt access existing *l*  2003  . Dg av dialysis shunt intro ndl*r* or      "not working" (09/19/11)  . Capd removal  03/12/2012    Procedure: CONTINUOUS AMBULATORY PERITONEAL  DIALYSIS  (CAPD) CATHETER REMOVAL;  Surgeon: Edward Jolly, MD;  Location: [facility];  Service: General;  Laterality: N/A;   Family History  Problem Relation Age of Onset  . Cancer Sister     breast  . Cancer Brother     lung  . Anesthesia problems Neg Hx   . Hypotension Neg Hx   . Malignant hyperthermia Neg Hx   . Pseudochol deficiency Neg Hx    History  Substance Use Topics  . Smoking status: Former Smoker -- 0.25 packs/day for 7 years    Types: Cigarettes    Quit date: 06/12/1976  . Smokeless tobacco: Never Used  . Alcohol Use: No    Review of Systems  Unable to perform ROS     Allergies  Benazepril hcl  Home Medications   Prior to Admission medications   Medication Sig Start Date End Date Taking? Authorizing Provider  acetaminophen (TYLENOL) 325 MG tablet Take 2 tablets (650 mg total) by mouth every 6 (six) hours as needed for mild pain, fever or headache. 12/24/13  Yes Debbe Odea, MD  albuterol (PROVENTIL) (2.5 MG/3ML) 0.083% nebulizer solution Take 3 mLs (2.5 mg total) by nebulization every 6 (six) hours as needed for wheezing or shortness of breath. 01/21/14  Yes Ripudeep Krystal Eaton, MD  aspirin 325 MG tablet Take 325 mg by mouth every evening.   Yes Historical  Provider, MD  atorvastatin (LIPITOR) 20 MG tablet Take 20 mg by mouth at bedtime.   Yes Historical Provider, MD  b complex-vitamin c-folic acid (NEPHRO-VITE) 0.8 MG TABS Take 0.8 mg by mouth at bedtime.   Yes Historical Provider, MD  carvedilol (COREG) 3.125 MG tablet Take 1 tablet (3.125 mg total) by mouth 2 (two) times daily with a meal. 12/16/13  Yes Debbe Odea, MD  collagenase (SANTYL) ointment Apply 1 application topically daily. 01/21/14  Yes Ripudeep Krystal Eaton, MD  HYDROcodone-acetaminophen (NORCO/VICODIN) 5-325 MG per tablet Take 1 tablet by mouth every 6 (six) hours as needed for moderate pain or severe pain. 01/22/14  Yes Tiffany L Reed, DO  hydrOXYzine (ATARAX/VISTARIL) 10 MG tablet Take 10 mg by mouth at  bedtime as needed (for sleep).    Yes Historical Provider, MD  isosorbide-hydrALAZINE (BIDIL) 20-37.5 MG per tablet Take 1 tablet by mouth 2 (two) times daily. 12/16/13  Yes Debbe Odea, MD  levofloxacin (LEVAQUIN) 500 MG tablet Take 1 tablet (500 mg total) by mouth every other day. X 2 weeks 01/21/14  Yes Ripudeep Krystal Eaton, MD  PARoxetine (PAXIL) 20 MG tablet Take 1 tablet (20 mg total) by mouth daily. 01/21/14  Yes Ripudeep Krystal Eaton, MD  feeding supplement, RESOURCE BREEZE, (RESOURCE BREEZE) LIQD Take 1 Container by mouth 2 (two) times daily between meals. 01/21/14   Ripudeep Krystal Eaton, MD   BP 130/74  Pulse 83  Temp(Src) 99.7 F (37.6 C) (Rectal)  Resp 22  SpO2 99% Physical Exam CONSTITUTIONAL: Alert and oriented to person and place but not time, unable to answer questions appropriately, appears uncomfortable but nontoxic  HEAD: Normocephalic  EYES: Conjunctivae clear, PERRL  ENT: normal nose; no rhinorrhea; dry mucous membranes; pharynx without lesions noted  NECK: Supple, no meningismus, no LAD  CARD: Regular and tachycardic; S1 and S2 appreciated; no murmurs, no clicks, no rubs, no gallops  RESP: Normal chest excursion without splinting or tachypnea; it has mild bibasilar crackles, no rhonchi or wheezing, mild hypoxia on room air  ABD/GI: Normal bowel sounds; non-distended; soft, non-tender, no rebound, no guarding  BACK: The back appears normal and is non-tender to palpation, there is no CVA tenderness  EXT: Patient has an extremely large left lower extremity wound from his knee to his ankle with significant skin breakdown, visible tibia, foul odor and copious amounts of necrotic tissue,no obvious surrounding erythema the leg is warm, exquisetly TTP,Unable to palpate or doppler pulses in the left Foot, cool to touch and surrounding blackened skin. R foot with palpable pulses. SKIN: Normal color for age and race; warm  NEURO: Moves all extremities equally, cranial nerves II through XII intact,  sensation to light touch intact diffusely, no pronator drift  PSYCH: The patient's mood and manner are appropriate. Grooming and personal hygiene are appropriate.     ED Course  Procedures (including critical care time) Labs Review Labs Reviewed  CBC WITH DIFFERENTIAL - Abnormal; Notable for the following:    WBC 10.9 (*)    RBC 3.18 (*)    Hemoglobin 8.5 (*)    HCT 27.4 (*)    RDW 18.0 (*)    Neutro Abs 8.1 (*)    Lymphocytes Relative 8 (*)    Monocytes Relative 16 (*)    Monocytes Absolute 1.8 (*)    All other components within normal limits  COMPREHENSIVE METABOLIC PANEL - Abnormal; Notable for the following:    Sodium 135 (*)    Chloride 94 (*)  Glucose, Bld 117 (*)    Creatinine, Ser 4.80 (*)    Albumin 1.9 (*)    GFR calc non Af Amer 11 (*)    GFR calc Af Amer 12 (*)    All other components within normal limits  CBC - Abnormal; Notable for the following:    RBC 3.33 (*)    Hemoglobin 8.8 (*)    HCT 28.5 (*)    RDW 18.4 (*)    All other components within normal limits  BASIC METABOLIC PANEL - Abnormal; Notable for the following:    Sodium 134 (*)    Chloride 95 (*)    Glucose, Bld 108 (*)    Creatinine, Ser 5.16 (*)    GFR calc non Af Amer 10 (*)    GFR calc Af Amer 11 (*)    All other components within normal limits  PROTIME-INR - Abnormal; Notable for the following:    Prothrombin Time 16.9 (*)    All other components within normal limits  GLUCOSE, CAPILLARY - Abnormal; Notable for the following:    Glucose-Capillary 105 (*)    All other components within normal limits  I-STAT ARTERIAL BLOOD GAS, ED - Abnormal; Notable for the following:    pH, Arterial 7.501 (*)    Bicarbonate 32.0 (*)    Acid-Base Excess 8.0 (*)    All other components within normal limits  GLUCOSE, CAPILLARY  HEPATITIS B SURFACE ANTIGEN  I-STAT CG4 LACTIC ACID, ED    Imaging Review No results found.   EKG Interpretation None       CRITICAL CARE Performed by: Margarita Mail   Total critical care time: 45  Decreased mentation, pulseless, gangrenous limb, febrile, hypotensive.  Critical care time was exclusive of separately billable procedures and treating other patients.  Critical care was necessary to treat or prevent imminent or life-threatening deterioration.  Critical care was time spent personally by me on the following activities: development of treatment plan with patient and/or surrogate as well as nursing, discussions with consultants, evaluation of patient's response to treatment, examination of patient, obtaining history from patient or surrogate, ordering and performing treatments and interventions, ordering and review of laboratory studies, ordering and review of radiographic studies, pulse oximetry and re-evaluation of patient's condition.  MDM   Final diagnoses:  Open leg wound, left, initial encounter  Calciphylaxis with nonhealing ulcer of leg, left, with necrosis of muscle  Arterial occlusion  Other specified fever   Patient with fever, gangrenous appearing, pulsesless leg.  His work up was significantly delayed by extremely difficult IV access. Dr. Doy Mince place US guided EJ.  He is receiving vanc and zosyn.  BP improved with 500 cc bolus of NS. Dr> Fields has consulted here in the ED and feels the patient will need a left AKA. He has a leukocytosis and left shift, He appears to have a metabolic acidosis. He is due for dialysis tomorrow. Patient hypoxic to 86% at SNF. He is not normally 02 dependnet.   Patient admitted by Dr. Laren Everts to the step down unit. He appears stable for admission.      Margarita Mail, PA-C 01/28/14 1547

## 2014-01-28 DIAGNOSIS — T07XXXA Unspecified multiple injuries, initial encounter: Secondary | ICD-10-CM

## 2014-01-28 DIAGNOSIS — A419 Sepsis, unspecified organism: Principal | ICD-10-CM

## 2014-01-28 LAB — CBC
HCT: 28.5 % — ABNORMAL LOW (ref 39.0–52.0)
Hemoglobin: 8.8 g/dL — ABNORMAL LOW (ref 13.0–17.0)
MCH: 26.4 pg (ref 26.0–34.0)
MCHC: 30.9 g/dL (ref 30.0–36.0)
MCV: 85.6 fL (ref 78.0–100.0)
Platelets: 316 10*3/uL (ref 150–400)
RBC: 3.33 MIL/uL — AB (ref 4.22–5.81)
RDW: 18.4 % — ABNORMAL HIGH (ref 11.5–15.5)
WBC: 10.5 10*3/uL (ref 4.0–10.5)

## 2014-01-28 LAB — GLUCOSE, CAPILLARY
GLUCOSE-CAPILLARY: 99 mg/dL (ref 70–99)
GLUCOSE-CAPILLARY: 102 mg/dL — AB (ref 70–99)
Glucose-Capillary: 105 mg/dL — ABNORMAL HIGH (ref 70–99)
Glucose-Capillary: 89 mg/dL (ref 70–99)

## 2014-01-28 LAB — BASIC METABOLIC PANEL
ANION GAP: 15 (ref 5–15)
BUN: 14 mg/dL (ref 6–23)
CALCIUM: 8.7 mg/dL (ref 8.4–10.5)
CO2: 24 meq/L (ref 19–32)
Chloride: 95 mEq/L — ABNORMAL LOW (ref 96–112)
Creatinine, Ser: 5.16 mg/dL — ABNORMAL HIGH (ref 0.50–1.35)
GFR calc Af Amer: 11 mL/min — ABNORMAL LOW (ref >90)
GFR calc non Af Amer: 10 mL/min — ABNORMAL LOW (ref >90)
Glucose, Bld: 108 mg/dL — ABNORMAL HIGH (ref 70–99)
Potassium: 4.6 mEq/L (ref 3.7–5.3)
Sodium: 134 mEq/L — ABNORMAL LOW (ref 137–147)

## 2014-01-28 LAB — PROTIME-INR
INR: 1.37 (ref 0.00–1.49)
Prothrombin Time: 16.9 seconds — ABNORMAL HIGH (ref 11.6–15.2)

## 2014-01-28 LAB — HEPATITIS B SURFACE ANTIGEN: Hepatitis B Surface Ag: NEGATIVE

## 2014-01-28 MED ORDER — OXYCODONE HCL 5 MG PO TABS
10.0000 mg | ORAL_TABLET | ORAL | Status: DC | PRN
  Administered 2014-01-29 – 2014-02-02 (×7): 10 mg via ORAL
  Filled 2014-01-28 (×8): qty 2

## 2014-01-28 MED ORDER — BOOST / RESOURCE BREEZE PO LIQD
1.0000 | Freq: Two times a day (BID) | ORAL | Status: DC
  Administered 2014-01-29 – 2014-02-02 (×6): 1 via ORAL

## 2014-01-28 MED ORDER — PAROXETINE HCL 20 MG PO TABS
20.0000 mg | ORAL_TABLET | Freq: Every day | ORAL | Status: DC
  Administered 2014-01-28 – 2014-02-01 (×5): 20 mg via ORAL
  Filled 2014-01-28 (×7): qty 1

## 2014-01-28 MED ORDER — ONDANSETRON HCL 4 MG PO TABS: 4.0000 mg | ORAL_TABLET | Freq: Four times a day (QID) | ORAL | Status: DC | PRN

## 2014-01-28 MED ORDER — CARVEDILOL 3.125 MG PO TABS
3.1250 mg | ORAL_TABLET | Freq: Two times a day (BID) | ORAL | Status: DC
  Administered 2014-01-28 – 2014-02-01 (×8): 3.125 mg via ORAL
  Filled 2014-01-28 (×13): qty 1

## 2014-01-28 MED ORDER — ACETAMINOPHEN 650 MG RE SUPP: 650.0000 mg | Freq: Four times a day (QID) | RECTAL | Status: DC | PRN

## 2014-01-28 MED ORDER — IPRATROPIUM-ALBUTEROL 0.5-2.5 (3) MG/3ML IN SOLN: 3.0000 mL | Freq: Four times a day (QID) | RESPIRATORY_TRACT | Status: DC

## 2014-01-28 MED ORDER — DARBEPOETIN ALFA-POLYSORBATE 150 MCG/0.3ML IJ SOLN
150.0000 ug | INTRAMUSCULAR | Status: DC
Start: 2014-01-28 — End: 2014-02-09
  Administered 2014-01-28 – 2014-02-04 (×2): 150 ug via INTRAVENOUS
  Filled 2014-01-28 (×2): qty 0.3

## 2014-01-28 MED ORDER — ONDANSETRON HCL 4 MG/2ML IJ SOLN
4.0000 mg | Freq: Four times a day (QID) | INTRAMUSCULAR | Status: DC | PRN
  Administered 2014-01-30 – 2014-02-02 (×2): 4 mg via INTRAVENOUS
  Filled 2014-01-28: qty 2

## 2014-01-28 MED ORDER — SODIUM CHLORIDE 0.9 % IJ SOLN
3.0000 mL | Freq: Two times a day (BID) | INTRAMUSCULAR | Status: DC
  Administered 2014-01-28: 3 mL via INTRAVENOUS

## 2014-01-28 MED ORDER — HEPARIN SODIUM (PORCINE) 5000 UNIT/ML IJ SOLN: 5000.0000 [IU] | Freq: Three times a day (TID) | INTRAMUSCULAR | Status: DC

## 2014-01-28 MED ORDER — SODIUM CHLORIDE 0.9 % IJ SOLN
3.0000 mL | Freq: Two times a day (BID) | INTRAMUSCULAR | Status: DC
  Administered 2014-01-29 – 2014-02-02 (×6): 3 mL via INTRAVENOUS

## 2014-01-28 MED ORDER — ATORVASTATIN CALCIUM 20 MG PO TABS
20.0000 mg | ORAL_TABLET | Freq: Every day | ORAL | Status: DC
  Administered 2014-01-28 – 2014-02-02 (×6): 20 mg via ORAL
  Filled 2014-01-28 (×8): qty 1

## 2014-01-28 MED ORDER — ZOLPIDEM TARTRATE 5 MG PO TABS
5.0000 mg | ORAL_TABLET | Freq: Every evening | ORAL | Status: DC | PRN
  Administered 2014-01-31 – 2014-02-01 (×2): 5 mg via ORAL
  Filled 2014-01-28 (×2): qty 1

## 2014-01-28 MED ORDER — IPRATROPIUM-ALBUTEROL 0.5-2.5 (3) MG/3ML IN SOLN: 3.0000 mL | Freq: Four times a day (QID) | RESPIRATORY_TRACT | Status: DC | PRN

## 2014-01-28 MED ORDER — ACETAMINOPHEN 325 MG PO TABS
650.0000 mg | ORAL_TABLET | Freq: Four times a day (QID) | ORAL | Status: DC | PRN
  Administered 2014-01-29 – 2014-02-09 (×4): 650 mg via ORAL
  Filled 2014-01-28 (×6): qty 2

## 2014-01-28 MED ORDER — ISOSORB DINITRATE-HYDRALAZINE 20-37.5 MG PO TABS
1.0000 | ORAL_TABLET | Freq: Two times a day (BID) | ORAL | Status: DC
  Administered 2014-01-28 – 2014-02-01 (×9): 1 via ORAL
  Filled 2014-01-28 (×13): qty 1

## 2014-01-28 MED ORDER — DARBEPOETIN ALFA-POLYSORBATE 150 MCG/0.3ML IJ SOLN
INTRAMUSCULAR | Status: AC
Start: 2014-01-28 — End: 2014-01-29
  Filled 2014-01-28: qty 0.3

## 2014-01-28 MED ORDER — HEPARIN SODIUM (PORCINE) 5000 UNIT/ML IJ SOLN
5000.0000 [IU] | Freq: Three times a day (TID) | INTRAMUSCULAR | Status: DC
  Administered 2014-01-28 – 2014-02-09 (×34): 5000 [IU] via SUBCUTANEOUS
  Filled 2014-01-28 (×42): qty 1

## 2014-01-28 MED ORDER — SODIUM CHLORIDE 0.9 % IV SOLN
62.5000 mg | INTRAVENOUS | Status: DC
  Administered 2014-01-28 – 2014-02-04 (×2): 62.5 mg via INTRAVENOUS
  Filled 2014-01-28 (×4): qty 5

## 2014-01-28 NOTE — ED Notes (Signed)
Call Barker [facility] [phone] PT Daughter

## 2014-01-28 NOTE — Consult Note (Signed)
Still awaiting family decision.  Please contact me for scheduling if pt or family wishes to proceed with amputation.  Ruta Hinds, MD [facility] Office: [phone] Pager: [phone]

## 2014-01-28 NOTE — Consult Note (Signed)
[facility] Renal Consultation Note  Indication for Consultation:  Management of ESRD/hemodialysis; anemia, hypertension/volume and secondary hyperparathyroidism  HPI: Sean [name] is a 78 y.o. male admitted from [facility] with confusion ,temp 100.4 , no palpated pulse in LLE,  with severe calciphylaxis advanced left leg wound .He was discharged [facility] 8/08-05/2014 for similar problems of early sepsis with enlarging right leg calciphylaxis wound. He has ho HD mwf , last hd 01/26/14, sec hyperparathyroidism with complication of calciphylaxis leg wound, Dementia ( but had been  Somewhat independent until Leg wound advancing(able to ambulate up to 2 months ago ). Seen  by Dr. Oneida Alar last night with " 50 cm length wound circumferential full thickness with exposed bone extending from just below the knee to the ankle" with noted need for amputation( /family hesitant last night and pt confused). This am  in room pt. Alert, recognizing me from [facility] by name , he is agreeable to amputaton and oldest daughter in room agreeable .      Past Medical History  Diagnosis Date  . Hyperparathyroidism   . Arthritis   . Hyperlipidemia   . Hypertension   . Neuromuscular disorder   . Stroke   . GERD (gastroesophageal reflux disease)   . Headache(784.0)     occasional  migraine   . Heart attack 2011; 1990's  . Type II diabetes mellitus   . Peripheral neuropathy   . Anxiety   . Depression   . Dialysis patient     M, W, F; [place] (09/19/11)  . Chronic kidney disease     Hemo MWF    Past Surgical History  Procedure Laterality Date  . Colon surgery  2011    colon resection   . Parathyroidectomy  2011  . Capd insertion  09/12/2011    Procedure: LAPAROSCOPIC INSERTION CONTINUOUS AMBULATORY PERITONEAL DIALYSIS  (CAPD) CATHETER;  Surgeon: Edward Jolly, MD;  Location: [place];  Service: General;  Laterality: N/A;  . Sp av dialysis shunt access existing *l*  2003  . Dg  av dialysis shunt intro ndl*r* or      "not working" (09/19/11)  . Capd removal  03/12/2012    Procedure: CONTINUOUS AMBULATORY PERITONEAL DIALYSIS  (CAPD) CATHETER REMOVAL;  Surgeon: Edward Jolly, MD;  Location: [facility];  Service: General;  Laterality: N/A;      Family History  Problem Relation Age of Onset  . Cancer Sister     breast  . Cancer Brother     lung  . Anesthesia problems Neg Hx   . Hypotension Neg Hx   . Malignant hyperthermia Neg Hx   . Pseudochol deficiency Neg Hx       reports that he quit smoking about 37 years ago. His smoking use included Cigarettes. He has a 1.75 pack-year smoking history. He has never used smokeless tobacco. He reports that he does not drink alcohol or use illicit drugs.   Allergies  Allergen Reactions  . Benazepril Hcl Hives, Itching and Rash    flareups on patient head    Prior to Admission medications   Medication Sig Start Date End Date Taking? Authorizing Provider  acetaminophen (TYLENOL) 325 MG tablet Take 2 tablets (650 mg total) by mouth every 6 (six) hours as needed for mild pain, fever or headache. 12/24/13  Yes Debbe Odea, MD  albuterol (PROVENTIL) (2.5 MG/3ML) 0.083% nebulizer solution Take 3 mLs (2.5 mg total) by nebulization every 6 (six) hours as needed for  wheezing or shortness of breath. 01/21/14  Yes Ripudeep Krystal Eaton, MD  aspirin 325 MG tablet Take 325 mg by mouth every evening.   Yes Historical Provider, MD  atorvastatin (LIPITOR) 20 MG tablet Take 20 mg by mouth at bedtime.   Yes Historical Provider, MD  b complex-vitamin c-folic acid (NEPHRO-VITE) 0.8 MG TABS Take 0.8 mg by mouth at bedtime.   Yes Historical Provider, MD  carvedilol (COREG) 3.125 MG tablet Take 1 tablet (3.125 mg total) by mouth 2 (two) times daily with a meal. 12/16/13  Yes Debbe Odea, MD  collagenase (SANTYL) ointment Apply 1 application topically daily. 01/21/14  Yes Ripudeep Krystal Eaton, MD  HYDROcodone-acetaminophen (NORCO/VICODIN) 5-325 MG per tablet Take  1 tablet by mouth every 6 (six) hours as needed for moderate pain or severe pain. 01/22/14  Yes Tiffany L Reed, DO  hydrOXYzine (ATARAX/VISTARIL) 10 MG tablet Take 10 mg by mouth at bedtime as needed (for sleep).    Yes Historical Provider, MD  isosorbide-hydrALAZINE (BIDIL) 20-37.5 MG per tablet Take 1 tablet by mouth 2 (two) times daily. 12/16/13  Yes Debbe Odea, MD  levofloxacin (LEVAQUIN) 500 MG tablet Take 1 tablet (500 mg total) by mouth every other day. X 2 weeks 01/21/14  Yes Ripudeep Krystal Eaton, MD  PARoxetine (PAXIL) 20 MG tablet Take 1 tablet (20 mg total) by mouth daily. 01/21/14  Yes Ripudeep Krystal Eaton, MD  feeding supplement, RESOURCE BREEZE, (RESOURCE BREEZE) LIQD Take 1 Container by mouth 2 (two) times daily between meals. 01/21/14   Ripudeep Krystal Eaton, MD     Anti-infectives   Start     Dose/Rate Route Frequency Ordered Stop   01/28/14 1200  vancomycin (VANCOCIN) IVPB 1000 mg/200 mL premix     1,000 mg 200 mL/hr over 60 Minutes Intravenous Every M-W-F (Hemodialysis) 01/27/14 2005     01/28/14 0430  piperacillin-tazobactam (ZOSYN) IVPB 2.25 g     2.25 g 100 mL/hr over 30 Minutes Intravenous 3 times per day 01/27/14 2005     01/27/14 2015  vancomycin (VANCOCIN) 2,000 mg in sodium chloride 0.9 % 500 mL IVPB     2,000 mg 250 mL/hr over 120 Minutes Intravenous  Once 01/27/14 2005 01/27/14 2336   01/27/14 2015  piperacillin-tazobactam (ZOSYN) IVPB 3.375 g     3.375 g 100 mL/hr over 30 Minutes Intravenous  Once 01/27/14 2005 01/28/14 0430      Results for orders placed during the hospital encounter of 01/27/14 (from the past 48 hour(s))  I-STAT ARTERIAL BLOOD GAS, ED     Status: Abnormal   Collection Time    01/27/14  7:42 PM      Result Value Ref Range   pH, Arterial 7.501 (*) 7.350 - 7.450   pCO2 arterial 41.1  35.0 - 45.0 mmHg   pO2, Arterial 82.0  80.0 - 100.0 mmHg   Bicarbonate 32.0 (*) 20.0 - 24.0 mEq/L   TCO2 33  0 - 100 mmol/L   O2 Saturation 97.0     Acid-Base Excess 8.0 (*) 0.0  - 2.0 mmol/L   Patient temperature 99.7 F     Collection site RADIAL, ALLEN'S TEST ACCEPTABLE     Drawn by RT     Sample type ARTERIAL    CBC WITH DIFFERENTIAL     Status: Abnormal   Collection Time    01/27/14  9:00 PM      Result Value Ref Range   WBC 10.9 (*) 4.0 - 10.5 K/uL   RBC 3.18 (*)  4.22 - 5.81 MIL/uL   Hemoglobin 8.5 (*) 13.0 - 17.0 g/dL   HCT 27.4 (*) 39.0 - 52.0 %   MCV 86.2  78.0 - 100.0 fL   MCH 26.7  26.0 - 34.0 pg   MCHC 31.0  30.0 - 36.0 g/dL   RDW 18.0 (*) 11.5 - 15.5 %   Platelets 295  150 - 400 K/uL   Neutrophils Relative % 75  43 - 77 %   Neutro Abs 8.1 (*) 1.7 - 7.7 K/uL   Lymphocytes Relative 8 (*) 12 - 46 %   Lymphs Abs 0.9  0.7 - 4.0 K/uL   Monocytes Relative 16 (*) 3 - 12 %   Monocytes Absolute 1.8 (*) 0.1 - 1.0 K/uL   Eosinophils Relative 1  0 - 5 %   Eosinophils Absolute 0.1  0.0 - 0.7 K/uL   Basophils Relative 0  0 - 1 %   Basophils Absolute 0.0  0.0 - 0.1 K/uL  COMPREHENSIVE METABOLIC PANEL     Status: Abnormal   Collection Time    01/27/14  9:00 PM      Result Value Ref Range   Sodium 135 (*) 137 - 147 mEq/L   Potassium 4.3  3.7 - 5.3 mEq/L   Chloride 94 (*) 96 - 112 mEq/L   CO2 29  19 - 32 mEq/L   Glucose, Bld 117 (*) 70 - 99 mg/dL   BUN 13  6 - 23 mg/dL   Creatinine, Ser 4.80 (*) 0.50 - 1.35 mg/dL   Calcium 8.7  8.4 - 10.5 mg/dL   Total Protein 7.3  6.0 - 8.3 g/dL   Albumin 1.9 (*) 3.5 - 5.2 g/dL   AST 28  0 - 37 U/L   ALT 9  0 - 53 U/L   Alkaline Phosphatase 85  39 - 117 U/L   Total Bilirubin 0.6  0.3 - 1.2 mg/dL   GFR calc non Af Amer 11 (*) >90 mL/min   GFR calc Af Amer 12 (*) >90 mL/min   Comment: (NOTE)     The eGFR has been calculated using the CKD EPI equation.     This calculation has not been validated in all clinical situations.     eGFR's persistently <90 mL/min signify possible Chronic Kidney     Disease.   Anion gap 12  5 - 15  I-STAT CG4 LACTIC ACID, ED     Status: None   Collection Time    01/27/14  9:19 PM       Result Value Ref Range   Lactic Acid, Venous 0.65  0.5 - 2.2 mmol/L  GLUCOSE, CAPILLARY     Status: Abnormal   Collection Time    01/28/14  3:17 AM      Result Value Ref Range   Glucose-Capillary 105 (*) 70 - 99 mg/dL  CBC     Status: Abnormal   Collection Time    01/28/14  3:18 AM      Result Value Ref Range   WBC 10.5  4.0 - 10.5 K/uL   RBC 3.33 (*) 4.22 - 5.81 MIL/uL   Hemoglobin 8.8 (*) 13.0 - 17.0 g/dL   HCT 28.5 (*) 39.0 - 52.0 %   MCV 85.6  78.0 - 100.0 fL   MCH 26.4  26.0 - 34.0 pg   MCHC 30.9  30.0 - 36.0 g/dL   RDW 18.4 (*) 11.5 - 15.5 %   Platelets 316  150 - 400 K/uL  BASIC METABOLIC PANEL     Status: Abnormal   Collection Time    01/28/14  3:18 AM      Result Value Ref Range   Sodium 134 (*) 137 - 147 mEq/L   Potassium 4.6  3.7 - 5.3 mEq/L   Chloride 95 (*) 96 - 112 mEq/L   CO2 24  19 - 32 mEq/L   Glucose, Bld 108 (*) 70 - 99 mg/dL   BUN 14  6 - 23 mg/dL   Creatinine, Ser 5.16 (*) 0.50 - 1.35 mg/dL   Calcium 8.7  8.4 - 10.5 mg/dL   GFR calc non Af Amer 10 (*) >90 mL/min   GFR calc Af Amer 11 (*) >90 mL/min   Comment: (NOTE)     The eGFR has been calculated using the CKD EPI equation.     This calculation has not been validated in all clinical situations.     eGFR's persistently <90 mL/min signify possible Chronic Kidney     Disease.   Anion gap 15  5 - 15  PROTIME-INR     Status: Abnormal   Collection Time    01/28/14  3:18 AM      Result Value Ref Range   Prothrombin Time 16.9 (*) 11.6 - 15.2 seconds   INR 1.37  0.00 - 1.49  GLUCOSE, CAPILLARY     Status: None   Collection Time    01/28/14  7:41 AM      Result Value Ref Range   Glucose-Capillary 99  70 - 99 mg/dL   Comment 1 Notify RN     Comment 2 Documented in Chart      ROS: see hpi for pos.  Physical Exam: Filed Vitals:   01/28/14 0700  BP:   Pulse:   Temp: 99.2 F (37.3 C)  Resp:      General: alert elderly bm , talkative, nad,recognizing me by name from [facility]/ disoriented  to place and time HEENT: [place], MMM, [place] , nonicteric Neck: no jvd , no bruit, L IJ  With iv line present Heart: RRR, no rub, mur, or gallop Lungs: CTA bilat.  Abdomen: BS pos. , soft , nt, nd Extremities: R lower extrm no pedal edema, L lower leg bandaged not removed  Skin: R lower leg dry skin, no overt rash,no rt foot ulcer Neuro: alert Ox to Person , now  moves all extrem,  Dialysis Access: Pos. Bruit L UA AVG  Dialysis Orders: Center: [facility]  on MWF . EDW 82kg HD Bath 4.0k, 2.25ca  Time 4.o hr Heparin 6,000. Access LUA AVG BFR 450 DFR 1.5    0 Vit D HD Arasnep 175mg qwed Units IV/HD  Venofer  100 mg  q weekly hd   OP Lab ipth 647 12/31/13  Assessment/Plan 1. Left Lower Leg Calciphylactic/Extensive Necrotic wounds ( unsalvageable)= On iv Zosyn/ Dr. FOneida Alarseeing/ Oldest daughter in room agreeable with amputation 2. ESRD -  HD MWF  K=4.6 HD this am ,no hep. anticipating amputation ( prior low k and 4 k bath at dc ,follow trend) 3. Hypertension/volume  -  bp 111/60 ,no uf on hd/ Bidil and coreg 3.1269mbid hold with low bp /last hd left below edw /will need  lower edw  4. Anemia  - hgb 8.8 continue Aran esp ^ 150 Wed hd/ weekly venofer on hd/  5. Metabolic bone disease -  No vit d with Calciphylaxis/  no binders with phos low last week 6. Nutrition -  needs supplement when taking pos, prior low K , follow trend in [place], [place] [phone] 01/28/2014, 8:17 AM

## 2014-01-28 NOTE — Procedures (Signed)
I was present at this dialysis session. I have reviewed the session itself and made appropriate changes.   Pearson Grippe  MD 01/28/2014, 11:46 AM

## 2014-01-28 NOTE — Consult Note (Signed)
I saw the patient and agree with the above assessment and plan.   Pt in NAD, pleasant but confused.  Plan developing.  Will try for HD this AM.

## 2014-01-28 NOTE — Progress Notes (Signed)
[facility] PROGRESS NOTE  Sean [name] JJK:[id number] DOB: May 04, 1936 DOA: 01/27/2014 PCP: Philis Fendt, MD  Assessment/Plan: 1. Left leg calciphylalactic unstageable open wound between the knee and the ankle showing bone and muscles was absent to ounces of the dorsalis pedis and the posterior tibialis.   -Continue IV antibiotics vancomycin and Zosyn  -Seen by Dr. Oneida Alar and left AKA recommended, but still awaiting family decision at this time>> will follow -Continue pain management -agree that Given his comorbidities -he will definitely be high risk for surgery however without it he will probably die  2. History of CAD with multiple MIs in the past - last nuclear test done on 7/5 showed no ischemia but an ejection fraction of 25%. -His chest pain-free, continue outpatient medications  3. End-stage on hemodialysis followed by. center [place] [facility] and is scheduled for Monday Wednesday and Friday  -Dialysis per renal 4, advanced dementia -On no meds, follow  Code Status: full Family Communication: None at bedside  Disposition Plan: Keep in step down for today   Consultants:  Vascular  Procedures:  None  Antibiotics:  Vancomycin and Zosyn started on 818  HPI/Subjective: States still with left leg soreness. He denies chest pain and no shortness of breath. States he and family and not decided on surgery at this time  Objective: Filed Vitals:   01/28/14 0700  BP:   Pulse:   Temp: 99.2 F (37.3 C)  Resp:     Intake/Output Summary (Last 24 hours) at 01/28/14 1026 Last data filed at 01/28/14 0500  Gross per 24 hour  Intake     50 ml  Output      0 ml  Net     50 ml   Filed Weights   01/27/14 1900 01/28/14 0300  Weight: 82.5 kg (181 lb 14.1 oz) 82.1 kg (181 lb)    Exam:  General: alert & oriented x 1 In NAD Cardiovascular: RRR, nl S1 s2 Respiratory: Decreased breath sounds at bases, no crackles or wheezes Abdomen: soft +BS NT/ND, no masses  palpable Extremities: Left lower extremity with dressing clean and dry, right lower extremity with no cyanosis and no edema    Data Reviewed: Basic Metabolic Panel:  Recent Labs Lab 01/21/14 1225 01/27/14 2100 01/28/14 0318  NA 131* 135* 134*  K 3.7 4.3 4.6  CL 88* 94* 95*  CO2 25 29 24   GLUCOSE 211* 117* 108*  BUN 20 13 14   CREATININE 4.75* 4.80* 5.16*  CALCIUM 8.7 8.7 8.7  PHOS 2.4  --   --    Liver Function Tests:  Recent Labs Lab 01/21/14 1225 01/27/14 2100  AST  --  28  ALT  --  9  ALKPHOS  --  85  BILITOT  --  0.6  PROT  --  7.3  ALBUMIN 1.8* 1.9*   No results found for this basename: LIPASE, AMYLASE,  in the last 168 hours No results found for this basename: AMMONIA,  in the last 168 hours CBC:  Recent Labs Lab 01/21/14 1225 01/27/14 2100 01/28/14 0318  WBC 13.4* 10.9* 10.5  NEUTROABS  --  8.1*  --   HGB 8.9* 8.5* 8.8*  HCT 28.6* 27.4* 28.5*  MCV 85.9 86.2 85.6  PLT 355 295 316   Cardiac Enzymes: No results found for this basename: CKTOTAL, CKMB, CKMBINDEX, TROPONINI,  in the last 168 hours BNP (last 3 results)  Recent Labs  12/13/13 1408 01/17/14 0800  PROBNP >70000.0* >70000.0*   CBG:  Recent  Labs Lab 01/21/14 1606 01/28/14 0317 01/28/14 0741  GLUCAP 122* 105* 99    No results found for this or any previous visit (from the past 240 hour(s)).   Studies: Dg Chest Port 1 View  01/27/2014   CLINICAL DATA:  Fever.  Wound infection.  EXAM: PORTABLE CHEST - 1 VIEW  COMPARISON:  01/17/2014  FINDINGS: There is tortuosity and calcification of the thoracic aorta. There are no infiltrates or effusions. Aeration has improved bilaterally. No acute osseous abnormality.  IMPRESSION: Resolution of interstitial edema.   Electronically Signed   By: Rozetta Nunnery M.D.   On: 01/27/2014 19:30    Scheduled Meds: . atorvastatin  20 mg Oral QHS  . carvedilol  3.125 mg Oral BID WC  . darbepoetin (ARANESP) injection - DIALYSIS  150 mcg Intravenous Q  Wed-HD  . feeding supplement (RESOURCE BREEZE)  1 Container Oral BID BM  . ferric gluconate (FERRLECIT/NULECIT) IV  62.5 mg Intravenous Q Wed-HD  . heparin  5,000 Units Subcutaneous 3 times per day  . isosorbide-hydrALAZINE  1 tablet Oral BID  . PARoxetine  20 mg Oral Daily  . piperacillin-tazobactam (ZOSYN)  IV  2.25 g Intravenous 3 times per day  . sodium chloride  3 mL Intravenous Q12H  . sodium chloride  3 mL Intravenous Q12H  . vancomycin  1,000 mg Intravenous Q M,W,F-HD   Continuous Infusions:   Principal Problem:   Open leg wound    Time spent: [facility] Pager [phone]. If 7PM-7AM, please contact night-coverage at [email or web address], password [facility] 01/28/2014, 10:26 AM  LOS: 1 day

## 2014-01-28 NOTE — Progress Notes (Signed)
Utilization review completed.  

## 2014-01-29 ENCOUNTER — Inpatient Hospital Stay (HOSPITAL_COMMUNITY): Payer: Medicare Other

## 2014-01-29 LAB — GLUCOSE, CAPILLARY
Glucose-Capillary: 90 mg/dL (ref 70–99)
Glucose-Capillary: 113 mg/dL — ABNORMAL HIGH (ref 70–99)
Glucose-Capillary: 98 mg/dL (ref 70–99)

## 2014-01-29 MED ORDER — HEPARIN SODIUM (PORCINE) 1000 UNIT/ML DIALYSIS
6000.0000 [IU] | Freq: Once | INTRAMUSCULAR | Status: DC
  Filled 2014-01-29: qty 6

## 2014-01-29 MED ORDER — LIDOCAINE HCL 1 % IJ SOLN
INTRAMUSCULAR | Status: AC
  Filled 2014-01-29: qty 20

## 2014-01-29 MED ORDER — VANCOMYCIN HCL IN DEXTROSE 1-5 GM/200ML-% IV SOLN
1000.0000 mg | Freq: Once | INTRAVENOUS | Status: AC
  Administered 2014-01-31: 1000 mg via INTRAVENOUS
  Filled 2014-01-29: qty 200

## 2014-01-29 NOTE — Progress Notes (Signed)
Clarification from Dr. Joelyn Oms that pt is to go to HD tomorrow (01/30/14) and Saturday. Dialysis dept notified.

## 2014-01-29 NOTE — Progress Notes (Signed)
Admit: 01/27/2014 LOS: 2  42M ESRD MWF at [facility] w/ unstageable L leg wound for AKA Monday 8/24.    Subjective:  HD uneventful Yesterday Pt / family agreed to AKA on Monday Post weight 82.9kg   08/19 0701 - 08/20 0700 In: 258 [P.O.:240; IV Piggyback:405] Out: -197   Filed Weights   01/28/14 0300 01/28/14 1145 01/28/14 1632  Weight: 82.1 kg (181 lb) 82 kg (180 lb 12.4 oz) 82.9 kg (182 lb 12.2 oz)    Current meds: reviewed including Aranesp 162mcg qWed; IV Fe qWed, Vanc/Zosyn Current Labs: reviewed    Physical Exam:  Blood pressure 104/51, pulse 87, temperature 98.4 F (36.9 C), temperature source Oral, resp. rate 17, height 5\' 5"  (1.651 m), weight 82.9 kg (182 lb 12.2 oz), SpO2 94.00%. NAD RRR, 2/6 MSM, nl s1s2 CTAB S/nt/nd LFA AVG +B/T L leg bandaged No rashes/lesions  Dialysis Orders: Center: [facility] on MWF .  EDW 82kg HD Bath 4.0k, 2.25ca Time 4.o hr Heparin 6,000. Access LUA AVG BFR 450 DFR 1.5  0 Vit D HD Arasnep 139mcg qwed Units IV/HD Venofer 100 mg q weekly hd  OP Lab ipth 647 12/31/13  Assessment 1. L leg ?calciphylaxis related leg wound for AKA 8/24 2. ESRD MWF via LUA AVG; heparin 6000 units qTx 3. Anemia on ESA and maintenance Fe 4. BMD, No VDRA or cinacalcet, Phos/Ca controlled 5. HTN/Vol: EDW 82g and normotensive on carvedilol and Bidil  Plan 1. HD tomorrow and Saturday; not on Monday 2. Challenge EDW to 81kg; will change post amputation 3. No VDRA given concern for calciphlyaxis 4. Monitor Hb  Pearson Grippe MD 01/29/2014, 8:43 AM   Recent Labs Lab 01/27/14 2100 01/28/14 0318  NA 135* 134*  K 4.3 4.6  CL 94* 95*  CO2 29 24  GLUCOSE 117* 108*  BUN 13 14  CREATININE 4.80* 5.16*  CALCIUM 8.7 8.7    Recent Labs Lab 01/27/14 2100 01/28/14 0318  WBC 10.9* 10.5  NEUTROABS 8.1*  --   HGB 8.5* 8.8*  HCT 27.4* 28.5*  MCV 86.2 85.6  PLT 295 316

## 2014-01-29 NOTE — Consult Note (Signed)
Family meeting with patient and his 2 daughters.  Greater than 30 minutes spent answering questions regarding risk benefit complications of amputation as well as postoperative care and potential for rehab.  At this point family is consenting to left above knee amputation on Monday.  He will need to have his dialysis schedule shifted to either Saturday or Sunday depending on Nephrology opinion.  NOT dialysis Monday morning.  Filed Vitals:   01/29/14 0030 01/29/14 0415 01/29/14 0445 01/29/14 0700  BP: 103/51 116/57  104/51  Pulse: 95 91 96 87  Temp:  99.3 F (37.4 C)    TempSrc:  Oral    Resp:      Height:      Weight:      SpO2: 98% 96% 99% 94%    A: non salvageable left leg P: left AKA Monday  Ruta Hinds, MD [facility] Office: [phone] Pager: [phone]

## 2014-01-29 NOTE — Progress Notes (Signed)
[facility] PROGRESS NOTE  Sean [name] [id number]:[id number] DOB: 1935-06-25 DOA: 01/27/2014 PCP: Philis Fendt, MD  Assessment/Plan: 1. Left leg calciphylalactic unstageable open wound between the knee and the ankle showing bone and muscles was absent to ounces of the dorsalis pedis and the posterior tibialis.   -Continue IV antibiotics vancomycin and Zosyn  -Continue pain management -agree that Given his comorbidities -he will definitely be high risk for surgery however without it he will probably die  -AKA now planned for Monday 8/24 per Dr Oneida Alar- Pt/family agreeable -he remains hemodynamically stable, will transfer to tele 2. History of CAD with multiple MIs in the past - last nuclear test done on 7/5 showed no ischemia but an ejection fraction of 25%. -His chest pain-free, continue outpatient medications  3. End-stage on hemodialysis followed by. center [place] surgery and is scheduled for Monday Wednesday and Friday  -Dialysis per renal -note as per Dr Oneida Alar pt will need his dialysis schedule adjusted so he can be dialzed prior to planned surgery on 8/24 4, advanced dementia -On no meds, follow  Code Status: full Family Communication: None at bedside  Disposition Plan: Keep in step down for today   Consultants:  Vascular  Procedures:  None  Antibiotics:  Vancomycin and Zosyn started on 818  HPI/Subjective: C/o headache, denies CP and no SOB.Per nsg family at bedside earlier and consented to AKA on Monday 8/24 Objective: Filed Vitals:   01/29/14 0700  BP: 104/51  Pulse: 87  Temp: 98.4 F (36.9 C)  Resp:     Intake/Output Summary (Last 24 hours) at 01/29/14 0848 Last data filed at 01/29/14 0754  Gross per 24 hour  Intake    695 ml  Output   -197 ml  Net    892 ml   Filed Weights   01/28/14 0300 01/28/14 1145 01/28/14 1632  Weight: 82.1 kg (181 lb) 82 kg (180 lb 12.4 oz) 82.9 kg (182 lb 12.2 oz)    Exam:  General: alert & answers simple  questions appropriately In NAD Cardiovascular: RRR, nl S1 s2 Respiratory: Decreased breath sounds at bases, no crackles or wheezes Abdomen: soft +BS NT/ND, no masses palpable Extremities: Left lower extremity with dressing clean and dry, right lower extremity with no cyanosis and no edema    Data Reviewed: Basic Metabolic Panel:  Recent Labs Lab 01/27/14 2100 01/28/14 0318  NA 135* 134*  K 4.3 4.6  CL 94* 95*  CO2 29 24  GLUCOSE 117* 108*  BUN 13 14  CREATININE 4.80* 5.16*  CALCIUM 8.7 8.7   Liver Function Tests:  Recent Labs Lab 01/27/14 2100  AST 28  ALT 9  ALKPHOS 85  BILITOT 0.6  PROT 7.3  ALBUMIN 1.9*   No results found for this basename: LIPASE, AMYLASE,  in the last 168 hours No results found for this basename: AMMONIA,  in the last 168 hours CBC:  Recent Labs Lab 01/27/14 2100 01/28/14 0318  WBC 10.9* 10.5  NEUTROABS 8.1*  --   HGB 8.5* 8.8*  HCT 27.4* 28.5*  MCV 86.2 85.6  PLT 295 316   Cardiac Enzymes: No results found for this basename: CKTOTAL, CKMB, CKMBINDEX, TROPONINI,  in the last 168 hours BNP (last 3 results)  Recent Labs  12/13/13 1408 01/17/14 0800  PROBNP >70000.0* >70000.0*   CBG:  Recent Labs Lab 01/28/14 0317 01/28/14 0741 01/28/14 1737 01/28/14 2048 01/29/14 0814  GLUCAP 105* 99 89 102* 90    No results found for this or  any previous visit (from the past 240 hour(s)).   Studies: Dg Chest Port 1 View  01/27/2014   CLINICAL DATA:  Fever.  Wound infection.  EXAM: PORTABLE CHEST - 1 VIEW  COMPARISON:  01/17/2014  FINDINGS: There is tortuosity and calcification of the thoracic aorta. There are no infiltrates or effusions. Aeration has improved bilaterally. No acute osseous abnormality.  IMPRESSION: Resolution of interstitial edema.   Electronically Signed   By: Rozetta Nunnery M.D.   On: 01/27/2014 19:30    Scheduled Meds: . atorvastatin  20 mg Oral QHS  . carvedilol  3.125 mg Oral BID WC  . darbepoetin (ARANESP)  injection - DIALYSIS  150 mcg Intravenous Q Wed-HD  . feeding supplement (RESOURCE BREEZE)  1 Container Oral BID BM  . ferric gluconate (FERRLECIT/NULECIT) IV  62.5 mg Intravenous Q Wed-HD  . heparin  5,000 Units Subcutaneous 3 times per day  . isosorbide-hydrALAZINE  1 tablet Oral BID  . PARoxetine  20 mg Oral Daily  . piperacillin-tazobactam (ZOSYN)  IV  2.25 g Intravenous 3 times per day  . sodium chloride  3 mL Intravenous Q12H  . vancomycin  1,000 mg Intravenous Q M,W,F-HD   Continuous Infusions:   Principal Problem:   Open leg wound    Time spent: [facility] Pager [phone]. If 7PM-7AM, please contact night-coverage at [email or web address], password [facility] 01/29/2014, 8:48 AM  LOS: 2 days

## 2014-01-29 NOTE — Progress Notes (Signed)
Pt removal of left EJ IV. IV team unsuccessful with starting new IV. Dr. Dillard Essex paged, awaiting call back.

## 2014-01-29 NOTE — Procedures (Signed)
RIJV PICC

## 2014-01-29 NOTE — Progress Notes (Addendum)
Received orders to transfer pt, pt and family aware of transfer. VS stable. Awaiting bed assignment.  1530: received bed assignment. Called report to RN on 6E. Pt to transfer after he arrives back from intervention radiology.  1700: pt back from IR, VS stable. Transferring to 6E, family members at bedside.

## 2014-01-29 NOTE — ED Provider Notes (Signed)
Medical screening examination/treatment/procedure(s) were conducted as a shared visit with non-physician practitioner(s) and myself.  I personally evaluated the patient during the encounter.   EKG Interpretation None        Houston Siren III, MD 01/29/14 [phone]

## 2014-01-30 LAB — GLUCOSE, CAPILLARY
GLUCOSE-CAPILLARY: 84 mg/dL (ref 70–99)
GLUCOSE-CAPILLARY: 109 mg/dL — AB (ref 70–99)
Glucose-Capillary: 98 mg/dL (ref 70–99)
Glucose-Capillary: 97 mg/dL (ref 70–99)

## 2014-01-30 LAB — CBC
HCT: 25.7 % — ABNORMAL LOW (ref 39.0–52.0)
Hemoglobin: 8.1 g/dL — ABNORMAL LOW (ref 13.0–17.0)
MCH: 27.2 pg (ref 26.0–34.0)
MCHC: 31.5 g/dL (ref 30.0–36.0)
MCV: 86.2 fL (ref 78.0–100.0)
PLATELETS: 291 10*3/uL (ref 150–400)
RBC: 2.98 MIL/uL — AB (ref 4.22–5.81)
RDW: 18.2 % — ABNORMAL HIGH (ref 11.5–15.5)
WBC: 9.5 10*3/uL (ref 4.0–10.5)

## 2014-01-30 LAB — BASIC METABOLIC PANEL
Anion gap: 16 — ABNORMAL HIGH (ref 5–15)
BUN: 16 mg/dL (ref 6–23)
CO2: 24 mEq/L (ref 19–32)
Calcium: 8.4 mg/dL (ref 8.4–10.5)
Chloride: 93 mEq/L — ABNORMAL LOW (ref 96–112)
Creatinine, Ser: 4.77 mg/dL — ABNORMAL HIGH (ref 0.50–1.35)
GFR calc non Af Amer: 11 mL/min — ABNORMAL LOW (ref >90)
GFR, EST AFRICAN AMERICAN: 12 mL/min — AB (ref >90)
Glucose, Bld: 91 mg/dL (ref 70–99)
POTASSIUM: 4 meq/L (ref 3.7–5.3)
Sodium: 133 mEq/L — ABNORMAL LOW (ref 137–147)

## 2014-01-30 MED ORDER — LIDOCAINE-PRILOCAINE 2.5-2.5 % EX CREA: 1.0000 "application " | TOPICAL_CREAM | CUTANEOUS | Status: DC | PRN

## 2014-01-30 MED ORDER — LIDOCAINE HCL (PF) 1 % IJ SOLN: 5.0000 mL | INTRAMUSCULAR | Status: DC | PRN

## 2014-01-30 MED ORDER — NEPRO/CARBSTEADY PO LIQD: 237.0000 mL | ORAL | Status: DC | PRN

## 2014-01-30 MED ORDER — ALTEPLASE 2 MG IJ SOLR
2.0000 mg | Freq: Once | INTRAMUSCULAR | Status: DC | PRN
  Filled 2014-01-30: qty 2

## 2014-01-30 MED ORDER — PENTAFLUOROPROP-TETRAFLUOROETH EX AERO: 1.0000 "application " | INHALATION_SPRAY | CUTANEOUS | Status: DC | PRN

## 2014-01-30 MED ORDER — SODIUM CHLORIDE 0.9 % IV SOLN: 100.0000 mL | INTRAVENOUS | Status: DC | PRN

## 2014-01-30 MED ORDER — HEPARIN SODIUM (PORCINE) 1000 UNIT/ML DIALYSIS
1000.0000 [IU] | INTRAMUSCULAR | Status: DC | PRN
  Filled 2014-01-30: qty 1

## 2014-01-30 MED ORDER — HEPARIN SODIUM (PORCINE) 1000 UNIT/ML DIALYSIS
6000.0000 [IU] | Freq: Once | INTRAMUSCULAR | Status: DC
Start: 2014-01-31 — End: 2014-01-30
  Filled 2014-01-30: qty 6

## 2014-01-30 MED ORDER — HEPARIN SODIUM (PORCINE) 1000 UNIT/ML DIALYSIS
20.0000 [IU]/kg | INTRAMUSCULAR | Status: DC | PRN
  Filled 2014-01-30: qty 2

## 2014-01-30 MED ORDER — ONDANSETRON HCL 4 MG/2ML IJ SOLN
INTRAMUSCULAR | Status: AC
  Filled 2014-01-30: qty 2

## 2014-01-30 NOTE — Consult Note (Signed)
ANTIBIOTIC CONSULT NOTE - follow up  Pharmacy Consult for Vancomycin and Zosyn Indication: wound infection  Allergies  Allergen Reactions  . Benazepril Hcl Hives, Itching and Rash    flareups on patient head    Patient Measurements: Height: 5\' 5"  (165.1 cm) Weight: 184 lb 8.4 oz (83.7 kg) IBW/kg (Calculated) : 61.5  Vital Signs: Temp: 100.2 F (37.9 C) (08/21 1000) Temp src: Oral (08/21 1000) BP: 91/50 mmHg (08/21 1000) Pulse Rate: 82 (08/21 1000) Intake/Output from previous day: 08/20 0701 - 08/21 0700 In: 170 [P.O.:120; IV Piggyback:50] Out: -  Intake/Output from this shift: Total I/O In: 240 [P.O.:240] Out: -   Labs:  Recent Labs  01/27/14 2100 01/28/14 0318 01/30/14 0720  WBC 10.9* 10.5 9.5  HGB 8.5* 8.8* 8.1*  PLT 295 316 291  CREATININE 4.80* 5.16* 4.77*   Estimated Creatinine Clearance: 13.1 ml/min (by C-G formula based on Cr of 4.77). No results found for this basename: VANCOTROUGH, VANCOPEAK, VANCORANDOM, GENTTROUGH, GENTPEAK, GENTRANDOM, TOBRATROUGH, TOBRAPEAK, TOBRARND, AMIKACINPEAK, AMIKACINTROU, AMIKACIN,  in the last 72 hours   Microbiology: Recent Results (from the past 720 hour(s))  WOUND CULTURE     Status: None   Collection Time    01/17/14  8:05 AM      Result Value Ref Range Status   Specimen Description LEG LEFT   Final   Special Requests NONE   Final   Gram Stain     Final   Value: RARE SQUAMOUS EPITHELIAL CELLS PRESENT     MODERATE WBC PRESENT, PREDOMINANTLY PMN     FEW GRAM POSITIVE COCCI IN CLUSTERS     MODERATE GRAM NEGATIVE RODS     RARE GRAM POSITIVE RODS   Culture     Final   Value: MODERATE STAPHYLOCOCCUS AUREUS     Note: RIFAMPIN AND GENTAMICIN SHOULD NOT BE USED AS SINGLE DRUGS FOR TREATMENT OF STAPH INFECTIONS.     MODERATE CITROBACTER AMALONATICUS     Performed at [facility]   Report Status 01/21/2014 FINAL   Final   Organism ID, Bacteria STAPHYLOCOCCUS AUREUS   Final   Organism ID, Bacteria CITROBACTER  AMALONATICUS   Final  CULTURE, BLOOD (ROUTINE X 2)     Status: None   Collection Time    01/17/14  9:30 AM      Result Value Ref Range Status   Specimen Description BLOOD RIGHT ARM   Final   Special Requests     Final   Value: BOTTLES DRAWN AEROBIC AND ANAEROBIC 10CC BLUE, 5CC RED   Culture  Setup Time     Final   Value: 01/17/2014 20:29     Performed at [facility]   Culture     Final   Value: NO GROWTH 5 DAYS     Performed at [facility]   Report Status 01/23/2014 FINAL   Final  CULTURE, BLOOD (ROUTINE X 2)     Status: None   Collection Time    01/17/14  9:40 AM      Result Value Ref Range Status   Specimen Description BLOOD RIGHT HAND   Final   Special Requests BOTTLES DRAWN AEROBIC ONLY [facility]   Final   Culture  Setup Time     Final   Value: 01/17/2014 20:29     Performed at [facility]   Culture     Final   Value: NO GROWTH 5 DAYS     Performed at [facility]   Report  Status 01/23/2014 FINAL   Final  MRSA PCR SCREENING     Status: None   Collection Time    01/17/14  9:33 PM      Result Value Ref Range Status   MRSA by PCR NEGATIVE  NEGATIVE Final   Comment:            The GeneXpert MRSA Assay (FDA     approved for NASAL specimens     only), is one component of a     comprehensive MRSA colonization     surveillance program. It is not     intended to diagnose MRSA     infection nor to guide or     monitor treatment for     MRSA infections.    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 Returned to the ED 8/18 with fever, ongoing wound infection, and loss of pulses in his LLE. Level 2 code sepsis called. On day # 3/4  vancomycin and zosyn.  WBC 9.5; tmax 100.2.  L AKA planned for Monday 8/24.   To get  HD today and again tomorrow/Saturday and not on Monday to prepare for OR on Monday.   vanc 8/18>> Zosyn 8/19>>  Goal of Therapy:  Vancomycin pre-HD level 15-25  Plan:   Continue vanc 1gm post-HD MWF- additional dose scheduled for Saturday as will get an extra session prior to AKA on Monday (not getting HD Monday)- order entered Continue zosyn 2.25gm IV Q8H  F/u renal plans, C&S, clinical status and pre-HD vanc level when appropriate  Eudelia Bunch, Pharm.D. [phone] 01/30/2014 1:50 PM

## 2014-01-30 NOTE — Procedures (Signed)
I was present at this dialysis session. I have reviewed the session itself and made appropriate changes.   Goal UF 2.4L.  Cont to have low grade fevers on ABX.  AKA on Monday.  HD again tomorrow to get off schedule.  Keep EDW at 72kg.   Pearson Grippe  MD 01/30/2014, 2:48 PM

## 2014-01-30 NOTE — Progress Notes (Signed)
Pt came back to the unit at shift change, around 1905 from Hemodialysis, alert and oriented to self. Vital signs checked, afebrile, BP=76/7mmHg. On-call MD, Dr. Sherral Hammers notified and responded. No further orders made but to monitor BP every 2 hours until stable. Will continue to monitor pt closely.

## 2014-01-30 NOTE — Progress Notes (Signed)
[facility] PROGRESS NOTE  Sean [name] XKG:[id number] DOB: 1936/03/06 DOA: 01/27/2014 PCP: Philis Fendt, MD  Assessment/Plan: 1. Left leg calciphylalactic unstageable open wound between the knee and the ankle showing bone and muscles was absent to ounces of the dorsalis pedis and the posterior tibialis.   -Continue pain management -agree that Given his comorbidities -he will definitely be high risk for surgery however without it he will probably die  -AKA now planned for Monday 8/24 per Dr Oneida Alar- Pt/family agreeable -Fever 100.4 last p.m. secondary to wound infection. BP stable -Continue IV antibiotics vancomycin and Zosyn  2. History of CAD with multiple MIs in the past - last nuclear test done on 7/5 showed no ischemia but an ejection fraction of 25%. -His chest pain-free, continue outpatient medications  3. End-stage on hemodialysis followed by. center [place] surgery and is scheduled for Monday Wednesday and Friday  -Dialysis per renal -note as per Dr Oneida Alar pt will need his dialysis schedule adjusted so he can be dialzed prior to planned surgery on 8/24 4, advanced dementia -On no meds, follow  Code Status: full Family Communication: None at bedside  Disposition Plan: Keep in step down for today   Consultants:  Vascular  Procedures:  None  Antibiotics:  Vancomycin and Zosyn started on 818  HPI/Subjective:  seen in dialysis, states left leg not as sore today.  Objective: Filed Vitals:   01/30/14 1543  BP: 89/55  Pulse: 86  Temp:   Resp:     Intake/Output Summary (Last 24 hours) at 01/30/14 1550 Last data filed at 01/30/14 1300  Gross per 24 hour  Intake    480 ml  Output      0 ml  Net    480 ml   Filed Weights   01/28/14 1632 01/29/14 2057 01/30/14 1421  Weight: 82.9 kg (182 lb 12.2 oz) 83.7 kg (184 lb 8.4 oz) 83.4 kg (183 lb 13.8 oz)    Exam:  General: alert  In NAD, answers simple questions appropriately Cardiovascular: RRR, nl S1  s2 Respiratory: Decreased breath sounds at bases, no crackles or wheezes Abdomen: soft +BS NT/ND, no masses palpable Extremities: Left lower extremity with dressing clean and dry, right lower extremity with no cyanosis and no edema    Data Reviewed: Basic Metabolic Panel:  Recent Labs Lab 01/27/14 2100 01/28/14 0318 01/30/14 0720  NA 135* 134* 133*  K 4.3 4.6 4.0  CL 94* 95* 93*  CO2 29 24 24   GLUCOSE 117* 108* 91  BUN 13 14 16   CREATININE 4.80* 5.16* 4.77*  CALCIUM 8.7 8.7 8.4   Liver Function Tests:  Recent Labs Lab 01/27/14 2100  AST 28  ALT 9  ALKPHOS 85  BILITOT 0.6  PROT 7.3  ALBUMIN 1.9*   No results found for this basename: LIPASE, AMYLASE,  in the last 168 hours No results found for this basename: AMMONIA,  in the last 168 hours CBC:  Recent Labs Lab 01/27/14 2100 01/28/14 0318 01/30/14 0720  WBC 10.9* 10.5 9.5  NEUTROABS 8.1*  --   --   HGB 8.5* 8.8* 8.1*  HCT 27.4* 28.5* 25.7*  MCV 86.2 85.6 86.2  PLT 295 316 291   Cardiac Enzymes: No results found for this basename: CKTOTAL, CKMB, CKMBINDEX, TROPONINI,  in the last 168 hours BNP (last 3 results)  Recent Labs  12/13/13 1408 01/17/14 0800  PROBNP >70000.0* >70000.0*   CBG:  Recent Labs Lab 01/29/14 1222 01/29/14 1538 01/29/14 2051 01/30/14 0754 01/30/14  1210  GLUCAP 113* 98 84 98 109*    No results found for this or any previous visit (from the past 240 hour(s)).   Studies: Ir Fluoro Guide Cv Line Right  01/29/2014   CLINICAL DATA:  Leg infection  EXAM: RIGHT INTERNAL JUGULAR PICC LINE PLACEMENT WITH ULTRASOUND AND FLUOROSCOPIC GUIDANCE  FLUOROSCOPY TIME:  1 min and 18 seconds.  PROCEDURE: The patient was advised of the possible risks andcomplications and agreed to undergo the procedure. The patient was then brought to the angiographic suite for the procedure.  The right neck was prepped with chlorhexidine, drapedin the usual sterile fashion using maximum barrier technique (cap  and mask, sterile gown, sterile gloves, large sterile sheet, hand hygiene and cutaneous antisepsis) and infiltrated locally with 1% Lidocaine.  Ultrasound demonstrated patency of the right internal jugular vein, and this was documented with an image. Under real-time ultrasound guidance, this vein was accessed with a 21 gauge micropuncture needle and image documentation was performed. A 0.018 wire was introduced in to the vein. Over this, a 5 [place] double lumen Power PICC was advanced to the lower SVC/right atrial junction. Fluoroscopy during the procedure and fluoro spot radiograph confirms appropriate catheter position. The catheter was flushed and covered with asterile dressing.  Complications: None  IMPRESSION: Successful right arm Power PICC line placement with ultrasound and fluoroscopic guidance. The catheter is ready for use.   Electronically Signed   By: Maryclare Bean M.D.   On: 01/29/2014 17:00   Ir US Guide Vasc Access Right  01/29/2014   CLINICAL DATA:  Leg infection  EXAM: RIGHT INTERNAL JUGULAR PICC LINE PLACEMENT WITH ULTRASOUND AND FLUOROSCOPIC GUIDANCE  FLUOROSCOPY TIME:  1 min and 18 seconds.  PROCEDURE: The patient was advised of the possible risks andcomplications and agreed to undergo the procedure. The patient was then brought to the angiographic suite for the procedure.  The right neck was prepped with chlorhexidine, drapedin the usual sterile fashion using maximum barrier technique (cap and mask, sterile gown, sterile gloves, large sterile sheet, hand hygiene and cutaneous antisepsis) and infiltrated locally with 1% Lidocaine.  Ultrasound demonstrated patency of the right internal jugular vein, and this was documented with an image. Under real-time ultrasound guidance, this vein was accessed with a 21 gauge micropuncture needle and image documentation was performed. A 0.018 wire was introduced in to the vein. Over this, a 5 [place] double lumen Power PICC was advanced to the lower SVC/right  atrial junction. Fluoroscopy during the procedure and fluoro spot radiograph confirms appropriate catheter position. The catheter was flushed and covered with asterile dressing.  Complications: None  IMPRESSION: Successful right arm Power PICC line placement with ultrasound and fluoroscopic guidance. The catheter is ready for use.   Electronically Signed   By: Maryclare Bean M.D.   On: 01/29/2014 17:00    Scheduled Meds: . atorvastatin  20 mg Oral QHS  . carvedilol  3.125 mg Oral BID WC  . darbepoetin (ARANESP) injection - DIALYSIS  150 mcg Intravenous Q Wed-HD  . feeding supplement (RESOURCE BREEZE)  1 Container Oral BID BM  . ferric gluconate (FERRLECIT/NULECIT) IV  62.5 mg Intravenous Q Wed-HD  . heparin  5,000 Units Subcutaneous 3 times per day  . heparin  6,000 Units Dialysis Once in dialysis  . [START ON 01/31/2014] heparin  6,000 Units Dialysis Once in dialysis  . isosorbide-hydrALAZINE  1 tablet Oral BID  . PARoxetine  20 mg Oral Daily  . piperacillin-tazobactam (ZOSYN)  IV  2.25  g Intravenous 3 times per day  . sodium chloride  3 mL Intravenous Q12H  . vancomycin  1,000 mg Intravenous Q M,W,F-HD  . [START ON 01/31/2014] vancomycin  1,000 mg Intravenous Once   Continuous Infusions:   Principal Problem:   Open leg wound    Time spent: [facility] Pager [phone]. If 7PM-7AM, please contact night-coverage at [email or web address], password [facility] 01/30/2014, 3:50 PM  LOS: 3 days

## 2014-01-30 NOTE — Progress Notes (Addendum)
CARE MANAGEMENT NOTE 01/30/2014  Patient:  Sean [name], Sean [name]   Account Number:  [id number]  Date Initiated:  01/28/2014  Documentation initiated by:  Marvetta Gibbons  Subjective/Objective Assessment:   Pt admitted with fever, open leg wound-infected     Action/Plan:   PTA pt was at [place] consulted   Anticipated DC Date:  02/02/2014   Anticipated DC Plan:  [place] referral  Clinical Social Worker      DC Planning Services  CM consult      Choice offered to / List presented to:             Status of service:  In process, will continue to follow Medicare Important Message given?   (If response is "NO", the following Medicare IM given date fields will be blank) Date Medicare IM given:   Medicare IM given by:   Date Additional Medicare IM given:   Additional Medicare IM given by:    Discharge Disposition:    Per UR Regulation:  Reviewed for med. necessity/level of care/duration of stay  If discussed at [place] of Stay Meetings, dates discussed:    Comments:  Daughter- Sean [name]- [phone]  01/29/14- 65- Marvetta Gibbons RN, BSN [phone] Spoke with pt's daughter Chinita regarding plans for discharge- pt was at [facility] however daughter states that she does not wish for him to return there and plans to go pick up pts belongings and inform them that she will not be holding a bed- daughter has gone to [facility] and likes it- and wants to look at placement there as first choice- she also stated that MD had mentioned CIR here prior to discharge to SNF- explained to daughter that discharge to CIR was a process of referral- and that post op we would have to have an MD order for CIR to come do an assessment of her dad to see if he would be a good candidate for CIR and meet their criteria for admission- explained that if he did not then we could proceed with placement in a SNF such as [facility]- will have CSW f/u with daughter  regarding placement needs- pt for OR on Monday for amputation.

## 2014-01-31 DIAGNOSIS — S81809A Unspecified open wound, unspecified lower leg, initial encounter: Secondary | ICD-10-CM

## 2014-01-31 LAB — GLUCOSE, CAPILLARY
Glucose-Capillary: 81 mg/dL (ref 70–99)
Glucose-Capillary: 101 mg/dL — ABNORMAL HIGH (ref 70–99)

## 2014-01-31 MED ORDER — HYDROXYZINE HCL 10 MG PO TABS
10.0000 mg | ORAL_TABLET | Freq: Three times a day (TID) | ORAL | Status: DC | PRN
  Administered 2014-01-31 – 2014-02-01 (×3): 10 mg via ORAL
  Filled 2014-01-31 (×4): qty 1

## 2014-01-31 NOTE — Progress Notes (Signed)
[facility] PROGRESS NOTE  Sean [name] CXK:[id number] DOB: August 13, 1935 DOA: 01/27/2014 PCP: Philis Fendt, MD  Assessment/Plan: 1. Left leg calciphylalactic unstageable open wound between the knee and the ankle showing bone and muscles was absent to ounces of the dorsalis pedis and the posterior tibialis.   -Continue pain management -agree that Given his comorbidities -he will definitely be high risk for surgery however without it he will probably die  -AKA now planned for Monday 8/24 per Dr Oneida Alar- Pt/family agreeable -Fever 102 today, will repeat culture. secondary to wound infection.continue to monitor closely -Continue IV antibiotics vancomycin and Zosyn  2. History of CAD with multiple MIs in the past - last nuclear test done on 7/5 showed no ischemia but an ejection fraction of 25%. -His chest pain-free, continue outpatient medications  3. End-stage on hemodialysis followed by. center [place] surgery and is scheduled for Monday Wednesday and Friday  -Dialysis per renal -note as per Dr Oneida Alar pt will need his dialysis schedule adjusted so he can be dialyzed prior to planned surgery on 8/24 -add atarx prn for pruritis 4, advanced dementia -On no meds, follow  Code Status: full Family Communication: daughter at bedside  Disposition Plan: Keep in step down for today   Consultants:  Vascular  Procedures:  None  Antibiotics:  Vancomycin and Zosyn started on 818  HPI/Subjective:  Daughter at bedside and frustrated- stating that pt did not get his meds on time and requesting if his hands can be restained so that he does not pull IV or keep scratching his wound. Pt noted to be intermittently scratching all over. Objective: Filed Vitals:   01/31/14 1300  BP:   Pulse:   Temp: 102 F (38.9 C)  Resp:     Intake/Output Summary (Last 24 hours) at 01/31/14 1523 Last data filed at 01/31/14 1300  Gross per 24 hour  Intake    560 ml  Output   2076 ml  Net  -1516  ml   Filed Weights   01/30/14 1824 01/31/14 0731 01/31/14 1114  Weight: 81.8 kg (180 lb 5.4 oz) 82.1 kg (181 lb) 81.6 kg (179 lb 14.3 oz)    Exam:  General: alert  In NAD, answers simple questions appropriately Cardiovascular: RRR, nl S1 s2 Respiratory: Decreased breath sounds at bases, no crackles or wheezes Abdomen: soft +BS NT/ND, no masses palpable Extremities: Left lower extremity with dressing clean and dry, right lower extremity with no cyanosis and no edema    Data Reviewed: Basic Metabolic Panel:  Recent Labs Lab 01/27/14 2100 01/28/14 0318 01/30/14 0720  NA 135* 134* 133*  K 4.3 4.6 4.0  CL 94* 95* 93*  CO2 29 24 24   GLUCOSE 117* 108* 91  BUN 13 14 16   CREATININE 4.80* 5.16* 4.77*  CALCIUM 8.7 8.7 8.4   Liver Function Tests:  Recent Labs Lab 01/27/14 2100  AST 28  ALT 9  ALKPHOS 85  BILITOT 0.6  PROT 7.3  ALBUMIN 1.9*   No results found for this basename: LIPASE, AMYLASE,  in the last 168 hours No results found for this basename: AMMONIA,  in the last 168 hours CBC:  Recent Labs Lab 01/27/14 2100 01/28/14 0318 01/30/14 0720  WBC 10.9* 10.5 9.5  NEUTROABS 8.1*  --   --   HGB 8.5* 8.8* 8.1*  HCT 27.4* 28.5* 25.7*  MCV 86.2 85.6 86.2  PLT 295 316 291   Cardiac Enzymes: No results found for this basename: CKTOTAL, CKMB, CKMBINDEX, TROPONINI,  in  the last 168 hours BNP (last 3 results)  Recent Labs  12/13/13 1408 01/17/14 0800  PROBNP >70000.0* >70000.0*   CBG:  Recent Labs Lab 01/29/14 2051 01/30/14 0754 01/30/14 1210 01/30/14 2116 01/31/14 1158  GLUCAP 84 98 109* 97 81    No results found for this or any previous visit (from the past 240 hour(s)).   Studies: Ir Fluoro Guide Cv Line Right  01/29/2014   CLINICAL DATA:  Leg infection  EXAM: RIGHT INTERNAL JUGULAR PICC LINE PLACEMENT WITH ULTRASOUND AND FLUOROSCOPIC GUIDANCE  FLUOROSCOPY TIME:  1 min and 18 seconds.  PROCEDURE: The patient was advised of the possible risks  andcomplications and agreed to undergo the procedure. The patient was then brought to the angiographic suite for the procedure.  The right neck was prepped with chlorhexidine, drapedin the usual sterile fashion using maximum barrier technique (cap and mask, sterile gown, sterile gloves, large sterile sheet, hand hygiene and cutaneous antisepsis) and infiltrated locally with 1% Lidocaine.  Ultrasound demonstrated patency of the right internal jugular vein, and this was documented with an image. Under real-time ultrasound guidance, this vein was accessed with a 21 gauge micropuncture needle and image documentation was performed. A 0.018 wire was introduced in to the vein. Over this, a 5 [place] double lumen Power PICC was advanced to the lower SVC/right atrial junction. Fluoroscopy during the procedure and fluoro spot radiograph confirms appropriate catheter position. The catheter was flushed and covered with asterile dressing.  Complications: None  IMPRESSION: Successful right arm Power PICC line placement with ultrasound and fluoroscopic guidance. The catheter is ready for use.   Electronically Signed   By: Maryclare Bean M.D.   On: 01/29/2014 17:00   Ir US Guide Vasc Access Right  01/29/2014   CLINICAL DATA:  Leg infection  EXAM: RIGHT INTERNAL JUGULAR PICC LINE PLACEMENT WITH ULTRASOUND AND FLUOROSCOPIC GUIDANCE  FLUOROSCOPY TIME:  1 min and 18 seconds.  PROCEDURE: The patient was advised of the possible risks andcomplications and agreed to undergo the procedure. The patient was then brought to the angiographic suite for the procedure.  The right neck was prepped with chlorhexidine, drapedin the usual sterile fashion using maximum barrier technique (cap and mask, sterile gown, sterile gloves, large sterile sheet, hand hygiene and cutaneous antisepsis) and infiltrated locally with 1% Lidocaine.  Ultrasound demonstrated patency of the right internal jugular vein, and this was documented with an image. Under real-time  ultrasound guidance, this vein was accessed with a 21 gauge micropuncture needle and image documentation was performed. A 0.018 wire was introduced in to the vein. Over this, a 5 [place] double lumen Power PICC was advanced to the lower SVC/right atrial junction. Fluoroscopy during the procedure and fluoro spot radiograph confirms appropriate catheter position. The catheter was flushed and covered with asterile dressing.  Complications: None  IMPRESSION: Successful right arm Power PICC line placement with ultrasound and fluoroscopic guidance. The catheter is ready for use.   Electronically Signed   By: Maryclare Bean M.D.   On: 01/29/2014 17:00    Scheduled Meds: . atorvastatin  20 mg Oral QHS  . carvedilol  3.125 mg Oral BID WC  . darbepoetin (ARANESP) injection - DIALYSIS  150 mcg Intravenous Q Wed-HD  . feeding supplement (RESOURCE BREEZE)  1 Container Oral BID BM  . ferric gluconate (FERRLECIT/NULECIT) IV  62.5 mg Intravenous Q Wed-HD  . heparin  5,000 Units Subcutaneous 3 times per day  . heparin  6,000 Units Dialysis Once in dialysis  .  isosorbide-hydrALAZINE  1 tablet Oral BID  . PARoxetine  20 mg Oral Daily  . piperacillin-tazobactam (ZOSYN)  IV  2.25 g Intravenous 3 times per day  . sodium chloride  3 mL Intravenous Q12H  . vancomycin  1,000 mg Intravenous Q M,W,F-HD   Continuous Infusions:   Principal Problem:   Open leg wound    Time spent: [facility] Pager [phone]. If 7PM-7AM, please contact night-coverage at [email or web address], password [facility] 01/31/2014, 3:23 PM  LOS: 4 days

## 2014-01-31 NOTE — Procedures (Signed)
I was present at this dialysis session. I have reviewed the session itself and made appropriate changes.   Attempting EDW 81kg.  Pt ASx.  Off schedule for amputation on Monday.    Pearson Grippe  MD 01/31/2014, 7:53 AM

## 2014-01-31 NOTE — Progress Notes (Signed)
Patient ID: Sean [name], male   DOB: 1935-07-08, 78 y.o.   MRN: [id number]     [facility]  Allergies  Allergen Reactions  . Benazepril Hcl Hives, Itching and Rash    flareups on patient head     Chief Complaint  Patient presents with  . Discharge Note    HPI:  He is being discharged to home with home health for pt/ot/rn-wounds/aid. Therapy is to improve upon strength, mobility and independence with adl's.  He will need a wheelchair with leg rests and 3:1 commode. He will need prescriptions written and a follow up with his pcp.  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   Past Medical History  Diagnosis Date  . Hyperparathyroidism   . Arthritis   . Hyperlipidemia   . Hypertension   . Neuromuscular disorder   . Stroke   . GERD (gastroesophageal reflux disease)   . Headache(784.0)     occasional  migraine   . Heart attack 2011; 1990's  . Type II diabetes mellitus   . Peripheral neuropathy   . Anxiety   . Depression   . Dialysis patient     M, W, F; [place] (09/19/11)  . Chronic kidney disease     [facility] MWF    Past Surgical History  Procedure Laterality Date  . Colon surgery  2011    colon resection   . Parathyroidectomy  2011  . Capd insertion  09/12/2011    Procedure: LAPAROSCOPIC INSERTION CONTINUOUS AMBULATORY PERITONEAL DIALYSIS  (CAPD) CATHETER;  Surgeon: Edward Jolly, MD;  Location: [facility];  Service: General;  Laterality: N/A;  . Sp av dialysis shunt access existing *l*  2003  . Dg av dialysis shunt intro ndl*r* or      "not working" (09/19/11)  . Capd removal  03/12/2012    Procedure: CONTINUOUS AMBULATORY PERITONEAL DIALYSIS  (CAPD) CATHETER REMOVAL;  Surgeon: Edward Jolly, MD;  Location: [facility];  Service: General;  Laterality: N/A;    VITAL SIGNS BP 117/84  Pulse 68  Ht 5' (1.524 m)  Wt 190 lb (86.183 kg)  BMI 37.11 kg/m2   Patient's Medications  New Prescriptions   No medications on  file  Previous Medications   ACETAMINOPHEN (TYLENOL) 325 MG TABLET    Take 2 tablets (650 mg total) by mouth every 6 (six) hours as needed for mild pain, fever or headache.   ALBUTEROL (PROVENTIL) (2.5 MG/3ML) 0.083% NEBULIZER SOLUTION    Take 3 mLs (2.5 mg total) by nebulization every 6 (six) hours as needed for wheezing or shortness of breath.   ASPIRIN 325 MG TABLET    Take 325 mg by mouth every evening.   ATORVASTATIN (LIPITOR) 20 MG TABLET    Take 20 mg by mouth at bedtime.   B COMPLEX-VITAMIN C-FOLIC ACID (NEPHRO-VITE) 0.8 MG TABS    Take 0.8 mg by mouth at bedtime.   CARVEDILOL (COREG) 3.125 MG TABLET    Take 1 tablet (3.125 mg total) by mouth 2 (two) times daily with a meal.   COLLAGENASE (SANTYL) OINTMENT    Apply 1 application topically daily.   FEEDING SUPPLEMENT, RESOURCE BREEZE, (RESOURCE BREEZE) LIQD    Take 1 Container by mouth 2 (two) times daily between meals.   HYDROCODONE-ACETAMINOPHEN (NORCO/VICODIN) 5-325 MG PER TABLET    Take 1 tablet by mouth every 6 (six) hours as needed for moderate pain or severe pain.   HYDROXYZINE (ATARAX/VISTARIL) 10  MG TABLET    Take 10 mg by mouth at bedtime as needed (for sleep).    ISOSORBIDE-HYDRALAZINE (BIDIL) 20-37.5 MG PER TABLET    Take 1 tablet by mouth 2 (two) times daily.   LEVOFLOXACIN (LEVAQUIN) 500 MG TABLET    Take 1 tablet (500 mg total) by mouth every other day. X 2 weeks   PAROXETINE (PAXIL) 20 MG TABLET    Take 1 tablet (20 mg total) by mouth daily.  Modified Medications   No medications on file  Discontinued Medications   No medications on file    SIGNIFICANT DIAGNOSTIC EXAMS   12-13-13: chest x-ray: No active disease.  12-13-13: ct of head: Old left posterior parietal infarct with encephalomalacia. Atrophy, chronic small vessel disease. No acute intracranial abnormality.  12-14-13: myocardial perfusion: 1. Prior MI involving the inferior wall and apex. 2. No evidence of myocardial ischemia. 3. Left ventricular enlargement.4.  Global hypokinesis with essential akinesis in the inferior wall. 5. Estimated QGS ejection fraction 25%.   12-15-13: TEE: Left ventricle: Poorly visualized consider repeat with contrast Abnormal septal motion. The cavity size was mildly dilated. Wall thickness was normal. Systolic function was mildly reduced. The estimated ejection fraction was in the range of 45% to 50%. - Mitral valve: Calcified annulus. - Left atrium: The atrium was mildly dilated. - Atrial septum: No defect or patent foramen ovale was identified.  12-20-13: chest x-ray: LEFT basilar atelectasis.  01-07-14: chest x-ray: negative for tb. Cardiomegaly and central pulmonary vascular congestion without other overt chf findings.     LABS REVIEWED:   12-20-13: wbc 7.8; hgb 10.3; hct 32.0; mcv 84.2; plt 316; glucose 153; bun 34; creat 8.78; k+3.7; na++136; liver normal albumin 2.7 12-21-13: tsh 1.050; vit b12: 1157 12-24-13; wbc 7.6;hgb 8.6; hct 27.3; mcv 86.4; plt 314; glucose 127; bun 34; creat 8.72; k+4.5; na++135; phos 3.8; albumin 2.4   Review of Systems  Constitutional: Negative for malaise/fatigue.  Respiratory: Negative for cough and shortness of breath.   Cardiovascular: Positive for leg swelling. Negative for chest pain and palpitations.  Gastrointestinal: Negative for heartburn, abdominal pain and constipation.  Musculoskeletal:       Has left lower extremity pain   Skin:       Has painful left lower leg sores  Psychiatric/Behavioral: Negative for depression. The patient is not nervous/anxious.     Physical Exam  Constitutional: No distress.  obese  Neck: Neck supple. No JVD present.  Cardiovascular: Normal rate, regular rhythm and intact distal pulses.   Respiratory: Effort normal and breath sounds normal. No respiratory distress. He has no wheezes.  GI: Soft. Bowel sounds are normal. He exhibits no distension. There is no tenderness.  Musculoskeletal: He exhibits edema.  Has 2+ lower extremity edema Is able  to move all extremities   Neurological: He is alert.  Skin: Skin is warm and dry. He is not diaphoretic.  Left lower arm a/v fistula +thrill+bruit Left lower extremity 18 x 10 x 0 cm with purulent drainage and foul odor 70% eschar present. Is treated with santyl and ace wrap daily.      ASSESSMENT/ PLAN:  He will be discharged to home with home health for pt/ot/rn-wound/aid. He will need a wheelchair with leg rests and 3:1 commode. His prescriptions have been written for a 30 day supply and vicodin 5/325 #30 tabs. He has a follow up appointment with Dr. Burna Forts on 01-27-14 at 10 am.    Time spent with patient 45 minutes.

## 2014-02-01 LAB — GLUCOSE, CAPILLARY
GLUCOSE-CAPILLARY: 116 mg/dL — AB (ref 70–99)
Glucose-Capillary: 152 mg/dL — ABNORMAL HIGH (ref 70–99)
Glucose-Capillary: 133 mg/dL — ABNORMAL HIGH (ref 70–99)
Glucose-Capillary: 141 mg/dL — ABNORMAL HIGH (ref 70–99)

## 2014-02-01 LAB — BASIC METABOLIC PANEL
Anion gap: 12 (ref 5–15)
BUN: 8 mg/dL (ref 6–23)
CALCIUM: 8.4 mg/dL (ref 8.4–10.5)
CHLORIDE: 96 meq/L (ref 96–112)
CO2: 29 mEq/L (ref 19–32)
Creatinine, Ser: 2.72 mg/dL — ABNORMAL HIGH (ref 0.50–1.35)
GFR calc Af Amer: 25 mL/min — ABNORMAL LOW (ref >90)
GFR calc non Af Amer: 21 mL/min — ABNORMAL LOW (ref >90)
GLUCOSE: 153 mg/dL — AB (ref 70–99)
Potassium: 4.1 mEq/L (ref 3.7–5.3)
Sodium: 137 mEq/L (ref 137–147)

## 2014-02-01 MED ORDER — VANCOMYCIN HCL IN DEXTROSE 1-5 GM/200ML-% IV SOLN
1000.0000 mg | INTRAVENOUS | Status: DC
  Filled 2014-02-01 (×2): qty 200

## 2014-02-01 NOTE — Progress Notes (Signed)
Nurse  came into the patient's room to give his 10 am medicines,Shanita refused to awekened her father from his sleep.

## 2014-02-01 NOTE — Progress Notes (Signed)
    Subjective  -   comfortable   Physical Exam:  Left leg wound with dressing intact Currently on HD        Assessment/Plan: Left leg calciphylaxis  I saw the patient yesterday in dialysis.  He is scheduled for left leg amputation by Dr. Oneida Alar on Monday.  I discussed this via telephone with the family.  All their questions were answered.  He will be n.p.o. after midnight  Omran Keelin IV, V. WELLS 02/01/2014 9:40 AM --  Filed Vitals:   02/01/14 0416  BP: 100/49  Pulse: 84  Temp: 98 F (36.7 C)  Resp: 18    Intake/Output Summary (Last 24 hours) at 02/01/14 0940 Last data filed at 01/31/14 1906  Gross per 24 hour  Intake    220 ml  Output    400 ml  Net   -180 ml     Laboratory CBC    Component Value Date/Time   WBC 9.5 01/30/2014 0720   HGB 8.1* 01/30/2014 0720   HCT 25.7* 01/30/2014 0720   PLT 291 01/30/2014 0720    BMET    Component Value Date/Time   NA 137 02/01/2014 0722   K 4.1 02/01/2014 0722   CL 96 02/01/2014 0722   CO2 29 02/01/2014 0722   GLUCOSE 153* 02/01/2014 0722   BUN 8 02/01/2014 0722   CREATININE 2.72* 02/01/2014 0722   CALCIUM 8.4 02/01/2014 0722   GFRNONAA 21* 02/01/2014 0722   GFRAA 25* 02/01/2014 0722    COAG Lab Results  Component Value Date   INR 1.37 01/28/2014   INR 1.04 12/16/2013   INR 1.07 06/27/2009   No results found for this basename: PTT    Antibiotics Anti-infectives   Start     Dose/Rate Route Frequency Ordered Stop   02/04/14 1200  vancomycin (VANCOCIN) IVPB 1000 mg/200 mL premix     1,000 mg 200 mL/hr over 60 Minutes Intravenous Every M-W-F (Hemodialysis) 02/01/14 0819     01/31/14 1200  vancomycin (VANCOCIN) IVPB 1000 mg/200 mL premix     1,000 mg 200 mL/hr over 60 Minutes Intravenous  Once 01/29/14 1329 01/31/14 1111   01/28/14 1200  vancomycin (VANCOCIN) IVPB 1000 mg/200 mL premix  Status:  Discontinued     1,000 mg 200 mL/hr over 60 Minutes Intravenous Every M-W-F (Hemodialysis) 01/27/14 2005 02/01/14 0819   01/28/14 0430  piperacillin-tazobactam (ZOSYN) IVPB 2.25 g     2.25 g 100 mL/hr over 30 Minutes Intravenous 3 times per day 01/27/14 2005     01/27/14 2015  vancomycin (VANCOCIN) 2,000 mg in sodium chloride 0.9 % 500 mL IVPB     2,000 mg 250 mL/hr over 120 Minutes Intravenous  Once 01/27/14 2005 01/27/14 2336   01/27/14 2015  piperacillin-tazobactam (ZOSYN) IVPB 3.375 g     3.375 g 100 mL/hr over 30 Minutes Intravenous  Once 01/27/14 2005 01/28/14 0430       V. Leia Alf, M.D. [facility] Office: [phone] Pager:  [phone]

## 2014-02-01 NOTE — Progress Notes (Signed)
Nurse and the N.A. came into the patient's room to rendered bedside care and give his morning med,Sean [name] refused,was telling to the staffs that she doesn't to awakened her father's sleep.

## 2014-02-01 NOTE — Progress Notes (Signed)
Subjective:   Sleeping, daughter at bedside with many questions  Objective Filed Vitals:   01/31/14 1300 01/31/14 2034 02/01/14 0416 02/01/14 0940  BP:  101/50 100/49 117/55  Pulse:  77 84 88  Temp: 102 F (38.9 C) 101 F (38.3 C) 98 F (36.7 C) 98 F (36.7 C)  TempSrc: Oral Oral Axillary Axillary  Resp:  18 18 18   Height:      Weight:      SpO2:  100% 100% 100%   Physical Exam General: sleeping, comfortable. No acute distress Heart: RRR 2/6 SEM Lungs: CTA, unlabored Abdomen: soft, +BS Extremities: L leg dressing intact. No edema Dialysis Access:  L AVF +b/t  Dialysis Orders: Center: [facility] on MWF .  EDW 82kg HD Bath 4.0k, 2.25ca Time 4.o hr Heparin 6,000. Access LUA AVG BFR 450 DFR 1.5  0 Vit D HD Arasnep 180mcg qwed Units IV/HD Venofer 100 mg q weekly hd  OP Lab ipth 647 12/31/13   Assessment/Plan: 1. L leg wound- for AKA 8/24. Blood cultures pending. Vanc and zosyn.  2. ESRD - MWF, HD Saturday in anticipation for OR Monday, next HD Tuesday then get back on schedule 3. Anemia -  hgb 8.1. Aranesp q wends. Watch post op 4. Secondary hyperparathyroidism - Ca+ 8.4, no Vit D with concerns for calciphylaxis. 5. HTN/volume - 117/55. Coreg and bidil. under edw, will need new EDW post ampuation.  6. Nutrition - renal diet. supplements  Shelle Iron, NP [place] [phone] 02/01/2014,10:10 AM  LOS: 5 days    Additional Objective Labs: Basic Metabolic Panel:  Recent Labs Lab 01/28/14 0318 01/30/14 0720 02/01/14 0722  NA 134* 133* 137  K 4.6 4.0 4.1  CL 95* 93* 96  CO2 24 24 29   GLUCOSE 108* 91 153*  BUN 14 16 8   CREATININE 5.16* 4.77* 2.72*  CALCIUM 8.7 8.4 8.4   Liver Function Tests:  Recent Labs Lab 01/27/14 2100  AST 28  ALT 9  ALKPHOS 85  BILITOT 0.6  PROT 7.3  ALBUMIN 1.9*   No results found for this basename: LIPASE, AMYLASE,  in the last 168 hours CBC:  Recent Labs Lab 01/27/14 2100 01/28/14 0318 01/30/14 0720   WBC 10.9* 10.5 9.5  NEUTROABS 8.1*  --   --   HGB 8.5* 8.8* 8.1*  HCT 27.4* 28.5* 25.7*  MCV 86.2 85.6 86.2  PLT 295 316 291   Blood Culture    Component Value Date/Time   SDES BLOOD RIGHT HAND 01/17/2014 0940   SPECREQUEST BOTTLES DRAWN AEROBIC ONLY [place] 01/17/2014 0940   CULT  Value: NO GROWTH 5 DAYS Performed at [facility] 01/17/2014 0940   REPTSTATUS 01/23/2014 FINAL 01/17/2014 0940    Cardiac Enzymes: No results found for this basename: CKTOTAL, CKMB, CKMBINDEX, TROPONINI,  in the last 168 hours CBG:  Recent Labs Lab 01/30/14 1210 01/30/14 2116 01/31/14 1158 01/31/14 1616 02/01/14 0759  GLUCAP 109* 97 81 101* 152*   Iron Studies: No results found for this basename: IRON, TIBC, TRANSFERRIN, FERRITIN,  in the last 72 hours @lablastinr3 @ Studies/Results: No results found. Medications:   . atorvastatin  20 mg Oral QHS  . carvedilol  3.125 mg Oral BID WC  . darbepoetin (ARANESP) injection - DIALYSIS  150 mcg Intravenous Q Wed-HD  . feeding supplement (RESOURCE BREEZE)  1 Container Oral BID BM  . ferric gluconate (FERRLECIT/NULECIT) IV  62.5 mg Intravenous Q Wed-HD  . heparin  5,000 Units Subcutaneous 3 times per day  .  heparin  6,000 Units Dialysis Once in dialysis  . isosorbide-hydrALAZINE  1 tablet Oral BID  . PARoxetine  20 mg Oral Daily  . piperacillin-tazobactam (ZOSYN)  IV  2.25 g Intravenous 3 times per day  . sodium chloride  3 mL Intravenous Q12H  . [START ON 02/04/2014] vancomycin  1,000 mg Intravenous Q M,W,F-HD

## 2014-02-01 NOTE — Progress Notes (Signed)
[facility] PROGRESS NOTE  Sean [name] NUU:[id number] DOB: 1936/05/04 DOA: 01/27/2014 PCP: Philis Fendt, MD  Assessment/Plan: 1. Left leg calciphylalactic unstageable open wound between the knee and the ankle showing bone and muscles was absent to ounces of the dorsalis pedis and the posterior tibialis.   -Continue pain management -agree that Given his comorbidities -he will definitely be high risk for surgery however without it he will probably die  -AKA now planned for Monday 8/24 per Dr Oneida Alar- Pt/family agreeable -No further fevers so far today 8/23, blood cultures to date with no growth>> follow -Continue IV antibiotics vancomycin and Zosyn  2. History of CAD with multiple MIs in the past - last nuclear test done on 7/5 showed no ischemia but an ejection fraction of 25%. -His chest pain-free, continue outpatient medications  3. End-stage on hemodialysis followed by. center [place] surgery and is scheduled for Monday Wednesday and Friday  -Dialysis per renal -Renal adjusted patient dialysis schedule accordingly to accommodate his planned surgery in a.m. 8/24 -Continue atarax prn for pruritis 4, advanced dementia -On no meds, follow  Code Status: full Family Communication: daughter at bedside  Disposition Plan: Keep in step down for today   Consultants:  Vascular  Procedures:  None  Antibiotics:  Vancomycin and Zosyn started on 818  HPI/Subjective: patient denies any complaints. Daughter at bedside and states mittens working well in keeping patient from scratching himself. Objective: Filed Vitals:   02/01/14 0940  BP: 117/55  Pulse: 88  Temp: 98 F (36.7 C)  Resp: 18    Intake/Output Summary (Last 24 hours) at 02/01/14 1636 Last data filed at 02/01/14 0900  Gross per 24 hour  Intake    100 ml  Output      0 ml  Net    100 ml   Filed Weights   01/30/14 1824 01/31/14 0731 01/31/14 1114  Weight: 81.8 kg (180 lb 5.4 oz) 82.1 kg (181 lb) 81.6 kg  (179 lb 14.3 oz)    Exam:  General: alert  In NAD, answers simple questions appropriately Cardiovascular: RRR, nl S1 s2 Respiratory: Decreased breath sounds at bases, no crackles or wheezes Abdomen: soft +BS NT/ND, no masses palpable Extremities: Left lower extremity with dressing clean and dry, right lower extremity with no cyanosis and no edema    Data Reviewed: Basic Metabolic Panel:  Recent Labs Lab 01/27/14 2100 01/28/14 0318 01/30/14 0720 02/01/14 0722  NA 135* 134* 133* 137  K 4.3 4.6 4.0 4.1  CL 94* 95* 93* 96  CO2 29 24 24 29   GLUCOSE 117* 108* 91 153*  BUN 13 14 16 8   CREATININE 4.80* 5.16* 4.77* 2.72*  CALCIUM 8.7 8.7 8.4 8.4   Liver Function Tests:  Recent Labs Lab 01/27/14 2100  AST 28  ALT 9  ALKPHOS 85  BILITOT 0.6  PROT 7.3  ALBUMIN 1.9*   No results found for this basename: LIPASE, AMYLASE,  in the last 168 hours No results found for this basename: AMMONIA,  in the last 168 hours CBC:  Recent Labs Lab 01/27/14 2100 01/28/14 0318 01/30/14 0720  WBC 10.9* 10.5 9.5  NEUTROABS 8.1*  --   --   HGB 8.5* 8.8* 8.1*  HCT 27.4* 28.5* 25.7*  MCV 86.2 85.6 86.2  PLT 295 316 291   Cardiac Enzymes: No results found for this basename: CKTOTAL, CKMB, CKMBINDEX, TROPONINI,  in the last 168 hours BNP (last 3 results)  Recent Labs  12/13/13 1408 01/17/14 0800  PROBNP >  70000.0* >70000.0*   CBG:  Recent Labs Lab 01/30/14 2116 01/31/14 1158 01/31/14 1616 02/01/14 0759 02/01/14 1215  GLUCAP 97 81 101* 152* 133*    No results found for this or any previous visit (from the past 240 hour(s)).   Studies: No results found.  Scheduled Meds: . atorvastatin  20 mg Oral QHS  . carvedilol  3.125 mg Oral BID WC  . darbepoetin (ARANESP) injection - DIALYSIS  150 mcg Intravenous Q Wed-HD  . feeding supplement (RESOURCE BREEZE)  1 Container Oral BID BM  . ferric gluconate (FERRLECIT/NULECIT) IV  62.5 mg Intravenous Q Wed-HD  . heparin  5,000  Units Subcutaneous 3 times per day  . heparin  6,000 Units Dialysis Once in dialysis  . isosorbide-hydrALAZINE  1 tablet Oral BID  . PARoxetine  20 mg Oral Daily  . piperacillin-tazobactam (ZOSYN)  IV  2.25 g Intravenous 3 times per day  . sodium chloride  3 mL Intravenous Q12H  . [START ON 02/04/2014] vancomycin  1,000 mg Intravenous Q M,W,F-HD   Continuous Infusions:   Principal Problem:   Open leg wound    Time spent: [facility] Pager [phone]. If 7PM-7AM, please contact night-coverage at [email or web address], password [facility] 02/01/2014, 4:36 PM  LOS: 5 days

## 2014-02-01 NOTE — Progress Notes (Signed)
I saw the patient and agree with the above assessment and plan.    Daughter updated.  Still spiking fevers on ABX.  For AKA in AM.  Next HD Tuesday.  Hopeful he will  Qualify for CIR.

## 2014-02-02 ENCOUNTER — Encounter (HOSPITAL_COMMUNITY): Payer: Self-pay | Admitting: Anesthesiology

## 2014-02-02 ENCOUNTER — Encounter (HOSPITAL_COMMUNITY)
Admission: EM | Disposition: A | Discharge status: Skilled Nursing Facility | Payer: Self-pay | Source: Home / Self Care | Attending: Internal Medicine

## 2014-02-02 ENCOUNTER — Encounter (HOSPITAL_COMMUNITY): Payer: Medicare Other | Admitting: Anesthesiology

## 2014-02-02 ENCOUNTER — Inpatient Hospital Stay (HOSPITAL_COMMUNITY): Payer: Medicare Other | Admitting: Anesthesiology

## 2014-02-02 HISTORY — PX: AMPUTATION: SHX166

## 2014-02-02 LAB — CBC
HCT: 26.3 % — ABNORMAL LOW (ref 39.0–52.0)
HEMOGLOBIN: 8 g/dL — AB (ref 13.0–17.0)
MCH: 25.8 pg — AB (ref 26.0–34.0)
MCHC: 30.4 g/dL (ref 30.0–36.0)
MCV: 84.8 fL (ref 78.0–100.0)
Platelets: 400 10*3/uL (ref 150–400)
RBC: 3.1 MIL/uL — ABNORMAL LOW (ref 4.22–5.81)
RDW: 18.5 % — ABNORMAL HIGH (ref 11.5–15.5)
WBC: 18.7 10*3/uL — ABNORMAL HIGH (ref 4.0–10.5)

## 2014-02-02 LAB — GLUCOSE, CAPILLARY
GLUCOSE-CAPILLARY: 109 mg/dL — AB (ref 70–99)
GLUCOSE-CAPILLARY: 144 mg/dL — AB (ref 70–99)
Glucose-Capillary: 106 mg/dL — ABNORMAL HIGH (ref 70–99)
Glucose-Capillary: 122 mg/dL — ABNORMAL HIGH (ref 70–99)
Glucose-Capillary: 105 mg/dL — ABNORMAL HIGH (ref 70–99)

## 2014-02-02 LAB — SURGICAL PCR SCREEN
MRSA, PCR: NEGATIVE
STAPHYLOCOCCUS AUREUS: POSITIVE — AB

## 2014-02-02 SURGERY — AMPUTATION, ABOVE KNEE
Anesthesia: General | Site: Leg Upper | Laterality: Left

## 2014-02-02 MED ORDER — PROPOFOL 10 MG/ML IV BOLUS
INTRAVENOUS | Status: DC | PRN
  Administered 2014-02-02: 50 mg via INTRAVENOUS
  Administered 2014-02-02: 120 mg via INTRAVENOUS

## 2014-02-02 MED ORDER — PANTOPRAZOLE SODIUM 40 MG PO TBEC
40.0000 mg | DELAYED_RELEASE_TABLET | Freq: Every day | ORAL | Status: DC
  Administered 2014-02-02: 40 mg via ORAL
  Filled 2014-02-02: qty 1

## 2014-02-02 MED ORDER — PHENYLEPHRINE 40 MCG/ML (10ML) SYRINGE FOR IV PUSH (FOR BLOOD PRESSURE SUPPORT)
PREFILLED_SYRINGE | INTRAVENOUS | Status: AC
  Filled 2014-02-02: qty 10

## 2014-02-02 MED ORDER — ONDANSETRON HCL 4 MG/2ML IJ SOLN
INTRAMUSCULAR | Status: DC | PRN
  Administered 2014-02-02: 4 mg via INTRAVENOUS

## 2014-02-02 MED ORDER — HYDRALAZINE HCL 20 MG/ML IJ SOLN: 10.0000 mg | INTRAMUSCULAR | Status: DC | PRN

## 2014-02-02 MED ORDER — 0.9 % SODIUM CHLORIDE (POUR BTL) OPTIME
TOPICAL | Status: DC | PRN
  Administered 2014-02-02: 1000 mL

## 2014-02-02 MED ORDER — EPHEDRINE SULFATE 50 MG/ML IJ SOLN
INTRAMUSCULAR | Status: DC | PRN
  Administered 2014-02-02 (×4): 10 mg via INTRAVENOUS

## 2014-02-02 MED ORDER — CEFAZOLIN SODIUM-DEXTROSE 2-3 GM-% IV SOLR
INTRAVENOUS | Status: DC | PRN
  Administered 2014-02-02: 2 g via INTRAVENOUS

## 2014-02-02 MED ORDER — LIDOCAINE HCL (CARDIAC) 20 MG/ML IV SOLN
INTRAVENOUS | Status: AC
  Filled 2014-02-02: qty 5

## 2014-02-02 MED ORDER — PHENYLEPHRINE HCL 10 MG/ML IJ SOLN
INTRAMUSCULAR | Status: DC | PRN
  Administered 2014-02-02 (×2): 80 ug via INTRAVENOUS
  Administered 2014-02-02: 120 ug via INTRAVENOUS
  Administered 2014-02-02 (×2): 80 ug via INTRAVENOUS

## 2014-02-02 MED ORDER — CEFAZOLIN SODIUM-DEXTROSE 2-3 GM-% IV SOLR
INTRAVENOUS | Status: AC
  Filled 2014-02-02: qty 50

## 2014-02-02 MED ORDER — MAGNESIUM SULFATE 40 MG/ML IJ SOLN
2.0000 g | Freq: Every day | INTRAMUSCULAR | Status: DC | PRN
  Filled 2014-02-02: qty 50

## 2014-02-02 MED ORDER — OXYCODONE HCL 5 MG/5ML PO SOLN: 5.0000 mg | Freq: Once | ORAL | Status: DC | PRN

## 2014-02-02 MED ORDER — ONDANSETRON HCL 4 MG/2ML IJ SOLN: 4.0000 mg | Freq: Four times a day (QID) | INTRAMUSCULAR | Status: DC | PRN

## 2014-02-02 MED ORDER — FENTANYL CITRATE 0.05 MG/ML IJ SOLN
INTRAMUSCULAR | Status: AC
  Filled 2014-02-02: qty 5

## 2014-02-02 MED ORDER — DOCUSATE SODIUM 100 MG PO CAPS
100.0000 mg | ORAL_CAPSULE | Freq: Every day | ORAL | Status: DC
  Filled 2014-02-02: qty 1

## 2014-02-02 MED ORDER — FENTANYL CITRATE 0.05 MG/ML IJ SOLN
INTRAMUSCULAR | Status: DC | PRN
  Administered 2014-02-02: 50 ug via INTRAVENOUS

## 2014-02-02 MED ORDER — LACTATED RINGERS IV SOLN
INTRAVENOUS | Status: DC | PRN
  Administered 2014-02-02: 07:00:00 via INTRAVENOUS

## 2014-02-02 MED ORDER — ALUM & MAG HYDROXIDE-SIMETH 200-200-20 MG/5ML PO SUSP
15.0000 mL | ORAL | Status: DC | PRN
  Filled 2014-02-02: qty 30

## 2014-02-02 MED ORDER — METOPROLOL TARTRATE 1 MG/ML IV SOLN: 2.0000 mg | INTRAVENOUS | Status: DC | PRN

## 2014-02-02 MED ORDER — SODIUM CHLORIDE 0.9 % IV BOLUS (SEPSIS)
500.0000 mL | Freq: Once | INTRAVENOUS | Status: AC
  Administered 2014-02-02: 500 mL via INTRAVENOUS

## 2014-02-02 MED ORDER — PHENYLEPHRINE HCL 10 MG/ML IJ SOLN
10.0000 mg | INTRAMUSCULAR | Status: DC | PRN
  Administered 2014-02-02: 50 ug via INTRAVENOUS

## 2014-02-02 MED ORDER — CHLORHEXIDINE GLUCONATE CLOTH 2 % EX PADS: 6.0000 | MEDICATED_PAD | Freq: Every day | CUTANEOUS | Status: DC

## 2014-02-02 MED ORDER — VANCOMYCIN HCL IN DEXTROSE 1-5 GM/200ML-% IV SOLN
1000.0000 mg | Freq: Once | INTRAVENOUS | Status: DC
  Filled 2014-02-02 (×2): qty 200

## 2014-02-02 MED ORDER — ONDANSETRON HCL 4 MG/2ML IJ SOLN
INTRAMUSCULAR | Status: AC
  Filled 2014-02-02: qty 2

## 2014-02-02 MED ORDER — PHENOL 1.4 % MT LIQD
1.0000 | OROMUCOSAL | Status: DC | PRN
  Filled 2014-02-02: qty 177

## 2014-02-02 MED ORDER — LABETALOL HCL 5 MG/ML IV SOLN
10.0000 mg | INTRAVENOUS | Status: DC | PRN
  Filled 2014-02-02: qty 4

## 2014-02-02 MED ORDER — GUAIFENESIN-DM 100-10 MG/5ML PO SYRP
15.0000 mL | ORAL_SOLUTION | ORAL | Status: DC | PRN
  Filled 2014-02-02: qty 15

## 2014-02-02 MED ORDER — OXYCODONE HCL 5 MG PO TABS: 5.0000 mg | ORAL_TABLET | Freq: Once | ORAL | Status: DC | PRN

## 2014-02-02 MED ORDER — MUPIROCIN 2 % EX OINT
1.0000 "application " | TOPICAL_OINTMENT | Freq: Two times a day (BID) | CUTANEOUS | Status: DC
  Administered 2014-02-02 (×2): 1 via NASAL
  Filled 2014-02-02 (×3): qty 22

## 2014-02-02 MED ORDER — HYDROMORPHONE HCL PF 1 MG/ML IJ SOLN: 0.2500 mg | INTRAMUSCULAR | Status: DC | PRN

## 2014-02-02 MED ORDER — PROPOFOL 10 MG/ML IV BOLUS
INTRAVENOUS | Status: AC
  Filled 2014-02-02: qty 20

## 2014-02-02 MED ORDER — POTASSIUM CHLORIDE CRYS ER 20 MEQ PO TBCR: 20.0000 meq | EXTENDED_RELEASE_TABLET | Freq: Every day | ORAL | Status: DC | PRN

## 2014-02-02 SURGICAL SUPPLY — 47 items
BANDAGE ELASTIC 6 VELCRO ST LF (GAUZE/BANDAGES/DRESSINGS) ×2 IMPLANT
BLADE 10 SAFETY STRL DISP (BLADE) ×1 IMPLANT
BLADE SAW RECIP 87.9 MT (BLADE) ×2 IMPLANT
BNDG COHESIVE 4X5 TAN STRL (GAUZE/BANDAGES/DRESSINGS) ×2 IMPLANT
BNDG COHESIVE 6X5 TAN STRL LF (GAUZE/BANDAGES/DRESSINGS) ×1 IMPLANT
BNDG GAUZE ELAST 4 BULKY (GAUZE/BANDAGES/DRESSINGS) ×2 IMPLANT
CANISTER SUCTION 2500CC (MISCELLANEOUS) ×2 IMPLANT
CLIP TI MEDIUM 6 (CLIP) IMPLANT
COVER SURGICAL LIGHT HANDLE (MISCELLANEOUS) ×2 IMPLANT
COVER TABLE BACK 60X90 (DRAPES) ×2 IMPLANT
DRAIN CHANNEL 19F RND (DRAIN) IMPLANT
DRAPE ORTHO SPLIT 77X108 STRL (DRAPES) ×4
DRAPE PROXIMA HALF (DRAPES) ×2 IMPLANT
DRAPE SURG ORHT 6 SPLT 77X108 (DRAPES) ×2 IMPLANT
DRSG ADAPTIC 3X8 NADH LF (GAUZE/BANDAGES/DRESSINGS) ×2 IMPLANT
ELECT REM PT RETURN 9FT ADLT (ELECTROSURGICAL) ×2
ELECTRODE REM PT RTRN 9FT ADLT (ELECTROSURGICAL) ×1 IMPLANT
EVACUATOR SILICONE 100CC (DRAIN) IMPLANT
GAUZE SPONGE 4X4 12PLY STRL (GAUZE/BANDAGES/DRESSINGS) ×2 IMPLANT
GLOVE BIO SURGEON STRL SZ 6.5 (GLOVE) ×1 IMPLANT
GLOVE BIO SURGEON STRL SZ7.5 (GLOVE) ×2 IMPLANT
GLOVE BIOGEL PI IND STRL 6.5 (GLOVE) IMPLANT
GLOVE BIOGEL PI IND STRL 7.0 (GLOVE) IMPLANT
GLOVE BIOGEL PI INDICATOR 6.5 (GLOVE) ×1
GLOVE BIOGEL PI INDICATOR 7.0 (GLOVE) ×3
GLOVE SURG SS PI 6.0 STRL IVOR (GLOVE) ×1 IMPLANT
GOWN STRL REUS W/ TWL LRG LVL3 (GOWN DISPOSABLE) ×3 IMPLANT
GOWN STRL REUS W/TWL LRG LVL3 (GOWN DISPOSABLE) ×8
KIT BASIN OR (CUSTOM PROCEDURE TRAY) ×2 IMPLANT
KIT ROOM TURNOVER OR (KITS) ×2 IMPLANT
NS IRRIG 1000ML POUR BTL (IV SOLUTION) ×2 IMPLANT
PACK GENERAL/GYN (CUSTOM PROCEDURE TRAY) ×2 IMPLANT
PAD ARMBOARD 7.5X6 YLW CONV (MISCELLANEOUS) ×3 IMPLANT
SPONGE GAUZE 4X4 12PLY STER LF (GAUZE/BANDAGES/DRESSINGS) ×1 IMPLANT
STAPLER VISISTAT 35W (STAPLE) ×3 IMPLANT
STOCKINETTE IMPERVIOUS LG (DRAPES) ×2 IMPLANT
SUT ETHILON 3 0 PS 1 (SUTURE) IMPLANT
SUT SILK 2 0 SH (SUTURE) ×2 IMPLANT
SUT SILK 2 0 TIES 10X30 (SUTURE) ×2 IMPLANT
SUT VIC AB 2-0 CT1 18 (SUTURE) ×4 IMPLANT
SUT VIC AB 2-0 SH 18 (SUTURE) ×5 IMPLANT
SUT VIC AB 3-0 SH 27 (SUTURE) ×4
SUT VIC AB 3-0 SH 27X BRD (SUTURE) ×2 IMPLANT
TOWEL OR 17X24 6PK STRL BLUE (TOWEL DISPOSABLE) ×2 IMPLANT
TOWEL OR 17X26 10 PK STRL BLUE (TOWEL DISPOSABLE) ×2 IMPLANT
UNDERPAD 30X30 INCONTINENT (UNDERPADS AND DIAPERS) ×2 IMPLANT
WATER STERILE IRR 1000ML POUR (IV SOLUTION) ×2 IMPLANT

## 2014-02-02 NOTE — Anesthesia Postprocedure Evaluation (Signed)
Anesthesia Post Note  Patient: Sean [name]  Procedure(s) Performed: Procedure(s) (LRB): AMPUTATION ABOVE KNEE-LEFT (Left)  Anesthesia type: General  Patient location: PACU  Post pain: Pain level controlled and Adequate analgesia  Post assessment: Post-op Vital signs reviewed, Patient's Cardiovascular Status Stable, Respiratory Function Stable, Patent Airway and Pain level controlled  Last Vitals:  Filed Vitals:   02/02/14 0935  BP: 78/40  Pulse: 94  Temp:   Resp: 17    Post vital signs: Reviewed and stable  Level of consciousness: awake, alert  and oriented  Complications: No apparent anesthesia complications

## 2014-02-02 NOTE — Interval H&P Note (Signed)
History and Physical Interval Note:  02/02/2014 7:23 AM  Sean [name]  has presented today for surgery, with the diagnosis of Extensive Necrotic Wound Left Leg Peripheral Arterial Disease  The various methods of treatment have been discussed with the patient and family. After consideration of risks, benefits and other options for treatment, the patient has consented to  Procedure(s): AMPUTATION ABOVE KNEE-LEFT (Left) as a surgical intervention .  The patient's history has been reviewed, patient examined, no change in status, stable for surgery.  I have reviewed the patient's chart and labs.  Questions were answered to the patient's satisfaction.     Karine Garn E

## 2014-02-02 NOTE — Plan of Care (Signed)
Problem: Phase I Progression Outcomes Goal: Pain controlled with appropriate interventions Outcome: Progressing Patient is unable to answer if he is having pain. He appears comfortable in the bed and falls asleep after care is provided. Explained to patient and family that I am not comfortable giving pain medication at this time due to low blood pressure and because he is lethargic.

## 2014-02-02 NOTE — Progress Notes (Addendum)
[facility] PROGRESS NOTE  [name] PXT:[id number] DOB: 01-Jul-1935 DOA: 01/27/2014 PCP: Philis Fendt, MD  Assessment/Plan: 1. Left leg calciphylalactic unstageable open wound between the knee and the ankle showing bone and muscles was absent to ounces of the dorsalis pedis and the posterior tibialis.   -Continue pain management -agree that Given his comorbidities -he will definitely be high risk for surgery however without it he will probably die  -Status post AKA per vascular -No further fevers so far today 8/23, blood cultures to date with no growth>> follow -Continue IV antibiotics vancomycin and Zosyn  2. History of CAD with multiple MIs in the past - last nuclear test done on 7/5 showed no ischemia but an ejection fraction of 25%. -His chest pain-free, continue outpatient medications  3. End-stage on hemodialysis followed by. center [place] surgery and is scheduled for Monday Wednesday and Friday  -Dialysis per renal -Renal adjusted patient dialysis schedule accordingly to accommodate his surgery  -Continue atarax prn for pruritis 4, advanced dementia -On no meds, follow  Code Status: full Family Communication: niece at bedside  Disposition Plan: Pending clinical course   Consultants:  Vascular  Procedures:  None  Antibiotics:  Vancomycin and Zosyn started on 818  HPI/Subjective: s/p AKA, alert and answers simple questions appropriately. Denies pain.  Objective: Filed Vitals:   02/02/14 1135  BP: 89/49  Pulse: 92  Temp: 97.6 F (36.4 C)  Resp: 22    Intake/Output Summary (Last 24 hours) at 02/02/14 1537 Last data filed at 02/02/14 0856  Gross per 24 hour  Intake    540 ml  Output      0 ml  Net    540 ml   Filed Weights   01/31/14 0731 01/31/14 1114 02/01/14 2144  Weight: 82.1 kg (181 lb) 81.6 kg (179 lb 14.3 oz) 81.8 kg (180 lb 5.4 oz)    Exam:  General: alert  In NAD, answers simple questions appropriately Cardiovascular: RRR, nl  S1 s2 Respiratory: Decreased breath sounds at bases, no crackles or wheezes Abdomen: soft +BS NT/ND, no masses palpable Extremities: Left AKA with dressing clean and dry, right lower extremity with no cyanosis and no edema    Data Reviewed: Basic Metabolic Panel:  Recent Labs Lab 01/27/14 2100 01/28/14 0318 01/30/14 0720 02/01/14 0722  NA 135* 134* 133* 137  K 4.3 4.6 4.0 4.1  CL 94* 95* 93* 96  CO2 29 24 24 29   GLUCOSE 117* 108* 91 153*  BUN 13 14 16 8   CREATININE 4.80* 5.16* 4.77* 2.72*  CALCIUM 8.7 8.7 8.4 8.4   Liver Function Tests:  Recent Labs Lab 01/27/14 2100  AST 28  ALT 9  ALKPHOS 85  BILITOT 0.6  PROT 7.3  ALBUMIN 1.9*   No results found for this basename: LIPASE, AMYLASE,  in the last 168 hours No results found for this basename: AMMONIA,  in the last 168 hours CBC:  Recent Labs Lab 01/27/14 2100 01/28/14 0318 01/30/14 0720  WBC 10.9* 10.5 9.5  NEUTROABS 8.1*  --   --   HGB 8.5* 8.8* 8.1*  HCT 27.4* 28.5* 25.7*  MCV 86.2 85.6 86.2  PLT 295 316 291   Cardiac Enzymes: No results found for this basename: CKTOTAL, CKMB, CKMBINDEX, TROPONINI,  in the last 168 hours BNP (last 3 results)  Recent Labs  12/13/13 1408 01/17/14 0800  PROBNP >70000.0* >70000.0*   CBG:  Recent Labs Lab 02/01/14 1215 02/01/14 1636 02/01/14 2224 02/02/14 0528 02/02/14 1133  GLUCAP 133* 116* 141* 122* 109*    Recent Results (from the past 240 hour(s))  CULTURE, BLOOD (ROUTINE X 2)     Status: None   Collection Time    01/31/14  4:28 PM      Result Value Ref Range Status   Specimen Description BLOOD RIGHT HAND   Final   Special Requests BOTTLES DRAWN AEROBIC AND ANAEROBIC 10 CC   Final   Culture  Setup Time     Final   Value: 02/01/2014 00:09     Performed at [facility]   Culture     Final   Value:        BLOOD CULTURE RECEIVED NO GROWTH TO DATE CULTURE WILL BE HELD FOR 5 DAYS BEFORE ISSUING A FINAL NEGATIVE REPORT     Performed at [facility]   Report Status PENDING   Incomplete  CULTURE, BLOOD (ROUTINE X 2)     Status: None   Collection Time    02/01/14  7:22 AM      Result Value Ref Range Status   Specimen Description BLOOD RIGHT HAND   Final   Special Requests BOTTLES DRAWN AEROBIC AND ANAEROBIC 5CC EACH   Final   Culture  Setup Time     Final   Value: 02/01/2014 17:45     Performed at [facility]   Culture     Final   Value:        BLOOD CULTURE RECEIVED NO GROWTH TO DATE CULTURE WILL BE HELD FOR 5 DAYS BEFORE ISSUING A FINAL NEGATIVE REPORT     Performed at [facility]   Report Status PENDING   Incomplete  SURGICAL PCR SCREEN     Status: Abnormal   Collection Time    02/02/14  5:04 AM      Result Value Ref Range Status   MRSA, PCR NEGATIVE  NEGATIVE Final   Staphylococcus aureus POSITIVE (*) NEGATIVE Final   Comment:            The Xpert SA Assay (FDA     approved for NASAL specimens     in patients over 63 years of age),     is one component of     a comprehensive surveillance     program.  Test performance has     been validated by [employer] for patients greater     than or equal to 4 year old.     It is not intended     to diagnose infection nor to     guide or monitor treatment.     Studies: No results found.  Scheduled Meds: . atorvastatin  20 mg Oral QHS  . carvedilol  3.125 mg Oral BID WC  . Chlorhexidine Gluconate Cloth  6 each Topical Daily  . darbepoetin (ARANESP) injection - DIALYSIS  150 mcg Intravenous Q Wed-HD  . [START ON 02/03/2014] docusate sodium  100 mg Oral Daily  . feeding supplement (RESOURCE BREEZE)  1 Container Oral BID BM  . ferric gluconate (FERRLECIT/NULECIT) IV  62.5 mg Intravenous Q Wed-HD  . heparin  5,000 Units Subcutaneous 3 times per day  . isosorbide-hydrALAZINE  1 tablet Oral BID  . mupirocin ointment  1 application Nasal BID  . pantoprazole  40 mg Oral Daily  . PARoxetine  20 mg Oral Daily  . piperacillin-tazobactam (ZOSYN)   IV  2.25 g Intravenous 3 times per day  . sodium  chloride  3 mL Intravenous Q12H  . [START ON 02/04/2014] vancomycin  1,000 mg Intravenous Q M,W,F-HD  . [START ON 02/03/2014] vancomycin  1,000 mg Intravenous Once   Continuous Infusions:   Principal Problem:   Open leg wound    Time spent: [facility] Pager [phone]. If 7PM-7AM, please contact night-coverage at [email or web address], password [facility] 02/02/2014, 3:37 PM  LOS: 6 days

## 2014-02-02 NOTE — Progress Notes (Signed)
Physical medicine and rehabilitation consult requested after left above-knee amputation 02/02/2014. Patient currently resides at [facility] skilled nursing facility. Family is awaiting physical occupational therapy evaluations to be completed to decide on appropriate discharge plan. Will followup pending disposition the family and recommendations by therapy team

## 2014-02-02 NOTE — Anesthesia Preprocedure Evaluation (Addendum)
Anesthesia Evaluation  Patient identified by MRN, date of birth, ID band Patient awake    Reviewed: Allergy & Precautions, H&P , NPO status , Patient's Chart, lab work & pertinent test results  Airway Mallampati: II  Neck ROM: full    Dental   Pulmonary former smoker,          Cardiovascular hypertension, + CAD, + Past MI and + Peripheral Vascular Disease  Cleared by his internist.   Last nuclear test done on 7/5 showed no ischemia but an ejection fraction of 25%. -His chest pain-free   Neuro/Psych  Headaches, PSYCHIATRIC DISORDERS Anxiety Depression  Neuromuscular disease CVA    GI/Hepatic GERD-  ,  Endo/Other  diabetes, Type 2  Renal/GU ESRF and DialysisRenal disease     Musculoskeletal   Abdominal   Peds  Hematology   Anesthesia Other Findings   Reproductive/Obstetrics                          Anesthesia Physical Anesthesia Plan  ASA: III  Anesthesia Plan: General   Post-op Pain Management:    Induction: Intravenous  Airway Management Planned: LMA  Additional Equipment:   Intra-op Plan:   Post-operative Plan:   Informed Consent: I have reviewed the patients History and Physical, chart, labs and discussed the procedure including the risks, benefits and alternatives for the proposed anesthesia with the patient or authorized representative who has indicated his/her understanding and acceptance.     Plan Discussed with: CRNA, Anesthesiologist and Surgeon  Anesthesia Plan Comments:         Anesthesia Quick Evaluation

## 2014-02-02 NOTE — Op Note (Addendum)
[facility] OPERATIVE NOTE Procedure: Left above knee amputation  Surgeon(s): Elam Dutch, MD  ASSISTANT: Silva Bandy, PA-C  Anesthesia: General  Specimens: Left leg  PROCEDURE DETAIL: After obtaining informed consent, the patient was taken to the operating room. The patient was placed in supine position the operating room table. After induction of general anesthesia and endotracheal intubation the patient's Foley catheter was placed. Next patient's entire left lower extremity was prepped and draped in usual sterile fashion. A circumferential incision was made on the left leg just above the knee. The incision was carried down into the sucutaneous tissues down to level the saphenous vein. This was ligated and divided between silk ties. Soft tissues were taken down as well as the muscle and fascia with cautery. The superficial femoral artery and vein were dissected free circumferentially clamped and divided. These were suture ligated proximally. These were heavily calcified. Remainder of the soft tissues were taken down with cautery. Several smaller arterial branches were also ligated with silk ties due to severe calcification preventing in arterial spasm.  The periosteum was raised on the femur approximately 5 cm above the skin edge. The femur was divided at this level. The leg was passed off the table as a specimen. Hemostasis was obtained. The wound was thoroughly irrigated with normal saline solution. The fascial edges were reapproximated using interrupted 2 0 Vicryl sutures. The subcutaneous tissues reapproximated using a running 3-0 Vicryl suture. The skin was closed with staples. Patient tolerated procedure well and there were no complications. Instrument sponge and needle counts were correct at the end of the case. Patient was taken to recovery in stable condition.  Ruta Hinds, MD [facility] [place] Office: [phone] Pager: [phone]

## 2014-02-02 NOTE — Progress Notes (Signed)
  [facility] Progress Note  02/02/2014 3:20 PM Day of surgery  Subjective:  Having pain with left stump.    Filed Vitals:   02/02/14 1135  BP: 89/49  Pulse: 92  Temp: 97.6 F (36.4 C)  Resp: 22    Physical Exam: Left AKA stump is dressed with no drainage seen on dressing.   CBC    Component Value Date/Time   WBC 9.5 01/30/2014 0720   RBC 2.98* 01/30/2014 0720   RBC 3.67* 09/19/2011 1356   HGB 8.1* 01/30/2014 0720   HCT 25.7* 01/30/2014 0720   PLT 291 01/30/2014 0720   MCV 86.2 01/30/2014 0720   MCH 27.2 01/30/2014 0720   MCHC 31.5 01/30/2014 0720   RDW 18.2* 01/30/2014 0720   LYMPHSABS 0.9 01/27/2014 2100   MONOABS 1.8* 01/27/2014 2100   EOSABS 0.1 01/27/2014 2100   BASOSABS 0.0 01/27/2014 2100    BMET    Component Value Date/Time   NA 137 02/01/2014 0722   K 4.1 02/01/2014 0722   CL 96 02/01/2014 0722   CO2 29 02/01/2014 0722   GLUCOSE 153* 02/01/2014 0722   BUN 8 02/01/2014 0722   CREATININE 2.72* 02/01/2014 0722   CALCIUM 8.4 02/01/2014 0722   GFRNONAA 21* 02/01/2014 0722   GFRAA 25* 02/01/2014 0722    INR    Component Value Date/Time   INR 1.37 01/28/2014 0318     Intake/Output Summary (Last 24 hours) at 02/02/14 1520 Last data filed at 02/02/14 0856  Gross per 24 hour  Intake    540 ml  Output      0 ml  Net    540 ml     Assessment/Plan:  78 y.o. male is s/p left above knee amputation  POD 0  -Having appropriate post-operative pain.  -Will take down dressing on POD 2.    Virgina Jock, PA-C [facility] Office: [phone] Pager: [phone] 02/02/2014 3:20 PM

## 2014-02-02 NOTE — Progress Notes (Signed)
ANTIBIOTIC CONSULT NOTE - FOLLOW UP  Pharmacy Consult for Vancomycin and Zosyn Indication: sepsis, wound infection  Allergies  Allergen Reactions  . Benazepril Hcl Hives, Itching and Rash    flareups on patient head    Patient Measurements: Height: 5\' 5"  (165.1 cm) Weight: 180 lb 5.4 oz (81.8 kg) IBW/kg (Calculated) : 61.5 Adjusted Body Weight:   Vital Signs: Temp: 97.6 F (36.4 C) (08/24 1135) Temp src: Oral (08/24 1135) BP: 89/49 mmHg (08/24 1135) Pulse Rate: 92 (08/24 1135) Intake/Output from previous day: 08/23 0701 - 08/24 0700 In: 300 [P.O.:300] Out: -  Intake/Output from this shift: Total I/O In: 300 [I.V.:300] Out: -   Labs:  Recent Labs  02/01/14 0722  CREATININE 2.72*   Estimated Creatinine Clearance: 22.7 ml/min (by C-G formula based on Cr of 2.72). No results found for this basename: VANCOTROUGH, VANCOPEAK, VANCORANDOM, GENTTROUGH, GENTPEAK, GENTRANDOM, TOBRATROUGH, TOBRAPEAK, TOBRARND, AMIKACINPEAK, AMIKACINTROU, AMIKACIN,  in the last 72 hours   Microbiology: Recent Results (from the past 720 hour(s))  WOUND CULTURE     Status: None   Collection Time    01/17/14  8:05 AM      Result Value Ref Range Status   Specimen Description LEG LEFT   Final   Special Requests NONE   Final   Gram Stain     Final   Value: RARE SQUAMOUS EPITHELIAL CELLS PRESENT     MODERATE WBC PRESENT, PREDOMINANTLY PMN     FEW GRAM POSITIVE COCCI IN CLUSTERS     MODERATE GRAM NEGATIVE RODS     RARE GRAM POSITIVE RODS   Culture     Final   Value: MODERATE STAPHYLOCOCCUS AUREUS     Note: RIFAMPIN AND GENTAMICIN SHOULD NOT BE USED AS SINGLE DRUGS FOR TREATMENT OF STAPH INFECTIONS.     MODERATE CITROBACTER AMALONATICUS     Performed at [facility]   Report Status 01/21/2014 FINAL   Final   Organism ID, Bacteria STAPHYLOCOCCUS AUREUS   Final   Organism ID, Bacteria CITROBACTER AMALONATICUS   Final  CULTURE, BLOOD (ROUTINE X 2)     Status: None   Collection Time   01/17/14  9:30 AM      Result Value Ref Range Status   Specimen Description BLOOD RIGHT ARM   Final   Special Requests     Final   Value: BOTTLES DRAWN AEROBIC AND ANAEROBIC 10CC BLUE, 5CC RED   Culture  Setup Time     Final   Value: 01/17/2014 20:29     Performed at [facility]   Culture     Final   Value: NO GROWTH 5 DAYS     Performed at [facility]   Report Status 01/23/2014 FINAL   Final  CULTURE, BLOOD (ROUTINE X 2)     Status: None   Collection Time    01/17/14  9:40 AM      Result Value Ref Range Status   Specimen Description BLOOD RIGHT HAND   Final   Special Requests BOTTLES DRAWN AEROBIC ONLY [facility]   Final   Culture  Setup Time     Final   Value: 01/17/2014 20:29     Performed at [facility]   Culture     Final   Value: NO GROWTH 5 DAYS     Performed at [facility]   Report Status 01/23/2014 FINAL   Final  MRSA PCR SCREENING     Status: None   Collection Time  01/17/14  9:33 PM      Result Value Ref Range Status   MRSA by PCR NEGATIVE  NEGATIVE Final   Comment:            The GeneXpert MRSA Assay (FDA     approved for NASAL specimens     only), is one component of a     comprehensive MRSA colonization     surveillance program. It is not     intended to diagnose MRSA     infection nor to guide or     monitor treatment for     MRSA infections.  CULTURE, BLOOD (ROUTINE X 2)     Status: None   Collection Time    01/31/14  4:28 PM      Result Value Ref Range Status   Specimen Description BLOOD RIGHT HAND   Final   Special Requests BOTTLES DRAWN AEROBIC AND ANAEROBIC 10 CC   Final   Culture  Setup Time     Final   Value: 02/01/2014 00:09     Performed at [facility]   Culture     Final   Value:        BLOOD CULTURE RECEIVED NO GROWTH TO DATE CULTURE WILL BE HELD FOR 5 DAYS BEFORE ISSUING A FINAL NEGATIVE REPORT     Performed at [facility]   Report Status PENDING   Incomplete  CULTURE, BLOOD (ROUTINE X 2)      Status: None   Collection Time    02/01/14  7:22 AM      Result Value Ref Range Status   Specimen Description BLOOD RIGHT HAND   Final   Special Requests BOTTLES DRAWN AEROBIC AND ANAEROBIC 5CC EACH   Final   Culture  Setup Time     Final   Value: 02/01/2014 17:45     Performed at [facility]   Culture     Final   Value:        BLOOD CULTURE RECEIVED NO GROWTH TO DATE CULTURE WILL BE HELD FOR 5 DAYS BEFORE ISSUING A FINAL NEGATIVE REPORT     Performed at [facility]   Report Status PENDING   Incomplete  SURGICAL PCR SCREEN     Status: Abnormal   Collection Time    02/02/14  5:04 AM      Result Value Ref Range Status   MRSA, PCR NEGATIVE  NEGATIVE Final   Staphylococcus aureus POSITIVE (*) NEGATIVE Final   Comment:            The Xpert SA Assay (FDA     approved for NASAL specimens     in patients over 33 years of age),     is one component of     a comprehensive surveillance     program.  Test performance has     been validated by [employer] for patients greater     than or equal to 87 year old.     It is not intended     to diagnose infection nor to     guide or monitor treatment.    Anti-infectives   Start     Dose/Rate Route Frequency Ordered Stop   02/04/14 1200  vancomycin (VANCOCIN) IVPB 1000 mg/200 mL premix     1,000 mg 200 mL/hr over 60 Minutes Intravenous Every M-W-F (Hemodialysis) 02/01/14 0819     01/31/14 1200  vancomycin (VANCOCIN) IVPB 1000 mg/200 mL  premix     1,000 mg 200 mL/hr over 60 Minutes Intravenous  Once 01/29/14 1329 01/31/14 1111   01/28/14 1200  vancomycin (VANCOCIN) IVPB 1000 mg/200 mL premix  Status:  Discontinued     1,000 mg 200 mL/hr over 60 Minutes Intravenous Every M-W-F (Hemodialysis) 01/27/14 2005 02/01/14 0819   01/28/14 0430  piperacillin-tazobactam (ZOSYN) IVPB 2.25 g     2.25 g 100 mL/hr over 30 Minutes Intravenous 3 times per day 01/27/14 2005     01/27/14 2015  vancomycin (VANCOCIN) 2,000 mg in sodium  chloride 0.9 % 500 mL IVPB     2,000 mg 250 mL/hr over 120 Minutes Intravenous  Once 01/27/14 2005 01/27/14 2336   01/27/14 2015  piperacillin-tazobactam (ZOSYN) IVPB 3.375 g     3.375 g 100 mL/hr over 30 Minutes Intravenous  Once 01/27/14 2005 01/28/14 0430      Assessment: 78yom on Vancomycin and Zosyn Day 6 for sepsis and wound infection. Patient underwent left AKA today - will follow-up antibiotic duration/continuation now post-op. Patient has ESRD and receives HD normally MWF. Per Renal notes, patient will receive HD session tomorrow (8/25) and then get back on schedule starting 8/26 - will adjust Vancomycin dosing accordingly. - Tmax 101.1, Blood Cx NGTD  Goal of Therapy:  Pre-HD Vancomycin level: 15-25 mcg/ml  Plan:  1. Continue Vancomycin 1g IV qHD - next session on Tuesday 8/25 and then get back on MWF schedule on 8/26 per Renal note 2. Continue Zosyn 2.25g IV q8h 3. Follow-up antibiotic plan s/p AKA  Earleen Newport [phone] 02/02/2014,1:42 PM

## 2014-02-02 NOTE — H&P (View-Only) (Signed)
    Subjective  -   comfortable   Physical Exam:  Left leg wound with dressing intact Currently on HD        Assessment/Plan: Left leg calciphylaxis  I saw the patient yesterday in dialysis.  He is scheduled for left leg amputation by Dr. Oneida Alar on Monday.  I discussed this via telephone with the family.  All their questions were answered.  He will be n.p.o. after midnight  Sean [name], V. WELLS 02/01/2014 9:40 AM --  Filed Vitals:   02/01/14 0416  BP: 100/49  Pulse: 84  Temp: 98 F (36.7 C)  Resp: 18    Intake/Output Summary (Last 24 hours) at 02/01/14 0940 Last data filed at 01/31/14 1906  Gross per 24 hour  Intake    220 ml  Output    400 ml  Net   -180 ml     Laboratory CBC    Component Value Date/Time   WBC 9.5 01/30/2014 0720   HGB 8.1* 01/30/2014 0720   HCT 25.7* 01/30/2014 0720   PLT 291 01/30/2014 0720    BMET    Component Value Date/Time   NA 137 02/01/2014 0722   K 4.1 02/01/2014 0722   CL 96 02/01/2014 0722   CO2 29 02/01/2014 0722   GLUCOSE 153* 02/01/2014 0722   BUN 8 02/01/2014 0722   CREATININE 2.72* 02/01/2014 0722   CALCIUM 8.4 02/01/2014 0722   GFRNONAA 21* 02/01/2014 0722   GFRAA 25* 02/01/2014 0722    COAG Lab Results  Component Value Date   INR 1.37 01/28/2014   INR 1.04 12/16/2013   INR 1.07 06/27/2009   No results found for this basename: PTT    Antibiotics Anti-infectives   Start     Dose/Rate Route Frequency Ordered Stop   02/04/14 1200  vancomycin (VANCOCIN) IVPB 1000 mg/200 mL premix     1,000 mg 200 mL/hr over 60 Minutes Intravenous Every M-W-F (Hemodialysis) 02/01/14 0819     01/31/14 1200  vancomycin (VANCOCIN) IVPB 1000 mg/200 mL premix     1,000 mg 200 mL/hr over 60 Minutes Intravenous  Once 01/29/14 1329 01/31/14 1111   01/28/14 1200  vancomycin (VANCOCIN) IVPB 1000 mg/200 mL premix  Status:  Discontinued     1,000 mg 200 mL/hr over 60 Minutes Intravenous Every M-W-F (Hemodialysis) 01/27/14 2005 02/01/14 0819   01/28/14 0430  piperacillin-tazobactam (ZOSYN) IVPB 2.25 g     2.25 g 100 mL/hr over 30 Minutes Intravenous 3 times per day 01/27/14 2005     01/27/14 2015  vancomycin (VANCOCIN) 2,000 mg in sodium chloride 0.9 % 500 mL IVPB     2,000 mg 250 mL/hr over 120 Minutes Intravenous  Once 01/27/14 2005 01/27/14 2336   01/27/14 2015  piperacillin-tazobactam (ZOSYN) IVPB 3.375 g     3.375 g 100 mL/hr over 30 Minutes Intravenous  Once 01/27/14 2005 01/28/14 0430       V. Leia Alf, M.D. [facility] [place] Office: [phone] Pager:  [phone]

## 2014-02-02 NOTE — Progress Notes (Signed)
Subjective:  Slightly drowsy s/p surgery this morning, pleasant, no complaints  Objective: Vital signs in last 24 hours: Temp:  [97.5 F (36.4 C)-101.1 F (38.4 C)] 97.6 F (36.4 C) (08/24 1135) Pulse Rate:  [87-98] 92 (08/24 1135) Resp:  [16-23] 22 (08/24 1135) BP: (78-101)/(40-54) 89/49 mmHg (08/24 1135) SpO2:  [96 %-100 %] 100 % (08/24 1135) Weight:  [81.8 kg (180 lb 5.4 oz)] 81.8 kg (180 lb 5.4 oz) (08/23 2144) Weight change: -0.3 kg (-10.6 oz)  Intake/Output from previous day: 08/23 0701 - 08/24 0700 In: 300 [P.O.:300] Out: -  Intake/Output this shift: Total I/O In: 300 [I.V.:300] Out: -   Lab Results: No results found for this basename: WBC, HGB, HCT, PLT,  in the last 72 hours BMET:  Recent Labs  02/01/14 0722  NA 137  K 4.1  CL 96  CO2 29  GLUCOSE 153*  BUN 8  CREATININE 2.72*  CALCIUM 8.4   No results found for this basename: PTH,  in the last 72 hours Iron Studies: No results found for this basename: IRON, TIBC, TRANSFERRIN, FERRITIN,  in the last 72 hours  Studies/Results: No results found.  EXAM: General appearance:  Somnolent, in no apparent distress Resp:  CTA without rales, rhonchi, or wheezes Cardio:  RRR without murmur or rub GI:  + BS, soft and nontender Extremities:  No edema, dressing on L AKA Access:  AVG @ LUA with + bruit  Dialysis Orders: Center: [facility] on MWF .  EDW 82kg HD Bath 4.0k, 2.25ca Time 4.o hr Heparin 6,000. Access LUA AVG BFR 450 DFR 1.5  0 Vit D HD Arasnep 111mcg qwed Units IV/HD Venofer 100 mg q weekly hd  OP Lab ipth 647 12/31/13  Assessment/Plan: 1. Necrotic L lower extremity calciphylactic wounds - s/p L AKA per Dr. Oneida Alar today, on Vancomycin & Zosyn, stable. 2. ESRD - HD on MWF @ [place], K 4.1.  HD postponed until tomorrow. 3. HTN/Volume - BP 89/49 on Carvedilol 3.125 mg bid, Bidil 20-37.5 mg bid; below EDW. BP's too low, have dc'd all BP meds (coreg and Bidil) for now.   4. Anemia - Hgb 8.1, Aranesp 150 mcg & Fe on  Wed. 5. Sec HPT - Ca 8.4; no Vitamin D or binders. 6. Nutrition - clear liquid diet, multivitamin. 7. DM - per primary.   LOS: 6 days   LYLES,CHARLES 02/02/2014,1:57 PM  Pt seen, examined, agree w assess/plan as above with additions as indicated.  [name] MD pager [phone]    cell [phone] 02/02/2014, 4:19 PM

## 2014-02-02 NOTE — Progress Notes (Signed)
Notified Dr Baltazar Najjar of hypotension and that patient is lethargic. 500 ml NS bolus ordered. Will monitor

## 2014-02-02 NOTE — Transfer of Care (Signed)
Immediate Anesthesia Transfer of Care Note  Patient: Sean [name]  Procedure(s) Performed: Procedure(s): AMPUTATION ABOVE KNEE-LEFT (Left)  Patient Location: PACU  Anesthesia Type:General  Level of Consciousness: awake  Airway & Oxygen Therapy: Patient Spontanous Breathing  Post-op Assessment: Report given to PACU RN  Post vital signs: Reviewed and stable  Complications: No apparent anesthesia complications

## 2014-02-02 NOTE — Progress Notes (Signed)
Notified Dr Baltazar Najjar of repeat blood pressure after fluid bolus administered. Awaiting CBC results.

## 2014-02-02 NOTE — Progress Notes (Signed)
Report received from PACU, RN.

## 2014-02-03 ENCOUNTER — Encounter (HOSPITAL_COMMUNITY): Payer: Self-pay | Admitting: Physician Assistant

## 2014-02-03 ENCOUNTER — Inpatient Hospital Stay (HOSPITAL_COMMUNITY): Payer: Medicare Other

## 2014-02-03 DIAGNOSIS — R6521 Severe sepsis with septic shock: Secondary | ICD-10-CM

## 2014-02-03 DIAGNOSIS — K810 Acute cholecystitis: Secondary | ICD-10-CM

## 2014-02-03 DIAGNOSIS — E1339 Other specified diabetes mellitus with other diabetic ophthalmic complication: Secondary | ICD-10-CM

## 2014-02-03 DIAGNOSIS — I248 Other forms of acute ischemic heart disease: Secondary | ICD-10-CM

## 2014-02-03 DIAGNOSIS — R6520 Severe sepsis without septic shock: Secondary | ICD-10-CM

## 2014-02-03 DIAGNOSIS — I059 Rheumatic mitral valve disease, unspecified: Secondary | ICD-10-CM

## 2014-02-03 DIAGNOSIS — A419 Sepsis, unspecified organism: Secondary | ICD-10-CM

## 2014-02-03 DIAGNOSIS — I2489 Other forms of acute ischemic heart disease: Secondary | ICD-10-CM

## 2014-02-03 DIAGNOSIS — E11319 Type 2 diabetes mellitus with unspecified diabetic retinopathy without macular edema: Secondary | ICD-10-CM

## 2014-02-03 LAB — CBC
HCT: 19.3 % — ABNORMAL LOW (ref 39.0–52.0)
HEMATOCRIT: 24.4 % — AB (ref 39.0–52.0)
HEMOGLOBIN: 7.4 g/dL — AB (ref 13.0–17.0)
HEMOGLOBIN: 6 g/dL — AB (ref 13.0–17.0)
MCH: 26.4 pg (ref 26.0–34.0)
MCH: 26.7 pg (ref 26.0–34.0)
MCHC: 30.3 g/dL (ref 30.0–36.0)
MCHC: 31.1 g/dL (ref 30.0–36.0)
MCV: 87.1 fL (ref 78.0–100.0)
MCV: 85.8 fL (ref 78.0–100.0)
Platelets: 402 10*3/uL — ABNORMAL HIGH (ref 150–400)
Platelets: 350 10*3/uL (ref 150–400)
RBC: 2.8 MIL/uL — AB (ref 4.22–5.81)
RBC: 2.25 MIL/uL — ABNORMAL LOW (ref 4.22–5.81)
RDW: 18.6 % — AB (ref 11.5–15.5)
RDW: 18.7 % — ABNORMAL HIGH (ref 11.5–15.5)
WBC: 20.9 10*3/uL — AB (ref 4.0–10.5)
WBC: 21.1 10*3/uL — AB (ref 4.0–10.5)

## 2014-02-03 LAB — HEMOGLOBIN AND HEMATOCRIT, BLOOD
HEMATOCRIT: 27.2 % — AB (ref 39.0–52.0)
Hemoglobin: 8.8 g/dL — ABNORMAL LOW (ref 13.0–17.0)

## 2014-02-03 LAB — RENAL FUNCTION PANEL
Albumin: 1.4 g/dL — ABNORMAL LOW (ref 3.5–5.2)
Anion gap: 15 (ref 5–15)
BUN: 22 mg/dL (ref 6–23)
CALCIUM: 8.3 mg/dL — AB (ref 8.4–10.5)
CO2: 25 mEq/L (ref 19–32)
CREATININE: 5.31 mg/dL — AB (ref 0.50–1.35)
Chloride: 97 mEq/L (ref 96–112)
GFR calc non Af Amer: 9 mL/min — ABNORMAL LOW (ref >90)
GFR, EST AFRICAN AMERICAN: 11 mL/min — AB (ref >90)
GLUCOSE: 121 mg/dL — AB (ref 70–99)
PHOSPHORUS: 3.5 mg/dL (ref 2.3–4.6)
Potassium: 4.6 mEq/L (ref 3.7–5.3)
Sodium: 137 mEq/L (ref 137–147)

## 2014-02-03 LAB — CK TOTAL AND CKMB (NOT AT ARMC)
CK, MB: 3.2 ng/mL (ref 0.3–4.0)
Relative Index: 2 (ref 0.0–2.5)
Total CK: 157 U/L (ref 7–232)

## 2014-02-03 LAB — GLUCOSE, CAPILLARY
GLUCOSE-CAPILLARY: 110 mg/dL — AB (ref 70–99)
GLUCOSE-CAPILLARY: 126 mg/dL — AB (ref 70–99)
Glucose-Capillary: 129 mg/dL — ABNORMAL HIGH (ref 70–99)
Glucose-Capillary: 126 mg/dL — ABNORMAL HIGH (ref 70–99)

## 2014-02-03 LAB — TROPONIN I
TROPONIN I: 2.58 ng/mL — AB (ref <0.30)
TROPONIN I: 4.39 ng/mL — AB (ref <0.30)
Troponin I: 1.48 ng/mL (ref <0.30)

## 2014-02-03 LAB — PREPARE RBC (CROSSMATCH)

## 2014-02-03 LAB — LACTIC ACID, PLASMA: LACTIC ACID, VENOUS: 1.1 mmol/L (ref 0.5–2.2)

## 2014-02-03 LAB — MRSA PCR SCREENING: MRSA BY PCR: NEGATIVE

## 2014-02-03 MED ORDER — SODIUM CHLORIDE 0.9 % IV SOLN: 100.0000 mL | INTRAVENOUS | Status: DC | PRN

## 2014-02-03 MED ORDER — HEPARIN SODIUM (PORCINE) 1000 UNIT/ML DIALYSIS
1000.0000 [IU] | INTRAMUSCULAR | Status: DC | PRN
  Filled 2014-02-03: qty 1

## 2014-02-03 MED ORDER — NOREPINEPHRINE BITARTRATE 1 MG/ML IV SOLN
2.0000 ug/min | INTRAVENOUS | Status: DC
  Administered 2014-02-03: 5 ug/min via INTRAVENOUS
  Filled 2014-02-03: qty 16

## 2014-02-03 MED ORDER — ALBUTEROL SULFATE (2.5 MG/3ML) 0.083% IN NEBU: 2.5000 mg | INHALATION_SOLUTION | RESPIRATORY_TRACT | Status: DC | PRN

## 2014-02-03 MED ORDER — LIDOCAINE-PRILOCAINE 2.5-2.5 % EX CREA: 1.0000 "application " | TOPICAL_CREAM | CUTANEOUS | Status: DC | PRN

## 2014-02-03 MED ORDER — IOHEXOL 300 MG/ML  SOLN
100.0000 mL | Freq: Once | INTRAMUSCULAR | Status: AC | PRN
  Administered 2014-02-03: 100 mL via INTRAVENOUS

## 2014-02-03 MED ORDER — SODIUM CHLORIDE 0.9 % IV BOLUS (SEPSIS)
500.0000 mL | Freq: Once | INTRAVENOUS | Status: AC
  Administered 2014-02-03: 500 mL via INTRAVENOUS

## 2014-02-03 MED ORDER — INSULIN ASPART 100 UNIT/ML ~~LOC~~ SOLN
0.0000 [IU] | SUBCUTANEOUS | Status: DC
Start: 2014-02-03 — End: 2014-02-04
  Administered 2014-02-03 (×3): 1 [IU] via SUBCUTANEOUS

## 2014-02-03 MED ORDER — DEXTROSE 5 % IV SOLN
500.0000 mg | Freq: Once | INTRAVENOUS | Status: DC
  Filled 2014-02-03: qty 5

## 2014-02-03 MED ORDER — CHLORHEXIDINE GLUCONATE 0.12 % MT SOLN
15.0000 mL | Freq: Two times a day (BID) | OROMUCOSAL | Status: DC
  Administered 2014-02-03 – 2014-02-08 (×10): 15 mL via OROMUCOSAL
  Filled 2014-02-03 (×14): qty 15

## 2014-02-03 MED ORDER — ALTEPLASE 2 MG IJ SOLR: 2.0000 mg | Freq: Once | INTRAMUSCULAR | Status: AC | PRN

## 2014-02-03 MED ORDER — SODIUM CHLORIDE 0.9 % IV BOLUS (SEPSIS): 500.0000 mL | Freq: Once | INTRAVENOUS | Status: DC

## 2014-02-03 MED ORDER — LIDOCAINE HCL (PF) 1 % IJ SOLN: 5.0000 mL | INTRAMUSCULAR | Status: DC | PRN

## 2014-02-03 MED ORDER — FENTANYL CITRATE 0.05 MG/ML IJ SOLN
25.0000 ug | INTRAMUSCULAR | Status: DC | PRN
  Administered 2014-02-03 (×3): 50 ug via INTRAVENOUS
  Administered 2014-02-03 – 2014-02-04 (×2): 25 ug via INTRAVENOUS
  Administered 2014-02-04 – 2014-02-05 (×4): 50 ug via INTRAVENOUS
  Administered 2014-02-05: 100 ug via INTRAVENOUS
  Filled 2014-02-03 (×7): qty 2

## 2014-02-03 MED ORDER — NEPRO/CARBSTEADY PO LIQD: 237.0000 mL | ORAL | Status: DC | PRN

## 2014-02-03 MED ORDER — PANTOPRAZOLE SODIUM 40 MG IV SOLR
40.0000 mg | INTRAVENOUS | Status: DC
  Administered 2014-02-03 – 2014-02-04 (×2): 40 mg via INTRAVENOUS
  Filled 2014-02-03 (×3): qty 40

## 2014-02-03 MED ORDER — PENTAFLUOROPROP-TETRAFLUOROETH EX AERO
1.0000 | INHALATION_SPRAY | CUTANEOUS | Status: DC | PRN
Start: 2014-02-03 — End: 2014-02-09

## 2014-02-03 MED ORDER — SODIUM CHLORIDE 0.9 % IJ SOLN
10.0000 mL | Freq: Two times a day (BID) | INTRAMUSCULAR | Status: DC
  Administered 2014-02-03 – 2014-02-08 (×9): 10 mL via INTRAVENOUS

## 2014-02-03 MED ORDER — SODIUM CHLORIDE 0.9 % IV BOLUS (SEPSIS)
250.0000 mL | Freq: Once | INTRAVENOUS | Status: AC
Start: 2014-02-03 — End: 2014-02-03
  Administered 2014-02-03: 250 mL via INTRAVENOUS

## 2014-02-03 MED ORDER — SODIUM CHLORIDE 0.9 % IV SOLN
Freq: Once | INTRAVENOUS | Status: AC
  Administered 2014-02-04: 07:00:00 via INTRAVENOUS

## 2014-02-03 MED ORDER — ATORVASTATIN CALCIUM 20 MG PO TABS
20.0000 mg | ORAL_TABLET | Freq: Every day | ORAL | Status: DC
  Administered 2014-02-05 – 2014-02-08 (×4): 20 mg via ORAL
  Filled 2014-02-03 (×9): qty 1

## 2014-02-03 MED ORDER — CETYLPYRIDINIUM CHLORIDE 0.05 % MT LIQD
7.0000 mL | Freq: Two times a day (BID) | OROMUCOSAL | Status: DC
  Administered 2014-02-04 – 2014-02-09 (×8): 7 mL via OROMUCOSAL

## 2014-02-03 NOTE — Consult Note (Addendum)
Cardiology Consultation Note  Patient ID: Sean [name], MRN: [id number], DOB/AGE: 12-14-1935 78 y.o. Admit date: 01/27/2014   Date of Consult: 02/03/2014 Primary Physician: Philis Fendt, MD Primary Cardiologist: Dr. Tamala Julian  Chief Complaint: Left leg wound and AMS Reason for Consult: Elevated troponin (1.48)  HPI: 78 y/o M with h/o CAD s/p NSTEMI and failed RCA PCI 2011, ESRD on dialysis MWF, calciphylaxis, CVA, HTN, HLD, chronic anemia, DM2, dementia, LBBB who was readmitted to [facility] 8/18 for advanced leg wound and AMS after undergoing several recent admissions.   He has had several recent admissions to [facility] 7/4-7/7 for chest pain & leg ulcer. Found to have new LBBB. Underwent nuclear Myoview stress testing which was nonischemic, but did show prior MI involving the inferior wall and apex, EF 25%. EF during that admission was 45-50%. (Prior EF was normal in 2013.) Dr. Tamala Julian reviewed the case and felt LHC would be helpful, however, patient did not wish to proceed with this at that time as he wished to get his leg feeling better first. Readmitted 7/11-7/15 for wound care and chronic pain control of calciphylaxis. Admitted 8/8-8/12 for sepsis/AMS in the setting of missing dialysis-blood cultures grew staph aureus and citrobacter. Admission troponin 0.33 and ProBNP >70,000 at that time.   He was readmitted this time on 8/18 with advanced left leg wound due to calciphylaxis - found to be unsalvageable - and recurrent AMS. He underwent AKA of the left leg on the morning of 8/24. On 02/02/14 PM he became confused, hypotensive, not eating/drinking, and developed RUQ pain. Post op hgb dropped from 7.4 to 6.0. Developed a leukocytosis of 21.1, CT with acute cholecystitis, ongoing fever (Tmax 101.7), and concern for sepsis. CT of the head showed old infarct but no acute abnormality. Troponin of 1.48, previous level 0.33 during last admission. He has a h/o mildly elevated troponin in the setting of his ESRD  (baseline 0.5-0.9). Telemetry with NSR, frequent PACs, LBBB.           Past Medical History  Diagnosis Date  . Hyperparathyroidism   . Arthritis   . Hyperlipidemia   . Hypertension   . Neuromuscular disorder   . Stroke   . GERD (gastroesophageal reflux disease)   . Migraine   . Heart attack 2011; 1990's  . Type II diabetes mellitus   . Peripheral neuropathy   . Anxiety   . Depression   . Dialysis patient     M, W, F; [place] ESRD (end stage renal disease) on dialysis   . CAD (coronary artery disease)     a. s/p NSTEMI and failed RCA PCI in 2011. b. Nuc 12/2013: nonischemic, prior inferior MI.  Marland Kitchen LBBB (left bundle branch block)   . Calciphylaxis     a. Multiple admissions for nonhealing L ulcer/sepsis, s/p L AKA 02/02/2014.  Marland Kitchen Chronic anemia   . Dementia       Most Recent Cardiac Studies: Stress Test 12/14/13:  FINDINGS:  Immediate post regadenoson images demonstrated diminished activity  in the inferior wall, anteroapical wall and apicoseptal wall.  Initial resting images demonstrate similar findings. No evidence of  reversibility to suggest ischemia. Findings confirmed by the  computer generated polar map.  Gated images demonstrate global hypokinesis with essential akinesis  involving the inferior wall.  Estimated QGS left ventricular ejection fraction measured 25%, with  an end-diastolic volume of 737 ml and an end systolic volume of 106  ml.  IMPRESSION:  1. Prior MI  involving the inferior wall and apex.  2. No evidence of myocardial ischemia.  3. Left ventricular enlargement.  4. Global hypokinesis with essential akinesis in the inferior wall.  5. Estimated QGS ejection fraction 25%  Echo 12/15/13:  Study Conclusions  - Left ventricle: Poorly visualized consider repeat with contrast Abnormal septal motion. The cavity size was mildly dilated. Wall thickness was normal. Systolic function was mildly reduced. The estimated ejection fraction was in the  range of 45% to 50%. - Mitral valve: Calcified annulus. - Left atrium: The atrium was mildly dilated. - Atrial septum: No defect or patent foramen ovale was identified.     Surgical History:  Past Surgical History  Procedure Laterality Date  . Colon surgery  2011    colon resection   . Parathyroidectomy  2011  . Capd insertion  09/12/2011    Procedure: LAPAROSCOPIC INSERTION CONTINUOUS AMBULATORY PERITONEAL DIALYSIS  (CAPD) CATHETER;  Surgeon: Edward Jolly, MD;  Location: [place];  Service: General;  Laterality: N/A;  . Sp av dialysis shunt access existing *l*  2003  . Dg av dialysis shunt intro ndl*r* or      "not working" (09/19/11)  . Capd removal  03/12/2012    Procedure: CONTINUOUS AMBULATORY PERITONEAL DIALYSIS  (CAPD) CATHETER REMOVAL;  Surgeon: Edward Jolly, MD;  Location: [place];  Service: General;  Laterality: N/A;     Home Meds: Prior to Admission medications   Medication Sig Start Date End Date Taking? Authorizing Provider  acetaminophen (TYLENOL) 325 MG tablet Take 2 tablets (650 mg total) by mouth every 6 (six) hours as needed for mild pain, fever or headache. 12/24/13  Yes Debbe Odea, MD  albuterol (PROVENTIL) (2.5 MG/3ML) 0.083% nebulizer solution Take 3 mLs (2.5 mg total) by nebulization every 6 (six) hours as needed for wheezing or shortness of breath. 01/21/14  Yes Ripudeep Krystal Eaton, MD  aspirin 325 MG tablet Take 325 mg by mouth every evening.   Yes Historical Provider, MD  atorvastatin (LIPITOR) 20 MG tablet Take 20 mg by mouth at bedtime.   Yes Historical Provider, MD  b complex-vitamin c-folic acid (NEPHRO-VITE) 0.8 MG TABS Take 0.8 mg by mouth at bedtime.   Yes Historical Provider, MD  carvedilol (COREG) 3.125 MG tablet Take 1 tablet (3.125 mg total) by mouth 2 (two) times daily with a meal. 12/16/13  Yes Debbe Odea, MD  collagenase (SANTYL) ointment Apply 1 application topically daily. 01/21/14  Yes Ripudeep Krystal Eaton, MD  HYDROcodone-acetaminophen  (NORCO/VICODIN) 5-325 MG per tablet Take 1 tablet by mouth every 6 (six) hours as needed for moderate pain or severe pain. 01/22/14  Yes Tiffany L Reed, DO  hydrOXYzine (ATARAX/VISTARIL) 10 MG tablet Take 10 mg by mouth at bedtime as needed (for sleep).    Yes Historical Provider, MD  isosorbide-hydrALAZINE (BIDIL) 20-37.5 MG per tablet Take 1 tablet by mouth 2 (two) times daily. 12/16/13  Yes Debbe Odea, MD  levofloxacin (LEVAQUIN) 500 MG tablet Take 1 tablet (500 mg total) by mouth every other day. X 2 weeks 01/21/14  Yes Ripudeep Krystal Eaton, MD  PARoxetine (PAXIL) 20 MG tablet Take 1 tablet (20 mg total) by mouth daily. 01/21/14  Yes Ripudeep Krystal Eaton, MD  feeding supplement, RESOURCE BREEZE, (RESOURCE BREEZE) LIQD Take 1 Container by mouth 2 (two) times daily between meals. 01/21/14   Ripudeep Krystal Eaton, MD    Inpatient Medications:  . sodium chloride   Intravenous Once  . darbepoetin (ARANESP) injection - DIALYSIS  150  mcg Intravenous Q Wed-HD  . ferric gluconate (FERRLECIT/NULECIT) IV  62.5 mg Intravenous Q Wed-HD  . heparin  5,000 Units Subcutaneous 3 times per day  . insulin aspart  0-9 Units Subcutaneous 6 times per day  . pantoprazole (PROTONIX) IV  40 mg Intravenous Q24H  . piperacillin-tazobactam (ZOSYN)  IV  2.25 g Intravenous 3 times per day  . sodium chloride  3 mL Intravenous Q12H  . [START ON 02/04/2014] vancomycin  1,000 mg Intravenous Q M,W,F-HD  . vancomycin  1,000 mg Intravenous Once   . norepinephrine (LEVOPHED) Adult infusion 10 mcg/min (02/03/14 1130)    Allergies:  Allergies  Allergen Reactions  . Benazepril Hcl Hives, Itching and Rash    flareups on patient head    History   Social History  . Marital Status: Married    Spouse Name: N/A    Number of Children: N/A  . Years of Education: N/A   Occupational History  . Not on file.   Social History Main Topics  . Smoking status: Former Smoker -- 0.25 packs/day for 7 years    Types: Cigarettes    Quit date: 06/12/1976   . Smokeless tobacco: Never Used  . Alcohol Use: No  . Drug Use: No  . Sexual Activity: No   Other Topics Concern  . Not on file   Social History Narrative  . No narrative on file     Family History  Problem Relation Age of Onset  . Cancer Sister     breast  . Cancer Brother     lung  . Anesthesia problems Neg Hx   . Hypotension Neg Hx   . Malignant hyperthermia Neg Hx   . Pseudochol deficiency Neg Hx      Review of Systems: Unable to obtain 2/2 dementia.   Labs:  Recent Labs  02/03/14 1030  CKTOTAL 157  CKMB 3.2  TROPONINI 1.48*   Lab Results  Component Value Date   WBC 21.1* 02/03/2014   HGB 6.0* 02/03/2014   HCT 19.3* 02/03/2014   MCV 85.8 02/03/2014   PLT 350 02/03/2014    Recent Labs Lab 01/27/14 2100  02/03/14 0711  NA 135*  < > 137  K 4.3  < > 4.6  CL 94*  < > 97  CO2 29  < > 25  BUN 13  < > 22  CREATININE 4.80*  < > 5.31*  CALCIUM 8.7  < > 8.3*  PROT 7.3  --   --   BILITOT 0.6  --   --   ALKPHOS 85  --   --   ALT 9  --   --   AST 28  --   --   GLUCOSE 117*  < > 121*  < > = values in this interval not displayed. Lab Results  Component Value Date   CHOL 161 09/20/2011   HDL 34* 09/20/2011   LDLCALC 103* 09/20/2011   TRIG 121 09/20/2011   No results found for this basename: DDIMER    Radiology/Studies:  Ct Head Wo Contrast  02/03/2014   CLINICAL DATA:  Mental status change.  EXAM: CT HEAD WITHOUT CONTRAST  TECHNIQUE: Contiguous axial images were obtained from the base of the skull through the vertex without intravenous contrast.  COMPARISON:  CT 01/17/2014.  12/13/2013.  FINDINGS: No intra-axial or extra-axial pathologic fluid or blood collection. Left occipital encephalomalacia. White matter changes consistent with chronic ischemia. Cerebral vascular disease. Visualized orbits and paranasal sinuses are clear. Mastoids  are clear. No acute bony abnormality.  IMPRESSION: 1. Old left occipital infarct. Chronic white matter ischemic change. Diffuse  atrophy. 2. No acute abnormality.  Exam stable from prior exams.   Electronically Signed   By: Marcello Moores  Register   On: 02/03/2014 12:31   Ct Head Wo Contrast  01/17/2014   CLINICAL DATA:  Altered mental status.  Weakness.  Dialysis patient.  EXAM: CT HEAD WITHOUT CONTRAST  TECHNIQUE: Contiguous axial images were obtained from the base of the skull through the vertex without intravenous contrast.  COMPARISON:  12/13/2013.  FINDINGS: Stable cerebral atrophy, ventriculomegaly and periventricular white matter disease. Remote left occipital infarct with encephalomalacia ex vacuo dilatation of the occipital horn of the left lateral ventricle. No extra-axial fluid collections are identified. No CT findings for acute hemispheric infarction or intracranial hemorrhage. No mass lesions. The brainstem and cerebellum are grossly normal and stable.  Stable dense vascular calcifications. The bony structures are intact. No fracture or bone lesion. The paranasal sinuses and mastoid air cells are clear. The globes are intact.  IMPRESSION: Stable atrophy, ventriculomegaly and periventricular white matter disease.  Remote left occipital infarct.  No acute intracranial findings or mass lesions.   Electronically Signed   By: Kalman Jewels M.D.   On: 01/17/2014 08:28   Ct Abdomen Pelvis W Contrast  02/03/2014   CLINICAL DATA:  Right upper quadrant abdominal pain. Possible colonic ischemia. Patient has a history of dialysis.  EXAM: CT ABDOMEN AND PELVIS WITH CONTRAST  TECHNIQUE: Multidetector CT imaging of the abdomen and pelvis was performed using the standard protocol following bolus administration of intravenous contrast.  CONTRAST:  169mL OMNIPAQUE IOHEXOL 300 MG/ML  SOLN  COMPARISON:  12/09/2011.  FINDINGS: Gallbladder is distended with wall thickening and pericholecystic inflammation/edema. There are dependent small stones. Findings are consistent with acute cholecystitis given history.  Heart is mild to moderately enlarged.  There is dependent subsegmental atelectasis.  Liver and spleen are unremarkable. Common bile duct is normal in caliber for age. No pancreatic masses or inflammatory change. No adrenal masses.  Diffuse bilateral renal cortical thinning. Multiple low-density renal masses consistent with cysts. No hydronephrosis. Normal ureters. Bladder is decompressed.  No pathologically enlarged lymph nodes.  No ascites.  There are atherosclerotic vascular calcifications throughout the aorta and its branch vessels. Significant stenosis is noted throughout the superior mesenteric a arteries/celiac axis. The superior mesenteric artery and celiac axis appear to originate from a common trunk from the aorta.  There are changes from previous right colon surgery. There is no bowel wall thickening or bowel wall air. No evidence of colonic or small bowel ischemia.  There is a small anterior abdominal wall fat containing hernia adjacent to a midline incision.  Bone show changes consistent with renal osteodystrophy. There are prominent subchondral cysts along both superior acetabula.  IMPRESSION: 1. Acute cholecystitis.  No evidence of ischemic bowel. 2. Multiple chronic findings as detailed. No other evidence of an acute abnormality.   Electronically Signed   By: Lajean Manes M.D.   On: 02/03/2014 12:35   Ir Fluoro Guide Cv Line Right  01/29/2014   CLINICAL DATA:  Leg infection  EXAM: RIGHT INTERNAL JUGULAR PICC LINE PLACEMENT WITH ULTRASOUND AND FLUOROSCOPIC GUIDANCE  FLUOROSCOPY TIME:  1 min and 18 seconds.  PROCEDURE: The patient was advised of the possible risks andcomplications and agreed to undergo the procedure. The patient was then brought to the angiographic suite for the procedure.  The right neck was prepped with chlorhexidine, drapedin  the usual sterile fashion using maximum barrier technique (cap and mask, sterile gown, sterile gloves, large sterile sheet, hand hygiene and cutaneous antisepsis) and infiltrated locally with 1%  Lidocaine.  Ultrasound demonstrated patency of the right internal jugular vein, and this was documented with an image. Under real-time ultrasound guidance, this vein was accessed with a 21 gauge micropuncture needle and image documentation was performed. A 0.018 wire was introduced in to the vein. Over this, a 5 [place] double lumen Power PICC was advanced to the lower SVC/right atrial junction. Fluoroscopy during the procedure and fluoro spot radiograph confirms appropriate catheter position. The catheter was flushed and covered with asterile dressing.  Complications: None  IMPRESSION: Successful right arm Power PICC line placement with ultrasound and fluoroscopic guidance. The catheter is ready for use.   Electronically Signed   By: Maryclare Bean M.D.   On: 01/29/2014 17:00   Ir US Guide Vasc Access Right  01/29/2014   CLINICAL DATA:  Leg infection  EXAM: RIGHT INTERNAL JUGULAR PICC LINE PLACEMENT WITH ULTRASOUND AND FLUOROSCOPIC GUIDANCE  FLUOROSCOPY TIME:  1 min and 18 seconds.  PROCEDURE: The patient was advised of the possible risks andcomplications and agreed to undergo the procedure. The patient was then brought to the angiographic suite for the procedure.  The right neck was prepped with chlorhexidine, drapedin the usual sterile fashion using maximum barrier technique (cap and mask, sterile gown, sterile gloves, large sterile sheet, hand hygiene and cutaneous antisepsis) and infiltrated locally with 1% Lidocaine.  Ultrasound demonstrated patency of the right internal jugular vein, and this was documented with an image. Under real-time ultrasound guidance, this vein was accessed with a 21 gauge micropuncture needle and image documentation was performed. A 0.018 wire was introduced in to the vein. Over this, a 5 [place] double lumen Power PICC was advanced to the lower SVC/right atrial junction. Fluoroscopy during the procedure and fluoro spot radiograph confirms appropriate catheter position. The catheter was  flushed and covered with asterile dressing.  Complications: None  IMPRESSION: Successful right arm Power PICC line placement with ultrasound and fluoroscopic guidance. The catheter is ready for use.   Electronically Signed   By: Maryclare Bean M.D.   On: 01/29/2014 17:00   Dg Chest Port 1 View  02/03/2014   CLINICAL DATA:  Assess airspace disease  EXAM: PORTABLE CHEST - 1 VIEW  COMPARISON:  01/27/2014  FINDINGS: Mild enlargement of the cardiac silhouette, stable. The aorta is uncoiled. No mediastinal or hilar masses.  Mild hazy left lung base opacity, most likely atelectasis. Infiltrate is possible. No pulmonary edema. No pleural effusion or pneumothorax.  Right central venous line has its tip near the caval atrial junction, well positioned.  IMPRESSION: Mild hazy opacity at the left lung base. This is likely atelectasis. A small pneumonia is possible. This is similar to opacity noted on the prior study, which supports atelectasis. Lungs otherwise clear.   Electronically Signed   By: Lajean Manes M.D.   On: 02/03/2014 12:54   Dg Chest Port 1 View  01/27/2014   CLINICAL DATA:  Fever.  Wound infection.  EXAM: PORTABLE CHEST - 1 VIEW  COMPARISON:  01/17/2014  FINDINGS: There is tortuosity and calcification of the thoracic aorta. There are no infiltrates or effusions. Aeration has improved bilaterally. No acute osseous abnormality.  IMPRESSION: Resolution of interstitial edema.   Electronically Signed   By: Rozetta Nunnery M.D.   On: 01/27/2014 19:30   Dg Chest Port 1 View  01/17/2014  CLINICAL DATA:  Weakness.  History of end-stage renal disease.  EXAM: PORTABLE CHEST - 1 VIEW  COMPARISON:  12/20/2013  FINDINGS: There is stable mild cardiac enlargement. Lungs show evidence of mild interstitial edema. There is no evidence of airspace consolidation, pneumothorax, nodule or pleural fluid.  IMPRESSION: Cardiomegaly and mild interstitial edema.   Electronically Signed   By: Aletta Edouard M.D.   On: 01/17/2014 09:41     EKG: Sinus tach, 108, LBBB, no apparent st/t changes   Physical Exam: Blood pressure 98/51, pulse 107, temperature 97.6 F (36.4 C), temperature source Oral, resp. rate 28, height 5\' 3"  (1.6 m), weight 180 lb 5.4 oz (81.8 kg), SpO2 100.00%. General: Well developed, well nourished, in no acute distress. Head: Normocephalic, atraumatic, sclera non-icteric, no xanthomas, nares are without discharge.  Neck: Negative for carotid bruits. JVD not elevated. Lungs: Clear bilaterally to auscultation without wheezes, rales, or rhonchi. Breathing is unlabored. Heart: Tachycardic with S1 S2. 3/6 systolic murmur LUSB & RUSB. No rubs or gallops appreciated. Abdomen: Soft, non-tender, non-distended with normoactive bowel sounds. No hepatomegaly. No rebound/guarding. No obvious abdominal masses. Msk:  Strength and tone appear normal for age. Extremities: No clubbing or cyanosis. No edema.  Dressing on LEE. Neuro: Alert, somewhat slowed mental status.    Assessment: 78 y/o M with h/o CAD s/p NSTEMI and failed RCA PCI 2011, ESRD on dialysis MWF, calciphylaxis, CVA, HTN, HLD, chronic anemia, DM2, dementia, recently identified LBBB who was has undergone several admissions to [facility] recently (7/4-7/7 for chest pain & and leg ulcer; 8/8-8/12 for AMS/sepsis in the setting of missing dialysis-tx for sepsis-blood cx grew staph aureus, citrobacter) and was readmitted 8/18 for advanced left leg wound and recurrent AMS. On 8/24 AM he had AKA of the left leg. That evening he became confused, hypotensive, not wanting to eat/drink, developed RUQ pain, developed acute on chronic anemia (7.4-->6.0), and a troponin of 1.48. He is currently chest pain free.        Plan:  1. Demand ischemia: -Mildly elevated at baseline (0.5-0.9), first troponin currently 1.48 -Echo pending-per echo tech EF still 25% -Recent nuclear Myoview 12/2013 without ischemia, EF 25% (echo EF 45-50% @ same time), prior MI. Dr. Tamala Julian did wish to proceed  with cardiac catheterization at that time, however he did not want to 2/2 he wanted to get his leg wound in better shape first. Given his current medical status (hgb 6.0, acute cholecystitis, and dementia) would manage medically at this time pending further troponin and echo results-will discuss with MD -Can revisit discussion of cath when acute issues above are under better control  -Hold heparin at this time given significant acute on chronic anemia (RBC transfusion is on the way) -Attempted to call patient's daughter to get more detailed history-LM on VM   -Unable to use b-blocker 2/2 recent shock currently on norepi. Resume when able.    2. CAD: -Optimize medical therapy -TC 161, LDL 103, HDL 34, trig 121  -Will start on Lipitor 20 mg          3. Possible ischemic cardiomyopathy with recent drop in LV function (EF ~25%): - Volume managed with HD - No BB/ACEI secondary to recent shock and soft BP  Signed, DUNN,RYAN PA-C 02/03/2014, 1:58 PM  Personally seen and examined. Agree with above. Currently combined septic as well as hemorrhagic shock. Obtaining 2 units PRBC. EF remains 25%. Watch for signs of fluid overload. (Hemodialysis if necessary) Mildly elevated troponin, no chest pain. Demand ischemia.  No aspirin/heparin because of bleeding. Starting statin.  Candee Furbish, MD

## 2014-02-03 NOTE — Progress Notes (Signed)
Paged Dr. Inis Sizer d/t SBP in 55s. Per MD order gave 250cc bouls. Pt's BP did not respond so Probation officer paged Dr. Inis Sizer again and order received for 500cc bolus and to transfer pt to ICU. Paged Rapid Response d/t change in LOC overnight and hypotension. Per daughter pt is less alert and responsive than yesterday evening. Dr. Inis Sizer and Montey Hora present at bedside right before transfer. Pt transferred to 2M15 without issue. CCMD notified of pt's transfer.

## 2014-02-03 NOTE — Progress Notes (Signed)
PT Cancellation Note  Patient Details Name: Sean [name] MRN: [id number] DOB: March 13, 1936   Cancelled Treatment:    Reason Eval/Treat Not Completed: Patient not medically ready.  Pt with low BPs this morning and per RN wait until tomorrow.  Note new transfer to ICU.  Will continue to check on pt.    Denice Bors 02/03/2014, 11:01 AM

## 2014-02-03 NOTE — Progress Notes (Signed)
02/03/2014 1:57 PM  Touched base with RN from ICU in regards to making sure that the patient's daughter(s) were notified of patient transfer, and if haven't been already, was going to call them myself to make sure they were informed of him transferring.  Per the RN the daughters are aware, and one is currently at the bedside.  Informed the RN to let them know that if they need anything further from the [facility] staff to not hesitate to call us.  Princella Pellegrini

## 2014-02-03 NOTE — Progress Notes (Signed)
Patient ID: Sean [name], male   DOB: 1936-05-18, 78 y.o.   MRN: [id number] Request received from [place] for percutaneous cholecystostomy placement in pt with cholecystitis/poor surgical candidate. Pt is a 78 yo BM with sig PMH of ESRD (MWF HD- via LUE graft, prev peritoneal dialysis)), CAD (s/p MI 2011,7/15) and now with demand ischemia/EF 25%,HTN, DM,hyperparathyroidism, recent LLE wound infection requiring AKA on 8/24. Post op , pt developed confusion, hypotension requiring levophed. CT head showed no acute abnormality. Pt also has had worsening abd pain , anemia (s/p transfusion) and rising WBC count. CT abd/pelvis on 8/25 revealed evidence of acute cholecystitis. Imaging studies were reviewed by Dr. Pascal Lux. Additional hx as listed below. Exam: pt awake but confused with known dementia; chest- sl dim BS left base, right clear; intact rt IJ PICC; heart-tachy with 3/6 SEM; abd- soft,+BS, tender RUQ/epigastric regions; ext- left AKA, no RLE edema.   Filed Vitals:   02/03/14 1650 02/03/14 1700 02/03/14 1759 02/03/14 1800  BP: 91/58 93/60 99/60  99/60  Pulse: 106 108 106 106  Temp: 98.7 F (37.1 C)   97.3 F (36.3 C)  TempSrc: Oral   Oral  Resp: 19 17 19 20   Height:      Weight:      SpO2: 100% 100% 100% 99%   Past Medical History  Diagnosis Date  . Hyperparathyroidism   . Arthritis   . Hyperlipidemia   . Hypertension   . Neuromuscular disorder   . Stroke   . GERD (gastroesophageal reflux disease)   . Migraine   . Heart attack 2011; 1990's  . Type II diabetes mellitus   . Peripheral neuropathy   . Anxiety   . Depression   . Dialysis patient     M, W, F; [place] ESRD (end stage renal disease) on dialysis   . CAD (coronary artery disease)     a. s/p NSTEMI and failed RCA PCI in 2011. b. Nuc 12/2013: nonischemic, prior inferior MI.  Marland Kitchen LBBB (left bundle branch block)   . Calciphylaxis     a. Multiple admissions for nonhealing L ulcer/sepsis, s/p L AKA 02/02/2014.  Marland Kitchen Chronic  anemia   . Dementia    Past Surgical History  Procedure Laterality Date  . Colon surgery  2011    colon resection   . Parathyroidectomy  2011  . Capd insertion  09/12/2011    Procedure: LAPAROSCOPIC INSERTION CONTINUOUS AMBULATORY PERITONEAL DIALYSIS  (CAPD) CATHETER;  Surgeon: Edward Jolly, MD;  Location: [facility];  Service: General;  Laterality: N/A;  . Sp av dialysis shunt access existing *l*  2003  . Dg av dialysis shunt intro ndl*r* or      "not working" (09/19/11)  . Capd removal  03/12/2012    Procedure: CONTINUOUS AMBULATORY PERITONEAL DIALYSIS  (CAPD) CATHETER REMOVAL;  Surgeon: Edward Jolly, MD;  Location: [facility];  Service: General;  Laterality: N/A;  . Amputation Left 02/02/2014    Procedure: AMPUTATION ABOVE KNEE-LEFT;  Surgeon: Elam Dutch, MD;  Location: [facility] OR;  Service: Vascular;  Laterality: Left;  Ct Head Wo Contrast  02/03/2014   CLINICAL DATA:  Mental status change.  EXAM: CT HEAD WITHOUT CONTRAST  TECHNIQUE: Contiguous axial images were obtained from the base of the skull through the vertex without intravenous contrast.  COMPARISON:  CT 01/17/2014.  12/13/2013.  FINDINGS: No intra-axial or extra-axial pathologic fluid or blood collection. Left occipital encephalomalacia. White matter changes consistent with chronic ischemia. Cerebral vascular disease.  Visualized orbits and paranasal sinuses are clear. Mastoids are clear. No acute bony abnormality.  IMPRESSION: 1. Old left occipital infarct. Chronic white matter ischemic change. Diffuse atrophy. 2. No acute abnormality.  Exam stable from prior exams.   Electronically Signed   By: Marcello Moores  Register   On: 02/03/2014 12:31   Ct Head Wo Contrast  01/17/2014   CLINICAL DATA:  Altered mental status.  Weakness.  Dialysis patient.  EXAM: CT HEAD WITHOUT CONTRAST  TECHNIQUE: Contiguous axial images were obtained from the base of the skull through the vertex without intravenous contrast.  COMPARISON:  12/13/2013.  FINDINGS:  Stable cerebral atrophy, ventriculomegaly and periventricular white matter disease. Remote left occipital infarct with encephalomalacia ex vacuo dilatation of the occipital horn of the left lateral ventricle. No extra-axial fluid collections are identified. No CT findings for acute hemispheric infarction or intracranial hemorrhage. No mass lesions. The brainstem and cerebellum are grossly normal and stable.  Stable dense vascular calcifications. The bony structures are intact. No fracture or bone lesion. The paranasal sinuses and mastoid air cells are clear. The globes are intact.  IMPRESSION: Stable atrophy, ventriculomegaly and periventricular white matter disease.  Remote left occipital infarct.  No acute intracranial findings or mass lesions.   Electronically Signed   By: Kalman Jewels M.D.   On: 01/17/2014 08:28   Ct Abdomen Pelvis W Contrast  02/03/2014   CLINICAL DATA:  Right upper quadrant abdominal pain. Possible colonic ischemia. Patient has a history of dialysis.  EXAM: CT ABDOMEN AND PELVIS WITH CONTRAST  TECHNIQUE: Multidetector CT imaging of the abdomen and pelvis was performed using the standard protocol following bolus administration of intravenous contrast.  CONTRAST:  150mL OMNIPAQUE IOHEXOL 300 MG/ML  SOLN  COMPARISON:  12/09/2011.  FINDINGS: Gallbladder is distended with wall thickening and pericholecystic inflammation/edema. There are dependent small stones. Findings are consistent with acute cholecystitis given history.  Heart is mild to moderately enlarged. There is dependent subsegmental atelectasis.  Liver and spleen are unremarkable. Common bile duct is normal in caliber for age. No pancreatic masses or inflammatory change. No adrenal masses.  Diffuse bilateral renal cortical thinning. Multiple low-density renal masses consistent with cysts. No hydronephrosis. Normal ureters. Bladder is decompressed.  No pathologically enlarged lymph nodes.  No ascites.  There are atherosclerotic  vascular calcifications throughout the aorta and its branch vessels. Significant stenosis is noted throughout the superior mesenteric a arteries/celiac axis. The superior mesenteric artery and celiac axis appear to originate from a common trunk from the aorta.  There are changes from previous right colon surgery. There is no bowel wall thickening or bowel wall air. No evidence of colonic or small bowel ischemia.  There is a small anterior abdominal wall fat containing hernia adjacent to a midline incision.  Bone show changes consistent with renal osteodystrophy. There are prominent subchondral cysts along both superior acetabula.  IMPRESSION: 1. Acute cholecystitis.  No evidence of ischemic bowel. 2. Multiple chronic findings as detailed. No other evidence of an acute abnormality.   Electronically Signed   By: Lajean Manes M.D.   On: 02/03/2014 12:35   Ir Fluoro Guide Cv Line Right  01/29/2014   CLINICAL DATA:  Leg infection  EXAM: RIGHT INTERNAL JUGULAR PICC LINE PLACEMENT WITH ULTRASOUND AND FLUOROSCOPIC GUIDANCE  FLUOROSCOPY TIME:  1 min and 18 seconds.  PROCEDURE: The patient was advised of the possible risks andcomplications and agreed to undergo the procedure. The patient was then brought to the angiographic suite for the procedure.  The right neck was prepped with chlorhexidine, drapedin the usual sterile fashion using maximum barrier technique (cap and mask, sterile gown, sterile gloves, large sterile sheet, hand hygiene and cutaneous antisepsis) and infiltrated locally with 1% Lidocaine.  Ultrasound demonstrated patency of the right internal jugular vein, and this was documented with an image. Under real-time ultrasound guidance, this vein was accessed with a 21 gauge micropuncture needle and image documentation was performed. A 0.018 wire was introduced in to the vein. Over this, a 5 [place] double lumen Power PICC was advanced to the lower SVC/right atrial junction. Fluoroscopy during the procedure and  fluoro spot radiograph confirms appropriate catheter position. The catheter was flushed and covered with asterile dressing.  Complications: None  IMPRESSION: Successful right arm Power PICC line placement with ultrasound and fluoroscopic guidance. The catheter is ready for use.   Electronically Signed   By: Maryclare Bean M.D.   On: 01/29/2014 17:00   Ir US Guide Vasc Access Right  01/29/2014   CLINICAL DATA:  Leg infection  EXAM: RIGHT INTERNAL JUGULAR PICC LINE PLACEMENT WITH ULTRASOUND AND FLUOROSCOPIC GUIDANCE  FLUOROSCOPY TIME:  1 min and 18 seconds.  PROCEDURE: The patient was advised of the possible risks andcomplications and agreed to undergo the procedure. The patient was then brought to the angiographic suite for the procedure.  The right neck was prepped with chlorhexidine, drapedin the usual sterile fashion using maximum barrier technique (cap and mask, sterile gown, sterile gloves, large sterile sheet, hand hygiene and cutaneous antisepsis) and infiltrated locally with 1% Lidocaine.  Ultrasound demonstrated patency of the right internal jugular vein, and this was documented with an image. Under real-time ultrasound guidance, this vein was accessed with a 21 gauge micropuncture needle and image documentation was performed. A 0.018 wire was introduced in to the vein. Over this, a 5 [place] double lumen Power PICC was advanced to the lower SVC/right atrial junction. Fluoroscopy during the procedure and fluoro spot radiograph confirms appropriate catheter position. The catheter was flushed and covered with asterile dressing.  Complications: None  IMPRESSION: Successful right arm Power PICC line placement with ultrasound and fluoroscopic guidance. The catheter is ready for use.   Electronically Signed   By: Maryclare Bean M.D.   On: 01/29/2014 17:00   Dg Chest Port 1 View  02/03/2014   CLINICAL DATA:  Assess airspace disease  EXAM: PORTABLE CHEST - 1 VIEW  COMPARISON:  01/27/2014  FINDINGS: Mild enlargement of  the cardiac silhouette, stable. The aorta is uncoiled. No mediastinal or hilar masses.  Mild hazy left lung base opacity, most likely atelectasis. Infiltrate is possible. No pulmonary edema. No pleural effusion or pneumothorax.  Right central venous line has its tip near the caval atrial junction, well positioned.  IMPRESSION: Mild hazy opacity at the left lung base. This is likely atelectasis. A small pneumonia is possible. This is similar to opacity noted on the prior study, which supports atelectasis. Lungs otherwise clear.   Electronically Signed   By: Lajean Manes M.D.   On: 02/03/2014 12:54   Dg Chest Port 1 View  01/27/2014   CLINICAL DATA:  Fever.  Wound infection.  EXAM: PORTABLE CHEST - 1 VIEW  COMPARISON:  01/17/2014  FINDINGS: There is tortuosity and calcification of the thoracic aorta. There are no infiltrates or effusions. Aeration has improved bilaterally. No acute osseous abnormality.  IMPRESSION: Resolution of interstitial edema.   Electronically Signed   By: Rozetta Nunnery M.D.   On: 01/27/2014 19:30  Dg Chest Port 1 View  01/17/2014   CLINICAL DATA:  Weakness.  History of end-stage renal disease.  EXAM: PORTABLE CHEST - 1 VIEW  COMPARISON:  12/20/2013  FINDINGS: There is stable mild cardiac enlargement. Lungs show evidence of mild interstitial edema. There is no evidence of airspace consolidation, pneumothorax, nodule or pleural fluid.  IMPRESSION: Cardiomegaly and mild interstitial edema.   Electronically Signed   By: Aletta Edouard M.D.   On: 01/17/2014 09:41  Results for orders placed during the hospital encounter of 01/27/14  CULTURE, BLOOD (ROUTINE X 2)      Result Value Ref Range   Specimen Description BLOOD RIGHT HAND     Special Requests BOTTLES DRAWN AEROBIC AND ANAEROBIC 10 CC     Culture  Setup Time       Value: 02/01/2014 00:09     Performed at [facility]   Culture       Value:        BLOOD CULTURE RECEIVED NO GROWTH TO DATE CULTURE WILL BE HELD FOR 5 DAYS  BEFORE ISSUING A FINAL NEGATIVE REPORT     Performed at [facility]   Report Status PENDING    CULTURE, BLOOD (ROUTINE X 2)      Result Value Ref Range   Specimen Description BLOOD RIGHT HAND     Special Requests BOTTLES DRAWN AEROBIC AND ANAEROBIC 5CC EACH     Culture  Setup Time       Value: 02/01/2014 17:45     Performed at [facility]   Culture       Value:        BLOOD CULTURE RECEIVED NO GROWTH TO DATE CULTURE WILL BE HELD FOR 5 DAYS BEFORE ISSUING A FINAL NEGATIVE REPORT     Performed at [facility]   Report Status PENDING    SURGICAL PCR SCREEN      Result Value Ref Range   MRSA, PCR NEGATIVE  NEGATIVE   Staphylococcus aureus POSITIVE (*) NEGATIVE  MRSA PCR SCREENING      Result Value Ref Range   MRSA by PCR NEGATIVE  NEGATIVE  CBC WITH DIFFERENTIAL      Result Value Ref Range   WBC 10.9 (*) 4.0 - 10.5 K/uL   RBC 3.18 (*) 4.22 - 5.81 MIL/uL   Hemoglobin 8.5 (*) 13.0 - 17.0 g/dL   HCT 27.4 (*) 39.0 - 52.0 %   MCV 86.2  78.0 - 100.0 fL   MCH 26.7  26.0 - 34.0 pg   MCHC 31.0  30.0 - 36.0 g/dL   RDW 18.0 (*) 11.5 - 15.5 %   Platelets 295  150 - 400 K/uL   Neutrophils Relative % 75  43 - 77 %   Neutro Abs 8.1 (*) 1.7 - 7.7 K/uL   Lymphocytes Relative 8 (*) 12 - 46 %   Lymphs Abs 0.9  0.7 - 4.0 K/uL   Monocytes Relative 16 (*) 3 - 12 %   Monocytes Absolute 1.8 (*) 0.1 - 1.0 K/uL   Eosinophils Relative 1  0 - 5 %   Eosinophils Absolute 0.1  0.0 - 0.7 K/uL   Basophils Relative 0  0 - 1 %   Basophils Absolute 0.0  0.0 - 0.1 K/uL  COMPREHENSIVE METABOLIC PANEL      Result Value Ref Range   Sodium 135 (*) 137 - 147 mEq/L   Potassium 4.3  3.7 - 5.3 mEq/L   Chloride 94 (*) 96 -  112 mEq/L   CO2 29  19 - 32 mEq/L   Glucose, Bld 117 (*) 70 - 99 mg/dL   BUN 13  6 - 23 mg/dL   Creatinine, Ser 4.80 (*) 0.50 - 1.35 mg/dL   Calcium 8.7  8.4 - 10.5 mg/dL   Total Protein 7.3  6.0 - 8.3 g/dL   Albumin 1.9 (*) 3.5 - 5.2 g/dL   AST 28  0 - 37 U/L   ALT  9  0 - 53 U/L   Alkaline Phosphatase 85  39 - 117 U/L   Total Bilirubin 0.6  0.3 - 1.2 mg/dL   GFR calc non Af Amer 11 (*) >90 mL/min   GFR calc Af Amer 12 (*) >90 mL/min   Anion gap 12  5 - 15  CBC      Result Value Ref Range   WBC 10.5  4.0 - 10.5 K/uL   RBC 3.33 (*) 4.22 - 5.81 MIL/uL   Hemoglobin 8.8 (*) 13.0 - 17.0 g/dL   HCT 28.5 (*) 39.0 - 52.0 %   MCV 85.6  78.0 - 100.0 fL   MCH 26.4  26.0 - 34.0 pg   MCHC 30.9  30.0 - 36.0 g/dL   RDW 18.4 (*) 11.5 - 15.5 %   Platelets 316  150 - 400 K/uL  BASIC METABOLIC PANEL      Result Value Ref Range   Sodium 134 (*) 137 - 147 mEq/L   Potassium 4.6  3.7 - 5.3 mEq/L   Chloride 95 (*) 96 - 112 mEq/L   CO2 24  19 - 32 mEq/L   Glucose, Bld 108 (*) 70 - 99 mg/dL   BUN 14  6 - 23 mg/dL   Creatinine, Ser 5.16 (*) 0.50 - 1.35 mg/dL   Calcium 8.7  8.4 - 10.5 mg/dL   GFR calc non Af Amer 10 (*) >90 mL/min   GFR calc Af Amer 11 (*) >90 mL/min   Anion gap 15  5 - 15  PROTIME-INR      Result Value Ref Range   Prothrombin Time 16.9 (*) 11.6 - 15.2 seconds   INR 1.37  0.00 - 1.49  GLUCOSE, CAPILLARY      Result Value Ref Range   Glucose-Capillary 105 (*) 70 - 99 mg/dL  GLUCOSE, CAPILLARY      Result Value Ref Range   Glucose-Capillary 99  70 - 99 mg/dL   Comment 1 Notify RN     Comment 2 Documented in Chart    HEPATITIS B SURFACE ANTIGEN      Result Value Ref Range   Hepatitis B Surface Ag NEGATIVE  NEGATIVE  GLUCOSE, CAPILLARY      Result Value Ref Range   Glucose-Capillary 89  70 - 99 mg/dL   Comment 1 Notify RN     Comment 2 Documented in Chart    GLUCOSE, CAPILLARY      Result Value Ref Range   Glucose-Capillary 102 (*) 70 - 99 mg/dL   Comment 1 Notify RN    GLUCOSE, CAPILLARY      Result Value Ref Range   Glucose-Capillary 90  70 - 99 mg/dL   Comment 1 Notify RN     Comment 2 Documented in Chart    GLUCOSE, CAPILLARY      Result Value Ref Range   Glucose-Capillary 113 (*) 70 - 99 mg/dL   Comment 1 Notify RN      Comment 2 Documented in Chart  GLUCOSE, CAPILLARY      Result Value Ref Range   Glucose-Capillary 98  70 - 99 mg/dL   Comment 1 Notify RN     Comment 2 Documented in Chart    BASIC METABOLIC PANEL      Result Value Ref Range   Sodium 133 (*) 137 - 147 mEq/L   Potassium 4.0  3.7 - 5.3 mEq/L   Chloride 93 (*) 96 - 112 mEq/L   CO2 24  19 - 32 mEq/L   Glucose, Bld 91  70 - 99 mg/dL   BUN 16  6 - 23 mg/dL   Creatinine, Ser 4.77 (*) 0.50 - 1.35 mg/dL   Calcium 8.4  8.4 - 10.5 mg/dL   GFR calc non Af Amer 11 (*) >90 mL/min   GFR calc Af Amer 12 (*) >90 mL/min   Anion gap 16 (*) 5 - 15  CBC      Result Value Ref Range   WBC 9.5  4.0 - 10.5 K/uL   RBC 2.98 (*) 4.22 - 5.81 MIL/uL   Hemoglobin 8.1 (*) 13.0 - 17.0 g/dL   HCT 25.7 (*) 39.0 - 52.0 %   MCV 86.2  78.0 - 100.0 fL   MCH 27.2  26.0 - 34.0 pg   MCHC 31.5  30.0 - 36.0 g/dL   RDW 18.2 (*) 11.5 - 15.5 %   Platelets 291  150 - 400 K/uL  GLUCOSE, CAPILLARY      Result Value Ref Range   Glucose-Capillary 84  70 - 99 mg/dL  GLUCOSE, CAPILLARY      Result Value Ref Range   Glucose-Capillary 98  70 - 99 mg/dL   Comment 1 Notify RN    GLUCOSE, CAPILLARY      Result Value Ref Range   Glucose-Capillary 109 (*) 70 - 99 mg/dL  GLUCOSE, CAPILLARY      Result Value Ref Range   Glucose-Capillary 97  70 - 99 mg/dL  GLUCOSE, CAPILLARY      Result Value Ref Range   Glucose-Capillary 81  70 - 99 mg/dL  GLUCOSE, CAPILLARY      Result Value Ref Range   Glucose-Capillary 101 (*) 70 - 99 mg/dL  BASIC METABOLIC PANEL      Result Value Ref Range   Sodium 137  137 - 147 mEq/L   Potassium 4.1  3.7 - 5.3 mEq/L   Chloride 96  96 - 112 mEq/L   CO2 29  19 - 32 mEq/L   Glucose, Bld 153 (*) 70 - 99 mg/dL   BUN 8  6 - 23 mg/dL   Creatinine, Ser 2.72 (*) 0.50 - 1.35 mg/dL   Calcium 8.4  8.4 - 10.5 mg/dL   GFR calc non Af Amer 21 (*) >90 mL/min   GFR calc Af Amer 25 (*) >90 mL/min   Anion gap 12  5 - 15  GLUCOSE, CAPILLARY      Result Value  Ref Range   Glucose-Capillary 152 (*) 70 - 99 mg/dL  GLUCOSE, CAPILLARY      Result Value Ref Range   Glucose-Capillary 133 (*) 70 - 99 mg/dL  GLUCOSE, CAPILLARY      Result Value Ref Range   Glucose-Capillary 116 (*) 70 - 99 mg/dL  GLUCOSE, CAPILLARY      Result Value Ref Range   Glucose-Capillary 141 (*) 70 - 99 mg/dL  GLUCOSE, CAPILLARY      Result Value Ref Range   Glucose-Capillary 122 (*) 70 -  99 mg/dL  GLUCOSE, CAPILLARY      Result Value Ref Range   Glucose-Capillary 106 (*) 70 - 99 mg/dL   Comment 1 Orig Pt Id entered as [id number]    GLUCOSE, CAPILLARY      Result Value Ref Range   Glucose-Capillary 109 (*) 70 - 99 mg/dL  CBC      Result Value Ref Range   WBC 18.7 (*) 4.0 - 10.5 K/uL   RBC 3.10 (*) 4.22 - 5.81 MIL/uL   Hemoglobin 8.0 (*) 13.0 - 17.0 g/dL   HCT 26.3 (*) 39.0 - 52.0 %   MCV 84.8  78.0 - 100.0 fL   MCH 25.8 (*) 26.0 - 34.0 pg   MCHC 30.4  30.0 - 36.0 g/dL   RDW 18.5 (*) 11.5 - 15.5 %   Platelets 400  150 - 400 K/uL  RENAL FUNCTION PANEL      Result Value Ref Range   Sodium 137  137 - 147 mEq/L   Potassium 4.6  3.7 - 5.3 mEq/L   Chloride 97  96 - 112 mEq/L   CO2 25  19 - 32 mEq/L   Glucose, Bld 121 (*) 70 - 99 mg/dL   BUN 22  6 - 23 mg/dL   Creatinine, Ser 5.31 (*) 0.50 - 1.35 mg/dL   Calcium 8.3 (*) 8.4 - 10.5 mg/dL   Phosphorus 3.5  2.3 - 4.6 mg/dL   Albumin 1.4 (*) 3.5 - 5.2 g/dL   GFR calc non Af Amer 9 (*) >90 mL/min   GFR calc Af Amer 11 (*) >90 mL/min   Anion gap 15  5 - 15  GLUCOSE, CAPILLARY      Result Value Ref Range   Glucose-Capillary 105 (*) 70 - 99 mg/dL  GLUCOSE, CAPILLARY      Result Value Ref Range   Glucose-Capillary 144 (*) 70 - 99 mg/dL  CBC      Result Value Ref Range   WBC 20.9 (*) 4.0 - 10.5 K/uL   RBC 2.80 (*) 4.22 - 5.81 MIL/uL   Hemoglobin 7.4 (*) 13.0 - 17.0 g/dL   HCT 24.4 (*) 39.0 - 52.0 %   MCV 87.1  78.0 - 100.0 fL   MCH 26.4  26.0 - 34.0 pg   MCHC 30.3  30.0 - 36.0 g/dL   RDW 18.6 (*) 11.5 - 15.5 %    Platelets 402 (*) 150 - 400 K/uL  CK TOTAL AND CKMB      Result Value Ref Range   Total CK 157  7 - 232 U/L   CK, MB 3.2  0.3 - 4.0 ng/mL   Relative Index 2.0  0.0 - 2.5  TROPONIN I      Result Value Ref Range   Troponin I 1.48 (*) <0.30 ng/mL  TROPONIN I      Result Value Ref Range   Troponin I 2.58 (*) <0.30 ng/mL  CBC      Result Value Ref Range   WBC 21.1 (*) 4.0 - 10.5 K/uL   RBC 2.25 (*) 4.22 - 5.81 MIL/uL   Hemoglobin 6.0 (*) 13.0 - 17.0 g/dL   HCT 19.3 (*) 39.0 - 52.0 %   MCV 85.8  78.0 - 100.0 fL   MCH 26.7  26.0 - 34.0 pg   MCHC 31.1  30.0 - 36.0 g/dL   RDW 18.7 (*) 11.5 - 15.5 %   Platelets 350  150 - 400 K/uL  LACTIC ACID, PLASMA  Result Value Ref Range   Lactic Acid, Venous 1.1  0.5 - 2.2 mmol/L  GLUCOSE, CAPILLARY      Result Value Ref Range   Glucose-Capillary 110 (*) 70 - 99 mg/dL  GLUCOSE, CAPILLARY      Result Value Ref Range   Glucose-Capillary 129 (*) 70 - 99 mg/dL  I-STAT CG4 LACTIC ACID, ED      Result Value Ref Range   Lactic Acid, Venous 0.65  0.5 - 2.2 mmol/L  I-STAT ARTERIAL BLOOD GAS, ED      Result Value Ref Range   pH, Arterial 7.501 (*) 7.350 - 7.450   pCO2 arterial 41.1  35.0 - 45.0 mmHg   pO2, Arterial 82.0  80.0 - 100.0 mmHg   Bicarbonate 32.0 (*) 20.0 - 24.0 mEq/L   TCO2 33  0 - 100 mmol/L   O2 Saturation 97.0     Acid-Base Excess 8.0 (*) 0.0 - 2.0 mmol/L   Patient temperature 99.7 F     Collection site RADIAL, ALLEN'S TEST ACCEPTABLE     Drawn by RT     Sample type ARTERIAL    PREPARE RBC (CROSSMATCH)      Result Value Ref Range   Order Confirmation ORDER PROCESSED BY BLOOD BANK    TYPE AND SCREEN      Result Value Ref Range   ABO/RH(D) B POS     Antibody Screen NEG     Sample Expiration 02/06/2014     Unit Number [id number]     Blood Component Type RED CELLS,LR     Unit division 00     Status of Unit ISSUED     Transfusion Status OK TO TRANSFUSE     Crossmatch Result Compatible     Unit Number [id number]      Blood Component Type RED CELLS,LR     Unit division 00     Status of Unit ISSUED     Transfusion Status OK TO TRANSFUSE     Crossmatch Result Compatible     A/P: Pt with ESRD,CAD with EF 25%/ demand ischemia, anemia, DM, recent left AKA, septic/hemorrhagic shock, abd pain ,worsening leukocytosis and CT revealing acute cholecystitis. Pt is a poor surgical candidate. Tent plan is for percutaneous cholecystostomy on 8/26. Details/risks of procedure d/w pt's family with their understanding and consent. Will check am labs/hold SQ heparin and proceed on 8/26 if stable.

## 2014-02-03 NOTE — Progress Notes (Signed)
CRITICAL VALUE ALERT  Critical value received: Hemoglobin 6.0; Troponin 1.48  Date of notification:  02/03/14  Time of notification:  1150  Critical value read back:Yes.    Nurse who received alert:  Elliot Gurney  MD notified (1st page):  Montey Hora  Time of first page:  64  MD notified (2nd page):  Time of second page:  Responding MD:  Montey Hora  Time MD responded:  1150

## 2014-02-03 NOTE — Consult Note (Signed)
Reason for Consult: acute cholecystitis  Referring Physician: Dr. 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  He was discharged to a SNF only to return on the 8/18 with extensive necrotic left leg wound.  He was started on Vanc and Zosyn.  Dr. Oneida Alar was consulted and recommended a left AKA.  It took some time for the family to reach a decision to proceed with surgery which he underwent yesterday 8/24.   Post operatively he developed confusion, lethargy and hypotension which persisted despite IV fluid bolus.  He was transferred to the ICU.  He is now on levophed.  A CTH was negative.  A CT of abdomen and pelvis which showed a distended gallbladder, thickening and pericholecystic fluid, stones consistent with acute cholecystitis.  We have therefore been asked to evaluate the patient.  His white count is increasing, up to 21K today. Blood cultures dated 8/23 have been negative to date.  He has no recent LFTs or a lipase, the labs are pending.  The patient was unable to provide any history.  Some HPI was obtained from his daughter, however, this was limited as well. The patient's daughter, Sean [name], reports the patient has been complaining of intermittent abdominal pain associated with nausea, anorexia and weight loss over the last 1.5 months.  Denies vomiting.  Unable to tell the location of the pain, characteristics.  She reports worsening pain yesterday which they attributed to constipation.  He did not have any relief in symptoms after having a BM.  He remains afebrile, tachycardic, hypotension is now improved with Levophed.  He is currently NPO.    Past Medical History  Diagnosis Date  . Hyperparathyroidism   . Arthritis   . Hyperlipidemia   .  Hypertension   . Neuromuscular disorder   . Stroke   . GERD (gastroesophageal reflux disease)   . Migraine   . Heart attack 2011; 1990's  . Type II diabetes mellitus   . Peripheral neuropathy   . Anxiety   . Depression   . Dialysis patient     M, W, F; [place] ESRD (end stage renal disease) on dialysis   . CAD (coronary artery disease)     a. s/p NSTEMI and failed RCA PCI in 2011. b. Nuc 12/2013: nonischemic, prior inferior MI.  Marland Kitchen LBBB (left bundle branch block)   . Calciphylaxis     a. Multiple admissions for nonhealing L ulcer/sepsis, s/p L AKA 02/02/2014.  Marland Kitchen Chronic anemia   . Dementia     Past Surgical History  Procedure Laterality Date  . Colon surgery  2011    colon resection   . Parathyroidectomy  2011  . Capd insertion  09/12/2011    Procedure: LAPAROSCOPIC INSERTION CONTINUOUS AMBULATORY PERITONEAL DIALYSIS  (CAPD) CATHETER;  Surgeon: Edward Jolly, MD;  Location: [facility];  Service: General;  Laterality: N/A;  . Sp av dialysis shunt access existing *l*  2003  . Dg av dialysis shunt intro ndl*r* or      "not working" (09/19/11)  . Capd removal  03/12/2012    Procedure: CONTINUOUS AMBULATORY PERITONEAL DIALYSIS  (CAPD) CATHETER REMOVAL;  Surgeon: Edward Jolly, MD;  Location: [facility];  Service: General;  Laterality:  N/A;    Family History  Problem Relation Age of Onset  . Cancer Sister     breast  . Cancer Brother     lung  . Anesthesia problems Neg Hx   . Hypotension Neg Hx   . Malignant hyperthermia Neg Hx   . Pseudochol deficiency Neg Hx     Social History:  reports that he quit smoking about 37 years ago. His smoking use included Cigarettes. He has a 1.75 pack-year smoking history. He has never used smokeless tobacco. He reports that he does not drink alcohol or use illicit drugs.  Allergies:  Allergies  Allergen Reactions  . Benazepril Hcl Hives, Itching and Rash    flareups on patient head    Medications:  Scheduled Meds: . sodium  chloride   Intravenous Once  . darbepoetin (ARANESP) injection - DIALYSIS  150 mcg Intravenous Q Wed-HD  . ferric gluconate (FERRLECIT/NULECIT) IV  62.5 mg Intravenous Q Wed-HD  . heparin  5,000 Units Subcutaneous 3 times per day  . insulin aspart  0-9 Units Subcutaneous 6 times per day  . pantoprazole (PROTONIX) IV  40 mg Intravenous Q24H  . piperacillin-tazobactam (ZOSYN)  IV  2.25 g Intravenous 3 times per day  . sodium chloride  3 mL Intravenous Q12H  . [START ON 02/04/2014] vancomycin  1,000 mg Intravenous Q M,W,F-HD  . vancomycin  1,000 mg Intravenous Once   Continuous Infusions: . norepinephrine (LEVOPHED) Adult infusion 10 mcg/min (02/03/14 1130)   PRN Meds:.sodium chloride, acetaminophen, acetaminophen, albuterol, alteplase, fentaNYL, heparin, ipratropium-albuterol, lidocaine (PF), lidocaine-prilocaine, ondansetron (ZOFRAN) IV, ondansetron, pentafluoroprop-tetrafluoroeth   Results for orders placed during the hospital encounter of 01/27/14 (from the past 48 hour(s))  GLUCOSE, CAPILLARY     Status: Abnormal   Collection Time    02/01/14  4:36 PM      Result Value Ref Range   Glucose-Capillary 116 (*) 70 - 99 mg/dL  GLUCOSE, CAPILLARY     Status: Abnormal   Collection Time    02/01/14 10:24 PM      Result Value Ref Range   Glucose-Capillary 141 (*) 70 - 99 mg/dL  SURGICAL PCR SCREEN     Status: Abnormal   Collection Time    02/02/14  5:04 AM      Result Value Ref Range   MRSA, PCR NEGATIVE  NEGATIVE   Staphylococcus aureus POSITIVE (*) NEGATIVE   Comment:            The Xpert SA Assay (FDA     approved for NASAL specimens     in patients over 71 years of age),     is one component of     a comprehensive surveillance     program.  Test performance has     been validated by [employer] for patients greater     than or equal to 39 year old.     It is not intended     to diagnose infection nor to     guide or monitor treatment.  GLUCOSE, CAPILLARY     Status:  Abnormal   Collection Time    02/02/14  5:28 AM      Result Value Ref Range   Glucose-Capillary 122 (*) 70 - 99 mg/dL  GLUCOSE, CAPILLARY     Status: Abnormal   Collection Time    02/02/14 11:33 AM      Result Value Ref Range   Glucose-Capillary 109 (*) 70 - 99 mg/dL  GLUCOSE, CAPILLARY     Status: Abnormal   Collection Time    02/02/14  5:30 PM      Result Value Ref Range   Glucose-Capillary 105 (*) 70 - 99 mg/dL  GLUCOSE, CAPILLARY     Status: Abnormal   Collection Time    02/02/14  8:11 PM      Result Value Ref Range   Glucose-Capillary 144 (*) 70 - 99 mg/dL  CBC     Status: Abnormal   Collection Time    02/02/14 10:26 PM      Result Value Ref Range   WBC 18.7 (*) 4.0 - 10.5 K/uL   RBC 3.10 (*) 4.22 - 5.81 MIL/uL   Hemoglobin 8.0 (*) 13.0 - 17.0 g/dL   HCT 26.3 (*) 39.0 - 52.0 %   MCV 84.8  78.0 - 100.0 fL   MCH 25.8 (*) 26.0 - 34.0 pg   MCHC 30.4  30.0 - 36.0 g/dL   RDW 18.5 (*) 11.5 - 15.5 %   Platelets 400  150 - 400 K/uL  RENAL FUNCTION PANEL     Status: Abnormal   Collection Time    02/03/14  7:11 AM      Result Value Ref Range   Sodium 137  137 - 147 mEq/L   Potassium 4.6  3.7 - 5.3 mEq/L   Chloride 97  96 - 112 mEq/L   CO2 25  19 - 32 mEq/L   Glucose, Bld 121 (*) 70 - 99 mg/dL   BUN 22  6 - 23 mg/dL   Creatinine, Ser 5.31 (*) 0.50 - 1.35 mg/dL   Comment: DELTA CHECK NOTED   Calcium 8.3 (*) 8.4 - 10.5 mg/dL   Phosphorus 3.5  2.3 - 4.6 mg/dL   Albumin 1.4 (*) 3.5 - 5.2 g/dL   GFR calc non Af Amer 9 (*) >90 mL/min   GFR calc Af Amer 11 (*) >90 mL/min   Comment: (NOTE)     The eGFR has been calculated using the CKD EPI equation.     This calculation has not been validated in all clinical situations.     eGFR's persistently <90 mL/min signify possible Chronic Kidney     Disease.   Anion gap 15  5 - 15  CBC     Status: Abnormal   Collection Time    02/03/14  7:11 AM      Result Value Ref Range   WBC 20.9 (*) 4.0 - 10.5 K/uL   RBC 2.80 (*) 4.22 - 5.81  MIL/uL   Hemoglobin 7.4 (*) 13.0 - 17.0 g/dL   HCT 24.4 (*) 39.0 - 52.0 %   MCV 87.1  78.0 - 100.0 fL   MCH 26.4  26.0 - 34.0 pg   MCHC 30.3  30.0 - 36.0 g/dL   RDW 18.6 (*) 11.5 - 15.5 %   Platelets 402 (*) 150 - 400 K/uL  CK TOTAL AND CKMB     Status: None   Collection Time    02/03/14 10:30 AM      Result Value Ref Range   Total CK 157  7 - 232 U/L   CK, MB 3.2  0.3 - 4.0 ng/mL   Relative Index 2.0  0.0 - 2.5  TROPONIN I     Status: Abnormal   Collection Time    02/03/14 10:30 AM      Result Value Ref Range   Troponin I 1.48 (*) <0.30 ng/mL   Comment:  Due to the release kinetics of cTnI,     a negative result within the first hours     of the onset of symptoms does not rule out     myocardial infarction with certainty.     If myocardial infarction is still suspected,     repeat the test at appropriate intervals.     CRITICAL RESULT CALLED TO, READ BACK BY AND VERIFIED WITH:     HUNT K RN 02/03/14 1159 COSTELLO B  CBC     Status: Abnormal   Collection Time    02/03/14 10:43 AM      Result Value Ref Range   WBC 21.1 (*) 4.0 - 10.5 K/uL   RBC 2.25 (*) 4.22 - 5.81 MIL/uL   Hemoglobin 6.0 (*) 13.0 - 17.0 g/dL   Comment: REPEATED TO VERIFY     CRITICAL RESULT CALLED TO, READ BACK BY AND VERIFIED WITH:     KELLY HUNT,RN AT 1139 02/03/14 BY ZBEECH.   HCT 19.3 (*) 39.0 - 52.0 %   MCV 85.8  78.0 - 100.0 fL   MCH 26.7  26.0 - 34.0 pg   MCHC 31.1  30.0 - 36.0 g/dL   RDW 18.7 (*) 11.5 - 15.5 %   Platelets 350  150 - 400 K/uL  LACTIC ACID, PLASMA     Status: None   Collection Time    02/03/14 10:44 AM      Result Value Ref Range   Lactic Acid, Venous 1.1  0.5 - 2.2 mmol/L  GLUCOSE, CAPILLARY     Status: Abnormal   Collection Time    02/03/14 10:50 AM      Result Value Ref Range   Glucose-Capillary 110 (*) 70 - 99 mg/dL    Ct Head Wo Contrast  02/03/2014   CLINICAL DATA:  Mental status change.  EXAM: CT HEAD WITHOUT CONTRAST  TECHNIQUE: Contiguous axial images  were obtained from the base of the skull through the vertex without intravenous contrast.  COMPARISON:  CT 01/17/2014.  12/13/2013.  FINDINGS: No intra-axial or extra-axial pathologic fluid or blood collection. Left occipital encephalomalacia. White matter changes consistent with chronic ischemia. Cerebral vascular disease. Visualized orbits and paranasal sinuses are clear. Mastoids are clear. No acute bony abnormality.  IMPRESSION: 1. Old left occipital infarct. Chronic white matter ischemic change. Diffuse atrophy. 2. No acute abnormality.  Exam stable from prior exams.   Electronically Signed   By: Marcello Moores  Register   On: 02/03/2014 12:31   Ct Abdomen Pelvis W Contrast  02/03/2014   CLINICAL DATA:  Right upper quadrant abdominal pain. Possible colonic ischemia. Patient has a history of dialysis.  EXAM: CT ABDOMEN AND PELVIS WITH CONTRAST  TECHNIQUE: Multidetector CT imaging of the abdomen and pelvis was performed using the standard protocol following bolus administration of intravenous contrast.  CONTRAST:  169m OMNIPAQUE IOHEXOL 300 MG/ML  SOLN  COMPARISON:  12/09/2011.  FINDINGS: Gallbladder is distended with wall thickening and pericholecystic inflammation/edema. There are dependent small stones. Findings are consistent with acute cholecystitis given history.  Heart is mild to moderately enlarged. There is dependent subsegmental atelectasis.  Liver and spleen are unremarkable. Common bile duct is normal in caliber for age. No pancreatic masses or inflammatory change. No adrenal masses.  Diffuse bilateral renal cortical thinning. Multiple low-density renal masses consistent with cysts. No hydronephrosis. Normal ureters. Bladder is decompressed.  No pathologically enlarged lymph nodes.  No ascites.  There are atherosclerotic vascular calcifications throughout the aorta and its branch  vessels. Significant stenosis is noted throughout the superior mesenteric a arteries/celiac axis. The superior mesenteric  artery and celiac axis appear to originate from a common trunk from the aorta.  There are changes from previous right colon surgery. There is no bowel wall thickening or bowel wall air. No evidence of colonic or small bowel ischemia.  There is a small anterior abdominal wall fat containing hernia adjacent to a midline incision.  Bone show changes consistent with renal osteodystrophy. There are prominent subchondral cysts along both superior acetabula.  IMPRESSION: 1. Acute cholecystitis.  No evidence of ischemic bowel. 2. Multiple chronic findings as detailed. No other evidence of an acute abnormality.   Electronically Signed   By: Lajean Manes M.D.   On: 02/03/2014 12:35   Dg Chest Port 1 View  02/03/2014   CLINICAL DATA:  Assess airspace disease  EXAM: PORTABLE CHEST - 1 VIEW  COMPARISON:  01/27/2014  FINDINGS: Mild enlargement of the cardiac silhouette, stable. The aorta is uncoiled. No mediastinal or hilar masses.  Mild hazy left lung base opacity, most likely atelectasis. Infiltrate is possible. No pulmonary edema. No pleural effusion or pneumothorax.  Right central venous line has its tip near the caval atrial junction, well positioned.  IMPRESSION: Mild hazy opacity at the left lung base. This is likely atelectasis. A small pneumonia is possible. This is similar to opacity noted on the prior study, which supports atelectasis. Lungs otherwise clear.   Electronically Signed   By: Lajean Manes M.D.   On: 02/03/2014 12:54    Review of Systems  Unable to perform ROS  Blood pressure 98/51, pulse 107, temperature 97.6 F (36.4 C), temperature source Oral, resp. rate 28, height 5' 3"  (1.6 m), weight 180 lb 5.4 oz (81.8 kg), SpO2 100.00%. Physical Exam  Constitutional: He appears well-developed and well-nourished. He appears distressed.  Cardiovascular: Normal rate, regular rhythm, normal heart sounds and intact distal pulses.  Exam reveals no gallop and no friction rub.   No murmur  heard. Respiratory: Effort normal and breath sounds normal. No respiratory distress. He has no wheezes. He has no rales. He exhibits no tenderness.  GI: Soft. Bowel sounds are normal. He exhibits no distension.  RUQ and epigastric tenderness to palpation with voluntary guarding. Incisional reducible hernia noted.  Midline scar.  Musculoskeletal: He exhibits no edema.  Left AKA, right leg without edema  Neurological:  Alert and oriented to self  Skin: Skin is warm and dry.  Psychiatric:  Pleasantly confused    Assessment/Plan: ESRD HD MWF Ischemic CMP, sCHF w/ LVEF 25% Anemia Diabetes mellitus 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 Septic shock Acute cholecystitis   Will await LFTs and lipase.  Maintain NPO for now.  Agree with Zosyn.  Will discuss with Dr. Grandville Silos.  He would likely be a poor surgical candidate given his multiple co-morbidities.  His daughter is also against surgery now(POA is son Al).  We will likely proceed with a cholecystostomy tube.  Further recommendations to follow.  Thank you for the consult.   Najmah Carradine ANP-BC 02/03/2014, 1:57 PM

## 2014-02-03 NOTE — Consult Note (Signed)
PULMONARY / CRITICAL CARE MEDICINE   Name: Sean [name] MRN: [id number] DOB: [birth year]    ADMISSION DATE:  01/27/2014 CONSULTATION DATE:  02/03/2014  REFERRING MD :  Dillard Essex  CHIEF COMPLAINT:  AMS and Hypotension  INITIAL PRESENTATION: 78 y.o. M who was admitted 8/18 with infected wound of LLE and subsequently underwent left AKA on 8/24.  On evening of 8/24 began to have some hypotension and AMS.  Symptoms persisted into AM of 8/25.  SBP down into 60's and pt more altered, WBC up to 21; hence PCCM was consulted.  STUDIES:  8/25 CT Abd/Pelvis >>> acute cholecystitis, no stones, no ischemic bowel. 8/25 CT Head >>> old left occipital infarct, chronic white matter ischemic change.  No acute changes.  SIGNIFICANT EVENTS: 01/17/14 - admitted with sepsis 01/21/14 - discharged 01/27/14 - back to ED with sepsis again and infected LLE 8/24 - underwent left AKA 8/25 - transferred to ICU and started on pressors   HISTORY OF PRESENT ILLNESS:  Pt is encephalopathic; therefore, this HPI is obtained from chart review. Sean [name] is a 78 y.o. M with PMH as outlined below including ESRD (typically dialyzes M/W/F).  He resides in a SNF.  He was initially admitted on 01/17/14 with Sepsis in the setting of LLE infection of chronic non healing ulcers / calciphylactic wounds.  At that time, he was seen by wound care and treated with empiric abx.  He was discharged on 01/21/14. On 8/18, he was sent back to the ED because he was febrile and his left food had no pulses.  Vascular was consulted and they felt that Sean [name] needed a left AKA. Pt's dialysis schedule was adjusted as family decided on AKA.  He received last dialysis on Saturday 8/22. On Monday 8/24, he underwent left AKA (Dr. Oneida Alar).  Later that night, he began to have some hypotension and mild AMS.  Symptoms persisted throughout the night despite 1800cc of IVF's.  In AM of 8/25, SBP was down to 60's, pt was more altered, and he was febrile to 101.7.   WBC's were also slightly up from 19 to 21. PCCM was consulted and pt was transferred to the ICU for further evaluation.  PAST MEDICAL HISTORY :  Past Medical History  Diagnosis Date  . Hyperparathyroidism   . Arthritis   . Hyperlipidemia   . Hypertension   . Neuromuscular disorder   . Stroke   . GERD (gastroesophageal reflux disease)   . Headache(784.0)     occasional  migraine   . Heart attack 2011; 1990's  . Type II diabetes mellitus   . Peripheral neuropathy   . Anxiety   . Depression   . Dialysis patient     M, W, F; [place] (09/19/11)  . Chronic kidney disease     [place] MWF   Past Surgical History  Procedure Laterality Date  . Colon surgery  2011    colon resection   . Parathyroidectomy  2011  . Capd insertion  09/12/2011    Procedure: LAPAROSCOPIC INSERTION CONTINUOUS AMBULATORY PERITONEAL DIALYSIS  (CAPD) CATHETER;  Surgeon: Edward Jolly, MD;  Location: [facility];  Service: General;  Laterality: N/A;  . Sp av dialysis shunt access existing *l*  2003  . Dg av dialysis shunt intro ndl*r* or      "not working" (09/19/11)  . Capd removal  03/12/2012    Procedure: CONTINUOUS AMBULATORY PERITONEAL DIALYSIS  (CAPD) CATHETER REMOVAL;  Surgeon: Edward Jolly, MD;  Location: [facility]  OR;  Service: General;  Laterality: N/A;   Prior to Admission medications   Medication Sig Start Date End Date Taking? Authorizing Provider  acetaminophen (TYLENOL) 325 MG tablet Take 2 tablets (650 mg total) by mouth every 6 (six) hours as needed for mild pain, fever or headache. 12/24/13  Yes Debbe Odea, MD  albuterol (PROVENTIL) (2.5 MG/3ML) 0.083% nebulizer solution Take 3 mLs (2.5 mg total) by nebulization every 6 (six) hours as needed for wheezing or shortness of breath. 01/21/14  Yes Ripudeep Krystal Eaton, MD  aspirin 325 MG tablet Take 325 mg by mouth every evening.   Yes Historical Provider, MD  atorvastatin (LIPITOR) 20 MG tablet Take 20 mg by mouth at bedtime.   Yes Historical Provider,  MD  b complex-vitamin c-folic acid (NEPHRO-VITE) 0.8 MG TABS Take 0.8 mg by mouth at bedtime.   Yes Historical Provider, MD  carvedilol (COREG) 3.125 MG tablet Take 1 tablet (3.125 mg total) by mouth 2 (two) times daily with a meal. 12/16/13  Yes Debbe Odea, MD  collagenase (SANTYL) ointment Apply 1 application topically daily. 01/21/14  Yes Ripudeep Krystal Eaton, MD  HYDROcodone-acetaminophen (NORCO/VICODIN) 5-325 MG per tablet Take 1 tablet by mouth every 6 (six) hours as needed for moderate pain or severe pain. 01/22/14  Yes Tiffany L Reed, DO  hydrOXYzine (ATARAX/VISTARIL) 10 MG tablet Take 10 mg by mouth at bedtime as needed (for sleep).    Yes Historical Provider, MD  isosorbide-hydrALAZINE (BIDIL) 20-37.5 MG per tablet Take 1 tablet by mouth 2 (two) times daily. 12/16/13  Yes Debbe Odea, MD  levofloxacin (LEVAQUIN) 500 MG tablet Take 1 tablet (500 mg total) by mouth every other day. X 2 weeks 01/21/14  Yes Ripudeep Krystal Eaton, MD  PARoxetine (PAXIL) 20 MG tablet Take 1 tablet (20 mg total) by mouth daily. 01/21/14  Yes Ripudeep Krystal Eaton, MD  feeding supplement, RESOURCE BREEZE, (RESOURCE BREEZE) LIQD Take 1 Container by mouth 2 (two) times daily between meals. 01/21/14   Ripudeep Krystal Eaton, MD   Allergies  Allergen Reactions  . Benazepril Hcl Hives, Itching and Rash    flareups on patient head    FAMILY HISTORY:  Family History  Problem Relation Age of Onset  . Cancer Sister     breast  . Cancer Brother     lung  . Anesthesia problems Neg Hx   . Hypotension Neg Hx   . Malignant hyperthermia Neg Hx   . Pseudochol deficiency Neg Hx    SOCIAL HISTORY:  reports that he quit smoking about 37 years ago. His smoking use included Cigarettes. He has a 1.75 pack-year smoking history. He has never used smokeless tobacco. He reports that he does not drink alcohol or use illicit drugs.  REVIEW OF SYSTEMS:  Unable to complete as pt is encephalopathic.  SUBJECTIVE: SBP 70 manually.  Protecting airway at the  moment.  VITAL SIGNS: Temp:  [97.5 F (36.4 C)-101.7 F (38.7 C)] 101.7 F (38.7 C) (08/25 1004) Pulse Rate:  [15-110] 106 (08/25 1043) Resp:  [18-23] 21 (08/25 1043) BP: (65-98)/(37-56) 70/43 mmHg (08/25 0926) SpO2:  [97 %-100 %] 100 % (08/25 1043) Weight:  [81.8 kg (180 lb 5.4 oz)] 81.8 kg (180 lb 5.4 oz) (08/25 1043) HEMODYNAMICS:   VENTILATOR SETTINGS:   INTAKE / OUTPUT: Intake/Output     08/24 0701 - 08/25 0700 08/25 0701 - 08/26 0700   P.O. 240 0   I.V. (mL/kg) 300 (3.7)    IV Piggyback 50  Total Intake(mL/kg) 590 (7.2)    Net +590 0        Urine Occurrence     Stool Occurrence  1 x     PHYSICAL EXAMINATION: General: Chronically ill appearing male, moaning in bed Neuro: Not alert or oriented to person/place/time.  Does not answer questions or follow commands.  No focal deficits. Moaning and somewhat slurred speech. HEENT: [place]/AT. Right pupil dilated and irregular, anisocoria, sclerae anicteric. Cardiovascular: RRR, no M/R/G.  Lungs: Respirations even and unlabored.  CTA bilaterally, No W/R/R. Abdomen: BS x 4, soft.  Tenderness in RUQ, no guarding or rebound. Musculoskeletal: Left AKA, dressings C/D/I.  LUE graft. Skin: Intact, warm, no rashes.  LABS:  CBC  Recent Labs Lab 01/30/14 0720 02/02/14 2226 02/03/14 0711  WBC 9.5 18.7* 20.9*  HGB 8.1* 8.0* 7.4*  HCT 25.7* 26.3* 24.4*  PLT 291 400 402*   Coag's  Recent Labs Lab 01/28/14 0318  INR 1.37   BMET  Recent Labs Lab 01/30/14 0720 02/01/14 0722 02/03/14 0711  NA 133* 137 137  K 4.0 4.1 4.6  CL 93* 96 97  CO2 24 29 25   BUN 16 8 22   CREATININE 4.77* 2.72* 5.31*  GLUCOSE 91 153* 121*   Electrolytes  Recent Labs Lab 01/30/14 0720 02/01/14 0722 02/03/14 0711  CALCIUM 8.4 8.4 8.3*  PHOS  --   --  3.5   Sepsis Markers  Recent Labs Lab 01/27/14 2119  LATICACIDVEN 0.65   ABG  Recent Labs Lab 01/27/14 1942  PHART 7.501*  PCO2ART 41.1  PO2ART 82.0   Liver  Enzymes  Recent Labs Lab 01/27/14 2100 02/03/14 0711  AST 28  --   ALT 9  --   ALKPHOS 85  --   BILITOT 0.6  --   ALBUMIN 1.9* 1.4*   Cardiac Enzymes No results found for this basename: TROPONINI, PROBNP,  in the last 168 hours Glucose  Recent Labs Lab 02/01/14 1636 02/01/14 2224 02/02/14 0528 02/02/14 1133 02/02/14 1730 02/02/14 2011  GLUCAP 116* 141* 122* 109* 105* 144*    Imaging No results found.   ASSESSMENT / PLAN:  PULMONARY A: No acute issues Protecting airway Atelectasis P:   Supplemental O2 if needed to maintain SpO2 > 92%. Low threshold for intubation if pt becomes more altered and is unable to protect airway. DuoNebs / Albuterol. IS when able.  CARDIOVASCULAR R IJ PICC 8/20 >>> A:  Presumed septic shock - lactate reassuring. LBBB on EKG - chronic dating back to EKG from 12/21/2013 Troponin leak - 1.48 CAD Hx MI Hx HTN Hx HLD P:  Levophed. Goal MAP > 65. Trend troponin. Echo. Defer systemic heparin to cards. Cards consulted 8/25. Hold outpatient lipitor, coreg, bidil for now.  RENAL A:   ESRD - normally dialyzes M/W/F. P:   CMP in AM. Renal following.  GASTROINTESTINAL A:   Acute cholecystitis GERD Nutrition P:   NPO. TF if remains intubated NPO > 24 hours. Protonix. Check LFT's. Surgery consulted 8/25.  HEMATOLOGIC A:   Anemia VTE Prophylaxis P:  2 units PRBC. Follow H/H, goal Hgb > 7. SCD's / Heparin. CBC in AM.  INFECTIOUS A:   Presumed septic shock - unclear etiology at this point S/p left AKA 8/24 P:   BCx2 8/23 Abx: Vanc, start date 8/19, day 7/x Abx: Zosyn, start date 8/18, day 8/x PCT algorithm to limit abx exposure. Vascular following.  ENDOCRINE A:   DM II Hyperparathyroidism P:   CBG's q4hr. SSI.  NEUROLOGIC  A:   Acute Encephalopathy - Head CT negative.  Question whether primarily driven by hypotension as mental status is gradually improving as BP normalizes. Pain Old  CVA Anxiety Depression P:   D/c dilaudid, oxycodone. Use fentanyl PRN for pain control. Hold outpatient hydroxyzine, paxil for now.   TODAY'S SUMMARY: 78 y.o. M admitted with infected LLE, underwent AKA 8/24.  Later that evening became hypotensive and altered, got progressively worse into AM 8/25 and developed shock, presumed septic.  Transferred to ICU and pressors started.  On empiric abx for now.  CT abdomen showed acute cholecystitis, surgery consulted.  Anemic for which he is receiving 2 units PRBC.  Troponin elevated x 1, cards consulted.   Montey Hora, [place] Pgr: [phone]  or [phone] 02/03/2014, 10:45 AM  I have personally obtained a history, examined the patient, evaluated laboratory and imaging results, formulated the assessment and plan and placed orders.  CRITICAL CARE: The patient is critically ill with multiple organ systems failure and requires high complexity decision making for assessment and support, frequent evaluation and titration of therapies, application of advanced monitoring technologies and extensive interpretation of multiple databases. Critical Care Time devoted to patient care services described in this note is 45 minutes.    Rush Farmer, M.D. [facility] Pulmonary/Critical Care Medicine. Pager: [phone]. After hours pager: [phone].

## 2014-02-03 NOTE — Progress Notes (Signed)
OT Cancellation Note  Patient Details Name: Sean [name] MRN: [id number] DOB: [birth year]   Cancelled Treatment:    Reason Eval/Treat Not Completed: Medical issues which prohibited therapy. Spoke with nurse. Pt's BP 73/50.  Benito Mccreedy OTR/L [phone] 02/03/2014, 8:57 AM

## 2014-02-03 NOTE — Progress Notes (Addendum)
[facility] PROGRESS NOTE  Sean [name] OZH:[id number] DOB: [birth year] DOA: 01/27/2014 PCP: Philis Fendt, MD  Assessment/Plan:  1. Hypotension/sepsis -Patient with fevers, leukocytosis- 62.9 and systolic blood pressure in the 70s -IV fluid boluses but blood pressures still in the 70s in this dialysis patient>> will need transfer to ICU for possible pressors -Continue empiric antibiotics with vancomycin and Zosyn -I have consulted CCM  2.right upper quadrant tenderness  -Obtain CT of abdomen and pelvis, follow further recommendations pending results 3.Left leg calciphylalactic unstageable open wound between the knee and the ankle showing bone and muscles was absent to ounces of the dorsalis pedis and the posterior tibialis.   -Status post AKA on 8/24 per vascular -Continue IV antibiotics vancomycin and Zosyn  2. History of CAD with multiple MIs in the past - last nuclear test done on 7/5 showed no ischemia but an ejection fraction of 25%. -He is chest pain-free, continue outpatient medications  3. End-stage on hemodialysis followed by. center [place] surgery and is scheduled for Monday Wednesday and Friday  -Dialysis per renal -Renal adjusted patient dialysis schedule accordingly to accommodate his surgery  -Continue atarax prn for pruritis 4, advanced dementia -On no meds, follow  Code Status: full Family Communication: Daughter at bedside  Disposition Plan: Transfer to ICU   Consultants:  Vascular  Procedures:  None  Antibiotics:  Vancomycin and Zosyn started on 818  HPI/Subjective: per nursing staff hypotensive with systolic blood pressures in the 70s, moans but not verbally responsive today  Objective: Filed Vitals:   02/03/14 0926  BP: 70/43  Pulse:   Temp:   Resp:     Intake/Output Summary (Last 24 hours) at 02/03/14 0948 Last data filed at 02/02/14 1700  Gross per 24 hour  Intake    290 ml  Output      0 ml  Net    290 ml   Filed Weights    01/31/14 0731 01/31/14 1114 02/01/14 2144  Weight: 82.1 kg (181 lb) 81.6 kg (179 lb 14.3 oz) 81.8 kg (180 lb 5.4 oz)    Exam:  General: alert, in no respiratory distress, not answering any questions Cardiovascular: RRR, nl S1 s2 Respiratory: Decreased breath sounds at bases, no crackles or wheezes Abdomen: soft +BS RIGHT UPPER QUADRANT TENDERNESS PRESENT/ND, no masses palpable Extremities: Left AKA with dressing clean and dry, right lower extremity with no cyanosis and no edema    Data Reviewed: Basic Metabolic Panel:  Recent Labs Lab 01/27/14 2100 01/28/14 0318 01/30/14 0720 02/01/14 0722 02/03/14 0711  NA 135* 134* 133* 137 137  K 4.3 4.6 4.0 4.1 4.6  CL 94* 95* 93* 96 97  CO2 29 24 24 29 25   GLUCOSE 117* 108* 91 153* 121*  BUN 13 14 16 8 22   CREATININE 4.80* 5.16* 4.77* 2.72* 5.31*  CALCIUM 8.7 8.7 8.4 8.4 8.3*  PHOS  --   --   --   --  3.5   Liver Function Tests:  Recent Labs Lab 01/27/14 2100 02/03/14 0711  AST 28  --   ALT 9  --   ALKPHOS 85  --   BILITOT 0.6  --   PROT 7.3  --   ALBUMIN 1.9* 1.4*   No results found for this basename: LIPASE, AMYLASE,  in the last 168 hours No results found for this basename: AMMONIA,  in the last 168 hours CBC:  Recent Labs Lab 01/27/14 2100 01/28/14 0318 01/30/14 0720 02/02/14 2226 02/03/14 0711  WBC 10.9* 10.5  9.5 18.7* 20.9*  NEUTROABS 8.1*  --   --   --   --   HGB 8.5* 8.8* 8.1* 8.0* 7.4*  HCT 27.4* 28.5* 25.7* 26.3* 24.4*  MCV 86.2 85.6 86.2 84.8 87.1  PLT 295 316 291 400 402*   Cardiac Enzymes: No results found for this basename: CKTOTAL, CKMB, CKMBINDEX, TROPONINI,  in the last 168 hours BNP (last 3 results)  Recent Labs  12/13/13 1408 01/17/14 0800  PROBNP >70000.0* >70000.0*   CBG:  Recent Labs Lab 02/01/14 2224 02/02/14 0528 02/02/14 1133 02/02/14 1730 02/02/14 2011  GLUCAP 141* 122* 109* 105* 144*    Recent Results (from the past 240 hour(s))  CULTURE, BLOOD (ROUTINE X 2)      Status: None   Collection Time    01/31/14  4:28 PM      Result Value Ref Range Status   Specimen Description BLOOD RIGHT HAND   Final   Special Requests BOTTLES DRAWN AEROBIC AND ANAEROBIC 10 CC   Final   Culture  Setup Time     Final   Value: 02/01/2014 00:09     Performed at [facility]   Culture     Final   Value:        BLOOD CULTURE RECEIVED NO GROWTH TO DATE CULTURE WILL BE HELD FOR 5 DAYS BEFORE ISSUING A FINAL NEGATIVE REPORT     Performed at [facility]   Report Status PENDING   Incomplete  CULTURE, BLOOD (ROUTINE X 2)     Status: None   Collection Time    02/01/14  7:22 AM      Result Value Ref Range Status   Specimen Description BLOOD RIGHT HAND   Final   Special Requests BOTTLES DRAWN AEROBIC AND ANAEROBIC 5CC EACH   Final   Culture  Setup Time     Final   Value: 02/01/2014 17:45     Performed at [facility]   Culture     Final   Value:        BLOOD CULTURE RECEIVED NO GROWTH TO DATE CULTURE WILL BE HELD FOR 5 DAYS BEFORE ISSUING A FINAL NEGATIVE REPORT     Performed at [facility]   Report Status PENDING   Incomplete  SURGICAL PCR SCREEN     Status: Abnormal   Collection Time    02/02/14  5:04 AM      Result Value Ref Range Status   MRSA, PCR NEGATIVE  NEGATIVE Final   Staphylococcus aureus POSITIVE (*) NEGATIVE Final   Comment:            The Xpert SA Assay (FDA     approved for NASAL specimens     in patients over 25 years of age),     is one component of     a comprehensive surveillance     program.  Test performance has     been validated by [employer] for patients greater     than or equal to 30 year old.     It is not intended     to diagnose infection nor to     guide or monitor treatment.     Studies: No results found.  Scheduled Meds: . atorvastatin  20 mg Oral QHS  . Chlorhexidine Gluconate Cloth  6 each Topical Daily  . darbepoetin (ARANESP) injection - DIALYSIS  150 mcg Intravenous Q Wed-HD  .  docusate sodium  100  mg Oral Daily  . feeding supplement (RESOURCE BREEZE)  1 Container Oral BID BM  . ferric gluconate (FERRLECIT/NULECIT) IV  62.5 mg Intravenous Q Wed-HD  . heparin  5,000 Units Subcutaneous 3 times per day  . methocarbamol (ROBAXIN) IV  500 mg Intravenous Once  . mupirocin ointment  1 application Nasal BID  . pantoprazole  40 mg Oral Daily  . PARoxetine  20 mg Oral Daily  . piperacillin-tazobactam (ZOSYN)  IV  2.25 g Intravenous 3 times per day  . sodium chloride  500 mL Intravenous Once  . sodium chloride  3 mL Intravenous Q12H  . [START ON 02/04/2014] vancomycin  1,000 mg Intravenous Q M,W,F-HD  . vancomycin  1,000 mg Intravenous Once   Continuous Infusions:   Principal Problem:   Open leg wound    Time spent: [place] [facility] Pager [phone]. If 7PM-7AM, please contact night-coverage at [email or web address], password [facility] 02/03/2014, 9:48 AM  LOS: 7 days

## 2014-02-03 NOTE — Progress Notes (Addendum)
[facility] Progress Note  02/03/2014 8:42 AM POD 1  Subjective:  Patient is confused this morning.  Filed Vitals:   02/03/14 0838  BP: 73/50  Pulse:   Temp:   Resp:     Physical Exam: Left AKA dressing clean.   CBC    Component Value Date/Time   WBC 20.9* 02/03/2014 0711   RBC 2.80* 02/03/2014 0711   RBC 3.67* 09/19/2011 1356   HGB 7.4* 02/03/2014 0711   HCT 24.4* 02/03/2014 0711   PLT 402* 02/03/2014 0711   MCV 87.1 02/03/2014 0711   MCH 26.4 02/03/2014 0711   MCHC 30.3 02/03/2014 0711   RDW 18.6* 02/03/2014 0711   LYMPHSABS 0.9 01/27/2014 2100   MONOABS 1.8* 01/27/2014 2100   EOSABS 0.1 01/27/2014 2100   BASOSABS 0.0 01/27/2014 2100    BMET    Component Value Date/Time   NA 137 02/03/2014 0711   K 4.6 02/03/2014 0711   CL 97 02/03/2014 0711   CO2 25 02/03/2014 0711   GLUCOSE 121* 02/03/2014 0711   BUN 22 02/03/2014 0711   CREATININE 5.31* 02/03/2014 0711   CALCIUM 8.3* 02/03/2014 0711   GFRNONAA 9* 02/03/2014 0711   GFRAA 11* 02/03/2014 0711    INR    Component Value Date/Time   INR 1.37 01/28/2014 0318     Intake/Output Summary (Last 24 hours) at 02/03/14 0842 Last data filed at 02/02/14 1700  Gross per 24 hour  Intake    590 ml  Output      0 ml  Net    590 ml     Assessment/Plan:  78 y.o. male is s/p left above knee amputation  POD 1  -No drainage seen on dressing. -Will take down dressing tomorrow.  -CT scan was ordered this morning to evaluate for acute mental status change.   Virgina Jock, PA-C [facility] Office: [phone] Pager: [phone] 02/03/2014 8:42 AM  Pt with some right upper quadrant pain.  Confused does not open eyes.  AKA dressing intact no obvious hematoma.  Apparently hypotensive overnight.  Agree with head CT.  Will also get abdominal CT as hypotension puts him at risk of ischemic colitis.  Anemia some chronic some blood loss.  Transfuse PRN per primary team.  Severe protein calorie  malnutrition.  May need feeding tube if mental status not improved enough soon for PO intatke.  Need to rule out MI.  Ruta Hinds, MD [facility] [place] Office: [phone] Pager: [phone]   Filed Vitals:   02/03/14 [phone] 02/03/14 0900 02/03/14 0926 02/03/14 1004  BP: 73/50 79/45 70/43    Pulse:    105  Temp:    101.7 F (38.7 C)  TempSrc:    Rectal  Resp:    23  Height:      Weight:      SpO2:    99%   CBC    Component Value Date/Time   WBC 20.9* 02/03/2014 0711   RBC 2.80* 02/03/2014 0711   RBC 3.67* 09/19/2011 1356   HGB 7.4* 02/03/2014 0711   HCT 24.4* 02/03/2014 0711   PLT 402* 02/03/2014 0711   MCV 87.1 02/03/2014 0711   MCH 26.4 02/03/2014 0711   MCHC 30.3 02/03/2014 0711   RDW 18.6* 02/03/2014 0711   LYMPHSABS 0.9 01/27/2014 2100   MONOABS 1.8* 01/27/2014 2100   EOSABS 0.1 01/27/2014 2100   BASOSABS 0.0 01/27/2014 2100    BMET    Component Value Date/Time  NA 137 02/03/2014 0711   K 4.6 02/03/2014 0711   CL 97 02/03/2014 0711   CO2 25 02/03/2014 0711   GLUCOSE 121* 02/03/2014 0711   BUN 22 02/03/2014 0711   CREATININE 5.31* 02/03/2014 0711   CALCIUM 8.3* 02/03/2014 0711   GFRNONAA 9* 02/03/2014 0711   GFRAA 11* 02/03/2014 1031

## 2014-02-03 NOTE — Consult Note (Signed)
In light of medical condition and family wishes will ask IR to place perc chole tube. Continue IV ABX. I spoke with his niece at the bedside. Patient examined and I agree with the assessment and plan  Georganna Skeans, MD, MPH, FACS Trauma: [phone] General Surgery: [phone]  02/03/2014 5:07 PM

## 2014-02-03 NOTE — Significant Event (Addendum)
Rapid Response Event Note  Overview:  Called by RN to alert that patient had SDU orders.  Reviewed chart - elected to go see patient.  Time Called: 0938 Arrival Time: 0941 Event Type: Neurologic;Hypotension  Initial Focused Assessment:  On arrival patient supine in bed - arouses to name but no coherent speech. Will make eye contact but mostly moans.  Hot to touch.  Resps slightly rapid - no distress.  On 3 liter nasal cannula.  Bil BS clear. Very tender to touch RUQ - inconsistent exam with decreased LOC but more often than not grimaces and cries out with palp RUQ.  Abd soft.  Left AKA DDI.  No JVD noted  - no pedal edema.  Anuric renal patient.  HR 109 ST.  Manual BP left arm 70/48.  In chart review 1750 cc NS bolus over last 12 hours - BP remains in the 91/ systolic range.  Rectal temp 101.7. CBG 110. WBC increasing.  Moderate amount soft brown stool. 2 port power PICC patent left subclavian area.  Site ok.   Daughter present in room - states her Dad was awake and alert yesterday - some baseline confusion with date and time but otherwise appropriate in the moment.  Moves everything - purposeful. Patient admitted with infected left leg - had left AKA yesterday.     Interventions:  Continue with NS fluid bolus.  Dr. Inis Sizer present - case discussed.  PCCM consulted.  Rahul Shearon Stalls present - manual BP 68/42 HR 108 RR 26 O2 sats 98%.  Orders for Levophed - pharmacy contacted - transfer to ICU- bed available now - report given by [facility] RN to [facility] RN.  Transferred to 2M16 without incident.  Handoff to [facility].  Daughter updated.  BP 82/48  Levophed started per order.    Event Summary: Name of Physician Notified: Dr. Lurline Idol at  (pta RRT)  Name of Consulting Physician Notified: Dr. Carlyon Prows [facility] at [phone]  Outcome: Transferred (Comment)  Event End Time: [address]

## 2014-02-03 NOTE — Progress Notes (Signed)
  Echocardiogram 2D Echocardiogram has been performed.  Sean [name] 02/03/2014, 3:27 PM

## 2014-02-03 NOTE — Progress Notes (Addendum)
Subjective:  Confused since last night, according to daughter, unable to respond verbally, but occasionally cries out; not eating or drinking  Objective: Vital signs in last 24 hours: Temp:  [97.5 F (36.4 C)-99.2 F (37.3 C)] 97.5 F (36.4 C) (08/25 0600) Pulse Rate:  [15-110] 15 (08/25 0600) Resp:  [16-23] 22 (08/25 0600) BP: (65-98)/(37-56) 77/49 mmHg (08/25 0600) SpO2:  [97 %-100 %] 97 % (08/25 0600) Weight change:   Intake/Output from previous day: 08/24 0701 - 08/25 0700 In: 53 [P.O.:240; I.V.:300; IV Piggyback:50] Out: -    Lab Results:  Recent Labs  02/02/14 2226 02/03/14 0711  WBC 18.7* 20.9*  HGB 8.0* 7.4*  HCT 26.3* 24.4*  PLT 400 402*   BMET:  Recent Labs  02/01/14 0722  NA 137  K 4.1  CL 96  CO2 29  GLUCOSE 153*  BUN 8  CREATININE 2.72*  CALCIUM 8.4   No results found for this basename: PTH,  in the last 72 hours Iron Studies: No results found for this basename: IRON, TIBC, TRANSFERRIN, FERRITIN,  in the last 72 hours  Studies/Results: No results found.  EXAM:  General appearance:  Confused, in moderate distress Resp: CTA without rales, rhonchi, or wheezes  Cardio: RRR without murmur or rub  GI: + BS, soft and nontender  Extremities: No edema, dressing on L AKA  Access: AVG @ LUA with + bruit   Dialysis Orders: Center: [facility] on MWF .  EDW 82kg HD Bath 4.0k, 2.25ca Time 4.o hr Heparin 6,000. Access LUA AVG BFR 450 DFR 1.5  0 Vit D HD Arasnep 123mcg qwed Units IV/HD Venofer 100 mg q weekly hd  OP Lab ipth 647 12/31/13  Assessment/Plan:  1. Necrotic L lower extremity calciphylactic wounds - s/p L AKA per Dr. Oneida Alar yesterday, on Vancomycin & Zosyn, stable. 2. AMS - since last night, low BPs, possibly sec to pain meds, WBCs up to 20.9, Hx CVA. 3. ESRD - HD on MWF @ [place], K 4.1. HD postponed sec to hypotension. 4. HTN/Volume - BP 77/49, previously on Carvedilol 3.125 mg bid, Bidil 20-37.5 mg bid, both on hold; needs new EDW after  amputation. 5. Anemia - Hgb down to 7.4, Aranesp 150 mcg & Fe on Wed. 6. Sec HPT - Ca 9 (10.9 corrected), P 3.5; no Vitamin D or binders. 7. Nutrition - Alb 1.4, diet as tolerated, multivitamin. 8. DM - per primary.   LOS: 7 days   LYLES,CHARLES 02/03/2014,8:02 AM   I have seen and examined this patient and agree with plan as outlined by C. Lyles, PA-C.  Mr. Tarnowski now with SIRS and ABLA.  He has been transferred to the ICU for pressors and w/u for AMS and hypotension underway.  Plan for blood transfusion today and will hold off on HD until he is more hemodynamically stable.  CT of head without new CVA but CT of abd c/w acute cholecystitis which may explain his fevers and SIRS.  Plan per PCCM. Loreda Silverio A,MD 02/03/2014 12:42 PM

## 2014-02-03 NOTE — Care Management Note (Addendum)
  Page 2 of 2   02/09/2014     4:32:26 PM CARE MANAGEMENT NOTE 02/09/2014  Patient:  Sean [name], Sean [name]   Account Number:  [id number]  Date Initiated:  01/28/2014  Documentation initiated by:  Marvetta Gibbons  Subjective/Objective Assessment:   Pt admitted with fever, open leg wound-infected     Action/Plan:   PTA pt was at [facility] consulted   Anticipated DC Date:     Anticipated DC Plan:  [place] referral  Clinical Social Worker      DC Forensic scientist  CM consult      Choice offered to / List presented to:             Status of service:  Completed, signed off Medicare Important Message given?  YES (If response is "NO", the following Medicare IM given date fields will be blank) Date Medicare IM given:  02/03/2014 Medicare IM given by:  Elissa Hefty Date Additional Medicare IM given:  02/09/2014 Additional Medicare IM given by:  Crystalle Popwell  Discharge Disposition:  [place]  Per UR Regulation:  Reviewed for med. necessity/level of care/duration of stay  If discussed at [place] of Stay Meetings, dates discussed:   02/03/2014  02/05/2014    Comments:  Donella Stade Matsuko Kretz RN, BSN, MSHL, CCM  Nurse - Case Manager,  ([facility])  [phone]  02/09/2014 Disposition Plan:  SNF and HD M-W-F    Daughter- Yamen Castrogiovanni- [phone]  01/29/14- 101- Marvetta Gibbons RN, BSN [phone] Spoke with pt's daughter Chinita regarding plans for discharge- pt was at [facility] however daughter states that she does not wish for him to return there and plans to go pick up pts belongings and inform them that she will not be holding a bed- daughter has gone to [facility] SNF and likes it- and wants to look at placement there as first choice- she also stated that MD had mentioned CIR here prior to discharge to SNF- explained to daughter that discharge to CIR was a process of referral- and that post op we would have to have an MD  order for CIR to come to an assessment of her dad to see if he would be a good candidate for CIR and meet their criteria for admission- explained that if he did not then we could proceed with placement in a SNF such as [facility]- will have CSW f/u with daughter regarding placement needs- pt for OR on Monday for amputation.

## 2014-02-04 ENCOUNTER — Inpatient Hospital Stay (HOSPITAL_COMMUNITY): Payer: Medicare Other

## 2014-02-04 DIAGNOSIS — K819 Cholecystitis, unspecified: Secondary | ICD-10-CM

## 2014-02-04 DIAGNOSIS — I214 Non-ST elevation (NSTEMI) myocardial infarction: Secondary | ICD-10-CM

## 2014-02-04 DIAGNOSIS — R6521 Severe sepsis with septic shock: Secondary | ICD-10-CM

## 2014-02-04 DIAGNOSIS — A419 Sepsis, unspecified organism: Secondary | ICD-10-CM

## 2014-02-04 DIAGNOSIS — E440 Moderate protein-calorie malnutrition: Secondary | ICD-10-CM | POA: Insufficient documentation

## 2014-02-04 DIAGNOSIS — D62 Acute posthemorrhagic anemia: Secondary | ICD-10-CM

## 2014-02-04 LAB — PROTIME-INR
INR: 1.87 — AB (ref 0.00–1.49)
INR: 1.86 — AB (ref 0.00–1.49)
Prothrombin Time: 21.5 seconds — ABNORMAL HIGH (ref 11.6–15.2)
Prothrombin Time: 21.4 seconds — ABNORMAL HIGH (ref 11.6–15.2)

## 2014-02-04 LAB — TYPE AND SCREEN
ABO/RH(D): B POS
ANTIBODY SCREEN: NEGATIVE
UNIT DIVISION: 0
Unit division: 0

## 2014-02-04 LAB — COMPREHENSIVE METABOLIC PANEL
ALT: <5 U/L (ref 0–53)
AST: 36 U/L (ref 0–37)
Albumin: 1.3 g/dL — ABNORMAL LOW (ref 3.5–5.2)
Alkaline Phosphatase: 88 U/L (ref 39–117)
Anion gap: 16 — ABNORMAL HIGH (ref 5–15)
BUN: 27 mg/dL — AB (ref 6–23)
CALCIUM: 8.4 mg/dL (ref 8.4–10.5)
CO2: 23 mEq/L (ref 19–32)
CREATININE: 6.02 mg/dL — AB (ref 0.50–1.35)
Chloride: 98 mEq/L (ref 96–112)
GFR calc non Af Amer: 8 mL/min — ABNORMAL LOW (ref >90)
GFR, EST AFRICAN AMERICAN: 9 mL/min — AB (ref >90)
GLUCOSE: 111 mg/dL — AB (ref 70–99)
Potassium: 4 mEq/L (ref 3.7–5.3)
Sodium: 137 mEq/L (ref 137–147)
TOTAL PROTEIN: 6.3 g/dL (ref 6.0–8.3)
Total Bilirubin: 0.7 mg/dL (ref 0.3–1.2)

## 2014-02-04 LAB — CBC
HCT: 27.7 % — ABNORMAL LOW (ref 39.0–52.0)
HEMOGLOBIN: 8.9 g/dL — AB (ref 13.0–17.0)
MCH: 27.1 pg (ref 26.0–34.0)
MCHC: 32.1 g/dL (ref 30.0–36.0)
MCV: 84.2 fL (ref 78.0–100.0)
Platelets: 384 10*3/uL (ref 150–400)
RBC: 3.29 MIL/uL — AB (ref 4.22–5.81)
RDW: 17.6 % — ABNORMAL HIGH (ref 11.5–15.5)
WBC: 23.2 10*3/uL — ABNORMAL HIGH (ref 4.0–10.5)

## 2014-02-04 LAB — MAGNESIUM: Magnesium: 1.9 mg/dL (ref 1.5–2.5)

## 2014-02-04 LAB — GLUCOSE, CAPILLARY
GLUCOSE-CAPILLARY: 105 mg/dL — AB (ref 70–99)
GLUCOSE-CAPILLARY: 95 mg/dL (ref 70–99)
GLUCOSE-CAPILLARY: 111 mg/dL — AB (ref 70–99)
Glucose-Capillary: 107 mg/dL — ABNORMAL HIGH (ref 70–99)
Glucose-Capillary: 99 mg/dL (ref 70–99)

## 2014-02-04 LAB — PROCALCITONIN: Procalcitonin: 3.89 ng/mL

## 2014-02-04 LAB — VANCOMYCIN, RANDOM: Vancomycin Rm: 26.3 ug/mL

## 2014-02-04 LAB — APTT: aPTT: 57 seconds — ABNORMAL HIGH (ref 24–37)

## 2014-02-04 LAB — PHOSPHORUS: PHOSPHORUS: 4 mg/dL (ref 2.3–4.6)

## 2014-02-04 MED ORDER — SODIUM CHLORIDE 0.9 % IJ SOLN
10.0000 mL | Freq: Three times a day (TID) | INTRAMUSCULAR | Status: DC
  Administered 2014-02-04 – 2014-02-09 (×10): 10 mL

## 2014-02-04 MED ORDER — VANCOMYCIN HCL IN DEXTROSE 750-5 MG/150ML-% IV SOLN
750.0000 mg | INTRAVENOUS | Status: DC
  Administered 2014-02-04: 750 mg via INTRAVENOUS
  Filled 2014-02-04: qty 150

## 2014-02-04 MED ORDER — LIDOCAINE HCL 1 % IJ SOLN
INTRAMUSCULAR | Status: AC
Start: 2014-02-04 — End: 2014-02-05
  Filled 2014-02-04: qty 20

## 2014-02-04 MED ORDER — FENTANYL CITRATE 0.05 MG/ML IJ SOLN
INTRAMUSCULAR | Status: AC | PRN
  Administered 2014-02-04: 25 ug via INTRAVENOUS

## 2014-02-04 MED ORDER — MIDAZOLAM HCL 2 MG/2ML IJ SOLN
INTRAMUSCULAR | Status: AC
Start: 2014-02-04 — End: 2014-02-05
  Filled 2014-02-04: qty 2

## 2014-02-04 MED ORDER — IOHEXOL 300 MG/ML  SOLN
50.0000 mL | Freq: Once | INTRAMUSCULAR | Status: AC | PRN
  Administered 2014-02-04: 10 mL

## 2014-02-04 MED ORDER — SODIUM CHLORIDE 0.9 % IV SOLN
INTRAVENOUS | Status: DC
  Administered 2014-02-05 – 2014-02-09 (×4): via INTRAVENOUS

## 2014-02-04 MED ORDER — LIDOCAINE HCL 1 % IJ SOLN
INTRAMUSCULAR | Status: AC
  Filled 2014-02-04: qty 20

## 2014-02-04 MED ORDER — MIDAZOLAM HCL 2 MG/2ML IJ SOLN
INTRAMUSCULAR | Status: AC | PRN
  Administered 2014-02-04: 1 mg via INTRAVENOUS

## 2014-02-04 MED ORDER — FENTANYL CITRATE 0.05 MG/ML IJ SOLN
INTRAMUSCULAR | Status: AC
  Filled 2014-02-04: qty 2

## 2014-02-04 NOTE — Progress Notes (Signed)
Perc chole tube today. I spoke with his family. Patient examined and I agree with the assessment and plan  Georganna Skeans, MD, MPH, FACS Trauma: [phone] General Surgery: [phone]  02/04/2014 9:55 AM

## 2014-02-04 NOTE — Progress Notes (Signed)
Pt more alert today but still confused.  CT abdomen shows cholecystitis  Filed Vitals:   02/04/14 0545 02/04/14 0600 02/04/14 0615 02/04/14 0630  BP: 99/31 76/45 98/50  102/52  Pulse: 99 99 99 99  Temp:      TempSrc:      Resp: 21 20 21 21   Height:      Weight:    183 lb 13.8 oz (83.4 kg)  SpO2: 96% 95% 95% 94%   On Levophed  Left lower extremity: staple line intact, no drainage no hematoma   CBC    Component Value Date/Time   WBC 23.2* 02/04/2014 0350   RBC 3.29* 02/04/2014 0350   RBC 3.67* 09/19/2011 1356   HGB 8.9* 02/04/2014 0350   HCT 27.7* 02/04/2014 0350   PLT 384 02/04/2014 0350   MCV 84.2 02/04/2014 0350   MCH 27.1 02/04/2014 0350   MCHC 32.1 02/04/2014 0350   RDW 17.6* 02/04/2014 0350   LYMPHSABS 0.9 01/27/2014 2100   MONOABS 1.8* 01/27/2014 2100   EOSABS 0.1 01/27/2014 2100   BASOSABS 0.0 01/27/2014 2100    A: POD 2 from left AKA, now septic with cholecystitis scheduled for perc drainage today.  AKA healing so far.  Pt with severe protein calorie malnutrition and needs nutritional support to heal AKA  P: Daily dressing change left AKA.  Dry dressing with ACE wrap.  Nutritional support in the form of PO intake or tube feeds if unable to sustain himself otherwise at very high risk for wound breakdown.    Ruta Hinds, MD [facility] Office: [phone] Pager: [phone]

## 2014-02-04 NOTE — Progress Notes (Signed)
    Consult note reviewed.  Case discussed with Dr. Alva Garnet -- Troponin elevation in setting with known prior Inferior Infarct with Septic shock & anemia from Acute Cholecystitis.    Very difficult to distinguish Angina from Cholic pain.  I do agree that the + Troponin is most likely Type 2 MI from supply vs. Demand ischemia.  Would not anticoagulate.  With hypotension, cardiac meds such as BB/ ACE-I/Nitrate etc still not indicated.  Avoid volume overload.  If plans are to proceed with Cholecystectomy - would probably need ischemic evaluation (& with recent [place], the next step would be cath) -- however, would prefer to avoid this until he is stabilized & no longer Septic (post Perc. Chole Tube).   If no angina post procedure, we may simply continue with expectant medical management & avoid cath.  We will monitor & be available for assistance if needed.  Will see PRN unless condition worsens.     Leonie Man, M.D., M.S. Interventional Cardiologist   Pager # [phone] 02/04/2014

## 2014-02-04 NOTE — Progress Notes (Signed)
OT Cancellation Note  Patient Details Name: Sean [name] MRN: [id number] DOB: Jun 04, 1936   Cancelled Treatment:    Reason Eval/Treat Not Completed: Patient at procedure or test/ unavailable - will reattempt.  Darlina Rumpf [place], OTR/L [phone]  02/04/2014, 11:45 AM

## 2014-02-04 NOTE — Progress Notes (Addendum)
PT Cancellation Note  Patient Details Name: JAC ROMULUS MRN: [id number] DOB: Feb 17, 1936   Cancelled Treatment:    Reason Eval/Treat Not Completed: Medical issues which prohibited therapy (On dialysis and to go to IR for procedure. )  Will check back either late pm or in the am.   INGOLD,[place] 02/04/2014, 9:22 AM [facility] [phone] (pager)

## 2014-02-04 NOTE — Progress Notes (Signed)
Patient ID: Sean [name], male   DOB: 1935/09/22, 78 y.o.   MRN: [id number]   Pt is scheduled tentatively for percutaneous cholecystostomy drain placement today. INR this am 1.87 Repeated lab - but was same Pt not on anticoagulation No hx anticoagulation  Discussed with Dr Georganna Skeans Would like IR to move forward with procedure today if can He feels pt is very sick and may become septic if hold off.  Message relayed to Dr Vernard Gambles

## 2014-02-04 NOTE — Progress Notes (Signed)
ANTIBIOTIC CONSULT NOTE - FOLLOW UP  Pharmacy Consult for Vancomycin and Zosyn Indication: sepsis, wound infection  Allergies  Allergen Reactions  . Benazepril Hcl Hives, Itching and Rash    flareups on patient head    Patient Measurements: Height: 5\' 3"  (160 cm) Weight: 183 lb 13.8 oz (83.4 kg) IBW/kg (Calculated) : 56.9  Vital Signs: Temp: 98.9 F (37.2 C) (08/26 1200) Temp src: Oral (08/26 1200) BP: 113/55 mmHg (08/26 1200) Pulse Rate: 96 (08/26 1200) Intake/Output from previous day: 08/25 0701 - 08/26 0700 In: 1562.1 [I.V.:629.6; Blood:682.5; IV Piggyback:250] Out: -  Intake/Output from this shift: Total I/O In: 27.2 [I.V.:27.2] Out: 0   Labs:  Recent Labs  02/03/14 0711 02/03/14 1043 02/03/14 2257 02/04/14 0350  WBC 20.9* 21.1*  --  23.2*  HGB 7.4* 6.0* 8.8* 8.9*  PLT 402* 350  --  384  CREATININE 5.31*  --   --  6.02*   Estimated Creatinine Clearance: 10 ml/min (by C-G formula based on Cr of 6.02).  Recent Labs  02/04/14 0350  VANCORANDOM 26.3     Microbiology: Recent Results (from the past 720 hour(s))  WOUND CULTURE     Status: None   Collection Time    01/17/14  8:05 AM      Result Value Ref Range Status   Specimen Description LEG LEFT   Final   Special Requests NONE   Final   Gram Stain     Final   Value: RARE SQUAMOUS EPITHELIAL CELLS PRESENT     MODERATE WBC PRESENT, PREDOMINANTLY PMN     FEW GRAM POSITIVE COCCI IN CLUSTERS     MODERATE GRAM NEGATIVE RODS     RARE GRAM POSITIVE RODS   Culture     Final   Value: MODERATE STAPHYLOCOCCUS AUREUS     Note: RIFAMPIN AND GENTAMICIN SHOULD NOT BE USED AS SINGLE DRUGS FOR TREATMENT OF STAPH INFECTIONS.     MODERATE CITROBACTER AMALONATICUS     Performed at [facility]   Report Status 01/21/2014 FINAL   Final   Organism ID, Bacteria STAPHYLOCOCCUS AUREUS   Final   Organism ID, Bacteria CITROBACTER AMALONATICUS   Final  CULTURE, BLOOD (ROUTINE X 2)     Status: None   Collection Time     01/17/14  9:30 AM      Result Value Ref Range Status   Specimen Description BLOOD RIGHT ARM   Final   Special Requests     Final   Value: BOTTLES DRAWN AEROBIC AND ANAEROBIC 10CC BLUE, 5CC RED   Culture  Setup Time     Final   Value: 01/17/2014 20:29     Performed at [facility]   Culture     Final   Value: NO GROWTH 5 DAYS     Performed at [facility]   Report Status 01/23/2014 FINAL   Final  CULTURE, BLOOD (ROUTINE X 2)     Status: None   Collection Time    01/17/14  9:40 AM      Result Value Ref Range Status   Specimen Description BLOOD RIGHT HAND   Final   Special Requests BOTTLES DRAWN AEROBIC ONLY [facility]   Final   Culture  Setup Time     Final   Value: 01/17/2014 20:29     Performed at [facility]   Culture     Final   Value: NO GROWTH 5 DAYS     Performed at [facility]  Report Status 01/23/2014 FINAL   Final  MRSA PCR SCREENING     Status: None   Collection Time    01/17/14  9:33 PM      Result Value Ref Range Status   MRSA by PCR NEGATIVE  NEGATIVE Final   Comment:            The GeneXpert MRSA Assay (FDA     approved for NASAL specimens     only), is one component of a     comprehensive MRSA colonization     surveillance program. It is not     intended to diagnose MRSA     infection nor to guide or     monitor treatment for     MRSA infections.  CULTURE, BLOOD (ROUTINE X 2)     Status: None   Collection Time    01/31/14  4:28 PM      Result Value Ref Range Status   Specimen Description BLOOD RIGHT HAND   Final   Special Requests BOTTLES DRAWN AEROBIC AND ANAEROBIC 10 CC   Final   Culture  Setup Time     Final   Value: 02/01/2014 00:09     Performed at [facility]   Culture     Final   Value:        BLOOD CULTURE RECEIVED NO GROWTH TO DATE CULTURE WILL BE HELD FOR 5 DAYS BEFORE ISSUING A FINAL NEGATIVE REPORT     Performed at [facility]   Report Status PENDING   Incomplete  CULTURE, BLOOD (ROUTINE X  2)     Status: None   Collection Time    02/01/14  7:22 AM      Result Value Ref Range Status   Specimen Description BLOOD RIGHT HAND   Final   Special Requests BOTTLES DRAWN AEROBIC AND ANAEROBIC 5CC EACH   Final   Culture  Setup Time     Final   Value: 02/01/2014 17:45     Performed at [facility]   Culture     Final   Value:        BLOOD CULTURE RECEIVED NO GROWTH TO DATE CULTURE WILL BE HELD FOR 5 DAYS BEFORE ISSUING A FINAL NEGATIVE REPORT     Performed at [facility]   Report Status PENDING   Incomplete  SURGICAL PCR SCREEN     Status: Abnormal   Collection Time    02/02/14  5:04 AM      Result Value Ref Range Status   MRSA, PCR NEGATIVE  NEGATIVE Final   Staphylococcus aureus POSITIVE (*) NEGATIVE Final   Comment:            The Xpert SA Assay (FDA     approved for NASAL specimens     in patients over 72 years of age),     is one component of     a comprehensive surveillance     program.  Test performance has     been validated by [employer] for patients greater     than or equal to 10 year old.     It is not intended     to diagnose infection nor to     guide or monitor treatment.  MRSA PCR SCREENING     Status: None   Collection Time    02/03/14 10:47 AM      Result Value Ref Range Status   MRSA by  PCR NEGATIVE  NEGATIVE Final   Comment:            The GeneXpert MRSA Assay (FDA     approved for NASAL specimens     only), is one component of a     comprehensive MRSA colonization     surveillance program. It is not     intended to diagnose MRSA     infection nor to guide or     monitor treatment for     MRSA infections.    Anti-infectives   Start     Dose/Rate Route Frequency Ordered Stop   02/04/14 1200  vancomycin (VANCOCIN) IVPB 1000 mg/200 mL premix  Status:  Discontinued     1,000 mg 200 mL/hr over 60 Minutes Intravenous Every M-W-F (Hemodialysis) 02/01/14 0819 02/04/14 0931   02/04/14 1200  vancomycin (VANCOCIN) IVPB 750 mg/150  ml premix     750 mg 150 mL/hr over 60 Minutes Intravenous Every M-W-F (Hemodialysis) 02/04/14 0931     02/03/14 1200  vancomycin (VANCOCIN) IVPB 1000 mg/200 mL premix  Status:  Discontinued     1,000 mg 200 mL/hr over 60 Minutes Intravenous  Once 02/02/14 1348 02/03/14 1610   01/31/14 1200  vancomycin (VANCOCIN) IVPB 1000 mg/200 mL premix     1,000 mg 200 mL/hr over 60 Minutes Intravenous  Once 01/29/14 1329 01/31/14 1111   01/28/14 1200  vancomycin (VANCOCIN) IVPB 1000 mg/200 mL premix  Status:  Discontinued     1,000 mg 200 mL/hr over 60 Minutes Intravenous Every M-W-F (Hemodialysis) 01/27/14 2005 02/01/14 0819   01/28/14 0430  piperacillin-tazobactam (ZOSYN) IVPB 2.25 g     2.25 g 100 mL/hr over 30 Minutes Intravenous 3 times per day 01/27/14 2005     01/27/14 2015  vancomycin (VANCOCIN) 2,000 mg in sodium chloride 0.9 % 500 mL IVPB     2,000 mg 250 mL/hr over 120 Minutes Intravenous  Once 01/27/14 2005 01/27/14 2336   01/27/14 2015  piperacillin-tazobactam (ZOSYN) IVPB 3.375 g     3.375 g 100 mL/hr over 30 Minutes Intravenous  Once 01/27/14 2005 01/28/14 0430      Assessment: 26 yom on Vancomycin/Zosyn Day 9 for septic shock/wound infection. S/p L AKA 8/24 (POD #2). Afebrile, wbc elevated trending up 23.2. Pt has ESRD- HD normally MWF (got dialysis 8/26, last HD session 8/22). Adjusted vancomycin post-HD dose due to elevated pre-HD level.  VR 8/26 @0350  >> 26.3  Vanc 8/18>>  Zosyn 8/19>> Ancef 8/24 (surg) x 1 dose  8/23 BC x2>> NGTD 8/24 Surg PCR >> staph aur+, MRSA neg  Goal of Therapy:  Pre-HD Vancomycin level: 15-25 mcg/ml  Plan:  -Decrease vancomycin to 750mg  IV qHD-MWF -Continue Zosyn 2.25g IV q8h -F/u c/s, clinical progress -F/u abx de-esc/dot  Elicia Lamp, PharmD Clinical Pharmacist - Resident Pager [phone] 02/04/2014 12:57 PM

## 2014-02-04 NOTE — Progress Notes (Signed)
PULMONARY / CRITICAL CARE MEDICINE   Name: Sean [name] MRN: [id number] DOB: Jun 18, 1935    ADMISSION DATE:  01/27/2014 CONSULTATION DATE:  02/04/2014  REFERRING MD :  Dillard Essex  CHIEF COMPLAINT:  AMS and Hypotension  INITIAL PRESENTATION: 78 y.o. M who was admitted 8/18 with infected wound of LLE and subsequently underwent left AKA on 8/24.  On evening of 8/24 began to have some hypotension and AMS.  Symptoms persisted into AM of 8/25.  SBP down into 60's and pt more altered, WBC up to 21; hence PCCM was consulted.   SIGNIFICANT EVENTS/STUDIES: 8/08 admitted with sepsis due to infected LLE wound 8/12 discharged 8/18 adm via ED with recurrent sepsis and persistent LLE infection  8/24 L AKA ([place]) 8/25 transferred to ICU and started on pressors 8/25 CT Abd/Pelvis:  acute cholecystitis, no stones, no ischemic bowel. 8/25 CT Head:  old left occipital infarct, chronic white matter ischemic change.  No acute changes. 8/25 cardiac markers positive. Cards consult. Deemed demand ischemia. No cardiac intervention planned 8/25 transfused 2 units PRBCs for Hgb 6.0 8/25 CCS consult: deemed poor surgical candidate. Percutaneous cholecystotomy tube requested of IR 8/25 TTE: Markedly abnormal septal motion EF hard to estimate given this and poor image qualithy but likely mild to moderately reduced. The cavity size was normal. There was mild concentric hypertrophy. LA moderately dilated 8/26 Perc choly tube placed by IR. Weaing off vasopressors. Peak trop I 4.39  SUBJECTIVE:  RASS 0. + F/C. Poorly oriented. Episodic abdominal pain  VITAL SIGNS: Temp:  [97.8 F (36.6 C)-99.7 F (37.6 C)] 98.9 F (37.2 C) (08/26 1200) Pulse Rate:  [51-110] 97 (08/26 1426) Resp:  [14-26] 17 (08/26 1426) BP: (75-126)/(24-72) 101/59 mmHg (08/26 1426) SpO2:  [79 %-100 %] 100 % (08/26 1426) Weight:  [83.4 kg (183 lb 13.8 oz)] 83.4 kg (183 lb 13.8 oz) (08/26 0747) HEMODYNAMICS:   VENTILATOR SETTINGS:   INTAKE  / OUTPUT: Intake/Output     08/25 0701 - 08/26 0700 08/26 0701 - 08/27 0700   P.O. 0    I.V. (mL/kg) 629.6 (7.5) 42.4 (0.5)   Blood 682.5    IV Piggyback 250 50   Total Intake(mL/kg) 1562.1 (18.7) 92.4 (1.1)   Total Output   0   Net +1562.1 +92.4        Stool Occurrence 4 x      PHYSICAL EXAMINATION: General: RASS 0. + F/C Neuro: poorly oriented. MAEs HEENT: WNL Cardiovascular: RRR, no M  Lungs: clear. Abdomen: BS x 4, soft.  Tenderness in RUQ with guarding Ext: Left AKA, dressings C/D/I.  LUE graft.  LABS: I have reviewed all of today's lab results. Relevant abnormalities are discussed in the A/P section  CXR: NNF  ASSESSMENT / PLAN:  PULMONARY A: No acute issues P:   Supplemental O2 if needed to maintain SpO2 > 92%.  CARDIOVASCULAR R IJ CVL (placed by IR) 8/20 >>  A:  Septic shock, improving Type 2 NSTEMI Chronic LBBB CAD Hx HTN Hx HLD P:  Wean vasopressors to off for MAP > 55 mmHg Holding outpatient lipitor, coreg, bidil for now. Discussed with Dr Ellyn Hack:  No role for diagnostic intervention presently  Consider re-consult Cards for pre-op eval if major surgery planned   RENAL A:   ESRD - normally dialyzes M/W/F P:   Monitor BMET intermittently Monitor I/Os Correct electrolytes as indicated HD per Renal Service Discussed with Dr Posey Pronto  GASTROINTESTINAL A:   Acute cholecystitis H/O GERD P:   SUP: IV  PPI NPO for now  HEMATOLOGIC A:   Acute blood loss anemia P:  DVT px: SCDs Monitor CBC intermittently Transfuse per usual ICU guidelines  INFECTIOUS A:   Severe sepsis Infected LLE wound - s/p AKA 8/24 Acute cholecystitis MRSA nasal swab positive P:   Micro data reviewed Abx: Vanc 8/19 >>  Abx: Zosyn 8/18 >>  PCT algorithm Vascular following  ENDOCRINE A:   DM II Minimal hyperglycemia Risk of hypoglycemia (NPO, ESRD) Hyperparathyroidism P:   Cont CBGs Holding SSI.  NEUROLOGIC A:   Dementia H/O CVA TME,  improving Pain Anxiety Depression P:   Cont PRN fentanyl Hold outpatient hydroxyzine, paxil for now.   TODAY'S SUMMARY:      I have personally obtained a history, examined the patient, evaluated laboratory and imaging results, formulated the assessment and plan and placed orders.  CRITICAL CARE: The patient is critically ill with multiple organ systems failure and requires high complexity decision making for assessment and support, frequent evaluation and titration of therapies, application of advanced monitoring technologies and extensive interpretation of multiple databases. Critical Care Time devoted to patient care services described in this note is 40 minutes.    Merton Border, MD ; [facility] [phone].  After 5:30 PM or weekends, call [phone]

## 2014-02-04 NOTE — Progress Notes (Signed)
[facility] Progress Note  2 Days Post-Op  Subjective: Pt not c/o much pain.  No N/V.  Getting dialysis right now.  Won't be able to get perc chole tube until later today  Objective: Vital signs in last 24 hours: Temp:  [97.6 F (36.4 C)-101.7 F (38.7 C)] 98.2 F (36.8 C) (08/26 0406) Pulse Rate:  [51-122] 100 (08/26 0700) Resp:  [16-28] 20 (08/26 0700) BP: (63-126)/(24-72) 103/50 mmHg (08/26 0700) SpO2:  [79 %-100 %] 96 % (08/26 0700) Weight:  [180 lb 5.4 oz (81.8 kg)-183 lb 13.8 oz (83.4 kg)] 183 lb 13.8 oz (83.4 kg) (08/26 0630) Last BM Date: 02/03/14  Intake/Output from previous day: 08/25 0701 - 08/26 0700 In: 1562.1 [I.V.:629.6; Blood:682.5; IV Piggyback:250] Out: -  Intake/Output this shift:    PE: Gen:  Alert, NAD, pleasant Abd: Soft, mild distension, tender in RUQ, +BS, no HSM   Lab Results:   Recent Labs  02/03/14 1043 02/03/14 2257 02/04/14 0350  WBC 21.1*  --  23.2*  HGB 6.0* 8.8* 8.9*  HCT 19.3* 27.2* 27.7*  PLT 350  --  384   BMET  Recent Labs  02/03/14 0711 02/04/14 0350  NA 137 137  K 4.6 4.0  CL 97 98  CO2 25 23  GLUCOSE 121* 111*  BUN 22 27*  CREATININE 5.31* 6.02*  CALCIUM 8.3* 8.4   PT/INR  Recent Labs  02/04/14 0350  LABPROT 21.5*  INR 1.87*   CMP     Component Value Date/Time   NA 137 02/04/2014 0350   K 4.0 02/04/2014 0350   CL 98 02/04/2014 0350   CO2 23 02/04/2014 0350   GLUCOSE 111* 02/04/2014 0350   BUN 27* 02/04/2014 0350   CREATININE 6.02* 02/04/2014 0350   CALCIUM 8.4 02/04/2014 0350   PROT 6.3 02/04/2014 0350   ALBUMIN 1.3* 02/04/2014 0350   AST 36 02/04/2014 0350   ALT <5 02/04/2014 0350   ALKPHOS 88 02/04/2014 0350   BILITOT 0.7 02/04/2014 0350   GFRNONAA 8* 02/04/2014 0350   GFRAA 9* 02/04/2014 0350   Lipase     Component Value Date/Time   LIPASE 101* 12/09/2011 0218       Studies/Results: Ct Head Wo Contrast  02/03/2014   CLINICAL DATA:  Mental status change.  EXAM: CT HEAD WITHOUT  CONTRAST  TECHNIQUE: Contiguous axial images were obtained from the base of the skull through the vertex without intravenous contrast.  COMPARISON:  CT 01/17/2014.  12/13/2013.  FINDINGS: No intra-axial or extra-axial pathologic fluid or blood collection. Left occipital encephalomalacia. White matter changes consistent with chronic ischemia. Cerebral vascular disease. Visualized orbits and paranasal sinuses are clear. Mastoids are clear. No acute bony abnormality.  IMPRESSION: 1. Old left occipital infarct. Chronic white matter ischemic change. Diffuse atrophy. 2. No acute abnormality.  Exam stable from prior exams.   Electronically Signed   By: Marcello Moores  Register   On: 02/03/2014 12:31   Ct Abdomen Pelvis W Contrast  02/03/2014   CLINICAL DATA:  Right upper quadrant abdominal pain. Possible colonic ischemia. Patient has a history of dialysis.  EXAM: CT ABDOMEN AND PELVIS WITH CONTRAST  TECHNIQUE: Multidetector CT imaging of the abdomen and pelvis was performed using the standard protocol following bolus administration of intravenous contrast.  CONTRAST:  175mL OMNIPAQUE IOHEXOL 300 MG/ML  SOLN  COMPARISON:  12/09/2011.  FINDINGS: Gallbladder is distended with wall thickening and pericholecystic inflammation/edema. There are dependent small stones. Findings are consistent with acute cholecystitis given history.  Heart is mild to moderately enlarged. There is dependent subsegmental atelectasis.  Liver and spleen are unremarkable. Common bile duct is normal in caliber for age. No pancreatic masses or inflammatory change. No adrenal masses.  Diffuse bilateral renal cortical thinning. Multiple low-density renal masses consistent with cysts. No hydronephrosis. Normal ureters. Bladder is decompressed.  No pathologically enlarged lymph nodes.  No ascites.  There are atherosclerotic vascular calcifications throughout the aorta and its branch vessels. Significant stenosis is noted throughout the superior mesenteric a  arteries/celiac axis. The superior mesenteric artery and celiac axis appear to originate from a common trunk from the aorta.  There are changes from previous right colon surgery. There is no bowel wall thickening or bowel wall air. No evidence of colonic or small bowel ischemia.  There is a small anterior abdominal wall fat containing hernia adjacent to a midline incision.  Bone show changes consistent with renal osteodystrophy. There are prominent subchondral cysts along both superior acetabula.  IMPRESSION: 1. Acute cholecystitis.  No evidence of ischemic bowel. 2. Multiple chronic findings as detailed. No other evidence of an acute abnormality.   Electronically Signed   By: Lajean Manes M.D.   On: 02/03/2014 12:35   Dg Chest Port 1 View  02/03/2014   CLINICAL DATA:  Assess airspace disease  EXAM: PORTABLE CHEST - 1 VIEW  COMPARISON:  01/27/2014  FINDINGS: Mild enlargement of the cardiac silhouette, stable. The aorta is uncoiled. No mediastinal or hilar masses.  Mild hazy left lung base opacity, most likely atelectasis. Infiltrate is possible. No pulmonary edema. No pleural effusion or pneumothorax.  Right central venous line has its tip near the caval atrial junction, well positioned.  IMPRESSION: Mild hazy opacity at the left lung base. This is likely atelectasis. A small pneumonia is possible. This is similar to opacity noted on the prior study, which supports atelectasis. Lungs otherwise clear.   Electronically Signed   By: Lajean Manes M.D.   On: 02/03/2014 12:54    Anti-infectives: Anti-infectives   Start     Dose/Rate Route Frequency Ordered Stop   02/04/14 1200  vancomycin (VANCOCIN) IVPB 1000 mg/200 mL premix     1,000 mg 200 mL/hr over 60 Minutes Intravenous Every M-W-F (Hemodialysis) 02/01/14 0819     02/03/14 1200  vancomycin (VANCOCIN) IVPB 1000 mg/200 mL premix  Status:  Discontinued     1,000 mg 200 mL/hr over 60 Minutes Intravenous  Once 02/02/14 1348 02/03/14 1610   01/31/14 1200   vancomycin (VANCOCIN) IVPB 1000 mg/200 mL premix     1,000 mg 200 mL/hr over 60 Minutes Intravenous  Once 01/29/14 1329 01/31/14 1111   01/28/14 1200  vancomycin (VANCOCIN) IVPB 1000 mg/200 mL premix  Status:  Discontinued     1,000 mg 200 mL/hr over 60 Minutes Intravenous Every M-W-F (Hemodialysis) 01/27/14 2005 02/01/14 0819   01/28/14 0430  piperacillin-tazobactam (ZOSYN) IVPB 2.25 g     2.25 g 100 mL/hr over 30 Minutes Intravenous 3 times per day 01/27/14 2005     01/27/14 2015  vancomycin (VANCOCIN) 2,000 mg in sodium chloride 0.9 % 500 mL IVPB     2,000 mg 250 mL/hr over 120 Minutes Intravenous  Once 01/27/14 2005 01/27/14 2336   01/27/14 2015  piperacillin-tazobactam (ZOSYN) IVPB 3.375 g     3.375 g 100 mL/hr over 30 Minutes Intravenous  Once 01/27/14 2005 01/28/14 0430       Assessment/Plan Acute cholecystitis   Septic shock   ESRD HD MWF  Ischemic  CMP, sCHF w/ LVEF 25%  Anemia  Diabetes mellitus  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    Plan: 1.  Perc chole tube given his high risks for surgical intervention at this time 2.  The Perc chole tube may have to be permanent if he is not a good surgical candidate.  Family does not want surgical intervention.  We can discuss removal and/or possible surgical intervention if he was to improve significantly and could be cleared by cards for cholecystectomy in the future.  Would typically leave the perc chole tube in for at least 6-8 weeks. 3.  IVF, pain control, antiemetics, antibiotics (vanc Day#7/zosyn Day #8) 4.  Encourage mobilization, and IS as able to aid in recovery 5.  SCD's and heparin    LOS: 8 days    Coralie Keens 02/04/2014, 7:35 AM Pager: [phone]

## 2014-02-04 NOTE — Clinical Social Work Placement (Addendum)
Clinical Social Work Department CLINICAL SOCIAL WORK PLACEMENT NOTE 02/04/2014  Patient:  Sean [name], Sean [name]  Account Number:  [id number] Admit date:  01/27/2014  Clinical Social Worker:  Crawford Givens, LCSW  Date/time:  02/04/2014 03:42 AM  Clinical Social Work is seeking post-discharge placement for this patient at the following level of care:   SKILLED NURSING   (*CSW will update this form in Epic as items are completed)   02/03/2014  Patient/family provided with [place] Department of Clinical Social Work's list of facilities offering this level of care within the geographic area requested by the patient (or if unable, by the patient's family).  02/03/2014  Patient/family informed of their freedom to choose among providers that offer the needed level of care, that participate in Medicare, Medicaid or managed care program needed by the patient, have an available bed and are willing to accept the patient.    Patient/family informed of [facility]' ownership interest in [facility], as well as of the fact that they are under no obligation to receive care at this facility.  PASARR submitted to EDS on  PASARR number received on   FL2 transmitted to all facilities in geographic area requested by pt/family on  02/03/2014 FL2 transmitted to all facilities within larger geographic area on   Patient informed that his/her managed care company has contracts with or will negotiate with  certain facilities, including the following:     Patient/family informed of bed offers received:   Patient chooses bed at [name], [place] 02/09/2014) Physician recommends and patient chooses bed at  [place] [name], [facility] 02/09/2014)  Patient to be transferred to  [facility] on  02/09/2014 [name], [place] 02/09/2014) Patient to be transferred to facility by [name], [place] 02/09/2014) Patient and family notified of transfer on 02/09/2014 per unit CSW family and pt aware  [name], [place] 02/09/2014) Name of family member notified:  Pt alert and oriented x4.  Unit CSW made aware [name] 02/09/2014)  The following physician request were entered in Epic:   Additional Comments:

## 2014-02-04 NOTE — Progress Notes (Signed)
INITIAL NUTRITION ASSESSMENT  DOCUMENTATION CODES Per approved criteria  -Non-severe (moderate) malnutrition in the context of chronic illness -Obesity Unspecified   INTERVENTION:  Diet advancement per MD post cholecystostomy drain placement today.  Once diet advanced, add Nepro Shake PO TID, each supplement provides 425 kcal and 19 grams protein  Recommend renal multivitamin daily.  NUTRITION DIAGNOSIS: Inadequate oral intake related to inability to eat as evidenced by NPO status.   Goal: Intake to meet >90% of estimated nutrition needs to promote healing and recovery.  Monitor:  PO intake, labs, weight trend.  Reason for Assessment: MD Consult for poor intake  78 y.o. male  Admitting Dx: Open leg wound  ASSESSMENT: Patient admitted on 8/18 with an extensive necrotic left leg wound (PAD). S/P left AKA on 8/24. Now with acute cholecystitis, for perc drain by IR today.  Patient with increased nutrient needs to support wound healing. Per discussion with patient's daughter, patient has been eating poorly for the past few months, pant size down from 48 to 42. He has been complaining of PO intake causing nausea.  Weight down 5% over the past 1.5 months, likely partially related to fluid status with renal disease and recent AKA.  Nutrition Focused Physical Exam:  Subcutaneous Fat:  Orbital Region: WNL Upper Arm Region: WNL Thoracic and Lumbar Region: NA  Muscle:  Temple Region: mild depletion Clavicle Bone Region: WNL Clavicle and Acromion Bone Region: WNL Scapular Bone Region: NA Dorsal Hand: WNL Patellar Region: mild depletion Anterior Thigh Region: mild depletion Posterior Calf Region: mild depletion  Edema: none  Pt meets criteria for non-severe (moderate) MALNUTRITION in the context of chronic illness as evidenced by intake </= 75% of estimated energy requirement for >/= 1 month and mild depletion of muscle mass.   Height: Ht Readings from Last 1 Encounters:   02/03/14 5\' 3"  (1.6 m)    Weight: Wt Readings from Last 1 Encounters:  02/04/14 183 lb 13.8 oz (83.4 kg)    Ideal Body Weight: 56.4 kg  % Ideal Body Weight: 148%  Wt Readings from Last 10 Encounters:  02/04/14 183 lb 13.8 oz (83.4 kg)  02/04/14 183 lb 13.8 oz (83.4 kg)  01/21/14 181 lb 14.1 oz (82.5 kg)  01/14/14 190 lb (86.183 kg)  12/26/13 188 lb 6.4 oz (85.458 kg)  12/24/13 190 lb 7.6 oz (86.4 kg)  12/16/13 192 lb 3.9 oz (87.2 kg)  12/16/13 192 lb 3.9 oz (87.2 kg)  03/26/12 209 lb 6.4 oz (94.983 kg)  02/15/12 213 lb 6.4 oz (96.798 kg)    Usual Body Weight: 192 lb 1.5 months ago  % Usual Body Weight: 95%  BMI:  Body mass index is 32.58 kg/(m^2). class 1 obesity  Estimated Nutritional Needs: Kcal: 2000-2200 Protein: 90-100 gm Fluid: 1.2 L  Skin: stage 2 pressure ulcer on sacrum; left leg AKA site  Diet Order: NPO  EDUCATION NEEDS: -Education not appropriate at this time   Intake/Output Summary (Last 24 hours) at 02/04/14 1144 Last data filed at 02/04/14 1100  Gross per 24 hour  Intake 1487.7 ml  Output      0 ml  Net 1487.7 ml    Last BM: 8/26   Labs:   Recent Labs Lab 02/01/14 0722 02/03/14 0711 02/04/14 0350  NA 137 137 137  K 4.1 4.6 4.0  CL 96 97 98  CO2 29 25 23   BUN 8 22 27*  CREATININE 2.72* 5.31* 6.02*  CALCIUM 8.4 8.3* 8.4  MG  --   --  1.9  PHOS  --  3.5 4.0  GLUCOSE 153* 121* 111*    CBG (last 3)   Recent Labs  02/03/14 2311 02/04/14 0354 02/04/14 0839  GLUCAP 126* 105* 107*    Scheduled Meds: . antiseptic oral rinse  7 mL Mouth Rinse q12n4p  . atorvastatin  20 mg Oral q1800  . chlorhexidine  15 mL Mouth Rinse BID  . darbepoetin (ARANESP) injection - DIALYSIS  150 mcg Intravenous Q Wed-HD  . ferric gluconate (FERRLECIT/NULECIT) IV  62.5 mg Intravenous Q Wed-HD  . heparin  5,000 Units Subcutaneous 3 times per day  . insulin aspart  0-9 Units Subcutaneous 6 times per day  . pantoprazole (PROTONIX) IV  40 mg  Intravenous Q24H  . piperacillin-tazobactam (ZOSYN)  IV  2.25 g Intravenous 3 times per day  . sodium chloride  10 mL Intravenous Q12H  . vancomycin  750 mg Intravenous Q M,W,F-HD    Continuous Infusions: . norepinephrine (LEVOPHED) Adult infusion 6 mcg/min (02/04/14 0930)    Past Medical History  Diagnosis Date  . Hyperparathyroidism   . Arthritis   . Hyperlipidemia   . Hypertension   . Neuromuscular disorder   . Stroke   . GERD (gastroesophageal reflux disease)   . Migraine   . Heart attack 2011; 1990's  . Type II diabetes mellitus   . Peripheral neuropathy   . Anxiety   . Depression   . Dialysis patient     M, W, F; [place] ESRD (end stage renal disease) on dialysis   . CAD (coronary artery disease)     a. s/p NSTEMI and failed RCA PCI in 2011. b. Nuc 12/2013: nonischemic, prior inferior MI.  Marland Kitchen LBBB (left bundle branch block)   . Calciphylaxis     a. Multiple admissions for nonhealing L ulcer/sepsis, s/p L AKA 02/02/2014.  Marland Kitchen Chronic anemia   . Dementia     Past Surgical History  Procedure Laterality Date  . Colon surgery  2011    colon resection   . Parathyroidectomy  2011  . Capd insertion  09/12/2011    Procedure: LAPAROSCOPIC INSERTION CONTINUOUS AMBULATORY PERITONEAL DIALYSIS  (CAPD) CATHETER;  Surgeon: Edward Jolly, MD;  Location: [facility];  Service: General;  Laterality: N/A;  . Sp av dialysis shunt access existing *l*  2003  . Dg av dialysis shunt intro ndl*r* or      "not working" (09/19/11)  . Capd removal  03/12/2012    Procedure: CONTINUOUS AMBULATORY PERITONEAL DIALYSIS  (CAPD) CATHETER REMOVAL;  Surgeon: Edward Jolly, MD;  Location: [facility];  Service: General;  Laterality: N/A;  . Amputation Left 02/02/2014    Procedure: AMPUTATION ABOVE KNEE-LEFT;  Surgeon: Elam Dutch, MD;  Location: [facility];  Service: Vascular;  Laterality: Left;    Molli Barrows, [place], [place], [place] Pager [phone] After Hours Pager [phone]

## 2014-02-04 NOTE — Clinical Social Work Psychosocial (Addendum)
Clinical Social Work Department BRIEF PSYCHOSOCIAL ASSESSMENT 02/04/2014  Patient:  Sean [name], Sean [name]     Account Number:  [id number]     Admit date:  01/27/2014  Clinical Social Worker:  Frederico Hamman  Date/Time:  02/04/2014 03:33 AM  Referred by:  Physician  Date Referred:  01/30/2014 Referred for  SNF Placement   Other Referral:   Interview type:  Family Other interview type:    PSYCHOSOCIAL DATA Living Status:  FAMILY Admitted from facility:   Level of care:   Primary support name:  Jaquin Coy Primary support relationship to patient:  CHILD, ADULT Degree of support available:   Daughter [phone]) very supportive and involved with caring for patient.    CURRENT CONCERNS Current Concerns  Post-Acute Placement   Other Concerns:    SOCIAL WORK ASSESSMENT / PLAN CSW talked with patient's daughter, Anthonyjames Bargar by phone on 02/03/14 regarding discharge planning and recommendation of SNF placement. Daughter expressed agreement and her preference is [facility]. Daughter reported that she had completed a Medicaid application for last week in anticipation that patient will need rehab and long-term care. CSW explained facility search process and advised daughter that she will be contacted regarding facility responses.   Assessment/plan status:  Psychosocial Support/Ongoing Assessment of Needs Other assessment/ plan:   Information/referral to community resources:   SNF list emailed to daughter at [email or web address] on 02/03/14.    PATIENT'S/FAMILY'S RESPONSE TO PLAN OF CARE: Daughter very proactive and involved with patient's care. She is in agreement with short-term rehab.

## 2014-02-04 NOTE — Progress Notes (Signed)
  [facility] Progress Note   Subjective: Awake, responsive, remains confused. CT abd showed acute cholecystitis  Filed Vitals:   02/04/14 0615 02/04/14 0630 02/04/14 0645 02/04/14 0700  BP: 98/50 102/52 101/57 103/50  Pulse: 99 99 99 100  Temp:      TempSrc:      Resp: 21 21 18 20   Height:      Weight:  83.4 kg (183 lb 13.8 oz)    SpO2: 95% 94% 96% 96%   Exam: Confused, calm, pleasant No jvd Chest is clear bilat RRR no MRG ABd RUQ tender, no peritoneal signs, no ascites No LE edema, L AKA dressed LUA AVG +bruit Neuro is nf, Ox 3  CXR atelectasis L base, o/w clear CT abd > acute cholecystitis CT head > old CVA, no acute  HD: MWF [place] 4h   82kg   4K/2.25 Bath  Heparin 6000  LUA AVG  450/A1.5 ARanesp 100 ug q Wed, Venofer 100 mg q week  Assessment: 1 Sepsis / hypotension / abd pain / acute cholecystitis - for perc drain by IR today, on abx and pressors 2 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 for necrotic calciphylaxis wounds 3 ESRD on HD 4 Volume no vol excess, up 1 kg 5 Anemia cont aranesp, Hb 8.9 6 HPTH Ca 9, P 3.5, no vit D or binders 7 DM per primary 8 Hx old CVA / underlying mild dementia 9 CAD - EF 25% by Myoview last admit July, no ischemia , cath recommended but pt declined   Plan- HD in ICU today, no fluid off    [name] MD  pager [phone]    cell [phone]  02/04/2014, 7:36 AM     Recent Labs Lab 02/01/14 0722 02/03/14 0711 02/04/14 0350  NA 137 137 137  K 4.1 4.6 4.0  CL 96 97 98  CO2 29 25 23   GLUCOSE 153* 121* 111*  BUN 8 22 27*  CREATININE 2.72* 5.31* 6.02*  CALCIUM 8.4 8.3* 8.4  PHOS  --  3.5 4.0    Recent Labs Lab 02/03/14 0711 02/04/14 0350  AST  --  36  ALT  --  <5  ALKPHOS  --  88  BILITOT  --  0.7  PROT  --  6.3  ALBUMIN 1.4* 1.3*    Recent Labs Lab 02/03/14 0711 02/03/14 1043 02/03/14 2257 02/04/14 0350  WBC 20.9* 21.1*  --  23.2*  HGB 7.4* 6.0* 8.8* 8.9*  HCT 24.4* 19.3* 27.2* 27.7*  MCV 87.1 85.8  --  84.2   PLT 402* 350  --  384   . antiseptic oral rinse  7 mL Mouth Rinse q12n4p  . atorvastatin  20 mg Oral q1800  . chlorhexidine  15 mL Mouth Rinse BID  . darbepoetin (ARANESP) injection - DIALYSIS  150 mcg Intravenous Q Wed-HD  . ferric gluconate (FERRLECIT/NULECIT) IV  62.5 mg Intravenous Q Wed-HD  . heparin  5,000 Units Subcutaneous 3 times per day  . insulin aspart  0-9 Units Subcutaneous 6 times per day  . pantoprazole (PROTONIX) IV  40 mg Intravenous Q24H  . piperacillin-tazobactam (ZOSYN)  IV  2.25 g Intravenous 3 times per day  . sodium chloride  10 mL Intravenous Q12H  . vancomycin  1,000 mg Intravenous Q M,W,F-HD   . norepinephrine (LEVOPHED) Adult infusion 8 mcg/min (02/04/14 0500)   sodium chloride, acetaminophen, acetaminophen, albuterol, fentaNYL, heparin, ipratropium-albuterol, lidocaine (PF), lidocaine-prilocaine, ondansetron (ZOFRAN) IV, ondansetron, pentafluoroprop-tetrafluoroeth

## 2014-02-04 NOTE — Procedures (Signed)
Cholecystostomy placement under [place] and fluoro No complication No blood loss. See complete dictation in [facility].

## 2014-02-05 DIAGNOSIS — A419 Sepsis, unspecified organism: Secondary | ICD-10-CM

## 2014-02-05 DIAGNOSIS — R6520 Severe sepsis without septic shock: Secondary | ICD-10-CM

## 2014-02-05 DIAGNOSIS — I219 Acute myocardial infarction, unspecified: Secondary | ICD-10-CM

## 2014-02-05 LAB — COMPREHENSIVE METABOLIC PANEL
ALBUMIN: 1.2 g/dL — AB (ref 3.5–5.2)
ALK PHOS: 86 U/L (ref 39–117)
ALT: <5 U/L (ref 0–53)
ANION GAP: 16 — AB (ref 5–15)
AST: 38 U/L — ABNORMAL HIGH (ref 0–37)
BILIRUBIN TOTAL: 0.5 mg/dL (ref 0.3–1.2)
BUN: 16 mg/dL (ref 6–23)
CHLORIDE: 98 meq/L (ref 96–112)
CO2: 25 mEq/L (ref 19–32)
CREATININE: 4.01 mg/dL — AB (ref 0.50–1.35)
Calcium: 8 mg/dL — ABNORMAL LOW (ref 8.4–10.5)
GFR calc non Af Amer: 13 mL/min — ABNORMAL LOW (ref >90)
GFR, EST AFRICAN AMERICAN: 15 mL/min — AB (ref >90)
GLUCOSE: 89 mg/dL (ref 70–99)
POTASSIUM: 3.4 meq/L — AB (ref 3.7–5.3)
Sodium: 139 mEq/L (ref 137–147)
Total Protein: 5.8 g/dL — ABNORMAL LOW (ref 6.0–8.3)

## 2014-02-05 LAB — GLUCOSE, CAPILLARY
GLUCOSE-CAPILLARY: 125 mg/dL — AB (ref 70–99)
Glucose-Capillary: 174 mg/dL — ABNORMAL HIGH (ref 70–99)
Glucose-Capillary: 85 mg/dL (ref 70–99)
Glucose-Capillary: 90 mg/dL (ref 70–99)
Glucose-Capillary: 93 mg/dL (ref 70–99)

## 2014-02-05 LAB — CBC
HEMATOCRIT: 27.8 % — AB (ref 39.0–52.0)
HEMOGLOBIN: 8.8 g/dL — AB (ref 13.0–17.0)
MCH: 26.9 pg (ref 26.0–34.0)
MCHC: 31.7 g/dL (ref 30.0–36.0)
MCV: 85 fL (ref 78.0–100.0)
Platelets: 334 10*3/uL (ref 150–400)
RBC: 3.27 MIL/uL — ABNORMAL LOW (ref 4.22–5.81)
RDW: 18.1 % — AB (ref 11.5–15.5)
WBC: 13.1 10*3/uL — AB (ref 4.0–10.5)

## 2014-02-05 LAB — PROCALCITONIN: Procalcitonin: 4.1 ng/mL

## 2014-02-05 MED ORDER — PNEUMOCOCCAL VAC POLYVALENT 25 MCG/0.5ML IJ INJ
0.5000 mL | INJECTION | INTRAMUSCULAR | Status: DC
  Filled 2014-02-05: qty 0.5

## 2014-02-05 NOTE — Progress Notes (Signed)
  [facility] Progress Note   Subjective: feeling much better, off pressors  Filed Vitals:   02/05/14 1400 02/05/14 1500 02/05/14 1600 02/05/14 1700  BP: 94/50  106/58   Pulse: 90 90 93 92  Temp:  98.5 F (36.9 C)    TempSrc:  Oral    Resp: 18 14 16 14   Height:      Weight:      SpO2: 98% 98% 98% 95%   Exam: Confused, calm, pleasant No jvd Chest is clear bilat RRR no MRG ABd RUQ tender, no peritoneal signs, no ascites No LE edema, L AKA dressed LUA AVG +bruit Neuro is nf, Ox 3  CXR atelectasis L base, o/w clear CT abd > acute cholecystitis CT head > old CVA, no acute  HD: MWF [place] 4h   82kg   4K/2.25 [place]  Heparin 6000  LUA AVG  450/A1.5 ARanesp 100 ug q Wed, Venofer 100 mg q week  Assessment: 1 Sepsis / acute cholecystitis - s/p perc drain by IR, WBC down and off pressors, doing better 2 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 (calciphylaxis wounds) 3 ESRD on HD 4 Volume no vol excess, up 1 kg 5 Anemia cont aranesp, Hb 8.9 6 HPTH Ca 9, P 3.5, no vit D or binders 7 DM per primary 8 Hx old CVA / underlying mild dementia 9 CAD - EF 25% by Myoview last admit July, no ischemia , cath recommended but pt declined   Plan- HD tomorrow upstairs    [name] MD  pager [phone]    cell [phone]  02/05/2014, 5:24 PM     Recent Labs Lab 02/03/14 0711 02/04/14 0350 02/05/14 0300  NA 137 137 139  K 4.6 4.0 3.4*  CL 97 98 98  CO2 25 23 25   GLUCOSE 121* 111* 89  BUN 22 27* 16  CREATININE 5.31* 6.02* 4.01*  CALCIUM 8.3* 8.4 8.0*  PHOS 3.5 4.0  --     Recent Labs Lab 02/03/14 0711 02/04/14 0350 02/05/14 0300  AST  --  36 38*  ALT  --  <5 <5  ALKPHOS  --  88 86  BILITOT  --  0.7 0.5  PROT  --  6.3 5.8*  ALBUMIN 1.4* 1.3* 1.2*    Recent Labs Lab 02/03/14 1043 02/03/14 2257 02/04/14 0350 02/05/14 0300  WBC 21.1*  --  23.2* 13.1*  HGB 6.0* 8.8* 8.9* 8.8*  HCT 19.3* 27.2* 27.7* 27.8*  MCV 85.8  --  84.2 85.0  PLT 350  --  384 334   . antiseptic oral  rinse  7 mL Mouth Rinse q12n4p  . atorvastatin  20 mg Oral q1800  . chlorhexidine  15 mL Mouth Rinse BID  . darbepoetin (ARANESP) injection - DIALYSIS  150 mcg Intravenous Q Wed-HD  . ferric gluconate (FERRLECIT/NULECIT) IV  62.5 mg Intravenous Q Wed-HD  . heparin  5,000 Units Subcutaneous 3 times per day  . piperacillin-tazobactam (ZOSYN)  IV  2.25 g Intravenous 3 times per day  . [START ON 02/06/2014] pneumococcal 23 valent vaccine  0.5 mL Intramuscular Tomorrow-1000  . sodium chloride  10 mL Intravenous Q12H  . sodium chloride  10 mL Intracatheter Q8H   . sodium chloride 10 mL/hr at 02/05/14 0835   acetaminophen, acetaminophen, albuterol, fentaNYL, heparin, ipratropium-albuterol, lidocaine (PF), lidocaine-prilocaine, ondansetron (ZOFRAN) IV, ondansetron, pentafluoroprop-tetrafluoroeth

## 2014-02-05 NOTE — Progress Notes (Signed)
PULMONARY / CRITICAL CARE MEDICINE   Name: Sean [name] MRN: [id number] DOB: 16-Feb-1936    ADMISSION DATE:  01/27/2014 CONSULTATION DATE:  02/05/2014  REFERRING MD :  Dillard Essex  CHIEF COMPLAINT:  AMS and Hypotension  INITIAL PRESENTATION: 78 y.o. M who was admitted 8/18 with infected wound of LLE and subsequently underwent left AKA on 8/24.  On evening of 8/24 began to have some hypotension and AMS.  Symptoms persisted into AM of 8/25.  SBP down into 60's and pt more altered, WBC up to 21; hence PCCM was consulted.   SIGNIFICANT EVENTS/STUDIES: 8/08 admitted with sepsis due to infected LLE wound 8/12 discharged 8/18 adm via ED with recurrent sepsis and persistent LLE infection  8/24 L AKA ([facility]) 8/25 transferred to ICU and started on pressors 8/25 CT Abd/Pelvis:  acute cholecystitis, no stones, no ischemic bowel. 8/25 CT Head:  old left occipital infarct, chronic white matter ischemic change.  No acute changes. 8/25 cardiac markers positive. Cards consult. Deemed demand ischemia. No cardiac intervention planned 8/25 transfused 2 units PRBCs for Hgb 6.0 8/25 CCS consult: deemed poor surgical candidate. Percutaneous cholecystotomy tube requested of IR 8/25 TTE: Markedly abnormal septal motion EF hard to estimate given this and poor image qualithy but likely mild to moderately reduced. The cavity size was normal. There was mild concentric hypertrophy. LA moderately dilated 8/26 Perc choly tube placed by IR. Weaing off vasopressors. Peak trop I 4.39 8/27 Much improved overall. Off vasopressors. Pain much better controlled. No distress. Transfer to tele bed ordered  SUBJECTIVE:  Much improved overall. Off vasopressors. Pain much better controlled. No distress.  VITAL SIGNS: Temp:  [97.7 F (36.5 C)-98.3 F (36.8 C)] 97.9 F (36.6 C) (08/27 0831) Pulse Rate:  [91-101] 92 (08/27 1200) Resp:  [14-23] 22 (08/27 1200) BP: (74-117)/(40-75) 96/75 mmHg (08/27 1200) SpO2:  [92 %-100 %]  99 % (08/27 1200) HEMODYNAMICS:   VENTILATOR SETTINGS:   INTAKE / OUTPUT: Intake/Output     08/26 0701 - 08/27 0700 08/27 0701 - 08/28 0700   I.V. (mL/kg) 163.8 (2) 50 (0.6)   Blood     Other 17    IV Piggyback 150 50   Total Intake(mL/kg) 330.8 (4) 100 (1.2)   Emesis/NG output 1    Drains 315    Stool 1    Total Output 317     Net +13.8 +100        Stool Occurrence 1 x      PHYSICAL EXAMINATION: General: RASS 0. + F/C, NAD Neuro: poorly oriented. MAEs HEENT: WNL Cardiovascular: RRR, no M  Lungs: clear. Abdomen: BS x 4, soft.  Tenderness in RUQ improved Ext: Left AKA, dressings C/D/I.  LUE graft.  LABS: I have reviewed all of today's lab results. Relevant abnormalities are discussed in the A/P section  CXR: NNF  ASSESSMENT / PLAN:  PULMONARY A: No acute issues P:   Supplemental O2 if needed to maintain SpO2 > 92%.  CARDIOVASCULAR R IJ CVL (placed by IR) 8/20 >>  A:  Septic shock, resolved Type 2 NSTEMI Chronic LBBB CAD Hx HTN Hx HLD P:  Telemetry monitoring after transfer out of ICU Holding outpatient lipitor, coreg, bidil for now. Discussed with Dr Ellyn Hack 8/26:  Has had recent nuclear med study without evidence of reversible ischemia  No role for diagnostic intervention presently  Consider re-consult Cards for pre-op eval if major surgery planned   RENAL A:   ESRD - normally dialyzes M/W/F P:   Monitor  BMET intermittently Monitor I/Os Correct electrolytes as indicated HD per Renal Service  GASTROINTESTINAL A:   Acute cholecystitis H/O GERD P:   SUP: IV PPI Advance diet per CCS recs  HEMATOLOGIC A:   Acute blood loss anemia, no further evidence of bleeding P:  DVT px: SQ heparin Monitor CBC intermittently Transfuse per usual ICU guidelines  INFECTIOUS A:   Severe sepsis Infected LLE wound - s/p AKA 8/24 Acute cholecystitis - s/p perc choly 8/26 MRSA nasal swab positive P:   Micro data reviewed Abx: Vanc 8/19 >> 8/27 Abx:  Zosyn 8/18 >>  Vascular managing post op  CCS/IR overseeing mgmt of cholecystitis  ENDOCRINE A:   DM II, controlled Minimal hyperglycemia Risk of hypoglycemia (ESRD) Hyperparathyroidism P:   Cont CBGs Holding SSI - resume PRN for glu > 180  NEUROLOGIC A:   Dementia H/O CVA TME, resolved Pain Anxiety Depression P:   Cont PRN fentanyl Holding outpatient hydroxyzine, paxil for now    TODAY'S SUMMARY:  Transfer to tele bed. TRH to resume care as of AM 8/28 and PCCM to sign off. Discussed with Dr Marton Redwood, MD ; [facility] [phone].  After 5:30 PM or weekends, call [phone]

## 2014-02-05 NOTE — Evaluation (Signed)
Physical Therapy Evaluation Patient Details Name: TIMOUTHY GILARDI MRN: [id number] DOB: [birth year] Today's Date: 02/05/2014   History of Present Illness  Surafel Hilleary is a 78 y.o. male, with past medical history significant for end-stage renal disease with hyperparathyroidism and severe calciphylaxis admitted 11/27/13 for evaluation of an advanced left leg wound . The patient was admitted and treated for right leg cellulitis and sepsis and was discharged 8 days PTA.   Pt.  underwent Lt. AKA 8/24.  Pt devoloped hypotension and AMS 8/24; CT abd/pelvis showed acute cholecystitis; CT head old left occipital infarct with chronic Majed Pellegrin matter ischemic changes.  No acute event.  8/25 pt deemed poor surgical candidate for cholecystitis so percutaneous cholecystotomy tube was placed 8/26.  Pt weaned off pressors on 8/26.  PMH includes:  stroke; MI; peripheral neuropathy; depression.  Clinical Impression  Pt admitted withabove. Pt currently with functional limitations due to the deficits listed below (see PT Problem List).  Pt is able to participate given time to perform activities.  Feel he will do well with PT/OT.   Pt will benefit from skilled PT to increase their independence and safety with mobility to allow discharge to the venue listed below.     Follow Up Recommendations SNF    Equipment Recommendations  Other (comment) (TBA)    Recommendations for Other Services       Precautions / Restrictions Precautions Precautions: Fall Precaution Comments: LAKA      Mobility  Bed Mobility Overal bed mobility: +2 for physical assistance Bed Mobility: Supine to Sit;Sit to Supine Rolling: Max assist;+2 for physical assistance   Supine to sit: Max assist;+2 for physical assistance Sit to supine: Max assist;+2 for physical assistance   General bed mobility comments: Pt requires assist with moving LEs to EOB and to lift UB/trunk from bed.  Pt initially with pain Lt. LE, but that improved once hips  scooted fully to EOB   Transfers                 General transfer comment: unable due to LE pain   Ambulation/Gait                Stairs            Wheelchair Mobility    Modified Rankin (Stroke Patients Only)       Balance Overall balance assessment: Needs assistance;History of Falls Sitting-balance support: Feet supported Sitting balance-Leahy Scale: Poor Sitting balance - Comments: initially required max A with pt leaning posteriorly and to the Rt, but with facilitation, pt able to maintain EOB sitting with min guard assist with bil. UEs supported on bedside table.  Pt performed lateral rotation bil directions with therapist assist and cues.  Needed mod assist and cues to facilitate this rotation.  Also performed LE exercises while seated EOB.   Postural control: Posterior lean                                   Pertinent Vitals/Pain Pain Assessment: Faces Faces Pain Scale: Hurts even more Pain Location: left LE, right heel with movement Pain Descriptors / Indicators: Aching Pain Intervention(s): Limited activity within patient's tolerance;Monitored during session;Premedicated before session;Repositioned;Relaxation VSS- see OT note for specifics    Home Living Family/patient expects to be discharged to:: Skilled nursing facility Living Arrangements: Alone   Type of Home: House Home Access: Stairs to enter     Home Layout: One  level Home Equipment: Cane - single point;Wheelchair - [employer] - 4 wheels      Prior Function Level of Independence: Needs assistance   Gait / Transfers Assistance Needed: Pt was ambulatory with RW ~ 2 mos prior to admit.  He has not been ambulatory for the past two months due to LE pain  per daughter's report  ADL's / Homemaking Assistance Needed: Pt required assist for all aspects of BADLs for the past 2 mos.   Comments: Pt was discharged to SNF for 10-20 days, was unable to participate due to pain.   Went home for 1 day, was admitted to hospital, discharged to [facility] for ~ 3 days, then readmitted per daughter's report     Hand Dominance   Dominant Hand: Right    Extremity/Trunk Assessment   Upper Extremity Assessment: Defer to OT evaluation           Lower Extremity Assessment: LLE deficits/detail;RLE deficits/detail RLE Deficits / Details: grossly 3-/5 LLE Deficits / Details: grossly 2/5 hip   Cervical / Trunk Assessment: Other exceptions  Communication   Communication: Expressive difficulties (at times, doesn't make sense)  Cognition Arousal/Alertness: Awake/alert Behavior During Therapy: WFL for tasks assessed/performed Overall Cognitive Status: Impaired/Different from baseline Area of Impairment: Orientation;Attention;Memory;Following commands;Safety/judgement;Awareness;Problem solving Orientation Level: Disoriented to;Place;Time Current Attention Level: Sustained Memory: Decreased short-term memory Following Commands: Follows one step commands inconsistently (requires increased time and instruction repeated) Safety/Judgement: Decreased awareness of safety;Decreased awareness of deficits   Problem Solving: Slow processing;Decreased initiation;Difficulty sequencing;Requires tactile cues;Requires verbal cues General Comments: Pt asking if the oven is on.  He was cooperative, but with difficulty initiating activity and difficulty following commands     General Comments General comments (skin integrity, edema, etc.): daughter present during session.  Anxious about pts hospitalization.     Exercises General Exercises - Lower Extremity Ankle Circles/Pumps: AROM;5 reps;Both;Supine Long Arc Quad: AROM;Both;5 reps;Seated Heel Slides: AROM;Both;5 reps;Supine Amputee Exercises Hip ABduction/ADduction: AROM;Both;10 reps;Seated Hip Flexion/Marching: AROM;Both;5 reps;Seated      Assessment/Plan    PT Assessment Patient needs continued PT services  PT Diagnosis  Generalized weakness;Acute pain;Altered mental status   PT Problem List Decreased range of motion;Decreased activity tolerance;Decreased cognition;Decreased safety awareness;Decreased skin integrity;Pain  PT Treatment Interventions DME instruction;Gait training;Functional mobility training;Therapeutic exercise;Therapeutic activities;Patient/family education   PT Goals (Current goals can be found in the Care Plan section) Acute Rehab PT Goals Patient Stated Goal: to get stronger PT Goal Formulation: With patient/family Time For Goal Achievement: 02/19/14 Potential to Achieve Goals: Good    Frequency Min 3X/week   Barriers to discharge Decreased caregiver support      Co-evaluation PT/OT/SLP Co-Evaluation/Treatment: Yes Reason for Co-Treatment: For patient/therapist safety PT goals addressed during session: Mobility/safety with mobility OT goals addressed during session: ADL's and self-care       End of Session Equipment Utilized During Treatment: Gait belt;Oxygen Activity Tolerance: Patient limited by fatigue;Patient limited by pain Patient left: in bed;with call bell/phone within reach;with family/visitor present Nurse Communication: Mobility status;Need for lift equipment         Time: [phone] PT Time Calculation (min): 36 min   Charges:   PT Evaluation $Initial PT Evaluation Tier I: 1 Procedure PT Treatments $Therapeutic Activity: 8-22 mins   PT G Codes:          INGOLD,Blaike Newburn 01-Mar-2014, 1:16 PM [facility] [phone] (pager)

## 2014-02-05 NOTE — Progress Notes (Signed)
3 Days Post-Op  Subjective: Doing better, off pressors  Objective: Vital signs in last 24 hours: Temp:  [97.7 F (36.5 C)-98.9 F (37.2 C)] 97.7 F (36.5 C) (08/27 0404) Pulse Rate:  [92-101] 92 (08/27 0800) Resp:  [14-29] 17 (08/27 0800) BP: (74-125)/(40-67) 89/46 mmHg (08/27 0710) SpO2:  [92 %-100 %] 99 % (08/27 0800) Last BM Date: 02/04/14  Intake/Output from previous day: 08/26 0701 - 08/27 0700 In: 327.3 [I.V.:160.3; IV Piggyback:150] Out: 317 [Emesis/NG output:1; Drains:315; Stool:1] Intake/Output this shift:    General appearance: alert Resp: clear to auscultation bilaterally Cardio: regular rate and rhythm GI: soft, mild RUQ tenderness, chole drain with 400cc output overnight  Lab Results:   Recent Labs  02/04/14 0350 02/05/14 0300  WBC 23.2* 13.1*  HGB 8.9* 8.8*  HCT 27.7* 27.8*  PLT 384 334   BMET  Recent Labs  02/04/14 0350 02/05/14 0300  NA 137 139  K 4.0 3.4*  CL 98 98  CO2 23 25  GLUCOSE 111* 89  BUN 27* 16  CREATININE 6.02* 4.01*  CALCIUM 8.4 8.0*   PT/INR  Recent Labs  02/04/14 0350 02/04/14 0930  LABPROT 21.5* 21.4*  INR 1.87* 1.86*    Anti-infectives: Anti-infectives   Start     Dose/Rate Route Frequency Ordered Stop   02/04/14 1200  vancomycin (VANCOCIN) IVPB 1000 mg/200 mL premix  Status:  Discontinued     1,000 mg 200 mL/hr over 60 Minutes Intravenous Every M-W-F (Hemodialysis) 02/01/14 0819 02/04/14 0931   02/04/14 1200  vancomycin (VANCOCIN) IVPB 750 mg/150 ml premix     750 mg 150 mL/hr over 60 Minutes Intravenous Every M-W-F (Hemodialysis) 02/04/14 0931     02/03/14 1200  vancomycin (VANCOCIN) IVPB 1000 mg/200 mL premix  Status:  Discontinued     1,000 mg 200 mL/hr over 60 Minutes Intravenous  Once 02/02/14 1348 02/03/14 1610   01/31/14 1200  vancomycin (VANCOCIN) IVPB 1000 mg/200 mL premix     1,000 mg 200 mL/hr over 60 Minutes Intravenous  Once 01/29/14 1329 01/31/14 1111   01/28/14 1200  vancomycin (VANCOCIN)  IVPB 1000 mg/200 mL premix  Status:  Discontinued     1,000 mg 200 mL/hr over 60 Minutes Intravenous Every M-W-F (Hemodialysis) 01/27/14 2005 02/01/14 0819   01/28/14 0430  piperacillin-tazobactam (ZOSYN) IVPB 2.25 g     2.25 g 100 mL/hr over 30 Minutes Intravenous 3 times per day 01/27/14 2005     01/27/14 2015  vancomycin (VANCOCIN) 2,000 mg in sodium chloride 0.9 % 500 mL IVPB     2,000 mg 250 mL/hr over 120 Minutes Intravenous  Once 01/27/14 2005 01/27/14 2336   01/27/14 2015  piperacillin-tazobactam (ZOSYN) IVPB 3.375 g     3.375 g 100 mL/hr over 30 Minutes Intravenous  Once 01/27/14 2005 01/28/14 0430      Assessment/Plan: Cholecystitis - S/P perc chole drain, Start clears. On Vanc/Zosyn.  LOS: 9 days    Sean [name] 02/05/2014

## 2014-02-05 NOTE — Progress Notes (Signed)
3 Days Post-Op  Subjective: Perc Chole drain placed 8/27 Pt more alert today- still groggy   Objective: Vital signs in last 24 hours: Temp:  [97.7 F (36.5 C)-98.9 F (37.2 C)] 97.9 F (36.6 C) (08/27 0831) Pulse Rate:  [92-101] 92 (08/27 0800) Resp:  [14-29] 17 (08/27 0800) BP: (74-119)/(40-66) 96/46 mmHg (08/27 0800) SpO2:  [92 %-100 %] 99 % (08/27 0800) Last BM Date: 02/04/14  Intake/Output from previous day: 08/26 0701 - 08/27 0700 In: 327.3 [I.V.:160.3; IV Piggyback:150] Out: 317 [Emesis/NG output:1; Drains:315; Stool:1] Intake/Output this shift: Total I/O In: 10 [I.V.:10] Out: -   PE:  Afeb; vss Wbc 13.1(23.2) Site of drain clean and dry; NT Output 315 cc yesterday 30 cc in bag now- bile   Lab Results:   Recent Labs  02/04/14 0350 02/05/14 0300  WBC 23.2* 13.1*  HGB 8.9* 8.8*  HCT 27.7* 27.8*  PLT 384 334   BMET  Recent Labs  02/04/14 0350 02/05/14 0300  NA 137 139  K 4.0 3.4*  CL 98 98  CO2 23 25  GLUCOSE 111* 89  BUN 27* 16  CREATININE 6.02* 4.01*  CALCIUM 8.4 8.0*   PT/INR  Recent Labs  02/04/14 0350 02/04/14 0930  LABPROT 21.5* 21.4*  INR 1.87* 1.86*   ABG No results found for this basename: PHART, PCO2, PO2, HCO3,  in the last 72 hours  Studies/Results: Ct Head Wo Contrast  02/03/2014   CLINICAL DATA:  Mental status change.  EXAM: CT HEAD WITHOUT CONTRAST  TECHNIQUE: Contiguous axial images were obtained from the base of the skull through the vertex without intravenous contrast.  COMPARISON:  CT 01/17/2014.  12/13/2013.  FINDINGS: No intra-axial or extra-axial pathologic fluid or blood collection. Left occipital encephalomalacia. White matter changes consistent with chronic ischemia. Cerebral vascular disease. Visualized orbits and paranasal sinuses are clear. Mastoids are clear. No acute bony abnormality.  IMPRESSION: 1. Old left occipital infarct. Chronic white matter ischemic change. Diffuse atrophy. 2. No acute abnormality.   Exam stable from prior exams.   Electronically Signed   By: Marcello Moores  Register   On: 02/03/2014 12:31   Ct Abdomen Pelvis W Contrast  02/03/2014   CLINICAL DATA:  Right upper quadrant abdominal pain. Possible colonic ischemia. Patient has a history of dialysis.  EXAM: CT ABDOMEN AND PELVIS WITH CONTRAST  TECHNIQUE: Multidetector CT imaging of the abdomen and pelvis was performed using the standard protocol following bolus administration of intravenous contrast.  CONTRAST:  130mL OMNIPAQUE IOHEXOL 300 MG/ML  SOLN  COMPARISON:  12/09/2011.  FINDINGS: Gallbladder is distended with wall thickening and pericholecystic inflammation/edema. There are dependent small stones. Findings are consistent with acute cholecystitis given history.  Heart is mild to moderately enlarged. There is dependent subsegmental atelectasis.  Liver and spleen are unremarkable. Common bile duct is normal in caliber for age. No pancreatic masses or inflammatory change. No adrenal masses.  Diffuse bilateral renal cortical thinning. Multiple low-density renal masses consistent with cysts. No hydronephrosis. Normal ureters. Bladder is decompressed.  No pathologically enlarged lymph nodes.  No ascites.  There are atherosclerotic vascular calcifications throughout the aorta and its branch vessels. Significant stenosis is noted throughout the superior mesenteric a arteries/celiac axis. The superior mesenteric artery and celiac axis appear to originate from a common trunk from the aorta.  There are changes from previous right colon surgery. There is no bowel wall thickening or bowel wall air. No evidence of colonic or small bowel ischemia.  There is a small anterior abdominal  wall fat containing hernia adjacent to a midline incision.  Bone show changes consistent with renal osteodystrophy. There are prominent subchondral cysts along both superior acetabula.  IMPRESSION: 1. Acute cholecystitis.  No evidence of ischemic bowel. 2. Multiple chronic findings  as detailed. No other evidence of an acute abnormality.   Electronically Signed   By: Lajean Manes M.D.   On: 02/03/2014 12:35   Ir Perc Cholecystostomy  02/04/2014   CLINICAL DATA:  Acute cholecystitis, sepsis, percutaneous drainage requested by surgery.  EXAM: PERCUTANEOUS CHOLECYSTOSTOMY TUBE PLACEMENT WITH ULTRASOUND AND FLUOROSCOPIC GUIDANCE:  FLUOROSCOPY TIME:  1 min 12  TECHNIQUE: The procedure, risks (including but not limited to bleeding, infection, organ damage ), benefits, and alternatives were explained to the family. Questions regarding the procedure were encouraged and answered. The family understands and consents to the procedure. Survey ultrasound of the abdomen was performed and an appropriate skin entry site was identified. Skin site was marked, prepped with Betadine, and draped in usual sterile fashion, and infiltrated locally with 1% lidocaine.  Intravenous Fentanyl and Versed were administered as conscious sedation during continuous cardiorespiratory monitoring by the radiology RN, with a total moderate sedation time of 7 minutes.  Under real-time ultrasound guidance, gallbladder was accessed using a transhepatic approach with a 21-gauge needle. Ultrasound image documentation was saved. Bile could be aspirated through the hub. Needle was exchanged over a 018 guidewire for transitional dilator which allowed placement of 035 J wire. Over this, a 10.2 French pigtail catheter was advanced and formed centrally in the gallbladder lumen. 20 mL of dark bile were aspirated, sent for culture. Small contrast injection confirmed appropriate position. Catheter secured externally with 0 Prolene suture and placed to external drain bag. Patient tolerated the procedure well, with no immediate complication.  IMPRESSION: 1. Technically successful percutaneous cholecystostomy tube placement with ultrasound and fluoroscopic guidance.   Electronically Signed   By: Arne Cleveland M.D.   On: 02/04/2014 14:53    Dg Chest Port 1 View  02/03/2014   CLINICAL DATA:  Assess airspace disease  EXAM: PORTABLE CHEST - 1 VIEW  COMPARISON:  01/27/2014  FINDINGS: Mild enlargement of the cardiac silhouette, stable. The aorta is uncoiled. No mediastinal or hilar masses.  Mild hazy left lung base opacity, most likely atelectasis. Infiltrate is possible. No pulmonary edema. No pleural effusion or pneumothorax.  Right central venous line has its tip near the caval atrial junction, well positioned.  IMPRESSION: Mild hazy opacity at the left lung base. This is likely atelectasis. A small pneumonia is possible. This is similar to opacity noted on the prior study, which supports atelectasis. Lungs otherwise clear.   Electronically Signed   By: Lajean Manes M.D.   On: 02/03/2014 12:54    Anti-infectives: Anti-infectives   Start     Dose/Rate Route Frequency Ordered Stop   02/04/14 1200  vancomycin (VANCOCIN) IVPB 1000 mg/200 mL premix  Status:  Discontinued     1,000 mg 200 mL/hr over 60 Minutes Intravenous Every M-W-F (Hemodialysis) 02/01/14 0819 02/04/14 0931   02/04/14 1200  vancomycin (VANCOCIN) IVPB 750 mg/150 ml premix     750 mg 150 mL/hr over 60 Minutes Intravenous Every M-W-F (Hemodialysis) 02/04/14 0931     02/03/14 1200  vancomycin (VANCOCIN) IVPB 1000 mg/200 mL premix  Status:  Discontinued     1,000 mg 200 mL/hr over 60 Minutes Intravenous  Once 02/02/14 1348 02/03/14 1610   01/31/14 1200  vancomycin (VANCOCIN) IVPB 1000 mg/200 mL premix  1,000 mg 200 mL/hr over 60 Minutes Intravenous  Once 01/29/14 1329 01/31/14 1111   01/28/14 1200  vancomycin (VANCOCIN) IVPB 1000 mg/200 mL premix  Status:  Discontinued     1,000 mg 200 mL/hr over 60 Minutes Intravenous Every M-W-F (Hemodialysis) 01/27/14 2005 02/01/14 0819   01/28/14 0430  piperacillin-tazobactam (ZOSYN) IVPB 2.25 g     2.25 g 100 mL/hr over 30 Minutes Intravenous 3 times per day 01/27/14 2005     01/27/14 2015  vancomycin (VANCOCIN) 2,000 mg in  sodium chloride 0.9 % 500 mL IVPB     2,000 mg 250 mL/hr over 120 Minutes Intravenous  Once 01/27/14 2005 01/27/14 2336   01/27/14 2015  piperacillin-tazobactam (ZOSYN) IVPB 3.375 g     3.375 g 100 mL/hr over 30 Minutes Intravenous  Once 01/27/14 2005 01/28/14 0430      Assessment/Plan: s/p Percutaneous cholecystostomy drain placed 8/27 Doing well Will follow few days Will need to remain in place 4-6 weeks---unless to [place] per CCS   LOS: 9 days    Xan Sparkman A 02/05/2014

## 2014-02-05 NOTE — Progress Notes (Addendum)
  [facility] Progress Note  02/05/2014 7:36 AM POD 3  Subjective:  Alert this morning. No abdominal pain. No other complaints.   Tmax 98.9 BP sys 70s-120s 02 98% RA   Filed Vitals:   02/05/14 0710  BP: 89/46  Pulse: 93  Temp:   Resp: 22    Physical Exam: Incisions:  Left AKA staple line clean dry and intact. No drainage, no erythema, no edema.   CBC    Component Value Date/Time   WBC 13.1* 02/05/2014 0300   RBC 3.27* 02/05/2014 0300   RBC 3.67* 09/19/2011 1356   HGB 8.8* 02/05/2014 0300   HCT 27.8* 02/05/2014 0300   PLT 334 02/05/2014 0300   MCV 85.0 02/05/2014 0300   MCH 26.9 02/05/2014 0300   MCHC 31.7 02/05/2014 0300   RDW 18.1* 02/05/2014 0300   LYMPHSABS 0.9 01/27/2014 2100   MONOABS 1.8* 01/27/2014 2100   EOSABS 0.1 01/27/2014 2100   BASOSABS 0.0 01/27/2014 2100    BMET    Component Value Date/Time   NA 139 02/05/2014 0300   K 3.4* 02/05/2014 0300   CL 98 02/05/2014 0300   CO2 25 02/05/2014 0300   GLUCOSE 89 02/05/2014 0300   BUN 16 02/05/2014 0300   CREATININE 4.01* 02/05/2014 0300   CALCIUM 8.0* 02/05/2014 0300   GFRNONAA 13* 02/05/2014 0300   GFRAA 15* 02/05/2014 0300    INR    Component Value Date/Time   INR 1.86* 02/04/2014 0930     Intake/Output Summary (Last 24 hours) at 02/05/14 0736 Last data filed at 02/05/14 0700  Gross per 24 hour  Intake 327.28 ml  Output    317 ml  Net  10.28 ml     Assessment/Plan:  78 y.o. male is s/p left above knee amputation  POD 3  -Left AKA stump healing well. No signs of infection. Will order retention sock. Continue daily dressing changes. -Leukocytosis: trending down to 13.1 today.  -Advance diet per general surgery. Appreciate nutrition recommendations.     Virgina Jock, PA-C [facility] Office: [phone] Pager: [phone] 02/05/2014 7:36 AM  POD 3 Leukocytosis improved AKA healing Will recheck next week. Call if questions.  Ruta Hinds, MD [facility] Office: [phone] Pager: [phone]

## 2014-02-05 NOTE — Evaluation (Signed)
Occupational Therapy Evaluation Patient Details Name: Sean [name] MRN: [id number] DOB: [birth year] Today's Date: 02/05/2014    History of Present Illness Sean [name] is a 78 y.o. male, with past medical history significant for end-stage renal disease with hyperparathyroidism and severe calciphylaxis admitted 11/27/13 for evaluation of an advanced left leg wound . The patient was admitted and treated for right leg cellulitis and sepsis and was discharged 8 days PTA.   Pt.  underwent Lt. AKA 8/24.  Pt devoloped hypotension and AMS 8/24; CT abd/pelvis showed acute cholecystitis; CT head old left occipital infarct with chronic white matter ischemic changes.  No acute event.  8/25 pt deemed poor surgical candidate for cholecystitis so percutaneous cholecystotomy tube was placed 8/26.  Pt weaned off pressors on 8/26.  PMH includes:  stroke; MI; peripheral neuropathy; depression   Clinical Impression   Pt admitted with above. He demonstrates the below listed deficits and will benefit from continued OT to maximize safety and independence with BADLs.  Currently, he requires mod - total A with BADLs.  He was able to tolerate EOB sitting, but unable to progress to EOB due to rt. Heel pain.  BP supine 96/75 (79); EOB 101/61 (70); supine at end of session 90/52 (61).  HR 93; 02 sats 98%. Recommend SNF      Follow Up Recommendations  SNF    Equipment Recommendations  None recommended by OT    Recommendations for Other Services       Precautions / Restrictions Precautions Precautions: Fall      Mobility Bed Mobility Overal bed mobility: +2 for physical assistance Bed Mobility: Supine to Sit;Sit to Supine Rolling: Max assist;+2 for physical assistance   Supine to sit: Max assist;+2 for physical assistance Sit to supine: Max assist;+2 for physical assistance   General bed mobility comments: Pt requires assist with moving LEs to EOB and to lift UB/trunk from bed.  Pt initially with pain  Lt. LE, but that improved once hips scooted fully to EOB   Transfers                 General transfer comment: unable due to LE pain     Balance Overall balance assessment: Needs assistance Sitting-balance support: Feet supported Sitting balance-Leahy Scale: Poor Sitting balance - Comments: initially required max A with pt leaning posteriorly and to the Rt, but with facilitation, pt able to maintain EOB sitting with min guard assist with bil. UEs supported on bedside table  Postural control: Posterior lean;Right lateral lean                                  ADL Overall ADL's : Needs assistance/impaired Eating/Feeding: Minimal assistance;Bed level   Grooming: Wash/dry hands;Wash/dry face;Oral care;Minimal assistance;Bed level   Upper Body Bathing: Moderate assistance;Bed level   Lower Body Bathing: Total assistance;Bed level   Upper Body Dressing : Maximal assistance;Bed level   Lower Body Dressing: Total assistance;Bed level   Toilet Transfer: Total assistance (unable)   Toileting- Clothing Manipulation and Hygiene: Total assistance;Bed level       Functional mobility during ADLs: +2 for physical assistance;Maximal assistance (for bed mobility only )       Vision                     Perception     Praxis      Pertinent Vitals/Pain Pain Assessment: Faces Faces Pain Scale:  Hurts even more Pain Location: Lt. LE; Rt heel with movement  Pain Descriptors / Indicators: Aching Pain Intervention(s): Limited activity within patient's tolerance;Monitored during session;Repositioned;Premedicated before session;Relaxation     Hand Dominance Right   Extremity/Trunk Assessment Upper Extremity Assessment Upper Extremity Assessment: Generalized weakness   Lower Extremity Assessment Lower Extremity Assessment: Defer to PT evaluation   Cervical / Trunk Assessment Cervical / Trunk Assessment: Other exceptions Cervical / Trunk Exceptions: trunk  weakness.  Pt. maintains flexed trunk Lt. posterior rib cage flared   Communication Communication Communication: Expressive difficulties (at times, doesn't make sense)   Cognition Arousal/Alertness: Awake/alert Behavior During Therapy: WFL for tasks assessed/performed Overall Cognitive Status: Impaired/Different from baseline Area of Impairment: Orientation;Attention;Memory;Following commands;Safety/judgement;Awareness;Problem solving Orientation Level: Disoriented to;Place;Time Current Attention Level: Sustained Memory: Decreased short-term memory Following Commands: Follows one step commands inconsistently (requires increased time and instruction repeated) Safety/Judgement: Decreased awareness of safety;Decreased awareness of deficits   Problem Solving: Slow processing;Decreased initiation;Difficulty sequencing;Requires tactile cues;Requires verbal cues General Comments: Pt asking if the oven is on.  He was cooperative, but with difficulty initiating activity and difficulty following commands    General Comments       Exercises       Shoulder Instructions      Home Living Family/patient expects to be discharged to:: Skilled nursing facility Living Arrangements: Alone   Type of Home: House Home Access: Stairs to enter     Home Layout: One level               Home Equipment: [employer] - single point;Wheelchair - [employer] - 4 wheels          Prior Functioning/Environment Level of Independence: Needs assistance  Gait / Transfers Assistance Needed: Pt was ambulatory with RW ~ 2 mos prior to admit.  He has not been ambulatory for the past two months due to LE pain  per daughter's report ADL's / Homemaking Assistance Needed: Pt required assist for all aspects of BADLs for the past 2 mos.    Comments: Pt was discharged to SNF for 10-20 days, was unable to participate due to pain.  Went home for 1 day, was admitted to hospital, discharged to [facility] for ~ 3 days, then  readmitted per daughter's report    OT Diagnosis: Generalized weakness;Acute pain;Cognitive deficits   OT Problem List: Decreased strength;Decreased activity tolerance;Impaired balance (sitting and/or standing);Decreased cognition;Decreased safety awareness;Decreased knowledge of use of DME or AE;Cardiopulmonary status limiting activity;Impaired sensation;Pain   OT Treatment/Interventions: Self-care/ADL training;DME and/or AE instruction;Therapeutic activities;Patient/family education;Balance training;Therapeutic exercise    OT Goals(Current goals can be found in the care plan section) Acute Rehab OT Goals Patient Stated Goal: to get stronger OT Goal Formulation: With patient/family Time For Goal Achievement: 02/23/14 Potential to Achieve Goals: Fair ADL Goals Pt Will Perform Grooming: with min guard assist;sitting (EOB) Pt Will Perform Upper Body Bathing: with min guard assist;sitting (EOB) Pt Will Perform Upper Body Dressing: with min guard assist;sitting (EOB) Pt Will Transfer to Toilet: with mod assist;with +2 assist;squat pivot transfer;bedside commode  OT Frequency: Min 2X/week   Barriers to D/C: Decreased caregiver support          Co-evaluation PT/OT/SLP Co-Evaluation/Treatment: Yes Reason for Co-Treatment: For patient/therapist safety;Complexity of the patient's impairments (multi-system involvement)   OT goals addressed during session: ADL's and self-care      End of Session Nurse Communication: Mobility status;Need for lift equipment  Activity Tolerance: Patient tolerated treatment well Patient left: in bed;with call bell/phone within reach;with family/visitor present  Time: 3794-3276 OT Time Calculation (min): 36 min Charges:  OT General Charges $OT Visit: 1 Procedure OT Evaluation $Initial OT Evaluation Tier I: 1 Procedure OT Treatments $Therapeutic Activity: 8-22 mins G-Codes:    Tedra Coppernoll M 02/18/14, 1:04 PM

## 2014-02-06 DIAGNOSIS — K810 Acute cholecystitis: Secondary | ICD-10-CM

## 2014-02-06 LAB — CBC
HEMATOCRIT: 28.9 % — AB (ref 39.0–52.0)
Hemoglobin: 9.3 g/dL — ABNORMAL LOW (ref 13.0–17.0)
MCH: 27 pg (ref 26.0–34.0)
MCHC: 32.2 g/dL (ref 30.0–36.0)
MCV: 83.8 fL (ref 78.0–100.0)
Platelets: 324 10*3/uL (ref 150–400)
RBC: 3.45 MIL/uL — ABNORMAL LOW (ref 4.22–5.81)
RDW: 18.3 % — AB (ref 11.5–15.5)
WBC: 11.4 10*3/uL — AB (ref 4.0–10.5)

## 2014-02-06 LAB — GLUCOSE, CAPILLARY
GLUCOSE-CAPILLARY: 97 mg/dL (ref 70–99)
Glucose-Capillary: 113 mg/dL — ABNORMAL HIGH (ref 70–99)
Glucose-Capillary: 137 mg/dL — ABNORMAL HIGH (ref 70–99)
Glucose-Capillary: 82 mg/dL (ref 70–99)

## 2014-02-06 LAB — BASIC METABOLIC PANEL
Anion gap: 14 (ref 5–15)
BUN: 23 mg/dL (ref 6–23)
CALCIUM: 8 mg/dL — AB (ref 8.4–10.5)
CHLORIDE: 98 meq/L (ref 96–112)
CO2: 24 mEq/L (ref 19–32)
CREATININE: 5.09 mg/dL — AB (ref 0.50–1.35)
GFR calc Af Amer: 12 mL/min — ABNORMAL LOW (ref >90)
GFR calc non Af Amer: 10 mL/min — ABNORMAL LOW (ref >90)
GLUCOSE: 110 mg/dL — AB (ref 70–99)
Potassium: 3.5 mEq/L — ABNORMAL LOW (ref 3.7–5.3)
SODIUM: 136 meq/L — AB (ref 137–147)

## 2014-02-06 LAB — HEMOGLOBIN A1C
HEMOGLOBIN A1C: 5.9 % — AB (ref <5.7)
Mean Plasma Glucose: 123 mg/dL — ABNORMAL HIGH (ref <117)

## 2014-02-06 MED ORDER — SODIUM CHLORIDE 0.9 % IV SOLN: 100.0000 mL | INTRAVENOUS | Status: DC | PRN

## 2014-02-06 MED ORDER — ALTEPLASE 2 MG IJ SOLR
2.0000 mg | Freq: Once | INTRAMUSCULAR | Status: DC | PRN
  Filled 2014-02-06: qty 2

## 2014-02-06 MED ORDER — HEPARIN SODIUM (PORCINE) 1000 UNIT/ML DIALYSIS: 1000.0000 [IU] | INTRAMUSCULAR | Status: DC | PRN

## 2014-02-06 MED ORDER — PENTAFLUOROPROP-TETRAFLUOROETH EX AERO: 1.0000 "application " | INHALATION_SPRAY | CUTANEOUS | Status: DC | PRN

## 2014-02-06 MED ORDER — LIDOCAINE HCL (PF) 1 % IJ SOLN: 5.0000 mL | INTRAMUSCULAR | Status: DC | PRN

## 2014-02-06 MED ORDER — ASPIRIN 81 MG PO CHEW
81.0000 mg | CHEWABLE_TABLET | Freq: Every day | ORAL | Status: DC
  Administered 2014-02-06 – 2014-02-08 (×3): 81 mg via ORAL
  Filled 2014-02-06 (×3): qty 1

## 2014-02-06 MED ORDER — MORPHINE SULFATE 2 MG/ML IJ SOLN
2.0000 mg | INTRAMUSCULAR | Status: DC | PRN
  Administered 2014-02-06 – 2014-02-07 (×5): 2 mg via INTRAVENOUS
  Filled 2014-02-06 (×5): qty 1

## 2014-02-06 MED ORDER — LIDOCAINE-PRILOCAINE 2.5-2.5 % EX CREA: 1.0000 "application " | TOPICAL_CREAM | CUTANEOUS | Status: DC | PRN

## 2014-02-06 MED ORDER — HEPARIN SODIUM (PORCINE) 1000 UNIT/ML DIALYSIS: 2000.0000 [IU] | INTRAMUSCULAR | Status: DC | PRN

## 2014-02-06 MED ORDER — NEPRO/CARBSTEADY PO LIQD
237.0000 mL | ORAL | Status: DC | PRN
  Filled 2014-02-06: qty 237

## 2014-02-06 NOTE — Progress Notes (Signed)
Patient discussed with Crawford Givens, LCSW as a transferred patient to [facility].  Patient has been referred for SNF placement with preference for [facility]. May need long term care.  Bed search is in place.  Lorie Phenix. Pauline Good, [place]

## 2014-02-06 NOTE — Progress Notes (Signed)
Patient examined and I agree with the assessment and plan  Georganna Skeans, MD, MPH, FACS Trauma: [phone] General Surgery: [phone]  02/06/2014 1:22 PM

## 2014-02-06 NOTE — Progress Notes (Signed)
Physical Therapy Treatment Patient Details Name: Sean [name] MRN: [id number] DOB: 06/05/1936 Today's Date: 02/06/2014    History of Present Illness Azari Hasler is a 78 y.o. male, with past medical history significant for end-stage renal disease with hyperparathyroidism and severe calciphylaxis admitted 11/27/13 for evaluation of an advanced left leg wound . The patient was admitted and treated for right leg cellulitis and sepsis and was discharged 8 days PTA.   Pt.  underwent Lt. AKA 8/24.  Pt devoloped hypotension and AMS 8/24; CT abd/pelvis showed acute cholecystitis; CT head old left occipital infarct with chronic white matter ischemic changes.  No acute event.  8/25 pt deemed poor surgical candidate for cholecystitis so percutaneous cholecystotomy tube was placed 8/26.  Pt weaned off pressors on 8/26.  PMH includes:  stroke; MI; peripheral neuropathy; depression    PT Comments    Pt progressing towards physical therapy goals. Requiring +2 assist for lateral transfers mainly due to pain and difficulty following commands - feel partly due to anxiety of anticipating pain. Will continue to follow.   Follow Up Recommendations  SNF     Equipment Recommendations   (TBD)    Recommendations for Other Services       Precautions / Restrictions Precautions Precautions: Fall Precaution Comments: [place]    Mobility  Bed Mobility Overal bed mobility: +2 for physical assistance Bed Mobility: Supine to Sit Rolling: Mod assist   Supine to sit: Mod assist;+2 for physical assistance     General bed mobility comments: Pt requires assist with moving LEs to EOB and to lift UB/trunk from bed.  Pt initially with pain Lt. LE, but that improved once hips scooted fully to EOB   Transfers Overall transfer level: Needs assistance Equipment used: None Transfers: Lateral/Scoot Transfers          Lateral/Scoot Transfers: Max assist;+2 physical assistance General transfer comment: Bed pad  used to assist pt in lateral scooting bed to drop-arm recliner. Pt with difficulty following commands for sequencing and anxious due to pain.   Ambulation/Gait                 Stairs            Wheelchair Mobility    Modified Rankin (Stroke Patients Only)       Balance Overall balance assessment: Needs assistance Sitting-balance support: Feet supported;Bilateral upper extremity supported Sitting balance-Leahy Scale: Poor   Postural control: Posterior lean                          Cognition Arousal/Alertness: Awake/alert Behavior During Therapy: WFL for tasks assessed/performed Overall Cognitive Status: Impaired/Different from baseline Area of Impairment: Orientation;Attention;Memory;Following commands;Safety/judgement;Awareness;Problem solving Orientation Level: Disoriented to;Place;Time Current Attention Level: Sustained Memory: Decreased short-term memory Following Commands: Follows one step commands inconsistently Safety/Judgement: Decreased awareness of safety;Decreased awareness of deficits Awareness: Emergent Problem Solving: Slow processing;Decreased initiation;Difficulty sequencing;Requires tactile cues;Requires verbal cues General Comments: Overall cooperative however difficulty following commands during mobility.    Exercises      General Comments        Pertinent Vitals/Pain Pain Assessment: 0-10 Pain Score: 7  Pain Location: LLE Pain Descriptors / Indicators: Aching Pain Intervention(s): Limited activity within patient's tolerance;Monitored during session;Premedicated before session    Home Living                      Prior Function            PT  Goals (current goals can now be found in the care plan section) Acute Rehab PT Goals Patient Stated Goal: to get stronger PT Goal Formulation: With patient/family Time For Goal Achievement: 02/19/14 Potential to Achieve Goals: Good Progress towards PT goals: Progressing  toward goals    Frequency  Min 3X/week    PT Plan Current plan remains appropriate    Co-evaluation             End of Session Equipment Utilized During Treatment: Gait belt Activity Tolerance: Patient limited by pain;Patient limited by fatigue Patient left: in chair;with call bell/phone within reach;with nursing/sitter in room     Time: 8828-0034 PT Time Calculation (min): 25 min  Charges:  $Therapeutic Activity: 23-37 mins                    G Codes:      Jolyn Lent 02-22-2014, 4:17 PM Jolyn Lent, PT, DPT Acute Rehabilitation Services Pager: [phone]

## 2014-02-06 NOTE — Progress Notes (Signed)
PT Cancellation Note  Patient Details Name: JAYVIN HURRELL MRN: [id number] DOB: 09/11/1935   Cancelled Treatment:    Reason Eval/Treat Not Completed: Patient at procedure or test/unavailable. Pt in HD for the morning. Will continue to follow and check back as time allows.    Jolyn Lent 02/06/2014, 9:03 AM  Jolyn Lent, PT, DPT Acute Rehabilitation Services Pager: [phone]

## 2014-02-06 NOTE — Progress Notes (Addendum)
[facility] PROGRESS NOTE  Sean [name] EPP:[id number] DOB: Oct 17, 1935 DOA: 01/27/2014 PCP: Philis Fendt, MD  PCCM transfer 8/28 Septic shock required pressors, ESRD, new AKA and per chole drain for Acute cholecystitis  Assessment/Plan: 1. Septic shock -improved off pressors -due to acute cholecystitis -s/p perc chole drain 8/27 -CCS following, diet advanced to low fat today -blood Cx negative -continue IV Zosyn, could transition to CIpro/Flagyl at DC  2. 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 Elevated Troponin -seen by Cards, felt to be demand ischemia -start ASA, continue statin, add low dose BB when BP tolerates  4. ESRD on HD  -HD per Renal  5. Anemia of chronic disease -cont aranesp  6. Secondary hyperparathyroidism  7. DM -CBGs 90-110s, check hbaic  8.Hx old CVA / underlying mild dementia   9. CAD - EF 25% by Myoview last admit July, no ischemia , cath was recommended but pt declined  DVT proph: Hep SQ  Code Status: Full Code Family Communication: none at bedside Disposition Plan: will need SNF   Consultants:  VVS  PCCM  CCS  Antibiotics:  zOSYN  HPI/Subjective: Feels ok, no complaints  Objective: Filed Vitals:   02/06/14 1057  BP: 122/81  Pulse: 69  Temp: 97.5 F (36.4 C)  Resp: 18    Intake/Output Summary (Last 24 hours) at 02/06/14 1414 Last data filed at 02/06/14 1229  Gross per 24 hour  Intake    760 ml  Output   1125 ml  Net   -365 ml   Filed Weights   02/06/14 0432 02/06/14 0714 02/06/14 1057  Weight: 84.7 kg (186 lb 11.7 oz) 83.8 kg (184 lb 11.9 oz) 82.9 kg (182 lb 12.2 oz)    Exam:   General:  AAOx3, chronically ill appearing  Cardiovascular: S1S2/RRR  Respiratory: CTAB  Abdomen: soft, NT, Drain in RUQ, BS present  Musculoskeletal: L AKA   Data Reviewed: Basic Metabolic Panel:  Recent Labs Lab 02/01/14 0722  02/03/14 0711 02/04/14 0350 02/05/14 0300 02/06/14 0500  NA 137 137 137 139 136*  K 4.1 4.6 4.0 3.4* 3.5*  CL 96 97 98 98 98  CO2 29 25 23 25 24   GLUCOSE 153* 121* 111* 89 110*  BUN 8 22 27* 16 23  CREATININE 2.72* 5.31* 6.02* 4.01* 5.09*  CALCIUM 8.4 8.3* 8.4 8.0* 8.0*  MG  --   --  1.9  --   --   PHOS  --  3.5 4.0  --   --    Liver Function Tests:  Recent Labs Lab 02/03/14 0711 02/04/14 0350 02/05/14 0300  AST  --  36 38*  ALT  --  <5 <5  ALKPHOS  --  88 86  BILITOT  --  0.7 0.5  PROT  --  6.3 5.8*  ALBUMIN 1.4* 1.3* 1.2*   No results found for this basename: LIPASE, AMYLASE,  in the last 168 hours No results found for this basename: AMMONIA,  in the last 168 hours CBC:  Recent Labs Lab 02/03/14 0711 02/03/14 1043 02/03/14 2257 02/04/14 0350 02/05/14 0300 02/06/14 0500  WBC 20.9* 21.1*  --  23.2* 13.1* 11.4*  HGB 7.4* 6.0* 8.8* 8.9* 8.8* 9.3*  HCT 24.4* 19.3* 27.2* 27.7* 27.8* 28.9*  MCV 87.1 85.8  --  84.2 85.0 83.8  PLT 402* 350  --  384 334 324   Cardiac Enzymes:  Recent Labs  Lab 02/03/14 1030 02/03/14 1540 02/03/14 2257  CKTOTAL 157  --   --   CKMB 3.2  --   --   TROPONINI 1.48* 2.58* 4.39*   BNP (last 3 results)  Recent Labs  12/13/13 1408 01/17/14 0800  PROBNP >70000.0* >70000.0*   CBG:  Recent Labs Lab 02/05/14 1556 02/05/14 2106 02/06/14 0016 02/06/14 0439 02/06/14 1218  GLUCAP 125* 174* 137* 113* 82    Recent Results (from the past 240 hour(s))  CULTURE, BLOOD (ROUTINE X 2)     Status: None   Collection Time    01/31/14  4:28 PM      Result Value Ref Range Status   Specimen Description BLOOD RIGHT HAND   Final   Special Requests BOTTLES DRAWN AEROBIC AND ANAEROBIC 10 CC   Final   Culture  Setup Time     Final   Value: 02/01/2014 00:09     Performed at [facility]   Culture     Final   Value:        BLOOD CULTURE RECEIVED NO GROWTH TO DATE CULTURE WILL BE HELD FOR 5 DAYS BEFORE ISSUING A FINAL NEGATIVE REPORT      Performed at [facility]   Report Status PENDING   Incomplete  CULTURE, BLOOD (ROUTINE X 2)     Status: None   Collection Time    02/01/14  7:22 AM      Result Value Ref Range Status   Specimen Description BLOOD RIGHT HAND   Final   Special Requests BOTTLES DRAWN AEROBIC AND ANAEROBIC 5CC EACH   Final   Culture  Setup Time     Final   Value: 02/01/2014 17:45     Performed at [facility]   Culture     Final   Value:        BLOOD CULTURE RECEIVED NO GROWTH TO DATE CULTURE WILL BE HELD FOR 5 DAYS BEFORE ISSUING A FINAL NEGATIVE REPORT     Performed at [facility]   Report Status PENDING   Incomplete  SURGICAL PCR SCREEN     Status: Abnormal   Collection Time    02/02/14  5:04 AM      Result Value Ref Range Status   MRSA, PCR NEGATIVE  NEGATIVE Final   Staphylococcus aureus POSITIVE (*) NEGATIVE Final   Comment:            The Xpert SA Assay (FDA     approved for NASAL specimens     in patients over 69 years of age),     is one component of     a comprehensive surveillance     program.  Test performance has     been validated by [employer] for patients greater     than or equal to 53 year old.     It is not intended     to diagnose infection nor to     guide or monitor treatment.  MRSA PCR SCREENING     Status: None   Collection Time    02/03/14 10:47 AM      Result Value Ref Range Status   MRSA by PCR NEGATIVE  NEGATIVE Final   Comment:            The GeneXpert MRSA Assay (FDA     approved for NASAL specimens     only), is one component of a     comprehensive MRSA  colonization     surveillance program. It is not     intended to diagnose MRSA     infection nor to guide or     monitor treatment for     MRSA infections.  BODY FLUID CULTURE     Status: None   Collection Time    02/04/14  2:42 PM      Result Value Ref Range Status   Specimen Description FLUID GALL BLADDER   Final   Special Requests NONE   Final   Gram Stain     Final    Value: NO WBC SEEN     NO ORGANISMS SEEN     Performed at [facility]   Culture     Final   Value: NO GROWTH 2 DAYS     Performed at [facility]   Report Status PENDING   Incomplete     Studies: Ir Perc Cholecystostomy  02/04/2014   CLINICAL DATA:  Acute cholecystitis, sepsis, percutaneous drainage requested by surgery.  EXAM: PERCUTANEOUS CHOLECYSTOSTOMY TUBE PLACEMENT WITH ULTRASOUND AND FLUOROSCOPIC GUIDANCE:  FLUOROSCOPY TIME:  1 min 12  TECHNIQUE: The procedure, risks (including but not limited to bleeding, infection, organ damage ), benefits, and alternatives were explained to the family. Questions regarding the procedure were encouraged and answered. The family understands and consents to the procedure. Survey ultrasound of the abdomen was performed and an appropriate skin entry site was identified. Skin site was marked, prepped with Betadine, and draped in usual sterile fashion, and infiltrated locally with 1% lidocaine.  Intravenous Fentanyl and Versed were administered as conscious sedation during continuous cardiorespiratory monitoring by the radiology RN, with a total moderate sedation time of 7 minutes.  Under real-time ultrasound guidance, gallbladder was accessed using a transhepatic approach with a 21-gauge needle. Ultrasound image documentation was saved. Bile could be aspirated through the hub. Needle was exchanged over a 018 guidewire for transitional dilator which allowed placement of 035 J wire. Over this, a 10.2 French pigtail catheter was advanced and formed centrally in the gallbladder lumen. 20 mL of dark bile were aspirated, sent for culture. Small contrast injection confirmed appropriate position. Catheter secured externally with 0 Prolene suture and placed to external drain bag. Patient tolerated the procedure well, with no immediate complication.  IMPRESSION: 1. Technically successful percutaneous cholecystostomy tube placement with ultrasound and  fluoroscopic guidance.   Electronically Signed   By: Arne Cleveland M.D.   On: 02/04/2014 14:53    Scheduled Meds: . antiseptic oral rinse  7 mL Mouth Rinse q12n4p  . atorvastatin  20 mg Oral q1800  . chlorhexidine  15 mL Mouth Rinse BID  . darbepoetin (ARANESP) injection - DIALYSIS  150 mcg Intravenous Q Wed-HD  . ferric gluconate (FERRLECIT/NULECIT) IV  62.5 mg Intravenous Q Wed-HD  . heparin  5,000 Units Subcutaneous 3 times per day  . piperacillin-tazobactam (ZOSYN)  IV  2.25 g Intravenous 3 times per day  . pneumococcal 23 valent vaccine  0.5 mL Intramuscular Tomorrow-1000  . sodium chloride  10 mL Intravenous Q12H  . sodium chloride  10 mL Intracatheter Q8H   Continuous Infusions: . sodium chloride Stopped (02/05/14 1600)   Antibiotics Given (last 72 hours)   Date/Time Action Medication Dose Rate   02/03/14 2155 Given  [Medication unavailable; pharmacy notified at 20:00.]   piperacillin-tazobactam (ZOSYN) IVPB 2.25 g 2.25 g 100 mL/hr   02/04/14 0422 Given   piperacillin-tazobactam (ZOSYN) IVPB 2.25 g 2.25 g 100 mL/hr  02/04/14 1103 Given  [given in HD]   vancomycin (VANCOCIN) IVPB 750 mg/150 ml premix 750 mg 150 mL/hr   02/04/14 1307 Given   piperacillin-tazobactam (ZOSYN) IVPB 2.25 g 2.25 g 100 mL/hr   02/04/14 1941 Given   piperacillin-tazobactam (ZOSYN) IVPB 2.25 g 2.25 g 100 mL/hr   02/05/14 0429 Given   piperacillin-tazobactam (ZOSYN) IVPB 2.25 g 2.25 g 100 mL/hr   02/05/14 1236 Given   piperacillin-tazobactam (ZOSYN) IVPB 2.25 g 2.25 g 100 mL/hr   02/05/14 2208 Given   piperacillin-tazobactam (ZOSYN) IVPB 2.25 g 2.25 g 100 mL/hr   02/06/14 5809 Given   piperacillin-tazobactam (ZOSYN) IVPB 2.25 g 2.25 g 100 mL/hr   02/06/14 1220 Given   piperacillin-tazobactam (ZOSYN) IVPB 2.25 g 2.25 g 100 mL/hr      Principal Problem:   Open leg wound Active Problems:   Septic shock   Demand ischemia   Septic shock(785.52)   Cholecystitis   NSTEMI (non-ST elevated  myocardial infarction)   Acute blood loss anemia   Malnutrition of moderate degree   Severe sepsis(995.92)    Time spent: 82min    [facility] Pager [phone]. If 7PM-7AM, please contact night-coverage at [email or web address], password [facility] 02/06/2014, 2:14 PM  LOS: 10 days

## 2014-02-06 NOTE — Progress Notes (Signed)
Patient ID: Sean [name], male   DOB: [birth year], 78 y.o.   MRN: [id number] 4 Days Post-Op  Subjective: Pt seen in HD.  Feels well.  No abdominal pain.  Tolerating liquids  Objective: Vital signs in last 24 hours: Temp:  [98.1 F (36.7 C)-98.5 F (36.9 C)] 98.1 F (36.7 C) (08/28 0714) Pulse Rate:  [72-94] 87 (08/28 1000) Resp:  [14-22] 17 (08/28 0714) BP: (87-110)/(26-88) 109/88 mmHg (08/28 1000) SpO2:  [95 %-99 %] 95 % (08/28 0714) Weight:  [184 lb 3.2 oz (83.553 kg)-186 lb 11.7 oz (84.7 kg)] 184 lb 11.9 oz (83.8 kg) (08/28 0714) Last BM Date: 02/05/14  Intake/Output from previous day: 08/27 0701 - 08/28 0700 In: 860 [P.O.:620; I.V.:90; IV Piggyback:150] Out: 300 [Drains:300] Intake/Output this shift:    PE: Abd: soft, ND, tenderness around the drain, +BS, Drain with bilious drainage  Lab Results:   Recent Labs  02/05/14 0300 02/06/14 0500  WBC 13.1* 11.4*  HGB 8.8* 9.3*  HCT 27.8* 28.9*  PLT 334 324   BMET  Recent Labs  02/05/14 0300 02/06/14 0500  NA 139 136*  K 3.4* 3.5*  CL 98 98  CO2 25 24  GLUCOSE 89 110*  BUN 16 23  CREATININE 4.01* 5.09*  CALCIUM 8.0* 8.0*   PT/INR  Recent Labs  02/04/14 0350 02/04/14 0930  LABPROT 21.5* 21.4*  INR 1.87* 1.86*   CMP     Component Value Date/Time   NA 136* 02/06/2014 0500   K 3.5* 02/06/2014 0500   CL 98 02/06/2014 0500   CO2 24 02/06/2014 0500   GLUCOSE 110* 02/06/2014 0500   BUN 23 02/06/2014 0500   CREATININE 5.09* 02/06/2014 0500   CALCIUM 8.0* 02/06/2014 0500   PROT 5.8* 02/05/2014 0300   ALBUMIN 1.2* 02/05/2014 0300   AST 38* 02/05/2014 0300   ALT <5 02/05/2014 0300   ALKPHOS 86 02/05/2014 0300   BILITOT 0.5 02/05/2014 0300   GFRNONAA 10* 02/06/2014 0500   GFRAA 12* 02/06/2014 0500   Lipase     Component Value Date/Time   LIPASE 101* 12/09/2011 0218       Studies/Results: Ir Perc Cholecystostomy  02/04/2014   CLINICAL DATA:  Acute cholecystitis, sepsis, percutaneous drainage requested  by surgery.  EXAM: PERCUTANEOUS CHOLECYSTOSTOMY TUBE PLACEMENT WITH ULTRASOUND AND FLUOROSCOPIC GUIDANCE:  FLUOROSCOPY TIME:  1 min 12  TECHNIQUE: The procedure, risks (including but not limited to bleeding, infection, organ damage ), benefits, and alternatives were explained to the family. Questions regarding the procedure were encouraged and answered. The family understands and consents to the procedure. Survey ultrasound of the abdomen was performed and an appropriate skin entry site was identified. Skin site was marked, prepped with Betadine, and draped in usual sterile fashion, and infiltrated locally with 1% lidocaine.  Intravenous Fentanyl and Versed were administered as conscious sedation during continuous cardiorespiratory monitoring by the radiology RN, with a total moderate sedation time of 7 minutes.  Under real-time ultrasound guidance, gallbladder was accessed using a transhepatic approach with a 21-gauge needle. Ultrasound image documentation was saved. Bile could be aspirated through the hub. Needle was exchanged over a 018 guidewire for transitional dilator which allowed placement of 035 J wire. Over this, a 10.2 French pigtail catheter was advanced and formed centrally in the gallbladder lumen. 20 mL of dark bile were aspirated, sent for culture. Small contrast injection confirmed appropriate position. Catheter secured externally with 0 Prolene suture and placed to external drain bag. Patient tolerated the procedure well,  with no immediate complication.  IMPRESSION: 1. Technically successful percutaneous cholecystostomy tube placement with ultrasound and fluoroscopic guidance.   Electronically Signed   By: Arne Cleveland M.D.   On: 02/04/2014 14:53    Anti-infectives: Anti-infectives   Start     Dose/Rate Route Frequency Ordered Stop   02/04/14 1200  vancomycin (VANCOCIN) IVPB 1000 mg/200 mL premix  Status:  Discontinued     1,000 mg 200 mL/hr over 60 Minutes Intravenous Every M-W-F  (Hemodialysis) 02/01/14 0819 02/04/14 0931   02/04/14 1200  vancomycin (VANCOCIN) IVPB 750 mg/150 ml premix  Status:  Discontinued     750 mg 150 mL/hr over 60 Minutes Intravenous Every M-W-F (Hemodialysis) 02/04/14 0931 02/05/14 1055   02/03/14 1200  vancomycin (VANCOCIN) IVPB 1000 mg/200 mL premix  Status:  Discontinued     1,000 mg 200 mL/hr over 60 Minutes Intravenous  Once 02/02/14 1348 02/03/14 1610   01/31/14 1200  vancomycin (VANCOCIN) IVPB 1000 mg/200 mL premix     1,000 mg 200 mL/hr over 60 Minutes Intravenous  Once 01/29/14 1329 01/31/14 1111   01/28/14 1200  vancomycin (VANCOCIN) IVPB 1000 mg/200 mL premix  Status:  Discontinued     1,000 mg 200 mL/hr over 60 Minutes Intravenous Every M-W-F (Hemodialysis) 01/27/14 2005 02/01/14 0819   01/28/14 0430  piperacillin-tazobactam (ZOSYN) IVPB 2.25 g     2.25 g 100 mL/hr over 30 Minutes Intravenous 3 times per day 01/27/14 2005     01/27/14 2015  vancomycin (VANCOCIN) 2,000 mg in sodium chloride 0.9 % 500 mL IVPB     2,000 mg 250 mL/hr over 120 Minutes Intravenous  Once 01/27/14 2005 01/27/14 2336   01/27/14 2015  piperacillin-tazobactam (ZOSYN) IVPB 3.375 g     3.375 g 100 mL/hr over 30 Minutes Intravenous  Once 01/27/14 2005 01/28/14 0430       Assessment/Plan  1. Acute cholecystitis s/p perc drain  Plan: 1. Patient doing well. WBC trending down.  Will advance to low fat diet today.  Follow drain.  Will need interval cholecystectomy in 6-8 weeks.  LOS: 10 days    Cynitha Berte E 02/06/2014, 10:32 AM Pager: [phone]

## 2014-02-06 NOTE — Progress Notes (Signed)
4 Days Post-Op  Subjective: Perc Chole drain placed 8/26 Up in bed Confused but pleasant  Objective: Vital signs in last 24 hours: Temp:  [97.5 F (36.4 C)-98.5 F (36.9 C)] 97.5 F (36.4 C) (08/28 1057) Pulse Rate:  [69-93] 69 (08/28 1057) Resp:  [14-20] 18 (08/28 1057) BP: (87-122)/(26-88) 122/81 mmHg (08/28 1057) SpO2:  [95 %-99 %] 95 % (08/28 1057) Weight:  [82.9 kg (182 lb 12.2 oz)-84.7 kg (186 lb 11.7 oz)] 82.9 kg (182 lb 12.2 oz) (08/28 1057) Last BM Date: 02/05/14  Intake/Output from previous day: 08/27 0701 - 08/28 0700 In: 860 [P.O.:620; I.V.:90; IV Piggyback:150] Out: 300 [Drains:300] Intake/Output this shift: Total I/O In: -  Out: 950 [Drains:100; Other:850]  PE:  Afeb; vss Pleasantly confused 300 cc output yesterday 100 cc today 30 cc in bag- bile Cx no growth Site clean and dry; NT    Lab Results:   Recent Labs  02/05/14 0300 02/06/14 0500  WBC 13.1* 11.4*  HGB 8.8* 9.3*  HCT 27.8* 28.9*  PLT 334 324   BMET  Recent Labs  02/05/14 0300 02/06/14 0500  NA 139 136*  K 3.4* 3.5*  CL 98 98  CO2 25 24  GLUCOSE 89 110*  BUN 16 23  CREATININE 4.01* 5.09*  CALCIUM 8.0* 8.0*   PT/INR  Recent Labs  02/04/14 0350 02/04/14 0930  LABPROT 21.5* 21.4*  INR 1.87* 1.86*   ABG No results found for this basename: PHART, PCO2, PO2, HCO3,  in the last 72 hours  Studies/Results: Ir Perc Cholecystostomy  02/04/2014   CLINICAL DATA:  Acute cholecystitis, sepsis, percutaneous drainage requested by surgery.  EXAM: PERCUTANEOUS CHOLECYSTOSTOMY TUBE PLACEMENT WITH ULTRASOUND AND FLUOROSCOPIC GUIDANCE:  FLUOROSCOPY TIME:  1 min 12  TECHNIQUE: The procedure, risks (including but not limited to bleeding, infection, organ damage ), benefits, and alternatives were explained to the family. Questions regarding the procedure were encouraged and answered. The family understands and consents to the procedure. Survey ultrasound of the abdomen was performed and an  appropriate skin entry site was identified. Skin site was marked, prepped with Betadine, and draped in usual sterile fashion, and infiltrated locally with 1% lidocaine.  Intravenous Fentanyl and Versed were administered as conscious sedation during continuous cardiorespiratory monitoring by the radiology RN, with a total moderate sedation time of 7 minutes.  Under real-time ultrasound guidance, gallbladder was accessed using a transhepatic approach with a 21-gauge needle. Ultrasound image documentation was saved. Bile could be aspirated through the hub. Needle was exchanged over a 018 guidewire for transitional dilator which allowed placement of 035 J wire. Over this, a 10.2 French pigtail catheter was advanced and formed centrally in the gallbladder lumen. 20 mL of dark bile were aspirated, sent for culture. Small contrast injection confirmed appropriate position. Catheter secured externally with 0 Prolene suture and placed to external drain bag. Patient tolerated the procedure well, with no immediate complication.  IMPRESSION: 1. Technically successful percutaneous cholecystostomy tube placement with ultrasound and fluoroscopic guidance.   Electronically Signed   By: Arne Cleveland M.D.   On: 02/04/2014 14:53    Anti-infectives: Anti-infectives   Start     Dose/Rate Route Frequency Ordered Stop   02/04/14 1200  vancomycin (VANCOCIN) IVPB 1000 mg/200 mL premix  Status:  Discontinued     1,000 mg 200 mL/hr over 60 Minutes Intravenous Every M-W-F (Hemodialysis) 02/01/14 0819 02/04/14 0931   02/04/14 1200  vancomycin (VANCOCIN) IVPB 750 mg/150 ml premix  Status:  Discontinued  750 mg 150 mL/hr over 60 Minutes Intravenous Every M-W-F (Hemodialysis) 02/04/14 0931 02/05/14 1055   02/03/14 1200  vancomycin (VANCOCIN) IVPB 1000 mg/200 mL premix  Status:  Discontinued     1,000 mg 200 mL/hr over 60 Minutes Intravenous  Once 02/02/14 1348 02/03/14 1610   01/31/14 1200  vancomycin (VANCOCIN) IVPB 1000  mg/200 mL premix     1,000 mg 200 mL/hr over 60 Minutes Intravenous  Once 01/29/14 1329 01/31/14 1111   01/28/14 1200  vancomycin (VANCOCIN) IVPB 1000 mg/200 mL premix  Status:  Discontinued     1,000 mg 200 mL/hr over 60 Minutes Intravenous Every M-W-F (Hemodialysis) 01/27/14 2005 02/01/14 0819   01/28/14 0430  piperacillin-tazobactam (ZOSYN) IVPB 2.25 g     2.25 g 100 mL/hr over 30 Minutes Intravenous 3 times per day 01/27/14 2005     01/27/14 2015  vancomycin (VANCOCIN) 2,000 mg in sodium chloride 0.9 % 500 mL IVPB     2,000 mg 250 mL/hr over 120 Minutes Intravenous  Once 01/27/14 2005 01/27/14 2336   01/27/14 2015  piperacillin-tazobactam (ZOSYN) IVPB 3.375 g     3.375 g 100 mL/hr over 30 Minutes Intravenous  Once 01/27/14 2005 01/28/14 0430      Assessment/Plan: s/p  Percutaneous chole drain placed 8/26 Doing well Wbc decreased Chole drain needs to be in place x 4-6 weeks Plan per [facility]   LOS: 10 days    Satina Jerrell A 02/06/2014

## 2014-02-06 NOTE — Progress Notes (Signed)
Subjective:   Feeling better, had a good night. No complaints.   Objective Filed Vitals:   02/06/14 1000 02/06/14 1030 02/06/14 1051 02/06/14 1057  BP: 109/88 101/82 90/33 122/81  Pulse: 87 87 86 69  Temp:    97.5 F (36.4 C)  TempSrc:    Oral  Resp:    18  Height:      Weight:    82.9 kg (182 lb 12.2 oz)  SpO2:    95%   Physical Exam General: alert, no acute distress.  Heart: RRR no murmur Lungs: refused exam, unlabored.  Abdomen: refused exam Extremities: L AKA Dialysis Access:  L AVG patent on HD  HD: MWF [place]  4h 82kg 4K/2.25 [place] Heparin 6000 LUA AVG 450/A1.5  ARanesp 100 ug q Wed, Venofer 100 mg q week  Assessment/Plan: 1 Sepsis / acute cholecystitis - on zosyn. Afebrile. WBC 11.4. 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 Tolerating well.  4 Volume/BP 122/81 but hypotensive at times. BP meds on hold. still 1kg over edw, needs new edw s/p amputation 5 Anemia hgb 9.3. cont aranesp and Fe weekly 6 HPTH Ca 8, P 3.5, no vit D or binders  7 DM per primary  8 Hx old CVA / underlying mild dementia  9 CAD - EF 25% by Myoview last admit July, no ischemia , cath recommended but pt declined   Shelle Iron, NP [place] [phone] 02/06/2014,12:56 PM  LOS: 10 days   Pt seen, examined and agree w A/P as above.  [name] MD pager [phone]    cell [phone] 02/06/2014, 4:10 PM     Additional Objective Labs: Basic Metabolic Panel:  Recent Labs Lab 02/03/14 0711 02/04/14 0350 02/05/14 0300 02/06/14 0500  NA 137 137 139 136*  K 4.6 4.0 3.4* 3.5*  CL 97 98 98 98  CO2 25 23 25 24   GLUCOSE 121* 111* 89 110*  BUN 22 27* 16 23  CREATININE 5.31* 6.02* 4.01* 5.09*  CALCIUM 8.3* 8.4 8.0* 8.0*  PHOS 3.5 4.0  --   --    Liver Function Tests:  Recent Labs Lab 02/03/14 0711 02/04/14 0350 02/05/14 0300  AST  --  36 38*   ALT  --  <5 <5  ALKPHOS  --  88 86  BILITOT  --  0.7 0.5  PROT  --  6.3 5.8*  ALBUMIN 1.4* 1.3* 1.2*   No results found for this basename: LIPASE, AMYLASE,  in the last 168 hours CBC:  Recent Labs Lab 02/03/14 0711 02/03/14 1043  02/04/14 0350 02/05/14 0300 02/06/14 0500  WBC 20.9* 21.1*  --  23.2* 13.1* 11.4*  HGB 7.4* 6.0*  < > 8.9* 8.8* 9.3*  HCT 24.4* 19.3*  < > 27.7* 27.8* 28.9*  MCV 87.1 85.8  --  84.2 85.0 83.8  PLT 402* 350  --  384 334 324  < > = values in this interval not displayed. Blood Culture    Component Value Date/Time   SDES FLUID GALL BLADDER 02/04/2014 1442   SPECREQUEST NONE 02/04/2014 1442   CULT  Value: NO GROWTH 2 DAYS Performed at [facility] 02/04/2014 1442   REPTSTATUS PENDING 02/04/2014 1442    Cardiac Enzymes:  Recent Labs Lab 02/03/14 1030 02/03/14 1540 02/03/14 2257  CKTOTAL 157  --   --   CKMB 3.2  --   --  TROPONINI 1.48* 2.58* 4.39*   CBG:  Recent Labs Lab 02/05/14 1556 02/05/14 2106 02/06/14 0016 02/06/14 0439 02/06/14 1218  GLUCAP 125* 174* 137* 113* 82   Iron Studies: No results found for this basename: IRON, TIBC, TRANSFERRIN, FERRITIN,  in the last 72 hours @lablastinr3 @ Studies/Results: Ir Perc Cholecystostomy  02/04/2014   CLINICAL DATA:  Acute cholecystitis, sepsis, percutaneous drainage requested by surgery.  EXAM: PERCUTANEOUS CHOLECYSTOSTOMY TUBE PLACEMENT WITH ULTRASOUND AND FLUOROSCOPIC GUIDANCE:  FLUOROSCOPY TIME:  1 min 12  TECHNIQUE: The procedure, risks (including but not limited to bleeding, infection, organ damage ), benefits, and alternatives were explained to the family. Questions regarding the procedure were encouraged and answered. The family understands and consents to the procedure. Survey ultrasound of the abdomen was performed and an appropriate skin entry site was identified. Skin site was marked, prepped with Betadine, and draped in usual sterile fashion, and infiltrated locally with 1%  lidocaine.  Intravenous Fentanyl and Versed were administered as conscious sedation during continuous cardiorespiratory monitoring by the radiology RN, with a total moderate sedation time of 7 minutes.  Under real-time ultrasound guidance, gallbladder was accessed using a transhepatic approach with a 21-gauge needle. Ultrasound image documentation was saved. Bile could be aspirated through the hub. Needle was exchanged over a 018 guidewire for transitional dilator which allowed placement of 035 J wire. Over this, a 10.2 French pigtail catheter was advanced and formed centrally in the gallbladder lumen. 20 mL of dark bile were aspirated, sent for culture. Small contrast injection confirmed appropriate position. Catheter secured externally with 0 Prolene suture and placed to external drain bag. Patient tolerated the procedure well, with no immediate complication.  IMPRESSION: 1. Technically successful percutaneous cholecystostomy tube placement with ultrasound and fluoroscopic guidance.   Electronically Signed   By: Arne Cleveland M.D.   On: 02/04/2014 14:53   Medications: . sodium chloride Stopped (02/05/14 1600)   . antiseptic oral rinse  7 mL Mouth Rinse q12n4p  . atorvastatin  20 mg Oral q1800  . chlorhexidine  15 mL Mouth Rinse BID  . darbepoetin (ARANESP) injection - DIALYSIS  150 mcg Intravenous Q Wed-HD  . ferric gluconate (FERRLECIT/NULECIT) IV  62.5 mg Intravenous Q Wed-HD  . heparin  5,000 Units Subcutaneous 3 times per day  . piperacillin-tazobactam (ZOSYN)  IV  2.25 g Intravenous 3 times per day  . pneumococcal 23 valent vaccine  0.5 mL Intramuscular Tomorrow-1000  . sodium chloride  10 mL Intravenous Q12H  . sodium chloride  10 mL Intracatheter Q8H

## 2014-02-07 LAB — CULTURE, BLOOD (ROUTINE X 2)
CULTURE: NO GROWTH
Culture: NO GROWTH

## 2014-02-07 LAB — BASIC METABOLIC PANEL
Anion gap: 14 (ref 5–15)
BUN: 11 mg/dL (ref 6–23)
CO2: 25 mEq/L (ref 19–32)
Calcium: 8 mg/dL — ABNORMAL LOW (ref 8.4–10.5)
Chloride: 101 mEq/L (ref 96–112)
Creatinine, Ser: 3.07 mg/dL — ABNORMAL HIGH (ref 0.50–1.35)
GFR, EST AFRICAN AMERICAN: 21 mL/min — AB (ref >90)
GFR, EST NON AFRICAN AMERICAN: 18 mL/min — AB (ref >90)
Glucose, Bld: 85 mg/dL (ref 70–99)
Potassium: 3.9 mEq/L (ref 3.7–5.3)
SODIUM: 140 meq/L (ref 137–147)

## 2014-02-07 LAB — BODY FLUID CULTURE
Culture: NO GROWTH
GRAM STAIN: NONE SEEN

## 2014-02-07 LAB — CBC
HCT: 29.7 % — ABNORMAL LOW (ref 39.0–52.0)
Hemoglobin: 9.3 g/dL — ABNORMAL LOW (ref 13.0–17.0)
MCH: 26.6 pg (ref 26.0–34.0)
MCHC: 31.3 g/dL (ref 30.0–36.0)
MCV: 85.1 fL (ref 78.0–100.0)
PLATELETS: 345 10*3/uL (ref 150–400)
RBC: 3.49 MIL/uL — AB (ref 4.22–5.81)
RDW: 18.8 % — ABNORMAL HIGH (ref 11.5–15.5)
WBC: 9.2 10*3/uL (ref 4.0–10.5)

## 2014-02-07 LAB — GLUCOSE, CAPILLARY
Glucose-Capillary: 89 mg/dL (ref 70–99)
Glucose-Capillary: 80 mg/dL (ref 70–99)
Glucose-Capillary: 106 mg/dL — ABNORMAL HIGH (ref 70–99)

## 2014-02-07 MED ORDER — METRONIDAZOLE 500 MG PO TABS
500.0000 mg | ORAL_TABLET | Freq: Three times a day (TID) | ORAL | Status: DC
  Administered 2014-02-07 – 2014-02-09 (×8): 500 mg via ORAL
  Filled 2014-02-07 (×13): qty 1

## 2014-02-07 MED ORDER — CIPROFLOXACIN HCL 500 MG PO TABS
500.0000 mg | ORAL_TABLET | Freq: Every day | ORAL | Status: DC
  Administered 2014-02-07 – 2014-02-09 (×3): 500 mg via ORAL
  Filled 2014-02-07 (×6): qty 1

## 2014-02-07 MED ORDER — PNEUMOCOCCAL VAC POLYVALENT 25 MCG/0.5ML IJ INJ
0.5000 mL | INJECTION | INTRAMUSCULAR | Status: AC
  Administered 2014-02-08: 0.5 mL via INTRAMUSCULAR
  Filled 2014-02-07: qty 0.5

## 2014-02-07 NOTE — Progress Notes (Signed)
5 Days Post-Op  Subjective: Denies abdominal pain2  Objective: Vital signs in last 24 hours: Temp:  [97.5 F (36.4 C)-98.6 F (37 C)] 98 F (36.7 C) (08/29 0449) Pulse Rate:  [69-91] 90 (08/29 0449) Resp:  [18] 18 (08/29 0449) BP: (87-122)/(26-88) 109/59 mmHg (08/29 0449) SpO2:  [95 %-100 %] 100 % (08/29 0449) Weight:  [182 lb 12.2 oz (82.9 kg)-183 lb 6.8 oz (83.2 kg)] 183 lb 6.8 oz (83.2 kg) (08/29 0449) Last BM Date: 02/05/14  Intake/Output from previous day: 08/28 0701 - 08/29 0700 In: -  Out: 1050 [Drains:200] Intake/Output this shift:    General appearance: cooperative abdomen: soft, NT, chole tube functional  Lab Results:   Recent Labs  02/06/14 0500 02/07/14 0358  WBC 11.4* 9.2  HGB 9.3* 9.3*  HCT 28.9* 29.7*  PLT 324 345   BMET  Recent Labs  02/06/14 0500 02/07/14 0358  NA 136* 140  K 3.5* 3.9  CL 98 101  CO2 24 25  GLUCOSE 110* 85  BUN 23 11  CREATININE 5.09* 3.07*  CALCIUM 8.0* 8.0*   PT/INR  Recent Labs  02/04/14 0930  LABPROT 21.4*  INR 1.86*   ABG No results found for this basename: PHART, PCO2, PO2, HCO3,  in the last 72 hours  Studies/Results: No results found.  Anti-infectives: Anti-infectives   Start     Dose/Rate Route Frequency Ordered Stop   02/07/14 0930  ciprofloxacin (CIPRO) tablet 500 mg     500 mg Oral Daily with breakfast 02/07/14 0823     02/07/14 0930  metroNIDAZOLE (FLAGYL) tablet 500 mg     500 mg Oral 3 times per day 02/07/14 0823     02/04/14 1200  vancomycin (VANCOCIN) IVPB 1000 mg/200 mL premix  Status:  Discontinued     1,000 mg 200 mL/hr over 60 Minutes Intravenous Every M-W-F (Hemodialysis) 02/01/14 0819 02/04/14 0931   02/04/14 1200  vancomycin (VANCOCIN) IVPB 750 mg/150 ml premix  Status:  Discontinued     750 mg 150 mL/hr over 60 Minutes Intravenous Every M-W-F (Hemodialysis) 02/04/14 0931 02/05/14 1055   02/03/14 1200  vancomycin (VANCOCIN) IVPB 1000 mg/200 mL premix  Status:  Discontinued     1,000 mg 200 mL/hr over 60 Minutes Intravenous  Once 02/02/14 1348 02/03/14 1610   01/31/14 1200  vancomycin (VANCOCIN) IVPB 1000 mg/200 mL premix     1,000 mg 200 mL/hr over 60 Minutes Intravenous  Once 01/29/14 1329 01/31/14 1111   01/28/14 1200  vancomycin (VANCOCIN) IVPB 1000 mg/200 mL premix  Status:  Discontinued     1,000 mg 200 mL/hr over 60 Minutes Intravenous Every M-W-F (Hemodialysis) 01/27/14 2005 02/01/14 0819   01/28/14 0430  piperacillin-tazobactam (ZOSYN) IVPB 2.25 g  Status:  Discontinued     2.25 g 100 mL/hr over 30 Minutes Intravenous 3 times per day 01/27/14 2005 02/07/14 0823   01/27/14 2015  vancomycin (VANCOCIN) 2,000 mg in sodium chloride 0.9 % 500 mL IVPB     2,000 mg 250 mL/hr over 120 Minutes Intravenous  Once 01/27/14 2005 01/27/14 2336   01/27/14 2015  piperacillin-tazobactam (ZOSYN) IVPB 3.375 g     3.375 g 100 mL/hr over 30 Minutes Intravenous  Once 01/27/14 2005 01/28/14 0430      Assessment/Plan: s/p Procedure(s): AMPUTATION ABOVE KNEE-LEFT (Left) Cholecystitis - S/P perc chole tube, tol diet, WBC WNL, Cipro/Flagyl L arm edema - primary team is working up  LOS: 11 days    Jebidiah Baggerly E 02/07/2014

## 2014-02-07 NOTE — Progress Notes (Signed)
Follow up call done to vascular dept . Dept . Closed at this time . Made DR Broadus John aware . Could be done in am . Kept L arm elevated on pillow . Heat pad in place . Pt informed.  Pulses palpable with good  Sensation.. Skin warm and dry to touch. Denied pain.

## 2014-02-07 NOTE — Clinical Social Work Note (Signed)
CSW continues to follow patient for d/c planning needs. CSW met with patients daughter (Sean [name]) and [name]. CSW provided both with available bed offers. Per Sean, patient expressed desire to return to [facility]. Patient's daughters requested CSW send out updated clinicals indicating patient is new AKA to SNF. CSW informed daughters decision regarding facility needs to be made prior to Monday as patient's expected d/c is Monday. Both daughters verbalized understanding and stated they will discuss with patient and brother as patient will either go to [facility] or [facility] if no other bed offers are made. CSW faxed updated information to [facility]. SNF. CSW to follow tomorrow.  [place], [place] Weekend Clinical Social Worker [phone]

## 2014-02-07 NOTE — Progress Notes (Signed)
[facility] PROGRESS NOTE  VARDAAN [name] HDQ:[id number] DOB: Jun 02, 1936 DOA: 01/27/2014 PCP: Philis Fendt, MD  PCCM transfer 8/28 Septic shock required pressors, ESRD, new AKA and per chole drain for Acute cholecystitis  Assessment/Plan: 1. Septic shock -improved off pressors -due to acute cholecystitis -s/p perc chole drain 8/27 -CCS following, diet advanced to regular, afebrile, WBC normal now -blood Cx negative -Stop IV Zosyn day 10, will transition to PO CIpro/Flagyl  2. 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 Elevated Troponin -seen by Cards, felt to be demand ischemia -start ASA, continue statin, add low dose BB when BP tolerates  4. ESRD on HD -concern per family abt swelling at AVF -HD per Renal -US pending  5. Anemia of chronic disease -cont aranesp  6. Secondary hyperparathyroidism  7. DM -CBGs 90-110s, check hbaic  8.Hx old CVA / underlying mild dementia   9. CAD - EF 25% by Myoview last admit July, no ischemia , cath was recommended but pt declined  DVT proph: Hep SQ  Code Status: Full Code Family Communication: daughter at bedside Disposition Plan:  SNF Monday   Consultants:  VVS  PCCM  CCS  Antibiotics:  zOSYN  HPI/Subjective: Feels ok, no complaints, daughter complains abt swelling around LUE AVF  Objective: Filed Vitals:   02/07/14 0449  BP: 109/59  Pulse: 90  Temp: 98 F (36.7 C)  Resp: 18    Intake/Output Summary (Last 24 hours) at 02/07/14 1136 Last data filed at 02/07/14 0124  Gross per 24 hour  Intake      0 ml  Output    200 ml  Net   -200 ml   Filed Weights   02/06/14 0714 02/06/14 1057 02/07/14 0449  Weight: 83.8 kg (184 lb 11.9 oz) 82.9 kg (182 lb 12.2 oz) 83.2 kg (183 lb 6.8 oz)    Exam:   General:  AAOx3, chronically ill appearing  Cardiovascular: S1S2/RRR  Respiratory: CTAB  Abdomen: soft, NT, Drain in  RUQ, BS present  Musculoskeletal: L AKA , LUE with AVF, slight swelling of forehand  Data Reviewed: Basic Metabolic Panel:  Recent Labs Lab 02/03/14 0711 02/04/14 0350 02/05/14 0300 02/06/14 0500 02/07/14 0358  NA 137 137 139 136* 140  K 4.6 4.0 3.4* 3.5* 3.9  CL 97 98 98 98 101  CO2 25 23 25 24 25   GLUCOSE 121* 111* 89 110* 85  BUN 22 27* 16 23 11   CREATININE 5.31* 6.02* 4.01* 5.09* 3.07*  CALCIUM 8.3* 8.4 8.0* 8.0* 8.0*  MG  --  1.9  --   --   --   PHOS 3.5 4.0  --   --   --    Liver Function Tests:  Recent Labs Lab 02/03/14 0711 02/04/14 0350 02/05/14 0300  AST  --  36 38*  ALT  --  <5 <5  ALKPHOS  --  88 86  BILITOT  --  0.7 0.5  PROT  --  6.3 5.8*  ALBUMIN 1.4* 1.3* 1.2*   No results found for this basename: LIPASE, AMYLASE,  in the last 168 hours No results found for this basename: AMMONIA,  in the last 168 hours CBC:  Recent Labs Lab 02/03/14 1043 02/03/14 2257 02/04/14 0350 02/05/14 0300 02/06/14 0500 02/07/14 0358  WBC 21.1*  --  23.2* 13.1* 11.4* 9.2  HGB 6.0* 8.8* 8.9* 8.8* 9.3* 9.3*  HCT 19.3* 27.2* 27.7* 27.8* 28.9*  29.7*  MCV 85.8  --  84.2 85.0 83.8 85.1  PLT 350  --  384 334 324 345   Cardiac Enzymes:  Recent Labs Lab 02/03/14 1030 02/03/14 1540 02/03/14 2257  CKTOTAL 157  --   --   CKMB 3.2  --   --   TROPONINI 1.48* 2.58* 4.39*   BNP (last 3 results)  Recent Labs  12/13/13 1408 01/17/14 0800  PROBNP >70000.0* >70000.0*   CBG:  Recent Labs Lab 02/06/14 0016 02/06/14 0439 02/06/14 1218 02/06/14 2117 02/07/14 1118  GLUCAP 137* 113* 82 97 89    Recent Results (from the past 240 hour(s))  CULTURE, BLOOD (ROUTINE X 2)     Status: None   Collection Time    01/31/14  4:28 PM      Result Value Ref Range Status   Specimen Description BLOOD RIGHT HAND   Final   Special Requests BOTTLES DRAWN AEROBIC AND ANAEROBIC 10 CC   Final   Culture  Setup Time     Final   Value: 02/01/2014 00:09     Performed at [facility]   Culture     Final   Value:        BLOOD CULTURE RECEIVED NO GROWTH TO DATE CULTURE WILL BE HELD FOR 5 DAYS BEFORE ISSUING A FINAL NEGATIVE REPORT     Performed at [facility]   Report Status PENDING   Incomplete  CULTURE, BLOOD (ROUTINE X 2)     Status: None   Collection Time    02/01/14  7:22 AM      Result Value Ref Range Status   Specimen Description BLOOD RIGHT HAND   Final   Special Requests BOTTLES DRAWN AEROBIC AND ANAEROBIC 5CC EACH   Final   Culture  Setup Time     Final   Value: 02/01/2014 17:45     Performed at [facility]   Culture     Final   Value:        BLOOD CULTURE RECEIVED NO GROWTH TO DATE CULTURE WILL BE HELD FOR 5 DAYS BEFORE ISSUING A FINAL NEGATIVE REPORT     Performed at [facility]   Report Status PENDING   Incomplete  SURGICAL PCR SCREEN     Status: Abnormal   Collection Time    02/02/14  5:04 AM      Result Value Ref Range Status   MRSA, PCR NEGATIVE  NEGATIVE Final   Staphylococcus aureus POSITIVE (*) NEGATIVE Final   Comment:            The Xpert SA Assay (FDA     approved for NASAL specimens     in patients over 77 years of age),     is one component of     a comprehensive surveillance     program.  Test performance has     been validated by [employer] for patients greater     than or equal to 48 year old.     It is not intended     to diagnose infection nor to     guide or monitor treatment.  MRSA PCR SCREENING     Status: None   Collection Time    02/03/14 10:47 AM      Result Value Ref Range Status   MRSA by PCR NEGATIVE  NEGATIVE Final   Comment:            The  GeneXpert MRSA Assay (FDA     approved for NASAL specimens     only), is one component of a     comprehensive MRSA colonization     surveillance program. It is not     intended to diagnose MRSA     infection nor to guide or     monitor treatment for     MRSA infections.  BODY FLUID CULTURE     Status: None   Collection Time     02/04/14  2:42 PM      Result Value Ref Range Status   Specimen Description FLUID GALL BLADDER   Final   Special Requests NONE   Final   Gram Stain     Final   Value: NO WBC SEEN     NO ORGANISMS SEEN     Performed at [facility]   Culture     Final   Value: NO GROWTH 2 DAYS     Performed at [facility]   Report Status PENDING   Incomplete     Studies: No results found.  Scheduled Meds: . antiseptic oral rinse  7 mL Mouth Rinse q12n4p  . aspirin  81 mg Oral Daily  . atorvastatin  20 mg Oral q1800  . chlorhexidine  15 mL Mouth Rinse BID  . ciprofloxacin  500 mg Oral Q breakfast  . darbepoetin (ARANESP) injection - DIALYSIS  150 mcg Intravenous Q Wed-HD  . ferric gluconate (FERRLECIT/NULECIT) IV  62.5 mg Intravenous Q Wed-HD  . heparin  5,000 Units Subcutaneous 3 times per day  . metroNIDAZOLE  500 mg Oral 3 times per day  . pneumococcal 23 valent vaccine  0.5 mL Intramuscular Tomorrow-1000  . sodium chloride  10 mL Intravenous Q12H  . sodium chloride  10 mL Intracatheter Q8H   Continuous Infusions: . sodium chloride 10 mL/hr at 02/07/14 0124   Antibiotics Given (last 72 hours)   Date/Time Action Medication Dose Rate   02/04/14 1307 Given   piperacillin-tazobactam (ZOSYN) IVPB 2.25 g 2.25 g 100 mL/hr   02/04/14 1941 Given   piperacillin-tazobactam (ZOSYN) IVPB 2.25 g 2.25 g 100 mL/hr   02/05/14 0429 Given   piperacillin-tazobactam (ZOSYN) IVPB 2.25 g 2.25 g 100 mL/hr   02/05/14 1236 Given   piperacillin-tazobactam (ZOSYN) IVPB 2.25 g 2.25 g 100 mL/hr   02/05/14 2208 Given   piperacillin-tazobactam (ZOSYN) IVPB 2.25 g 2.25 g 100 mL/hr   02/06/14 0603 Given   piperacillin-tazobactam (ZOSYN) IVPB 2.25 g 2.25 g 100 mL/hr   02/06/14 1220 Given   piperacillin-tazobactam (ZOSYN) IVPB 2.25 g 2.25 g 100 mL/hr   02/06/14 2010 Given   piperacillin-tazobactam (ZOSYN) IVPB 2.25 g 2.25 g 100 mL/hr   02/07/14 0459 Given   piperacillin-tazobactam (ZOSYN) IVPB 2.25  g 2.25 g 100 mL/hr   02/07/14 1108 Given   ciprofloxacin (CIPRO) tablet 500 mg 500 mg    02/07/14 1109 Given   metroNIDAZOLE (FLAGYL) tablet 500 mg 500 mg       Principal Problem:   Open leg wound Active Problems:   Septic shock   Demand ischemia   Septic shock(785.52)   Cholecystitis   NSTEMI (non-ST elevated myocardial infarction)   Acute blood loss anemia   Malnutrition of moderate degree   Severe sepsis(995.92)    Time spent: 59min    [facility] Pager [phone]. If 7PM-7AM, please contact night-coverage at [email or web address], password [facility] 02/07/2014, 11:36 AM  LOS: 11 days

## 2014-02-07 NOTE — Progress Notes (Signed)
0650 Resting in bed . Safety precaution maintained

## 2014-02-07 NOTE — Progress Notes (Signed)
Subjective:   NO complaints.  Doesn't know any of the details of why he is here or what procedures have been done. Knows the year.    Objective Filed Vitals:   02/06/14 1057 02/06/14 1450 02/06/14 2037 02/07/14 0449  BP: 122/81 87/55 99/46  109/59  Pulse: 69 91 90 90  Temp: 97.5 F (36.4 C) 98.6 F (37 C) 97.6 F (36.4 C) 98 F (36.7 C)  TempSrc: Oral Oral Oral Axillary  Resp: 18  18 18   Height:      Weight: 82.9 kg (182 lb 12.2 oz)   83.2 kg (183 lb 6.8 oz)  SpO2: 95% 96% 100% 100%   Physical Exam General: alert, no acute distress.  Heart: RRR no murmur Lungs: clear bilat Abdomen: nontender, no ascites Extremities: L AKA Dialysis Access:  L AVG patent on HD Neuro: pleasantly confused, gen weakness, nf  HD: MWF [place]  4h 82kg 4K/2.25 [place] Heparin 6000 LUA AVG 450/A1.5  ARanesp 100 ug q Wed, Venofer 100 mg q week  Assessment/Plan: 1 Sepsis / acute cholecystitis - on zosyn. Afebrile. WBC 11.4. 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 cont aranesp and Fe weekly 6 HPTH Ca 8, P 3.5, no vit D or binders  7 DM per primary  8 Hx old CVA / underlying mild dementia  9 CAD - EF 25% by Myoview last admit July, no ischemia , cath recommended but pt declined 10 AMS-  Underlying dementia +acute illness   [name] MD pager [phone]    cell [phone] 02/07/2014, 9:48 AM     Additional Objective Labs: Basic Metabolic Panel:  Recent Labs Lab 02/03/14 0711 02/04/14 0350 02/05/14 0300 02/06/14 0500 02/07/14 0358  NA 137 137 139 136* 140  K 4.6 4.0 3.4* 3.5* 3.9  CL 97 98 98 98 101  CO2 25 23 25 24 25   GLUCOSE 121* 111* 89 110* 85  BUN 22 27* 16 23 11   CREATININE 5.31* 6.02* 4.01* 5.09* 3.07*  CALCIUM 8.3* 8.4 8.0* 8.0* 8.0*  PHOS 3.5 4.0  --   --   --    Liver Function Tests:  Recent  Labs Lab 02/03/14 0711 02/04/14 0350 02/05/14 0300  AST  --  36 38*  ALT  --  <5 <5  ALKPHOS  --  88 86  BILITOT  --  0.7 0.5  PROT  --  6.3 5.8*  ALBUMIN 1.4* 1.3* 1.2*   No results found for this basename: LIPASE, AMYLASE,  in the last 168 hours CBC:  Recent Labs Lab 02/03/14 1043  02/04/14 0350 02/05/14 0300 02/06/14 0500 02/07/14 0358  WBC 21.1*  --  23.2* 13.1* 11.4* 9.2  HGB 6.0*  < > 8.9* 8.8* 9.3* 9.3*  HCT 19.3*  < > 27.7* 27.8* 28.9* 29.7*  MCV 85.8  --  84.2 85.0 83.8 85.1  PLT 350  --  384 334 324 345  < > = values in this interval not displayed. Blood Culture    Component Value Date/Time   SDES FLUID GALL BLADDER 02/04/2014 1442   SPECREQUEST NONE 02/04/2014 1442   CULT  Value: NO GROWTH 2 DAYS Performed at [facility] 02/04/2014 1442   REPTSTATUS PENDING 02/04/2014 1442    Cardiac Enzymes:  Recent Labs Lab 02/03/14 1030 02/03/14  1540 02/03/14 2257  CKTOTAL 157  --   --   CKMB 3.2  --   --   TROPONINI 1.48* 2.58* 4.39*   CBG:  Recent Labs Lab 02/05/14 2106 02/06/14 0016 02/06/14 0439 02/06/14 1218 02/06/14 2117  GLUCAP 174* 137* 113* 82 97   Iron Studies: No results found for this basename: IRON, TIBC, TRANSFERRIN, FERRITIN,  in the last 72 hours @lablastinr3 @ Studies/Results: No results found. Medications: . sodium chloride 10 mL/hr at 02/07/14 0124   . antiseptic oral rinse  7 mL Mouth Rinse q12n4p  . aspirin  81 mg Oral Daily  . atorvastatin  20 mg Oral q1800  . chlorhexidine  15 mL Mouth Rinse BID  . ciprofloxacin  500 mg Oral Q breakfast  . darbepoetin (ARANESP) injection - DIALYSIS  150 mcg Intravenous Q Wed-HD  . ferric gluconate (FERRLECIT/NULECIT) IV  62.5 mg Intravenous Q Wed-HD  . heparin  5,000 Units Subcutaneous 3 times per day  . metroNIDAZOLE  500 mg Oral 3 times per day  . pneumococcal 23 valent vaccine  0.5 mL Intramuscular Tomorrow-1000  . sodium chloride  10 mL Intravenous Q12H  . sodium chloride  10 mL  Intracatheter Q8H

## 2014-02-08 DIAGNOSIS — M7989 Other specified soft tissue disorders: Secondary | ICD-10-CM

## 2014-02-08 DIAGNOSIS — A419 Sepsis, unspecified organism: Secondary | ICD-10-CM | POA: Diagnosis not present

## 2014-02-08 DIAGNOSIS — K819 Cholecystitis, unspecified: Secondary | ICD-10-CM

## 2014-02-08 LAB — GLUCOSE, CAPILLARY
GLUCOSE-CAPILLARY: 78 mg/dL (ref 70–99)
Glucose-Capillary: 109 mg/dL — ABNORMAL HIGH (ref 70–99)
Glucose-Capillary: 89 mg/dL (ref 70–99)
Glucose-Capillary: 105 mg/dL — ABNORMAL HIGH (ref 70–99)

## 2014-02-08 NOTE — Progress Notes (Signed)
Pt's dtr. [place] requesting for pt to have provolene boot to [facility].  Notified Dr. Broadus John.  On coming nurse also made aware.  Karie Kirks, Therapist, sports.

## 2014-02-08 NOTE — Progress Notes (Signed)
[facility] PROGRESS NOTE  Sean [name] PZW:[id number] DOB: 01-25-1936 DOA: 01/27/2014 PCP: Philis Fendt, MD  PCCM transfer 8/28 Septic shock required pressors, ESRD, new AKA and per chole drain for Acute cholecystitis  Assessment/Plan: 1. Septic shock -improved, stable off pressors -due to acute cholecystitis -s/p perc chole drain 8/27, plan for interval cholecystectomy in 6-8weeks -CCS following, diet advanced to regular, afebrile, WBC normal now -blood Cx negative -Stopped IV Zosyn on day 10, transitioned to PO CIpro/Flagyl 8/29 continue for 4 more days  2. 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 Elevated Troponin -seen by Cards, felt to be demand ischemia -start ASA, continue statin, add low dose BB when BP tolerates  4. ESRD on HD -concern per family abt mild swelling of hand around AVF -HD per Renal -US negative  5. Anemia of chronic disease -cont aranesp  6. Secondary hyperparathyroidism  7. DM -CBGs 90-110s, hbaic 5.9  8.Hx old CVA / underlying mild dementia   9. CAD - EF 25% by Myoview last admit July, no ischemia , cath was recommended but pt declined  DVT proph: Hep SQ  Code Status: Full Code Family Communication: daughter at bedside Disposition Plan:  SNF Monday   Consultants:  VVS  PCCM  CCS  Antibiotics:  zOSYN  HPI/Subjective: Feels ok, no complaints, tolerating diet   Objective: Filed Vitals:   02/08/14 0603  BP: 105/59  Pulse: 86  Temp: 97.4 F (36.3 C)  Resp: 18    Intake/Output Summary (Last 24 hours) at 02/08/14 1227 Last data filed at 02/08/14 0051  Gross per 24 hour  Intake      0 ml  Output     50 ml  Net    -50 ml   Filed Weights   02/06/14 1057 02/07/14 0449 02/08/14 0603  Weight: 82.9 kg (182 lb 12.2 oz) 83.2 kg (183 lb 6.8 oz) 83.5 kg (184 lb 1.4 oz)    Exam:   General:  AAOx3, chronically ill  appearing  Cardiovascular: S1S2/RRR  Respiratory: CTAB  Abdomen: soft, NT, Drain in RUQ, BS present  Musculoskeletal: L AKA , LUE with AVF, slight swelling of forehand  Data Reviewed: Basic Metabolic Panel:  Recent Labs Lab 02/03/14 0711 02/04/14 0350 02/05/14 0300 02/06/14 0500 02/07/14 0358  NA 137 137 139 136* 140  K 4.6 4.0 3.4* 3.5* 3.9  CL 97 98 98 98 101  CO2 25 23 25 24 25   GLUCOSE 121* 111* 89 110* 85  BUN 22 27* 16 23 11   CREATININE 5.31* 6.02* 4.01* 5.09* 3.07*  CALCIUM 8.3* 8.4 8.0* 8.0* 8.0*  MG  --  1.9  --   --   --   PHOS 3.5 4.0  --   --   --    Liver Function Tests:  Recent Labs Lab 02/03/14 0711 02/04/14 0350 02/05/14 0300  AST  --  36 38*  ALT  --  <5 <5  ALKPHOS  --  88 86  BILITOT  --  0.7 0.5  PROT  --  6.3 5.8*  ALBUMIN 1.4* 1.3* 1.2*   No results found for this basename: LIPASE, AMYLASE,  in the last 168 hours No results found for this basename: AMMONIA,  in the last 168 hours CBC:  Recent Labs Lab 02/03/14 1043 02/03/14 2257 02/04/14 0350 02/05/14 0300 02/06/14 0500 02/07/14 0358  WBC 21.1*  --  23.2* 13.1* 11.4* 9.2  HGB 6.0* 8.8* 8.9* 8.8* 9.3* 9.3*  HCT 19.3* 27.2* 27.7* 27.8* 28.9* 29.7*  MCV 85.8  --  84.2 85.0 83.8 85.1  PLT 350  --  384 334 324 345   Cardiac Enzymes:  Recent Labs Lab 02/03/14 1030 02/03/14 1540 02/03/14 2257  CKTOTAL 157  --   --   CKMB 3.2  --   --   TROPONINI 1.48* 2.58* 4.39*   BNP (last 3 results)  Recent Labs  12/13/13 1408 01/17/14 0800  PROBNP >70000.0* >70000.0*   CBG:  Recent Labs Lab 02/06/14 2117 02/07/14 1118 02/07/14 1612 02/07/14 2140 02/08/14 0605  GLUCAP 97 89 80 106* 109*    Recent Results (from the past 240 hour(s))  CULTURE, BLOOD (ROUTINE X 2)     Status: None   Collection Time    01/31/14  4:28 PM      Result Value Ref Range Status   Specimen Description BLOOD RIGHT HAND   Final   Special Requests BOTTLES DRAWN AEROBIC AND ANAEROBIC 10 CC   Final    Culture  Setup Time     Final   Value: 02/01/2014 00:09     Performed at [facility]   Culture     Final   Value: NO GROWTH 5 DAYS     Performed at [facility]   Report Status 02/07/2014 FINAL   Final  CULTURE, BLOOD (ROUTINE X 2)     Status: None   Collection Time    02/01/14  7:22 AM      Result Value Ref Range Status   Specimen Description BLOOD RIGHT HAND   Final   Special Requests BOTTLES DRAWN AEROBIC AND ANAEROBIC [facility] EACH   Final   Culture  Setup Time     Final   Value: 02/01/2014 17:45     Performed at [facility]   Culture     Final   Value: NO GROWTH 5 DAYS     Performed at [facility]   Report Status 02/07/2014 FINAL   Final  SURGICAL PCR SCREEN     Status: Abnormal   Collection Time    02/02/14  5:04 AM      Result Value Ref Range Status   MRSA, PCR NEGATIVE  NEGATIVE Final   Staphylococcus aureus POSITIVE (*) NEGATIVE Final   Comment:            The Xpert SA Assay (FDA     approved for NASAL specimens     in patients over 31 years of age),     is one component of     a comprehensive surveillance     program.  Test performance has     been validated by [employer] for patients greater     than or equal to 9 year old.     It is not intended     to diagnose infection nor to     guide or monitor treatment.  MRSA PCR SCREENING     Status: None   Collection Time    02/03/14 10:47 AM      Result Value Ref Range Status   MRSA by PCR NEGATIVE  NEGATIVE Final   Comment:            The GeneXpert MRSA Assay (FDA     approved for NASAL specimens     only), is one component of a     comprehensive MRSA colonization  surveillance program. It is not     intended to diagnose MRSA     infection nor to guide or     monitor treatment for     MRSA infections.  BODY FLUID CULTURE     Status: None   Collection Time    02/04/14  2:42 PM      Result Value Ref Range Status   Specimen Description FLUID GALL BLADDER   Final    Special Requests NONE   Final   Gram Stain     Final   Value: NO WBC SEEN     NO ORGANISMS SEEN     Performed at [facility]   Culture     Final   Value: NO GROWTH 3 DAYS     Performed at [facility]   Report Status 02/07/2014 FINAL   Final     Studies: No results found.  Scheduled Meds: . antiseptic oral rinse  7 mL Mouth Rinse q12n4p  . aspirin  81 mg Oral Daily  . atorvastatin  20 mg Oral q1800  . chlorhexidine  15 mL Mouth Rinse BID  . ciprofloxacin  500 mg Oral Q breakfast  . darbepoetin (ARANESP) injection - DIALYSIS  150 mcg Intravenous Q Wed-HD  . ferric gluconate (FERRLECIT/NULECIT) IV  62.5 mg Intravenous Q Wed-HD  . heparin  5,000 Units Subcutaneous 3 times per day  . metroNIDAZOLE  500 mg Oral 3 times per day  . sodium chloride  10 mL Intravenous Q12H  . sodium chloride  10 mL Intracatheter Q8H   Continuous Infusions: . sodium chloride 10 mL/hr at 02/08/14 0309   Antibiotics Given (last 72 hours)   Date/Time Action Medication Dose Rate   02/05/14 1236 Given   piperacillin-tazobactam (ZOSYN) IVPB 2.25 g 2.25 g 100 mL/hr   02/05/14 2208 Given   piperacillin-tazobactam (ZOSYN) IVPB 2.25 g 2.25 g 100 mL/hr   02/06/14 0603 Given   piperacillin-tazobactam (ZOSYN) IVPB 2.25 g 2.25 g 100 mL/hr   02/06/14 1220 Given   piperacillin-tazobactam (ZOSYN) IVPB 2.25 g 2.25 g 100 mL/hr   02/06/14 2010 Given   piperacillin-tazobactam (ZOSYN) IVPB 2.25 g 2.25 g 100 mL/hr   02/07/14 0459 Given   piperacillin-tazobactam (ZOSYN) IVPB 2.25 g 2.25 g 100 mL/hr   02/07/14 1108 Given   ciprofloxacin (CIPRO) tablet 500 mg 500 mg    02/07/14 1109 Given   metroNIDAZOLE (FLAGYL) tablet 500 mg 500 mg    02/07/14 1500 Given   metroNIDAZOLE (FLAGYL) tablet 500 mg 500 mg    02/07/14 2216 Given   metroNIDAZOLE (FLAGYL) tablet 500 mg 500 mg    02/08/14 0539 Given   metroNIDAZOLE (FLAGYL) tablet 500 mg 500 mg    02/08/14 3338 Given   ciprofloxacin (CIPRO) tablet 500 mg  500 mg       Principal Problem:   Open leg wound Active Problems:   Septic shock   Demand ischemia   Septic shock(785.52)   Cholecystitis   NSTEMI (non-ST elevated myocardial infarction)   Acute blood loss anemia   Malnutrition of moderate degree   Severe sepsis(995.92)    Time spent: 31min    [facility] Pager [phone]. If 7PM-7AM, please contact night-coverage at [email or web address], password [facility] 02/08/2014, 12:27 PM  LOS: 12 days

## 2014-02-08 NOTE — Progress Notes (Addendum)
VASCULAR LAB PRELIMINARY  PRELIMINARY  PRELIMINARY  PRELIMINARY  Bilateral upper extremity venous Dopplers completed.    Preliminary report:  There is no obvious evidence of DVT or SVT noted in the bilateral upper extremities. Left AVG is patent.  Wetona Viramontes, RVT 02/08/2014, 10:55 AM

## 2014-02-08 NOTE — Progress Notes (Signed)
Subjective:  Doing well, no complaints, denies pain, no nausea; daughter is concerned about pressure wounds  Objective: Vital signs in last 24 hours: Temp:  [97.4 F (36.3 C)-97.9 F (36.6 C)] 97.4 F (36.3 C) (08/30 0603) Pulse Rate:  [86-87] 86 (08/30 0603) Resp:  [18] 18 (08/30 0603) BP: (105-109)/(59-62) 105/59 mmHg (08/30 0603) SpO2:  [100 %] 100 % (08/30 0603) Weight:  [83.5 kg (184 lb 1.4 oz)] 83.5 kg (184 lb 1.4 oz) (08/30 0603) Weight change: -0.3 kg (-10.6 oz)  Intake/Output from previous day: 08/29 0701 - 08/30 0700 In: -  Out: 50 [Drains:50] Intake/Output this shift:   Lab Results:  Recent Labs  02/06/14 0500 02/07/14 0358  WBC 11.4* 9.2  HGB 9.3* 9.3*  HCT 28.9* 29.7*  PLT 324 345   BMET:  Recent Labs  02/06/14 0500 02/07/14 0358  NA 136* 140  K 3.5* 3.9  CL 98 101  CO2 24 25  GLUCOSE 110* 85  BUN 23 11  CREATININE 5.09* 3.07*  CALCIUM 8.0* 8.0*   No results found for this basename: PTH,  in the last 72 hours Iron Studies: No results found for this basename: IRON, TIBC, TRANSFERRIN, FERRITIN,  in the last 72 hours  Studies/Results: No results found.  EXAM: General appearance:  Alert, in no apparent distress Resp:  CTA without rales, rhonchi, or wheezes Cardio:  RRR without murmur or rub GI:  + BS, soft and nontender Extremities:  No edema, dressing on L AKA Access:  AVG @ LUA with + bruit  HD: MWF [place]  4h 82kg 4K/2.25 [place] Heparin 6000 LUA AVG 450/A1.5  ARanesp 100 ug q Wed, Venofer 100 mg q week  Assessment/Plan: 1. Sepsis /acute cholecystitis - improved with percutaneous cholecystotomy drain (placed 8/27), WBCs down to 9.2, afebrile, now on PO Cipro & Flagyl, cholecystectomy in 6-8 wks. 2. Left AKA - 8/24 per Dr. Oneida Alar, sec to necrotic calciphyllactic wounds. 3. ESRD - HD in MWF @ [place], K 3.9.  HD tomorrow. 4. HTN/Volume - BP 105/59 off outpatient Carvedilol 3.125 mg bid & Bidil 20-37.5 mg bid; unable to remove fluid sec to low  BPs. 5. Anemia - Hgb 9.3, Aranesp 150 mcg & Fe on Wed.  6. Sec HPT - Ca 8; no Vitamin D or binders.  7. Nutrition - renal diet, multivitamin.  8. DM - per primary. 9. Hx CVA - with underlying dementia. Confused , but possibly close to baseline,  10. Dispo - HD in AM to prepare for discharge to [facility].   LOS: 12 days   LYLES,CHARLES 02/08/2014,11:59 AM  Pt seen, examined, agree w assess/plan as above with additions as indicated.  [name] MD pager [phone]    cell [phone] 02/08/2014, 1:11 PM

## 2014-02-08 NOTE — Progress Notes (Signed)
Patient ID: Sean [name], male   DOB: [birth year], 78 y.o.   MRN: [id number] 6 Days Post-Op  Subjective: Denies pain/nausea/vomiting.  Eating breakfast.    Objective: Vital signs in last 24 hours: Temp:  [97.4 F (36.3 C)-97.9 F (36.6 C)] 97.4 F (36.3 C) (08/30 0603) Pulse Rate:  [84-87] 86 (08/30 0603) Resp:  [18] 18 (08/30 0603) BP: (105-110)/(59-62) 105/59 mmHg (08/30 0603) SpO2:  [100 %] 100 % (08/30 0603) Weight:  [184 lb 1.4 oz (83.5 kg)] 184 lb 1.4 oz (83.5 kg) (08/30 0603) Last BM Date: 02/07/14  Intake/Output from previous day: 08/29 0701 - 08/30 0700 In: -  Out: 50 [Drains:50] Intake/Output this shift:    General appearance: cooperative abdomen: soft, NT, chole tube functional  Lab Results:   Recent Labs  02/06/14 0500 02/07/14 0358  WBC 11.4* 9.2  HGB 9.3* 9.3*  HCT 28.9* 29.7*  PLT 324 345   BMET  Recent Labs  02/06/14 0500 02/07/14 0358  NA 136* 140  K 3.5* 3.9  CL 98 101  CO2 24 25  GLUCOSE 110* 85  BUN 23 11  CREATININE 5.09* 3.07*  CALCIUM 8.0* 8.0*   PT/INR No results found for this basename: LABPROT, INR,  in the last 72 hours ABG No results found for this basename: PHART, PCO2, PO2, HCO3,  in the last 72 hours  Studies/Results: No results found.  Anti-infectives: Anti-infectives   Start     Dose/Rate Route Frequency Ordered Stop   02/07/14 0930  ciprofloxacin (CIPRO) tablet 500 mg     500 mg Oral Daily with breakfast 02/07/14 0823     02/07/14 0930  metroNIDAZOLE (FLAGYL) tablet 500 mg     500 mg Oral 3 times per day 02/07/14 0823     02/04/14 1200  vancomycin (VANCOCIN) IVPB 1000 mg/200 mL premix  Status:  Discontinued     1,000 mg 200 mL/hr over 60 Minutes Intravenous Every M-W-F (Hemodialysis) 02/01/14 0819 02/04/14 0931   02/04/14 1200  vancomycin (VANCOCIN) IVPB 750 mg/150 ml premix  Status:  Discontinued     750 mg 150 mL/hr over 60 Minutes Intravenous Every M-W-F (Hemodialysis) 02/04/14 0931 02/05/14 1055   02/03/14 1200  vancomycin (VANCOCIN) IVPB 1000 mg/200 mL premix  Status:  Discontinued     1,000 mg 200 mL/hr over 60 Minutes Intravenous  Once 02/02/14 1348 02/03/14 1610   01/31/14 1200  vancomycin (VANCOCIN) IVPB 1000 mg/200 mL premix     1,000 mg 200 mL/hr over 60 Minutes Intravenous  Once 01/29/14 1329 01/31/14 1111   01/28/14 1200  vancomycin (VANCOCIN) IVPB 1000 mg/200 mL premix  Status:  Discontinued     1,000 mg 200 mL/hr over 60 Minutes Intravenous Every M-W-F (Hemodialysis) 01/27/14 2005 02/01/14 0819   01/28/14 0430  piperacillin-tazobactam (ZOSYN) IVPB 2.25 g  Status:  Discontinued     2.25 g 100 mL/hr over 30 Minutes Intravenous 3 times per day 01/27/14 2005 02/07/14 0823   01/27/14 2015  vancomycin (VANCOCIN) 2,000 mg in sodium chloride 0.9 % 500 mL IVPB     2,000 mg 250 mL/hr over 120 Minutes Intravenous  Once 01/27/14 2005 01/27/14 2336   01/27/14 2015  piperacillin-tazobactam (ZOSYN) IVPB 3.375 g     3.375 g 100 mL/hr over 30 Minutes Intravenous  Once 01/27/14 2005 01/28/14 0430      Assessment/Plan: s/p Procedure(s): AMPUTATION ABOVE KNEE-LEFT (Left) Cholecystitis - S/P perc chole tube, tol diet, WBC WNL, Cipro/Flagyl.  Follow up as outpatient.  L arm  edema - primary team is working up   LOS: 12 days    Tifany Hirsch 02/08/2014

## 2014-02-09 ENCOUNTER — Telehealth: Payer: Self-pay | Admitting: Vascular Surgery

## 2014-02-09 DIAGNOSIS — E139 Other specified diabetes mellitus without complications: Secondary | ICD-10-CM

## 2014-02-09 LAB — RENAL FUNCTION PANEL
ANION GAP: 16 — AB (ref 5–15)
Albumin: 1.3 g/dL — ABNORMAL LOW (ref 3.5–5.2)
BUN: 21 mg/dL (ref 6–23)
CALCIUM: 7.7 mg/dL — AB (ref 8.4–10.5)
CO2: 23 meq/L (ref 19–32)
Chloride: 98 mEq/L (ref 96–112)
Creatinine, Ser: 5.36 mg/dL — ABNORMAL HIGH (ref 0.50–1.35)
GFR calc non Af Amer: 9 mL/min — ABNORMAL LOW (ref >90)
GFR, EST AFRICAN AMERICAN: 11 mL/min — AB (ref >90)
Glucose, Bld: 79 mg/dL (ref 70–99)
Phosphorus: 3.3 mg/dL (ref 2.3–4.6)
Potassium: 3.8 mEq/L (ref 3.7–5.3)
SODIUM: 137 meq/L (ref 137–147)

## 2014-02-09 LAB — GLUCOSE, CAPILLARY
GLUCOSE-CAPILLARY: 78 mg/dL (ref 70–99)
Glucose-Capillary: 90 mg/dL (ref 70–99)

## 2014-02-09 LAB — CBC
HCT: 29.9 % — ABNORMAL LOW (ref 39.0–52.0)
HEMOGLOBIN: 9.5 g/dL — AB (ref 13.0–17.0)
MCH: 26.9 pg (ref 26.0–34.0)
MCHC: 31.8 g/dL (ref 30.0–36.0)
MCV: 84.7 fL (ref 78.0–100.0)
Platelets: 337 10*3/uL (ref 150–400)
RBC: 3.53 MIL/uL — AB (ref 4.22–5.81)
RDW: 19.6 % — ABNORMAL HIGH (ref 11.5–15.5)
WBC: 10.3 10*3/uL (ref 4.0–10.5)

## 2014-02-09 MED ORDER — SODIUM CHLORIDE 0.9 % IV SOLN: 100.0000 mL | INTRAVENOUS | Status: DC | PRN

## 2014-02-09 MED ORDER — NEPRO/CARBSTEADY PO LIQD
237.0000 mL | ORAL | Status: DC | PRN
  Filled 2014-02-09: qty 237

## 2014-02-09 MED ORDER — RENA-VITE PO TABS
1.0000 | ORAL_TABLET | Freq: Every day | ORAL | Status: DC
  Filled 2014-02-09: qty 1

## 2014-02-09 MED ORDER — HEPARIN SODIUM (PORCINE) 1000 UNIT/ML DIALYSIS: 1000.0000 [IU] | INTRAMUSCULAR | Status: DC | PRN

## 2014-02-09 MED ORDER — ALTEPLASE 2 MG IJ SOLR
2.0000 mg | Freq: Once | INTRAMUSCULAR | Status: DC | PRN
  Filled 2014-02-09: qty 2

## 2014-02-09 MED ORDER — LIDOCAINE-PRILOCAINE 2.5-2.5 % EX CREA: 1.0000 "application " | TOPICAL_CREAM | CUTANEOUS | Status: DC | PRN

## 2014-02-09 MED ORDER — RENA-VITE PO TABS: 1.0000 | ORAL_TABLET | Freq: Every day | ORAL | Status: DC

## 2014-02-09 MED ORDER — CIPROFLOXACIN HCL 500 MG PO TABS: 500.0000 mg | ORAL_TABLET | Freq: Every day | ORAL | Status: DC

## 2014-02-09 MED ORDER — METRONIDAZOLE 500 MG PO TABS: 500.0000 mg | ORAL_TABLET | Freq: Three times a day (TID) | ORAL | Status: DC

## 2014-02-09 MED ORDER — LIDOCAINE HCL (PF) 1 % IJ SOLN: 5.0000 mL | INTRAMUSCULAR | Status: DC | PRN

## 2014-02-09 MED ORDER — PENTAFLUOROPROP-TETRAFLUOROETH EX AERO: 1.0000 "application " | INHALATION_SPRAY | CUTANEOUS | Status: DC | PRN

## 2014-02-09 NOTE — Progress Notes (Addendum)
  Progress Note    02/09/2014 10:20 AM 7 Days Post-Op  Subjective:  No complaints on HD   Filed Vitals:   02/09/14 1000  BP: 93/54  Pulse: 86  Temp:   Resp:     Physical Exam: Incisions:  Covered with retention sock and bandage Extremities:  Moving stump well  CBC    Component Value Date/Time   WBC 10.3 02/09/2014 0500   RBC 3.53* 02/09/2014 0500   RBC 3.67* 09/19/2011 1356   HGB 9.5* 02/09/2014 0500   HCT 29.9* 02/09/2014 0500   PLT 337 02/09/2014 0500   MCV 84.7 02/09/2014 0500   MCH 26.9 02/09/2014 0500   MCHC 31.8 02/09/2014 0500   RDW 19.6* 02/09/2014 0500   LYMPHSABS 0.9 01/27/2014 2100   MONOABS 1.8* 01/27/2014 2100   EOSABS 0.1 01/27/2014 2100   BASOSABS 0.0 01/27/2014 2100    BMET    Component Value Date/Time   NA 137 02/09/2014 0500   K 3.8 02/09/2014 0500   CL 98 02/09/2014 0500   CO2 23 02/09/2014 0500   GLUCOSE 79 02/09/2014 0500   BUN 21 02/09/2014 0500   CREATININE 5.36* 02/09/2014 0500   CALCIUM 7.7* 02/09/2014 0500   GFRNONAA 9* 02/09/2014 0500   GFRAA 11* 02/09/2014 0500    INR    Component Value Date/Time   INR 1.86* 02/04/2014 0930     Intake/Output Summary (Last 24 hours) at 02/09/14 1020 Last data filed at 02/09/14 0803  Gross per 24 hour  Intake    360 ml  Output      0 ml  Net    360 ml     Assessment/Plan:  78 y.o. male is s/p left above knee amputation  7 Days Post-Op  -pt doing well today -pain is well controlled -pt with retention sock on and adhesive dressing in place.  Will take dressing down when pt not in HD.  He is moving his stump well.   Leontine Locket, PA-C [facility] [phone] 02/09/2014 10:20 AM   Scant drainage from AKA.  Overall healing well.  Severe protein calorie malnutrition need to supplement to promote wound healing last serum albumin <2  Will see again next week if not discharged otherwise will follow up as outpt in 3 weeks  Ruta Hinds, MD [facility]  [place]: [phone] Pager: [phone]

## 2014-02-09 NOTE — Telephone Encounter (Signed)
Lm for pt re appt, dm

## 2014-02-09 NOTE — Progress Notes (Signed)
7 Days Post-Op  Subjective: Comfortable Tolerating po  Objective: Vital signs in last 24 hours: Temp:  [97.5 F (36.4 C)-98.1 F (36.7 C)] 97.5 F (36.4 C) (08/31 0728) Pulse Rate:  [88-92] 88 (08/31 0728) Resp:  [18-20] 18 (08/31 0728) BP: (112-122)/(59-66) 117/66 mmHg (08/31 0728) SpO2:  [96 %-98 %] 98 % (08/31 0728) Weight:  [182 lb 11.2 oz (82.872 kg)-186 lb 15.2 oz (84.8 kg)] 186 lb 15.2 oz (84.8 kg) (08/31 0728) Last BM Date: 02/07/14  Intake/Output from previous day: 08/30 0701 - 08/31 0700 In: 600 [P.O.:600] Out: 0  Intake/Output this shift:    Abdomen soft, drain bilous  Lab Results:   Recent Labs  02/07/14 0358  WBC 9.2  HGB 9.3*  HCT 29.7*  PLT 345   BMET  Recent Labs  02/07/14 0358  NA 140  K 3.9  CL 101  CO2 25  GLUCOSE 85  BUN 11  CREATININE 3.07*  CALCIUM 8.0*   PT/INR No results found for this basename: LABPROT, INR,  in the last 72 hours ABG No results found for this basename: PHART, PCO2, PO2, HCO3,  in the last 72 hours  Studies/Results: No results found.  Anti-infectives: Anti-infectives   Start     Dose/Rate Route Frequency Ordered Stop   02/07/14 0930  ciprofloxacin (CIPRO) tablet 500 mg     500 mg Oral Daily with breakfast 02/07/14 0823     02/07/14 0930  metroNIDAZOLE (FLAGYL) tablet 500 mg     500 mg Oral 3 times per day 02/07/14 0823     02/04/14 1200  vancomycin (VANCOCIN) IVPB 1000 mg/200 mL premix  Status:  Discontinued     1,000 mg 200 mL/hr over 60 Minutes Intravenous Every M-W-F (Hemodialysis) 02/01/14 0819 02/04/14 0931   02/04/14 1200  vancomycin (VANCOCIN) IVPB 750 mg/150 ml premix  Status:  Discontinued     750 mg 150 mL/hr over 60 Minutes Intravenous Every M-W-F (Hemodialysis) 02/04/14 0931 02/05/14 1055   02/03/14 1200  vancomycin (VANCOCIN) IVPB 1000 mg/200 mL premix  Status:  Discontinued     1,000 mg 200 mL/hr over 60 Minutes Intravenous  Once 02/02/14 1348 02/03/14 1610   01/31/14 1200  vancomycin  (VANCOCIN) IVPB 1000 mg/200 mL premix     1,000 mg 200 mL/hr over 60 Minutes Intravenous  Once 01/29/14 1329 01/31/14 1111   01/28/14 1200  vancomycin (VANCOCIN) IVPB 1000 mg/200 mL premix  Status:  Discontinued     1,000 mg 200 mL/hr over 60 Minutes Intravenous Every M-W-F (Hemodialysis) 01/27/14 2005 02/01/14 0819   01/28/14 0430  piperacillin-tazobactam (ZOSYN) IVPB 2.25 g  Status:  Discontinued     2.25 g 100 mL/hr over 30 Minutes Intravenous 3 times per day 01/27/14 2005 02/07/14 0823   01/27/14 2015  vancomycin (VANCOCIN) 2,000 mg in sodium chloride 0.9 % 500 mL IVPB     2,000 mg 250 mL/hr over 120 Minutes Intravenous  Once 01/27/14 2005 01/27/14 2336   01/27/14 2015  piperacillin-tazobactam (ZOSYN) IVPB 3.375 g     3.375 g 100 mL/hr over 30 Minutes Intravenous  Once 01/27/14 2005 01/28/14 0430      Assessment/Plan: s/p Procedure(s): AMPUTATION ABOVE KNEE-LEFT (Left)  Continuing perc drain.  Plan to leave in place 4 to 6 weeks.  Will be following up with Dr. Grandville Silos or [place] as outpt.  LOS: 13 days    Deontrey Massi A 02/09/2014

## 2014-02-09 NOTE — Discharge Summary (Signed)
Physician Discharge Summary  CORDERRO KOLOSKI DXI:[id number] DOB: Apr 16, 1936 DOA: 01/27/2014  PCP: Philis Fendt, MD  Admit date: 01/27/2014 Discharge date: 02/09/2014  Time spent: 45 minutes  Recommendations for Outpatient Follow-up:  1. Dr.Fields in 2 weeks 2. Dr.Thompson ([facility]) in 4-6 weeks for Interval cholecystectomy 3. Drain care-flush perc cholecystostomy drain 3 times a day 4. Resume Low dose Coreg as BP tolerates 5. Wound Care: Apply dry 4x4 kerlix, Wrap with ACE wrap once daily  Discharge Diagnoses:    Septic shock   Demand ischemia   Septic shock(785.52)   Acute Cholecystitis s/p Perc drain   NSTEMI (non-ST elevated myocardial infarction)   Acute blood loss anemia   Malnutrition of moderate degree   Severe calciphylaxis wounds of L Leg   ESRD on HD   Hyperparathyroidism   H/o DM   H/o CVA    CAD/Ischemic cardiomyopathy 45%  Discharge Condition: stable  Diet recommendation: Renal  Filed Weights   02/08/14 0603 02/09/14 0536 02/09/14 0728  Weight: 83.5 kg (184 lb 1.4 oz) 82.872 kg (182 lb 11.2 oz) 84.8 kg (186 lb 15.2 oz)    History of present illness:  Sean [name] is a 78 y.o. male, with past medical history significant for end-stage renal disease with hyperparathyroidism and severe calciphylaxis presented for evaluation of an advanced left leg wound . The patient was admitted and treated for right leg cellulitis and sepsis and was discharged 8 days ago. Patient was started on vancomycin and Zosyn in the emergency room after he was noted to have a temperature of 100.1. Patient was confused and cannot give any significant history. He has history of coronary artery disease and was admitted on July 1 of this year with a cardiac event. Nuclear stress test did not show any ischemia but showed an ejection fraction of 25%   Hospital Course:  1. Septic shock  -was admitted and in ICU for 9days, required pressors initially -improved, stable off pressors  -due to  acute cholecystitis, seen by Surgery -s/p perc chole drain 8/27, plan for interval cholecystectomy in 6-8weeks  -[facility] following, diet advanced to regular, afebrile, WBC normal now  -blood Cx negative  -Stopped IV Zosyn on day 10, transitioned to PO CIpro/Flagyl 8/29 continue for 3 more days   2. L leg chronic wound due to calciphylaxis with acute worsening  -s/p 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  -Wound care, FU with Dr.Fields   3. Elevated Troponin  -seen by Cards, felt to be demand ischemia  -started on ASA, continue statin, add low dose BB when BP tolerates   4. ESRD on HD  -continued on HD per Renal  5. Anemia of chronic disease  -cont aranesp   6. Secondary hyperparathyroidism   7. DM  -CBGs 90-110s, hbaic 5.9   8.Hx old CVA / underlying mild dementia   9. CAD - EF 25% by Myoview last admit July, no ischemia , cath was recommended but pt declined   Procedures: 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  Percutaneous cholecystostomy drain 8/26  Discharge Exam: Filed Vitals:   02/09/14 1150  BP: 118/73  Pulse: 87  Temp: 97.5 F (36.4 C)  Resp:     General: AAOx3 Cardiovascular: S1S2/RRR Respiratory: CTAB  Discharge Instructions You were cared for by a hospitalist during your hospital stay. If you have any questions about your discharge medications or the care you received while you were in the hospital after you are discharged, you can call the unit and asked to speak with the hospitalist on call  if the hospitalist that took care of you is not available. Once you are discharged, your primary care physician will handle any further medical issues. Please note that NO REFILLS for any discharge medications will be authorized once you are discharged, as it is imperative that you return to your primary care physician (or establish a relationship with a primary care physician if you do not have one) for your aftercare needs so that they can reassess your need for medications and monitor your lab values.  Discharge  Instructions   Discharge instructions    Complete by:  As directed   Renal Diet     Increase activity slowly    Complete by:  As directed             Medication List    STOP taking these medications       carvedilol 3.125 MG tablet  Commonly known as:  COREG     collagenase ointment  Commonly known as:  SANTYL     hydrOXYzine 10 MG tablet  Commonly known as:  ATARAX/VISTARIL     isosorbide-hydrALAZINE 20-37.5 MG per tablet  Commonly known as:  BIDIL     levofloxacin 500 MG tablet  Commonly known as:  LEVAQUIN     PARoxetine 20 MG tablet  Commonly known as:  PAXIL      TAKE these medications       acetaminophen 325 MG tablet  Commonly known as:  TYLENOL  Take 2 tablets (650 mg total) by mouth every 6 (six) hours as needed for mild pain, fever or headache.     albuterol (2.5 MG/3ML) 0.083% nebulizer solution  Commonly known as:  PROVENTIL  Take 3 mLs (2.5 mg total) by nebulization every 6 (six) hours as needed for wheezing or shortness of breath.     aspirin 325 MG tablet  Take 325 mg by mouth every evening.     atorvastatin 20 MG tablet  Commonly known as:  LIPITOR  Take 20 mg by mouth at bedtime.     b complex-vitamin c-folic acid 0.8 MG Tabs tablet  Take 0.8 mg by mouth at bedtime.     multivitamin Tabs tablet  Take 1 tablet by mouth at bedtime.     ciprofloxacin 500 MG tablet  Commonly known as:  CIPRO  Take 1 tablet (500 mg total) by mouth daily with breakfast. For 3days     feeding supplement (RESOURCE BREEZE) Liqd  Take 1 Container by mouth 2 (two) times daily between meals.     HYDROcodone-acetaminophen 5-325 MG per tablet  Commonly known as:  NORCO/VICODIN  Take 1 tablet by mouth every 6 (six) hours as needed for moderate pain or severe pain.     metroNIDAZOLE 500 MG tablet  Commonly known as:  FLAGYL  Take 1 tablet (500 mg total) by mouth every 8 (eight) hours. For 3days       Allergies  Allergen Reactions  . Benazepril Hcl Hives,  Itching and Rash    flareups on patient head       Follow-up Information   Follow up with Elam Dutch, MD In 3 weeks. (office will call you to arrange an appointment (sent))    Specialty:  Vascular Surgery   Contact information:   [address] [place] [address] [phone]        The results of significant diagnostics from this hospitalization (including imaging, microbiology, ancillary and laboratory) are listed below for reference.    Significant Diagnostic Studies: Ct  Head Wo Contrast  02/03/2014   CLINICAL DATA:  Mental status change.  EXAM: CT HEAD WITHOUT CONTRAST  TECHNIQUE: Contiguous axial images were obtained from the base of the skull through the vertex without intravenous contrast.  COMPARISON:  CT 01/17/2014.  12/13/2013.  FINDINGS: No intra-axial or extra-axial pathologic fluid or blood collection. Left occipital encephalomalacia. White matter changes consistent with chronic ischemia. Cerebral vascular disease. Visualized orbits and paranasal sinuses are clear. Mastoids are clear. No acute bony abnormality.  IMPRESSION: 1. Old left occipital infarct. Chronic white matter ischemic change. Diffuse atrophy. 2. No acute abnormality.  Exam stable from prior exams.   Electronically Signed   By: Marcello Moores  Register   On: 02/03/2014 12:31   Ct Head Wo Contrast  01/17/2014   CLINICAL DATA:  Altered mental status.  Weakness.  Dialysis patient.  EXAM: CT HEAD WITHOUT CONTRAST  TECHNIQUE: Contiguous axial images were obtained from the base of the skull through the vertex without intravenous contrast.  COMPARISON:  12/13/2013.  FINDINGS: Stable cerebral atrophy, ventriculomegaly and periventricular white matter disease. Remote left occipital infarct with encephalomalacia ex vacuo dilatation of the occipital horn of the left lateral ventricle. No extra-axial fluid collections are identified. No CT findings for acute hemispheric infarction or intracranial hemorrhage. No mass lesions. The  brainstem and cerebellum are grossly normal and stable.  Stable dense vascular calcifications. The bony structures are intact. No fracture or bone lesion. The paranasal sinuses and mastoid air cells are clear. The globes are intact.  IMPRESSION: Stable atrophy, ventriculomegaly and periventricular white matter disease.  Remote left occipital infarct.  No acute intracranial findings or mass lesions.   Electronically Signed   By: Kalman Jewels M.D.   On: 01/17/2014 08:28   Ct Abdomen Pelvis W Contrast  02/03/2014   CLINICAL DATA:  Right upper quadrant abdominal pain. Possible colonic ischemia. Patient has a history of dialysis.  EXAM: CT ABDOMEN AND PELVIS WITH CONTRAST  TECHNIQUE: Multidetector CT imaging of the abdomen and pelvis was performed using the standard protocol following bolus administration of intravenous contrast.  CONTRAST:  113mL OMNIPAQUE IOHEXOL 300 MG/ML  SOLN  COMPARISON:  12/09/2011.  FINDINGS: Gallbladder is distended with wall thickening and pericholecystic inflammation/edema. There are dependent small stones. Findings are consistent with acute cholecystitis given history.  Heart is mild to moderately enlarged. There is dependent subsegmental atelectasis.  Liver and spleen are unremarkable. Common bile duct is normal in caliber for age. No pancreatic masses or inflammatory change. No adrenal masses.  Diffuse bilateral renal cortical thinning. Multiple low-density renal masses consistent with cysts. No hydronephrosis. Normal ureters. Bladder is decompressed.  No pathologically enlarged lymph nodes.  No ascites.  There are atherosclerotic vascular calcifications throughout the aorta and its branch vessels. Significant stenosis is noted throughout the superior mesenteric a arteries/celiac axis. The superior mesenteric artery and celiac axis appear to originate from a common trunk from the aorta.  There are changes from previous right colon surgery. There is no bowel wall thickening or bowel  wall air. No evidence of colonic or small bowel ischemia.  There is a small anterior abdominal wall fat containing hernia adjacent to a midline incision.  Bone show changes consistent with renal osteodystrophy. There are prominent subchondral cysts along both superior acetabula.  IMPRESSION: 1. Acute cholecystitis.  No evidence of ischemic bowel. 2. Multiple chronic findings as detailed. No other evidence of an acute abnormality.   Electronically Signed   By: Lajean Manes M.D.   On: 02/03/2014 12:35  Ir Fluoro Guide Cv Line Right  01/29/2014   CLINICAL DATA:  Leg infection  EXAM: RIGHT INTERNAL JUGULAR PICC LINE PLACEMENT WITH ULTRASOUND AND FLUOROSCOPIC GUIDANCE  FLUOROSCOPY TIME:  1 min and 18 seconds.  PROCEDURE: The patient was advised of the possible risks andcomplications and agreed to undergo the procedure. The patient was then brought to the angiographic suite for the procedure.  The right neck was prepped with chlorhexidine, drapedin the usual sterile fashion using maximum barrier technique (cap and mask, sterile gown, sterile gloves, large sterile sheet, hand hygiene and cutaneous antisepsis) and infiltrated locally with 1% Lidocaine.  Ultrasound demonstrated patency of the right internal jugular vein, and this was documented with an image. Under real-time ultrasound guidance, this vein was accessed with a 21 gauge micropuncture needle and image documentation was performed. A 0.018 wire was introduced in to the vein. Over this, a 5 [place] double lumen Power PICC was advanced to the lower SVC/right atrial junction. Fluoroscopy during the procedure and fluoro spot radiograph confirms appropriate catheter position. The catheter was flushed and covered with asterile dressing.  Complications: None  IMPRESSION: Successful right arm Power PICC line placement with ultrasound and fluoroscopic guidance. The catheter is ready for use.   Electronically Signed   By: Maryclare Bean M.D.   On: 01/29/2014 17:00   Ir  Perc Cholecystostomy  02/04/2014   CLINICAL DATA:  Acute cholecystitis, sepsis, percutaneous drainage requested by surgery.  EXAM: PERCUTANEOUS CHOLECYSTOSTOMY TUBE PLACEMENT WITH ULTRASOUND AND FLUOROSCOPIC GUIDANCE:  FLUOROSCOPY TIME:  1 min 12  TECHNIQUE: The procedure, risks (including but not limited to bleeding, infection, organ damage ), benefits, and alternatives were explained to the family. Questions regarding the procedure were encouraged and answered. The family understands and consents to the procedure. Survey ultrasound of the abdomen was performed and an appropriate skin entry site was identified. Skin site was marked, prepped with Betadine, and draped in usual sterile fashion, and infiltrated locally with 1% lidocaine.  Intravenous Fentanyl and Versed were administered as conscious sedation during continuous cardiorespiratory monitoring by the radiology RN, with a total moderate sedation time of 7 minutes.  Under real-time ultrasound guidance, gallbladder was accessed using a transhepatic approach with a 21-gauge needle. Ultrasound image documentation was saved. Bile could be aspirated through the hub. Needle was exchanged over a 018 guidewire for transitional dilator which allowed placement of 035 J wire. Over this, a 10.2 French pigtail catheter was advanced and formed centrally in the gallbladder lumen. 20 mL of dark bile were aspirated, sent for culture. Small contrast injection confirmed appropriate position. Catheter secured externally with 0 Prolene suture and placed to external drain bag. Patient tolerated the procedure well, with no immediate complication.  IMPRESSION: 1. Technically successful percutaneous cholecystostomy tube placement with ultrasound and fluoroscopic guidance.   Electronically Signed   By: Arne Cleveland M.D.   On: 02/04/2014 14:53   Ir US Guide Vasc Access Right  01/29/2014   CLINICAL DATA:  Leg infection  EXAM: RIGHT INTERNAL JUGULAR PICC LINE PLACEMENT WITH  ULTRASOUND AND FLUOROSCOPIC GUIDANCE  FLUOROSCOPY TIME:  1 min and 18 seconds.  PROCEDURE: The patient was advised of the possible risks andcomplications and agreed to undergo the procedure. The patient was then brought to the angiographic suite for the procedure.  The right neck was prepped with chlorhexidine, drapedin the usual sterile fashion using maximum barrier technique (cap and mask, sterile gown, sterile gloves, large sterile sheet, hand hygiene and cutaneous antisepsis) and infiltrated locally with 1%  Lidocaine.  Ultrasound demonstrated patency of the right internal jugular vein, and this was documented with an image. Under real-time ultrasound guidance, this vein was accessed with a 21 gauge micropuncture needle and image documentation was performed. A 0.018 wire was introduced in to the vein. Over this, a 5 [place] double lumen Power PICC was advanced to the lower SVC/right atrial junction. Fluoroscopy during the procedure and fluoro spot radiograph confirms appropriate catheter position. The catheter was flushed and covered with asterile dressing.  Complications: None  IMPRESSION: Successful right arm Power PICC line placement with ultrasound and fluoroscopic guidance. The catheter is ready for use.   Electronically Signed   By: Maryclare Bean M.D.   On: 01/29/2014 17:00   Dg Chest Port 1 View  02/03/2014   CLINICAL DATA:  Assess airspace disease  EXAM: PORTABLE CHEST - 1 VIEW  COMPARISON:  01/27/2014  FINDINGS: Mild enlargement of the cardiac silhouette, stable. The aorta is uncoiled. No mediastinal or hilar masses.  Mild hazy left lung base opacity, most likely atelectasis. Infiltrate is possible. No pulmonary edema. No pleural effusion or pneumothorax.  Right central venous line has its tip near the caval atrial junction, well positioned.  IMPRESSION: Mild hazy opacity at the left lung base. This is likely atelectasis. A small pneumonia is possible. This is similar to opacity noted on the prior study,  which supports atelectasis. Lungs otherwise clear.   Electronically Signed   By: Lajean Manes M.D.   On: 02/03/2014 12:54   Dg Chest Port 1 View  01/27/2014   CLINICAL DATA:  Fever.  Wound infection.  EXAM: PORTABLE CHEST - 1 VIEW  COMPARISON:  01/17/2014  FINDINGS: There is tortuosity and calcification of the thoracic aorta. There are no infiltrates or effusions. Aeration has improved bilaterally. No acute osseous abnormality.  IMPRESSION: Resolution of interstitial edema.   Electronically Signed   By: Rozetta Nunnery M.D.   On: 01/27/2014 19:30   Dg Chest Port 1 View  01/17/2014   CLINICAL DATA:  Weakness.  History of end-stage renal disease.  EXAM: PORTABLE CHEST - 1 VIEW  COMPARISON:  12/20/2013  FINDINGS: There is stable mild cardiac enlargement. Lungs show evidence of mild interstitial edema. There is no evidence of airspace consolidation, pneumothorax, nodule or pleural fluid.  IMPRESSION: Cardiomegaly and mild interstitial edema.   Electronically Signed   By: Aletta Edouard M.D.   On: 01/17/2014 09:41    Microbiology: Recent Results (from the past 240 hour(s))  CULTURE, BLOOD (ROUTINE X 2)     Status: None   Collection Time    01/31/14  4:28 PM      Result Value Ref Range Status   Specimen Description BLOOD RIGHT HAND   Final   Special Requests BOTTLES DRAWN AEROBIC AND ANAEROBIC 10 CC   Final   Culture  Setup Time     Final   Value: 02/01/2014 00:09     Performed at [facility]   Culture     Final   Value: NO GROWTH 5 DAYS     Performed at [facility]   Report Status 02/07/2014 FINAL   Final  CULTURE, BLOOD (ROUTINE X 2)     Status: None   Collection Time    02/01/14  7:22 AM      Result Value Ref Range Status   Specimen Description BLOOD RIGHT HAND   Final   Special Requests BOTTLES DRAWN AEROBIC AND ANAEROBIC [facility] EACH   Final  Culture  Setup Time     Final   Value: 02/01/2014 17:45     Performed at [facility]   Culture     Final   Value: NO  GROWTH 5 DAYS     Performed at [facility]   Report Status 02/07/2014 FINAL   Final  SURGICAL PCR SCREEN     Status: Abnormal   Collection Time    02/02/14  5:04 AM      Result Value Ref Range Status   MRSA, PCR NEGATIVE  NEGATIVE Final   Staphylococcus aureus POSITIVE (*) NEGATIVE Final   Comment:            The Xpert SA Assay (FDA     approved for NASAL specimens     in patients over 73 years of age),     is one component of     a comprehensive surveillance     program.  Test performance has     been validated by [employer] for patients greater     than or equal to 53 year old.     It is not intended     to diagnose infection nor to     guide or monitor treatment.  MRSA PCR SCREENING     Status: None   Collection Time    02/03/14 10:47 AM      Result Value Ref Range Status   MRSA by PCR NEGATIVE  NEGATIVE Final   Comment:            The GeneXpert MRSA Assay (FDA     approved for NASAL specimens     only), is one component of a     comprehensive MRSA colonization     surveillance program. It is not     intended to diagnose MRSA     infection nor to guide or     monitor treatment for     MRSA infections.  BODY FLUID CULTURE     Status: None   Collection Time    02/04/14  2:42 PM      Result Value Ref Range Status   Specimen Description FLUID GALL BLADDER   Final   Special Requests NONE   Final   Gram Stain     Final   Value: NO WBC SEEN     NO ORGANISMS SEEN     Performed at [facility]   Culture     Final   Value: NO GROWTH 3 DAYS     Performed at [facility]   Report Status 02/07/2014 FINAL   Final     Labs: Basic Metabolic Panel:  Recent Labs Lab 02/03/14 0711 02/04/14 0350 02/05/14 0300 02/06/14 0500 02/07/14 0358 02/09/14 0500  NA 137 137 139 136* 140 137  K 4.6 4.0 3.4* 3.5* 3.9 3.8  CL 97 98 98 98 101 98  CO2 25 23 25 24 25 23   GLUCOSE 121* 111* 89 110* 85 79  BUN 22 27* 16 23 11 21   CREATININE 5.31* 6.02*  4.01* 5.09* 3.07* 5.36*  CALCIUM 8.3* 8.4 8.0* 8.0* 8.0* 7.7*  MG  --  1.9  --   --   --   --   PHOS 3.5 4.0  --   --   --  3.3   Liver Function Tests:  Recent Labs Lab 02/03/14 0711 02/04/14 0350 02/05/14 0300 02/09/14 0500  AST  --  36 38*  --  ALT  --  <5 <5  --   ALKPHOS  --  88 86  --   BILITOT  --  0.7 0.5  --   PROT  --  6.3 5.8*  --   ALBUMIN 1.4* 1.3* 1.2* 1.3*   No results found for this basename: LIPASE, AMYLASE,  in the last 168 hours No results found for this basename: AMMONIA,  in the last 168 hours CBC:  Recent Labs Lab 02/04/14 0350 02/05/14 0300 02/06/14 0500 02/07/14 0358 02/09/14 0500  WBC 23.2* 13.1* 11.4* 9.2 10.3  HGB 8.9* 8.8* 9.3* 9.3* 9.5*  HCT 27.7* 27.8* 28.9* 29.7* 29.9*  MCV 84.2 85.0 83.8 85.1 84.7  PLT 384 334 324 345 337   Cardiac Enzymes:  Recent Labs Lab 02/03/14 1030 02/03/14 1540 02/03/14 2257  CKTOTAL 157  --   --   CKMB 3.2  --   --   TROPONINI 1.48* 2.58* 4.39*   BNP: BNP (last 3 results)  Recent Labs  12/13/13 1408 01/17/14 0800  PROBNP >70000.0* >70000.0*   CBG:  Recent Labs Lab 02/08/14 0605 02/08/14 1347 02/08/14 1620 02/08/14 2057 02/09/14 0600  GLUCAP 109* 78 89 105* 90       Signed:  Joy Reiger  [facility] 02/09/2014, 12:02 PM

## 2014-02-09 NOTE — Progress Notes (Signed)
Subjective: Interval History: has no complaint .  Objective: Vital signs in last 24 hours: Temp:  [97.5 F (36.4 C)-98.1 F (36.7 C)] 97.5 F (36.4 C) (08/31 0728) Pulse Rate:  [86-92] 86 (08/31 0830) Resp:  [13-20] 13 (08/31 0830) BP: (92-122)/(57-66) 101/63 mmHg (08/31 0830) SpO2:  [96 %-98 %] 98 % (08/31 0728) Weight:  [82.872 kg (182 lb 11.2 oz)-84.8 kg (186 lb 15.2 oz)] 84.8 kg (186 lb 15.2 oz) (08/31 0728) Weight change: -0.628 kg (-1 lb 6.2 oz)  Intake/Output from previous day: 08/30 0701 - 08/31 0700 In: 600 [P.O.:600] Out: 0  Intake/Output this shift:    General appearance: alert, cooperative and mildly obese Resp: diminished breath sounds bilaterally Breasts: not eval GI: soft,RUQ drain,,pos bs Extremities: L AKA, LLA avg CV reg Gr 2/6 holosys M  Lab Results:  Recent Labs  02/07/14 0358 02/09/14 0500  WBC 9.2 10.3  HGB 9.3* 9.5*  HCT 29.7* 29.9*  PLT 345 337   BMET:  Recent Labs  02/07/14 0358 02/09/14 0500  NA 140 137  K 3.9 3.8  CL 101 98  CO2 25 23  GLUCOSE 85 79  BUN 11 21  CREATININE 3.07* 5.36*  CALCIUM 8.0* 7.7*   No results found for this basename: PTH,  in the last 72 hours Iron Studies: No results found for this basename: IRON, TIBC, TRANSFERRIN, FERRITIN,  in the last 72 hours  Studies/Results: No results found.  I have reviewed the patient's current medications.  Assessment/Plan: 1 ESRD on Hd, tol well 2 BP better off meds 3 Anemia stable 4 Cholecys drain now 5 DM 6 Dementia stable 9 PVD S/P AKA 10 Sepsis secondary to chole 11CAD 12 HPTH 13 Press sores P HD, chole in 6 wk,  Epo, vit D     LOS: 13 days   Shaine Newmark L 02/09/2014,8:39 AM

## 2014-02-09 NOTE — Telephone Encounter (Signed)
Message copied by Gena Fray on Mon Feb 09, 2014  3:01 PM ------      Message from: [place], [place] K      Created: Mon Feb 09, 2014  1:59 PM      Regarding: Schedule                   ----- Message -----         From: Alvia Grove, PA-C         Sent: 02/09/2014  11:42 AM           To: [facility] Charge Pool            S/p left AKA 02/02/14            F/u with Dr. Oneida Alar in 3 weeks.            Thanks,      Maudie Mercury ------

## 2014-02-09 NOTE — Clinical Social Work Psych Note (Signed)
Pt to dc to [facility] Report to be called to [phone] Transportation: PTAR scheduled for 3:30pm Family/pt notified by unit CSW. DC summary sent to SNF.  [name], [place] [phone]  Psychiatric & Orthopedics (5N 1-16) Clinical Social Worker

## 2014-02-09 NOTE — Progress Notes (Signed)
PT Cancellation Note  Patient Details Name: Sean [name] MRN: [id number] DOB: June 17, 1935   Cancelled Treatment:    Reason Eval/Treat Not Completed: Fatigue/lethargy limiting ability to participate (Pt stated he's worn out from HD. )   [name] 02/09/2014, 2:05 PM [phone]

## 2014-02-09 NOTE — Progress Notes (Signed)
Rt given to [facility] , Nurse from [facility]

## 2014-02-09 NOTE — Procedures (Signed)
I was present at this session.  I have reviewed the session itself and made appropriate changes. HD via LLA AVG. bp low 100s  Sean [name] L 8/31/20158:38 AM

## 2014-02-10 NOTE — Clinical Social Work Placement (Addendum)
    Clinical Social Work Department CLINICAL SOCIAL WORK PLACEMENT NOTE 02/09/2014  Patient:  Sean [name], Sean [name]  Account Number:  [id number] Admit date:  01/27/2014  Clinical Social Worker:  Crawford Givens, LCSW  Date/time:  02/04/2014 03:42 AM  Clinical Social Work is seeking post-discharge placement for this patient at the following level of care:   [place]   (*CSW will update this form in Epic as items are completed)   02/03/2014  Patient/family provided with [place] Department of Clinical Social Work's list of facilities offering this level of care within the geographic area requested by the patient (or if unable, by the patient's family).  02/03/2014  Patient/family informed of their freedom to choose among providers that offer the needed level of care, that participate in Medicare, Medicaid or managed care program needed by the patient, have an available bed and are willing to accept the patient.    Patient/family informed of [facility]' ownership interest in [facility], as well as of the fact that they are under no obligation to receive care at this facility.  PASARR submitted to EDS on 02/03/2014 PASARR number received on 03/06/2014  FL2 transmitted to all facilities in geographic area requested by pt/family on  02/03/2014 FL2 transmitted to all facilities within larger geographic area on   Patient informed that his/her managed care company has contracts with or will negotiate with  certain facilities, including the following:   NA     Patient/family informed of bed offers received:  03/07/2014 Patient chooses bed at [place] Physician recommends and patient chooses bed at    Patient to be transferred to [place] on  02/09/2014 Patient to be transferred to facility by [facility]) Patient and family notified of transfer on 02/09/2014 Name of family member notified:  Keane Police-   daugher  The following physician request were entered in Epic: Physician Request  Please sign FL2.    Additional Comments: 02/09/14  OK per MD for d/c today to SNF. Family and patient chose [facility].  Son Al will be at the SNF at 3:30 to sign admission papers.  Patient had dialysis this morning and has returned to his room. He is agreeable to d/c and and is pleased with facility choice.  Patient's daughter Keane Police is in room with patient and verbalized same sentiment. Nursing notified to call report; EMS arranged for this this afternoon.  CSW signing off.  Lorie Phenix. [place], [place]

## 2014-03-03 ENCOUNTER — Encounter: Payer: Self-pay | Admitting: Vascular Surgery

## 2014-03-04 ENCOUNTER — Ambulatory Visit (INDEPENDENT_AMBULATORY_CARE_PROVIDER_SITE_OTHER): Payer: Medicare Other | Admitting: Vascular Surgery

## 2014-03-04 ENCOUNTER — Encounter: Payer: Self-pay | Admitting: Vascular Surgery

## 2014-03-04 VITALS — BP 90/55 | HR 100 | Resp 18 | Ht 65.0 in | Wt 178.0 lb

## 2014-03-04 DIAGNOSIS — I70209 Unspecified atherosclerosis of native arteries of extremities, unspecified extremity: Secondary | ICD-10-CM

## 2014-03-04 DIAGNOSIS — L98499 Non-pressure chronic ulcer of skin of other sites with unspecified severity: Secondary | ICD-10-CM

## 2014-03-04 DIAGNOSIS — I739 Peripheral vascular disease, unspecified: Secondary | ICD-10-CM

## 2014-03-04 NOTE — Progress Notes (Signed)
Patient is a 78 year old male who is status post left above-knee amputation on August 24. He returns today for followup. He still has a drain in his gallbladder from a cholecystitis he developed in the hospital. The left above-knee amputation still has some mild edema. There is some maceration of the incision in the medial third. Staples were removed today.  Assessment: Healing left above-knee amputation with some skin breakdown medially  Plan: Dry dressing to left above-knee amputation staples out today followup 2 weeks  Ruta Hinds, MD [facility] [place] Office: [phone] Pager: [phone]

## 2014-03-10 ENCOUNTER — Telehealth: Payer: Self-pay | Admitting: *Deleted

## 2014-03-10 NOTE — Telephone Encounter (Signed)
Suanne Marker, nurse from [facility] called stating patients wound from left AKA site is draining and has an odor.  She states that patient was started on Doxycyline today and that he is afebrile. They are to begin normal saline wet to dry dressings bid as ordered 04/04/14.  I instructed for him to keep his scheduled appointment 03/19/14 and to notify us of any worsening changes.

## 2014-03-18 ENCOUNTER — Encounter: Payer: Self-pay | Admitting: Vascular Surgery

## 2014-03-19 ENCOUNTER — Encounter: Payer: Self-pay | Admitting: Vascular Surgery

## 2014-03-19 ENCOUNTER — Ambulatory Visit (INDEPENDENT_AMBULATORY_CARE_PROVIDER_SITE_OTHER): Payer: Medicare Other | Admitting: Vascular Surgery

## 2014-03-19 VITALS — BP 77/47 | HR 106 | Ht 65.0 in | Wt 178.0 lb

## 2014-03-19 DIAGNOSIS — I70209 Unspecified atherosclerosis of native arteries of extremities, unspecified extremity: Secondary | ICD-10-CM

## 2014-03-19 DIAGNOSIS — L97922 Non-pressure chronic ulcer of unspecified part of left lower leg with fat layer exposed: Secondary | ICD-10-CM

## 2014-03-19 DIAGNOSIS — L98499 Non-pressure chronic ulcer of skin of other sites with unspecified severity: Secondary | ICD-10-CM

## 2014-03-19 DIAGNOSIS — I739 Peripheral vascular disease, unspecified: Secondary | ICD-10-CM

## 2014-03-19 NOTE — Progress Notes (Signed)
Patient is a 78 year old male who is status post left above-knee amputation on August 24. He returns today for followup. He has some dementia. 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 He is currently residing in a skilled nursing facility.   The left above-knee amputation still has some mild edema. There is dry dark eschar along the entire incision line however there does appear to be some granulation tissue below this.  Assessment: Slowly Healing left above-knee amputation with some skin breakdown  Plan: Santyl left above-knee amputation.  Hopefully the wound will cone continued to slowly heal. He may have difficulty healing this wound due to his calciphylaxis history. The patient will followup in one month.   Ruta Hinds, MD [facility] [place] Office: [phone] Pager: [phone]

## 2014-03-24 ENCOUNTER — Other Ambulatory Visit (INDEPENDENT_AMBULATORY_CARE_PROVIDER_SITE_OTHER): Payer: Self-pay | Admitting: Surgery

## 2014-03-24 DIAGNOSIS — K819 Cholecystitis, unspecified: Secondary | ICD-10-CM

## 2014-03-25 ENCOUNTER — Telehealth: Payer: Self-pay

## 2014-03-25 ENCOUNTER — Encounter: Payer: Self-pay | Admitting: Vascular Surgery

## 2014-03-25 NOTE — Telephone Encounter (Signed)
rec'd call from nurse @ [facility].  Reported the pt. has worsening drainage from left AKA stump site.  Requested appt. ASAP for evaluation.  Appt. given for 10/15 @ 1:00 PM.  Appt. given to [facility] @ [facility].  Agrees w/ plan.

## 2014-03-26 ENCOUNTER — Ambulatory Visit (INDEPENDENT_AMBULATORY_CARE_PROVIDER_SITE_OTHER): Payer: Self-pay | Admitting: Vascular Surgery

## 2014-03-26 ENCOUNTER — Encounter: Payer: Self-pay | Admitting: Vascular Surgery

## 2014-03-26 VITALS — BP 78/48 | HR 98 | Temp 98.7°F | Resp 22 | Ht 65.0 in | Wt 170.0 lb

## 2014-03-26 DIAGNOSIS — I739 Peripheral vascular disease, unspecified: Secondary | ICD-10-CM

## 2014-03-26 DIAGNOSIS — L98499 Non-pressure chronic ulcer of skin of other sites with unspecified severity: Secondary | ICD-10-CM

## 2014-03-26 DIAGNOSIS — I70209 Unspecified atherosclerosis of native arteries of extremities, unspecified extremity: Secondary | ICD-10-CM

## 2014-03-26 DIAGNOSIS — Z09 Encounter for follow-up examination after completed treatment for conditions other than malignant neoplasm: Secondary | ICD-10-CM

## 2014-03-26 NOTE — Progress Notes (Signed)
Patient is a 78 year old male who is status post left above-knee amputation on August 24. He returns today for followup. He has some dementia. 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 He is currently residing in a skilled nursing facility, [facility].  His dialysis days Monday Wednesday Friday. He has had increasing drainage from the left above-knee amputation. He is completely nonambulatory and several person assist. He is currently receiving Santyl on the wound.  Past Medical History  Diagnosis Date  . Hyperparathyroidism   . Arthritis   . Hyperlipidemia   . Hypertension   . Neuromuscular disorder   . Stroke   . GERD (gastroesophageal reflux disease)   . Migraine   . Heart attack 2011; 1990's  . Type II diabetes mellitus   . Peripheral neuropathy   . Anxiety   . Depression   . Dialysis patient     M, W, F; [place] ESRD (end stage renal disease) on dialysis   . CAD (coronary artery disease)     a. s/p NSTEMI and failed RCA PCI in 2011. b. Nuc 12/2013: nonischemic, prior inferior MI.  Marland Kitchen LBBB (left bundle branch block)   . Calciphylaxis     a. Multiple admissions for nonhealing L ulcer/sepsis, s/p L AKA 02/02/2014.  Marland Kitchen Chronic anemia   . Dementia     Past Surgical History  Procedure Laterality Date  . Colon surgery  2011    colon resection   . Parathyroidectomy  2011  . Capd insertion  09/12/2011    Procedure: LAPAROSCOPIC INSERTION CONTINUOUS AMBULATORY PERITONEAL DIALYSIS  (CAPD) CATHETER;  Surgeon: Edward Jolly, MD;  Location: [place];  Service: General;  Laterality: N/A;  . Sp av dialysis shunt access existing *l*  2003  . Dg av dialysis shunt intro ndl*r* or      "not working" (09/19/11)  . Capd removal  03/12/2012    Procedure: CONTINUOUS AMBULATORY PERITONEAL DIALYSIS  (CAPD) CATHETER REMOVAL;  Surgeon: Edward Jolly, MD;  Location: [place];  Service: General;  Laterality: N/A;  . Amputation Left 02/02/2014    Procedure: AMPUTATION ABOVE KNEE-LEFT;   Surgeon: Elam Dutch, MD;  Location: [facility] OR;  Service: Vascular;  Laterality: Left;    Current Outpatient Prescriptions on File Prior to Visit  Medication Sig Dispense Refill  . acetaminophen (TYLENOL) 325 MG tablet Take 2 tablets (650 mg total) by mouth every 6 (six) hours as needed for mild pain, fever or headache.      . albuterol (PROVENTIL) (2.5 MG/3ML) 0.083% nebulizer solution Take 3 mLs (2.5 mg total) by nebulization every 6 (six) hours as needed for wheezing or shortness of breath.  75 mL  12  . aspirin 325 MG tablet Take 325 mg by mouth every evening.      Marland Kitchen atorvastatin (LIPITOR) 20 MG tablet Take 20 mg by mouth at bedtime.      Marland Kitchen b complex-vitamin c-folic acid (NEPHRO-VITE) 0.8 MG TABS Take 0.8 mg by mouth at bedtime.      . ciprofloxacin (CIPRO) 500 MG tablet Take 1 tablet (500 mg total) by mouth daily with breakfast. For 3days      . feeding supplement, RESOURCE BREEZE, (RESOURCE BREEZE) LIQD Take 1 Container by mouth 2 (two) times daily between meals.    0  . Gabapentin (NEURONTIN PO) Take by mouth daily.      Marland Kitchen HYDROcodone-acetaminophen (NORCO/VICODIN) 5-325 MG per tablet Take 1 tablet by mouth every 6 (six) hours as needed for moderate  pain or severe pain.  120 tablet  0  . multivitamin (RENA-VIT) TABS tablet Take 1 tablet by mouth at bedtime.    0  . ondansetron (ZOFRAN) 4 MG tablet Take 4 mg by mouth every 8 (eight) hours as needed for nausea or vomiting.      Marland Kitchen doxycycline (VIBRAMYCIN) 100 MG capsule Take 100 mg by mouth 2 (two) times daily.      . metroNIDAZOLE (FLAGYL) 500 MG tablet Take 1 tablet (500 mg total) by mouth every 8 (eight) hours. For 3days    0   No current facility-administered medications on file prior to visit.    The left above-knee amputation still has some mild edema. There is dry dark eschar along the entire incision line however there does appear to be some granulation tissue below this.  Assessment: Slowly Healing left above-knee amputation with  some skin breakdown  Plan: We'll plan to debride his above-knee amputation in the operating room next Tuesday, October 20. I discussed this plan with the son today.   Ruta Hinds, MD [facility] [place] Office: [phone] Pager: [phone]

## 2014-03-27 ENCOUNTER — Other Ambulatory Visit (INDEPENDENT_AMBULATORY_CARE_PROVIDER_SITE_OTHER): Payer: Self-pay | Admitting: Surgery

## 2014-03-27 ENCOUNTER — Other Ambulatory Visit: Payer: Self-pay

## 2014-03-27 ENCOUNTER — Ambulatory Visit (HOSPITAL_COMMUNITY)
Admission: RE | Admit: 2014-03-27 | Discharge: 2014-03-27 | Disposition: A | Discharge status: Home / Self Care | Payer: Medicare Other | Source: Ambulatory Visit | Attending: Surgery | Admitting: Surgery

## 2014-03-27 DIAGNOSIS — K810 Acute cholecystitis: Secondary | ICD-10-CM | POA: Diagnosis not present

## 2014-03-27 DIAGNOSIS — K819 Cholecystitis, unspecified: Secondary | ICD-10-CM

## 2014-03-27 MED ORDER — LIDOCAINE HCL 1 % IJ SOLN
INTRAMUSCULAR | Status: AC
Start: 2014-03-27 — End: 2014-03-27
  Filled 2014-03-27: qty 20

## 2014-03-27 MED ORDER — IOHEXOL 300 MG/ML  SOLN
50.0000 mL | Freq: Once | INTRAMUSCULAR | Status: AC | PRN
  Administered 2014-03-27: 25 mL

## 2014-03-27 NOTE — Procedures (Signed)
Interventional Radiology Procedure Note  Procedure: Drain injection to assess patency of the cystic duct, and maturity of the drain tract.   Drain removed today, 03/27/2014, after discussing findings with Dr. Grandville Silos.  Findings:  Cystic duct is patent, with drainage of contrast into the duodenum.  There is no drainage of contrast into the pericholecystic region, with mature tract formed.   Complications: No immediate Recommendations:  Routine dressing changes until skin healed.   Signed,  Dulcy Fanny. Earleen Newport, DO

## 2014-03-30 ENCOUNTER — Encounter (HOSPITAL_COMMUNITY): Payer: Self-pay | Admitting: *Deleted

## 2014-03-30 MED ORDER — CEFUROXIME SODIUM 1.5 G IJ SOLR: 1.5000 g | INTRAMUSCULAR | Status: DC

## 2014-03-30 NOTE — Progress Notes (Signed)
Mr Howell's daughter, [name] will be at the hospital no later than 1100, 03/31/14 to sign consent.

## 2014-03-31 ENCOUNTER — Inpatient Hospital Stay (HOSPITAL_COMMUNITY)
Admission: RE | Admit: 2014-03-31 | Discharge: 2014-04-02 | DRG: 463 | Disposition: A | Discharge status: Skilled Nursing Facility | Payer: Medicare Other | Source: Ambulatory Visit | Attending: Vascular Surgery | Admitting: Vascular Surgery

## 2014-03-31 ENCOUNTER — Encounter (HOSPITAL_COMMUNITY): Payer: Medicare Other | Admitting: Anesthesiology

## 2014-03-31 ENCOUNTER — Encounter (HOSPITAL_COMMUNITY)
Admission: RE | Disposition: A | Discharge status: Skilled Nursing Facility | Payer: Self-pay | Source: Ambulatory Visit | Attending: Vascular Surgery

## 2014-03-31 ENCOUNTER — Ambulatory Visit (HOSPITAL_COMMUNITY): Payer: Medicare Other

## 2014-03-31 ENCOUNTER — Encounter (HOSPITAL_COMMUNITY): Payer: Self-pay | Admitting: *Deleted

## 2014-03-31 ENCOUNTER — Ambulatory Visit (HOSPITAL_COMMUNITY): Payer: Medicare Other | Admitting: Anesthesiology

## 2014-03-31 DIAGNOSIS — T8789 Other complications of amputation stump: Secondary | ICD-10-CM

## 2014-03-31 DIAGNOSIS — F40240 Claustrophobia: Secondary | ICD-10-CM | POA: Diagnosis present

## 2014-03-31 DIAGNOSIS — Y838 Other surgical procedures as the cause of abnormal reaction of the patient, or of later complication, without mention of misadventure at the time of the procedure: Secondary | ICD-10-CM

## 2014-03-31 DIAGNOSIS — N186 End stage renal disease: Secondary | ICD-10-CM | POA: Diagnosis present

## 2014-03-31 DIAGNOSIS — Z01811 Encounter for preprocedural respiratory examination: Secondary | ICD-10-CM

## 2014-03-31 DIAGNOSIS — D649 Anemia, unspecified: Secondary | ICD-10-CM | POA: Diagnosis present

## 2014-03-31 DIAGNOSIS — T8754 Necrosis of amputation stump, left lower extremity: Secondary | ICD-10-CM | POA: Diagnosis present

## 2014-03-31 DIAGNOSIS — Z8673 Personal history of transient ischemic attack (TIA), and cerebral infarction without residual deficits: Secondary | ICD-10-CM

## 2014-03-31 DIAGNOSIS — I959 Hypotension, unspecified: Secondary | ICD-10-CM | POA: Diagnosis present

## 2014-03-31 DIAGNOSIS — Z89512 Acquired absence of left leg below knee: Secondary | ICD-10-CM

## 2014-03-31 DIAGNOSIS — F0390 Unspecified dementia without behavioral disturbance: Secondary | ICD-10-CM | POA: Diagnosis present

## 2014-03-31 DIAGNOSIS — R627 Adult failure to thrive: Secondary | ICD-10-CM | POA: Diagnosis present

## 2014-03-31 DIAGNOSIS — E785 Hyperlipidemia, unspecified: Secondary | ICD-10-CM | POA: Diagnosis present

## 2014-03-31 DIAGNOSIS — T879 Unspecified complications of amputation stump: Secondary | ICD-10-CM

## 2014-03-31 DIAGNOSIS — N2581 Secondary hyperparathyroidism of renal origin: Secondary | ICD-10-CM | POA: Diagnosis present

## 2014-03-31 DIAGNOSIS — E213 Hyperparathyroidism, unspecified: Secondary | ICD-10-CM

## 2014-03-31 DIAGNOSIS — E119 Type 2 diabetes mellitus without complications: Secondary | ICD-10-CM | POA: Diagnosis present

## 2014-03-31 DIAGNOSIS — K219 Gastro-esophageal reflux disease without esophagitis: Secondary | ICD-10-CM | POA: Diagnosis present

## 2014-03-31 DIAGNOSIS — I252 Old myocardial infarction: Secondary | ICD-10-CM | POA: Diagnosis not present

## 2014-03-31 DIAGNOSIS — Z992 Dependence on renal dialysis: Secondary | ICD-10-CM

## 2014-03-31 DIAGNOSIS — T8189XD Other complications of procedures, not elsewhere classified, subsequent encounter: Secondary | ICD-10-CM | POA: Diagnosis present

## 2014-03-31 DIAGNOSIS — I120 Hypertensive chronic kidney disease with stage 5 chronic kidney disease or end stage renal disease: Secondary | ICD-10-CM | POA: Diagnosis present

## 2014-03-31 DIAGNOSIS — I2510 Atherosclerotic heart disease of native coronary artery without angina pectoris: Secondary | ICD-10-CM | POA: Diagnosis present

## 2014-03-31 DIAGNOSIS — Z87891 Personal history of nicotine dependence: Secondary | ICD-10-CM | POA: Diagnosis not present

## 2014-03-31 HISTORY — PX: APPLICATION OF WOUND VAC: SHX5189

## 2014-03-31 HISTORY — PX: I & D EXTREMITY: SHX5045

## 2014-03-31 LAB — CREATININE, SERUM
Creatinine, Ser: 3.8 mg/dL — ABNORMAL HIGH (ref 0.50–1.35)
GFR, EST AFRICAN AMERICAN: 16 mL/min — AB (ref >90)
GFR, EST NON AFRICAN AMERICAN: 14 mL/min — AB (ref >90)

## 2014-03-31 LAB — POCT I-STAT 4, (NA,K, GLUC, HGB,HCT)
GLUCOSE: 89 mg/dL (ref 70–99)
Glucose, Bld: 93 mg/dL (ref 70–99)
HCT: 37 % — ABNORMAL LOW (ref 39.0–52.0)
HCT: 39 % (ref 39.0–52.0)
Hemoglobin: 12.6 g/dL — ABNORMAL LOW (ref 13.0–17.0)
Hemoglobin: 13.3 g/dL (ref 13.0–17.0)
Potassium: 5.3 meq/L (ref 3.7–5.3)
Potassium: 4.8 mEq/L (ref 3.7–5.3)
Sodium: 135 meq/L — ABNORMAL LOW (ref 137–147)
Sodium: 138 mEq/L (ref 137–147)

## 2014-03-31 LAB — GLUCOSE, CAPILLARY
GLUCOSE-CAPILLARY: 76 mg/dL (ref 70–99)
GLUCOSE-CAPILLARY: 86 mg/dL (ref 70–99)
Glucose-Capillary: 88 mg/dL (ref 70–99)
Glucose-Capillary: 76 mg/dL (ref 70–99)
Glucose-Capillary: 80 mg/dL (ref 70–99)
Glucose-Capillary: 109 mg/dL — ABNORMAL HIGH (ref 70–99)

## 2014-03-31 LAB — CBC
HEMATOCRIT: 34.8 % — AB (ref 39.0–52.0)
HEMOGLOBIN: 10.8 g/dL — AB (ref 13.0–17.0)
MCH: 26.3 pg (ref 26.0–34.0)
MCHC: 31 g/dL (ref 30.0–36.0)
MCV: 84.7 fL (ref 78.0–100.0)
Platelets: 342 10*3/uL (ref 150–400)
RBC: 4.11 MIL/uL — AB (ref 4.22–5.81)
RDW: 17.6 % — ABNORMAL HIGH (ref 11.5–15.5)
WBC: 5.6 10*3/uL (ref 4.0–10.5)

## 2014-03-31 SURGERY — IRRIGATION AND DEBRIDEMENT EXTREMITY
Anesthesia: General | Site: Leg Upper | Laterality: Left

## 2014-03-31 MED ORDER — ONDANSETRON HCL 4 MG/2ML IJ SOLN
INTRAMUSCULAR | Status: DC | PRN
Start: 2014-03-31 — End: 2014-03-31
  Administered 2014-03-31: 4 mg via INTRAVENOUS

## 2014-03-31 MED ORDER — VANCOMYCIN HCL 10 G IV SOLR
1500.0000 mg | Freq: Once | INTRAVENOUS | Status: DC
  Filled 2014-03-31: qty 1500

## 2014-03-31 MED ORDER — FENTANYL CITRATE 0.05 MG/ML IJ SOLN
INTRAMUSCULAR | Status: AC
  Filled 2014-03-31: qty 5

## 2014-03-31 MED ORDER — HYDROMORPHONE HCL 1 MG/ML IJ SOLN
INTRAMUSCULAR | Status: AC
  Filled 2014-03-31: qty 1

## 2014-03-31 MED ORDER — ALBUTEROL SULFATE (2.5 MG/3ML) 0.083% IN NEBU: 2.5000 mg | INHALATION_SOLUTION | Freq: Four times a day (QID) | RESPIRATORY_TRACT | Status: DC | PRN

## 2014-03-31 MED ORDER — MORPHINE SULFATE 2 MG/ML IJ SOLN: 2.0000 mg | INTRAMUSCULAR | Status: DC | PRN

## 2014-03-31 MED ORDER — PROPOFOL 10 MG/ML IV BOLUS
INTRAVENOUS | Status: AC
  Filled 2014-03-31: qty 20

## 2014-03-31 MED ORDER — PHENYLEPHRINE HCL 10 MG/ML IJ SOLN
INTRAMUSCULAR | Status: DC | PRN
  Administered 2014-03-31: 40 ug via INTRAVENOUS

## 2014-03-31 MED ORDER — ONDANSETRON HCL 4 MG/2ML IJ SOLN: 4.0000 mg | Freq: Four times a day (QID) | INTRAMUSCULAR | Status: DC | PRN

## 2014-03-31 MED ORDER — POLYETHYLENE GLYCOL 3350 17 G PO PACK
17.0000 g | PACK | Freq: Every day | ORAL | Status: DC | PRN
  Filled 2014-03-31: qty 1

## 2014-03-31 MED ORDER — ARTIFICIAL TEARS OP OINT
TOPICAL_OINTMENT | OPHTHALMIC | Status: DC | PRN
  Administered 2014-03-31: 1 via OPHTHALMIC

## 2014-03-31 MED ORDER — CEFAZOLIN SODIUM-DEXTROSE 2-3 GM-% IV SOLR
INTRAVENOUS | Status: DC | PRN
  Administered 2014-03-31: 2 g via INTRAVENOUS

## 2014-03-31 MED ORDER — ENOXAPARIN SODIUM 30 MG/0.3ML ~~LOC~~ SOLN
30.0000 mg | SUBCUTANEOUS | Status: DC
  Administered 2014-04-02: 30 mg via SUBCUTANEOUS
  Filled 2014-03-31 (×3): qty 0.3

## 2014-03-31 MED ORDER — OXYCODONE HCL 5 MG PO TABS: 5.0000 mg | ORAL_TABLET | Freq: Four times a day (QID) | ORAL | Status: DC | PRN

## 2014-03-31 MED ORDER — FENTANYL CITRATE 0.05 MG/ML IJ SOLN
INTRAMUSCULAR | Status: DC | PRN
  Administered 2014-03-31 (×2): 25 ug via INTRAVENOUS
  Administered 2014-03-31 (×2): 50 ug via INTRAVENOUS

## 2014-03-31 MED ORDER — GUAIFENESIN-DM 100-10 MG/5ML PO SYRP: 15.0000 mL | ORAL_SOLUTION | ORAL | Status: DC | PRN

## 2014-03-31 MED ORDER — HYDROCODONE-ACETAMINOPHEN 5-325 MG PO TABS: 1.0000 | ORAL_TABLET | Freq: Four times a day (QID) | ORAL | Status: DC | PRN

## 2014-03-31 MED ORDER — DARBEPOETIN ALFA-POLYSORBATE 100 MCG/0.5ML IJ SOLN
100.0000 ug | INTRAMUSCULAR | Status: DC
  Administered 2014-04-01: 100 ug via INTRAVENOUS
  Filled 2014-03-31: qty 0.5

## 2014-03-31 MED ORDER — LIDOCAINE HCL (CARDIAC) 20 MG/ML IV SOLN
INTRAVENOUS | Status: DC | PRN
  Administered 2014-03-31: 60 mg via INTRAVENOUS

## 2014-03-31 MED ORDER — CEFAZOLIN SODIUM-DEXTROSE 2-3 GM-% IV SOLR
INTRAVENOUS | Status: AC
  Filled 2014-03-31: qty 50

## 2014-03-31 MED ORDER — ASPIRIN 325 MG PO TABS
325.0000 mg | ORAL_TABLET | Freq: Every evening | ORAL | Status: DC
  Administered 2014-04-01: 325 mg via ORAL
  Filled 2014-03-31 (×3): qty 1

## 2014-03-31 MED ORDER — RENA-VITE PO TABS
1.0000 | ORAL_TABLET | Freq: Every day | ORAL | Status: DC
  Administered 2014-03-31 – 2014-04-01 (×2): 1 via ORAL
  Filled 2014-03-31 (×3): qty 1

## 2014-03-31 MED ORDER — METOPROLOL TARTRATE 1 MG/ML IV SOLN: 2.0000 mg | INTRAVENOUS | Status: DC | PRN

## 2014-03-31 MED ORDER — GABAPENTIN 100 MG PO CAPS
200.0000 mg | ORAL_CAPSULE | Freq: Every day | ORAL | Status: DC
  Administered 2014-04-02: 200 mg via ORAL
  Filled 2014-03-31 (×3): qty 2

## 2014-03-31 MED ORDER — ONDANSETRON HCL 4 MG PO TABS: 4.0000 mg | ORAL_TABLET | Freq: Three times a day (TID) | ORAL | Status: DC | PRN

## 2014-03-31 MED ORDER — CHLORHEXIDINE GLUCONATE CLOTH 2 % EX PADS: 6.0000 | MEDICATED_PAD | Freq: Once | CUTANEOUS | Status: DC

## 2014-03-31 MED ORDER — ATORVASTATIN CALCIUM 20 MG PO TABS
20.0000 mg | ORAL_TABLET | Freq: Every day | ORAL | Status: DC
  Administered 2014-03-31 – 2014-04-01 (×2): 20 mg via ORAL
  Filled 2014-03-31 (×4): qty 1

## 2014-03-31 MED ORDER — PANTOPRAZOLE SODIUM 40 MG PO TBEC
40.0000 mg | DELAYED_RELEASE_TABLET | Freq: Every day | ORAL | Status: DC
  Administered 2014-04-02: 40 mg via ORAL
  Filled 2014-03-31: qty 1

## 2014-03-31 MED ORDER — PHENOL 1.4 % MT LIQD
1.0000 | OROMUCOSAL | Status: DC | PRN
  Filled 2014-03-31: qty 177

## 2014-03-31 MED ORDER — ACETAMINOPHEN 325 MG PO TABS: 650.0000 mg | ORAL_TABLET | Freq: Four times a day (QID) | ORAL | Status: DC | PRN

## 2014-03-31 MED ORDER — SODIUM CHLORIDE 0.9 % IV SOLN
INTRAVENOUS | Status: DC | PRN
  Administered 2014-03-31: 14:00:00 via INTRAVENOUS

## 2014-03-31 MED ORDER — INSULIN ASPART 100 UNIT/ML ~~LOC~~ SOLN
0.0000 [IU] | Freq: Three times a day (TID) | SUBCUTANEOUS | Status: DC
  Administered 2014-04-02: 2 [IU] via SUBCUTANEOUS

## 2014-03-31 MED ORDER — HYDROMORPHONE HCL 1 MG/ML IJ SOLN
0.2500 mg | INTRAMUSCULAR | Status: DC | PRN
  Administered 2014-03-31 (×4): 0.5 mg via INTRAVENOUS

## 2014-03-31 MED ORDER — HYDRALAZINE HCL 20 MG/ML IJ SOLN: 10.0000 mg | INTRAMUSCULAR | Status: DC | PRN

## 2014-03-31 MED ORDER — ONDANSETRON HCL 4 MG/2ML IJ SOLN
INTRAMUSCULAR | Status: AC
  Filled 2014-03-31: qty 2

## 2014-03-31 MED ORDER — INSULIN ASPART 100 UNIT/ML ~~LOC~~ SOLN: 0.0000 [IU] | Freq: Every day | SUBCUTANEOUS | Status: DC

## 2014-03-31 MED ORDER — PROPOFOL 10 MG/ML IV BOLUS
INTRAVENOUS | Status: DC | PRN
  Administered 2014-03-31: 100 mg via INTRAVENOUS
  Administered 2014-03-31: 20 mg via INTRAVENOUS

## 2014-03-31 MED ORDER — SODIUM CHLORIDE 0.9 % IV SOLN
INTRAVENOUS | Status: DC
  Administered 2014-03-31: 12:00:00 via INTRAVENOUS

## 2014-03-31 MED ORDER — 0.9 % SODIUM CHLORIDE (POUR BTL) OPTIME
TOPICAL | Status: DC | PRN
  Administered 2014-03-31: 1000 mL

## 2014-03-31 MED ORDER — DOCUSATE SODIUM 100 MG PO CAPS
100.0000 mg | ORAL_CAPSULE | Freq: Every day | ORAL | Status: DC
  Administered 2014-04-02: 100 mg via ORAL
  Filled 2014-03-31 (×2): qty 1

## 2014-03-31 MED ORDER — BOOST / RESOURCE BREEZE PO LIQD
1.0000 | Freq: Two times a day (BID) | ORAL | Status: DC
  Administered 2014-03-31 – 2014-04-02 (×4): 1 via ORAL

## 2014-03-31 SURGICAL SUPPLY — 37 items
BANDAGE ELASTIC 4 VELCRO ST LF (GAUZE/BANDAGES/DRESSINGS) IMPLANT
BANDAGE ELASTIC 6 VELCRO ST LF (GAUZE/BANDAGES/DRESSINGS) IMPLANT
BNDG GAUZE ELAST 4 BULKY (GAUZE/BANDAGES/DRESSINGS) IMPLANT
CANISTER SUCTION 2500CC (MISCELLANEOUS) ×2 IMPLANT
CANISTER WOUND CARE 500ML ATS (WOUND CARE) ×1 IMPLANT
CONT SPECI 4OZ STER CLIK (MISCELLANEOUS) ×1 IMPLANT
COVER SURGICAL LIGHT HANDLE (MISCELLANEOUS) ×2 IMPLANT
DRAPE PROXIMA HALF (DRAPES) ×1 IMPLANT
DRAPE SPLIT 77X100IN (DRAPES) ×1 IMPLANT
DRAPE U-SHAPE 76X120 STRL (DRAPES) ×1 IMPLANT
DRSG VAC ATS LRG SENSATRAC (GAUZE/BANDAGES/DRESSINGS) IMPLANT
DRSG VAC ATS MED SENSATRAC (GAUZE/BANDAGES/DRESSINGS) ×1 IMPLANT
ELECT REM PT RETURN 9FT ADLT (ELECTROSURGICAL) ×2
ELECTRODE REM PT RTRN 9FT ADLT (ELECTROSURGICAL) ×1 IMPLANT
FLUID NSS /IRRIG 3000 ML XXX (IV SOLUTION) IMPLANT
GAUZE SPONGE 4X4 12PLY STRL (GAUZE/BANDAGES/DRESSINGS) ×2 IMPLANT
GLOVE BIO SURGEON STRL SZ7.5 (GLOVE) ×4 IMPLANT
GOWN STRL REUS W/ TWL LRG LVL3 (GOWN DISPOSABLE) ×3 IMPLANT
GOWN STRL REUS W/TWL LRG LVL3 (GOWN DISPOSABLE) ×6
HANDPIECE INTERPULSE COAX TIP (DISPOSABLE)
KIT BASIN OR (CUSTOM PROCEDURE TRAY) ×2 IMPLANT
KIT ROOM TURNOVER OR (KITS) ×2 IMPLANT
NS IRRIG 1000ML POUR BTL (IV SOLUTION) ×2 IMPLANT
PACK GENERAL/GYN (CUSTOM PROCEDURE TRAY) ×2 IMPLANT
PACK UNIVERSAL I (CUSTOM PROCEDURE TRAY) IMPLANT
PAD ARMBOARD 7.5X6 YLW CONV (MISCELLANEOUS) ×4 IMPLANT
PAD NEG PRESSURE SENSATRAC (MISCELLANEOUS) ×2 IMPLANT
SET HNDPC FAN SPRY TIP SCT (DISPOSABLE) IMPLANT
STAPLER VISISTAT 35W (STAPLE) IMPLANT
SUT ETHILON 3 0 PS 1 (SUTURE) IMPLANT
SUT VIC AB 2-0 CTX 36 (SUTURE) IMPLANT
SUT VIC AB 3-0 SH 27 (SUTURE)
SUT VIC AB 3-0 SH 27X BRD (SUTURE) IMPLANT
SUT VICRYL 4-0 PS2 18IN ABS (SUTURE) IMPLANT
TOWEL OR 17X24 6PK STRL BLUE (TOWEL DISPOSABLE) ×2 IMPLANT
TOWEL OR 17X26 10 PK STRL BLUE (TOWEL DISPOSABLE) ×2 IMPLANT
WATER STERILE IRR 1000ML POUR (IV SOLUTION) ×2 IMPLANT

## 2014-03-31 NOTE — Progress Notes (Signed)
Received TC from from Admissions Coordinator at [facility] re: pt. Facility will be able to accept pt back with wound vac on ? 04/01/14.  Unit CSW to facilitate tx.

## 2014-03-31 NOTE — Anesthesia Preprocedure Evaluation (Signed)
Anesthesia Evaluation  Patient identified by MRN, date of birth, ID band Patient awake    Reviewed: Allergy & Precautions, H&P , NPO status , Patient's Chart, lab work & pertinent test results  Airway Mallampati: II  Neck ROM: full    Dental  (+) Edentulous Upper, Edentulous Lower   Pulmonary former smoker,    Pulmonary exam normal       Cardiovascular hypertension, + CAD, + Past MI and + Peripheral Vascular Disease  Cleared by his internist.   Last nuclear test done on 7/5 showed no ischemia but an ejection fraction of 25%. -His chest pain-free   Neuro/Psych  Headaches, PSYCHIATRIC DISORDERS Anxiety Depression  Neuromuscular disease CVA    GI/Hepatic GERD-  ,  Endo/Other  diabetes, Type 2  Renal/GU ESRF and DialysisRenal disease     Musculoskeletal   Abdominal   Peds  Hematology   Anesthesia Other Findings   Reproductive/Obstetrics                           Anesthesia Physical  Anesthesia Plan  ASA: III  Anesthesia Plan: General   Post-op Pain Management:    Induction: Intravenous  Airway Management Planned: LMA  Additional Equipment:   Intra-op Plan:   Post-operative Plan: Extubation in OR  Informed Consent: I have reviewed the patients History and Physical, chart, labs and discussed the procedure including the risks, benefits and alternatives for the proposed anesthesia with the patient or authorized representative who has indicated his/her understanding and acceptance.   Dental advisory given  Plan Discussed with: CRNA, Anesthesiologist and Surgeon  Anesthesia Plan Comments:         Anesthesia Quick Evaluation

## 2014-03-31 NOTE — H&P (View-Only) (Signed)
Patient is a 78 year old male who is status post left above-knee amputation on August 24. He returns today for followup. He has some dementia. 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 He is currently residing in a skilled nursing facility, [facility].  His dialysis days Monday Wednesday Friday. He has had increasing drainage from the left above-knee amputation. He is completely nonambulatory and several person assist. He is currently receiving Santyl on the wound.  Past Medical History  Diagnosis Date  . Hyperparathyroidism   . Arthritis   . Hyperlipidemia   . Hypertension   . Neuromuscular disorder   . Stroke   . GERD (gastroesophageal reflux disease)   . Migraine   . Heart attack 2011; 1990's  . Type II diabetes mellitus   . Peripheral neuropathy   . Anxiety   . Depression   . Dialysis patient     M, W, F; [place] ESRD (end stage renal disease) on dialysis   . CAD (coronary artery disease)     a. s/p NSTEMI and failed RCA PCI in 2011. b. Nuc 12/2013: nonischemic, prior inferior MI.  Marland Kitchen LBBB (left bundle branch block)   . Calciphylaxis     a. Multiple admissions for nonhealing L ulcer/sepsis, s/p L AKA 02/02/2014.  Marland Kitchen Chronic anemia   . Dementia     Past Surgical History  Procedure Laterality Date  . Colon surgery  2011    colon resection   . Parathyroidectomy  2011  . Capd insertion  09/12/2011    Procedure: LAPAROSCOPIC INSERTION CONTINUOUS AMBULATORY PERITONEAL DIALYSIS  (CAPD) CATHETER;  Surgeon: Edward Jolly, MD;  Location: [place];  Service: General;  Laterality: N/A;  . Sp av dialysis shunt access existing *l*  2003  . Dg av dialysis shunt intro ndl*r* or      "not working" (09/19/11)  . Capd removal  03/12/2012    Procedure: CONTINUOUS AMBULATORY PERITONEAL DIALYSIS  (CAPD) CATHETER REMOVAL;  Surgeon: Edward Jolly, MD;  Location: [place];  Service: General;  Laterality: N/A;  . Amputation Left 02/02/2014    Procedure: AMPUTATION ABOVE KNEE-LEFT;   Surgeon: Elam Dutch, MD;  Location: [facility] OR;  Service: Vascular;  Laterality: Left;    Current Outpatient Prescriptions on File Prior to Visit  Medication Sig Dispense Refill  . acetaminophen (TYLENOL) 325 MG tablet Take 2 tablets (650 mg total) by mouth every 6 (six) hours as needed for mild pain, fever or headache.      . albuterol (PROVENTIL) (2.5 MG/3ML) 0.083% nebulizer solution Take 3 mLs (2.5 mg total) by nebulization every 6 (six) hours as needed for wheezing or shortness of breath.  75 mL  12  . aspirin 325 MG tablet Take 325 mg by mouth every evening.      Marland Kitchen atorvastatin (LIPITOR) 20 MG tablet Take 20 mg by mouth at bedtime.      Marland Kitchen b complex-vitamin c-folic acid (NEPHRO-VITE) 0.8 MG TABS Take 0.8 mg by mouth at bedtime.      . ciprofloxacin (CIPRO) 500 MG tablet Take 1 tablet (500 mg total) by mouth daily with breakfast. For 3days      . feeding supplement, RESOURCE BREEZE, (RESOURCE BREEZE) LIQD Take 1 Container by mouth 2 (two) times daily between meals.    0  . Gabapentin (NEURONTIN PO) Take by mouth daily.      Marland Kitchen HYDROcodone-acetaminophen (NORCO/VICODIN) 5-325 MG per tablet Take 1 tablet by mouth every 6 (six) hours as needed for moderate  pain or severe pain.  120 tablet  0  . multivitamin (RENA-VIT) TABS tablet Take 1 tablet by mouth at bedtime.    0  . ondansetron (ZOFRAN) 4 MG tablet Take 4 mg by mouth every 8 (eight) hours as needed for nausea or vomiting.      Marland Kitchen doxycycline (VIBRAMYCIN) 100 MG capsule Take 100 mg by mouth 2 (two) times daily.      . metroNIDAZOLE (FLAGYL) 500 MG tablet Take 1 tablet (500 mg total) by mouth every 8 (eight) hours. For 3days    0   No current facility-administered medications on file prior to visit.    The left above-knee amputation still has some mild edema. There is dry dark eschar along the entire incision line however there does appear to be some granulation tissue below this.  Assessment: Slowly Healing left above-knee amputation with  some skin breakdown  Plan: We'll plan to debride his above-knee amputation in the operating room next Tuesday, October 20. I discussed this plan with the son today.   Ruta Hinds, MD [facility] [place] Office: [phone] Pager: [phone]

## 2014-03-31 NOTE — Anesthesia Postprocedure Evaluation (Signed)
  Anesthesia Post-op Note  Patient: Sean [name]  Procedure(s) Performed: Procedure(s): IRRIGATION AND DEBRIDEMENT EXTREMITY - LEFT ABOVE KNEE AMPUTATION (Left) APPLICATION OF WOUND VAC - LEFT AKA (Left)  Patient Location: PACU  Anesthesia Type:General  Level of Consciousness: awake  Airway and Oxygen Therapy: Patient Spontanous Breathing  Post-op Pain: mild  Post-op Assessment: Post-op Vital signs reviewed, Patient's Cardiovascular Status Stable, Respiratory Function Stable, Patent Airway, No signs of Nausea or vomiting and Pain level controlled  Post-op Vital Signs: Reviewed and stable  Last Vitals:  Filed Vitals:   03/31/14 1606  BP: 103/60  Pulse: 103  Temp: 36.8 C  Resp: 18    Complications: No apparent anesthesia complications

## 2014-03-31 NOTE — Consult Note (Signed)
[facility] Renal Consultation Note    Indication for Consultation:  Management of ESRD/hemodialysis; anemia, hypertension/volume and secondary hyperparathyroidism PCP:  HPI: Sean [name] is a 78 y.o. male with ESRD secondary to DM on HD since February 2003 who dialyzes MWF at the [facility].  He had a left AKA in August for nonhealing calciphylaxis wounds and has been to several different nursing homes over the past few months. He currently resides at [facility].  I was called by the staff 10/16 for + MRSA wound cultures (AKA) and he has been receiving Vancomycin at dialysis since that time ( got loading dose last week and 750 mg yesterday).  He was also evaluated by VVS in the office and he subsequently received debridement of his wound today with plans for a VAC once insurance approval is received.  He has not had any fevers at his outpt HD unit. He is due for dialysis tomorrow. He c/o hunger but otherwise no real concerns.  No SOB, N, V, D, fever or chills.  Daughter is with him today and trying to locate his teeth which apparently he was wearing when he left his NH this am.  Chart review: 10/08 - ESRD, fever, left leg calciphylaxis, DM, abd pain , anxiety, HL > fever w/u, possible big toe infection left foot. CT did not show abcess, MRI planned but pt too claustrophobia.  Fevers resolved, BCx's neg, UA /cx neg. Dc'd on vanc and Fortaz at HD.  8/09 - eval of chronic L leg wounds by Dr Jeanmarie Hubert   2/10 - 2 HPTH > PTX with removal 3 large glands, R thyroid lobectomy, probable R inf parathyroid gland remaining. 4th gland not clearly found and plan was for rechecking PTH a month after PTX and re-explore if PTH remains high 1/11 - NSTEMI, unsuccessful PCI/stent attempt to RCA, medical rx.  Clotted AVF L arm. 4/13 - insertion of PD cath  4/13 - slurred speech, aphasia x 24 hours > CT showed acute L parietal stroke 7/4- 12/16/13 > fatigue, malaise, chest pain > poor appetite d/t severe  leg pain and missed HD; severe calciphylaxis rx w vocodin prn, chest pain ruled out and had neg stress test.  EF dropped though by echo and LHC suggested but pt declined.  7/11- 12/24/13 > worsening L leg pain > PTH from December 15, 2013 up at 802. May need further investigation. ESRD on HD. ISM, EF 25%.  Coreg, bidil, pt refused heart cath.  8/8- 01/21/14 >  Weak and confused > admitted , had missed HD x 1. Empiric IV abx for poss infected L leg wounds, wound care consulted. Wound cx's grew MSSA and Citrobacter, changed to Levaquin.  8/18- 02/09/14 > septic shock, in ICU x 9 days, on pressors for a few days > acute cholecystitis had perc chole drain on 8/27, plan for OP f/u and delayed surgery. DC'd on po cipro and flagyl. L leg wounds from calciphylaxis worsened and pt had L AKA on 8/24 by Dr Oneida Alar. Cleora Fleet d/t demand ischemia and ESRD on HD. Hx prior CVA and underlying dementia. CAD EF 25 %.   Past Medical History  Diagnosis Date  . Hyperparathyroidism   . Arthritis   . Hyperlipidemia   . Hypertension   . Neuromuscular disorder   . Stroke   . GERD (gastroesophageal reflux disease)   . Migraine   . Heart attack 2011; 1990's  . Peripheral neuropathy   . Anxiety   . Depression   . Dialysis  patient     M, W, F; [place] CAD (coronary artery disease)     a. s/p NSTEMI and failed RCA PCI in 2011. b. Nuc 12/2013: nonischemic, prior inferior MI.  Marland Kitchen LBBB (left bundle branch block)   . Calciphylaxis     a. Multiple admissions for nonhealing L ulcer/sepsis, s/p L AKA 02/02/2014.  Marland Kitchen Chronic anemia   . Dementia   . Type II diabetes mellitus     not taking any medications.  . ESRD (end stage renal disease) on dialysis    Past Surgical History  Procedure Laterality Date  . Colon surgery  2011    colon resection   . Parathyroidectomy  2011  . Capd insertion  09/12/2011    Procedure: LAPAROSCOPIC INSERTION CONTINUOUS AMBULATORY PERITONEAL DIALYSIS  (CAPD) CATHETER;  Surgeon: Edward Jolly, MD;  Location: [place];  Service: General;  Laterality: N/A;  . Sp av dialysis shunt access existing *l*  2003  . Dg av dialysis shunt intro ndl*r* or      "not working" (09/19/11)  . Capd removal  03/12/2012    Procedure: CONTINUOUS AMBULATORY PERITONEAL DIALYSIS  (CAPD) CATHETER REMOVAL;  Surgeon: Edward Jolly, MD;  Location: [place];  Service: General;  Laterality: N/A;  . Amputation Left 02/02/2014    Procedure: AMPUTATION ABOVE KNEE-LEFT;  Surgeon: Elam Dutch, MD;  Location: [facility] OR;  Service: Vascular;  Laterality: Left;   Family History  Problem Relation Age of Onset  . Cancer Sister     breast  . Cancer Brother     lung  . Anesthesia problems Neg Hx   . Hypotension Neg Hx   . Malignant hyperthermia Neg Hx   . Pseudochol deficiency Neg Hx    Social History:  reports that he quit smoking about 37 years ago. His smoking use included Cigarettes. He has a 1.75 pack-year smoking history. He has never used smokeless tobacco. He reports that he does not drink alcohol or use illicit drugs. Allergies  Allergen Reactions  . Benazepril Hcl Hives, Itching and Rash    flareups on patient head   Prior to Admission medications   Medication Sig Start Date End Date Taking? Authorizing Provider  aspirin 325 MG tablet Take 325 mg by mouth every evening.   Yes Historical Provider, MD  atorvastatin (LIPITOR) 20 MG tablet Take 20 mg by mouth at bedtime.   Yes Historical Provider, MD  b complex-vitamin c-folic acid (NEPHRO-VITE) 0.8 MG TABS Take 0.8 mg by mouth at bedtime.   Yes Historical Provider, MD  feeding supplement, RESOURCE BREEZE, (RESOURCE BREEZE) LIQD Take 1 Container by mouth 2 (two) times daily between meals. 01/21/14  Yes Ripudeep Krystal Eaton, MD  gabapentin (NEURONTIN) 100 MG capsule Take 200 mg by mouth daily.   Yes Historical Provider, MD  ondansetron (ZOFRAN) 4 MG tablet Take 4 mg by mouth every 8 (eight) hours as needed for nausea or vomiting.   Yes Historical Provider, MD   polyethylene glycol (MIRALAX / GLYCOLAX) packet Take 17 g by mouth daily as needed for moderate constipation.   Yes Historical Provider, MD  vancomycin 1,500 mg in sodium chloride 0.9 % 500 mL Inject 1,500 mg into the vein once.   Yes Historical Provider, MD  acetaminophen (TYLENOL) 325 MG tablet Take 2 tablets (650 mg total) by mouth every 6 (six) hours as needed for mild pain, fever or headache. 12/24/13   Debbe Odea, MD  albuterol (PROVENTIL) (2.5 MG/3ML) 0.083%  nebulizer solution Take 3 mLs (2.5 mg total) by nebulization every 6 (six) hours as needed for wheezing or shortness of breath. 01/21/14   Ripudeep Krystal Eaton, MD  HYDROcodone-acetaminophen (NORCO/VICODIN) 5-325 MG per tablet Take 1 tablet by mouth every 6 (six) hours as needed for moderate pain or severe pain. 03/31/14   Gabriel Earing, PA-C   Current Facility-Administered Medications  Medication Dose Route Frequency Provider Last Rate Last Dose  . 0.9 %  sodium chloride infusion   Intravenous Continuous Elam Dutch, MD 10 mL/hr at 03/31/14 1130    . cefUROXime (ZINACEF) 1.5 g in dextrose 5 % 50 mL IVPB  1.5 g Intravenous 30 min Pre-Op Elam Dutch, MD      . Chlorhexidine Gluconate Cloth 2 % PADS 6 each  6 each Topical Once Elam Dutch, MD      . HYDROmorphone (DILAUDID) 1 MG/ML injection           . HYDROmorphone (DILAUDID) 1 MG/ML injection           . HYDROmorphone (DILAUDID) injection 0.25-0.5 mg  0.25-0.5 mg Intravenous Q5 min PRN Arabella Merles, MD   0.5 mg at 03/31/14 1515   Labs: Basic Metabolic Panel:  Recent Labs Lab 03/31/14 1127 03/31/14 1131  NA 135* 138  K 5.3 4.8  GLUCOSE 93 89  CBC:  Recent Labs Lab 03/31/14 1127 03/31/14 1131  HGB 12.6* 13.3  HCT 37.0* 39.0  CBG:  Recent Labs Lab 03/31/14 1052 03/31/14 1252 03/31/14 1331 03/31/14 1448  GLUCAP 76 88 76 80   IStudies/Results: Dg Chest 2 View  03/31/2014   CLINICAL DATA:  Preop for nonhealing sores of leg.  EXAM: CHEST  2  VIEW  COMPARISON:  None.  FINDINGS: Stable cardiomediastinal silhouette. Both lungs are clear. No pneumothorax or pleural effusion is noted. The visualized skeletal structures are unremarkable.  IMPRESSION: No acute cardiopulmonary abnormality seen.   Electronically Signed   By: Sabino Dick M.D.   On: 03/31/2014 12:17    ROS: As per HPI otherwise negative.  Physical Exam:  Filed Vitals:   03/31/14 1500 03/31/14 1510 03/31/14 1515 03/31/14 1530  BP:      Pulse: 83 93 97 89  Temp:      TempSrc:      Resp: 20 18 16 18   Height:      Weight:      SpO2: 95% 96% 98% 100%     General: Elderly AAM, in no acute distress Head: Normocephalic, atraumatic, sclera non-icteric, mucus membranes are moist Neck: Supple. JVD not elevated. Lungs: Clear bilaterally to auscultation without wheezes, rales, or rhonchi. Breathing is unlabored. Heart: RRR with S1 S2. No murmurs, rubs, or gallops appreciated. Abdomen: Soft, NTND - cystotomy tube removed last Friday by IR  right sided wound healing well- scant drainage on dressing. Lower extremities: left AKA VAC in place no edema; right LE no edema; heel peeling thick skin Neuro: Alert - not oriented to date, recognizes me by name  Psych:  Responds to questions appropriately with a normal affect. Dialysis Access:left upper AVGG + bruit  Dialysis Orders:  [facility] on MWF .  EDW 73kg HD Bath 4.0k, 2.25ca Time 4.o hr Heparin none  Access LUA AVG BFR 450 DFR 1.5  0 Vit D HD Arasnep 111mcg qwed Units IV/HD Venofer 100 mg q weekly hd - last ARanesp 10/14 - venofer 10/9 no HECTOROL OP Lab ipth 647 12/31/13  Hgb 9.8 10/14   Assessment/Plan: 1.  S/P debridement of left AKA - cultures growing MRSA 10/26 at outpt NH - had been receiving IV Vanc at his HD center since then - due due to computer glitches, it is not reflected on treatment meds - I confirmed with RN today; plan cancel 1.5 gm Vanc ordered today and repeat Vanc level with HD tomorrow and then pharmacy to  redose 2.  ESRD -  MWF - HD in the am K 4.8 istat - has been on 4 K bath at outpt HD - start on 2 K bath on Wed and recheck 3.  Hypotension/volume  - BP tend to run 80s - 100 at outpt HD - low gains ~ 0.5 kg; CXR neg today 4.  Anemia  - continue ESA - suspect istat Hgb inflated - repeat in am pre HD 5.  Metabolic bone disease -  No hectorol due to history of calciphlaxis 6.  Nutrition - poor - advance diet as able;  needs more aggressive supplmentation when he returns to NH - maybe liberalization of diet; dentures missing at present - trying to locate - wore to the hospital this am.  [name], PA-C [place] [phone] 03/31/2014, 3:35 PM   Pt seen, examined and agree w A/P as above. ESRD patient with multiple medical problems and FTT. Lives in SNF now.  Had septic shock w acute cholecystitis In August  recently rx'd with perc drain which was removed last week by IR.  Had L AKA during that admit for non-healing calciphylaxis wounds of left leg.  DC'd to SNF. Now has some drainage from L AKA wound, OP cx's + for MSSA from SNF. He was admitted for I&D of L AKA wound and VAC placement which was done earlier today. Plan HD tomorrow. Will follow.  [name] MD pager [phone]    cell [phone] 03/31/2014, 5:14 PM

## 2014-03-31 NOTE — Transfer of Care (Signed)
Immediate Anesthesia Transfer of Care Note  Patient: Sean [name]  Procedure(s) Performed: Procedure(s): IRRIGATION AND DEBRIDEMENT EXTREMITY - LEFT ABOVE KNEE AMPUTATION (Left) APPLICATION OF WOUND VAC - LEFT AKA (Left)  Patient Location: PACU  Anesthesia Type:General  Level of Consciousness: awake and alert   Airway & Oxygen Therapy: Patient Spontanous Breathing and Patient connected to nasal cannula oxygen  Post-op Assessment: Report given to PACU RN, Post -op Vital signs reviewed and stable and Patient moving all extremities  Post vital signs: Reviewed and stable  Complications: No apparent anesthesia complications

## 2014-03-31 NOTE — Interval H&P Note (Signed)
History and Physical Interval Note:  03/31/2014 1:21 PM  [name]  has presented today for surgery, with the diagnosis of Nonhealing Surgical Wound Left Above Knee Amputation  The various methods of treatment have been discussed with the patient and family. After consideration of risks, benefits and other options for treatment, the patient has consented to  Procedure(s): IRRIGATION AND DEBRIDEMENT EXTREMITY - LEFT ABOVE KNEE AMPUTATION (Left) APPLICATION OF WOUND VAC - LEFT AKA (Left) as a surgical intervention .  The patient's history has been reviewed, patient examined, no change in status, stable for surgery.  I have reviewed the patient's chart and labs.  Questions were answered to the patient's satisfaction.     Sean [name]

## 2014-03-31 NOTE — Op Note (Signed)
Procedure: Debridement of left above-knee amputation  Preoperative diagnosis: Poorly healing left below-knee amputation rule out calciphylaxis  Postoperative diagnosis: Same  Anesthesia: Gen.  Specimens: Tissue samples from left above-knee dictation wound center rule out calciphylaxis  Operative details: After obtaining informed consent, the patient was taken operating room. The patient was placed in position operating table. After induction of general anesthesia and endotracheal intubation, the patient's entire left lower extremity was prepped and draped in usual sterile fashion. A scalpel was used to debride the skin edges of the existing above-knee amputation back to healthy bleeding tissue. The necrotic debris was overall fairly superficial. Several representative samples of the poorly healing wound were sent to pathology as a specimen to rule out calciphylaxis. After a nice clean tissue plane had been obtained, the wound was thoroughly irrigated with normal saline solution. A VAC dressing was then applied and placed in 125 mm mercury continuous suction. The patient tolerated procedure well and there were no complications. The instrument sponge and needle count was correct at the end of the case. The patient was taken to recovery in stable condition.  Ruta Hinds, MD [facility] Office: [phone] Pager: [phone]

## 2014-03-31 NOTE — Progress Notes (Signed)
Pt received into room 2w06, pt placed on tele, wound vac in place, pt oriented to room and call bell, family at bedside Rickard Rhymes, RN

## 2014-04-01 ENCOUNTER — Encounter (HOSPITAL_COMMUNITY): Payer: Self-pay | Admitting: Vascular Surgery

## 2014-04-01 LAB — BASIC METABOLIC PANEL
Anion gap: 14 (ref 5–15)
BUN: 15 mg/dL (ref 6–23)
CHLORIDE: 99 meq/L (ref 96–112)
CO2: 25 mEq/L (ref 19–32)
CREATININE: 4.32 mg/dL — AB (ref 0.50–1.35)
Calcium: 8.9 mg/dL (ref 8.4–10.5)
GFR calc non Af Amer: 12 mL/min — ABNORMAL LOW (ref >90)
GFR, EST AFRICAN AMERICAN: 14 mL/min — AB (ref >90)
GLUCOSE: 77 mg/dL (ref 70–99)
Potassium: 5.2 mEq/L (ref 3.7–5.3)
Sodium: 138 mEq/L (ref 137–147)

## 2014-04-01 LAB — CBC
HCT: 31.4 % — ABNORMAL LOW (ref 39.0–52.0)
Hemoglobin: 9.5 g/dL — ABNORMAL LOW (ref 13.0–17.0)
MCH: 25.9 pg — AB (ref 26.0–34.0)
MCHC: 30.3 g/dL (ref 30.0–36.0)
MCV: 85.6 fL (ref 78.0–100.0)
Platelets: 350 10*3/uL (ref 150–400)
RBC: 3.67 MIL/uL — ABNORMAL LOW (ref 4.22–5.81)
RDW: 17.5 % — ABNORMAL HIGH (ref 11.5–15.5)
WBC: 6.1 10*3/uL (ref 4.0–10.5)

## 2014-04-01 LAB — VANCOMYCIN, RANDOM: Vancomycin Rm: 7.9 ug/mL

## 2014-04-01 LAB — GLUCOSE, CAPILLARY
GLUCOSE-CAPILLARY: 92 mg/dL (ref 70–99)
GLUCOSE-CAPILLARY: 95 mg/dL (ref 70–99)
Glucose-Capillary: 117 mg/dL — ABNORMAL HIGH (ref 70–99)
Glucose-Capillary: 118 mg/dL — ABNORMAL HIGH (ref 70–99)

## 2014-04-01 MED ORDER — VANCOMYCIN HCL IN DEXTROSE 1-5 GM/200ML-% IV SOLN
1000.0000 mg | Freq: Once | INTRAVENOUS | Status: AC
  Administered 2014-04-01: 1000 mg via INTRAVENOUS
  Filled 2014-04-01: qty 200

## 2014-04-01 MED ORDER — DARBEPOETIN ALFA-POLYSORBATE 100 MCG/0.5ML IJ SOLN
INTRAMUSCULAR | Status: AC
  Filled 2014-04-01: qty 0.5

## 2014-04-01 NOTE — Progress Notes (Signed)
Patient sent to Dialysis this morning. See alert and oriented.

## 2014-04-01 NOTE — Procedures (Signed)
I was present at this dialysis session, have reviewed the session itself and made  appropriate changes  [name] MD (pgr) [phone]    [phone] 04/01/2014, 12:35 PM

## 2014-04-01 NOTE — Progress Notes (Signed)
[facility]  Subjective  - no real pain   Objective 94/38 98 98.7 F (37.1 C) (Oral) 15 100%  Intake/Output Summary (Last 24 hours) at 04/01/14 0917 Last data filed at 04/01/14 0500  Gross per 24 hour  Intake    680 ml  Output      0 ml  Net    680 ml   VAC in place left AKA good seal  Assessment/Planning: Follow up path from debridement To SNF when [facility] available  FIELDS,CHARLES E 04/01/2014 9:17 AM --  Laboratory Lab Results:  Recent Labs  03/31/14 1840 04/01/14 0455  WBC 5.6 6.1  HGB 10.8* 9.5*  HCT 34.8* 31.4*  PLT 342 350   BMET  Recent Labs  03/31/14 1131 03/31/14 1840 04/01/14 0455  NA 138  --  138  K 4.8  --  5.2  CL  --   --  99  CO2  --   --  25  GLUCOSE 89  --  77  BUN  --   --  15  CREATININE  --  3.80* 4.32*  CALCIUM  --   --  8.9    COAG Lab Results  Component Value Date   INR 1.86* 02/04/2014   INR 1.87* 02/04/2014   INR 1.37 01/28/2014   No results found for this basename: PTT

## 2014-04-01 NOTE — Progress Notes (Signed)
UR complete.  Petrice Beedy RN, MSN 

## 2014-04-01 NOTE — Progress Notes (Signed)
OT Cancellation Note  Patient Details Name: Sean [name] MRN: [id number] DOB: 06-11-1936   Cancelled Treatment:    Reason Eval/Treat Not Completed: Other (comment) Pt is Medicare from SNF ([facility]) and current D/C plan is to return to SNF. No apparent immediate acute care OT needs, therefore will defer OT to SNF. If OT eval is needed please call Acute Rehab Dept. at [phone] or text page OT at [phone].    Villa Herb M  Cyndie Chime, OTR/L Occupational Therapist [phone] (pager)  04/01/2014, 1:34 PM

## 2014-04-01 NOTE — Progress Notes (Signed)
PT Cancellation Note  Patient Details Name: Sean [name] MRN: [id number] DOB: 1935-07-01   Cancelled Treatment:    Reason Eval/Treat Not Completed: Patient declined.  Followed up with patient this afternoon for PT evaluation however he declines at this time due to fatigue and wanting to "just rest." Explained to patient the importance of being out of bed to prevent secondary complications however he states he will consider working with PT tomorrow. Will attempt evaluation tomorrow as patient is able.  [place], [place]    Ellouise Newer 04/01/2014, 4:17 PM

## 2014-04-01 NOTE — Progress Notes (Signed)
Occupational Therapy Note  Pt currently in HD, unable to see.  Will reattempt this pm as schedule allows, or tomorrow.    Lucille Passy, OTR/L [phone]

## 2014-04-01 NOTE — Progress Notes (Signed)
Patient came back from dialysis stable. Graft site CDI.

## 2014-04-01 NOTE — Progress Notes (Signed)
ANTIBIOTIC CONSULT NOTE - INITIAL  Pharmacy Consult for vancomycin Indication: non-healing wound infection  Allergies  Allergen Reactions  . Benazepril Hcl Hives, Itching and Rash    flareups on patient head    Patient Measurements: Height: 5\' 5"  (165.1 cm) Weight: 157 lb 10.1 oz (71.5 kg) IBW/kg (Calculated) : 61.5 Adjusted Body Weight:   Vital Signs: Temp: 98.2 F (36.8 C) (10/21 1449) Temp Source: Oral (10/21 1449) BP: 110/47 mmHg (10/21 1449) Pulse Rate: 72 (10/21 1449) Intake/Output from previous day: 10/20 0701 - 10/21 0700 In: 680 [P.O.:480; I.V.:200] Out: 0  Intake/Output from this shift: Total I/O In: 120 [P.O.:120] Out: -   Labs:  Recent Labs  03/31/14 1131 03/31/14 1840 04/01/14 0455  WBC  --  5.6 6.1  HGB 13.3 10.8* 9.5*  PLT  --  342 350  CREATININE  --  3.80* 4.32*   Estimated Creatinine Clearance: 12.7 ml/min (by C-G formula based on Cr of 4.32).  Recent Labs  04/01/14 0827  VANCORANDOM 7.9     Microbiology: No results found for this or any previous visit (from the past 720 hour(s)).  Medical History: Past Medical History  Diagnosis Date  . Hyperparathyroidism   . Arthritis   . Hyperlipidemia   . Hypertension   . Neuromuscular disorder   . Stroke   . GERD (gastroesophageal reflux disease)   . Migraine   . Heart attack 2011; 1990's  . Peripheral neuropathy   . Anxiety   . Depression   . Dialysis patient     M, W, F; [place] CAD (coronary artery disease)     a. s/p NSTEMI and failed RCA PCI in 2011. b. Nuc 12/2013: nonischemic, prior inferior MI.  Marland Kitchen LBBB (left bundle branch block)   . Calciphylaxis     a. Multiple admissions for nonhealing L ulcer/sepsis, s/p L AKA 02/02/2014.  Marland Kitchen Chronic anemia   . Dementia   . Type II diabetes mellitus     not taking any medications.  . ESRD (end stage renal disease) on dialysis     Medications:  Prescriptions prior to admission  Medication Sig Dispense Refill  . aspirin  325 MG tablet Take 325 mg by mouth every evening.      Marland Kitchen atorvastatin (LIPITOR) 20 MG tablet Take 20 mg by mouth at bedtime.      Marland Kitchen b complex-vitamin c-folic acid (NEPHRO-VITE) 0.8 MG TABS Take 0.8 mg by mouth at bedtime.      . feeding supplement, RESOURCE BREEZE, (RESOURCE BREEZE) LIQD Take 1 Container by mouth 2 (two) times daily between meals.    0  . gabapentin (NEURONTIN) 100 MG capsule Take 200 mg by mouth daily.      . ondansetron (ZOFRAN) 4 MG tablet Take 4 mg by mouth every 8 (eight) hours as needed for nausea or vomiting.      . polyethylene glycol (MIRALAX / GLYCOLAX) packet Take 17 g by mouth daily as needed for moderate constipation.      . vancomycin 1,500 mg in sodium chloride 0.9 % 500 mL Inject 1,500 mg into the vein once.      . [DISCONTINUED] HYDROcodone-acetaminophen (NORCO/VICODIN) 5-325 MG per tablet Take 1 tablet by mouth every 6 (six) hours as needed for moderate pain or severe pain.  120 tablet  0  . acetaminophen (TYLENOL) 325 MG tablet Take 2 tablets (650 mg total) by mouth every 6 (six) hours as needed for mild pain, fever or headache.      Marland Kitchen  albuterol (PROVENTIL) (2.5 MG/3ML) 0.083% nebulizer solution Take 3 mLs (2.5 mg total) by nebulization every 6 (six) hours as needed for wheezing or shortness of breath.  75 mL  12   Assessment: 78 yo male with AKA in August 2015.  Pt is ESRD on HD MWF admitted for I&D of AkA. Pt has been receiving vancomycin at outside facility for MRSA wound infection.  Pre-dialysis vancomycin level returned subtheraputic. Based on calculations, will re-dose 1 gram to achieve therapeutic level.  Goal of Therapy:  Pre-HD level 15-25 mcg/ml  Plan:  -Vancomycin 1 g IV x1 -F/u HD schedule, reorder vancomycin as indicated   Hughes Better, PharmD, BCPS Clinical Pharmacist Pager: [phone] 04/01/2014 2:52 PM

## 2014-04-01 NOTE — Progress Notes (Signed)
Physical Therapy Note   Patient currently off unit for HD. Will follow up for PT evaluation at a later time.  [address] [place], [place]

## 2014-04-01 NOTE — Care Management Note (Signed)
    Page 1 of 1   04/02/2014     1:53:04 PM CARE MANAGEMENT NOTE 04/02/2014  Patient:  Sean [name], Sean [name]   Account Number:  [id number]  Date Initiated:  04/01/2014  Documentation initiated by:  Lorne Skeens  Subjective/Objective Assessment:   Patient was admitted with a non-healing wound. I&D performed this admission.  Patient was admitted from [facility].     Action/Plan:   Will follow for discharge needs.   Anticipated DC Date:  04/02/2014   Anticipated DC Plan:  SKILLED NURSING FACILITY  In-house referral  Clinical Social Worker         Choice offered to / List presented to:             Status of service:  Completed, signed off Medicare Important Message given?  NA - LOS <3 / Initial given by admissions (If response is "NO", the following Medicare IM given date fields will be blank) Date Medicare IM given:   Medicare IM given by:   Date Additional Medicare IM given:   Additional Medicare IM given by:    Discharge Disposition:  [place]  Per UR Regulation:  Reviewed for med. necessity/level of care/duration of stay  If discussed at [place] of Stay Meetings, dates discussed:    Comments:  04/02/14 Ellan Lambert, RN, BSN [phone] Pt for return to SNF today, per CSW arrangments.  04/01/14 Ellan Lambert, RN, BSN [phone] CSW consulted to facilitate return to [facility] as prior to admission when medically stable, likely 04/02/14.  CSW also made aware that pt will need wound VAC and he is to check with facility to make sure they can accommodate equipment needs.

## 2014-04-01 NOTE — Progress Notes (Signed)
[facility] Progress Note  Assessment/Plan: 1. S/P debridement of left AKA - wound cultures growing MRSA 10/26 at outpt NH, on IV Vanc at HD since 10/16. Now vanc per pharm 2. ESRD - MWF - HD K 5.2 - need to change outpt bath to 2 K 2 Ca 3. Hypotension/volume - BP tend to run 80s - 100 at outpt HD - low gains ~ 0.5 kg; CXR clear 4. Anemia - continue ESA - Hgb 9.5 , cont aranesp 5. Metabolic bone disease - No hectorol due to history of calciphylaxis 6. Nutrition - poor - advance diet as able; needs more aggressive supplmentation when he returns to NH 7. Disp - anticipate return today to [place], [place] [phone] 04/01/2014,8:58 AM  LOS: 1 day   Pt seen, examined, agree w assess/plan as above with additions as indicated.  [name] MD pager [phone]    cell [phone] 04/01/2014, 12:35 PM     Subjective:   Hasn't gotten much to eat; found teeth  Objective Filed Vitals:   03/31/14 2126 04/01/14 0457 04/01/14 0800 04/01/14 0830  BP: 110/69 109/62 108/86 94/38  Pulse: 107 97 97 98  Temp: 98.8 F (37.1 C) 98.6 F (37 C) 98.7 F (37.1 C)   TempSrc:  Oral Oral   Resp: 18 18 17 15   Height:      Weight:  71.4 kg (157 lb 6.5 oz) 71.5 kg (157 lb 10.1 oz)   SpO2: 97% 98% 100%    Physical Exam on HD - lowered goal to 1000 due to BP 90s General:NAD breathing easily alert - dentures in mouth Heart: RRR  Lungs: no rales anteriorally Abdomen: soft  Extremities: no sig edema; VAC on left AKA, right foot/heel wrapped Dialysis Access:  Left upper AVGG  Dialysis Orders: [facility] on MWF .  EDW 73kg HD Bath 4.0k, 2.25ca Time 4.o hr Heparin none Access LUA AVG BFR 450 DFR 1.5  0 Vit D HD Arasnep 167mcg qwed Units IV/HD Venofer 100 mg q weekly hd - last ARanesp 10/14 - venofer 10/9 no HECTOROL  OP Lab ipth 647 12/31/13 Hgb 9.8 10/14    Additional Objective Labs: Basic Metabolic Panel:  Recent Labs Lab 03/31/14 1127  03/31/14 1131 03/31/14 1840 04/01/14 0455  NA 135* 138  --  138  K 5.3 4.8  --  5.2  CL  --   --   --  99  CO2  --   --   --  25  GLUCOSE 93 89  --  77  BUN  --   --   --  15  CREATININE  --   --  3.80* 4.32*  CALCIUM  --   --   --  8.9   LCBC:  Recent Labs Lab 03/31/14 1131 03/31/14 1840 04/01/14 0455  WBC  --  5.6 6.1  HGB 13.3 10.8* 9.5*  HCT 39.0 34.8* 31.4*  MCV  --  84.7 85.6  PLT  --  342 350  CBG:  Recent Labs Lab 03/31/14 1331 03/31/14 1448 03/31/14 1638 03/31/14 2121 04/01/14 0631  GLUCAP 76 80 86 109* 92  Medications:   . aspirin  325 mg Oral QPM  . atorvastatin  20 mg Oral QHS  . darbepoetin (ARANESP) injection - DIALYSIS  100 mcg Intravenous Q Wed-HD  . docusate sodium  100 mg Oral Daily  . enoxaparin (LOVENOX) injection  30 mg Subcutaneous Q24H  . feeding supplement (RESOURCE BREEZE)  1 Container Oral BID BM  . gabapentin  200 mg Oral Daily  . insulin aspart  0-15 Units Subcutaneous TID WC  . insulin aspart  0-5 Units Subcutaneous QHS  . multivitamin  1 tablet Oral QHS  . pantoprazole  40 mg Oral Daily

## 2014-04-02 ENCOUNTER — Telehealth: Payer: Self-pay | Admitting: Vascular Surgery

## 2014-04-02 LAB — BASIC METABOLIC PANEL
ANION GAP: 11 (ref 5–15)
BUN: 8 mg/dL (ref 6–23)
CHLORIDE: 100 meq/L (ref 96–112)
CO2: 28 meq/L (ref 19–32)
CREATININE: 2.87 mg/dL — AB (ref 0.50–1.35)
Calcium: 8.5 mg/dL (ref 8.4–10.5)
GFR calc Af Amer: 23 mL/min — ABNORMAL LOW (ref >90)
GFR calc non Af Amer: 20 mL/min — ABNORMAL LOW (ref >90)
Glucose, Bld: 83 mg/dL (ref 70–99)
POTASSIUM: 4.2 meq/L (ref 3.7–5.3)
Sodium: 139 mEq/L (ref 137–147)

## 2014-04-02 LAB — CBC
HCT: 30.9 % — ABNORMAL LOW (ref 39.0–52.0)
HEMOGLOBIN: 9.4 g/dL — AB (ref 13.0–17.0)
MCH: 25.4 pg — ABNORMAL LOW (ref 26.0–34.0)
MCHC: 30.4 g/dL (ref 30.0–36.0)
MCV: 83.5 fL (ref 78.0–100.0)
Platelets: 319 10*3/uL (ref 150–400)
RBC: 3.7 MIL/uL — AB (ref 4.22–5.81)
RDW: 17.3 % — ABNORMAL HIGH (ref 11.5–15.5)
WBC: 5.8 10*3/uL (ref 4.0–10.5)

## 2014-04-02 LAB — GLUCOSE, CAPILLARY
Glucose-Capillary: 87 mg/dL (ref 70–99)
Glucose-Capillary: 122 mg/dL — ABNORMAL HIGH (ref 70–99)
Glucose-Capillary: 118 mg/dL — ABNORMAL HIGH (ref 70–99)

## 2014-04-02 NOTE — Progress Notes (Signed)
PT Cancellation Note  Patient Details Name: Sean [name] MRN: [id number] DOB: 1935-12-28   Cancelled Treatment:    Reason Eval/Treat Not Completed: Patient declined, no reason specified.   04/02/2014  Donnella Sham, [place] [phone]  (pager)   Worthy Boschert, Tessie Fass 04/02/2014, 4:51 PM

## 2014-04-02 NOTE — Progress Notes (Addendum)
[facility] Discharge Summary   Patient ID:  Sean [name] MRN: [id number] DOB/AGE: [birth year] 76 y.o.  Admit date: 03/31/2014 Discharge date: 04/02/2014 Date of Surgery: 03/31/2014 Surgeon: Surgeon(s): Elam Dutch, MD  Admission Diagnosis: Nonhealing Surgical Wound Left Above Knee Amputation  Discharge Diagnoses:  Nonhealing Surgical Wound Left Above Knee Amputation  Secondary Diagnoses: Past Medical History  Diagnosis Date  . Hyperparathyroidism   . Arthritis   . Hyperlipidemia   . Hypertension   . Neuromuscular disorder   . Stroke   . GERD (gastroesophageal reflux disease)   . Migraine   . Heart attack 2011; 1990's  . Peripheral neuropathy   . Anxiety   . Depression   . Dialysis patient     M, W, F; [employer] CAD (coronary artery disease)     a. s/p NSTEMI and failed RCA PCI in 2011. b. Nuc 12/2013: nonischemic, prior inferior MI.  Marland Kitchen LBBB (left bundle branch block)   . Calciphylaxis     a. Multiple admissions for nonhealing L ulcer/sepsis, s/p L AKA 02/02/2014.  Marland Kitchen Chronic anemia   . Dementia   . Type II diabetes mellitus     not taking any medications.  . ESRD (end stage renal disease) on dialysis     Procedure(s): IRRIGATION AND DEBRIDEMENT EXTREMITY - LEFT ABOVE KNEE AMPUTATION APPLICATION OF WOUND VAC - LEFT AKA  Discharged Condition: stable  HPI: Patient is a 78 year old male who is status post left above-knee amputation on August 24.  He has had increasing drainage from the left above-knee amputation. He is completely nonambulatory and several person assist. He is currently receiving Santyl on the wound and IV vancomycin.    Hospital Course:  Sean [name] is a 78 y.o. male is S/P Left Procedure(s): IRRIGATION AND DEBRIDEMENT EXTREMITY - LEFT ABOVE KNEE AMPUTATION APPLICATION OF WOUND VAC - LEFT AKA Stable post-op care.  He is receiving dialysis while he is here.  He will be discharged to SNF with the left  AKA wound vac in place.  He will follow up with Dr. Oneida Alar in 1 week.  He will continue to get vancomycin at dialysis.  Pharmacy consult:  Pt has been receiving vancomycin at outside facility for MRSA wound infection. Pre-dialysis vancomycin level returned subtheraputic. We recommend floating the right heel and daily dry dressing padding heel.  There is a fissure/callolus without open drainage on the right heel.    Consults:  Treatment Team:  Sol Blazing, MD  Significant Diagnostic Studies: CBC Lab Results  Component Value Date   WBC 5.8 04/02/2014   HGB 9.4* 04/02/2014   HCT 30.9* 04/02/2014   MCV 83.5 04/02/2014   PLT 319 04/02/2014    BMET    Component Value Date/Time   NA 139 04/02/2014 0500   K 4.2 04/02/2014 0500   CL 100 04/02/2014 0500   CO2 28 04/02/2014 0500   GLUCOSE 83 04/02/2014 0500   BUN 8 04/02/2014 0500   CREATININE 2.87* 04/02/2014 0500   CALCIUM 8.5 04/02/2014 0500   GFRNONAA 20* 04/02/2014 0500   GFRAA 23* 04/02/2014 0500   COAG Lab Results  Component Value Date   INR 1.86* 02/04/2014   INR 1.87* 02/04/2014   INR 1.37 01/28/2014     Disposition:  Discharge to :Skilled nursing facility Discharge Instructions   Call MD for:  redness, tenderness, or signs of infection (pain, swelling, bleeding, redness, odor or green/yellow discharge around incision site)  Complete by:  As directed      Call MD for:  severe or increased pain, loss or decreased feeling  in affected limb(s)    Complete by:  As directed      Call MD for:  temperature >100.5    Complete by:  As directed      Discharge wound care:    Complete by:  As directed   Wound vac at 125 mmHg continuous.     Resume previous diet    Complete by:  As directed             Medication List         acetaminophen 325 MG tablet  Commonly known as:  TYLENOL  Take 2 tablets (650 mg total) by mouth every 6 (six) hours as needed for mild pain, fever or headache.     albuterol (2.5 MG/3ML)  0.083% nebulizer solution  Commonly known as:  PROVENTIL  Take 3 mLs (2.5 mg total) by nebulization every 6 (six) hours as needed for wheezing or shortness of breath.     aspirin 325 MG tablet  Take 325 mg by mouth every evening.     atorvastatin 20 MG tablet  Commonly known as:  LIPITOR  Take 20 mg by mouth at bedtime.     b complex-vitamin c-folic acid 0.8 MG Tabs tablet  Take 0.8 mg by mouth at bedtime.     feeding supplement (RESOURCE BREEZE) Liqd  Take 1 Container by mouth 2 (two) times daily between meals.     gabapentin 100 MG capsule  Commonly known as:  NEURONTIN  Take 200 mg by mouth daily.     HYDROcodone-acetaminophen 5-325 MG per tablet  Commonly known as:  NORCO/VICODIN  Take 1 tablet by mouth every 6 (six) hours as needed for moderate pain or severe pain.     ondansetron 4 MG tablet  Commonly known as:  ZOFRAN  Take 4 mg by mouth every 8 (eight) hours as needed for nausea or vomiting.     polyethylene glycol packet  Commonly known as:  MIRALAX / GLYCOLAX  Take 17 g by mouth daily as needed for moderate constipation.     vancomycin 1,500 mg in sodium chloride 0.9 % 500 mL  Inject 1,500 mg into the vein once.       Verbal and written Discharge instructions given to the patient. Wound care per Discharge AVS     Follow-up Information   Follow up with Elam Dutch, MD In 1 week. (Office will call you to arrange your appt (sent))    Specialty:  Vascular Surgery   Contact information:   [place] [address] [phone]       Signed: Laurence Slate [facility] 04/02/2014, 10:06 AM

## 2014-04-02 NOTE — Progress Notes (Addendum)
     Subjective  - Doing well over all.  No new complaints.   Objective 114/40 95 98.9 F (37.2 C) (Oral) 18 95%  Intake/Output Summary (Last 24 hours) at 04/02/14 0731 Last data filed at 04/01/14 1700  Gross per 24 hour  Intake    360 ml  Output      0 ml  Net    360 ml   Vac in place left AKA wound Thigh is soft and non tender   Assessment/Planning: Debridement of left above-knee amputation  Poorly healing left below-knee amputation rule out calciphylaxis No growth from surgical tissue samples to date  Pending SNF and wound vac care  Patient needs FL2 signed      Laurence Slate [facility] 04/02/2014 7:31 AM -- Agree with above.  D/c to SNF when [facility] available. FL2 signed  Ruta Hinds, MD [facility] [place] Office: [phone] Pager: [phone]  Laboratory Lab Results:  Recent Labs  04/01/14 0455 04/02/14 0500  WBC 6.1 5.8  HGB 9.5* 9.4*  HCT 31.4* 30.9*  PLT 350 319   BMET  Recent Labs  04/01/14 0455 04/02/14 0500  NA 138 139  K 5.2 4.2  CL 99 100  CO2 25 28  GLUCOSE 77 83  BUN 15 8  CREATININE 4.32* 2.87*  CALCIUM 8.9 8.5    COAG Lab Results  Component Value Date   INR 1.86* 02/04/2014   INR 1.87* 02/04/2014   INR 1.37 01/28/2014   No results found for this basename: PTT

## 2014-04-02 NOTE — Clinical Social Work Note (Signed)
Attempted to contact patient's daughter Chinita due to message received to call her back this morning.  Attempted to call back twice and left a message each time.  Contacted patient's other daughter [name] to discuss what the phone call was about.  Patient's daughter was informed that patient is ready to discharge back to [facility] today.  Patient's daughter requested around 4:30 for transport.  Awaiting discharge summary and orders.  Jones Broom. [place], MSW, [place] 04/02/2014 2:59 PM

## 2014-04-02 NOTE — Progress Notes (Signed)
Assessment unchanged. D/C instructions RX placed in packet and given to EMS.  IV and tele removed. Wound VAC dressing taken down as pt is on hospital VAC. Wet to dry dressing placed on site and wrapped in [place]. Pt left with belongings accompanied by EMS. Report called to facility.

## 2014-04-02 NOTE — Telephone Encounter (Signed)
Message copied by Doristine Section on Thu Apr 02, 2014  2:29 PM ------      Message from: Denman George      Created: Tue Mar 31, 2014  2:56 PM      Regarding: needs f/u appt. 1-2 wks. w/ [facility]; please inform NH about need to have access to check left AKA stump on f/u                    ----- Message -----         From: Gabriel Earing, PA-C         Sent: 03/31/2014   2:39 PM           To: Vvs Charge Pool            Debridement of left AKA stump 03/31/14.             F/u with Dr. Oneida Alar in 1-2 weeks.  Please make sure SNF does not send him in pants that cover his stump.  We do not have a lift to lift the pt to get them off.            Thanks,      Aldona Bar ------

## 2014-04-02 NOTE — Progress Notes (Signed)
[facility] Progress Note  Assessment: 1. S/P debridement of left AKA - wound cultures growing MRSA 10/26 at outpt NH, on IV Vanc at HD since 10/16. Now vanc per pharm 2. ESRD - MWF - HD K 5.2 - need to change outpt bath to 2 K 2 Ca 3. Hypotension/volume - BP tend to run 80s - 100 at outpt HD - low gains ~ 0.5 kg; CXR clear 4. Anemia - continue ESA - Hgb 9.5 , cont aranesp 5. Metabolic bone disease - No hectorol due to history of calciphylaxis, high PTH w only 3 glands found at parathyroidectomy. Have d/w Dr Harlow Asa, will do nuc med scan  6. Nutrition - poor - advance diet as able; needs more aggressive supplmentation when he returns to NH 7. Cardiomyopathy - ischemic CM EF 25% by nuc med study 7/15 8. Disp - anticipate return today to NH   Plan - HD tomorrow, considering doing a nuclear sestamibi scan to look for a fourth gland in this pt with recurrent problems with calciphylaxis and a high PTH  [name] MD pager [phone]    cell [phone] 04/02/2014, 9:23 AM    Dialysis: [facility] MWF  EDW 73kg HD Bath 4.0k, 2.25ca Time 4.o hr Heparin none Access LUA AVG BFR 450 DFR 1.5  0 Vit D HD Arasnep 148mcg qwed Units IV/HD Venofer 100 mg q weekly hd - last ARanesp 10/14 - venofer 10/9 no HECTOROL  OP Lab ipth 647 12/31/13 Hgb 9.8 10/14   Subjective:   Hasn't gotten much to eat; found teeth  Objective Filed Vitals:   04/01/14 1449 04/01/14 2132 04/02/14 0404 04/02/14 0406  BP: 110/47 96/54 114/40   Pulse: 72 86 95   Temp: 98.2 F (36.8 C) 99.2 F (37.3 C) 98.9 F (37.2 C)   TempSrc: Oral Oral Oral   Resp: 18 18    Height:      Weight:    72 kg (158 lb 11.7 oz)  SpO2: 99% 100% 95%    Physical Exam on HD - lowered goal to 1000 due to BP 90s General:NAD breathing easily alert - dentures in mouth Heart: RRR  Lungs: no rales anteriorally Abdomen: soft  Extremities: no sig edema; VAC on left AKA, right foot/heel wrapped Dialysis Access:  Left upper AVGG  Dialysis  Orders: [facility] on MWF .  EDW 73kg HD Bath 4.0k, 2.25ca Time 4.o hr Heparin none Access LUA AVG BFR 450 DFR 1.5  0 Vit D HD Arasnep 193mcg qwed Units IV/HD Venofer 100 mg q weekly hd - last ARanesp 10/14 - venofer 10/9 no HECTOROL  OP Lab ipth 647 12/31/13 Hgb 9.8 10/14    Additional Objective Labs: Basic Metabolic Panel:  Recent Labs Lab 03/31/14 1131 03/31/14 1840 04/01/14 0455 04/02/14 0500  NA 138  --  138 139  K 4.8  --  5.2 4.2  CL  --   --  99 100  CO2  --   --  25 28  GLUCOSE 89  --  77 83  BUN  --   --  15 8  CREATININE  --  3.80* 4.32* 2.87*  CALCIUM  --   --  8.9 8.5   LCBC:  Recent Labs Lab 03/31/14 1840 04/01/14 0455 04/02/14 0500  WBC 5.6 6.1 5.8  HGB 10.8* 9.5* 9.4*  HCT 34.8* 31.4* 30.9*  MCV 84.7 85.6 83.5  PLT 342 350 319  CBG:  Recent Labs Lab 04/01/14 0631 04/01/14 1308 04/01/14 1708 04/01/14 2128  04/02/14 0614  GLUCAP 92 95 117* 118* 87  Medications:   . aspirin  325 mg Oral QPM  . atorvastatin  20 mg Oral QHS  . darbepoetin (ARANESP) injection - DIALYSIS  100 mcg Intravenous Q Wed-HD  . docusate sodium  100 mg Oral Daily  . enoxaparin (LOVENOX) injection  30 mg Subcutaneous Q24H  . feeding supplement (RESOURCE BREEZE)  1 Container Oral BID BM  . gabapentin  200 mg Oral Daily  . insulin aspart  0-15 Units Subcutaneous TID WC  . insulin aspart  0-5 Units Subcutaneous QHS  . multivitamin  1 tablet Oral QHS  . pantoprazole  40 mg Oral Daily

## 2014-04-02 NOTE — Clinical Social Work Note (Signed)
Patient's other daughter Chinita who contacted this social worker and she wanted to have her father go to a different facility.  Contacted [employer] to find out if patient's daughter Jorene Guest, or son Lynnell Chad had spoken to facility, and she said no, they have not received any phone calls regarding placement of patient.  [facility] said they can not take the patient today but to send information and they can look at it for a potential future placement if family chooses.  Informed patient's family that [facility] facility is the only place that has a bed available today, and patient has discharge orders for today.  Discussion with patient's son, this Education officer, museum and social work Mudlogger, explained to son that [facility] is the only place available for tonight.  Patient to be d/c'ed today to [facility].  Patient and family aware of placement at [facility].  Patient will be transported via ems RN to call report. Evette Cristal, MSW, [place]

## 2014-04-03 ENCOUNTER — Telehealth: Payer: Self-pay | Admitting: Vascular Surgery

## 2014-04-03 NOTE — Telephone Encounter (Signed)
Notfied Sabrina at [employer] of patient's post op appointment on 04-16-14 at 10:15, as asked her to make note not to send pt. in pants that cover his stump.

## 2014-04-06 ENCOUNTER — Other Ambulatory Visit (INDEPENDENT_AMBULATORY_CARE_PROVIDER_SITE_OTHER): Payer: Self-pay

## 2014-04-06 DIAGNOSIS — E213 Hyperparathyroidism, unspecified: Secondary | ICD-10-CM

## 2014-04-07 ENCOUNTER — Non-Acute Institutional Stay (SKILLED_NURSING_FACILITY): Payer: Medicare Other | Admitting: Adult Health

## 2014-04-07 ENCOUNTER — Encounter: Payer: Self-pay | Admitting: Adult Health

## 2014-04-07 DIAGNOSIS — E785 Hyperlipidemia, unspecified: Secondary | ICD-10-CM

## 2014-04-07 DIAGNOSIS — Z992 Dependence on renal dialysis: Secondary | ICD-10-CM

## 2014-04-07 DIAGNOSIS — L98499 Non-pressure chronic ulcer of skin of other sites with unspecified severity: Secondary | ICD-10-CM

## 2014-04-07 DIAGNOSIS — N186 End stage renal disease: Secondary | ICD-10-CM

## 2014-04-07 DIAGNOSIS — N2581 Secondary hyperparathyroidism of renal origin: Secondary | ICD-10-CM

## 2014-04-07 DIAGNOSIS — T8789 Other complications of amputation stump: Secondary | ICD-10-CM

## 2014-04-07 DIAGNOSIS — I739 Peripheral vascular disease, unspecified: Secondary | ICD-10-CM

## 2014-04-07 DIAGNOSIS — I70209 Unspecified atherosclerosis of native arteries of extremities, unspecified extremity: Secondary | ICD-10-CM

## 2014-04-07 MED ORDER — HYDROCODONE-ACETAMINOPHEN 10-325 MG PO TABS: 1.0000 | ORAL_TABLET | ORAL | Status: DC | PRN

## 2014-04-07 NOTE — Progress Notes (Signed)
Patient ID: Sean [name], male   DOB: [birth year], 78 y.o.   MRN: [id number]     [facility]  Allergies  Allergen Reactions  . Benazepril Hcl Hives, Itching and Rash    flareups on patient head     Chief Complaint  Patient presents with  . Hospitalization Follow-up    HPI:  He has been hospitalized for a left aka stump which has a mrsa infection in the incision line. He is on vancomycin iv at dialysis for his infection. He did have a percutaneous chole tube  Placed on 02-04-14 and removed on 03-27-14. He is having a great deal of pain present in his stump from the wound vac and does not know how much longer he is going to be able to tolerate this procedure.  He tells me that this time his goal is to be able to return back home. I am not sure if this is going to be a reasonable goal for him.    Past Medical History  Diagnosis Date  . Hyperparathyroidism   . Arthritis   . Hyperlipidemia   . Hypertension   . Neuromuscular disorder   . Stroke   . GERD (gastroesophageal reflux disease)   . Migraine   . Heart attack 2011; 1990's  . Peripheral neuropathy   . Anxiety   . Depression   . Dialysis patient     M, W, F; [place] CAD (coronary artery disease)     a. s/p NSTEMI and failed RCA PCI in 2011. b. Nuc 12/2013: nonischemic, prior inferior MI.  Marland Kitchen LBBB (left bundle branch block)   . Calciphylaxis     a. Multiple admissions for nonhealing L ulcer/sepsis, s/p L AKA 02/02/2014.  Marland Kitchen Chronic anemia   . Dementia   . Type II diabetes mellitus     not taking any medications.  . ESRD (end stage renal disease) on dialysis     Past Surgical History  Procedure Laterality Date  . Colon surgery  2011    colon resection   . Parathyroidectomy  2011  . Capd insertion  09/12/2011    Procedure: LAPAROSCOPIC INSERTION CONTINUOUS AMBULATORY PERITONEAL DIALYSIS  (CAPD) CATHETER;  Surgeon: Edward Jolly, MD;  Location: [facility];  Service: General;  Laterality: N/A;  . Sp av  dialysis shunt access existing *l*  2003  . Dg av dialysis shunt intro ndl*r* or      "not working" (09/19/11)  . Capd removal  03/12/2012    Procedure: CONTINUOUS AMBULATORY PERITONEAL DIALYSIS  (CAPD) CATHETER REMOVAL;  Surgeon: Edward Jolly, MD;  Location: [facility];  Service: General;  Laterality: N/A;  . Amputation Left 02/02/2014    Procedure: AMPUTATION ABOVE KNEE-LEFT;  Surgeon: Elam Dutch, MD;  Location: [facility];  Service: Vascular;  Laterality: Left;  . I&d extremity Left 03/31/2014    Procedure: IRRIGATION AND DEBRIDEMENT EXTREMITY - LEFT ABOVE KNEE AMPUTATION;  Surgeon: Elam Dutch, MD;  Location: [facility];  Service: Vascular;  Laterality: Left;  . Application of wound vac Left 03/31/2014    Procedure: APPLICATION OF WOUND VAC - LEFT AKA;  Surgeon: Elam Dutch, MD;  Location: [facility] OR;  Service: Vascular;  Laterality: Left;    VITAL SIGNS BP 120/58  Pulse 56  Ht 5\' 5"  (1.651 m)  Wt 164 lb (74.39 kg)  BMI 27.29 kg/m2   Patient's Medications  New Prescriptions   No medications on file  Previous Medications   ACETAMINOPHEN (  TYLENOL) 325 MG TABLET    Take 2 tablets (650 mg total) by mouth every 6 (six) hours as needed for mild pain, fever or headache.   ALBUTEROL (PROVENTIL) (2.5 MG/3ML) 0.083% NEBULIZER SOLUTION    Take 3 mLs (2.5 mg total) by nebulization every 6 (six) hours as needed for wheezing or shortness of breath.   ASPIRIN 325 MG TABLET    Take 325 mg by mouth every evening.   ATORVASTATIN (LIPITOR) 20 MG TABLET    Take 20 mg by mouth at bedtime.   B COMPLEX-VITAMIN C-FOLIC ACID (NEPHRO-VITE) 0.8 MG TABS    Take 0.8 mg by mouth at bedtime.   FEEDING SUPPLEMENT, RESOURCE BREEZE, (RESOURCE BREEZE) LIQD    Take 1 Container by mouth 2 (two) times daily between meals.   GABAPENTIN (NEURONTIN) 100 MG CAPSULE    Take 200 mg by mouth daily.   HYDROCODONE-ACETAMINOPHEN (NORCO/VICODIN) 5-325 MG PER TABLET    Take 1 tablet by mouth every 6 (six) hours as needed for  moderate pain or severe pain.   ONDANSETRON (ZOFRAN) 4 MG TABLET    Take 4 mg by mouth every 8 (eight) hours as needed for nausea or vomiting.   POLYETHYLENE GLYCOL (MIRALAX / GLYCOLAX) PACKET    Take 17 g by mouth daily as needed for moderate constipation.   VANCOMYCIN 1,500 MG IN SODIUM CHLORIDE 0.9 % 500 ML    Inject 1,500 mg into the vein once.  Modified Medications   No medications on file  Discontinued Medications   No medications on file    SIGNIFICANT DIAGNOSTIC EXAMS  12-13-13: chest x-ray: No active disease.  12-13-13: ct of head: Old left posterior parietal infarct with encephalomalacia. Atrophy, chronic small vessel disease. No acute intracranial abnormality.  12-14-13: myocardial perfusion: 1. Prior MI involving the inferior wall and apex. 2. No evidence of myocardial ischemia. 3. Left ventricular enlargement.4. Global hypokinesis with essential akinesis in the inferior wall. 5. Estimated QGS ejection fraction 25%.   12-15-13: TEE: Left ventricle: Poorly visualized consider repeat with contrast Abnormal septal motion. The cavity size was mildly dilated. Wall thickness was normal. Systolic function was mildly reduced. The estimated ejection fraction was in the range of 45% to 50%. - Mitral valve: Calcified annulus. - Left atrium: The atrium was mildly dilated. - Atrial septum: No defect or patent foramen ovale was identified.  12-20-13: chest x-ray: LEFT basilar atelectasis.  01-07-14: chest x-ray: negative for tb. Cardiomegaly and central pulmonary vascular congestion without other overt chf findings.   02-03-14: 2-d echo: Left ventricle: Markedly abnormal septal motion EF hard to estimate given this and poor image quality but likely mild to moderately reduced. The cavity size was normal. There was mild concentric hypertrophy. - Mitral valve: Calcified leaflets and MAC with no stenosis and mild MR. - Left atrium: The atrium was moderately dilated.  03-31-14: chest x-ray: No acute  cardiopulmonary abnormality seen.     LABS REVIEWED:   12-15-13: pth: 802.1 12-20-13: wbc 7.8; hgb 10.3; hct 32.0; mcv 84.2; plt 316; glucose 153; bun 34; creat 8.78; k+3.7; na++136; liver normal albumin 2.7 12-21-13: tsh 1.050; vit b12: 1157 12-24-13; wbc 7.6;hgb 8.6; hct 27.3; mcv 86.4; plt 314; glucose 127; bun 34; creat 8.72; k+4.5; na++135; phos 3.8; albumin 2.4 02-06-14: hgb a1c 5.9  03-31-14: wbc 5.6; hgb 10.8; hct 34.8; mcv 84.7; plt 342; glucose 89; k+ 4.8; na++138 04-01-14: wbc 6.1; hgb 9.5; hct 31.4; mcv 85.; plt 350; glucose 77; bun 15; creat 4.32; k+ 5.2;  na++ 138      Review of Systems  Constitutional: Negative for malaise/fatigue.  Respiratory: Negative for cough and shortness of breath.   Cardiovascular: Negative for chest pain, palpitations and leg swelling.  Gastrointestinal: Negative for heartburn, abdominal pain and constipation.  Musculoskeletal: Positive for joint pain and myalgias.  Skin:       Has wound on left aka which is very painful   Psychiatric/Behavioral: Negative for depression. The patient is not nervous/anxious.     Physical Exam  Constitutional: He is oriented to person, place, and time. He appears well-developed and well-nourished. No distress.  Neck: Neck supple. No JVD present. No thyromegaly present.  Cardiovascular: Normal rate and regular rhythm.   Right pedal pulse faint   Respiratory: Effort normal. No respiratory distress.  GI: Soft. Bowel sounds are normal. He exhibits no distension. There is no tenderness.  Musculoskeletal:  Is able to move upper extremities Is able to move right lower extremity Is status post left aka  Neurological: He is alert and oriented to person, place, and time.  Skin: Skin is warm and dry. He is not diaphoretic.  Left aka stump: on wound vac: wound is superficial with granulating wound bed without signs of infection present.   Left arm a/v fistula + thrill + bruit   Psychiatric:  Is anxious      ASSESSMENT/ PLAN:  1. ESRD: will continue hemodialysis three days per week will continue 1200 cc fluid restriction and will continue to monitor his status.   2. Non-healing amputation site with sepsis: will stop his wound vac at this time as this therapy is not indicated. Will begin  Collagen to the wound bed per facility protocol. Will have him follow up with vascular surgeon as indicated. Will continue IV vancomycin per dialysis as directed and will continue to monitor his status. His wound is healing at this time.   3. Peripheral neuropathy: he does have pain present from his wound area present; will continue his neurontin 200 mg daily; will change his vicodin to 10/325 mg every 4 hours as needed and will monitor his status.   4. Dyslipidemia: will continue lipitor 20 mg daily   5. CVA: he is neurologically stable: will continue asa 325 mg daily and will monitor   6. Secondary hyperparathyroidism: is without change in status will not make changes and will monitor his status.   7. Cholecystitis: his cholecystomy tube has been removed on 03-27-14. There have been no further complaints of pain present. Will monitor his status.     Time spent with patient 50 minutes   Ok Edwards NP [facility]  Contact [phone] Monday through Friday 8am- 5pm  After hours call [phone]

## 2014-04-07 NOTE — Discharge Summary (Deleted)
[facility] Discharge Summary  Patient ID:  Sean [name]  MRN: [id number]  DOB/AGE: 11/28/1935 78 y.o.  Admit date: 03/31/2014  Discharge date: 04/02/2014  Date of Surgery: 03/31/2014  Surgeon: Surgeon(s):  Elam Dutch, MD  Admission Diagnosis:  Nonhealing Surgical Wound Left Above Knee Amputation  Discharge Diagnoses:  Nonhealing Surgical Wound Left Above Knee Amputation  Secondary Diagnoses:    Past Medical History    Diagnosis  Date    .  Hyperparathyroidism     .  Arthritis     .  Hyperlipidemia     .  Hypertension     .  Neuromuscular disorder     .  Stroke     .  GERD (gastroesophageal reflux disease)     .  Migraine     .  Heart attack  2011; 1990's    .  Peripheral neuropathy     .  Anxiety     .  Depression     .  Dialysis patient       M, W, F; [place]  CAD (coronary artery disease)       a. s/p NSTEMI and failed RCA PCI in 2011. b. Nuc 12/2013: nonischemic, prior inferior MI.    Marland Kitchen  LBBB (left bundle branch block)     .  Calciphylaxis       a. Multiple admissions for nonhealing L ulcer/sepsis, s/p L AKA 02/02/2014.    Marland Kitchen  Chronic anemia     .  Dementia     .  Type II diabetes mellitus       not taking any medications.    .  ESRD (end stage renal disease) on dialysis      Procedure(s):  IRRIGATION AND DEBRIDEMENT EXTREMITY - LEFT ABOVE KNEE AMPUTATION  APPLICATION OF WOUND VAC - LEFT AKA  Discharged Condition: stable  HPI: Patient is a 78 year old male who is status post left above-knee amputation on August 24. He has had increasing drainage from the left above-knee amputation. He is completely nonambulatory and several person assist. He is currently receiving Santyl on the wound and IV vancomycin.  Hospital Course:  Sean [name] is a 78 y.o. male is S/P Left  Procedure(s):  IRRIGATION AND DEBRIDEMENT EXTREMITY - LEFT ABOVE KNEE AMPUTATION  APPLICATION OF WOUND VAC - LEFT AKA  Stable post-op care. He is receiving  dialysis while he is here. He will be discharged to SNF with the left AKA wound vac in place. He will follow up with Dr. Oneida Alar in 1 week. He will continue to get vancomycin at dialysis. Pharmacy consult: Pt has been receiving vancomycin at outside facility for MRSA wound infection. Pre-dialysis vancomycin level returned subtheraputic. We recommend floating the right heel and daily dry dressing padding heel. There is a fissure/callolus without open drainage on the right heel.  Consults:  Treatment Team:  Sol Blazing, MD  Significant Diagnostic Studies:  CBC    Lab Results    Component  Value  Date     WBC  5.8  04/02/2014     HGB  9.4*  04/02/2014     HCT  30.9*  04/02/2014     MCV  83.5  04/02/2014     PLT  319  04/02/2014     BMET       Component  Value  Date/Time     NA  139  04/02/2014 0500  K  4.2  04/02/2014 0500     CL  100  04/02/2014 0500     CO2  28  04/02/2014 0500     GLUCOSE  83  04/02/2014 0500     BUN  8  04/02/2014 0500     CREATININE  2.87*  04/02/2014 0500     CALCIUM  8.5  04/02/2014 0500     GFRNONAA  20*  04/02/2014 0500     GFRAA  23*  04/02/2014 0500     COAG    Lab Results    Component  Value  Date     INR  1.86*  02/04/2014     INR  1.87*  02/04/2014     INR  1.37  01/28/2014     Disposition:  Discharge to :Skilled nursing facility    Discharge Instructions     Call MD for: redness, tenderness, or signs of infection (pain, swelling, bleeding, redness, odor or green/yellow discharge around incision site)  Complete by: As directed         Call MD for: severe or increased pain, loss or decreased feeling in affected limb(s)  Complete by: As directed         Call MD for: temperature >100.5  Complete by: As directed         Discharge wound care:  Complete by: As directed     Wound vac at 125 mmHg continuous.      Resume previous diet  Complete by: As directed                     Medication List            acetaminophen 325 MG tablet       Commonly known as: TYLENOL      Take 2 tablets (650 mg total) by mouth every 6 (six) hours as needed for mild pain, fever or headache.      albuterol (2.5 MG/3ML) 0.083% nebulizer solution      Commonly known as: PROVENTIL      Take 3 mLs (2.5 mg total) by nebulization every 6 (six) hours as needed for wheezing or shortness of breath.      aspirin 325 MG tablet      Take 325 mg by mouth every evening.      atorvastatin 20 MG tablet      Commonly known as: LIPITOR      Take 20 mg by mouth at bedtime.      b complex-vitamin c-folic acid 0.8 MG Tabs tablet      Take 0.8 mg by mouth at bedtime.      feeding supplement (RESOURCE BREEZE) Liqd      Take 1 Container by mouth 2 (two) times daily between meals.      gabapentin 100 MG capsule      Commonly known as: NEURONTIN      Take 200 mg by mouth daily.      HYDROcodone-acetaminophen 5-325 MG per tablet      Commonly known as: NORCO/VICODIN      Take 1 tablet by mouth every 6 (six) hours as needed for moderate pain or severe pain.      ondansetron 4 MG tablet      Commonly known as: ZOFRAN      Take 4 mg by mouth every 8 (eight) hours as needed for nausea or vomiting.      polyethylene glycol packet  Commonly known as: MIRALAX / GLYCOLAX      Take 17 g by mouth daily as needed for moderate constipation.      vancomycin 1,500 mg in sodium chloride 0.9 % 500 mL      Inject 1,500 mg into the vein once.         Verbal and written Discharge instructions given to the patient. Wound care per Discharge AVS          Follow-up Information     Follow up with Elam Dutch, MD In 1 week. (Office will call you to arrange your appt (sent))     Specialty: Vascular Surgery     Contact information:     [place] [address]  [phone]        Signed:  Laurence Slate [facility]  04/02/2014, 10:06 AM

## 2014-04-14 ENCOUNTER — Encounter (HOSPITAL_COMMUNITY): Payer: Medicare Other

## 2014-04-15 ENCOUNTER — Encounter: Payer: Self-pay | Admitting: Vascular Surgery

## 2014-04-16 ENCOUNTER — Encounter: Payer: Medicare Other | Admitting: Vascular Surgery

## 2014-04-22 ENCOUNTER — Encounter: Payer: Self-pay | Admitting: Vascular Surgery

## 2014-04-23 ENCOUNTER — Ambulatory Visit (INDEPENDENT_AMBULATORY_CARE_PROVIDER_SITE_OTHER): Payer: Medicare Other | Admitting: Vascular Surgery

## 2014-04-23 ENCOUNTER — Encounter: Payer: Self-pay | Admitting: Vascular Surgery

## 2014-04-23 ENCOUNTER — Ambulatory Visit: Payer: Medicare Other | Admitting: Vascular Surgery

## 2014-04-23 VITALS — BP 120/58 | HR 96 | Ht 65.0 in | Wt 164.0 lb

## 2014-04-23 DIAGNOSIS — L98499 Non-pressure chronic ulcer of skin of other sites with unspecified severity: Secondary | ICD-10-CM

## 2014-04-23 DIAGNOSIS — I70209 Unspecified atherosclerosis of native arteries of extremities, unspecified extremity: Secondary | ICD-10-CM

## 2014-04-23 DIAGNOSIS — I739 Peripheral vascular disease, unspecified: Secondary | ICD-10-CM

## 2014-04-23 NOTE — Progress Notes (Signed)
Patient is a 78 year old male who is status post left above-knee amputation on August 24. He returns today for followup. He has some dementia. 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 He is currently residing in a skilled nursing facility, [facility].  His dialysis days Monday Wednesday Friday. He is completely nonambulatory and several person assist. He had previously used a VAC dressing and returns for follow up today     Exam: Filed Vitals:   04/23/14 0831  BP: 120/58  Pulse: 96  Height: 5\' 5"  (1.651 m)  Weight: 164 lb (74.39 kg)  SpO2: 100%    The left above-knee amputation still has some mild edema. There is clean granulation tissue and open wound at lateral and medial edge of AKA each about 3 cm length 1.5 cm width less than 1 mm depth  Right knee area with 3 2 cm superficial ulcers which look like calciphylaxis  Assessment: Slowly Healing left above-knee amputation   Plan: Hydrogel dressing left AKA once daily follow up 3 weeks   Ruta Hinds, MD [facility] [place] Office: [phone] Pager: [phone]

## 2014-04-29 ENCOUNTER — Encounter: Payer: Self-pay | Admitting: Registered Nurse

## 2014-04-29 ENCOUNTER — Non-Acute Institutional Stay (SKILLED_NURSING_FACILITY): Payer: Medicare Other | Admitting: Registered Nurse

## 2014-04-29 DIAGNOSIS — E785 Hyperlipidemia, unspecified: Secondary | ICD-10-CM

## 2014-04-29 DIAGNOSIS — G629 Polyneuropathy, unspecified: Secondary | ICD-10-CM

## 2014-04-29 DIAGNOSIS — Z992 Dependence on renal dialysis: Secondary | ICD-10-CM

## 2014-04-29 DIAGNOSIS — T8789 Other complications of amputation stump: Secondary | ICD-10-CM

## 2014-04-29 DIAGNOSIS — N186 End stage renal disease: Secondary | ICD-10-CM

## 2014-04-29 DIAGNOSIS — L89150 Pressure ulcer of sacral region, unstageable: Secondary | ICD-10-CM

## 2014-04-29 NOTE — Progress Notes (Signed)
This encounter was created in error - please disregard.

## 2014-04-29 NOTE — Progress Notes (Signed)
Patient ID: Sean [name], male   DOB: [birth year], 78 y.o.   MRN: [id number]   Place of Service: [facility]  Allergies  Allergen Reactions  . Benazepril Hcl Hives, Itching and Rash    flareups on patient head    Code Status: Full Code  Goals of Care: Longevity/STR  Chief Complaint  Patient presents with  . Medical Management of Chronic Issues    L AKA infection, ESRD, HLD, peripheral neuropathy    HPI 78 y.o. male with PMH of ESRD, HLD, left AKA stump infection, peripheral neuropathy among others is being seen for a routine visit for management of his chronic issues. Weight has been stable. No new skin concerns reported. Left AKA surgical wound is getting better with collagen dressing. Unstageable sacral ulcer is also getting better on Santyl gel. No change in functional status or change in behaviors reported. No concerns from staff. No concerns verbalized from patient. His goal of care is still to return home.   Review of Systems Constitutional: Negative for fever, chills, and fatigue. HENT: Negative for congestion, and sore throat Eyes: Negative for eye pain, eye discharge, and visual disturbance  Cardiovascular: Negative for chest pain, palpitations, and leg swelling Respiratory: Negative cough, shortness of breath, and wheezing.  Gastrointestinal: Negative for nausea and vomiting. Negative for abdominal pain, diarrhea and constipation.  Genitourinary: Negative for  Dysuria Musculoskeletal: Positive for pain in right leg and left aka stump.  Neurological: Negative for dizziness and headache Skin: Negative for rash. Psychiatric: Negative for depression  Past Medical History  Diagnosis Date  . Hyperparathyroidism   . Arthritis   . Hyperlipidemia   . Hypertension   . Neuromuscular disorder   . Stroke   . GERD (gastroesophageal reflux disease)   . Migraine   . Heart attack 2011; 1990's  . Peripheral neuropathy   . Anxiety   . Depression   . Dialysis  patient     M, W, F; [place] CAD (coronary artery disease)     a. s/p NSTEMI and failed RCA PCI in 2011. b. Nuc 12/2013: nonischemic, prior inferior MI.  Marland Kitchen LBBB (left bundle branch block)   . Calciphylaxis     a. Multiple admissions for nonhealing L ulcer/sepsis, s/p L AKA 02/02/2014.  Marland Kitchen Chronic anemia   . Dementia   . Type II diabetes mellitus     not taking any medications.  . ESRD (end stage renal disease) on dialysis     Past Surgical History  Procedure Laterality Date  . Colon surgery  2011    colon resection   . Parathyroidectomy  2011  . Capd insertion  09/12/2011    Procedure: LAPAROSCOPIC INSERTION CONTINUOUS AMBULATORY PERITONEAL DIALYSIS  (CAPD) CATHETER;  Surgeon: Edward Jolly, MD;  Location: [place];  Service: General;  Laterality: N/A;  . Sp av dialysis shunt access existing *l*  2003  . Dg av dialysis shunt intro ndl*r* or      "not working" (09/19/11)  . Capd removal  03/12/2012    Procedure: CONTINUOUS AMBULATORY PERITONEAL DIALYSIS  (CAPD) CATHETER REMOVAL;  Surgeon: Edward Jolly, MD;  Location: [place];  Service: General;  Laterality: N/A;  . Amputation Left 02/02/2014    Procedure: AMPUTATION ABOVE KNEE-LEFT;  Surgeon: Elam Dutch, MD;  Location: [place];  Service: Vascular;  Laterality: Left;  . I&d extremity Left 03/31/2014    Procedure: IRRIGATION AND DEBRIDEMENT EXTREMITY - LEFT ABOVE KNEE AMPUTATION;  Surgeon: Juanda Crumble  Antony Blackbird, MD;  Location: [place];  Service: Vascular;  Laterality: Left;  . Application of wound vac Left 03/31/2014    Procedure: APPLICATION OF WOUND VAC - LEFT AKA;  Surgeon: Elam Dutch, MD;  Location: [place];  Service: Vascular;  Laterality: Left;    History   Social History  . Marital Status: Married    Spouse Name: N/A    Number of Children: N/A  . Years of Education: N/A   Occupational History  . Not on file.   Social History Main Topics  . Smoking status: Former Smoker -- 0.25 packs/day for 7 years     Types: Cigarettes    Quit date: 06/12/1976  . Smokeless tobacco: Never Used  . Alcohol Use: No  . Drug Use: No  . Sexual Activity: No   Other Topics Concern  . Not on file   Social History Narrative      Medication List       This list is accurate as of: 04/29/14  2:15 PM.  Always use your most recent med list.               acetaminophen 325 MG tablet  Commonly known as:  TYLENOL  Take 2 tablets (650 mg total) by mouth every 6 (six) hours as needed for mild pain, fever or headache.     albuterol (2.5 MG/3ML) 0.083% nebulizer solution  Commonly known as:  PROVENTIL  Take 3 mLs (2.5 mg total) by nebulization every 6 (six) hours as needed for wheezing or shortness of breath.     aspirin 325 MG tablet  Take 325 mg by mouth every evening.     atorvastatin 20 MG tablet  Commonly known as:  LIPITOR  Take 20 mg by mouth at bedtime.     b complex-vitamin c-folic acid 0.8 MG Tabs tablet  Take 0.8 mg by mouth at bedtime.     feeding supplement (RESOURCE BREEZE) Liqd  Take 1 Container by mouth 2 (two) times daily between meals.     gabapentin 100 MG capsule  Commonly known as:  NEURONTIN  Take 200 mg by mouth daily.     HYDROcodone-acetaminophen 10-325 MG per tablet  Commonly known as:  NORCO  Take 1 tablet by mouth every 4 (four) hours as needed.     ondansetron 4 MG tablet  Commonly known as:  ZOFRAN  Take 4 mg by mouth every 8 (eight) hours as needed for nausea or vomiting.     vitamin C 500 MG tablet  Commonly known as:  ASCORBIC ACID  Take 500 mg by mouth 2 (two) times daily.        Physical Exam Filed Vitals:   04/28/14 1700  BP: 136/69  Pulse: 77  Temp: 98.3 F (36.8 C)  Resp: 18   Constitutional: WDWN elderly male in no acute distress. Conversant. Pleasant.  HEENT: Normocephalic and atraumatic. PERRL. EOM intact. No icterus. Oral mucosa moist. Posterior pharynx clear of any exudate or lesions.  Neck: Supple and nontender. No lymphadenopathy,  masses, or thyromegaly. No JVD or carotid bruits. Cardiac: Normal S1, S2. RRR without appreciable murmurs, rubs, or gallops. Right distal pulse 1+. Left AVF with positive bruit and thrill.  Lungs: No respiratory distress. Breath sounds clear bilaterally without rales, rhonchi, or wheezes. Abdomen: Audible bowel sounds in all quadrants. Soft, nontender, nondistended.  Musculoskeletal: able to move extremities. No joint erythema or tenderness. Skin: Warm and dry. No rash noted. No erythema. Multiple healed lesions noted  on RLE. Left aka stump surgical wound 2x12cm with no signs of infection. Collagen dressing, dry dressing, and shrinker in place.   Neurological: Alert and oriented to person, place, and time. No focal deficits.  Psychiatric: Judgment and insight adequate. Appropriate mood and affect.   Labs Reviewed CBC Latest Ref Rng 04/02/2014 04/01/2014 03/31/2014  WBC 4.0 - 10.5 K/uL 5.8 6.1 5.6  Hemoglobin 13.0 - 17.0 g/dL 9.4(L) 9.5(L) 10.8(L)  Hematocrit 39.0 - 52.0 % 30.9(L) 31.4(L) 34.8(L)  Platelets 150 - 400 K/uL 319 350 342    CMP     Component Value Date/Time   NA 139 04/02/2014 0500   K 4.2 04/02/2014 0500   CL 100 04/02/2014 0500   CO2 28 04/02/2014 0500   GLUCOSE 83 04/02/2014 0500   BUN 8 04/02/2014 0500   CREATININE 2.87* 04/02/2014 0500   CALCIUM 8.5 04/02/2014 0500   PROT 5.8* 02/05/2014 0300   ALBUMIN 1.3* 02/09/2014 0500   AST 38* 02/05/2014 0300   ALT <5 02/05/2014 0300   ALKPHOS 86 02/05/2014 0300   BILITOT 0.5 02/05/2014 0300   GFRNONAA 20* 04/02/2014 0500   GFRAA 23* 04/02/2014 0500    Lab Results  Component Value Date   HGBA1C 5.9* 02/06/2014   Lab Results  Component Value Date   TSH 1.050 12/21/2013   Lipid Panel     Component Value Date/Time   CHOL 161 09/20/2011 0822   TRIG 121 09/20/2011 0822   HDL 34* 09/20/2011 0822   CHOLHDL 4.7 09/20/2011 0822   VLDL 24 09/20/2011 0822   LDLCALC 103* 09/20/2011 0822    Assessment & Plan 1. End  stage renal disease on dialysis Stable. Continue HD three times a week (Mon, Wed, Fri), 1200cc fluid restriction, and renal diet. Continue to f/u with nephrology for renal issues  2. Dyslipidemia No recent lipid panel. Continue lipitor 20mg  daily for now. Will check lipid panel and adjust dosage accordingly.   3. Non-healing amputation site Improving. Continue collagen dressing once daily with shrinker. Continue nutritional supports and monitor.   4. Peripheral neuropathy Stable. Continue gabapentin 200mg  daily, tylenol 650mg  Q6H PRN, and norco 10/325mg  q4H PRN. Continue to monitor.   5. Sacral ulcer, unstageable Improving. 1.8x1.2cm. No signs of infection. Continue Santyl daily. Continue pressure ulcer prophylaxis.   Labs Ordered: lipid panel  Family/Staff Communication Plan of care discuss with resident and professional staff members. Resident and professional staff members verbalize understanding and agree with plan of care. No additional questions or concerns reported.    Arthur Holms, MSN, AGNP-C [facility] [address] [place],  [address] [phone] [8am-5pm] After hours: [phone]

## 2014-04-30 LAB — LIPID PANEL
Cholesterol: 157 mg/dL (ref 0–200)
HDL: 39 mg/dL (ref 35–70)
LDL Cholesterol: 102 mg/dL
Triglycerides: 82 mg/dL (ref 40–160)

## 2014-05-03 ENCOUNTER — Emergency Department (HOSPITAL_COMMUNITY): Payer: Medicare Other

## 2014-05-03 ENCOUNTER — Inpatient Hospital Stay (HOSPITAL_COMMUNITY)
Admission: EM | Admit: 2014-05-03 | Discharge: 2014-05-08 | DRG: 393 | Disposition: A | Discharge status: Skilled Nursing Facility | Payer: Medicare Other | Attending: Internal Medicine | Admitting: Internal Medicine

## 2014-05-03 ENCOUNTER — Encounter (HOSPITAL_COMMUNITY): Payer: Self-pay | Admitting: *Deleted

## 2014-05-03 ENCOUNTER — Inpatient Hospital Stay (HOSPITAL_COMMUNITY): Payer: Medicare Other

## 2014-05-03 DIAGNOSIS — I7025 Atherosclerosis of native arteries of other extremities with ulceration: Secondary | ICD-10-CM | POA: Diagnosis present

## 2014-05-03 DIAGNOSIS — K219 Gastro-esophageal reflux disease without esophagitis: Secondary | ICD-10-CM | POA: Diagnosis present

## 2014-05-03 DIAGNOSIS — K8020 Calculus of gallbladder without cholecystitis without obstruction: Secondary | ICD-10-CM | POA: Diagnosis present

## 2014-05-03 DIAGNOSIS — Z8673 Personal history of transient ischemic attack (TIA), and cerebral infarction without residual deficits: Secondary | ICD-10-CM | POA: Diagnosis not present

## 2014-05-03 DIAGNOSIS — N186 End stage renal disease: Secondary | ICD-10-CM | POA: Diagnosis present

## 2014-05-03 DIAGNOSIS — R109 Unspecified abdominal pain: Secondary | ICD-10-CM | POA: Diagnosis present

## 2014-05-03 DIAGNOSIS — E440 Moderate protein-calorie malnutrition: Secondary | ICD-10-CM | POA: Diagnosis present

## 2014-05-03 DIAGNOSIS — Z992 Dependence on renal dialysis: Secondary | ICD-10-CM

## 2014-05-03 DIAGNOSIS — I953 Hypotension of hemodialysis: Secondary | ICD-10-CM | POA: Diagnosis present

## 2014-05-03 DIAGNOSIS — F0390 Unspecified dementia without behavioral disturbance: Secondary | ICD-10-CM | POA: Diagnosis present

## 2014-05-03 DIAGNOSIS — Z89612 Acquired absence of left leg above knee: Secondary | ICD-10-CM | POA: Diagnosis not present

## 2014-05-03 DIAGNOSIS — Z7982 Long term (current) use of aspirin: Secondary | ICD-10-CM | POA: Diagnosis not present

## 2014-05-03 DIAGNOSIS — I120 Hypertensive chronic kidney disease with stage 5 chronic kidney disease or end stage renal disease: Secondary | ICD-10-CM | POA: Diagnosis present

## 2014-05-03 DIAGNOSIS — L8990 Pressure ulcer of unspecified site, unspecified stage: Secondary | ICD-10-CM | POA: Diagnosis present

## 2014-05-03 DIAGNOSIS — E785 Hyperlipidemia, unspecified: Secondary | ICD-10-CM | POA: Diagnosis present

## 2014-05-03 DIAGNOSIS — I255 Ischemic cardiomyopathy: Secondary | ICD-10-CM | POA: Diagnosis present

## 2014-05-03 DIAGNOSIS — M1990 Unspecified osteoarthritis, unspecified site: Secondary | ICD-10-CM | POA: Diagnosis present

## 2014-05-03 DIAGNOSIS — D638 Anemia in other chronic diseases classified elsewhere: Secondary | ICD-10-CM | POA: Diagnosis present

## 2014-05-03 DIAGNOSIS — I447 Left bundle-branch block, unspecified: Secondary | ICD-10-CM | POA: Diagnosis present

## 2014-05-03 DIAGNOSIS — K529 Noninfective gastroenteritis and colitis, unspecified: Secondary | ICD-10-CM

## 2014-05-03 DIAGNOSIS — Z87891 Personal history of nicotine dependence: Secondary | ICD-10-CM | POA: Diagnosis not present

## 2014-05-03 DIAGNOSIS — I2510 Atherosclerotic heart disease of native coronary artery without angina pectoris: Secondary | ICD-10-CM | POA: Diagnosis present

## 2014-05-03 DIAGNOSIS — K5660 Unspecified intestinal obstruction: Secondary | ICD-10-CM | POA: Diagnosis present

## 2014-05-03 DIAGNOSIS — E119 Type 2 diabetes mellitus without complications: Secondary | ICD-10-CM | POA: Diagnosis present

## 2014-05-03 DIAGNOSIS — I252 Old myocardial infarction: Secondary | ICD-10-CM

## 2014-05-03 DIAGNOSIS — L98499 Non-pressure chronic ulcer of skin of other sites with unspecified severity: Secondary | ICD-10-CM

## 2014-05-03 DIAGNOSIS — I959 Hypotension, unspecified: Secondary | ICD-10-CM

## 2014-05-03 DIAGNOSIS — E089 Diabetes mellitus due to underlying condition without complications: Secondary | ICD-10-CM | POA: Diagnosis present

## 2014-05-03 DIAGNOSIS — Z9049 Acquired absence of other specified parts of digestive tract: Secondary | ICD-10-CM | POA: Diagnosis present

## 2014-05-03 DIAGNOSIS — Z89619 Acquired absence of unspecified leg above knee: Secondary | ICD-10-CM

## 2014-05-03 DIAGNOSIS — Z888 Allergy status to other drugs, medicaments and biological substances status: Secondary | ICD-10-CM | POA: Diagnosis not present

## 2014-05-03 DIAGNOSIS — F22 Delusional disorders: Secondary | ICD-10-CM | POA: Diagnosis present

## 2014-05-03 DIAGNOSIS — H9190 Unspecified hearing loss, unspecified ear: Secondary | ICD-10-CM | POA: Diagnosis present

## 2014-05-03 DIAGNOSIS — I739 Peripheral vascular disease, unspecified: Secondary | ICD-10-CM

## 2014-05-03 DIAGNOSIS — G43909 Migraine, unspecified, not intractable, without status migrainosus: Secondary | ICD-10-CM | POA: Diagnosis present

## 2014-05-03 DIAGNOSIS — I70209 Unspecified atherosclerosis of native arteries of extremities, unspecified extremity: Secondary | ICD-10-CM | POA: Insufficient documentation

## 2014-05-03 DIAGNOSIS — K559 Vascular disorder of intestine, unspecified: Secondary | ICD-10-CM | POA: Diagnosis present

## 2014-05-03 DIAGNOSIS — Z4659 Encounter for fitting and adjustment of other gastrointestinal appliance and device: Secondary | ICD-10-CM

## 2014-05-03 DIAGNOSIS — I950 Idiopathic hypotension: Secondary | ICD-10-CM

## 2014-05-03 HISTORY — DX: Disorder of kidney and ureter, unspecified: N28.9

## 2014-05-03 LAB — COMPREHENSIVE METABOLIC PANEL
ALBUMIN: 2.5 g/dL — AB (ref 3.5–5.2)
ALK PHOS: 109 U/L (ref 39–117)
ALT: 15 U/L (ref 0–53)
AST: 76 U/L — ABNORMAL HIGH (ref 0–37)
Anion gap: 17 — ABNORMAL HIGH (ref 5–15)
BILIRUBIN TOTAL: 0.5 mg/dL (ref 0.3–1.2)
BUN: 28 mg/dL — ABNORMAL HIGH (ref 6–23)
CHLORIDE: 95 meq/L — AB (ref 96–112)
CO2: 25 mEq/L (ref 19–32)
Calcium: 9.3 mg/dL (ref 8.4–10.5)
Creatinine, Ser: 3.83 mg/dL — ABNORMAL HIGH (ref 0.50–1.35)
GFR calc Af Amer: 16 mL/min — ABNORMAL LOW (ref >90)
GFR calc non Af Amer: 14 mL/min — ABNORMAL LOW (ref >90)
GLUCOSE: 171 mg/dL — AB (ref 70–99)
POTASSIUM: 3.9 meq/L (ref 3.7–5.3)
SODIUM: 137 meq/L (ref 137–147)
TOTAL PROTEIN: 7.9 g/dL (ref 6.0–8.3)

## 2014-05-03 LAB — CBC WITH DIFFERENTIAL/PLATELET
Basophils Absolute: 0 10*3/uL (ref 0.0–0.1)
Basophils Relative: 0 % (ref 0–1)
EOS ABS: 0.1 10*3/uL (ref 0.0–0.7)
Eosinophils Relative: 1 % (ref 0–5)
HCT: 38.1 % — ABNORMAL LOW (ref 39.0–52.0)
HEMOGLOBIN: 11.4 g/dL — AB (ref 13.0–17.0)
LYMPHS ABS: 1.1 10*3/uL (ref 0.7–4.0)
Lymphocytes Relative: 6 % — ABNORMAL LOW (ref 12–46)
MCH: 25.8 pg — AB (ref 26.0–34.0)
MCHC: 29.9 g/dL — ABNORMAL LOW (ref 30.0–36.0)
MCV: 86.2 fL (ref 78.0–100.0)
Monocytes Absolute: 1.7 10*3/uL — ABNORMAL HIGH (ref 0.1–1.0)
Monocytes Relative: 10 % (ref 3–12)
NEUTROS PCT: 83 % — AB (ref 43–77)
Neutro Abs: 15 10*3/uL — ABNORMAL HIGH (ref 1.7–7.7)
PLATELETS: 270 10*3/uL (ref 150–400)
RBC: 4.42 MIL/uL (ref 4.22–5.81)
RDW: 19.1 % — ABNORMAL HIGH (ref 11.5–15.5)
WBC: 18 10*3/uL — AB (ref 4.0–10.5)

## 2014-05-03 LAB — I-STAT CG4 LACTIC ACID, ED
LACTIC ACID, VENOUS: 2.54 mmol/L — AB (ref 0.5–2.2)
LACTIC ACID, VENOUS: 2.13 mmol/L (ref 0.5–2.2)

## 2014-05-03 LAB — GLUCOSE, CAPILLARY
GLUCOSE-CAPILLARY: 96 mg/dL (ref 70–99)
Glucose-Capillary: 104 mg/dL — ABNORMAL HIGH (ref 70–99)

## 2014-05-03 LAB — MRSA PCR SCREENING: MRSA by PCR: NEGATIVE

## 2014-05-03 LAB — LIPASE, BLOOD: Lipase: 25 U/L (ref 11–59)

## 2014-05-03 MED ORDER — METRONIDAZOLE IN NACL 5-0.79 MG/ML-% IV SOLN
500.0000 mg | Freq: Three times a day (TID) | INTRAVENOUS | Status: AC
  Administered 2014-05-03 – 2014-05-06 (×9): 500 mg via INTRAVENOUS
  Filled 2014-05-03 (×12): qty 100

## 2014-05-03 MED ORDER — SODIUM CHLORIDE 0.9 % IV BOLUS (SEPSIS)
250.0000 mL | Freq: Once | INTRAVENOUS | Status: AC
  Administered 2014-05-03: 250 mL via INTRAVENOUS

## 2014-05-03 MED ORDER — MIDODRINE HCL 5 MG PO TABS: 10.0000 mg | ORAL_TABLET | Freq: Three times a day (TID) | ORAL | Status: DC

## 2014-05-03 MED ORDER — INSULIN ASPART 100 UNIT/ML ~~LOC~~ SOLN: 0.0000 [IU] | Freq: Three times a day (TID) | SUBCUTANEOUS | Status: DC

## 2014-05-03 MED ORDER — ACETAMINOPHEN 325 MG PO TABS
650.0000 mg | ORAL_TABLET | Freq: Four times a day (QID) | ORAL | Status: DC | PRN
  Administered 2014-05-08: 650 mg via ORAL

## 2014-05-03 MED ORDER — CIPROFLOXACIN IN D5W 400 MG/200ML IV SOLN
400.0000 mg | Freq: Once | INTRAVENOUS | Status: AC
  Administered 2014-05-03: 400 mg via INTRAVENOUS
  Filled 2014-05-03: qty 200

## 2014-05-03 MED ORDER — ATORVASTATIN CALCIUM 20 MG PO TABS
20.0000 mg | ORAL_TABLET | Freq: Every day | ORAL | Status: DC
  Administered 2014-05-03 – 2014-05-07 (×4): 20 mg via ORAL
  Filled 2014-05-03 (×6): qty 1

## 2014-05-03 MED ORDER — ASPIRIN EC 81 MG PO TBEC
81.0000 mg | DELAYED_RELEASE_TABLET | Freq: Every day | ORAL | Status: DC
  Administered 2014-05-04 – 2014-05-08 (×5): 81 mg via ORAL
  Filled 2014-05-03 (×6): qty 1

## 2014-05-03 MED ORDER — ENOXAPARIN SODIUM 30 MG/0.3ML ~~LOC~~ SOLN
30.0000 mg | SUBCUTANEOUS | Status: DC
Start: 2014-05-03 — End: 2014-05-04
  Administered 2014-05-03: 30 mg via SUBCUTANEOUS
  Filled 2014-05-03 (×2): qty 0.3

## 2014-05-03 MED ORDER — MIDODRINE HCL 5 MG PO TABS
10.0000 mg | ORAL_TABLET | Freq: Three times a day (TID) | ORAL | Status: DC
  Administered 2014-05-03 – 2014-05-08 (×12): 10 mg via ORAL
  Filled 2014-05-03 (×18): qty 2

## 2014-05-03 MED ORDER — IOHEXOL 300 MG/ML  SOLN
100.0000 mL | Freq: Once | INTRAMUSCULAR | Status: AC | PRN
  Administered 2014-05-03: 100 mL via INTRAVENOUS

## 2014-05-03 MED ORDER — CIPROFLOXACIN IN D5W 400 MG/200ML IV SOLN
400.0000 mg | INTRAVENOUS | Status: AC
  Administered 2014-05-04 – 2014-05-05 (×2): 400 mg via INTRAVENOUS
  Filled 2014-05-03 (×3): qty 200

## 2014-05-03 MED ORDER — ALBUTEROL SULFATE (2.5 MG/3ML) 0.083% IN NEBU: 2.5000 mg | INHALATION_SOLUTION | Freq: Four times a day (QID) | RESPIRATORY_TRACT | Status: DC | PRN

## 2014-05-03 MED ORDER — SODIUM CHLORIDE 0.9 % IV BOLUS (SEPSIS)
500.0000 mL | Freq: Once | INTRAVENOUS | Status: AC
  Administered 2014-05-03: 500 mL via INTRAVENOUS

## 2014-05-03 MED ORDER — GABAPENTIN 100 MG PO CAPS
100.0000 mg | ORAL_CAPSULE | Freq: Every day | ORAL | Status: DC
  Administered 2014-05-03 – 2014-05-07 (×4): 100 mg via ORAL
  Filled 2014-05-03 (×6): qty 1

## 2014-05-03 MED ORDER — ONDANSETRON HCL 4 MG PO TABS: 4.0000 mg | ORAL_TABLET | Freq: Four times a day (QID) | ORAL | Status: DC | PRN

## 2014-05-03 MED ORDER — ONDANSETRON HCL 4 MG/2ML IJ SOLN: 4.0000 mg | Freq: Four times a day (QID) | INTRAMUSCULAR | Status: DC | PRN

## 2014-05-03 MED ORDER — METRONIDAZOLE IN NACL 5-0.79 MG/ML-% IV SOLN: 500.0000 mg | Freq: Three times a day (TID) | INTRAVENOUS | Status: DC

## 2014-05-03 MED ORDER — METRONIDAZOLE IN NACL 5-0.79 MG/ML-% IV SOLN
500.0000 mg | Freq: Once | INTRAVENOUS | Status: AC
  Administered 2014-05-03: 500 mg via INTRAVENOUS
  Filled 2014-05-03: qty 100

## 2014-05-03 NOTE — ED Notes (Signed)
Admitting md at bedside. Notified pts son of his admission per pts request.

## 2014-05-03 NOTE — ED Notes (Signed)
Pt arrived by [facility] from dialysis, access still in place left arm. Pt was hypotensive on arrival to ed. Reports being hard of hearing x 3 days which is abnormal for him. No changes in mental status. Also having right side lower abd pain for several days.

## 2014-05-03 NOTE — Consult Note (Deleted)
Called by EDP for patient with cardiac arrest total down time of 30 minutes.  Presented in the patient's room, chart reviewed thoroughly, ETT is size 6.  RT having difficulty ventilating patient through it.  Decision made to change ETT.  ETT changed to size 7.5 over a bougie without difficulty.  Shortly after change of the tube the patient lost pulse.  Coded patient again for an additional 10 minutes.  CXR result called with a PTX on the left.  Multiple broken ribs palpated.  Needle decompression done anteriorily.  During compression I was unable to secure the catheter however.  Then placed again on the lateral aspect of the chest since compressions were compromising placement.  EDP arrived.  Pulse check done.  Patient was asystolic.  Based on duration of arrest and the extensive medical history decision was made by myself and the EDP to stop resuscitation efforts.  Patient declared dead.  Prior to that CCM NP spoke with the family who asked that we discontinue resuscitation efforts and team was informed the same time decision was made to stop.  I spoke with the family, answered all questions and offered the condolences of the entire team.  My CC time 35 min independent of extender and procedure time.  Rush Farmer, M.D. [facility] Pulmonary/Critical Care Medicine. Pager: [phone]. After hours pager: [phone].

## 2014-05-03 NOTE — H&P (Signed)
[facility] History and Physical  DAMARKO STITELY XJO:[id number] DOB: Aug 17, 1935 DOA: 05/03/2014  Referring physician: EDP PCP: No primary care provider on file.   Chief Complaint: abd pain, low BP in HD  HPI: Sean [name] is a 78 y.o. male with PMH of ESRD, PVD s/p L AKA, DM, Dementia, h/o ischemic colitis s/p colon resection in 2011, resident of SNF was at HD today and started having abd pain and sent to the ER. Unclear how much HD was completed or when this was started. Upon arrival to ER, noted to be hypotensive in 70s, PCCM called and advised to give IVF, subsequently BP still soft in 80s, EDP d/w Dr.Yacoub with PCCM again who recommended admission to SDU per [facility]. Pt is a poor historian and only reports R sided abd pain starting today. No N/V, no diarrhea, no fevers or chills  Review of Systems: positives bolded Constitutional:  No weight loss, night sweats, Fevers, chills, fatigue.  HEENT:  No headaches, Difficulty swallowing,Tooth/dental problems,Sore throat,  No sneezing, itching, ear ache, nasal congestion, post nasal drip,  Cardio-vascular:  No chest pain, Orthopnea, PND, swelling in lower extremities, anasarca, dizziness, palpitations  GI:  No heartburn, indigestion, abdominal pain, nausea, vomiting, diarrhea, change in bowel habits, loss of appetite  Resp:  No shortness of breath with exertion or at rest. No excess mucus, no productive cough, No non-productive cough, No coughing up of blood.No change in color of mucus.No wheezing.No chest wall deformity  Skin:  no rash or lesions.  GU:  no dysuria, change in color of urine, no urgency or frequency. No flank pain.  Musculoskeletal:  No joint pain or swelling. No decreased range of motion. No back pain.  Psych:  No change in mood or affect. No depression or anxiety. No memory loss.   Past Medical History  Diagnosis Date  . Hyperparathyroidism   . Arthritis   . Hyperlipidemia   . Hypertension   .  Neuromuscular disorder   . Stroke   . GERD (gastroesophageal reflux disease)   . Migraine   . Heart attack 2011; 1990's  . Peripheral neuropathy   . Anxiety   . Depression   . Dialysis patient     M, W, F; [place] CAD (coronary artery disease)     a. s/p NSTEMI and failed RCA PCI in 2011. b. Nuc 12/2013: nonischemic, prior inferior MI.  Marland Kitchen LBBB (left bundle branch block)   . Calciphylaxis     a. Multiple admissions for nonhealing L ulcer/sepsis, s/p L AKA 02/02/2014.  Marland Kitchen Chronic anemia   . Dementia   . Type II diabetes mellitus     not taking any medications.  . ESRD (end stage renal disease) on dialysis   . Renal insufficiency    Past Surgical History  Procedure Laterality Date  . Colon surgery  2011    colon resection   . Parathyroidectomy  2011  . Capd insertion  09/12/2011    Procedure: LAPAROSCOPIC INSERTION CONTINUOUS AMBULATORY PERITONEAL DIALYSIS  (CAPD) CATHETER;  Surgeon: Edward Jolly, MD;  Location: [place];  Service: General;  Laterality: N/A;  . Sp av dialysis shunt access existing *l*  2003  . Dg av dialysis shunt intro ndl*r* or      "not working" (09/19/11)  . Capd removal  03/12/2012    Procedure: CONTINUOUS AMBULATORY PERITONEAL DIALYSIS  (CAPD) CATHETER REMOVAL;  Surgeon: Edward Jolly, MD;  Location: [place];  Service: General;  Laterality: N/A;  .  Amputation Left 02/02/2014    Procedure: AMPUTATION ABOVE KNEE-LEFT;  Surgeon: Elam Dutch, MD;  Location: [place];  Service: Vascular;  Laterality: Left;  . I&d extremity Left 03/31/2014    Procedure: IRRIGATION AND DEBRIDEMENT EXTREMITY - LEFT ABOVE KNEE AMPUTATION;  Surgeon: Elam Dutch, MD;  Location: [place];  Service: Vascular;  Laterality: Left;  . Application of wound vac Left 03/31/2014    Procedure: APPLICATION OF WOUND VAC - LEFT AKA;  Surgeon: Elam Dutch, MD;  Location: [place];  Service: Vascular;  Laterality: Left;   Social History:  reports that he quit smoking about 37  years ago. His smoking use included Cigarettes. He has a 1.75 pack-year smoking history. He has never used smokeless tobacco. He reports that he does not drink alcohol or use illicit drugs.  Allergies  Allergen Reactions  . Benazepril Hcl Hives, Itching and Rash    flareups on patient head    Family History  Problem Relation Age of Onset  . Cancer Sister     breast  . Cancer Brother     lung  . Anesthesia problems Neg Hx   . Hypotension Neg Hx   . Malignant hyperthermia Neg Hx   . Pseudochol deficiency Neg Hx      Prior to Admission medications   Medication Sig Start Date End Date Taking? Authorizing Provider  acetaminophen (TYLENOL) 325 MG tablet Take 2 tablets (650 mg total) by mouth every 6 (six) hours as needed for mild pain, fever or headache. 12/24/13  Yes Debbe Odea, MD  albuterol (PROVENTIL) (2.5 MG/3ML) 0.083% nebulizer solution Take 3 mLs (2.5 mg total) by nebulization every 6 (six) hours as needed for wheezing or shortness of breath. 01/21/14  Yes Ripudeep Krystal Eaton, MD  aspirin EC 81 MG tablet Take 81 mg by mouth daily.   Yes Historical Provider, MD  atorvastatin (LIPITOR) 20 MG tablet Take 20 mg by mouth at bedtime.   Yes Historical Provider, MD  b complex-vitamin c-folic acid (NEPHRO-VITE) 0.8 MG TABS Take 0.8 mg by mouth at bedtime.   Yes Historical Provider, MD  gabapentin (NEURONTIN) 100 MG capsule Take 100 mg by mouth at bedtime.    Yes Historical Provider, MD  HYDROcodone-acetaminophen (NORCO/VICODIN) 5-325 MG per tablet Take 1 tablet by mouth every 6 (six) hours as needed for moderate pain.   Yes Historical Provider, MD  feeding supplement, RESOURCE BREEZE, (RESOURCE BREEZE) LIQD Take 1 Container by mouth 2 (two) times daily between meals. Patient not taking: Reported on 05/03/2014 01/21/14   Ripudeep Krystal Eaton, MD  HYDROcodone-acetaminophen (NORCO) 10-325 MG per tablet Take 1 tablet by mouth every 4 (four) hours as needed. Patient not taking: Reported on 05/03/2014  04/07/14   Gerlene Fee, NP   Physical Exam: Filed Vitals:   05/03/14 1245 05/03/14 1319 05/03/14 1331 05/03/14 1447  BP: 76/49 83/47 82/46  87/44  Pulse: 85 89 88   Temp:      TempSrc:      Resp: 15 17 18 22   SpO2: 94% 96% 96%     Wt Readings from Last 3 Encounters:  04/28/14 74.753 kg (164 lb 12.8 oz)  04/23/14 74.39 kg (164 lb)  04/07/14 74.39 kg (164 lb)    General:  Chronically ill male, no distress, alert, awake, oriented to self, place Eyes: PERRL, normal lids, irises & conjunctiva ENT: grossly normal  lips & tongue Neck: no LAD, masses or thyromegaly Cardiovascular: RRR, no m/r/g. No LE edema. Telemetry: SR, no  arrhythmias  Respiratory: CTA bilaterally, no w/r/r. Normal respiratory effort. Abdomen: soft, RLQ tenderness with mild rigidity and guarding, BS diminished Skin: no rash or induration seen on limited exam Musculoskeletal: L AKA, RLE with multiple hypopigmented areas Psychiatric: grossly normal mood and affect, speech fluent and appropriate Neurologic: grossly non-focal.          Labs on Admission:  Basic Metabolic Panel:  Recent Labs Lab 05/03/14 0805  NA 137  K 3.9  CL 95*  CO2 25  GLUCOSE 171*  BUN 28*  CREATININE 3.83*  CALCIUM 9.3   Liver Function Tests:  Recent Labs Lab 05/03/14 0805  AST 76*  ALT 15  ALKPHOS 109  BILITOT 0.5  PROT 7.9  ALBUMIN 2.5*    Recent Labs Lab 05/03/14 0805  LIPASE 25   No results for input(s): AMMONIA in the last 168 hours. CBC:  Recent Labs Lab 05/03/14 0805  WBC 18.0*  NEUTROABS 15.0*  HGB 11.4*  HCT 38.1*  MCV 86.2  PLT 270   Cardiac Enzymes: No results for input(s): CKTOTAL, CKMB, CKMBINDEX, TROPONINI in the last 168 hours.  BNP (last 3 results)  Recent Labs  12/13/13 1408 01/17/14 0800  PROBNP >70000.0* >70000.0*   CBG: No results for input(s): GLUCAP in the last 168 hours.  Radiological Exams on Admission: Ct Abdomen Pelvis W Contrast  05/03/2014   CLINICAL DATA:   Right lower quadrant pain for several days.  EXAM: CT ABDOMEN AND PELVIS WITH CONTRAST  TECHNIQUE: Multidetector CT imaging of the abdomen and pelvis was performed using the standard protocol following bolus administration of intravenous contrast.  CONTRAST:  166mL OMNIPAQUE IOHEXOL 300 MG/ML  SOLN  COMPARISON:  02/03/2014 and 12/09/2011  FINDINGS: Lung bases demonstrate subtle linear/heterogeneous density over the posterior bases likely atelectasis. There is moderate cardiomegaly with left main and 3 vessel atherosclerotic coronary artery disease.  Abdominal images demonstrate mild diffuse hepatic steatosis the spleen, pancreas and adrenal glands are within normal. Again noted is mild cholelithiasis. There is mild gallbladder wall thickening versus minimal pericholecystic fluid. Kidneys are somewhat small with numerous cysts compatible patient's medical renal disease/dialysis. There is a sub cm exophytic indeterminate left renal cortical nodule over the mid to lower pole unchanged from the recent prior exam. Multiple renal vascular calcifications are evident. Ureters are unremarkable. There is moderate calcified plaque of the abdominal aorta and iliac arteries.  There is evidence of patient's previous colon surgery involving the ascending colon near the cecum. There is wall thickening of this segment of colon adjacent the anastomosis. Minimal stranding of the adjacent pericolonic fat. The anastomosis is otherwise patent. There is minimal wall thickening of the small bowel loop immediately proximal to the anastomosis. There are several mildly prominent/borderline dilated small bowel loops which may be due to ileus versus partial obstructive effect at the anastomosis due to the inflamed colon.  Posterior changes of the midline anterior abdominal wall. Small incisional hernia near the umbilicus containing only peritoneal fat.  Pelvic images demonstrate a decompressed bladder. The prostate and rectum are within normal.  Bony structures demonstrate continued evidence of renal osteodystrophy. Degenerative changes of the hips and spine. Multiple Schmorl's nodes throughout the spine.  IMPRESSION: Wall thickening with mild adjacent inflammatory change involving the colon adjacent the anastomosis in the right lower quadrant compatible with acute colitis. Associated colon to small bowel anastomosis appears to be patent with minimal wall thickening of the small bowel immediately proximal to the anastomosis along with several borderline dilated more proximal small  bowel loops likely secondary to ileus versus partial obstructing effect at the anastomosis due to the inflamed colon.  Cholelithiasis with evidence of mild gallbladder wall thickening versus adjacent pericholecystic fluid. Right upper quadrant ultrasound may be helpful for further evaluation.  Somewhat small kidneys with numerous cysts unchanged. Indeterminate subcentimeter exophytic left renal nodule unchanged from the recent prior exam. Recommend CT in 6 months.  This recommendation follows ACR consensus guidelines: Managing Incidental Findings on Abdominal CT: White Paper of the ACR Incidental Findings Committee. J Am Coll Radiol 2010;7:754-773  Small incisional hernia adjacent the umbilicus containing only peritoneal fat.  Cardiomegaly with left main and 3 vessel atherosclerotic coronary artery disease. Mild bibasilar atelectasis.  Mild hepatic steatosis.   Electronically Signed   By: Marin Olp M.D.   On: 05/03/2014 11:04    EKG: Independently reviewed.   Assessment/Plan    Acute Colitis,  Likely ischemic -has multiple risk factors for vascular disease, and suspect drop in BP with HD caused bowel ischemia -NPO, except ice chips -Iv Cipro/flagyl -gentle IVf for 6hours    End stage renal disease on dialysis -on HD, did not get HD today as planned -Renal consulted per ER    History of cardiovascular disorder -continue ASA    Diabetes mellitus due to  underlying condition -SSI, not on meds at SNF    Malnutrition of moderate degree    S/P AKA (above knee amputation) unilateral    Indeterminate subcentimeter exophytic left renal nodule unchanged from the recent prior exam, needs FU CT in 6 months     Cholelithiasis with mild GB wall thickening -murphy's sign negative -h/o per chole drain in 8/15 for acute cholecystitis  Anemia of chronic disease -stable  Code Status: Full Code DVT Prophylaxis: lovenox Family Communication: no family at bedside Disposition Plan: admit to SDU  Time spent: 55min  Somnang Mahan [facility] Pager [phone]

## 2014-05-03 NOTE — ED Provider Notes (Signed)
CSN: [id number]     Arrival date & time 05/03/14  0756 History   None    Chief Complaint  Patient presents with  . Hypotension  . Abdominal Pain  . Hearing Problem     (Consider location/radiation/quality/duration/timing/severity/associated sxs/prior Treatment) HPI   [name] is a 78 year old male with a past medical history of end-stage renal disease on dialysis, calciphylaxis, coronary artery disease, previous CVA, type 2 diabetes and dementia sent over by the dialysis clinic for hypotension. The patient is hard of hearing and has a past history of dementia which makes history very difficult. Essentially, patient has pain in the right lower quadrant of his abdomen which he attributes to eating some something a few days ago. He states that he felt like he was going to throw up a couple times but did not and denies diarrhea or constipation. The patient was found to be hypotensive today at dialysis and was sent here for further evaluation. Patient states he did start his dialysis for a moment but stopped. He is otherwise unsure of why he is here. He states "I can't hear them very well."   Past Medical History  Diagnosis Date  . Hyperparathyroidism   . Arthritis   . Hyperlipidemia   . Hypertension   . Neuromuscular disorder   . Stroke   . GERD (gastroesophageal reflux disease)   . Migraine   . Heart attack 2011; 1990's  . Peripheral neuropathy   . Anxiety   . Depression   . Dialysis patient     M, W, F; [place] CAD (coronary artery disease)     a. s/p NSTEMI and failed RCA PCI in 2011. b. Nuc 12/2013: nonischemic, prior inferior MI.  Marland Kitchen LBBB (left bundle branch block)   . Calciphylaxis     a. Multiple admissions for nonhealing L ulcer/sepsis, s/p L AKA 02/02/2014.  Marland Kitchen Chronic anemia   . Dementia   . Type II diabetes mellitus     not taking any medications.  . ESRD (end stage renal disease) on dialysis    Past Surgical History  Procedure Laterality Date  .  Colon surgery  2011    colon resection   . Parathyroidectomy  2011  . Capd insertion  09/12/2011    Procedure: LAPAROSCOPIC INSERTION CONTINUOUS AMBULATORY PERITONEAL DIALYSIS  (CAPD) CATHETER;  Surgeon: Edward Jolly, MD;  Location: [place];  Service: General;  Laterality: N/A;  . Sp av dialysis shunt access existing *l*  2003  . Dg av dialysis shunt intro ndl*r* or      "not working" (09/19/11)  . Capd removal  03/12/2012    Procedure: CONTINUOUS AMBULATORY PERITONEAL DIALYSIS  (CAPD) CATHETER REMOVAL;  Surgeon: Edward Jolly, MD;  Location: [place];  Service: General;  Laterality: N/A;  . Amputation Left 02/02/2014    Procedure: AMPUTATION ABOVE KNEE-LEFT;  Surgeon: Elam Dutch, MD;  Location: [place];  Service: Vascular;  Laterality: Left;  . I&d extremity Left 03/31/2014    Procedure: IRRIGATION AND DEBRIDEMENT EXTREMITY - LEFT ABOVE KNEE AMPUTATION;  Surgeon: Elam Dutch, MD;  Location: [place];  Service: Vascular;  Laterality: Left;  . Application of wound vac Left 03/31/2014    Procedure: APPLICATION OF WOUND VAC - LEFT AKA;  Surgeon: Elam Dutch, MD;  Location: [facility] OR;  Service: Vascular;  Laterality: Left;   Family History  Problem Relation Age of Onset  . Cancer Sister     breast  . Cancer  Brother     lung  . Anesthesia problems Neg Hx   . Hypotension Neg Hx   . Malignant hyperthermia Neg Hx   . Pseudochol deficiency Neg Hx    History  Substance Use Topics  . Smoking status: Former Smoker -- 0.25 packs/day for 7 years    Types: Cigarettes    Quit date: 06/12/1976  . Smokeless tobacco: Never Used  . Alcohol Use: No    Review of Systems  Constitutional: Negative for fever.  HENT: Positive for hearing loss.   Respiratory: Negative for chest tightness and shortness of breath.   Cardiovascular: Negative for chest pain.  Gastrointestinal: Positive for nausea and abdominal pain. Negative for vomiting.  Musculoskeletal: Negative for myalgias.  All other  systems reviewed and are negative.     Allergies  Benazepril hcl  Home Medications   Prior to Admission medications   Medication Sig Start Date End Date Taking? Authorizing Provider  acetaminophen (TYLENOL) 325 MG tablet Take 2 tablets (650 mg total) by mouth every 6 (six) hours as needed for mild pain, fever or headache. 12/24/13   Debbe Odea, MD  albuterol (PROVENTIL) (2.5 MG/3ML) 0.083% nebulizer solution Take 3 mLs (2.5 mg total) by nebulization every 6 (six) hours as needed for wheezing or shortness of breath. 01/21/14   Ripudeep Krystal Eaton, MD  aspirin 325 MG tablet Take 325 mg by mouth every evening.    Historical Provider, MD  atorvastatin (LIPITOR) 20 MG tablet Take 20 mg by mouth at bedtime.    Historical Provider, MD  b complex-vitamin c-folic acid (NEPHRO-VITE) 0.8 MG TABS Take 0.8 mg by mouth at bedtime.    Historical Provider, MD  feeding supplement, RESOURCE BREEZE, (RESOURCE BREEZE) LIQD Take 1 Container by mouth 2 (two) times daily between meals. 01/21/14   Ripudeep Krystal Eaton, MD  gabapentin (NEURONTIN) 100 MG capsule Take 200 mg by mouth daily.    Historical Provider, MD  HYDROcodone-acetaminophen (NORCO) 10-325 MG per tablet Take 1 tablet by mouth every 4 (four) hours as needed. 04/07/14   Gerlene Fee, NP  ondansetron (ZOFRAN) 4 MG tablet Take 4 mg by mouth every 8 (eight) hours as needed for nausea or vomiting.    Historical Provider, MD  vitamin C (ASCORBIC ACID) 500 MG tablet Take 500 mg by mouth 2 (two) times daily.    Historical Provider, MD   BP 80/47 mmHg  Pulse 91  Temp(Src) 98.7 F (37.1 C) (Oral)  Resp 18  SpO2 94% Physical Exam  Constitutional: He appears well-developed and well-nourished. No distress.  HENT:  Head: Normocephalic and atraumatic.  Eyes: Conjunctivae are normal. No scleral icterus.  Neck: Normal range of motion. Neck supple.  Cardiovascular: Normal rate, regular rhythm and normal heart sounds.   Pulmonary/Chest: Effort normal and breath  sounds normal. No respiratory distress.  Abdominal: Soft. He exhibits no distension and no mass. There is tenderness. There is no rebound and no guarding.  TTP RLQ. No evidence of hernia  Musculoskeletal: He exhibits no edema.  Neurological: He is alert.  Skin: Skin is warm and dry. He is not diaphoretic.  Psychiatric: His behavior is normal.  Nursing note and vitals reviewed.   ED Course  Procedures (including critical care time) Labs Review Labs Reviewed  CBC WITH DIFFERENTIAL  COMPREHENSIVE METABOLIC PANEL  LIPASE, BLOOD  I-STAT CG4 LACTIC ACID, ED    Imaging Review No results found.   EKG Interpretation None      MDM   Final diagnoses:  Acute colitis  Hypotension, unspecified hypotension type  ESRD (end stage renal disease)         11:27 AM Filed Vitals:   05/03/14 0930 05/03/14 0951 05/03/14 1100 05/03/14 1111  BP: 84/54  76/51 78/46  Pulse: 85  91 88  Temp:  99.6 F (37.6 C)    TempSrc:  Rectal    Resp: 17  16 18   SpO2: 97%  91% 95%   Patient with acute colitis in the RLQ.  there is still evidence of cholecystitis after his recent perc drain.I have ordered cipro and flagyl per pharmacy.   I have spoken with Dr. Daleen Bo who asks for an intensivist consult.  Dr. Nelda Marseille requests large volume bolus, I have ordered another 500cc fluid. Recheck pressures. Review of the patient's previously documented vitals shows systolic pressure generally at 120.   Patient continues to have soft pressure despite fluid resus.  Dr Nelda Marseille asks for hospitalist admission to step down. Dr. Tana Coast will admit.\ Labs appear at baseline. Pt stable in ED with no significant deterioration in condition.     Margarita Mail, PA-C 05/08/14 [place], MD 05/11/14 [phone]

## 2014-05-03 NOTE — ED Notes (Signed)
Myself and Darci Current, RN assisted pt off the bedpan; pt did not have to have a bowel movement it was just gas

## 2014-05-03 NOTE — Consult Note (Signed)
Name: Sean [name] MRN: [id number] DOB: July 05, 1935    ADMISSION DATE:  05/03/2014 CONSULTATION DATE:  05/03/2014  REFERRING MD :  EDP  CHIEF COMPLAINT:  Hypotension.  BRIEF PATIENT DESCRIPTION: 78 year old male with history of ESRD on HD who presented to the hospital from a dialysis center when he was noted to be hypotensive.  Patient only complaints of abdominal pain.  In the ED, lactate was drawn that 2.5 and a CT of the abdomen revealed pan colitis.  The patient was fluid resuscitated with 250 ml of NS and when noted to have SBP of 85 PCCM was called to admit.  Advised to give more IVF and repeat Lactic acid.    PAST MEDICAL HISTORY :   has a past medical history of Hyperparathyroidism; Arthritis; Hyperlipidemia; Hypertension; Neuromuscular disorder; Stroke; GERD (gastroesophageal reflux disease); Migraine; Heart attack (2011; 1990's); Peripheral neuropathy; Anxiety; Depression; Dialysis patient; CAD (coronary artery disease); LBBB (left bundle branch block); Calciphylaxis; Chronic anemia; Dementia; Type II diabetes mellitus; ESRD (end stage renal disease) on dialysis; and Renal insufficiency.  has past surgical history that includes Colon surgery (2011); Parathyroidectomy (2011); CAPD insertion (09/12/2011); sp av dialysis shunt access existing *l* (2003); dg av dialysis shunt intro ndl*r* or; CAPD removal (03/12/2012); Amputation (Left, 02/02/2014); I&D extremity (Left, 94/49/6759); and Application if wound vac (Left, 03/31/2014). Prior to Admission medications   Medication Sig Start Date End Date Taking? Authorizing Provider  acetaminophen (TYLENOL) 325 MG tablet Take 2 tablets (650 mg total) by mouth every 6 (six) hours as needed for mild pain, fever or headache. 12/24/13  Yes Debbe Odea, MD  albuterol (PROVENTIL) (2.5 MG/3ML) 0.083% nebulizer solution Take 3 mLs (2.5 mg total) by nebulization every 6 (six) hours as needed for wheezing or shortness of breath. 01/21/14  Yes Ripudeep Krystal Eaton, MD  aspirin EC 81 MG tablet Take 81 mg by mouth daily.   Yes Historical Provider, MD  atorvastatin (LIPITOR) 20 MG tablet Take 20 mg by mouth at bedtime.   Yes Historical Provider, MD  b complex-vitamin c-folic acid (NEPHRO-VITE) 0.8 MG TABS Take 0.8 mg by mouth at bedtime.   Yes Historical Provider, MD  gabapentin (NEURONTIN) 100 MG capsule Take 100 mg by mouth at bedtime.    Yes Historical Provider, MD  HYDROcodone-acetaminophen (NORCO/VICODIN) 5-325 MG per tablet Take 1 tablet by mouth every 6 (six) hours as needed for moderate pain.   Yes Historical Provider, MD  feeding supplement, RESOURCE BREEZE, (RESOURCE BREEZE) LIQD Take 1 Container by mouth 2 (two) times daily between meals. Patient not taking: Reported on 05/03/2014 01/21/14   Ripudeep Krystal Eaton, MD  HYDROcodone-acetaminophen (NORCO) 10-325 MG per tablet Take 1 tablet by mouth every 4 (four) hours as needed. Patient not taking: Reported on 05/03/2014 04/07/14   Gerlene Fee, NP   Allergies  Allergen Reactions  . Benazepril Hcl Hives, Itching and Rash    flareups on patient head    FAMILY HISTORY:  family history includes Cancer in his brother and sister. There is no history of Anesthesia problems, Hypotension, Malignant hyperthermia, or Pseudochol deficiency. SOCIAL HISTORY:  reports that he quit smoking about 37 years ago. His smoking use included Cigarettes. He has a 1.75 pack-year smoking history. He has never used smokeless tobacco. He reports that he does not drink alcohol or use illicit drugs.  REVIEW OF SYSTEMS:   Positive only for abdominal pain and hearing loss, otherwise 12 point review of system is negative.  SUBJECTIVE:  VITAL SIGNS: Temp:  [98.7 F (37.1 C)-99.6 F (37.6 C)] 99.6 F (37.6 C) (11/22 0951) Pulse Rate:  [85-91] 88 (11/22 1331) Resp:  [14-22] 22 (11/22 1447) BP: (73-87)/(44-58) 87/44 mmHg (11/22 1447) SpO2:  [91 %-97 %] 96 % (11/22 1331)  PHYSICAL EXAMINATION: General:  Chronically ill  appearing male, NAD. Neuro:  Alert and interactive, moving all present ext to command. HEENT:  [place]/AT, PERRL, EOM-I and MMM. Cardiovascular:  RRR, Nl S1/S2, -M/R/G. Lungs:  CTA bilaterally. Abdomen:  Soft, pain at the RLQ, ND and +BS. Musculoskeletal:  - edema and -tenderness, S/P BKA. Skin:  Intact.   Recent Labs Lab 05/03/14 0805  NA 137  K 3.9  CL 95*  CO2 25  BUN 28*  CREATININE 3.83*  GLUCOSE 171*    Recent Labs Lab 05/03/14 0805  HGB 11.4*  HCT 38.1*  WBC 18.0*  PLT 270   Ct Abdomen Pelvis W Contrast  05/03/2014   CLINICAL DATA:  Right lower quadrant pain for several days.  EXAM: CT ABDOMEN AND PELVIS WITH CONTRAST  TECHNIQUE: Multidetector CT imaging of the abdomen and pelvis was performed using the standard protocol following bolus administration of intravenous contrast.  CONTRAST:  168mL OMNIPAQUE IOHEXOL 300 MG/ML  SOLN  COMPARISON:  02/03/2014 and 12/09/2011  FINDINGS: Lung bases demonstrate subtle linear/heterogeneous density over the posterior bases likely atelectasis. There is moderate cardiomegaly with left main and 3 vessel atherosclerotic coronary artery disease.  Abdominal images demonstrate mild diffuse hepatic steatosis the spleen, pancreas and adrenal glands are within normal. Again noted is mild cholelithiasis. There is mild gallbladder wall thickening versus minimal pericholecystic fluid. Kidneys are somewhat small with numerous cysts compatible patient's medical renal disease/dialysis. There is a sub cm exophytic indeterminate left renal cortical nodule over the mid to lower pole unchanged from the recent prior exam. Multiple renal vascular calcifications are evident. Ureters are unremarkable. There is moderate calcified plaque of the abdominal aorta and iliac arteries.  There is evidence of patient's previous colon surgery involving the ascending colon near the cecum. There is wall thickening of this segment of colon adjacent the anastomosis. Minimal stranding  of the adjacent pericolonic fat. The anastomosis is otherwise patent. There is minimal wall thickening of the small bowel loop immediately proximal to the anastomosis. There are several mildly prominent/borderline dilated small bowel loops which may be due to ileus versus partial obstructive effect at the anastomosis due to the inflamed colon.  Posterior changes of the midline anterior abdominal wall. Small incisional hernia near the umbilicus containing only peritoneal fat.  Pelvic images demonstrate a decompressed bladder. The prostate and rectum are within normal. Bony structures demonstrate continued evidence of renal osteodystrophy. Degenerative changes of the hips and spine. Multiple Schmorl's nodes throughout the spine.  IMPRESSION: Wall thickening with mild adjacent inflammatory change involving the colon adjacent the anastomosis in the right lower quadrant compatible with acute colitis. Associated colon to small bowel anastomosis appears to be patent with minimal wall thickening of the small bowel immediately proximal to the anastomosis along with several borderline dilated more proximal small bowel loops likely secondary to ileus versus partial obstructing effect at the anastomosis due to the inflamed colon.  Cholelithiasis with evidence of mild gallbladder wall thickening versus adjacent pericholecystic fluid. Right upper quadrant ultrasound may be helpful for further evaluation.  Somewhat small kidneys with numerous cysts unchanged. Indeterminate subcentimeter exophytic left renal nodule unchanged from the recent prior exam. Recommend CT in 6 months.  This recommendation follows ACR consensus  guidelines: Managing Incidental Findings on Abdominal CT: White Paper of the ACR Incidental Findings Committee. J Am Coll Radiol 2010;7:754-773  Small incisional hernia adjacent the umbilicus containing only peritoneal fat.  Cardiomegaly with left main and 3 vessel atherosclerotic coronary artery disease. Mild  bibasilar atelectasis.  Mild hepatic steatosis.   Electronically Signed   By: Marin Olp M.D.   On: 05/03/2014 11:04    ASSESSMENT / PLAN:  78 year old male with PMH of ESRD-HD, he now has pan colitis and is "hypotensive" with lactic acid level of 2.5.  He is comfortable, sitting in bed flipping TV channels.  He has not been adequately fluid resuscitated with 250 ml of NS.  Recommendations: - Abx for colitis - likely cipro/flagyl. - Adequate fluid resuscitation. - Recommend checking cortisol level. - After cortisone is drawn give stress dose steroids. - Recommend midodrine if the BP drops AND patient becomes asymptomatic as frequently used in HD patient. - Repeat lactate, if rising then please call PCCM and we will assume care.  In the meantime, PCCM will sign off, please call back if needed.  Rush Farmer, M.D. [facility] Pulmonary/Critical Care Medicine. Pager: [phone]. After hours pager: [phone].  05/03/2014, 3:23 PM

## 2014-05-03 NOTE — ED Notes (Signed)
Iv team at bedside to deaccess fistula left arm

## 2014-05-03 NOTE — Progress Notes (Signed)
Notified Dr. Domenic Polite patients BP decreased most recent 61/45.  Received an order for a 587ml bolus of NS.  Additional order for Midodrine placed asked for clarification on timing due to NGT order.  Directed to give the Midodrine and wait thirty minutes then place the NGT.  Will continue to monitor patient.

## 2014-05-03 NOTE — Progress Notes (Signed)
ANTIBIOTIC CONSULT NOTE - INITIAL  Pharmacy Consult for Cipro Indication: intra-abdominal infection  Allergies  Allergen Reactions  . Benazepril Hcl Hives, Itching and Rash    flareups on patient head    Patient Measurements:   Adjusted Body Weight:   Vital Signs: Temp: 99.6 F (37.6 C) (11/22 0951) Temp Source: Rectal (11/22 0951) BP: 83/47 mmHg (11/22 1319) Pulse Rate: 89 (11/22 1319) Intake/Output from previous day:   Intake/Output from this shift: Total I/O In: 250 [I.V.:250] Out: -   Labs:  Recent Labs  05/03/14 0805  WBC 18.0*  HGB 11.4*  PLT 270  CREATININE 3.83*   Estimated Creatinine Clearance: 15.5 mL/min (by C-G formula based on Cr of 3.83). No results for input(s): VANCOTROUGH, VANCOPEAK, VANCORANDOM, GENTTROUGH, GENTPEAK, GENTRANDOM, TOBRATROUGH, TOBRAPEAK, TOBRARND, AMIKACINPEAK, AMIKACINTROU, AMIKACIN in the last 72 hours.   Microbiology: No results found for this or any previous visit (from the past 720 hour(s)).  Medical History: Past Medical History  Diagnosis Date  . Hyperparathyroidism   . Arthritis   . Hyperlipidemia   . Hypertension   . Neuromuscular disorder   . Stroke   . GERD (gastroesophageal reflux disease)   . Migraine   . Heart attack 2011; 1990's  . Peripheral neuropathy   . Anxiety   . Depression   . Dialysis patient     M, W, F; [place] CAD (coronary artery disease)     a. s/p NSTEMI and failed RCA PCI in 2011. b. Nuc 12/2013: nonischemic, prior inferior MI.  Marland Kitchen LBBB (left bundle branch block)   . Calciphylaxis     a. Multiple admissions for nonhealing L ulcer/sepsis, s/p L AKA 02/02/2014.  Marland Kitchen Chronic anemia   . Dementia   . Type II diabetes mellitus     not taking any medications.  . ESRD (end stage renal disease) on dialysis   . Renal insufficiency     Medications:  Scheduled:  Assessment: 78yo male ESRD-MWF HD, presenting from HD center today with hypotension and abdominal pain- to start Cipro.   WBC 18.  No cx data at this time.  Goal of Therapy:  Resolution of infxn  Plan:  Cipro 400mg  IV q24  Gracy Bruins, PharmD Clinical Pharmacist [facility]

## 2014-05-03 NOTE — ED Notes (Addendum)
Myself and Darci Current, RN placed pt on bedpan to use

## 2014-05-03 NOTE — ED Notes (Signed)
Patient transported to CT 

## 2014-05-03 NOTE — Progress Notes (Signed)
Dialysis needles deaccessed per order. Pressure held x 25 minutes. No active bleeding noted upon completion. 4x4 and paper tape applied. Positive thrill noted.

## 2014-05-03 NOTE — ED Notes (Signed)
Pt undressed, in gown, on monitor, continuous pulse oximetry and blood pressure cuff 

## 2014-05-03 NOTE — Consult Note (Signed)
Reason for Consult:ischemic colitis Referring Physician: Dr Orlean Patten is an 78 y.o. male.  HPI: 78yo AAM complex past medical history of ESRD HD MWF, CAD s/p MI 2011 and 7/15, HTN, ischemic CMP with EF 25%, sCHF, DM2, hyperparathyroidism, recent hospitalization 7/11-7/15 and again 8/8-8/12 with LLE pain calciphylaxis and infection of the wound and Oct with non-healing L AKA who was brought to ED from HD for hypoTN. He also c/o several day history of Right sided pain. Reports some n/v. States last BM yesterday. Pt is very poor historian and difficult to obtain information from. Elevated WBC in ED. Lactate initially elevated but trended down on repeat. CT shows distal SB/prox colon colitis probably due to ischemia.   Pt has had prior colon resection on Rt from similar issue. No family at beside  Past Medical History  Diagnosis Date  . Hyperparathyroidism   . Arthritis   . Hyperlipidemia   . Hypertension   . Neuromuscular disorder   . Stroke   . GERD (gastroesophageal reflux disease)   . Migraine   . Heart attack 2011; 1990's  . Peripheral neuropathy   . Anxiety   . Depression   . Dialysis patient     M, W, F; [place] CAD (coronary artery disease)     a. s/p NSTEMI and failed RCA PCI in 2011. b. Nuc 12/2013: nonischemic, prior inferior MI.  Marland Kitchen LBBB (left bundle branch block)   . Calciphylaxis     a. Multiple admissions for nonhealing L ulcer/sepsis, s/p L AKA 02/02/2014.  Marland Kitchen Chronic anemia   . Dementia   . Type II diabetes mellitus     not taking any medications.  . ESRD (end stage renal disease) on dialysis   . Renal insufficiency     Past Surgical History  Procedure Laterality Date  . Colon surgery  2011    colon resection   . Parathyroidectomy  2011  . Capd insertion  09/12/2011    Procedure: LAPAROSCOPIC INSERTION CONTINUOUS AMBULATORY PERITONEAL DIALYSIS  (CAPD) CATHETER;  Surgeon: Edward Jolly, MD;  Location: [place];  Service: General;   Laterality: N/A;  . Sp av dialysis shunt access existing *l*  2003  . Dg av dialysis shunt intro ndl*r* or      "not working" (09/19/11)  . Capd removal  03/12/2012    Procedure: CONTINUOUS AMBULATORY PERITONEAL DIALYSIS  (CAPD) CATHETER REMOVAL;  Surgeon: Edward Jolly, MD;  Location: [place];  Service: General;  Laterality: N/A;  . Amputation Left 02/02/2014    Procedure: AMPUTATION ABOVE KNEE-LEFT;  Surgeon: Elam Dutch, MD;  Location: [place];  Service: Vascular;  Laterality: Left;  . I&d extremity Left 03/31/2014    Procedure: IRRIGATION AND DEBRIDEMENT EXTREMITY - LEFT ABOVE KNEE AMPUTATION;  Surgeon: Elam Dutch, MD;  Location: [place];  Service: Vascular;  Laterality: Left;  . Application of wound vac Left 03/31/2014    Procedure: APPLICATION OF WOUND VAC - LEFT AKA;  Surgeon: Elam Dutch, MD;  Location[facility];  Service: Vascular;  Laterality: Left;    Family History  Problem Relation Age of Onset  . Cancer Sister     breast  . Cancer Brother     lung  . Anesthesia problems Neg Hx   . Hypotension Neg Hx   . Malignant hyperthermia Neg Hx   . Pseudochol deficiency Neg Hx     Social History:  reports that he quit smoking about 37 years  ago. His smoking use included Cigarettes. He has a 1.75 pack-year smoking history. He has never used smokeless tobacco. He reports that he does not drink alcohol or use illicit drugs.  Allergies:  Allergies  Allergen Reactions  . Benazepril Hcl Hives, Itching and Rash    flareups on patient head    Medications: I have reviewed the patient's current medications.  Results for orders placed or performed during the hospital encounter of 05/03/14 (from the past 48 hour(s))  CBC with Differential     Status: Abnormal   Collection Time: 05/03/14  8:05 AM  Result Value Ref Range   WBC 18.0 (H) 4.0 - 10.5 K/uL   RBC 4.42 4.22 - 5.81 MIL/uL   Hemoglobin 11.4 (L) 13.0 - 17.0 g/dL   HCT 38.1 (L) 39.0 - 52.0 %   MCV 86.2 78.0 - 100.0 fL     MCH 25.8 (L) 26.0 - 34.0 pg   MCHC 29.9 (L) 30.0 - 36.0 g/dL   RDW 19.1 (H) 11.5 - 15.5 %   Platelets 270 150 - 400 K/uL   Neutrophils Relative % 83 (H) 43 - 77 %   Neutro Abs 15.0 (H) 1.7 - 7.7 K/uL   Lymphocytes Relative 6 (L) 12 - 46 %   Lymphs Abs 1.1 0.7 - 4.0 K/uL   Monocytes Relative 10 3 - 12 %   Monocytes Absolute 1.7 (H) 0.1 - 1.0 K/uL   Eosinophils Relative 1 0 - 5 %   Eosinophils Absolute 0.1 0.0 - 0.7 K/uL   Basophils Relative 0 0 - 1 %   Basophils Absolute 0.0 0.0 - 0.1 K/uL  Comprehensive metabolic panel     Status: Abnormal   Collection Time: 05/03/14  8:05 AM  Result Value Ref Range   Sodium 137 137 - 147 mEq/L   Potassium 3.9 3.7 - 5.3 mEq/L   Chloride 95 (L) 96 - 112 mEq/L   CO2 25 19 - 32 mEq/L   Glucose, Bld 171 (H) 70 - 99 mg/dL   BUN 28 (H) 6 - 23 mg/dL   Creatinine, Ser 3.83 (H) 0.50 - 1.35 mg/dL   Calcium 9.3 8.4 - 10.5 mg/dL   Total Protein 7.9 6.0 - 8.3 g/dL   Albumin 2.5 (L) 3.5 - 5.2 g/dL   AST 76 (H) 0 - 37 U/L   ALT 15 0 - 53 U/L   Alkaline Phosphatase 109 39 - 117 U/L   Total Bilirubin 0.5 0.3 - 1.2 mg/dL   GFR calc non Af Amer 14 (L) >90 mL/min   GFR calc Af Amer 16 (L) >90 mL/min    Comment: (NOTE) The eGFR has been calculated using the CKD EPI equation. This calculation has not been validated in all clinical situations. eGFR's persistently <90 mL/min signify possible Chronic Kidney Disease.    Anion gap 17 (H) 5 - 15  Lipase, blood     Status: None   Collection Time: 05/03/14  8:05 AM  Result Value Ref Range   Lipase 25 11 - 59 U/L  I-Stat CG4 Lactic Acid, ED     Status: Abnormal   Collection Time: 05/03/14  8:27 AM  Result Value Ref Range   Lactic Acid, Venous 2.54 (H) 0.5 - 2.2 mmol/L  I-Stat CG4 Lactic Acid, ED     Status: None   Collection Time: 05/03/14  2:26 PM  Result Value Ref Range   Lactic Acid, Venous 2.13 0.5 - 2.2 mmol/L    Ct Abdomen Pelvis W Contrast  05/03/2014   CLINICAL DATA:  Right lower quadrant pain for  several days.  EXAM: CT ABDOMEN AND PELVIS WITH CONTRAST  TECHNIQUE: Multidetector CT imaging of the abdomen and pelvis was performed using the standard protocol following bolus administration of intravenous contrast.  CONTRAST:  133m OMNIPAQUE IOHEXOL 300 MG/ML  SOLN  COMPARISON:  02/03/2014 and 12/09/2011  FINDINGS: Lung bases demonstrate subtle linear/heterogeneous density over the posterior bases likely atelectasis. There is moderate cardiomegaly with left main and 3 vessel atherosclerotic coronary artery disease.  Abdominal images demonstrate mild diffuse hepatic steatosis the spleen, pancreas and adrenal glands are within normal. Again noted is mild cholelithiasis. There is mild gallbladder wall thickening versus minimal pericholecystic fluid. Kidneys are somewhat small with numerous cysts compatible patient's medical renal disease/dialysis. There is a sub cm exophytic indeterminate left renal cortical nodule over the mid to lower pole unchanged from the recent prior exam. Multiple renal vascular calcifications are evident. Ureters are unremarkable. There is moderate calcified plaque of the abdominal aorta and iliac arteries.  There is evidence of patient's previous colon surgery involving the ascending colon near the cecum. There is wall thickening of this segment of colon adjacent the anastomosis. Minimal stranding of the adjacent pericolonic fat. The anastomosis is otherwise patent. There is minimal wall thickening of the small bowel loop immediately proximal to the anastomosis. There are several mildly prominent/borderline dilated small bowel loops which may be due to ileus versus partial obstructive effect at the anastomosis due to the inflamed colon.  Posterior changes of the midline anterior abdominal wall. Small incisional hernia near the umbilicus containing only peritoneal fat.  Pelvic images demonstrate a decompressed bladder. The prostate and rectum are within normal. Bony structures demonstrate  continued evidence of renal osteodystrophy. Degenerative changes of the hips and spine. Multiple Schmorl's nodes throughout the spine.  IMPRESSION: Wall thickening with mild adjacent inflammatory change involving the colon adjacent the anastomosis in the right lower quadrant compatible with acute colitis. Associated colon to small bowel anastomosis appears to be patent with minimal wall thickening of the small bowel immediately proximal to the anastomosis along with several borderline dilated more proximal small bowel loops likely secondary to ileus versus partial obstructing effect at the anastomosis due to the inflamed colon.  Cholelithiasis with evidence of mild gallbladder wall thickening versus adjacent pericholecystic fluid. Right upper quadrant ultrasound may be helpful for further evaluation.  Somewhat small kidneys with numerous cysts unchanged. Indeterminate subcentimeter exophytic left renal nodule unchanged from the recent prior exam. Recommend CT in 6 months.  This recommendation follows ACR consensus guidelines: Managing Incidental Findings on Abdominal CT: White Paper of the ACR Incidental Findings Committee. J Am Coll Radiol 2010;7:754-773  Small incisional hernia adjacent the umbilicus containing only peritoneal fat.  Cardiomegaly with left main and 3 vessel atherosclerotic coronary artery disease. Mild bibasilar atelectasis.  Mild hepatic steatosis.   Electronically Signed   By: DMarin OlpM.D.   On: 05/03/2014 11:04    Review of Systems  Unable to perform ROS: dementia  Constitutional: Negative for fever and chills.  HENT:       Some difficulty hearing  Eyes: Negative for double vision.  Respiratory: Negative for cough.   Cardiovascular: Negative for chest pain and palpitations.  Gastrointestinal: Positive for nausea, vomiting and abdominal pain.  Genitourinary:       No urine  Neurological: Negative for dizziness, sensory change and seizures.   Blood pressure 87/44, pulse 88,  temperature 99.6 F (37.6 C), temperature source Rectal,  resp. rate 22, SpO2 96 %. Physical Exam  Vitals reviewed. Constitutional: He is oriented to person, place, and time. He appears well-developed and well-nourished. He is cooperative. No distress. Cervical collar and nasal cannula in place.  HENT:  Head: Normocephalic and atraumatic. Head is without raccoon's eyes, without Battle's sign, without abrasion, without contusion and without laceration.  Right Ear: Hearing, tympanic membrane, external ear and ear canal normal. No lacerations. No drainage or tenderness. No foreign bodies. Tympanic membrane is not perforated. No hemotympanum.  Left Ear: Hearing, tympanic membrane, external ear and ear canal normal. No lacerations. No drainage or tenderness. No foreign bodies. Tympanic membrane is not perforated. No hemotympanum.  Nose: Nose normal. No nose lacerations, sinus tenderness, nasal deformity or nasal septal hematoma. No epistaxis.  Mouth/Throat: Uvula is midline, oropharynx is clear and moist and mucous membranes are normal. No lacerations.  Eyes: Conjunctivae, EOM and lids are normal. Pupils are equal, round, and reactive to light. No scleral icterus.  Muddy sclera  Neck: Trachea normal. No spinous process tenderness and no muscular tenderness present. Carotid bruit is not present. No thyromegaly present.  Cardiovascular: Normal rate, regular rhythm, normal heart sounds and normal pulses.   Respiratory: Effort normal and breath sounds normal. No respiratory distress. He exhibits no tenderness, no bony tenderness, no laceration and no crepitus.  GI: Soft. Normal appearance. He exhibits no distension. Bowel sounds are decreased. There is tenderness. There is no rigidity, no rebound, no guarding and no CVA tenderness.    Rt sided abd TTP; no rt/guarding/peritionitis  Musculoskeletal: Normal range of motion. He exhibits no edema or tenderness.  L AKA - small open wound - clean; RLE - signs of  chronic arterial insufficiency  Lymphadenopathy:    He has no cervical adenopathy.  Neurological: He is alert and oriented to person, place, and time. He has normal strength. No cranial nerve deficit or sensory deficit. GCS eye subscore is 4. GCS verbal subscore is 5. GCS motor subscore is 6.  Oriented to person, city, month, state but not president;   Skin: Skin is warm, dry and intact. He is not diaphoretic.  Psychiatric: He has a normal mood and affect. His speech is normal and behavior is normal.    Assessment/Plan: Distal small bowel/proximal colon ischemic colitis probably from low flow state during HD ESRD on HD DM2 CAD PAD Nonhealing L AKA  Dementia Prior CVA Nonischemic CM Moderate protein calorie malnutrition Cholelithiasis with mild GB wall thickening Hypotension  Pt has numerous serious co-morbidities. He has no peritonitis on exam, his lactate is trending down. rec starting with non-op mgmt.  Could have component of acute cholecystitis - bc of his dementia difficult to obtain accurate history. Nonetheless, abx for colon will cover GB  Npo. Bowel rest  Place NG tube IV abx Daily labs.  Will need some IVF  Recommend discussing code status with family  Leighton Ruff. Redmond Pulling, MD, FACS General, Bariatric, & Minimally Invasive Surgery [facility], [place]      [facility] M 05/03/2014, 3:57 PM

## 2014-05-04 DIAGNOSIS — Z992 Dependence on renal dialysis: Secondary | ICD-10-CM

## 2014-05-04 DIAGNOSIS — K559 Vascular disorder of intestine, unspecified: Principal | ICD-10-CM

## 2014-05-04 DIAGNOSIS — N186 End stage renal disease: Secondary | ICD-10-CM

## 2014-05-04 DIAGNOSIS — I959 Hypotension, unspecified: Secondary | ICD-10-CM | POA: Diagnosis present

## 2014-05-04 LAB — GLUCOSE, CAPILLARY
GLUCOSE-CAPILLARY: 74 mg/dL (ref 70–99)
GLUCOSE-CAPILLARY: 87 mg/dL (ref 70–99)
Glucose-Capillary: 76 mg/dL (ref 70–99)
Glucose-Capillary: 88 mg/dL (ref 70–99)
Glucose-Capillary: 80 mg/dL (ref 70–99)

## 2014-05-04 LAB — COMPREHENSIVE METABOLIC PANEL
ALT: 14 U/L (ref 0–53)
AST: 65 U/L — AB (ref 0–37)
Albumin: 2.1 g/dL — ABNORMAL LOW (ref 3.5–5.2)
Alkaline Phosphatase: 105 U/L (ref 39–117)
Anion gap: 19 — ABNORMAL HIGH (ref 5–15)
BUN: 35 mg/dL — ABNORMAL HIGH (ref 6–23)
CALCIUM: 9 mg/dL (ref 8.4–10.5)
CO2: 20 mEq/L (ref 19–32)
Chloride: 96 mEq/L (ref 96–112)
Creatinine, Ser: 4.68 mg/dL — ABNORMAL HIGH (ref 0.50–1.35)
GFR calc non Af Amer: 11 mL/min — ABNORMAL LOW (ref >90)
GFR, EST AFRICAN AMERICAN: 13 mL/min — AB (ref >90)
Glucose, Bld: 72 mg/dL (ref 70–99)
Potassium: 4.5 mEq/L (ref 3.7–5.3)
Sodium: 135 mEq/L — ABNORMAL LOW (ref 137–147)
TOTAL PROTEIN: 7.1 g/dL (ref 6.0–8.3)
Total Bilirubin: 0.5 mg/dL (ref 0.3–1.2)

## 2014-05-04 LAB — CBC
HCT: 34.5 % — ABNORMAL LOW (ref 39.0–52.0)
Hemoglobin: 10.5 g/dL — ABNORMAL LOW (ref 13.0–17.0)
MCH: 25.2 pg — ABNORMAL LOW (ref 26.0–34.0)
MCHC: 30.4 g/dL (ref 30.0–36.0)
MCV: 82.9 fL (ref 78.0–100.0)
PLATELETS: 240 10*3/uL (ref 150–400)
RBC: 4.16 MIL/uL — ABNORMAL LOW (ref 4.22–5.81)
RDW: 19 % — AB (ref 11.5–15.5)
WBC: 14.1 10*3/uL — ABNORMAL HIGH (ref 4.0–10.5)

## 2014-05-04 MED ORDER — HEPARIN SODIUM (PORCINE) 5000 UNIT/ML IJ SOLN
5000.0000 [IU] | Freq: Three times a day (TID) | INTRAMUSCULAR | Status: DC
  Administered 2014-05-04 – 2014-05-08 (×8): 5000 [IU] via SUBCUTANEOUS
  Filled 2014-05-04 (×14): qty 1

## 2014-05-04 MED ORDER — MIDODRINE HCL 5 MG PO TABS
10.0000 mg | ORAL_TABLET | Freq: Once | ORAL | Status: AC
  Administered 2014-05-04: 10 mg via ORAL

## 2014-05-04 NOTE — Progress Notes (Signed)
Spoke with Lacy Duverney, PA, on multiple occasions overnight regarding systolic BP in 43B. Received orders for two 217ml boluses, which were administered at 2155 and 2315. BP improved marginally. Fine crackles auscultated in bilateral lung bases, PA aware. Spoke again with PA, who ordered an additional 10mg  dose of midodrine, which was given at [place]. Systolic BP maintained in 80s and 90s, gradually improving with time. PA asked RN to notify if systolic BP drops below 85.  Most recent BP 88/56 (62) at 0500.

## 2014-05-04 NOTE — Clinical Social Work Psychosocial (Signed)
Clinical Social Work Department BRIEF PSYCHOSOCIAL ASSESSMENT 05/04/2014  Patient:  Sean [name], Sean [name]     Account Number:  [id number]     Admit date:  05/03/2014  Clinical Social Worker:  Marciano Sequin  Date/Time:  05/04/2014 03:51 PM  Referred by:  RN  Date Referred:  05/04/2014 Referred for  SNF Placement   Other Referral:   Interview type:  Family Other interview type:   Pt is dioriented; [place] [phone]    PSYCHOSOCIAL DATA Living Status:  FACILITY Admitted from facility:  [facility] Level of care:  [place] Primary support name:  POA Arnold Long Primary support relationship to patient:  CHILD, ADULT Degree of support available:   Strong Support System    CURRENT CONCERNS Current Concerns  None Noted   Other Concerns:    SOCIAL WORK ASSESSMENT / PLAN CSW contacted pt's [place]. CSW introduced self and purpose of call. Kerney Elbe provided CSW with background information regarding the pt's care/health over the past 5 months. Kerney Elbe reported that the pt was receiving skilled care from [facility] and he would like the pt to return. CSW provided [place] with contact information for further questions. CSW will continue to follow this pt and assist with discharge as needed.   Assessment/plan status:  Psychosocial Support/Ongoing Assessment of Needs Other assessment/ plan:   Information/referral to community resources:    PATIENT'S/FAMILY'S RESPONSE TO PLAN OF CARE: Pt's son was receptive and pleased to receive a call from [place]. Pt's son reported understanding the SNF process. Pt's son agreed to the pt returning back to [facility].     [place], MSW, [place]

## 2014-05-04 NOTE — Progress Notes (Signed)
Sean [name]  Sean [name] Sean [name] DOB: Sean [name] Sean [name] Sean [name].  Brief narrative: 78 yo male w/ hx of chronic kidney disease on dialysis, peripheral vascular disease status post left AKA, diabetes, dementia, history of prior ischemic colitis requiring colon resection 2011. He resides at a skilled nursing facility. On the date of admission he was undergoing dialysis when he developed abdominal pain and was sent to the emergency department.  In the ER he was found to be hypotensive with a systolic blood pressure in the 70s. Critical care medicine was called and advised to rehydrate the patient and admit to stepdown. At time of admission patient reported onset of right-sided abdominal pain beginning on the date of admission. No apparent nausea vomiting, diarrhea fevers or chills. White count was 18,000 at admission, lactic acid was elevated at 2.54. Hemoglobin 11.4. Gen Surgery was also consulted.  HPI/Subjective: Alert and still endorsing right-sided abdominal pain. No apparent diarrhea or any lower GI bleed symptoms.  Assessment/Plan:    Colitis, ischemic/PSBO -History of prior episode in 2011 requiring partial colectomy -CT confirms diagnosis of colitis -?hypotension during dialysis contributory -Continue empiric abx to prevent secondary infection -Currently with significant ileus pattern and absent bowel sounds/PSBO continue NG tube -Appreciate general surgery assistance -No signs of lower GI bleeding    Hypotension -Blood pressure remains soft-baseline systolic pressure in the outpatient setting documented at 120 -Has received about 1 L of IV fluids since admission -Baseline hemoglobin appears to be around 9-10; admission hemoglobin around 11.4 which is concerning for possible hemoconcentration due to volume depletion -started on midodrine this admission    End stage renal disease on  dialysis -Nephrology following     Diabetes mellitus  -Current CBGs well-controlled -Cont SSI -Was not on medications prior to admission -Hemoglobin A1c in August was 5.9    Malnutrition of moderate degree -Currently nothing by mouth due to GI illness -Consult nutritional services once can begin diet -If has prolonged ileus/PSBO symptoms may benefit from renal TNA    Atherosclerotic PVD with ulceration/S/P AKA unilateral -PT/OT evaluations when hemodynamically stable -Wound slowly healing   DVT prophylaxis: Lovenox Code Status: Full Family Communication: No family at bedside Disposition Plan/Expected LOS: Sean [name] will return to skilled nursing facility   Consultants: Nephrology/ Dr. Lorrene [name] General surgery/ Dr. Grand[place] [name]  Procedures: None  Cultures: None  Antibiotics: Cipro 11/22 > Flagyl 11/22 >  Objective: Blood pressure 99/53, pulse 80, temperature 99 F (37.2 C), temperature source Oral, resp. rate 20, height 5\' 5"  (1.651 m), weight 167 lb 8.8 oz (76 kg), SpO2 94 %.  Intake/Output Summary (Last 24 hours) at 05/04/14 1335 Last data filed at 05/04/14 0403  Gross per 24 hour  Intake    970 ml  Output    250 ml  Net    720 ml   Exam: Gen: No acute respiratory distress Chest: Clear to auscultation bilaterally without wheezes, rhonchi or crackles, room air Cardiac: Regular rate and rhythm, no rubs murmurs or gallops, no peripheral edema Abdomen: Soft tender in right lower quadrant, slightly distended-no bowel sounds auscultated, no obvious hepatosplenomegaly, no ascites-NG tube to low wall suction with bilious returns Extremities: Symmetrical in appearance (except for prior left AKA) without cyanosis, clubbing or effusion  Scheduled Meds:  Scheduled Meds: . aspirin EC  81 mg Oral Daily  . atorvastatin  20 mg Oral QHS  . ciprofloxacin  400 mg Intravenous Q24H  .  enoxaparin (LOVENOX) injection  30 mg Subcutaneous Q24H  . gabapentin  100 mg Oral QHS   . insulin aspart  0-9 Units Subcutaneous TID WC  . metronidazole  500 mg Intravenous Q8H  . midodrine  10 mg Oral TID WC   Data Reviewed: Basic Metabolic Panel:  Recent Labs Lab 05/03/14 0805 05/04/14 0347  NA 137 135*  K 3.9 4.5  CL 95* 96  CO2 25 20  GLUCOSE 171* 72  BUN 28* 35*  CREATININE 3.83* 4.68*  CALCIUM 9.3 9.0   Liver Function Tests:  Recent Labs Lab 05/03/14 0805 05/04/14 0347  AST 76* 65*  ALT 15 14  ALKPHOS 109 105  BILITOT 0.5 0.5  PROT 7.9 7.1  ALBUMIN 2.5* 2.1*    Recent Labs Lab 05/03/14 0805  LIPASE 25   CBC:  Recent Labs Lab 05/03/14 0805 05/04/14 0347  WBC 18.0* 14.1*  NEUTROABS 15.0*  --   HGB 11.4* 10.5*  HCT 38.1* 34.5*  MCV 86.2 82.9  PLT 270 240   CBG:  Recent Labs Lab 05/03/14 1827 05/03/14 2246 05/04/14 0748 05/04/14 1231  GLUCAP 104* 96 87 76    Recent Results (from the past 240 hour(s))  MRSA PCR Screening     Status: None   Collection Time: 05/03/14  4:19 PM  Result Value Ref Range Status   MRSA by PCR NEGATIVE NEGATIVE Final    Comment:        The GeneXpert MRSA Assay (FDA approved for NASAL specimens only), is one component of a comprehensive MRSA colonization surveillance program. It is not intended to diagnose MRSA infection nor to guide or monitor treatment for MRSA infections.      Studies:  Recent x-ray studies have been reviewed in detail by the Attending Physician  Time spent : Sean [name]  [phone] Pager [phone]   On-Call/Text Page:      Sean [name]      password [email or web address]  If 7PM-7AM, please contact night-coverage [email or web address] Password [email or web address] 05/04/2014, 1:35 PM   LOS: 1 day   I have personally examined this patient and reviewed the entire database. I have reviewed the above [name], made any necessary editorial changes, and agree with its content.  Cherene Altes, MD [facility]

## 2014-05-04 NOTE — Consult Note (Signed)
[facility] Renal Consultation Note  Indication for Consultation:  Management of ESRD/hemodialysis; anemia, hypertension/volume and secondary hyperparathyroidism  HPI: Sean [name] is a 78 y.o. 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  His blood pressure did not improve with fluid boluses and was in the 70s on arrival to the ED.  He has a history of ischemic colitis with colon resection in 2011, and his current symptoms strongly suggest bowel ischemia, worsened by low blood pressures.  Nasogastric tube was placed yesterday, and he currently has no complaints.    Dialysis Orders:   MWF @ [place] 4hrs    74 kg     2K/2Ca     400/800      Profile 4       Heparin 5000 U        AVG @ LFA   No Hectorol                Aranesp 160 mcg on Thurs           No Venofer  Past Medical History  Diagnosis Date  . Hyperparathyroidism   . Arthritis   . Hyperlipidemia   . Hypertension   . Neuromuscular disorder   . Stroke   . GERD (gastroesophageal reflux disease)   . Migraine   . Heart attack 2011; 1990's  . Peripheral neuropathy   . Anxiety   . Depression   . Dialysis patient     M, W, F; [place] CAD (coronary artery disease)     a. s/p NSTEMI and failed RCA PCI in 2011. b. Nuc 12/2013: nonischemic, prior inferior MI.  Marland Kitchen LBBB (left bundle branch block)   . Calciphylaxis     a. Multiple admissions for nonhealing L ulcer/sepsis, s/p L AKA 02/02/2014.  Marland Kitchen Chronic anemia   . Dementia   . Type II diabetes  mellitus     not taking any medications.  . ESRD (end stage renal disease) on dialysis   . Renal insufficiency    Past Surgical History  Procedure Laterality Date  . Colon surgery  2011    colon resection   . Parathyroidectomy  2011  . Capd insertion  09/12/2011    Procedure: LAPAROSCOPIC INSERTION CONTINUOUS AMBULATORY PERITONEAL DIALYSIS  (CAPD) CATHETER;  Surgeon: Edward Jolly, MD;  Location: [place];  Service: General;  Laterality: N/A;  . Sp av dialysis shunt access existing *l*  2003  . Dg av dialysis shunt intro ndl*r* or      "not working" (09/19/11)  . Capd removal  03/12/2012    Procedure: CONTINUOUS AMBULATORY PERITONEAL DIALYSIS  (CAPD) CATHETER REMOVAL;  Surgeon: Edward Jolly, MD;  Location: [place];  Service: General;  Laterality: N/A;  . Amputation Left 02/02/2014    Procedure: AMPUTATION ABOVE KNEE-LEFT;  Surgeon: Elam Dutch, MD;  Location: [place];  Service: Vascular;  Laterality: Left;  . I&d extremity Left 03/31/2014    Procedure: IRRIGATION AND DEBRIDEMENT EXTREMITY - LEFT ABOVE KNEE AMPUTATION;  Surgeon: Elam Dutch, MD;  Location:  [facility] OR;  Service: Vascular;  Laterality: Left;  . Application of wound vac Left 03/31/2014    Procedure: APPLICATION OF WOUND VAC - LEFT AKA;  Surgeon: Elam Dutch, MD;  Location: [facility] OR;  Service: Vascular;  Laterality: Left;   Family History  Problem Relation Age of Onset  . Cancer Sister     breast  . Cancer Brother     lung  . Anesthesia problems Neg Hx   . Hypotension Neg Hx   . Malignant hyperthermia Neg Hx   . Pseudochol deficiency Neg Hx    Social History He quit smoking cigarettes in 1975 after a 1.75 pack-year smoking history, but never used smokeless tobacco. He occasionally drinks wine, but denies any history of illicit drug use. He previously lived alone before moving to [place] in August.  He has two daughters and one son and previously worked as a Nurse, children's.  Allergies   Allergen Reactions  . Benazepril Hcl Hives, Itching and Rash    flareups on patient head   Prior to Admission medications   Medication Sig Start Date End Date Taking? Authorizing Provider  acetaminophen (TYLENOL) 325 MG tablet Take 2 tablets (650 mg total) by mouth every 6 (six) hours as needed for mild pain, fever or headache. 12/24/13  Yes Debbe Odea, MD  albuterol (PROVENTIL) (2.5 MG/3ML) 0.083% nebulizer solution Take 3 mLs (2.5 mg total) by nebulization every 6 (six) hours as needed for wheezing or shortness of breath. 01/21/14  Yes Ripudeep Krystal Eaton, MD  aspirin EC 81 MG tablet Take 81 mg by mouth daily.   Yes Historical Provider, MD  atorvastatin (LIPITOR) 20 MG tablet Take 20 mg by mouth at bedtime.   Yes Historical Provider, MD  b complex-vitamin c-folic acid (NEPHRO-VITE) 0.8 MG TABS Take 0.8 mg by mouth at bedtime.   Yes Historical Provider, MD  gabapentin (NEURONTIN) 100 MG capsule Take 100 mg by mouth at bedtime.    Yes Historical Provider, MD  HYDROcodone-acetaminophen (NORCO/VICODIN) 5-325 MG per tablet Take 1 tablet by mouth every 6 (six) hours as needed for moderate pain.   Yes Historical Provider, MD  feeding supplement, RESOURCE BREEZE, (RESOURCE BREEZE) LIQD Take 1 Container by mouth 2 (two) times daily between meals. Patient not taking: Reported on 05/03/2014 01/21/14   Ripudeep Krystal Eaton, MD  HYDROcodone-acetaminophen (NORCO) 10-325 MG per tablet Take 1 tablet by mouth every 4 (four) hours as needed. Patient not taking: Reported on 05/03/2014 04/07/14   Gerlene Fee, NP   Review of Systems Constitutional: negative for chills, fatigue, fevers and sweats Ears, nose, mouth, throat, and face: negative for earaches, hoarseness, nasal congestion and sore throat Respiratory: negative for cough, dyspnea on exertion, hemoptysis and sputum Cardiovascular: negative for chest pain, chest pressure/discomfort, dyspnea, orthopnea and palpitations Gastrointestinal: positive for mild  right-side abdominal pain, negative for change in bowel habits, nausea and vomiting Genitourinary:negative, anuric Musculoskeletal:negative for arthralgias, back pain, myalgias and neck pain Neurological: negative for dizziness, headaches, speech problems and weakness  Physical Exam: BP 99/53 mmHg  Pulse 80  Temp(Src) 99 F (37.2 C) (Oral)  Resp 20  Ht 5' 5"  (1.651 m)  Wt 76 kg (167 lb 8.8 oz)  BMI 27.88 kg/m2  SpO2 94%  General appearance: alert, cooperative and no distress NG tube in place, to suction, with dark brown drainage Head: Normocephalic, without obvious abnormality, atraumatic Neck: no adenopathy, no carotid bruit, no JVD and supple, symmetrical, trachea midline Resp: clear to auscultation  bilaterally Cardio: regular rate and rhythm, S1, S2 normal, no murmur, click, rub or gallop GI: + BS, soft with RLQ tenderness to palpation abd currently quiet, few BS, mod right sided tenderness Extremities: left AKA with dressing, no edema on right Neurologic: Grossly normal Dialysis Access: AVG @ LFA with + bruit   Labs:  Results for orders placed or performed during the hospital encounter of 05/03/14 (from the past 48 hour(s))  CBC with Differential     Status: Abnormal   Collection Time: 05/03/14  8:05 AM  Result Value Ref Range   WBC 18.0 (H) 4.0 - 10.5 K/uL   RBC 4.42 4.22 - 5.81 MIL/uL   Hemoglobin 11.4 (L) 13.0 - 17.0 g/dL   HCT 38.1 (L) 39.0 - 52.0 %   MCV 86.2 78.0 - 100.0 fL   MCH 25.8 (L) 26.0 - 34.0 pg   MCHC 29.9 (L) 30.0 - 36.0 g/dL   RDW 19.1 (H) 11.5 - 15.5 %   Platelets 270 150 - 400 K/uL   Neutrophils Relative % 83 (H) 43 - 77 %   Neutro Abs 15.0 (H) 1.7 - 7.7 K/uL   Lymphocytes Relative 6 (L) 12 - 46 %   Lymphs Abs 1.1 0.7 - 4.0 K/uL   Monocytes Relative 10 3 - 12 %   Monocytes Absolute 1.7 (H) 0.1 - 1.0 K/uL   Eosinophils Relative 1 0 - 5 %   Eosinophils Absolute 0.1 0.0 - 0.7 K/uL   Basophils Relative 0 0 - 1 %   Basophils Absolute 0.0 0.0 - 0.1  K/uL  Comprehensive metabolic panel     Status: Abnormal   Collection Time: 05/03/14  8:05 AM  Result Value Ref Range   Sodium 137 137 - 147 mEq/L   Potassium 3.9 3.7 - 5.3 mEq/L   Chloride 95 (L) 96 - 112 mEq/L   CO2 25 19 - 32 mEq/L   Glucose, Bld 171 (H) 70 - 99 mg/dL   BUN 28 (H) 6 - 23 mg/dL   Creatinine, Ser 3.83 (H) 0.50 - 1.35 mg/dL   Calcium 9.3 8.4 - 10.5 mg/dL   Total Protein 7.9 6.0 - 8.3 g/dL   Albumin 2.5 (L) 3.5 - 5.2 g/dL   AST 76 (H) 0 - 37 U/L   ALT 15 0 - 53 U/L   Alkaline Phosphatase 109 39 - 117 U/L   Total Bilirubin 0.5 0.3 - 1.2 mg/dL   GFR calc non Af Amer 14 (L) >90 mL/min   GFR calc Af Amer 16 (L) >90 mL/min    Comment: (NOTE) The eGFR has been calculated using the CKD EPI equation. This calculation has not been validated in all clinical situations. eGFR's persistently <90 mL/min signify possible Chronic Kidney Disease.    Anion gap 17 (H) 5 - 15  Lipase, blood     Status: None   Collection Time: 05/03/14  8:05 AM  Result Value Ref Range   Lipase 25 11 - 59 U/L  I-Stat CG4 Lactic Acid, ED     Status: Abnormal   Collection Time: 05/03/14  8:27 AM  Result Value Ref Range   Lactic Acid, Venous 2.54 (H) 0.5 - 2.2 mmol/L  I-Stat CG4 Lactic Acid, ED     Status: None   Collection Time: 05/03/14  2:26 PM  Result Value Ref Range   Lactic Acid, Venous 2.13 0.5 - 2.2 mmol/L  MRSA PCR Screening     Status: None   Collection Time: 05/03/14  4:19  PM  Result Value Ref Range   MRSA by PCR NEGATIVE NEGATIVE    Comment:        The GeneXpert MRSA Assay (FDA approved for NASAL specimens only), is one component of a comprehensive MRSA colonization surveillance program. It is not intended to diagnose MRSA infection nor to guide or monitor treatment for MRSA infections.   Glucose, capillary     Status: Abnormal   Collection Time: 05/03/14  6:27 PM  Result Value Ref Range   Glucose-Capillary 104 (H) 70 - 99 mg/dL  Glucose, capillary     Status: None    Collection Time: 05/03/14 10:46 PM  Result Value Ref Range   Glucose-Capillary 96 70 - 99 mg/dL   Comment 1 Documented in Chart    Comment 2 Notify RN   CBC     Status: Abnormal   Collection Time: 05/04/14  3:47 AM  Result Value Ref Range   WBC 14.1 (H) 4.0 - 10.5 K/uL   RBC 4.16 (L) 4.22 - 5.81 MIL/uL   Hemoglobin 10.5 (L) 13.0 - 17.0 g/dL   HCT 34.5 (L) 39.0 - 52.0 %   MCV 82.9 78.0 - 100.0 fL   MCH 25.2 (L) 26.0 - 34.0 pg   MCHC 30.4 30.0 - 36.0 g/dL   RDW 19.0 (H) 11.5 - 15.5 %   Platelets 240 150 - 400 K/uL  Comprehensive metabolic panel     Status: Abnormal   Collection Time: 05/04/14  3:47 AM  Result Value Ref Range   Sodium 135 (L) 137 - 147 mEq/L   Potassium 4.5 3.7 - 5.3 mEq/L   Chloride 96 96 - 112 mEq/L   CO2 20 19 - 32 mEq/L   Glucose, Bld 72 70 - 99 mg/dL   BUN 35 (H) 6 - 23 mg/dL   Creatinine, Ser 4.68 (H) 0.50 - 1.35 mg/dL   Calcium 9.0 8.4 - 10.5 mg/dL   Total Protein 7.1 6.0 - 8.3 g/dL   Albumin 2.1 (L) 3.5 - 5.2 g/dL   AST 65 (H) 0 - 37 U/L   ALT 14 0 - 53 U/L   Alkaline Phosphatase 105 39 - 117 U/L   Total Bilirubin 0.5 0.3 - 1.2 mg/dL   GFR calc non Af Amer 11 (L) >90 mL/min   GFR calc Af Amer 13 (L) >90 mL/min    Comment: (NOTE) The eGFR has been calculated using the CKD EPI equation. This calculation has not been validated in all clinical situations. eGFR's persistently <90 mL/min signify possible Chronic Kidney Disease.    Anion gap 19 (H) 5 - 15  Glucose, capillary     Status: None   Collection Time: 05/04/14  7:48 AM  Result Value Ref Range   Glucose-Capillary 87 70 - 99 mg/dL   Comment 1 Documented in Chart    Comment 2 Notify RN    CT SCAN 11/22/15Wall thickening with mild adjacent inflammatory change involving the colon adjacent the anastomosis in the right lower quadrant compatible with acute colitis  Assessment/Plan: 1. RLQ abdominal pain - likely ischemic colitis, with NG tube, symptomatically better, WBCs down to 14.1 (from 18),  Surgery following. 2. ESRD - HD on MWF @ [place], K 4.5, only 46 mins yesterday, but will hold HD until tomorrow (holiday schedule). 3. Hypertension/volume - BP currently 99/53 s/p IVF, wt 76 kg, last reached EDW on 11/13. 4. Anemia - Hgb 10.5, Aranesp 160 mcg on Thurs. 5. Metabolic bone disease - Ca 9 (10.5 corrected), last  P 3.3, iPTH 334 (parathyroidectomy in 2011); no binders. 6. Nutrition - Alb 2.1, NPO, vitamin. 7. Cholecystitis - in 01/2014, s/p perc chole tube, removed after cholangiogram showed patent cystic duct. 8. DM Type 2 - diet controlled, last Hgb A1C 6.5. 9. Hx CAD - evaluated for chest pain 7/4-7, LBBB per EKG, negative stress test, but decreasing EF, refused cardiac cath. 10. Hx CVA - most recently in 09/2011, also in 2000.  LYLES,CHARLES 05/04/2014, 10:54 AM   Attending Nephrologist: Jamal Maes, MD I have seen and examined this patient and agree with plan and assessment in the above note by C Lyles with highlighted additions.  Pt presented to HD for his treatment yesterday with hypotension, and treatment was terminated after 46 minutes when BP did not respond to fluid boluses.  Hypotensive on arrival to the ED, CT compatible with right sided colitis.  Presently getting NG suction, bowel rest, antibiotics with improving lactate and WBC. BP low 100's. Since K, acid base OK will hold off on HD today (last treatment was 11/22 but terminated after 46 minutes so effectively last one was on 11/20 but labs and volume are OK at this time). Anticipate need tomorrow.   Kinnedy Mongiello B,MD 05/04/2014 1:48 PM

## 2014-05-04 NOTE — Progress Notes (Signed)
Utilization review completed. Charan Prieto, RN, BSN. 

## 2014-05-04 NOTE — Progress Notes (Signed)
Subjective: Less pain  Objective: Vital signs in last 24 hours: Temp:  [98.6 F (37 C)-100.1 F (37.8 C)] 99.2 F (37.3 C) (11/23 0700) Pulse Rate:  [78-89] 80 (11/23 0700) Resp:  [12-24] 20 (11/23 0700) BP: (55-99)/(31-59) 99/53 mmHg (11/23 0700) SpO2:  [88 %-97 %] 94 % (11/23 0700) Weight:  [167 lb 3.2 oz (75.841 kg)-167 lb 8.8 oz (76 kg)] 167 lb 8.8 oz (76 kg) (11/23 0355)    Intake/Output from previous day: 11/22 0701 - 11/23 0700 In: 1220 [I.V.:500; NG/GT:20; IV Piggyback:700] Out: 250 [Emesis/NG output:250] Intake/Output this shift:    General appearance: cooperative Resp: clear to auscultation bilaterally Cardio: S1, S2 normal GI: soft, tender RLQ without guarding, otherwise non-tender  Lab Results:   Recent Labs  05/03/14 0805 05/04/14 0347  WBC 18.0* 14.1*  HGB 11.4* 10.5*  HCT 38.1* 34.5*  PLT 270 240   BMET  Recent Labs  05/03/14 0805 05/04/14 0347  NA 137 135*  K 3.9 4.5  CL 95* 96  CO2 25 20  GLUCOSE 171* 72  BUN 28* 35*  CREATININE 3.83* 4.68*  CALCIUM 9.3 9.0   PT/INR No results for input(s): LABPROT, INR in the last 72 hours. ABG No results for input(s): PHART, HCO3 in the last 72 hours.  Invalid input(s): PCO2, PO2  Studies/Results: Ct Abdomen Pelvis W Contrast  05/03/2014   CLINICAL DATA:  Right lower quadrant pain for several days.  EXAM: CT ABDOMEN AND PELVIS WITH CONTRAST  TECHNIQUE: Multidetector CT imaging of the abdomen and pelvis was performed using the standard protocol following bolus administration of intravenous contrast.  CONTRAST:  155mL OMNIPAQUE IOHEXOL 300 MG/ML  SOLN  COMPARISON:  02/03/2014 and 12/09/2011  FINDINGS: Lung bases demonstrate subtle linear/heterogeneous density over the posterior bases likely atelectasis. There is moderate cardiomegaly with left main and 3 vessel atherosclerotic coronary artery disease.  Abdominal images demonstrate mild diffuse hepatic steatosis the spleen, pancreas and adrenal  glands are within normal. Again noted is mild cholelithiasis. There is mild gallbladder wall thickening versus minimal pericholecystic fluid. Kidneys are somewhat small with numerous cysts compatible patient's medical renal disease/dialysis. There is a sub cm exophytic indeterminate left renal cortical nodule over the mid to lower pole unchanged from the recent prior exam. Multiple renal vascular calcifications are evident. Ureters are unremarkable. There is moderate calcified plaque of the abdominal aorta and iliac arteries.  There is evidence of patient's previous colon surgery involving the ascending colon near the cecum. There is wall thickening of this segment of colon adjacent the anastomosis. Minimal stranding of the adjacent pericolonic fat. The anastomosis is otherwise patent. There is minimal wall thickening of the small bowel loop immediately proximal to the anastomosis. There are several mildly prominent/borderline dilated small bowel loops which may be due to ileus versus partial obstructive effect at the anastomosis due to the inflamed colon.  Posterior changes of the midline anterior abdominal wall. Small incisional hernia near the umbilicus containing only peritoneal fat.  Pelvic images demonstrate a decompressed bladder. The prostate and rectum are within normal. Bony structures demonstrate continued evidence of renal osteodystrophy. Degenerative changes of the hips and spine. Multiple Schmorl's nodes throughout the spine.  IMPRESSION: Wall thickening with mild adjacent inflammatory change involving the colon adjacent the anastomosis in the right lower quadrant compatible with acute colitis. Associated colon to small bowel anastomosis appears to be patent with minimal wall thickening of the small bowel immediately proximal to the anastomosis along with several borderline dilated more proximal small  bowel loops likely secondary to ileus versus partial obstructing effect at the anastomosis due to the  inflamed colon.  Cholelithiasis with evidence of mild gallbladder wall thickening versus adjacent pericholecystic fluid. Right upper quadrant ultrasound may be helpful for further evaluation.  Somewhat small kidneys with numerous cysts unchanged. Indeterminate subcentimeter exophytic left renal nodule unchanged from the recent prior exam. Recommend CT in 6 months.  This recommendation follows ACR consensus guidelines: Managing Incidental Findings on Abdominal CT: White Paper of the ACR Incidental Findings Committee. J Am Coll Radiol 2010;7:754-773  Small incisional hernia adjacent the umbilicus containing only peritoneal fat.  Cardiomegaly with left main and 3 vessel atherosclerotic coronary artery disease. Mild bibasilar atelectasis.  Mild hepatic steatosis.   Electronically Signed   By: Marin Olp M.D.   On: 05/03/2014 11:04   Dg Chest Port 1 View  05/03/2014   CLINICAL DATA:  Encounter for NG tube placement.  EXAM: PORTABLE CHEST - 1 VIEW  COMPARISON:  Radiograph 03/31/2014  FINDINGS: Interval placement of NG tube the tip of which extends beyond the lower margin of the film in the course of the stomach. Stable enlarged cardiac silhouette. There is central venous pulmonary congestion. Bibasilar atelectasis.  IMPRESSION: NG tube extends into the stomach and beyond the lower margin of film.   Electronically Signed   By: Suzy Bouchard M.D.   On: 05/03/2014 18:37    Anti-infectives: Anti-infectives    Start     Dose/Rate Route Frequency Ordered Stop   05/04/14 1500  ciprofloxacin (CIPRO) IVPB 400 mg     400 mg200 mL/hr over 60 Minutes Intravenous Every 24 hours 05/03/14 1341     05/03/14 2000  metroNIDAZOLE (FLAGYL) IVPB 500 mg     500 mg100 mL/hr over 60 Minutes Intravenous Every 8 hours 05/03/14 1511     05/03/14 1600  metroNIDAZOLE (FLAGYL) IVPB 500 mg  Status:  Discontinued     500 mg100 mL/hr over 60 Minutes Intravenous Every 8 hours 05/03/14 1554 05/03/14 1647   05/03/14 1400  ciprofloxacin  (CIPRO) IVPB 400 mg     400 mg200 mL/hr over 60 Minutes Intravenous  Once 05/03/14 1340 05/03/14 1538   05/03/14 1130  metroNIDAZOLE (FLAGYL) IVPB 500 mg     500 mg100 mL/hr over 60 Minutes Intravenous  Once 05/03/14 1126 05/03/14 1343      Assessment/Plan: Ischemic colitis RLQ - improved exam and WBC down some, follow closely. I D/W renal PA and agree with holding off on HD Cholelithiasis - recent perc chole tube was removed after F/U cholangiogram showed patent cystic duct. No RUQ tenderness at this time so doubt this is a current issue.  LOS: 1 day    Eriko Economos E 05/04/2014

## 2014-05-04 NOTE — Plan of Care (Signed)
Problem: Phase I Progression Outcomes Goal: Pain controlled with appropriate interventions Outcome: Completed/Met Date Met:  05/04/14 Goal: Voiding-avoid urinary catheter unless indicated Outcome: Not Applicable Date Met:  10/14/65 aneuric

## 2014-05-05 ENCOUNTER — Inpatient Hospital Stay (HOSPITAL_COMMUNITY): Payer: Medicare Other

## 2014-05-05 DIAGNOSIS — E440 Moderate protein-calorie malnutrition: Secondary | ICD-10-CM | POA: Insufficient documentation

## 2014-05-05 DIAGNOSIS — Z992 Dependence on renal dialysis: Secondary | ICD-10-CM

## 2014-05-05 DIAGNOSIS — L98499 Non-pressure chronic ulcer of skin of other sites with unspecified severity: Secondary | ICD-10-CM

## 2014-05-05 DIAGNOSIS — I70209 Unspecified atherosclerosis of native arteries of extremities, unspecified extremity: Secondary | ICD-10-CM | POA: Insufficient documentation

## 2014-05-05 DIAGNOSIS — N186 End stage renal disease: Secondary | ICD-10-CM | POA: Insufficient documentation

## 2014-05-05 DIAGNOSIS — E119 Type 2 diabetes mellitus without complications: Secondary | ICD-10-CM

## 2014-05-05 DIAGNOSIS — K8020 Calculus of gallbladder without cholecystitis without obstruction: Secondary | ICD-10-CM | POA: Insufficient documentation

## 2014-05-05 LAB — GLUCOSE, CAPILLARY
GLUCOSE-CAPILLARY: 73 mg/dL (ref 70–99)
GLUCOSE-CAPILLARY: 94 mg/dL (ref 70–99)
Glucose-Capillary: 101 mg/dL — ABNORMAL HIGH (ref 70–99)
Glucose-Capillary: 63 mg/dL — ABNORMAL LOW (ref 70–99)
Glucose-Capillary: 60 mg/dL — ABNORMAL LOW (ref 70–99)

## 2014-05-05 LAB — CBC
HCT: 40.2 % (ref 39.0–52.0)
Hemoglobin: 12.3 g/dL — ABNORMAL LOW (ref 13.0–17.0)
MCH: 26 pg (ref 26.0–34.0)
MCHC: 30.6 g/dL (ref 30.0–36.0)
MCV: 85 fL (ref 78.0–100.0)
Platelets: 276 10*3/uL (ref 150–400)
RBC: 4.73 MIL/uL (ref 4.22–5.81)
RDW: 19.1 % — ABNORMAL HIGH (ref 11.5–15.5)
WBC: 11.7 10*3/uL — AB (ref 4.0–10.5)

## 2014-05-05 LAB — RENAL FUNCTION PANEL
Albumin: 2.2 g/dL — ABNORMAL LOW (ref 3.5–5.2)
Anion gap: 24 — ABNORMAL HIGH (ref 5–15)
BUN: 43 mg/dL — AB (ref 6–23)
CALCIUM: 9.1 mg/dL (ref 8.4–10.5)
CO2: 18 meq/L — AB (ref 19–32)
CREATININE: 5.83 mg/dL — AB (ref 0.50–1.35)
Chloride: 91 mEq/L — ABNORMAL LOW (ref 96–112)
GFR calc Af Amer: 10 mL/min — ABNORMAL LOW (ref >90)
GFR calc non Af Amer: 8 mL/min — ABNORMAL LOW (ref >90)
Glucose, Bld: 58 mg/dL — ABNORMAL LOW (ref 70–99)
Phosphorus: 4.9 mg/dL — ABNORMAL HIGH (ref 2.3–4.6)
Potassium: 4.5 mEq/L (ref 3.7–5.3)
Sodium: 133 mEq/L — ABNORMAL LOW (ref 137–147)

## 2014-05-05 LAB — HEPATITIS B SURFACE ANTIGEN: HEP B S AG: NEGATIVE

## 2014-05-05 MED ORDER — DEXTROSE 50 % IV SOLN
25.0000 mL | Freq: Once | INTRAVENOUS | Status: AC
  Administered 2014-05-05: 25 mL via INTRAVENOUS

## 2014-05-05 MED ORDER — DEXTROSE 50 % IV SOLN
INTRAVENOUS | Status: AC
  Administered 2014-05-05: 25 mL via INTRAVENOUS
  Filled 2014-05-05: qty 50

## 2014-05-05 MED ORDER — INFLUENZA VAC SPLIT QUAD 0.5 ML IM SUSY
0.5000 mL | PREFILLED_SYRINGE | INTRAMUSCULAR | Status: DC
  Filled 2014-05-05: qty 0.5

## 2014-05-05 MED ORDER — MIDODRINE HCL 5 MG PO TABS
ORAL_TABLET | ORAL | Status: AC
  Filled 2014-05-05: qty 2

## 2014-05-05 NOTE — Procedures (Signed)
I have personally attended this patient's dialysis session.   Using no heparin, 2K bath (K 4.5), and no volume off to avoid hypotension in setting of ischemic colitis Now on midodrine for BP support (since this admission)  Jamal Maes, MD [place] Pager 05/05/2014, 11:52 AM

## 2014-05-05 NOTE — Progress Notes (Signed)
Inpatient Diabetes Program Recommendations  AACE/ADA: New Consensus Statement on Inpatient Glycemic Control (2013)  Target Ranges:  Prepandial:   less than 140 mg/dL      Peak postprandial:   less than 180 mg/dL (1-2 hours)      Critically ill patients:  140 - 180 mg/dL   Noted glucose is running a bit low, dextrose given times 2 since admission. Noted plan to initiate TNA if pt remains  NPO, would recommend adding dextrose to IV fluids. (if appropriate per renal status)    Thank you, Rosita Kea, RN, CNS, Diabetes Coordinator [phone])

## 2014-05-05 NOTE — Progress Notes (Signed)
TEAM 1 - Stepdown/ICU TEAM Progress Note  Sean [name] EZM:[id number] DOB: 1935-09-03 DOA: 05/03/2014 PCP: No primary care provider on file.  Admit HPI / Brief Narrative: 78 yo BM PMHx anxiety, depression, dementia, ESRD on HD on M/W/F , Hx LBBB, Hx MI (NSTEMI 2011), peripheral vascular disease status post left AKA, diabetes, dementia, history of prior ischemic colitis requiring colon resection 2011. He resides at a skilled nursing facility. On the date of admission he was undergoing dialysis when he developed abdominal pain and was sent to the emergency department.  In the ER he was found to be hypotensive with a systolic blood pressure in the 70s. Critical care medicine was called and advised to rehydrate the patient and admit to stepdown. At time of admission patient reported onset of right-sided abdominal pain beginning on the date of admission. No apparent nausea vomiting, diarrhea fevers or chills. White count was 18,000 at admission, lactic acid was elevated at 2.54. Hemoglobin 11.4. Gen Surgery was also consulted.  HPI/Subjective: 11/24 A/O 3 (unsure why), however patient easily confused and continues to think at times he is locked up. Negative CP, negative SOB, negative nausea/vomiting, mild abdominal pain  Assessment/Plan: Colitis, ischemic/PSBO -History of prior episode in 2011 requiring partial colectomy -CT confirms diagnosis of colitis; continue ciprofloxacin+ metronidazole -Currently with significant ileus pattern and absent bowel sounds/PSBO continue NG tube -Continue nothing by mouth -No signs of lower GI bleeding -Surgery recommends conservative medical management -Have CSW set up a family meeting to discuss goals of care/coat status.  Cholelithiasis -Obtain abdominal ultrasound to better quantify gallbladder disease   Hypotension -Blood pressure remains soft-baseline systolic pressure in the outpatient setting documented at 120 -Baseline hemoglobin  appears to be around 9-10; admission hemoglobin around 11.4 which is concerning for possible hemoconcentration due to volume depletion -started on midodrine this admission 10 mg  TID   End stage renal disease on dialysis -Nephrology following; HD per nephrology    Diabetes mellitus  -Current CBGs well-controlled -Cont sensitive SSI -Was not on medications prior to admission -Hemoglobin A1c in August= 5.9   Malnutrition of moderate degree -Currently nothing by mouth due to GI illness -Consult nutritional services once can begin diet -If has prolonged ileus/PSBO symptoms may benefit from renal TNA   Atherosclerotic PVD with ulceration/S/P Lt AKA unilateral -PT/OT evaluations when hemodynamically stable -Wound slowly healing, negative sign of infection     Code Status: Full Family Communication: No family at bedside Disposition Plan/Expected LOS: Sean [name] will return to skilled nursing facility     Consultants: Dr. Greer Pickerel (surgery) Dr. Baxter Kail [facility] M)    Procedure/Significant Events: 11/22 CT abdomen pelvis with contrast;Wall thickening with mild adjacent inflammatory change involving  colon adjacent the anastomosis in the right lower quadrant compatible with acute colitis. -Colon to small bowel anastomosis appears to be patent with minimal wall thickening of the small bowel immediately proximal to the anastomosis along with several borderline dilated more proximal small bowel loops likely secondary to ileus versus partial obstructing  -Cholelithiasis with evidence of mild gallbladder wall thickening -Small kidneys with numerous cysts unchanged. Indeterminate subcentimeter exophytic left renal nodule unchanged from the recent prior exam. Recommend CT in 6 months. -Cardiomegaly with left main and 3 vessel atherosclerotic coronary artery disease. Mild bibasilar atelectasis.   Culture 11/22 MRSA by PCR negative   Antibiotics: Ciprofloxacin  11/23>> Metronidazole 11/22>>    DVT prophylaxis: Lovenox   Devices N/A   LINES / TUBES:  Continuous Infusions:   Objective: VITAL SIGNS: Temp: 100.1 F (37.8 C) (11/24 1424) Temp Source: Oral (11/24 1424) BP: 110/54 mmHg (11/24 1424) Pulse Rate: 83 (11/24 1445) SPO2; FIO2:   Intake/Output Summary (Last 24 hours) at 05/05/14 1519 Last data filed at 05/05/14 1424  Gross per 24 hour  Intake    610 ml  Output    830 ml  Net   -220 ml     Exam: General: A/O 3 (unsure why), but becomes confused easily, NAD, No acute respiratory distress Lungs: Clear to auscultation bilaterally without wheezes or crackles Cardiovascular: Regular rate and rhythm without murmur gallop or rub normal S1 and S2 Abdomen: Nontender, nondistended, soft, bowel sounds positive, no rebound, no ascites, no appreciable mass Extremities: No significant cyanosis, clubbing, or edema right lower extremity, left extremity AKA covered and clean, 3 areas not completely healed, mild serosanguineous fluid, negative pus, negative erythema or signs of infection.   Data Reviewed: Basic Metabolic Panel:  Recent Labs Lab 05/03/14 0805 05/04/14 0347 05/05/14 0318  NA 137 135* 133*  K 3.9 4.5 4.5  CL 95* 96 91*  CO2 25 20 18*  GLUCOSE 171* 72 58*  BUN 28* 35* 43*  CREATININE 3.83* 4.68* 5.83*  CALCIUM 9.3 9.0 9.1  PHOS  --   --  4.9*   Liver Function Tests:  Recent Labs Lab 05/03/14 0805 05/04/14 0347 05/05/14 0318  AST 76* 65*  --   ALT 15 14  --   ALKPHOS 109 105  --   BILITOT 0.5 0.5  --   PROT 7.9 7.1  --   ALBUMIN 2.5* 2.1* 2.2*    Recent Labs Lab 05/03/14 0805  LIPASE 25   No results for input(s): AMMONIA in the last 168 hours. CBC:  Recent Labs Lab 05/03/14 0805 05/04/14 0347 05/05/14 0318  WBC 18.0* 14.1* 11.7*  NEUTROABS 15.0*  --   --   HGB 11.4* 10.5* 12.3*  HCT 38.1* 34.5* 40.2  MCV 86.2 82.9 85.0  PLT 270 240 276   Cardiac Enzymes: No results for  input(s): CKTOTAL, CKMB, CKMBINDEX, TROPONINI in the last 168 hours. BNP (last 3 results)  Recent Labs  12/13/13 1408 01/17/14 0800  PROBNP >70000.0* >70000.0*   CBG:  Recent Labs Lab 05/04/14 2009 05/04/14 2330 05/05/14 0349 05/05/14 0417 05/05/14 0722  GLUCAP 80 74 63* 101* 73    Recent Results (from the past 240 hour(s))  MRSA PCR Screening     Status: None   Collection Time: 05/03/14  4:19 PM  Result Value Ref Range Status   MRSA by PCR NEGATIVE NEGATIVE Final    Comment:        The GeneXpert MRSA Assay (FDA approved for NASAL specimens only), is one component of a comprehensive MRSA colonization surveillance program. It is not intended to diagnose MRSA infection nor to guide or monitor treatment for MRSA infections.      Studies:  Recent x-ray studies have been reviewed in detail by the Attending Physician  Scheduled Meds:  Scheduled Meds: . aspirin EC  81 mg Oral Daily  . atorvastatin  20 mg Oral QHS  . ciprofloxacin  400 mg Intravenous Q24H  . gabapentin  100 mg Oral QHS  . heparin subcutaneous  5,000 Units Subcutaneous 3 times per day  . [START ON 05/06/2014] Influenza vac split quadrivalent PF  0.5 mL Intramuscular Tomorrow-1000  . insulin aspart  0-9 Units Subcutaneous TID WC  . metronidazole  500 mg Intravenous Q8H  .  midodrine      . midodrine  10 mg Oral TID WC    Time spent on care of this patient: 40 mins   Allie Bossier , MD   [facility] Office  [phone] Pager [phone]  On-Call/Text Page:      [email or web address]      password [email or web address]  If 7PM-7AM, please contact night-coverage [email or web address] Password [email or web address] 05/05/2014, 3:19 PM   LOS: 2 days

## 2014-05-05 NOTE — Progress Notes (Signed)
Subjective: Pt with pain stable   Objective: Vital signs in last 24 hours: Temp:  [97.8 F (36.6 C)-99.7 F (37.6 C)] 98.1 F (36.7 C) (11/24 0700) Pulse Rate:  [69-82] 69 (11/23 2334) Resp:  [14-16] 16 (11/24 0350) BP: (110-129)/(45-72) 114/63 mmHg (11/24 0700) SpO2:  [92 %-98 %] 95 % (11/24 0350) Weight:  [166 lb 10.7 oz (75.6 kg)] 166 lb 10.7 oz (75.6 kg) (11/24 0340)    Intake/Output from previous day: 11/23 0701 - 11/24 0700 In: 610 [P.O.:60; NG/GT:50; IV Piggyback:500] Out: 800 [Emesis/NG output:800] Intake/Output this shift:    General appearance: alert and cooperative Resp: clear to auscultation bilaterally Cardio: regular rate and rhythm, S1, S2 normal, no murmur, click, rub or gallop GI: soft, ttp RLQ, no RUQ ttp, active Bs  Lab Results:   Recent Labs  05/04/14 0347 05/05/14 0318  WBC 14.1* 11.7*  HGB 10.5* 12.3*  HCT 34.5* 40.2  PLT 240 276   BMET  Recent Labs  05/04/14 0347 05/05/14 0318  NA 135* 133*  K 4.5 4.5  CL 96 91*  CO2 20 18*  GLUCOSE 72 58*  BUN 35* 43*  CREATININE 4.68* 5.83*  CALCIUM 9.0 9.1   Studies/Results: Ct Abdomen Pelvis W Contrast  05/03/2014   CLINICAL DATA:  Right lower quadrant pain for several days.  EXAM: CT ABDOMEN AND PELVIS WITH CONTRAST  TECHNIQUE: Multidetector CT imaging of the abdomen and pelvis was performed using the standard protocol following bolus administration of intravenous contrast.  CONTRAST:  127mL OMNIPAQUE IOHEXOL 300 MG/ML  SOLN  COMPARISON:  02/03/2014 and 12/09/2011  FINDINGS: Lung bases demonstrate subtle linear/heterogeneous density over the posterior bases likely atelectasis. There is moderate cardiomegaly with left main and 3 vessel atherosclerotic coronary artery disease.  Abdominal images demonstrate mild diffuse hepatic steatosis the spleen, pancreas and adrenal glands are within normal. Again noted is mild cholelithiasis. There is mild gallbladder wall thickening versus minimal  pericholecystic fluid. Kidneys are somewhat small with numerous cysts compatible patient's medical renal disease/dialysis. There is a sub cm exophytic indeterminate left renal cortical nodule over the mid to lower pole unchanged from the recent prior exam. Multiple renal vascular calcifications are evident. Ureters are unremarkable. There is moderate calcified plaque of the abdominal aorta and iliac arteries.  There is evidence of patient's previous colon surgery involving the ascending colon near the cecum. There is wall thickening of this segment of colon adjacent the anastomosis. Minimal stranding of the adjacent pericolonic fat. The anastomosis is otherwise patent. There is minimal wall thickening of the small bowel loop immediately proximal to the anastomosis. There are several mildly prominent/borderline dilated small bowel loops which may be due to ileus versus partial obstructive effect at the anastomosis due to the inflamed colon.  Posterior changes of the midline anterior abdominal wall. Small incisional hernia near the umbilicus containing only peritoneal fat.  Pelvic images demonstrate a decompressed bladder. The prostate and rectum are within normal. Bony structures demonstrate continued evidence of renal osteodystrophy. Degenerative changes of the hips and spine. Multiple Schmorl's nodes throughout the spine.  IMPRESSION: Wall thickening with mild adjacent inflammatory change involving the colon adjacent the anastomosis in the right lower quadrant compatible with acute colitis. Associated colon to small bowel anastomosis appears to be patent with minimal wall thickening of the small bowel immediately proximal to the anastomosis along with several borderline dilated more proximal small bowel loops likely secondary to ileus versus partial obstructing effect at the anastomosis due to the inflamed colon.  Cholelithiasis with evidence of mild gallbladder wall thickening versus adjacent pericholecystic  fluid. Right upper quadrant ultrasound may be helpful for further evaluation.  Somewhat small kidneys with numerous cysts unchanged. Indeterminate subcentimeter exophytic left renal nodule unchanged from the recent prior exam. Recommend CT in 6 months.  This recommendation follows ACR consensus guidelines: Managing Incidental Findings on Abdominal CT: White Paper of the ACR Incidental Findings Committee. J Am Coll Radiol 2010;7:754-773  Small incisional hernia adjacent the umbilicus containing only peritoneal fat.  Cardiomegaly with left main and 3 vessel atherosclerotic coronary artery disease. Mild bibasilar atelectasis.  Mild hepatic steatosis.   Electronically Signed   By: Marin Olp M.D.   On: 05/03/2014 11:04   Dg Chest Port 1 View  05/03/2014   CLINICAL DATA:  Encounter for NG tube placement.  EXAM: PORTABLE CHEST - 1 VIEW  COMPARISON:  Radiograph 03/31/2014  FINDINGS: Interval placement of NG tube the tip of which extends beyond the lower margin of the film in the course of the stomach. Stable enlarged cardiac silhouette. There is central venous pulmonary congestion. Bibasilar atelectasis.  IMPRESSION: NG tube extends into the stomach and beyond the lower margin of film.   Electronically Signed   By: Suzy Bouchard M.D.   On: 05/03/2014 18:37    Anti-infectives: Anti-infectives    Start     Dose/Rate Route Frequency Ordered Stop   05/04/14 1500  ciprofloxacin (CIPRO) IVPB 400 mg     400 mg200 mL/hr over 60 Minutes Intravenous Every 24 hours 05/03/14 1341     05/03/14 2000  metroNIDAZOLE (FLAGYL) IVPB 500 mg     500 mg100 mL/hr over 60 Minutes Intravenous Every 8 hours 05/03/14 1511     05/03/14 1600  metroNIDAZOLE (FLAGYL) IVPB 500 mg  Status:  Discontinued     500 mg100 mL/hr over 60 Minutes Intravenous Every 8 hours 05/03/14 1554 05/03/14 1647   05/03/14 1400  ciprofloxacin (CIPRO) IVPB 400 mg     400 mg200 mL/hr over 60 Minutes Intravenous  Once 05/03/14 1340 05/03/14 1538    05/03/14 1130  metroNIDAZOLE (FLAGYL) IVPB 500 mg     500 mg100 mL/hr over 60 Minutes Intravenous  Once 05/03/14 1126 05/03/14 1343      Assessment/Plan: Ischemic colitis RLQ - WBC con't to improve.  Will trial NGT clamping trial  Cholelithiasis - recent perc chole tube was removed after F/U cholangiogram showed patent cystic duct. No RUQ tenderness at this time so doubt this is a current issue  LOS: 2 days    Rosario Jacks., Anne Hahn 05/05/2014

## 2014-05-05 NOTE — Plan of Care (Signed)
Problem: Phase II Progression Outcomes Goal: IV changed to normal saline lock Outcome: Completed/Met Date Met:  05/05/14 Patient saline locked

## 2014-05-05 NOTE — Progress Notes (Signed)
[facility] Rounding Note Subjective:  Still feels nauseous despite NG tube Still with RLQ pain but maybe a little better No CP or SOB Stump a little sore BP's generally now in one teens to 120's - on midodrine  Objective Vital signs in last 24 hours: Filed Vitals:   05/04/14 2334 05/05/14 0340 05/05/14 0350 05/05/14 0700  BP: 129/72  116/45 114/63  Pulse: 69     Temp: 98 F (36.7 C)  97.8 F (36.6 C) 98.1 F (36.7 C)  TempSrc: Oral  Oral Oral  Resp: 16  16   Height:      Weight:  75.6 kg (166 lb 10.7 oz)    SpO2: 95%  95%    Weight change: -0.242 kg (-8.5 oz)  Intake/Output Summary (Last 24 hours) at 05/05/14 0923 Last data filed at 05/05/14 0500  Gross per 24 hour  Intake    610 ml  Output    800 ml  Net   -190 ml   Physical Exam:  BP 114/63 mmHg  Pulse 69  Temp(Src) 98.1 F (36.7 C) (Oral)  Resp 16  Ht 5\' 5"  (1.651 m)  Wt 75.6 kg (166 lb 10.7 oz)  BMI 27.73 kg/m2  SpO2 95%   Weight 75.6 Outpt EDW 74 Very nice older BM. NG in place. NAD. Pleasant, cooperative, and more awake than yesterday No JVD Lungs clear anteriorly Regular rhythm S1S2 No S3 Soft 1/6 murmur USB Abd + BS. Non-distended. + tenderness to palpation RLQ RLE in immobilizing padded device. No discernible edema Left AKA stump with areas of non-healing medial and lateral aspect of the incision but no purulence or drainage Left FA AVG with + bruit and thrill  Labs: Basic Metabolic Panel:  Recent Labs Lab 05/03/14 0805 05/04/14 0347 05/05/14 0318  NA 137 135* 133*  K 3.9 4.5 4.5  CL 95* 96 91*  CO2 25 20 18*  GLUCOSE 171* 72 58*  BUN 28* 35* 43*  CREATININE 3.83* 4.68* 5.83*  CALCIUM 9.3 9.0 9.1  PHOS  --   --  4.9*    Recent Labs Lab 05/03/14 0805 05/04/14 0347 05/05/14 0318  AST 76* 65*  --   ALT 15 14  --   ALKPHOS 109 105  --   BILITOT 0.5 0.5  --   PROT 7.9 7.1  --   ALBUMIN 2.5* 2.1* 2.2*    Recent Labs Lab 05/03/14 0805  LIPASE 25    Recent  Labs Lab 05/03/14 0805 05/04/14 0347 05/05/14 0318  WBC 18.0* 14.1* 11.7*  NEUTROABS 15.0*  --   --   HGB 11.4* 10.5* 12.3*  HCT 38.1* 34.5* 40.2  MCV 86.2 82.9 85.0  PLT 270 240 276    Recent Labs Lab 05/04/14 2009 05/04/14 2330 05/05/14 0349 05/05/14 0417 05/05/14 0722  GLUCAP 80 74 63* 101* 73    Studies/Results: Ct Abdomen Pelvis W Contrast  05/03/2014   CLINICAL DATA:  Right lower quadrant pain for several days.  EXAM: CT ABDOMEN AND PELVIS WITH CONTRAST  TECHNIQUE: Multidetector CT imaging of the abdomen and pelvis was performed using the standard protocol following bolus administration of intravenous contrast.  CONTRAST:  178mL OMNIPAQUE IOHEXOL 300 MG/ML  SOLN  COMPARISON:  02/03/2014 and 12/09/2011  FINDINGS: Lung bases demonstrate subtle linear/heterogeneous density over the posterior bases likely atelectasis. There is moderate cardiomegaly with left main and 3 vessel atherosclerotic coronary artery disease.  Abdominal images demonstrate mild diffuse hepatic steatosis the spleen, pancreas and adrenal  glands are within normal. Again noted is mild cholelithiasis. There is mild gallbladder wall thickening versus minimal pericholecystic fluid. Kidneys are somewhat small with numerous cysts compatible patient's medical renal disease/dialysis. There is a sub cm exophytic indeterminate left renal cortical nodule over the mid to lower pole unchanged from the recent prior exam. Multiple renal vascular calcifications are evident. Ureters are unremarkable. There is moderate calcified plaque of the abdominal aorta and iliac arteries.  There is evidence of patient's previous colon surgery involving the ascending colon near the cecum. There is wall thickening of this segment of colon adjacent the anastomosis. Minimal stranding of the adjacent pericolonic fat. The anastomosis is otherwise patent. There is minimal wall thickening of the small bowel loop immediately proximal to the anastomosis.  There are several mildly prominent/borderline dilated small bowel loops which may be due to ileus versus partial obstructive effect at the anastomosis due to the inflamed colon.  Posterior changes of the midline anterior abdominal wall. Small incisional hernia near the umbilicus containing only peritoneal fat.  Pelvic images demonstrate a decompressed bladder. The prostate and rectum are within normal. Bony structures demonstrate continued evidence of renal osteodystrophy. Degenerative changes of the hips and spine. Multiple Schmorl's nodes throughout the spine.  IMPRESSION: Wall thickening with mild adjacent inflammatory change involving the colon adjacent the anastomosis in the right lower quadrant compatible with acute colitis. Associated colon to small bowel anastomosis appears to be patent with minimal wall thickening of the small bowel immediately proximal to the anastomosis along with several borderline dilated more proximal small bowel loops likely secondary to ileus versus partial obstructing effect at the anastomosis due to the inflamed colon.  Cholelithiasis with evidence of mild gallbladder wall thickening versus adjacent pericholecystic fluid. Right upper quadrant ultrasound may be helpful for further evaluation.  Somewhat small kidneys with numerous cysts unchanged. Indeterminate subcentimeter exophytic left renal nodule unchanged from the recent prior exam. Recommend CT in 6 months.  This recommendation follows ACR consensus guidelines: Managing Incidental Findings on Abdominal CT: White Paper of the ACR Incidental Findings Committee. J Am Coll Radiol 2010;7:754-773  Small incisional hernia adjacent the umbilicus containing only peritoneal fat.  Cardiomegaly with left main and 3 vessel atherosclerotic coronary artery disease. Mild bibasilar atelectasis.  Mild hepatic steatosis.   Electronically Signed   By: Marin Olp M.D.   On: 05/03/2014 11:04   Dg Chest Port 1 View  05/03/2014   CLINICAL  DATA:  Encounter for NG tube placement.  EXAM: PORTABLE CHEST - 1 VIEW  COMPARISON:  Radiograph 03/31/2014  FINDINGS: Interval placement of NG tube the tip of which extends beyond the lower margin of the film in the course of the stomach. Stable enlarged cardiac silhouette. There is central venous pulmonary congestion. Bibasilar atelectasis.  IMPRESSION: NG tube extends into the stomach and beyond the lower margin of film.   Electronically Signed   By: Suzy Bouchard M.D.   On: 05/03/2014 18:37   Medications:   . aspirin EC  81 mg Oral Daily  . atorvastatin  20 mg Oral QHS  . ciprofloxacin  400 mg Intravenous Q24H  . gabapentin  100 mg Oral QHS  . heparin subcutaneous  5,000 Units Subcutaneous 3 times per day  . insulin aspart  0-9 Units Subcutaneous TID WC  . metronidazole  500 mg Intravenous Q8H  . midodrine  10 mg Oral TID WC    Dialysis Orders: MWF @ [place] 4hrs 74 kg 2K/2Ca 400/800 Profile 4 Heparin 5000 U AVG @  LFA  No Hectorol Aranesp 160 mcg on Thurs No Venofer  Assessment/Plan: 1. RLQ abdominal pain - d/t ischemic colitis, with NG tube, symptomatically better. White count continues to fall. On IV cipro and flagyl. Surgery plans trial clamp NG today 2. ESRD - HD on MWF @ [place] Only 46 mins of HD on 11/22. Last full treatment was 11/20. Will HD today - no heparin - no volume off to avoid hypotension 3. Hypotension - improved. Was started on midodrine on admission (was not on as outpt that we can tell but probably should have been, just reviewing his outpt BP's at dialysis which are chronically on the low side).  Will want to definitely continue this at discharge. 4. Volume -  last reached EDW on 11/13. Probably needs higher EDW. Reassess this admission. 5. Anemia - Hgb stable and above goal. Gets 160 of Aranesp a week at outpt unit - hold for right now (last Hb over 12 but admittedly has varied some this adm) 6. Metabolic bone  disease - Last phos 4.9. Last outpt iPTH 334 (parathyroidectomy in 2011); no binders at present. 7. Nutrition - Alb 2.1, NPO, vitamin. 8. Cholecystitis - in 01/2014, s/p perc chole tube, removed after cholangiogram showed patent cystic duct. Not an inssue currently 9. DM Type 2 - diet controlled, last Hgb A1C 6.5. 10. Hx CAD - evaluated for chest pain 7/4-12/2013, LBBB per EKG, negative stress test, but decreasing EF, refused cardiac cath. 11. Hx CVA - most recently in 09/2011, also in 2000. 12. Left AKA - stump still not entirely healed. Prior wound VAC. Dressing changes.   Jamal Maes, MD [facility] [phone] pager 05/05/2014, 9:23 AM

## 2014-05-05 NOTE — Progress Notes (Signed)
Pt confused baseline, but now refusing meds. Called son, who plans to visit shortly. Will attempt med admin again at that time.  Will continue to monitor.

## 2014-05-06 LAB — CBC
HEMATOCRIT: 38 % — AB (ref 39.0–52.0)
Hemoglobin: 11.5 g/dL — ABNORMAL LOW (ref 13.0–17.0)
MCH: 25 pg — ABNORMAL LOW (ref 26.0–34.0)
MCHC: 30.3 g/dL (ref 30.0–36.0)
MCV: 82.6 fL (ref 78.0–100.0)
Platelets: 279 10*3/uL (ref 150–400)
RBC: 4.6 MIL/uL (ref 4.22–5.81)
RDW: 19.2 % — ABNORMAL HIGH (ref 11.5–15.5)
WBC: 8 10*3/uL (ref 4.0–10.5)

## 2014-05-06 LAB — RENAL FUNCTION PANEL
ALBUMIN: 2.1 g/dL — AB (ref 3.5–5.2)
ANION GAP: 18 — AB (ref 5–15)
BUN: 20 mg/dL (ref 6–23)
CHLORIDE: 96 meq/L (ref 96–112)
CO2: 22 meq/L (ref 19–32)
CREATININE: 3.63 mg/dL — AB (ref 0.50–1.35)
Calcium: 8.3 mg/dL — ABNORMAL LOW (ref 8.4–10.5)
GFR calc Af Amer: 17 mL/min — ABNORMAL LOW (ref >90)
GFR calc non Af Amer: 15 mL/min — ABNORMAL LOW (ref >90)
Glucose, Bld: 64 mg/dL — ABNORMAL LOW (ref 70–99)
POTASSIUM: 4.6 meq/L (ref 3.7–5.3)
Phosphorus: 3.3 mg/dL (ref 2.3–4.6)
Sodium: 136 mEq/L — ABNORMAL LOW (ref 137–147)

## 2014-05-06 LAB — GLUCOSE, CAPILLARY
GLUCOSE-CAPILLARY: 103 mg/dL — AB (ref 70–99)
GLUCOSE-CAPILLARY: 72 mg/dL (ref 70–99)
GLUCOSE-CAPILLARY: 76 mg/dL (ref 70–99)
GLUCOSE-CAPILLARY: 88 mg/dL (ref 70–99)
Glucose-Capillary: 67 mg/dL — ABNORMAL LOW (ref 70–99)
Glucose-Capillary: 111 mg/dL — ABNORMAL HIGH (ref 70–99)
Glucose-Capillary: 96 mg/dL (ref 70–99)
Glucose-Capillary: 82 mg/dL (ref 70–99)

## 2014-05-06 MED ORDER — DEXTROSE 50 % IV SOLN
INTRAVENOUS | Status: AC
  Administered 2014-05-06: 25 mL
  Filled 2014-05-06: qty 50

## 2014-05-06 MED ORDER — PRO-STAT SUGAR FREE PO LIQD
30.0000 mL | Freq: Two times a day (BID) | ORAL | Status: DC
  Administered 2014-05-07 – 2014-05-08 (×3): 30 mL via ORAL
  Filled 2014-05-06 (×4): qty 30

## 2014-05-06 MED ORDER — QUETIAPINE 12.5 MG HALF TABLET
12.5000 mg | ORAL_TABLET | Freq: Every day | ORAL | Status: DC
  Administered 2014-05-06 – 2014-05-07 (×2): 12.5 mg via ORAL
  Filled 2014-05-06 (×3): qty 1

## 2014-05-06 MED ORDER — QUETIAPINE FUMARATE 25 MG PO TABS
25.0000 mg | ORAL_TABLET | Freq: Every day | ORAL | Status: DC
  Filled 2014-05-06: qty 1

## 2014-05-06 NOTE — Progress Notes (Signed)
Medicare Important Message given? YES  (If response is "NO", the following Medicare IM given date fields will be blank)  Date Medicare IM given: 05/06/14 Medicare IM given by:  Emeterio Balke  

## 2014-05-06 NOTE — Progress Notes (Signed)
Hypoglycemic Event  CBG: 67  Treatment: D50 IV 25 mL  Symptoms: None  Follow-up CBG: Time1013 CBG Result:111  Possible Reasons for Event: Inadequate meal intake  Comments/MD notified:    Suezanne Cheshire  Remember to initiate Hypoglycemia Order Set & complete

## 2014-05-06 NOTE — Progress Notes (Signed)
INITIAL NUTRITION ASSESSMENT  Pt meets criteria for NON-SEVERE (MODERATE) MALNUTRITION in the context of chronic illness as evidenced by a 13.6% weight loss in 4 months and mild to moderate fat and muscle mass depletion.  DOCUMENTATION CODES Per approved criteria  -Non-severe (moderate) malnutrition in the context of chronic illness   INTERVENTION: Provide 30 ml Prostat po BID, each supplement provides 100 kcal and 15 grams of protein.  Encourage PO intake.  NUTRITION DIAGNOSIS: Increased nutrient needs related to chronic illness as evidenced by estimated nutrition needs.   Goal: Pt to meet >/= 90% of their estimated nutrition needs   Monitor:  PO intake, weight trends, labs, I/O's  Reason for Assessment: MD consult for assessment of nutrition requirements/status  78 y.o. male  Admitting Dx: Colitis Ischemic  ASSESSMENT: Pt with PMH of ESRD, PVD s/p L AKA, DM, Dementia, h/o ischemic colitis s/p colon resection in 2011, resident of SNF started having abd pain and sent to the ER.  NG tube has been discontinued. Abdominal pains has improved. Meal completion has been 50%. Pt reports his appetite is fine currently. Pt reports he usually drinks [employer] and [employer], however has stopped as he reports it has been giving him diarrhea. Pt will benefit from extra protein, thus RD will order Prostat, as it is suitable for lactose intolerance. Pt repot having lost weight due to not eating well over the past couple of months reporting he would only eat 2 meals a day. Pt reports however more recently he has started back to eating 3 meals a day. Noted pt with a 13.6% weight loss in 4 months. Pt was encouraged to eat his food at meals.  Nutrition Focused Physical Exam:  Subcutaneous Fat:  Orbital Region: N/A Upper Arm Region: Moderate depletion Thoracic and Lumbar Region: WNL  Muscle:  Temple Region: Moderate depletion Clavicle Bone Region: WNL Clavicle and Acromion Bone Region:  WNL Scapular Bone Region: N/A Dorsal Hand: WNL Patellar Region: Mild depletion Anterior Thigh Region: Mild depletion Posterior Calf Region: Mild depletion  Edema: none  Height: Ht Readings from Last 1 Encounters:  05/03/14 5\' 5"  (1.651 m)    Weight: Wt Readings from Last 1 Encounters:  05/05/14 164 lb 0.4 oz (74.4 kg)    Ideal Body Weight: 136 lbs  % Ideal Body Weight: 121%  Wt Readings from Last 10 Encounters:  05/05/14 164 lb 0.4 oz (74.4 kg)  04/28/14 164 lb 12.8 oz (74.753 kg)  04/23/14 164 lb (74.39 kg)  04/07/14 164 lb (74.39 kg)  04/02/14 158 lb 11.7 oz (72 kg)  03/26/14 170 lb (77.111 kg)  03/19/14 178 lb (80.74 kg)  03/04/14 178 lb (80.74 kg)  02/09/14 178 lb 5.6 oz (80.9 kg)  01/21/14 181 lb 14.1 oz (82.5 kg)    Usual Body Weight: 168 lbs  % Usual Body Weight: 98%  BMI:  Body mass index is 27.29 kg/(m^2).  Estimated Nutritional Needs: Kcal: 2000-2200 Protein: 90-100 grams Fluid: Per MD  Skin: Stage II pressure ulcer on buttocks  Diet Order: Diet full liquid  EDUCATION NEEDS: -No education needs identified at this time   Intake/Output Summary (Last 24 hours) at 05/06/14 1614 Last data filed at 05/06/14 1117  Gross per 24 hour  Intake    460 ml  Output      0 ml  Net    460 ml    Last BM: 11/25  Labs:   Recent Labs Lab 05/04/14 0347 05/05/14 0318 05/06/14 0344  NA 135* 133*  136*  K 4.5 4.5 4.6  CL 96 91* 96  CO2 20 18* 22  BUN 35* 43* 20  CREATININE 4.68* 5.83* 3.63*  CALCIUM 9.0 9.1 8.3*  PHOS  --  4.9* 3.3  GLUCOSE 72 58* 64*    CBG (last 3)   Recent Labs  05/06/14 0824 05/06/14 1013 05/06/14 1309  GLUCAP 67* 111* 96    Scheduled Meds: . aspirin EC  81 mg Oral Daily  . atorvastatin  20 mg Oral QHS  . ciprofloxacin  400 mg Intravenous Q24H  . gabapentin  100 mg Oral QHS  . heparin subcutaneous  5,000 Units Subcutaneous 3 times per day  . Influenza vac split quadrivalent PF  0.5 mL Intramuscular Tomorrow-1000   . insulin aspart  0-9 Units Subcutaneous TID WC  . metronidazole  500 mg Intravenous Q8H  . midodrine  10 mg Oral TID WC  . QUEtiapine  25 mg Oral QHS    Continuous Infusions:   Past Medical History  Diagnosis Date  . Hyperparathyroidism   . Arthritis   . Hyperlipidemia   . Hypertension   . Neuromuscular disorder   . Stroke   . GERD (gastroesophageal reflux disease)   . Migraine   . Heart attack 2011; 1990's  . Peripheral neuropathy   . Anxiety   . Depression   . Dialysis patient     M, W, F; [place] CAD (coronary artery disease)     a. s/p NSTEMI and failed RCA PCI in 2011. b. Nuc 12/2013: nonischemic, prior inferior MI.  Marland Kitchen LBBB (left bundle branch block)   . Calciphylaxis     a. Multiple admissions for nonhealing L ulcer/sepsis, s/p L AKA 02/02/2014.  Marland Kitchen Chronic anemia   . Dementia   . Type II diabetes mellitus     not taking any medications.  . ESRD (end stage renal disease) on dialysis   . Renal insufficiency     Past Surgical History  Procedure Laterality Date  . Colon surgery  2011    colon resection   . Parathyroidectomy  2011  . Capd insertion  09/12/2011    Procedure: LAPAROSCOPIC INSERTION CONTINUOUS AMBULATORY PERITONEAL DIALYSIS  (CAPD) CATHETER;  Surgeon: Edward Jolly, MD;  Location: [place];  Service: General;  Laterality: N/A;  . Sp av dialysis shunt access existing *l*  2003  . Dg av dialysis shunt intro ndl*r* or      "not working" (09/19/11)  . Capd removal  03/12/2012    Procedure: CONTINUOUS AMBULATORY PERITONEAL DIALYSIS  (CAPD) CATHETER REMOVAL;  Surgeon: Edward Jolly, MD;  Location: [place];  Service: General;  Laterality: N/A;  . Amputation Left 02/02/2014    Procedure: AMPUTATION ABOVE KNEE-LEFT;  Surgeon: Elam Dutch, MD;  Location: [place];  Service: Vascular;  Laterality: Left;  . I&d extremity Left 03/31/2014    Procedure: IRRIGATION AND DEBRIDEMENT EXTREMITY - LEFT ABOVE KNEE AMPUTATION;  Surgeon: Elam Dutch,  MD;  Location: [place];  Service: Vascular;  Laterality: Left;  . Application of wound vac Left 03/31/2014    Procedure: APPLICATION OF WOUND VAC - LEFT AKA;  Surgeon: Elam Dutch, MD;  Location: [place];  Service: Vascular;  Laterality: Left;    Kallie Locks, MS, RD, LDN Pager # [phone] After hours/ weekend pager # [phone]

## 2014-05-06 NOTE — Progress Notes (Signed)
  Subjective: PT doing well Pain better No n/v  Objective: Vital signs in last 24 hours: Temp:  [97.1 F (36.2 C)-100.1 F (37.8 C)] 98.7 F (37.1 C) (11/25 0312) Pulse Rate:  [54-86] 86 (11/25 0312) Resp:  [16-22] 16 (11/25 0312) BP: (104-121)/(52-80) 113/56 mmHg (11/25 0312) SpO2:  [94 %-100 %] 94 % (11/25 0312) Weight:  [164 lb 0.4 oz (74.4 kg)-165 lb 9.1 oz (75.1 kg)] 164 lb 0.4 oz (74.4 kg) (11/24 1424) Last BM Date: 05/05/14  Intake/Output from previous day: 11/24 0701 - 11/25 0700 In: 120 [NG/GT:20; IV Piggyback:100] Out: 30  Intake/Output this shift:    General appearance: alert and cooperative Resp: clear to auscultation bilaterally Cardio: regular rate and rhythm, S1, S2 normal, no murmur, click, rub or gallop GI: soft, min ttp RLQ, active BS  Lab Results:   Recent Labs  05/05/14 0318 05/06/14 0344  WBC 11.7* 8.0  HGB 12.3* 11.5*  HCT 40.2 38.0*  PLT 276 279   BMET  Recent Labs  05/05/14 0318 05/06/14 0344  NA 133* 136*  K 4.5 4.6  CL 91* 96  CO2 18* 22  GLUCOSE 58* 64*  BUN 43* 20  CREATININE 5.83* 3.63*  CALCIUM 9.1 8.3*   PT/INR No results for input(s): LABPROT, INR in the last 72 hours. ABG No results for input(s): PHART, HCO3 in the last 72 hours.  Invalid input(s): PCO2, PO2  Studies/Results: US Abdomen Limited Ruq  05/05/2014   CLINICAL DATA:  Cholelithiasis  EXAM: US ABDOMEN LIMITED - RIGHT UPPER QUADRANT  COMPARISON:  CT abdomen 05/03/2014  FINDINGS: Gallbladder:  Gallbladder sludge with small cholelithiasis. No pericholecystic fluid. Top normal gallbladder wall thickness. Mild gallbladder distention. Negative sonographic Murphy's sign.  Common bile duct:  Diameter: 7.8 mm  Liver:  No focal hepatic lesion. Small amount of perihepatic fluid. Heterogeneous echotexture. No intrahepatic biliary ductal dilatation.  IMPRESSION: 1. Gallbladder sludge with cholelithiasis with top normal gallbladder wall thickening and mild gallbladder  distention. These findings may be reactive secondary to adjacent inflammatory changes or hepatocellular disease versus secondary to cholecystitis. 2. Heterogeneous hepatic echotexture and trace perihepatic fluid as can be seen with hepatocellular disease.   Electronically Signed   By: Kathreen Devoid   On: 05/05/2014 21:55    Anti-infectives: Anti-infectives    Start     Dose/Rate Route Frequency Ordered Stop   05/04/14 1500  ciprofloxacin (CIPRO) IVPB 400 mg     400 mg200 mL/hr over 60 Minutes Intravenous Every 24 hours 05/03/14 1341     05/03/14 2000  metroNIDAZOLE (FLAGYL) IVPB 500 mg     500 mg100 mL/hr over 60 Minutes Intravenous Every 8 hours 05/03/14 1511     05/03/14 1600  metroNIDAZOLE (FLAGYL) IVPB 500 mg  Status:  Discontinued     500 mg100 mL/hr over 60 Minutes Intravenous Every 8 hours 05/03/14 1554 05/03/14 1647   05/03/14 1400  ciprofloxacin (CIPRO) IVPB 400 mg     400 mg200 mL/hr over 60 Minutes Intravenous  Once 05/03/14 1340 05/03/14 1538   05/03/14 1130  metroNIDAZOLE (FLAGYL) IVPB 500 mg     500 mg100 mL/hr over 60 Minutes Intravenous  Once 05/03/14 1126 05/03/14 1343      Assessment/Plan: Ischemic colitis   Advance diet to CLD DC ngt   LOS: 3 days    Rosario Jacks., [facility] 05/06/2014

## 2014-05-06 NOTE — Clinical Social Work Note (Signed)
CSW reviewed chart. Once pt is medically stable he can return back to [facility]. CSW will continue to follow and assist with needs.   [place], MSW, [place]

## 2014-05-06 NOTE — Progress Notes (Signed)
Patient has refused PO medications all shift. Pt extremely confused and disoriented to time, place and situation. Towards the late morning patient became physically and verbally aggressive when this RN wanted to check his blood glucose level. Patient was swinging his arms and bed control at staff. This RN obtained the help from [place], [place] in order to be able to check patients CBG, which was within range. Will continue to monitor patient.

## 2014-05-06 NOTE — Progress Notes (Signed)
[facility]  Sean [name] YQM:[id number] DOB: [birth year] DOA: 05/03/2014 PCP: No primary care provider on file.  Brief narrative: 78 yo male w/ hx of chronic kidney disease on dialysis, peripheral vascular disease status post left AKA, diabetes, dementia, history of prior ischemic colitis requiring colon resection 2011. He resides at a skilled nursing facility. On the date of admission he was undergoing dialysis when he developed abdominal pain and was sent to the emergency department.  In the ER he was found to be hypotensive with a systolic blood pressure in the 70s. Critical care medicine was called and advised to rehydrate the patient and admit to stepdown. At time of admission patient reported right-sided abdominal pain. No apparent nausea vomiting, diarrhea fevers or chills. White count was 18,000 at admission, lactic acid was elevated at 2.54. Hemoglobin 11.4. Gen Surgery was consulted.  Patient symptoms have improved with conservative medical management. His NG tube has been discontinued and diet is being advanced. Patient had hypotension during dialysis and after admission. Nephrology suspects this will continue as a chronic problem and recommends continuing midodrine after discharge.  HPI/Subjective: Alert; refusing flu shot, states abdominal pain much better  Assessment/Plan:  Colitis, ischemic / PSBO -History of episode in 2011 requiring partial colectomy -CT confirmed diagnosis of colitis -suspect hypotension during dialysis contributory -On empiric abx to prevent secondary infection - discontinue  -Appreciate general surgery assistance; conservative medical management -NGT dc'd now with slow diet advancement  Dementia with acute nocturnal delirium -became belligerent and agitated last pm with improvement by am -suspect Sundowning  -begin Seroquel 12.5 mg HS  Hypotension -started on midodrine this admission - Nephro rec continue  after dc  End stage renal disease on dialysis -Nephrology following   Cholelithiasis -[place] with GB sludge and high normal GB wall thickening and mild GB distention (pt was NPO with NGT so this explains those findings) -monitor for biliary colic sx's with diet advance  Diabetes mellitus  -Current CBGs well-controlled -Cont SSI -Was not on medications prior to admission -Hemoglobin A1c in August was 5.9  Malnutrition of moderate degree -Consult nutritional services   Atherosclerotic PVD with ulceration/S/P AKA unilateral -PT/OT evaluations when hemodynamically stable -Wound slowly healing  DVT prophylaxis: Lovenox Code Status: Full Family Communication: No family at bedside Disposition Plan/Expected LOS: Transfer to Floor - slowly advance diet - d/c home when able to tolerate regular diet, and BP better controlled - PT/OT to eval    Consultants: Nephrology/ Dr. Lorrene Reid General surgery/ Dr. Grandville Silos  Procedures: None   Antibiotics: Cipro 11/22 > 11/25 Flagyl 11/22 > 11/25  Objective: Blood pressure 130/78, pulse 73, temperature 98.3 F (36.8 C), temperature source Oral, resp. rate 18, height 5\' 5"  (1.651 m), weight 164 lb 0.4 oz (74.4 kg), SpO2 100 %.  Intake/Output Summary (Last 24 hours) at 05/06/14 1441 Last data filed at 05/06/14 1117  Gross per 24 hour  Intake    460 ml  Output      0 ml  Net    460 ml   Exam: Gen: No acute respiratory distress Chest: Clear to auscultation bilaterally without wheezes, rhonchi or crackles, room air Cardiac: Regular rate and rhythm, no rubs murmurs or gallops, no peripheral edema Abdomen: Soft - tender in right lower quadrant, slightly distended - hypoactive bowel sounds, no obvious hepatosplenomegaly, no ascites Extremities: prior left AKA - no cyanosis, clubbing or edema  Scheduled Meds:  Scheduled Meds: . aspirin EC  81  mg Oral Daily  . atorvastatin  20 mg Oral QHS  . ciprofloxacin  400 mg Intravenous Q24H  . gabapentin   100 mg Oral QHS  . heparin subcutaneous  5,000 Units Subcutaneous 3 times per day  . Influenza vac split quadrivalent PF  0.5 mL Intramuscular Tomorrow-1000  . insulin aspart  0-9 Units Subcutaneous TID WC  . metronidazole  500 mg Intravenous Q8H  . midodrine  10 mg Oral TID WC  . QUEtiapine  25 mg Oral QHS   Data Reviewed: Basic Metabolic Panel:  Recent Labs Lab 05/03/14 0805 05/04/14 0347 05/05/14 0318 05/06/14 0344  NA 137 135* 133* 136*  K 3.9 4.5 4.5 4.6  CL 95* 96 91* 96  CO2 25 20 18* 22  GLUCOSE 171* 72 58* 64*  BUN 28* 35* 43* 20  CREATININE 3.83* 4.68* 5.83* 3.63*  CALCIUM 9.3 9.0 9.1 8.3*  PHOS  --   --  4.9* 3.3   Liver Function Tests:  Recent Labs Lab 05/03/14 0805 05/04/14 0347 05/05/14 0318 05/06/14 0344  AST 76* 65*  --   --   ALT 15 14  --   --   ALKPHOS 109 105  --   --   BILITOT 0.5 0.5  --   --   PROT 7.9 7.1  --   --   ALBUMIN 2.5* 2.1* 2.2* 2.1*    Recent Labs Lab 05/03/14 0805  LIPASE 25   CBC:  Recent Labs Lab 05/03/14 0805 05/04/14 0347 05/05/14 0318 05/06/14 0344  WBC 18.0* 14.1* 11.7* 8.0  NEUTROABS 15.0*  --   --   --   HGB 11.4* 10.5* 12.3* 11.5*  HCT 38.1* 34.5* 40.2 38.0*  MCV 86.2 82.9 85.0 82.6  PLT 270 240 276 279   CBG:  Recent Labs Lab 05/06/14 0310 05/06/14 0602 05/06/14 0824 05/06/14 1013 05/06/14 1309  GLUCAP 72 76 67* 111* 96    Recent Results (from the past 240 hour(s))  MRSA PCR Screening     Status: None   Collection Time: 05/03/14  4:19 PM  Result Value Ref Range Status   MRSA by PCR NEGATIVE NEGATIVE Final    Comment:        The GeneXpert MRSA Assay (FDA approved for NASAL specimens only), is one component of a comprehensive MRSA colonization surveillance program. It is not intended to diagnose MRSA infection nor to guide or monitor treatment for MRSA infections.      Studies:  Recent x-ray studies have been reviewed in detail by the Attending Physician  Time spent : [place], ANP [facility] Office  [phone] Pager [phone]   On-Call/Text Page:      [email or web address]      password [email or web address]  If 7PM-7AM, please contact night-coverage [email or web address] Password [email or web address] 05/06/2014, 2:41 PM   LOS: 3 days   I have personally examined this patient and reviewed the entire database. I have reviewed the above note, made any necessary editorial changes, and agree with its content.  Cherene Altes, MD [facility]

## 2014-05-06 NOTE — Progress Notes (Signed)
Report called pt transferring to 6E09 via bed with belongings.

## 2014-05-06 NOTE — Plan of Care (Signed)
Problem: Phase I Progression Outcomes Goal: Pain controlled with appropriate interventions Outcome: Completed/Met Date Met:  05/06/14 Goal: Initial discharge plan identified Outcome: Completed/Met Date Met:  05/06/14 Goal: Hemodynamically stable Outcome: Completed/Met Date Met:  05/06/14

## 2014-05-06 NOTE — Progress Notes (Signed)
[facility] Rounding Note Subjective:  Not having abd pain NG is out Eating jello Confused (told my PA he was "[place]" and says "I'm reserving judgment on you too")  When asked if he knows where he is says "we're in a building - they've just put an extension on it" (I don't know his baseline, though) Had HD yesterday - no heparin and no volume off. BP stable low one teens Objective Vital signs in last 24 hours: Filed Vitals:   05/05/14 1945 05/05/14 2305 05/06/14 0312 05/06/14 0700  BP: 108/58 117/80 113/56   Pulse: 76 63 86   Temp: 99.2 F (37.3 C) 98.1 F (36.7 C) 98.7 F (37.1 C) 98.3 F (36.8 C)  TempSrc: Oral Oral Oral Oral  Resp: 21 19 16    Height:      Weight:      SpO2: 98% 100% 94%    Weight change: -0.5 kg (-1 lb 1.6 oz)  Intake/Output Summary (Last 24 hours) at 05/06/14 0944 Last data filed at 05/05/14 2200  Gross per 24 hour  Intake    120 ml  Output     30 ml  Net     90 ml   Physical Exam:  BP 113/56 mmHg  Pulse 86  Temp(Src) 98.3 F (36.8 C) (Oral)  Resp 16  Ht 5\' 5"  (1.651 m)  Wt 74.4 kg (164 lb 0.4 oz)  BMI 27.29 kg/m2  SpO2 94%   Weight 75.6 Outpt EDW 74 Pleasant, but confused and declined to speak to my PA.  Allowed me to examine him, though. No JVD Lungs clear anteriorly and posteriorly Regular rhythm S1S2 No S3 Soft 1/6 murmur USB Abd + BS. Non-distended. + tenderness to palpation RLQ RLE in immobilizing padded device. No discernible edema Left AKA stump with areas of non-healing medial and lateral aspect of the incision but no purulence or drainage Left FA AVG with + bruit and thrill  Labs: Basic Metabolic Panel:  Recent Labs Lab 05/03/14 0805 05/04/14 0347 05/05/14 0318 05/06/14 0344  NA 137 135* 133* 136*  K 3.9 4.5 4.5 4.6  CL 95* 96 91* 96  CO2 25 20 18* 22  GLUCOSE 171* 72 58* 64*  BUN 28* 35* 43* 20  CREATININE 3.83* 4.68* 5.83* 3.63*  CALCIUM 9.3 9.0 9.1 8.3*  PHOS  --   --  4.9* 3.3     Recent Labs Lab 05/03/14 0805 05/04/14 0347 05/05/14 0318 05/06/14 0344  AST 76* 65*  --   --   ALT 15 14  --   --   ALKPHOS 109 105  --   --   BILITOT 0.5 0.5  --   --   PROT 7.9 7.1  --   --   ALBUMIN 2.5* 2.1* 2.2* 2.1*    Recent Labs Lab 05/03/14 0805  LIPASE 25    Recent Labs Lab 05/03/14 0805 05/04/14 0347 05/05/14 0318 05/06/14 0344  WBC 18.0* 14.1* 11.7* 8.0  NEUTROABS 15.0*  --   --   --   HGB 11.4* 10.5* 12.3* 11.5*  HCT 38.1* 34.5* 40.2 38.0*  MCV 86.2 82.9 85.0 82.6  PLT 270 240 276 279    Recent Labs Lab 05/05/14 1942 05/06/14 0019 05/06/14 0310 05/06/14 0602 05/06/14 0824  GLUCAP 94 88 72 76 67*    Studies/Results: US Abdomen Limited Ruq  05/05/2014   CLINICAL DATA:  Cholelithiasis  EXAM: US ABDOMEN LIMITED - RIGHT UPPER QUADRANT  COMPARISON:  CT  abdomen 05/03/2014  FINDINGS: Gallbladder:  Gallbladder sludge with small cholelithiasis. No pericholecystic fluid. Top normal gallbladder wall thickness. Mild gallbladder distention. Negative sonographic Murphy's sign.  Common bile duct:  Diameter: 7.8 mm  Liver:  No focal hepatic lesion. Small amount of perihepatic fluid. Heterogeneous echotexture. No intrahepatic biliary ductal dilatation.  IMPRESSION: 1. Gallbladder sludge with cholelithiasis with top normal gallbladder wall thickening and mild gallbladder distention. These findings may be reactive secondary to adjacent inflammatory changes or hepatocellular disease versus secondary to cholecystitis. 2. Heterogeneous hepatic echotexture and trace perihepatic fluid as can be seen with hepatocellular disease.   Electronically Signed   By: Kathreen Devoid   On: 05/05/2014 21:55   Medications:   . aspirin EC  81 mg Oral Daily  . atorvastatin  20 mg Oral QHS  . ciprofloxacin  400 mg Intravenous Q24H  . gabapentin  100 mg Oral QHS  . heparin subcutaneous  5,000 Units Subcutaneous 3 times per day  . Influenza vac split quadrivalent PF  0.5 mL  Intramuscular Tomorrow-1000  . insulin aspart  0-9 Units Subcutaneous TID WC  . metronidazole  500 mg Intravenous Q8H  . midodrine  10 mg Oral TID WC    Dialysis Orders: MWF @ [place] 4hrs 74 kg 2K/2Ca 400/800 Profile 4 Heparin 5000 U AVG @ LFA  No Hectorol Aranesp 160 mcg on Thur[place]olding this adm due to Hb over goal No Venofer  Assessment/Plan: 1. RLQ abdominal pain - d/t ischemic colitis. NG out. WBC has normalized. Still with some mild tenderness on exam. Remains on IV cipro and flagyl.  2. ESRD - HD on MWF @ [place] Only 46 mins of HD on 11/22. Last full outpt treatment was 11/20. Had HD 11/24 - no heparin - no volume off to avoid hypotension. Will get his next treatment on Friday. 3. Hypotension - improved. Was started on midodrine on admission (was not on as outpt that we can tell but probably should have been, just reviewing his outpt BP's at dialysis which are chronically on the low side).  Will want to definitely continue this at discharge. 4. Volume -  last reached EDW at outpt unit of 74 kg on 11/13. Probably needs higher EDW. Reassess this admission. Appears will be 74.5-75. 5. Anemia - Hgb stable and above goal. Gets 160 of Aranesp a week at outpt unit - holding for right now (last Hb over 12 but admittedly has varied some this adm) 6. Metabolic bone disease - Last phos 4.9. Last outpt iPTH 334 (parathyroidectomy in 2011); no binders at present. 7. Nutrition - Alb 2.1, NPO, vitamin. 8. Cholecystitis - in 01/2014, s/p perc chole tube, removed after cholangiogram showed patent cystic duct. Not an inssue currently 9. DM Type 2 - diet controlled, last Hgb A1C 6.5. 10. Hx CAD - evaluated for chest pain 7/4-12/2013, LBBB per EKG, negative stress test, but decreasing EF, refused cardiac cath. 11. Hx CVA - most recently in 09/2011, also in 2000. 12. Left AKA - stump still not entirely healed. Prior wound VAC. Dressing  changes. 13. Confusion - almost some paranoia today. I do not know his baseline.   Jamal Maes, MD [facility] [phone] pager 05/06/2014, 9:44 AM

## 2014-05-07 DIAGNOSIS — K8050 Calculus of bile duct without cholangitis or cholecystitis without obstruction: Secondary | ICD-10-CM

## 2014-05-07 DIAGNOSIS — K8020 Calculus of gallbladder without cholecystitis without obstruction: Secondary | ICD-10-CM | POA: Insufficient documentation

## 2014-05-07 LAB — RENAL FUNCTION PANEL
Albumin: 2.2 g/dL — ABNORMAL LOW (ref 3.5–5.2)
Anion gap: 18 — ABNORMAL HIGH (ref 5–15)
BUN: 28 mg/dL — AB (ref 6–23)
CO2: 21 meq/L (ref 19–32)
CREATININE: 4.87 mg/dL — AB (ref 0.50–1.35)
Calcium: 9 mg/dL (ref 8.4–10.5)
Chloride: 98 mEq/L (ref 96–112)
GFR calc Af Amer: 12 mL/min — ABNORMAL LOW (ref >90)
GFR calc non Af Amer: 10 mL/min — ABNORMAL LOW (ref >90)
Glucose, Bld: 66 mg/dL — ABNORMAL LOW (ref 70–99)
Phosphorus: 3.6 mg/dL (ref 2.3–4.6)
Potassium: 4.4 mEq/L (ref 3.7–5.3)
Sodium: 137 mEq/L (ref 137–147)

## 2014-05-07 LAB — CBC
HCT: 38.8 % — ABNORMAL LOW (ref 39.0–52.0)
Hemoglobin: 11.9 g/dL — ABNORMAL LOW (ref 13.0–17.0)
MCH: 25.2 pg — AB (ref 26.0–34.0)
MCHC: 30.7 g/dL (ref 30.0–36.0)
MCV: 82.2 fL (ref 78.0–100.0)
Platelets: 271 10*3/uL (ref 150–400)
RBC: 4.72 MIL/uL (ref 4.22–5.81)
RDW: 18.8 % — ABNORMAL HIGH (ref 11.5–15.5)
WBC: 7 10*3/uL (ref 4.0–10.5)

## 2014-05-07 LAB — GLUCOSE, CAPILLARY
GLUCOSE-CAPILLARY: 74 mg/dL (ref 70–99)
GLUCOSE-CAPILLARY: 86 mg/dL (ref 70–99)
GLUCOSE-CAPILLARY: 87 mg/dL (ref 70–99)
GLUCOSE-CAPILLARY: 92 mg/dL (ref 70–99)
Glucose-Capillary: 108 mg/dL — ABNORMAL HIGH (ref 70–99)
Glucose-Capillary: 76 mg/dL (ref 70–99)
Glucose-Capillary: 81 mg/dL (ref 70–99)
Glucose-Capillary: 99 mg/dL (ref 70–99)

## 2014-05-07 MED ORDER — HYDROCODONE-ACETAMINOPHEN 5-325 MG PO TABS: 1.0000 | ORAL_TABLET | Freq: Four times a day (QID) | ORAL | Status: DC | PRN

## 2014-05-07 MED ORDER — MIDODRINE HCL 10 MG PO TABS: 10.0000 mg | ORAL_TABLET | Freq: Three times a day (TID) | ORAL | Status: DC

## 2014-05-07 MED ORDER — PRO-STAT SUGAR FREE PO LIQD: 30.0000 mL | Freq: Two times a day (BID) | ORAL | Status: DC

## 2014-05-07 MED ORDER — BACITRACIN ZINC 500 UNIT/GM EX OINT
TOPICAL_OINTMENT | Freq: Two times a day (BID) | CUTANEOUS | Status: DC
  Administered 2014-05-07: 1 via TOPICAL
  Administered 2014-05-07 – 2014-05-08 (×2): via TOPICAL
  Filled 2014-05-07 (×2): qty 28.35

## 2014-05-07 MED ORDER — QUETIAPINE 12.5 MG HALF TABLET: 12.5000 mg | ORAL_TABLET | Freq: Every day | ORAL | Status: DC

## 2014-05-07 NOTE — Plan of Care (Signed)
Problem: Phase I Progression Outcomes Goal: Other Phase I Outcomes/Goals Outcome: Not Applicable Date Met:  34/37/35

## 2014-05-07 NOTE — Progress Notes (Signed)
PROGRESS NOTE  Sean [name] PPJ:[id number] DOB: 1936/02/23 DOA: 05/03/2014 PCP: No primary care provider on file.  Assessment/Plan: Colitis, ischemic / PSBO -History of episode in 2011 requiring partial colectomy -CT confirmed diagnosis of colitis -suspect hypotension during dialysis contributory -On empiric abx to prevent secondary infection - discontinue  -Patient received approximately 3 days of antibiotics, observe off of antibiotics -Appreciate general surgery assistance; conservative medical management -NGT dc'd now with slow diet advancement  Dementia with acute nocturnal delirium -became belligerent and agitated last pm with improvement by am -suspect Sundowning  -begin Seroquel 12.5 mg HS  Hypotension -started on midodrine this admission - Nephro rec continue after dc  End stage renal disease on dialysis -Nephrology following   Cholelithiasis -[place] with GB sludge and high normal GB wall thickening and mild GB distention (pt was NPO with NGT so this explains those findings) -monitor for biliary colic sx's with diet advance  -patient previously had cholecystitis in August 2015 s/p perc chole tube, removed after cholangiogram showed patent cystic duct -Presently no abdominal pain  -Monitor off antibiotics   Diabetes mellitus  -Current CBGs well-controlled -Cont SSI -Was not on medications prior to admission -Hemoglobin A1c in August was 5.9  Malnutrition of moderate degree -Consult nutritional services   Atherosclerotic PVD with ulceration/S/P AKA unilateral -PT/OT evaluations when hemodynamically stable -Wound slowly healing  -no signs of infection on exam -Continue local wound care-  Family Communication:   Pt at beside Disposition Plan:  SNF 05/08/14 if stable       Procedures/Studies: Ct Abdomen Pelvis W Contrast  05/03/2014   CLINICAL DATA:  Right lower quadrant pain for several days.  EXAM: CT ABDOMEN AND PELVIS WITH CONTRAST   TECHNIQUE: Multidetector CT imaging of the abdomen and pelvis was performed using the standard protocol following bolus administration of intravenous contrast.  CONTRAST:  184mL OMNIPAQUE IOHEXOL 300 MG/ML  SOLN  COMPARISON:  02/03/2014 and 12/09/2011  FINDINGS: Lung bases demonstrate subtle linear/heterogeneous density over the posterior bases likely atelectasis. There is moderate cardiomegaly with left main and 3 vessel atherosclerotic coronary artery disease.  Abdominal images demonstrate mild diffuse hepatic steatosis the spleen, pancreas and adrenal glands are within normal. Again noted is mild cholelithiasis. There is mild gallbladder wall thickening versus minimal pericholecystic fluid. Kidneys are somewhat small with numerous cysts compatible patient's medical renal disease/dialysis. There is a sub cm exophytic indeterminate left renal cortical nodule over the mid to lower pole unchanged from the recent prior exam. Multiple renal vascular calcifications are evident. Ureters are unremarkable. There is moderate calcified plaque of the abdominal aorta and iliac arteries.  There is evidence of patient's previous colon surgery involving the ascending colon near the cecum. There is wall thickening of this segment of colon adjacent the anastomosis. Minimal stranding of the adjacent pericolonic fat. The anastomosis is otherwise patent. There is minimal wall thickening of the small bowel loop immediately proximal to the anastomosis. There are several mildly prominent/borderline dilated small bowel loops which may be due to ileus versus partial obstructive effect at the anastomosis due to the inflamed colon.  Posterior changes of the midline anterior abdominal wall. Small incisional hernia near the umbilicus containing only peritoneal fat.  Pelvic images demonstrate a decompressed bladder. The prostate and rectum are within normal. Bony structures demonstrate continued evidence of renal osteodystrophy. Degenerative  changes of the hips and spine. Multiple Schmorl's nodes throughout the spine.  IMPRESSION: Wall thickening with mild adjacent  inflammatory change involving the colon adjacent the anastomosis in the right lower quadrant compatible with acute colitis. Associated colon to small bowel anastomosis appears to be patent with minimal wall thickening of the small bowel immediately proximal to the anastomosis along with several borderline dilated more proximal small bowel loops likely secondary to ileus versus partial obstructing effect at the anastomosis due to the inflamed colon.  Cholelithiasis with evidence of mild gallbladder wall thickening versus adjacent pericholecystic fluid. Right upper quadrant ultrasound may be helpful for further evaluation.  Somewhat small kidneys with numerous cysts unchanged. Indeterminate subcentimeter exophytic left renal nodule unchanged from the recent prior exam. Recommend CT in 6 months.  This recommendation follows ACR consensus guidelines: Managing Incidental Findings on Abdominal CT: White Paper of the ACR Incidental Findings Committee. J Am Coll Radiol 2010;7:754-773  Small incisional hernia adjacent the umbilicus containing only peritoneal fat.  Cardiomegaly with left main and 3 vessel atherosclerotic coronary artery disease. Mild bibasilar atelectasis.  Mild hepatic steatosis.   Electronically Signed   By: Marin Olp M.D.   On: 05/03/2014 11:04   Dg Chest Port 1 View  05/03/2014   CLINICAL DATA:  Encounter for NG tube placement.  EXAM: PORTABLE CHEST - 1 VIEW  COMPARISON:  Radiograph 03/31/2014  FINDINGS: Interval placement of NG tube the tip of which extends beyond the lower margin of the film in the course of the stomach. Stable enlarged cardiac silhouette. There is central venous pulmonary congestion. Bibasilar atelectasis.  IMPRESSION: NG tube extends into the stomach and beyond the lower margin of film.   Electronically Signed   By: Suzy Bouchard M.D.   On:  05/03/2014 18:37   US Abdomen Limited Ruq  05/05/2014   CLINICAL DATA:  Cholelithiasis  EXAM: US ABDOMEN LIMITED - RIGHT UPPER QUADRANT  COMPARISON:  CT abdomen 05/03/2014  FINDINGS: Gallbladder:  Gallbladder sludge with small cholelithiasis. No pericholecystic fluid. Top normal gallbladder wall thickness. Mild gallbladder distention. Negative sonographic Murphy's sign.  Common bile duct:  Diameter: 7.8 mm  Liver:  No focal hepatic lesion. Small amount of perihepatic fluid. Heterogeneous echotexture. No intrahepatic biliary ductal dilatation.  IMPRESSION: 1. Gallbladder sludge with cholelithiasis with top normal gallbladder wall thickening and mild gallbladder distention. These findings may be reactive secondary to adjacent inflammatory changes or hepatocellular disease versus secondary to cholecystitis. 2. Heterogeneous hepatic echotexture and trace perihepatic fluid as can be seen with hepatocellular disease.   Electronically Signed   By: Kathreen Devoid   On: 05/05/2014 21:55         Subjective: Patient is presently confused. He knows today is Thanksgiving but does not know he is in the hospital. Denies any shortness of breath, chest pain, nausea, vomiting, diarrhea, abdominal pain.   Objective: Filed Vitals:   05/06/14 1736 05/06/14 2013 05/07/14 0411 05/07/14 0745  BP: 128/83 123/64 121/74 133/74  Pulse: 88 82 82 83  Temp: 98.8 F (37.1 C) 99 F (37.2 C) 98.6 F (37 C) 98.5 F (36.9 C)  TempSrc: Oral Oral Oral Oral  Resp: 22 16 17 18   Height:      Weight:  73.6 kg (162 lb 4.1 oz)    SpO2: 95% 100% 97% 97%    Intake/Output Summary (Last 24 hours) at 05/07/14 1056 Last data filed at 05/06/14 1117  Gross per 24 hour  Intake    240 ml  Output      0 ml  Net    240 ml   Weight change: -1.5 kg (-  3 lb 4.9 oz) Exam:   General:  Pt is alert, follows commands appropriately, not in acute distress  HEENT: No icterus, No thrush, [place]/AT  Cardiovascular: RRR, S1/S2, no rubs, no  gallops  Respiratory: bibasilar crackles. No wheezing. Good air movement   Abdomen: Soft/+BS, non tender, non distended, no guarding  Extremities: L AKA--no erythema or drainage ; right foot in padded boot   Data Reviewed: Basic Metabolic Panel:  Recent Labs Lab 05/03/14 0805 05/04/14 0347 05/05/14 0318 05/06/14 0344 05/07/14 0430  NA 137 135* 133* 136* 137  K 3.9 4.5 4.5 4.6 4.4  CL 95* 96 91* 96 98  CO2 25 20 18* 22 21  GLUCOSE 171* 72 58* 64* 66*  BUN 28* 35* 43* 20 28*  CREATININE 3.83* 4.68* 5.83* 3.63* 4.87*  CALCIUM 9.3 9.0 9.1 8.3* 9.0  PHOS  --   --  4.9* 3.3 3.6   Liver Function Tests:  Recent Labs Lab 05/03/14 0805 05/04/14 0347 05/05/14 0318 05/06/14 0344 05/07/14 0430  AST 76* 65*  --   --   --   ALT 15 14  --   --   --   ALKPHOS 109 105  --   --   --   BILITOT 0.5 0.5  --   --   --   PROT 7.9 7.1  --   --   --   ALBUMIN 2.5* 2.1* 2.2* 2.1* 2.2*    Recent Labs Lab 05/03/14 0805  LIPASE 25   No results for input(s): AMMONIA in the last 168 hours. CBC:  Recent Labs Lab 05/03/14 0805 05/04/14 0347 05/05/14 0318 05/06/14 0344 05/07/14 0430  WBC 18.0* 14.1* 11.7* 8.0 7.0  NEUTROABS 15.0*  --   --   --   --   HGB 11.4* 10.5* 12.3* 11.5* 11.9*  HCT 38.1* 34.5* 40.2 38.0* 38.8*  MCV 86.2 82.9 85.0 82.6 82.2  PLT 270 240 276 279 271   Cardiac Enzymes: No results for input(s): CKTOTAL, CKMB, CKMBINDEX, TROPONINI in the last 168 hours. BNP: Invalid input(s): POCBNP CBG:  Recent Labs Lab 05/06/14 2020 05/07/14 0018 05/07/14 0407 05/07/14 0702 05/07/14 0736  GLUCAP 103* 81 74 86 76    Recent Results (from the past 240 hour(s))  MRSA PCR Screening     Status: None   Collection Time: 05/03/14  4:19 PM  Result Value Ref Range Status   MRSA by PCR NEGATIVE NEGATIVE Final    Comment:        The GeneXpert MRSA Assay (FDA approved for NASAL specimens only), is one component of a comprehensive MRSA colonization surveillance program.  It is not intended to diagnose MRSA infection nor to guide or monitor treatment for MRSA infections.      Scheduled Meds: . aspirin EC  81 mg Oral Daily  . atorvastatin  20 mg Oral QHS  . bacitracin   Topical BID  . feeding supplement (PRO-STAT SUGAR FREE 64)  30 mL Oral BID BM  . gabapentin  100 mg Oral QHS  . heparin subcutaneous  5,000 Units Subcutaneous 3 times per day  . Influenza vac split quadrivalent PF  0.5 mL Intramuscular Tomorrow-1000  . insulin aspart  0-9 Units Subcutaneous TID WC  . midodrine  10 mg Oral TID WC  . QUEtiapine  12.5 mg Oral QHS   Continuous Infusions:    Santhiago Collingsworth, DO  [facility] Pager [phone]  If 7PM-7AM, please contact night-coverage [email or web address] Password [facility] 05/07/2014, 10:56 AM  LOS: 4 days

## 2014-05-07 NOTE — Consult Note (Signed)
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  This is also the site of a previously healed partial thickness tissue injury that has resurfaced, but has not re- pigmented fully.  Also assessed is the left stump wound, which is closely approximated and the right knee and heel, which both have stable eschar. Wound type: Pressure, surgical, moisture Pressure Ulcer POA: Yes Measurement: Right heel:  2cm x 1.5cm with depth obscured by the presence of stable eschar.  RIght knee:  Two areas of stable eschar in a 4cm x 3cm area.  Left approximated incision measures 0.2cm x 10cm with two satellite open areas (healinjg full thickness).  2cm x 1cm x 0.2cm partial thickness skin loss secondary to IAD at coccyx. Wound bed:AS described above. Drainage (amount, consistency, odor) none Periwound:intact, dry Dressing procedure/placement/frequency: I will add a protective soft silicone foam dressing to the right heel and knee, bacitracin ointment to the healing incision and a skin care protocol for the moisture associated skin damage/IAD.  Patient is being turned side to side, is on a pressure redistribution mattress, has a pressure redistribution heel boot in place. [facility] nursing team will not follow, but will remain available to this patient, the nursing and medical team.  Please re-consult if needed. Thanks, Maudie Flakes, MSN, RN, [place], [place], [place] [phone])

## 2014-05-07 NOTE — Progress Notes (Signed)
[facility] Progress Note     Subjective: Pt more alert today.  No abdominal pain, no N/V.  Tolerated full liquids this am.  Has mitts on in bed.  Doesn't seem agitated.  Answering questions appropriately, but confused about which hospital he's in.  He knows he's in [place] and that today is November 25-26th "thanksgiving".    Objective: Vital signs in last 24 hours: Temp:  [98.3 F (36.8 C)-99 F (37.2 C)] 98.5 F (36.9 C) (11/26 0745) Pulse Rate:  [73-88] 83 (11/26 0745) Resp:  [16-22] 18 (11/26 0745) BP: (121-133)/(64-83) 133/74 mmHg (11/26 0745) SpO2:  [95 %-100 %] 97 % (11/26 0745) Weight:  [162 lb 4.1 oz (73.6 kg)] 162 lb 4.1 oz (73.6 kg) (11/25 2013) Last BM Date: 05/06/14  Intake/Output from previous day: 11/25 0701 - 11/26 0700 In: 240 [P.O.:240] Out: -  Intake/Output this shift:    PE: Gen:  Alert, NAD, pleasant Card:  RRR, no M/G/R heard Pulm:  CTA, no W/R/R Abd: Soft, ND, minimal tenderness to deep palpation in the RLQ, +BS, no HSM, abdominal scars noted   Lab Results:   Recent Labs  05/06/14 0344 05/07/14 0430  WBC 8.0 7.0  HGB 11.5* 11.9*  HCT 38.0* 38.8*  PLT 279 271   BMET  Recent Labs  05/06/14 0344 05/07/14 0430  NA 136* 137  K 4.6 4.4  CL 96 98  CO2 22 21  GLUCOSE 64* 66*  BUN 20 28*  CREATININE 3.63* 4.87*  CALCIUM 8.3* 9.0   PT/INR No results for input(s): LABPROT, INR in the last 72 hours. CMP     Component Value Date/Time   NA 137 05/07/2014 0430   K 4.4 05/07/2014 0430   CL 98 05/07/2014 0430   CO2 21 05/07/2014 0430   GLUCOSE 66* 05/07/2014 0430   BUN 28* 05/07/2014 0430   CREATININE 4.87* 05/07/2014 0430   CALCIUM 9.0 05/07/2014 0430   PROT 7.1 05/04/2014 0347   ALBUMIN 2.2* 05/07/2014 0430   AST 65* 05/04/2014 0347   ALT 14 05/04/2014 0347   ALKPHOS 105 05/04/2014 0347   BILITOT 0.5 05/04/2014 0347   GFRNONAA 10* 05/07/2014 0430   GFRAA 12* 05/07/2014 0430   Lipase     Component Value  Date/Time   LIPASE 25 05/03/2014 0805       Studies/Results: US Abdomen Limited Ruq  05/05/2014   CLINICAL DATA:  Cholelithiasis  EXAM: US ABDOMEN LIMITED - RIGHT UPPER QUADRANT  COMPARISON:  CT abdomen 05/03/2014  FINDINGS: Gallbladder:  Gallbladder sludge with small cholelithiasis. No pericholecystic fluid. Top normal gallbladder wall thickness. Mild gallbladder distention. Negative sonographic Murphy's sign.  Common bile duct:  Diameter: 7.8 mm  Liver:  No focal hepatic lesion. Small amount of perihepatic fluid. Heterogeneous echotexture. No intrahepatic biliary ductal dilatation.  IMPRESSION: 1. Gallbladder sludge with cholelithiasis with top normal gallbladder wall thickening and mild gallbladder distention. These findings may be reactive secondary to adjacent inflammatory changes or hepatocellular disease versus secondary to cholecystitis. 2. Heterogeneous hepatic echotexture and trace perihepatic fluid as can be seen with hepatocellular disease.   Electronically Signed   By: Kathreen Devoid   On: 05/05/2014 21:55    Anti-infectives: Anti-infectives    Start     Dose/Rate Route Frequency Ordered Stop   05/04/14 1500  ciprofloxacin (CIPRO) IVPB 400 mg     400 mg200 mL/hr over 60 Minutes Intravenous Every 24 hours 05/03/14 1341 05/06/14 2359   05/03/14 2000  metroNIDAZOLE (FLAGYL) IVPB 500  mg     500 mg100 mL/hr over 60 Minutes Intravenous Every 8 hours 05/03/14 1511 05/06/14 2359   05/03/14 1600  metroNIDAZOLE (FLAGYL) IVPB 500 mg  Status:  Discontinued     500 mg100 mL/hr over 60 Minutes Intravenous Every 8 hours 05/03/14 1554 05/03/14 1647   05/03/14 1400  ciprofloxacin (CIPRO) IVPB 400 mg     400 mg200 mL/hr over 60 Minutes Intravenous  Once 05/03/14 1340 05/03/14 1538   05/03/14 1130  metroNIDAZOLE (FLAGYL) IVPB 500 mg     500 mg100 mL/hr over 60 Minutes Intravenous  Once 05/03/14 1126 05/03/14 1343       Assessment/Plan Ischemic colitis  Plan: 1.  NG tube discontinued  yesterday, tolerating fulls, advance as tolerated 2.  Now on floor 3.  Ambulate and IS as able 4.  SCD's and lovenox 5.  Home when tolerating soft diet, when medically stable     LOS: 4 days    Coralie Keens 05/07/2014, 9:19 AM Pager: [phone]  Patient seen and examined also.  Agree with above Kaylyn Lim, MD

## 2014-05-07 NOTE — Discharge Summary (Signed)
Physician Discharge Summary  NAJIB COLMENARES DGL:[id number] DOB: December 04, 1935 DOA: 05/03/2014  PCP: No primary care provider on file.  Admit date: 05/03/2014 Discharge date: 05/08/14 Recommendations for Outpatient Follow-up:  1. Pt will need to follow up with PCP in 2 weeks post discharge 2. Please obtain BMP and CBC in one week   Discharge Diagnoses:  Colitis, ischemic / PSBO -History of episode in 2011 requiring partial colectomy -CT confirmed diagnosis of colitis -suspect hypotension during dialysis contributory -On empiric abx to prevent secondary infection - discontinue  -Patient received approximately 3 days of antibiotics, observe off of antibiotics -The patient remained clinically stable and afebrile off antibiotics- -Appreciate general surgery assistance; conservative medical management -NGT dc'd now with slow diet advancement -His diet was advanced to a soft diet which the patient tolerated  Dementia with acute nocturnal delirium -became belligerent and agitated last pm with improvement by am -suspect Sundowning  -begin Seroquel 12.5 mg HS  Hypotension -started on midodrine this admission - Nephro rec continue after dc -Patient will continue on Midodrine 10 mg 3 times a day after discharge  End stage renal disease on dialysis -Nephrology following   Cholelithiasis -[place] with GB sludge and high normal GB wall thickening and mild GB distention (pt was NPO with NGT so this explains those findings) -monitor for biliary colic sx's with diet advance  -patient previously had cholecystitis in August 2015 s/p perc chole tube, removed (03/27/14) after cholangiogram showed patent cystic duct -Presently no abdominal pain  -Monitor off antibiotics   Diabetes mellitus  -Current CBGs well-controlled -Cont SSI -Was not on medications prior to admission -Hemoglobin A1c in August was 5.9  Malnutrition of moderate degree -Consult nutritional services   Atherosclerotic  PVD with ulceration/S/P AKA unilateral -PT/OT evaluations when hemodynamically stable -Wound slowly healing -no signs of infection on exam -Continue local wound care--patient was seen by wound care nurse and recommendations were provided --To the right heel and knee--add a protective soft silicone foam dressing to the right heel and knee; L-stump--bacitracin ointment to the healing incision and a skin care protocol for the moisture associated skin damage; pressure redistribution heel boot    Discharge Condition: Stable  Disposition:  skilled nursing facility  Diet: Soft Wt Readings from Last 3 Encounters:  05/08/14 75.4 kg (166 lb 3.6 oz)  04/28/14 74.753 kg (164 lb 12.8 oz)  04/23/14 74.39 kg (164 lb)    History of present illness:  78 yo male w/ hx of chronic kidney disease on dialysis, peripheral vascular disease status post left AKA, diabetes, dementia, history of prior ischemic colitis requiring colon resection 2011. He resides at a skilled nursing facility. On the date of admission he was undergoing dialysis when he developed abdominal pain and was sent to the emergency department.  In the ER he was found to be hypotensive with a systolic blood pressure in the 70s. Critical care medicine was called and advised to rehydrate the patient and admit to stepdown. At time of admission patient reported right-sided abdominal pain. No apparent nausea vomiting, diarrhea fevers or chills. White count was 18,000 at admission, lactic acid was elevated at 2.54. Hemoglobin 11.4. Gen Surgery was consulted.  Patient symptoms have improved with conservative medical management. His NG tube has been discontinued and diet is being advanced. Patient had hypotension during dialysis and after admission. Nephrology suspects this will continue as a chronic problem and recommends continuing midodrine after discharge. After the patient's NG was removed, the patient's diet was gradually advanced to a  soft diet. He  tolerated the diet without any worsening abdominal pain or vomiting. The patient received 3 days of antibiotics. This was ultimately discontinued. The patient was observed off antibiotics and remained afebrile without any worsening leukocytosis. He will be discharged without any antibiotics. The patient was followed by general surgery. They recommended conservative therapy. The ultimately cleared patient for discharge.    Consultants: Nephrology--[facility] CCM  Discharge Exam: Filed Vitals:   05/08/14 0829  BP: 105/61  Pulse: 77  Temp:   Resp:    Filed Vitals:   05/08/14 0715 05/08/14 0730 05/08/14 0800 05/08/14 0829  BP: 103/60 106/58 101/66 105/61  Pulse: 81 78 78 77  Temp: 98 F (36.7 C)     TempSrc: Oral     Resp: 18     Height:      Weight: 75.4 kg (166 lb 3.6 oz)     SpO2: 98%      General: Awake and alert, NAD, pleasant, cooperative Cardiovascular: RRR, no rub, no gallop, no S3 Respiratory: Bibasilar crackles. Good air movement. No wheezing Abdomen:soft, nontender, nondistended, positive bowel sounds Extremities: L AKA--no erythema or drainage ; right foot in padded boot  Discharge Instructions      Discharge Instructions    Diet - low sodium heart healthy    Complete by:  As directed      Increase activity slowly    Complete by:  As directed             Medication List    TAKE these medications        acetaminophen 325 MG tablet  Commonly known as:  TYLENOL  Take 2 tablets (650 mg total) by mouth every 6 (six) hours as needed for mild pain, fever or headache.     albuterol (2.5 MG/3ML) 0.083% nebulizer solution  Commonly known as:  PROVENTIL  Take 3 mLs (2.5 mg total) by nebulization every 6 (six) hours as needed for wheezing or shortness of breath.     aspirin EC 81 MG tablet  Take 81 mg by mouth daily.     atorvastatin 20 MG tablet  Commonly known as:  LIPITOR  Take 20 mg by mouth at bedtime.     b complex-vitamin c-folic acid  0.8 MG Tabs tablet  Take 0.8 mg by mouth at bedtime.     feeding supplement (PRO-STAT SUGAR FREE 64) Liqd  Take 30 mLs by mouth 2 (two) times daily between meals.     feeding supplement (RESOURCE BREEZE) Liqd  Take 1 Container by mouth 2 (two) times daily between meals.     gabapentin 100 MG capsule  Commonly known as:  NEURONTIN  Take 100 mg by mouth at bedtime.     HYDROcodone-acetaminophen 5-325 MG per tablet  Commonly known as:  NORCO/VICODIN  Take 1 tablet by mouth every 6 (six) hours as needed for moderate pain.     midodrine 10 MG tablet  Commonly known as:  PROAMATINE  Take 1 tablet (10 mg total) by mouth 3 (three) times daily with meals.     QUEtiapine 12.5 mg Tabs tablet  Commonly known as:  SEROQUEL  Take 0.5 tablets (12.5 mg total) by mouth at bedtime.         The results of significant diagnostics from this hospitalization (including imaging, microbiology, ancillary and laboratory) are listed below for reference.    Significant Diagnostic Studies: Ct Abdomen Pelvis W Contrast  05/03/2014   CLINICAL DATA:  Right lower quadrant  pain for several days.  EXAM: CT ABDOMEN AND PELVIS WITH CONTRAST  TECHNIQUE: Multidetector CT imaging of the abdomen and pelvis was performed using the standard protocol following bolus administration of intravenous contrast.  CONTRAST:  169mL OMNIPAQUE IOHEXOL 300 MG/ML  SOLN  COMPARISON:  02/03/2014 and 12/09/2011  FINDINGS: Lung bases demonstrate subtle linear/heterogeneous density over the posterior bases likely atelectasis. There is moderate cardiomegaly with left main and 3 vessel atherosclerotic coronary artery disease.  Abdominal images demonstrate mild diffuse hepatic steatosis the spleen, pancreas and adrenal glands are within normal. Again noted is mild cholelithiasis. There is mild gallbladder wall thickening versus minimal pericholecystic fluid. Kidneys are somewhat small with numerous cysts compatible patient's medical renal  disease/dialysis. There is a sub cm exophytic indeterminate left renal cortical nodule over the mid to lower pole unchanged from the recent prior exam. Multiple renal vascular calcifications are evident. Ureters are unremarkable. There is moderate calcified plaque of the abdominal aorta and iliac arteries.  There is evidence of patient's previous colon surgery involving the ascending colon near the cecum. There is wall thickening of this segment of colon adjacent the anastomosis. Minimal stranding of the adjacent pericolonic fat. The anastomosis is otherwise patent. There is minimal wall thickening of the small bowel loop immediately proximal to the anastomosis. There are several mildly prominent/borderline dilated small bowel loops which may be due to ileus versus partial obstructive effect at the anastomosis due to the inflamed colon.  Posterior changes of the midline anterior abdominal wall. Small incisional hernia near the umbilicus containing only peritoneal fat.  Pelvic images demonstrate a decompressed bladder. The prostate and rectum are within normal. Bony structures demonstrate continued evidence of renal osteodystrophy. Degenerative changes of the hips and spine. Multiple Schmorl's nodes throughout the spine.  IMPRESSION: Wall thickening with mild adjacent inflammatory change involving the colon adjacent the anastomosis in the right lower quadrant compatible with acute colitis. Associated colon to small bowel anastomosis appears to be patent with minimal wall thickening of the small bowel immediately proximal to the anastomosis along with several borderline dilated more proximal small bowel loops likely secondary to ileus versus partial obstructing effect at the anastomosis due to the inflamed colon.  Cholelithiasis with evidence of mild gallbladder wall thickening versus adjacent pericholecystic fluid. Right upper quadrant ultrasound may be helpful for further evaluation.  Somewhat small kidneys with  numerous cysts unchanged. Indeterminate subcentimeter exophytic left renal nodule unchanged from the recent prior exam. Recommend CT in 6 months.  This recommendation follows ACR consensus guidelines: Managing Incidental Findings on Abdominal CT: White Paper of the ACR Incidental Findings Committee. J Am Coll Radiol 2010;7:754-773  Small incisional hernia adjacent the umbilicus containing only peritoneal fat.  Cardiomegaly with left main and 3 vessel atherosclerotic coronary artery disease. Mild bibasilar atelectasis.  Mild hepatic steatosis.   Electronically Signed   By: Marin Olp M.D.   On: 05/03/2014 11:04   Dg Chest Port 1 View  05/03/2014   CLINICAL DATA:  Encounter for NG tube placement.  EXAM: PORTABLE CHEST - 1 VIEW  COMPARISON:  Radiograph 03/31/2014  FINDINGS: Interval placement of NG tube the tip of which extends beyond the lower margin of the film in the course of the stomach. Stable enlarged cardiac silhouette. There is central venous pulmonary congestion. Bibasilar atelectasis.  IMPRESSION: NG tube extends into the stomach and beyond the lower margin of film.   Electronically Signed   By: Suzy Bouchard M.D.   On: 05/03/2014 18:37   US Abdomen  Limited Ruq  05/05/2014   CLINICAL DATA:  Cholelithiasis  EXAM: US ABDOMEN LIMITED - RIGHT UPPER QUADRANT  COMPARISON:  CT abdomen 05/03/2014  FINDINGS: Gallbladder:  Gallbladder sludge with small cholelithiasis. No pericholecystic fluid. Top normal gallbladder wall thickness. Mild gallbladder distention. Negative sonographic Murphy's sign.  Common bile duct:  Diameter: 7.8 mm  Liver:  No focal hepatic lesion. Small amount of perihepatic fluid. Heterogeneous echotexture. No intrahepatic biliary ductal dilatation.  IMPRESSION: 1. Gallbladder sludge with cholelithiasis with top normal gallbladder wall thickening and mild gallbladder distention. These findings may be reactive secondary to adjacent inflammatory changes or hepatocellular disease versus  secondary to cholecystitis. 2. Heterogeneous hepatic echotexture and trace perihepatic fluid as can be seen with hepatocellular disease.   Electronically Signed   By: Kathreen Devoid   On: 05/05/2014 21:55     Microbiology: Recent Results (from the past 240 hour(s))  MRSA PCR Screening     Status: None   Collection Time: 05/03/14  4:19 PM  Result Value Ref Range Status   MRSA by PCR NEGATIVE NEGATIVE Final    Comment:        The GeneXpert MRSA Assay (FDA approved for NASAL specimens only), is one component of a comprehensive MRSA colonization surveillance program. It is not intended to diagnose MRSA infection nor to guide or monitor treatment for MRSA infections.      Labs: Basic Metabolic Panel:  Recent Labs Lab 05/04/14 0347 05/05/14 0318 05/06/14 0344 05/07/14 0430 05/08/14 0733  NA 135* 133* 136* 137 139  K 4.5 4.5 4.6 4.4 4.0  CL 96 91* 96 98 98  CO2 20 18* 22 21 25   GLUCOSE 72 58* 64* 66* 86  BUN 35* 43* 20 28* 39*  CREATININE 4.68* 5.83* 3.63* 4.87* 5.94*  CALCIUM 9.0 9.1 8.3* 9.0 8.4  PHOS  --  4.9* 3.3 3.6 4.2   Liver Function Tests:  Recent Labs Lab 05/03/14 0805 05/04/14 0347 05/05/14 0318 05/06/14 0344 05/07/14 0430 05/08/14 0733  AST 76* 65*  --   --   --   --   ALT 15 14  --   --   --   --   ALKPHOS 109 105  --   --   --   --   BILITOT 0.5 0.5  --   --   --   --   PROT 7.9 7.1  --   --   --   --   ALBUMIN 2.5* 2.1* 2.2* 2.1* 2.2* 2.1*    Recent Labs Lab 05/03/14 0805  LIPASE 25   No results for input(s): AMMONIA in the last 168 hours. CBC:  Recent Labs Lab 05/03/14 0805 05/04/14 0347 05/05/14 0318 05/06/14 0344 05/07/14 0430 05/08/14 0755  WBC 18.0* 14.1* 11.7* 8.0 7.0 6.0  NEUTROABS 15.0*  --   --   --   --   --   HGB 11.4* 10.5* 12.3* 11.5* 11.9* 11.9*  HCT 38.1* 34.5* 40.2 38.0* 38.8* 38.2*  MCV 86.2 82.9 85.0 82.6 82.2 81.1  PLT 270 240 276 279 271 303   Cardiac Enzymes: No results for input(s): CKTOTAL, CKMB,  CKMBINDEX, TROPONINI in the last 168 hours. BNP: Invalid input(s): POCBNP CBG:  Recent Labs Lab 05/07/14 1141 05/07/14 1706 05/07/14 2117 05/07/14 2358 05/08/14 0417  GLUCAP 99 87 108* 92 77    Time coordinating discharge:  Greater than 30 minutes  Signed:  Delois Silvester, DO [facility] Pager: [phone] 05/08/2014, 8:50 AM

## 2014-05-07 NOTE — Progress Notes (Signed)
[facility] Rounding Note Subjective:    Doesn't know where he is or why he is in the hospital. No complaints  Hands mittened Poured his coffee out on his bed Confused, but not as paranoid as he was yesterday  Objective Filed Vitals:   05/06/14 1736 05/06/14 2013 05/07/14 0411 05/07/14 0745  BP: 128/83 123/64 121/74 133/74  Pulse: 88 82 82 83  Temp: 98.8 F (37.1 C) 99 F (37.2 C) 98.6 F (37 C) 98.5 F (36.9 C)  TempSrc: Oral Oral Oral Oral  Resp: 22 16 17 18   Height:      Weight:  73.6 kg (162 lb 4.1 oz)    SpO2: 95% 100% 97% 97%   Physical Exam General: confused. No acute distress. bilat hand mitts  Heart: RRR Lungs: CTA, unlabored  Abdomen: soft, nontender +BS still some mild tenderness to deep palpation in the right lower abd Extremities: R foot in padded boot. L AKA no edema Dialysis Access: L AVG +b/t  Dialysis Orders: MWF @ [place] 4hrs 74 kg 2K/2Ca 400/800 Profile 4 Heparin 5000 UAVG @ LFA  No Hectorol Aranesp 160 mcg on Thursholding this adm due to Hb over goal No Venofer   Assessment/Plan: 1. Ischemic colitis. NG out. WBC has normalized. Minimal tenderness on exam. cipro and flagyl- completed 2. ESRD - HD on MWF @ [place]- no heparin - no volume off last HD to avoid hypotension. Will get his next treatment on Friday. 3. Hypotension - improved- now 133/74 Was started on midodrine on admission  Will want to definitely continue this at discharge. 4. Volume - last reached EDW at outpt unit of 74 kg on 11/13- had been leaving above. Slightly under edw now- reassess at DC 5. Anemia - Hgb 11.9 stable and above goal. Gets 160 of Aranesp a week at outpt unit - holding for right now (last Hb over 12 but admittedly has varied some this adm)- watch CBC 6. Metabolic bone disease -phos 3.6. Last outpt iPTH 334 (parathyroidectomy in 2011); no binders at present. 7. Nutrition - Alb 2.2, full liquid diet vitamin. 8. Cholecystitis - in  01/2014, s/p perc chole tube, removed after cholangiogram showed patent cystic duct. Not an inssue currently 9. DM Type 2 - diet controlled, last Hgb A1C 6.5. 10. Hx CAD - evaluated for chest pain 7/4-12/2013, LBBB per EKG, negative stress test, but decreasing EF, refused cardiac cath. 11. Hx CVA - most recently in 09/2011, also in 2000. 12. Left AKA - stump still not entirely healed. Prior wound VAC. Dressing changes. 13. Confusion -oriented to self only- pleasant   Shelle Iron, NP [facility] [phone] 05/07/2014,9:35 AM  LOS: 4 days   I have seen and examined this patient and agree with plan and assessment in the above note of BWhelan with highlighted additions.  Abdomen continues to improve, BP better since addition of midodrine this admission. Remains confused (I am unclear what his most recent baseline is).  Next dialysis tomorrow.  Yarithza Mink B,MD 05/07/2014 10:41 AM   Additional Objective Labs: Basic Metabolic Panel:  Recent Labs Lab 05/05/14 0318 05/06/14 0344 05/07/14 0430  NA 133* 136* 137  K 4.5 4.6 4.4  CL 91* 96 98  CO2 18* 22 21  GLUCOSE 58* 64* 66*  BUN 43* 20 28*  CREATININE 5.83* 3.63* 4.87*  CALCIUM 9.1 8.3* 9.0  PHOS 4.9* 3.3 3.6   Liver Function Tests:  Recent Labs Lab 05/03/14 0805 05/04/14 0347 05/05/14 0318 05/06/14 0344 05/07/14 0430  AST 76* 65*  --   --   --   ALT 15 14  --   --   --   ALKPHOS 109 105  --   --   --   BILITOT 0.5 0.5  --   --   --   PROT 7.9 7.1  --   --   --   ALBUMIN 2.5* 2.1* 2.2* 2.1* 2.2*    Recent Labs Lab 05/03/14 0805  LIPASE 25   CBC:  Recent Labs Lab 05/03/14 0805 05/04/14 0347 05/05/14 0318 05/06/14 0344 05/07/14 0430  WBC 18.0* 14.1* 11.7* 8.0 7.0  NEUTROABS 15.0*  --   --   --   --   HGB 11.4* 10.5* 12.3* 11.5* 11.9*  HCT 38.1* 34.5* 40.2 38.0* 38.8*  MCV 86.2 82.9 85.0 82.6 82.2  PLT 270 240 276 279 271    Recent Labs Lab 05/06/14 2020 05/07/14 0018  05/07/14 0407 05/07/14 0702 05/07/14 0736  GLUCAP 103* 81 74 86 76   Studies/Results: US Abdomen Limited Ruq  05/05/2014   CLINICAL DATA:  Cholelithiasis  EXAM: US ABDOMEN LIMITED - RIGHT UPPER QUADRANT  COMPARISON:  CT abdomen 05/03/2014  FINDINGS: Gallbladder:  Gallbladder sludge with small cholelithiasis. No pericholecystic fluid. Top normal gallbladder wall thickness. Mild gallbladder distention. Negative sonographic Murphy's sign.  Common bile duct:  Diameter: 7.8 mm  Liver:  No focal hepatic lesion. Small amount of perihepatic fluid. Heterogeneous echotexture. No intrahepatic biliary ductal dilatation.  IMPRESSION: 1. Gallbladder sludge with cholelithiasis with top normal gallbladder wall thickening and mild gallbladder distention. These findings may be reactive secondary to adjacent inflammatory changes or hepatocellular disease versus secondary to cholecystitis. 2. Heterogeneous hepatic echotexture and trace perihepatic fluid as can be seen with hepatocellular disease.   Electronically Signed   By: Kathreen Devoid   On: 05/05/2014 21:55   Medications:   . aspirin EC  81 mg Oral Daily  . atorvastatin  20 mg Oral QHS  . bacitracin   Topical BID  . feeding supplement (PRO-STAT SUGAR FREE 64)  30 mL Oral BID BM  . gabapentin  100 mg Oral QHS  . heparin subcutaneous  5,000 Units Subcutaneous 3 times per day  . Influenza vac split quadrivalent PF  0.5 mL Intramuscular Tomorrow-1000  . insulin aspart  0-9 Units Subcutaneous TID WC  . midodrine  10 mg Oral TID WC  . QUEtiapine  12.5 mg Oral QHS

## 2014-05-08 DIAGNOSIS — K559 Vascular disorder of intestine, unspecified: Secondary | ICD-10-CM

## 2014-05-08 LAB — CBC
HCT: 38.2 % — ABNORMAL LOW (ref 39.0–52.0)
Hemoglobin: 11.9 g/dL — ABNORMAL LOW (ref 13.0–17.0)
MCH: 25.3 pg — ABNORMAL LOW (ref 26.0–34.0)
MCHC: 31.2 g/dL (ref 30.0–36.0)
MCV: 81.1 fL (ref 78.0–100.0)
Platelets: 303 10*3/uL (ref 150–400)
RBC: 4.71 MIL/uL (ref 4.22–5.81)
RDW: 18.6 % — ABNORMAL HIGH (ref 11.5–15.5)
WBC: 6 10*3/uL (ref 4.0–10.5)

## 2014-05-08 LAB — GLUCOSE, CAPILLARY
GLUCOSE-CAPILLARY: 142 mg/dL — AB (ref 70–99)
Glucose-Capillary: 77 mg/dL (ref 70–99)
Glucose-Capillary: 71 mg/dL (ref 70–99)

## 2014-05-08 LAB — RENAL FUNCTION PANEL
ALBUMIN: 2.1 g/dL — AB (ref 3.5–5.2)
Anion gap: 16 — ABNORMAL HIGH (ref 5–15)
BUN: 39 mg/dL — ABNORMAL HIGH (ref 6–23)
CALCIUM: 8.4 mg/dL (ref 8.4–10.5)
CO2: 25 meq/L (ref 19–32)
CREATININE: 5.94 mg/dL — AB (ref 0.50–1.35)
Chloride: 98 mEq/L (ref 96–112)
GFR calc Af Amer: 10 mL/min — ABNORMAL LOW (ref >90)
GFR, EST NON AFRICAN AMERICAN: 8 mL/min — AB (ref >90)
Glucose, Bld: 86 mg/dL (ref 70–99)
Phosphorus: 4.2 mg/dL (ref 2.3–4.6)
Potassium: 4 mEq/L (ref 3.7–5.3)
Sodium: 139 mEq/L (ref 137–147)

## 2014-05-08 MED ORDER — SODIUM CHLORIDE 0.9 % IV SOLN: 100.0000 mL | INTRAVENOUS | Status: DC | PRN

## 2014-05-08 MED ORDER — PENTAFLUOROPROP-TETRAFLUOROETH EX AERO: 1.0000 "application " | INHALATION_SPRAY | CUTANEOUS | Status: DC | PRN

## 2014-05-08 MED ORDER — LIDOCAINE-PRILOCAINE 2.5-2.5 % EX CREA: 1.0000 "application " | TOPICAL_CREAM | CUTANEOUS | Status: DC | PRN

## 2014-05-08 MED ORDER — POLYETHYLENE GLYCOL 3350 17 G PO PACK
17.0000 g | PACK | Freq: Every day | ORAL | Status: DC
  Filled 2014-05-08: qty 1

## 2014-05-08 MED ORDER — ACETAMINOPHEN 325 MG PO TABS
ORAL_TABLET | ORAL | Status: AC
  Filled 2014-05-08: qty 2

## 2014-05-08 MED ORDER — DOCUSATE SODIUM 100 MG PO CAPS
100.0000 mg | ORAL_CAPSULE | Freq: Two times a day (BID) | ORAL | Status: DC
  Administered 2014-05-08: 100 mg via ORAL

## 2014-05-08 MED ORDER — HEPARIN BOLUS VIA INFUSION
1000.0000 [IU] | Freq: Once | INTRAVENOUS | Status: AC
  Administered 2014-05-08: 1000 [IU] via INTRAVENOUS

## 2014-05-08 MED ORDER — LIDOCAINE HCL (PF) 1 % IJ SOLN: 5.0000 mL | INTRAMUSCULAR | Status: DC | PRN

## 2014-05-08 NOTE — Progress Notes (Signed)
Report called to rhonda at [facility].  All questions answered.

## 2014-05-08 NOTE — Progress Notes (Signed)
[facility] Rounding Note Subjective:    Hands are still mittened Seen in HD Less confused Recognizes me today (for the first time in 2 days) Says no pain or SOB  Objective Filed Vitals:   05/07/14 2119 05/08/14 0413 05/08/14 0715 05/08/14 0730  BP: 129/67 128/69 103/60 106/58  Pulse: 79 84 81 78  Temp: 98.2 F (36.8 C) 98.1 F (36.7 C) 98 F (36.7 C)   TempSrc: Oral Oral Oral   Resp: 18 18 18    Height:      Weight: 73.599 kg (162 lb 4.1 oz)  75.4 kg (166 lb 3.6 oz)   SpO2: 99% 98% 98%    Physical Exam General: Seen in HD.  NAD.  Hands still in mittens "what's with this" he says Heart: Regular S1S2 No S3 Lungs: Clear Abdomen: soft, nontender +BS Minimal RLQ tenderness to deep palpation only Extremities: R foot in padded boot. L AKA no edema. Mepilex dressing on right knee covering some scabbed over areas  Dialysis Access: L AVG +b/t  Additional Objective   Labs: No pre HD labs back yet (48/27)  Basic Metabolic Panel:  Recent Labs  05/06/14 0344 05/07/14 0430  NA 136* 137  K 4.6 4.4  CL 96 98  CO2 22 21  GLUCOSE 64* 66*  BUN 20 28*  CREATININE 3.63* 4.87*  CALCIUM 8.3* 9.0  PHOS 3.3 3.6   Liver Function Tests:  Recent Labs  05/06/14 0344 05/07/14 0430  ALBUMIN 2.1* 2.2*   CBC:  Recent Labs  05/06/14 0344 05/07/14 0430  WBC 8.0 7.0  HGB 11.5* 11.9*  HCT 38.0* 38.8*  MCV 82.6 82.2  PLT 279 271   Medications:  . aspirin EC  81 mg Oral Daily  . atorvastatin  20 mg Oral QHS  . bacitracin   Topical BID  . feeding supplement (PRO-STAT SUGAR FREE 64)  30 mL Oral BID BM  . gabapentin  100 mg Oral QHS  . heparin subcutaneous  5,000 Units Subcutaneous 3 times per day  . Influenza vac split quadrivalent PF  0.5 mL Intramuscular Tomorrow-1000  . insulin aspart  0-9 Units Subcutaneous TID WC  . midodrine  10 mg Oral TID WC  . QUEtiapine  12.5 mg Oral QHS   Dialysis Orders: MWF @ [place] 4hrs 74 kg 2K/2Ca 400/800 Profile  4 Heparin 5000 UAVG @ LFA  No Hectorol Aranesp 160 mcg on Thursholding this adm due to Hb over goal No Venofer  Assessment/Plan: 1. Ischemic colitis. NG out. WBC has normalized. Minimal tenderness on exam. cipro and flagyl- completed 2. ESRD - HD on MWF @ [place]- no heparin - no volume off last HD to avoid hypotension. Will get his next treatment on Friday. 3. Hypotension - improved- now 133/74 Was started on midodrine on admission  Will want to definitely continue this at discharge. 4. Volume - last reached EDW at outpt unit of 74 kg on 11/13- had been leaving above. Slightly under edw now- reassess at DC 5. Anemia - Hgb 11.9 stable and above goal. 11/27 lab pending. Gets 160 of Aranesp a week at outpt unit - holding for right now - has varied some this adm- watch CBC 6. Metabolic bone disease -phos 3.6. Last outpt iPTH 334 (parathyroidectomy in 2011); no binders at present. 7. Nutrition - Alb 2.2, full liquid diet vitamin. 8. Cholecystitis - in 01/2014, s/p perc chole tube, removed after cholangiogram showed patent cystic duct. Not an inssue currently 9. DM Type 2 - diet  controlled, last Hgb A1C 6.5. 10. Hx CAD - evaluated for chest pain 7/4-12/2013, LBBB per EKG, negative stress test, but decreasing EF, refused cardiac cath. 11. Hx CVA - most recently in 09/2011, also in 2000. 12. Left AKA - stump still not entirely healed. Prior wound VAC. Dressing changes. 13. Confusion - seems more oriented today - clearer  Jamal Maes, MD [facility] [phone] Pager 05/08/2014, 8:07 AM

## 2014-05-08 NOTE — Progress Notes (Signed)
[facility] Progress Note     Subjective: Pt says he's doing well.  Currently in HD.  No N/V, tolerating soft diet.  Last BM on 11/25.  Appears pleasant and minimally confused, but has mitts on.  He seems very cooperative with the nurses at HD.   Objective: Vital signs in last 24 hours: Temp:  [98 F (36.7 C)-98.2 F (36.8 C)] 98 F (36.7 C) (11/27 0715) Pulse Rate:  [77-97] 77 (11/27 0829) Resp:  [18] 18 (11/27 0715) BP: (101-133)/(58-74) 105/61 mmHg (11/27 0829) SpO2:  [95 %-99 %] 98 % (11/27 0715) Weight:  [162 lb 4.1 oz (73.599 kg)-166 lb 3.6 oz (75.4 kg)] 166 lb 3.6 oz (75.4 kg) (11/27 0715) Last BM Date: 05/06/14  Intake/Output from previous day: 11/26 0701 - 11/27 0700 In: 110 [P.O.:110] Out: -  Intake/Output this shift:    PE: Gen:  Alert, NAD, pleasant Abd: Soft, NT/ND, +BS, no HSM, abdominal scars noted   Lab Results:   Recent Labs  05/07/14 0430 05/08/14 0755  WBC 7.0 6.0  HGB 11.9* 11.9*  HCT 38.8* 38.2*  PLT 271 303   BMET  Recent Labs  05/07/14 0430 05/08/14 0733  NA 137 139  K 4.4 4.0  CL 98 98  CO2 21 25  GLUCOSE 66* 86  BUN 28* 39*  CREATININE 4.87* 5.94*  CALCIUM 9.0 8.4   PT/INR No results for input(s): LABPROT, INR in the last 72 hours. CMP     Component Value Date/Time   NA 139 05/08/2014 0733   K 4.0 05/08/2014 0733   CL 98 05/08/2014 0733   CO2 25 05/08/2014 0733   GLUCOSE 86 05/08/2014 0733   BUN 39* 05/08/2014 0733   CREATININE 5.94* 05/08/2014 0733   CALCIUM 8.4 05/08/2014 0733   PROT 7.1 05/04/2014 0347   ALBUMIN 2.1* 05/08/2014 0733   AST 65* 05/04/2014 0347   ALT 14 05/04/2014 0347   ALKPHOS 105 05/04/2014 0347   BILITOT 0.5 05/04/2014 0347   GFRNONAA 8* 05/08/2014 0733   GFRAA 10* 05/08/2014 0733   Lipase     Component Value Date/Time   LIPASE 25 05/03/2014 0805       Studies/Results: No results found.  Anti-infectives: Anti-infectives    Start     Dose/Rate Route Frequency  Ordered Stop   05/04/14 1500  ciprofloxacin (CIPRO) IVPB 400 mg     400 mg200 mL/hr over 60 Minutes Intravenous Every 24 hours 05/03/14 1341 05/06/14 2359   05/03/14 2000  metroNIDAZOLE (FLAGYL) IVPB 500 mg     500 mg100 mL/hr over 60 Minutes Intravenous Every 8 hours 05/03/14 1511 05/06/14 2359   05/03/14 1600  metroNIDAZOLE (FLAGYL) IVPB 500 mg  Status:  Discontinued     500 mg100 mL/hr over 60 Minutes Intravenous Every 8 hours 05/03/14 1554 05/03/14 1647   05/03/14 1400  ciprofloxacin (CIPRO) IVPB 400 mg     400 mg200 mL/hr over 60 Minutes Intravenous  Once 05/03/14 1340 05/03/14 1538   05/03/14 1130  metroNIDAZOLE (FLAGYL) IVPB 500 mg     500 mg100 mL/hr over 60 Minutes Intravenous  Once 05/03/14 1126 05/03/14 1343       Assessment/Plan Ischemic colitis Constipation  Plan: 1. Tolerating soft diet, recommend adding a bowel regimen 2. On floor, but in HD currently 3. Ambulate and IS as able 4. SCD's and lovenox 5. Pt's ischemic colitis seems to have resolved.  He's ready for d/c from our perspective, but looks like he still has some  medical issues holding up his discharge.  He's also confused intermittently and has mitts on hands.  Plans to return to [facility]. 6.  PT/OT ordered, but I do not see a note yet 7.  Will sign off, call with questions/concerns.  Does not need specific surgical follow up.    LOS: 5 days    Coralie Keens 05/08/2014, 9:21 AM Pager: [phone]

## 2014-05-08 NOTE — Progress Notes (Signed)
OT Cancellation Note  Patient Details Name: Sean [name] MRN: [id number] DOB: Oct 29, 1935   Cancelled Treatment:    Reason Eval/Treat Not Completed: Patient at procedure or test/ unavailable. Pt a HD this am when OT checked by. Will reattempt later time.  Jules Schick  [phone] 05/08/2014, 12:13 PM

## 2014-05-08 NOTE — Clinical Social Work Note (Signed)
Patient medically stable for discharge back to [facility] skilled nursing facility. Discharge information transmitted to facility. Son, Teryl Mcconaghy [phone]) contacted and is aware of discharge. Patient will be transported to facility via ambulance.  Gladyce Mcray Givens, MSW, LCSW [phone]

## 2014-05-08 NOTE — Progress Notes (Signed)
Hemodialysis- Pt system clotted x3 during HD today. Dr. Lorrene Reid notified, put back on machine per order for last hour. Unable to UF pt d/t frequent clotting and soft bp. Report called to RN on 6E, pt transported back in stable condition.

## 2014-05-08 NOTE — Evaluation (Signed)
Physical Therapy Evaluation Patient Details Name: Sean [name] MRN: [id number] DOB: 12-22-1935 Today's Date: 05/08/2014   History of Present Illness  Pt is a 78 yo male w/ hx of chronic kidney disease on dialysis, PVD status post left AKA, diabetes, dementia, history of prior ischemic colitis requiring colon resection 2011. He resides at a skilled nursing facility. On the date of admission he was undergoing dialysis when he developed abdominal pain and was sent to the emergency department.  Clinical Impression  Pt admitted with the above. Pt currently with functional limitations due to the deficits listed below (see PT Problem List). At the time of PT eval pt was able to perform transfers with +2 assistance. Pt shows ability to stand with the RW well and hold with some support for ~2 minutes. Pt will benefit from skilled PT to increase their independence and safety with mobility to allow discharge to the venue listed below.      Follow Up Recommendations SNF;Supervision/Assistance - 24 hour    Equipment Recommendations  None recommended by PT    Recommendations for Other Services       Precautions / Restrictions Precautions Precautions: Fall Restrictions Weight Bearing Restrictions: No      Mobility  Bed Mobility Overal bed mobility: Needs Assistance Bed Mobility: Supine to Sit     Supine to sit: HOB elevated;Min assist     General bed mobility comments: Pt sitting upright in bed and was able to use bed rails to scoot hips to EOB. Pt was assisted in completing transfer with bed pad.  Transfers Overall transfer level: Needs assistance Equipment used: Rolling walker (2 wheeled);2 person hand held assist Transfers: Sit to/from [facility] Sit to Stand: Min assist;+2 physical assistance;+2 safety/equipment Stand pivot transfers: Mod assist;+2 physical assistance       General transfer comment: Pt was able to transition bed>drop arm recliner with +2 mod  assist. No walker used for stability at this time. Pt also demonstrated the ability to sit>stand with RW. In standing, pt was able to scoot foot to reposition.   Ambulation/Gait             General Gait Details: Deferred  Stairs            Wheelchair Mobility    Modified Rankin (Stroke Patients Only)       Balance Overall balance assessment: Needs assistance Sitting-balance support: Feet supported;Bilateral upper extremity supported Sitting balance-Leahy Scale: Poor     Standing balance support: Bilateral upper extremity supported Standing balance-Leahy Scale: Zero                               Pertinent Vitals/Pain Pain Assessment: No/denies pain (Pt states his residual limb is tender to the touch)    Home Living Family/patient expects to be discharged to:: Skilled nursing facility                      Prior Function Level of Independence: Independent with assistive device(s)         Comments: Kasandra Knudsen for short distances, RW for longer distances. Able to bathe and dress independently. Able to negotiate some steps at home per dtr.      Hand Dominance   Dominant Hand: Right    Extremity/Trunk Assessment   Upper Extremity Assessment: Defer to OT evaluation           Lower Extremity Assessment: LLE deficits/detail  LLE Deficits / Details: L AKA  Cervical / Trunk Assessment: Normal  Communication   Communication: HOH  Cognition Arousal/Alertness: Awake/alert Behavior During Therapy: WFL for tasks assessed/performed Overall Cognitive Status: Within Functional Limits for tasks assessed                      General Comments      Exercises        Assessment/Plan    PT Assessment Patient needs continued PT services  PT Diagnosis Generalized weakness;Abnormality of gait   PT Problem List Decreased strength;Decreased range of motion;Decreased activity tolerance;Decreased balance;Decreased mobility;Decreased  knowledge of use of DME;Decreased safety awareness;Decreased knowledge of precautions  PT Treatment Interventions DME instruction;Gait training;Stair training;Functional mobility training;Therapeutic activities;Therapeutic exercise;Neuromuscular re-education;Patient/family education   PT Goals (Current goals can be found in the Care Plan section) Acute Rehab PT Goals Patient Stated Goal: Return to his rehab PT Goal Formulation: With patient Time For Goal Achievement: 05/22/14 Potential to Achieve Goals: Good    Frequency Min 2X/week   Barriers to discharge        Co-evaluation               End of Session Equipment Utilized During Treatment: Gait belt Activity Tolerance: Patient tolerated treatment well Patient left: in chair;with chair alarm set;with call bell/phone within reach Nurse Communication: Mobility status         Time: 1329-1401 PT Time Calculation (min) (ACUTE ONLY): 32 min   Charges:   PT Evaluation $Initial PT Evaluation Tier I: 1 Procedure PT Treatments $Gait Training: 8-22 mins $Therapeutic Activity: 8-22 mins   PT G Codes:          Rolinda Roan 05/08/2014, 2:43 PM   Rolinda Roan, PT, DPT Acute Rehabilitation Services Pager: [phone]

## 2014-05-08 NOTE — Care Management Note (Signed)
CARE MANAGEMENT NOTE 05/08/2014  Patient:  Sean [name], Sean [name]   Account Number:  [id number]  Date Initiated:  05/04/2014  Documentation initiated by:  [facility]  Subjective/Objective Assessment:   Colitis, ischemic/PSBO, hypotension     Action/Plan:   from SNF--[facility]   Anticipated DC Date:  05/08/2014   Anticipated DC Plan:  [place]  In-house referral  Clinical Social Worker      DC Planning Services  CM consult      Choice offered to / List presented to:             Status of service:  Completed, signed off Medicare Important Message given?  YES (If response is "NO", the following Medicare IM given date fields will be blank) Date Medicare IM given:  05/06/2014 Medicare IM given by:  Marvetta Gibbons Date Additional Medicare IM given:  05/08/2014 Additional Medicare IM given by:  [facility]  Discharge Disposition:  [place]  Per UR Regulation:    If discussed at Long Length of Stay Meetings, dates discussed:    Comments:  05/04/2014 1600 Plan is for dc back to SNF. Jonnie Finner RN CCM Case Mgmt phone [phone]

## 2014-05-08 NOTE — Progress Notes (Signed)
PT Cancellation Note  Patient Details Name: Sean [name] MRN: [id number] DOB: 1935-11-16   Cancelled Treatment:    Reason Eval/Treat Not Completed: Patient at procedure or test/unavailable. Pt off unit for HD at this time. Will return as schedule allows to complete PT eval.    Rolinda Roan 05/08/2014, 10:31 AM   Rolinda Roan, PT, DPT Acute Rehabilitation Services Pager: [phone]

## 2014-05-08 NOTE — Procedures (Signed)
I have personally attended this patient's dialysis session.   2K bath pending labs No heparin Hb 11.9 so continuing to hold aranesp BP 101/66 with goal to maintain above 90.  Jamal Maes, MD [facility] [phone] Pager 05/08/2014, 8:23 AM

## 2014-05-11 ENCOUNTER — Non-Acute Institutional Stay (SKILLED_NURSING_FACILITY): Payer: Medicare Other | Admitting: Internal Medicine

## 2014-05-11 DIAGNOSIS — N186 End stage renal disease: Secondary | ICD-10-CM

## 2014-05-11 DIAGNOSIS — I70209 Unspecified atherosclerosis of native arteries of extremities, unspecified extremity: Secondary | ICD-10-CM

## 2014-05-11 DIAGNOSIS — I739 Peripheral vascular disease, unspecified: Secondary | ICD-10-CM

## 2014-05-11 DIAGNOSIS — I150 Renovascular hypertension: Secondary | ICD-10-CM

## 2014-05-11 DIAGNOSIS — L98499 Non-pressure chronic ulcer of skin of other sites with unspecified severity: Secondary | ICD-10-CM

## 2014-05-11 DIAGNOSIS — G609 Hereditary and idiopathic neuropathy, unspecified: Secondary | ICD-10-CM

## 2014-05-11 DIAGNOSIS — Z992 Dependence on renal dialysis: Secondary | ICD-10-CM

## 2014-05-11 DIAGNOSIS — M792 Neuralgia and neuritis, unspecified: Secondary | ICD-10-CM

## 2014-05-11 DIAGNOSIS — R5381 Other malaise: Secondary | ICD-10-CM

## 2014-05-11 NOTE — Progress Notes (Signed)
Patient ID: Sean [name], male   DOB: 26-Jan-1936, 78 y.o.   MRN: [id number]     Facility: [facility]    PCP: No primary care provider on file.  Code Status: full code  Allergies  Allergen Reactions  . Benazepril Hcl Hives, Itching and Rash    flareups on patient head    Chief Complaint  Patient presents with  . New Admit To SNF     HPI:  78 y/o male pt is here for STR after hospital admission from 05/03/14-05/08/14 from dialysis centre for acute onset abdominal pain and hypotension. General surgery and critical care was consulted. NG tube was placed. Ct abdomen was s/o colitis. Midodrine was started. He was empirically started on antibiotics which was discontinued after 3 days. The pain was thought to be from ischemic colitis.  Ng tube removed and his diet was advanced as tolerated. He has pmh of chronic kidney disease on dialysis, peripheral vascular disease status post left AKA, diabetes, dementia, history of prior ischemic colitis requiring colon resection 2011.  He is seen in his room today. He is s/p dialysis with bp on recheck being normal < 140/90. He denies any concerns  Review of Systems:  Constitutional: Negative for fever, chills, diaphoresis.  HENT: Negative for congestion Eyes: Negative for eye pain, blurred vision, double vision and discharge.  Respiratory: Negative for cough, sputum production, shortness of breath and wheezing.   Cardiovascular: Negative for chest pain, palpitations  Gastrointestinal: Negative for heartburn, nausea, vomiting, abdominal pain,diarrhea and constipation. appetite is good Musculoskeletal: Negative for back pain, falls Skin: Negative for itching, sores and rash.  Neurological: Negative for weakness,dizziness, tingling, focal weakness and headaches.  Psychiatric/Behavioral: Negative for depression  Past Medical History  Diagnosis Date  . Hyperparathyroidism   . Arthritis   . Hyperlipidemia   .  Hypertension   . Neuromuscular disorder   . Stroke   . GERD (gastroesophageal reflux disease)   . Migraine   . Heart attack 2011; 1990's  . Peripheral neuropathy   . Anxiety   . Depression   . Dialysis patient     M, W, F; [place] CAD (coronary artery disease)     a. s/p NSTEMI and failed RCA PCI in 2011. b. Nuc 12/2013: nonischemic, prior inferior MI.  Marland Kitchen LBBB (left bundle branch block)   . Calciphylaxis     a. Multiple admissions for nonhealing L ulcer/sepsis, s/p L AKA 02/02/2014.  Marland Kitchen Chronic anemia   . Dementia   . Type II diabetes mellitus     not taking any medications.  . ESRD (end stage renal disease) on dialysis   . Renal insufficiency    Past Surgical History  Procedure Laterality Date  . Colon surgery  2011    colon resection   . Parathyroidectomy  2011  . Capd insertion  09/12/2011    Procedure: LAPAROSCOPIC INSERTION CONTINUOUS AMBULATORY PERITONEAL DIALYSIS  (CAPD) CATHETER;  Surgeon: Edward Jolly, MD;  Location: [place];  Service: General;  Laterality: N/A;  . Sp av dialysis shunt access existing *l*  2003  . Dg av dialysis shunt intro ndl*r* or      "not working" (09/19/11)  . Capd removal  03/12/2012    Procedure: CONTINUOUS AMBULATORY PERITONEAL DIALYSIS  (CAPD) CATHETER REMOVAL;  Surgeon: Edward Jolly, MD;  Location: [place];  Service: General;  Laterality: N/A;  . Amputation Left 02/02/2014    Procedure: AMPUTATION ABOVE KNEE-LEFT;  Surgeon:  Elam Dutch, MD;  Location: [place];  Service: Vascular;  Laterality: Left;  . I&d extremity Left 03/31/2014    Procedure: IRRIGATION AND DEBRIDEMENT EXTREMITY - LEFT ABOVE KNEE AMPUTATION;  Surgeon: Elam Dutch, MD;  Location: [place];  Service: Vascular;  Laterality: Left;  . Application of wound vac Left 03/31/2014    Procedure: APPLICATION OF WOUND VAC - LEFT AKA;  Surgeon: Elam Dutch, MD;  Location: [place];  Service: Vascular;  Laterality: Left;   Social History:   reports that he quit  smoking about 37 years ago. His smoking use included Cigarettes. He has a 1.75 pack-year smoking history. He has never used smokeless tobacco. He reports that he does not drink alcohol or use illicit drugs.  Family History  Problem Relation Age of Onset  . Cancer Sister     breast  . Cancer Brother     lung  . Anesthesia problems Neg Hx   . Hypotension Neg Hx   . Malignant hyperthermia Neg Hx   . Pseudochol deficiency Neg Hx     Medications: Patient's Medications  New Prescriptions   No medications on file  Previous Medications   ACETAMINOPHEN (TYLENOL) 325 MG TABLET    Take 2 tablets (650 mg total) by mouth every 6 (six) hours as needed for mild pain, fever or headache.   ALBUTEROL (PROVENTIL) (2.5 MG/3ML) 0.083% NEBULIZER SOLUTION    Take 3 mLs (2.5 mg total) by nebulization every 6 (six) hours as needed for wheezing or shortness of breath.   AMINO ACIDS-PROTEIN HYDROLYS (FEEDING SUPPLEMENT, PRO-STAT SUGAR FREE 64,) LIQD    Take 30 mLs by mouth 2 (two) times daily between meals.   ASPIRIN EC 81 MG TABLET    Take 81 mg by mouth daily.   ATORVASTATIN (LIPITOR) 20 MG TABLET    Take 20 mg by mouth at bedtime.   B COMPLEX-VITAMIN C-FOLIC ACID (NEPHRO-VITE) 0.8 MG TABS    Take 0.8 mg by mouth at bedtime.   FEEDING SUPPLEMENT, RESOURCE BREEZE, (RESOURCE BREEZE) LIQD    Take 1 Container by mouth 2 (two) times daily between meals.   GABAPENTIN (NEURONTIN) 100 MG CAPSULE    Take 100 mg by mouth at bedtime.    HYDROCODONE-ACETAMINOPHEN (NORCO/VICODIN) 5-325 MG PER TABLET    Take 1 tablet by mouth every 6 (six) hours as needed for moderate pain.   MIDODRINE (PROAMATINE) 10 MG TABLET    Take 1 tablet (10 mg total) by mouth 3 (three) times daily with meals.   QUETIAPINE (SEROQUEL) 12.5 MG TABS TABLET    Take 0.5 tablets (12.5 mg total) by mouth at bedtime.  Modified Medications   No medications on file  Discontinued Medications   No medications on file     Physical Exam: Filed Vitals:    05/11/14 1342  BP: 166/85  Pulse: 81  Temp: 98.3 F (36.8 C)  Resp: 18  Weight: 163 lb 12.8 oz (74.299 kg)  SpO2: 95%    General- elderly male in no acute distress Head- atraumatic, normocephalic Eyes- PERRLA, EOMI, no pallor, no icterus, no discharge Neck- no cervical lymphadenopathy Cardiovascular- normal s1,s2, no murmurs Respiratory- bilateral clear to auscultation, no wheeze, no rhonchi, no crackles, no use of accessory muscles Abdomen- bowel sounds present, soft, non tender Musculoskeletal- left AKA, right foot in una boot. On wheelchair. Good right distal pulse. Able to move all extremities Neurological- no focal deficit Skin- warm and dry. Left AVF site has good thrill. Left AKA  stump surgical wound with dressing in place.  Psychiatry- alert and oriented to person, place and time, normal mood and affect    Labs reviewed: Basic Metabolic Panel:  Recent Labs  02/04/14 0350  05/06/14 0344 05/07/14 0430 05/08/14 0733  NA 137  < > 136* 137 139  K 4.0  < > 4.6 4.4 4.0  CL 98  < > 96 98 98  CO2 23  < > 22 21 25   GLUCOSE 168*  < > 64* 66* 86  BUN 27*  < > 20 28* 39*  CREATININE 6.02*  < > 3.63* 4.87* 5.94*  CALCIUM 8.4  < > 8.3* 9.0 8.4  MG 1.9  --   --   --   --   PHOS 4.0  < > 3.3 3.6 4.2  < > = values in this interval not displayed. Liver Function Tests:  Recent Labs  02/05/14 0300  05/03/14 0805 05/04/14 0347  05/06/14 0344 05/07/14 0430 05/08/14 0733  AST 38*  --  76* 65*  --   --   --   --   ALT <5  --  15 14  --   --   --   --   ALKPHOS 86  --  109 105  --   --   --   --   BILITOT 0.5  --  0.5 0.5  --   --   --   --   PROT 5.8*  --  7.9 7.1  --   --   --   --   ALBUMIN 1.2*  < > 2.5* 2.1*  < > 2.1* 2.2* 2.1*  < > = values in this interval not displayed.  Recent Labs  05/03/14 0805  LIPASE 25   No results for input(s): AMMONIA in the last 8760 hours. CBC:  Recent Labs  01/17/14 0940  01/27/14 2100  05/03/14 0805  05/06/14 0344  05/07/14 0430 05/08/14 0755  WBC 14.4*  < > 10.9*  < > 18.0*  < > 8.0 7.0 6.0  NEUTROABS 11.8*  --  8.1*  --  15.0*  --   --   --   --   HGB 7.2*  < > 8.5*  < > 11.4*  < > 11.5* 11.9* 11.9*  HCT 23.6*  < > 27.4*  < > 38.1*  < > 38.0* 38.8* 38.2*  MCV 83.7  < > 86.2  < > 86.2  < > 82.6 82.2 81.1  PLT 423*  < > 295  < > 270  < > 279 271 303  < > = values in this interval not displayed. Cardiac Enzymes:  Recent Labs  02/03/14 1030 02/03/14 1540 02/03/14 2257  CKTOTAL 157  --   --   CKMB 3.2  --   --   TROPONINI 1.48* 2.58* 4.39*   BNP: Invalid input(s): POCBNP CBG:  Recent Labs  05/08/14 0417 05/08/14 1300 05/08/14 1708  GLUCAP 77 71 142*    Assessment/Plan  Physical deconditioning Will have him work with physical therapy and occupational therapy team to help with gait training and muscle strengthening exercises.fall precautions. Skin care. Encourage to be out of bed.   Renovascular Hypotension bp currently stable. Continue midodrine 10 mg tid and monitor bp  End stage renal disease on dialysis Stable. Continue HD three times a week (Mon, Wed, Fri), 1200cc fluid restriction, and renal diet.   Delirium/agitation In setting of ? Vascular dementia. Continue seroquel 12.5 mg daily  PVD  with ulceration Improving. Continue wound care to left AKA site with hydrogel and apply shrinker. Continue skin care to right leg wound. Continue nutritional supports and monitor. Continue aspirin  Peripheral neuropathy Stable. Continue gabapentin 100 mg daily, tylenol 650mg  Q6H PRN, and norco 10/325mg  q4H PRN. Continue to monitor.    Family/ staff Communication: reviewed care plan with patient and nursing supervisor   Goals of care: short term rehabilitation    Labs/tests ordered- none    [name], MD  [facility] [phone] (Monday-Friday 8 am - 5 pm) [phone] (afterhours)

## 2014-05-13 ENCOUNTER — Encounter: Payer: Self-pay | Admitting: Vascular Surgery

## 2014-05-14 ENCOUNTER — Encounter: Payer: Self-pay | Admitting: Vascular Surgery

## 2014-05-14 ENCOUNTER — Ambulatory Visit (INDEPENDENT_AMBULATORY_CARE_PROVIDER_SITE_OTHER): Payer: Self-pay | Admitting: Vascular Surgery

## 2014-05-14 VITALS — BP 102/55 | HR 89 | Temp 98.4°F | Ht 65.0 in | Wt 163.0 lb

## 2014-05-14 DIAGNOSIS — I739 Peripheral vascular disease, unspecified: Secondary | ICD-10-CM

## 2014-05-14 NOTE — Progress Notes (Signed)
Patient is a 78 year old male who is status post left above-knee amputation on August 24. He returns today for followup. He has some dementia. 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 He is currently residing in a skilled nursing facility, [facility].  His dialysis days Monday Wednesday Friday. He is completely nonambulatory and several person assist. He had previously used a VAC dressing and required serial debridement  returns for follow up today.  Physical exam:  Filed Vitals:   05/14/14 1052  BP: 102/55  Pulse: 89  Temp: 98.4 F (36.9 C)  TempSrc: Oral  Height: 5\' 5"  (1.651 m)  Weight: 163 lb (73.936 kg)  SpO2: 100%    Left above-knee amputation well-healed no open ulcerations at this point  Ulcers right leg stable unchanged  Assessment: Healed left above-knee amputation  Plan: Follow-up as needed  Ruta Hinds, MD [facility] Office: [phone] Pager: [phone]

## 2014-05-21 ENCOUNTER — Ambulatory Visit: Payer: Medicare Other | Admitting: Gastroenterology

## 2014-05-22 ENCOUNTER — Non-Acute Institutional Stay (SKILLED_NURSING_FACILITY): Payer: Medicare Other | Admitting: Registered Nurse

## 2014-05-22 DIAGNOSIS — T798XXA Other early complications of trauma, initial encounter: Secondary | ICD-10-CM

## 2014-05-23 LAB — BASIC METABOLIC PANEL
BUN: 26 mg/dL — AB (ref 4–21)
Creatinine: 3.7 mg/dL — AB (ref 0.6–1.3)
Glucose: 80 mg/dL
Potassium: 3.8 mmol/L (ref 3.4–5.3)
Sodium: 135 mmol/L — AB (ref 137–147)

## 2014-05-23 LAB — CBC AND DIFFERENTIAL
HCT: 29 % — AB (ref 41–53)
Hemoglobin: 8.8 g/dL — AB (ref 13.5–17.5)
Platelets: 288 10*3/uL (ref 150–399)
WBC: 4.8 10^3/mL

## 2014-05-24 NOTE — Progress Notes (Signed)
Patient ID: Sean [name], male   DOB: 01/19/1936, 78 y.o.   MRN: [id number]   Place of Service: [facility]  Allergies  Allergen Reactions  . Benazepril Hcl Hives, Itching and Rash    flareups on patient head    Code Status: Full Code  Goals of Care: Longevity/STR  Chief Complaint  Patient presents with  . Acute Visit    possible wound infection    HPI 78 y.o. male with PMH of ESRD, HLD, left AKA stump infection, peripheral neuropathy, secondary hyperparathyroidism, calciphylaxis with nonhealing ulcer among others is being seen for an acute visit at the request of wound RN for worsening calciphylaxis and possible infection of RLE wound. Per wound RN, the wound started out as a small scab, but now has progressed to a small open sore with erythema and increased warm around periwound area. Patient denies fever, chills, headache, dizziness, and pain around wound area.  All other systems were reviewed and negative. He told me he doesn't want to lose his remaining leg.      Past Medical History  Diagnosis Date  . Hyperparathyroidism   . Arthritis   . Hyperlipidemia   . Hypertension   . Neuromuscular disorder   . Stroke   . GERD (gastroesophageal reflux disease)   . Migraine   . Heart attack 2011; 1990's  . Peripheral neuropathy   . Anxiety   . Depression   . Dialysis patient     M, W, F; [place] CAD (coronary artery disease)     a. s/p NSTEMI and failed RCA PCI in 2011. b. Nuc 12/2013: nonischemic, prior inferior MI.  Marland Kitchen LBBB (left bundle branch block)   . Calciphylaxis     a. Multiple admissions for nonhealing L ulcer/sepsis, s/p L AKA 02/02/2014.  Marland Kitchen Chronic anemia   . Dementia   . Type II diabetes mellitus     not taking any medications.  . ESRD (end stage renal disease) on dialysis   . Renal insufficiency     Past Surgical History  Procedure Laterality Date  . Colon surgery  2011    colon resection   . Parathyroidectomy  2011  . Capd  insertion  09/12/2011    Procedure: LAPAROSCOPIC INSERTION CONTINUOUS AMBULATORY PERITONEAL DIALYSIS  (CAPD) CATHETER;  Surgeon: Edward Jolly, MD;  Location: [place];  Service: General;  Laterality: N/A;  . Sp av dialysis shunt access existing *l*  2003  . Dg av dialysis shunt intro ndl*r* or      "not working" (09/19/11)  . Capd removal  03/12/2012    Procedure: CONTINUOUS AMBULATORY PERITONEAL DIALYSIS  (CAPD) CATHETER REMOVAL;  Surgeon: Edward Jolly, MD;  Location: [place];  Service: General;  Laterality: N/A;  . Amputation Left 02/02/2014    Procedure: AMPUTATION ABOVE KNEE-LEFT;  Surgeon: Elam Dutch, MD;  Location: [place];  Service: Vascular;  Laterality: Left;  . I&d extremity Left 03/31/2014    Procedure: IRRIGATION AND DEBRIDEMENT EXTREMITY - LEFT ABOVE KNEE AMPUTATION;  Surgeon: Elam Dutch, MD;  Location: [place];  Service: Vascular;  Laterality: Left;  . Application of wound vac Left 03/31/2014    Procedure: APPLICATION OF WOUND VAC - LEFT AKA;  Surgeon: Elam Dutch, MD;  Location: [place];  Service: Vascular;  Laterality: Left;    History   Social History  . Marital Status: Married    Spouse Name: N/A    Number of Children: N/A  .  Years of Education: N/A   Occupational History  . Not on file.   Social History Main Topics  . Smoking status: Former Smoker -- 0.25 packs/day for 7 years    Types: Cigarettes    Quit date: 06/12/1976  . Smokeless tobacco: Never Used  . Alcohol Use: No  . Drug Use: No  . Sexual Activity: No   Other Topics Concern  . Not on file   Social History Narrative   Family History  Problem Relation Age of Onset  . Cancer Sister     breast  . Cancer Brother     lung  . Anesthesia problems Neg Hx   . Hypotension Neg Hx   . Malignant hyperthermia Neg Hx   . Pseudochol deficiency Neg Hx      Medication List       This list is accurate as of: 05/22/14 11:59 PM.  Always use your most recent med list.                acetaminophen 325 MG tablet  Commonly known as:  TYLENOL  Take 2 tablets (650 mg total) by mouth every 6 (six) hours as needed for mild pain, fever or headache.     albuterol (2.5 MG/3ML) 0.083% nebulizer solution  Commonly known as:  PROVENTIL  Take 3 mLs (2.5 mg total) by nebulization every 6 (six) hours as needed for wheezing or shortness of breath.     aspirin EC 81 MG tablet  Take 81 mg by mouth daily.     atorvastatin 20 MG tablet  Commonly known as:  LIPITOR  Take 20 mg by mouth at bedtime.     b complex-vitamin c-folic acid 0.8 MG Tabs tablet  Take 0.8 mg by mouth at bedtime.     feeding supplement (PRO-STAT SUGAR FREE 64) Liqd  Take 30 mLs by mouth 2 (two) times daily between meals.     feeding supplement (RESOURCE BREEZE) Liqd  Take 1 Container by mouth 2 (two) times daily between meals.     gabapentin 100 MG capsule  Commonly known as:  NEURONTIN  Take 100 mg by mouth at bedtime.     HYDROcodone-acetaminophen 5-325 MG per tablet  Commonly known as:  NORCO/VICODIN  Take 1 tablet by mouth every 6 (six) hours as needed for moderate pain.     midodrine 10 MG tablet  Commonly known as:  PROAMATINE  Take 1 tablet (10 mg total) by mouth 3 (three) times daily with meals.        Physical Exam  BP 105/56 mmHg  Pulse 83  Temp(Src) 98.4 F (36.9 C)  Resp 20  Ht 5\' 5"  (1.651 m)  Wt 161 lb 9.6 oz (73.301 kg)  BMI 26.89 kg/m2  Constitutional: WDWN elderly male in no acute distress. Conversant and pleasant  HEENT: Normocephalic and atraumatic. PERRL. EOM intact. No icterus. Oral mucosa moist. Posterior pharynx clear of any exudate or lesions.  Neck: Supple and nontender. No lymphadenopathy, masses, or thyromegaly. No JVD or carotid bruits. Cardiac: Normal S1, S2. RRR without appreciable murmurs, rubs, or gallops. Right distal pulse 1+.  Left AVF with positive bruit and thrill.  Lungs: No respiratory distress. Breath sounds clear bilaterally without rales, rhonchi, or  wheezes. Abdomen: Audible bowel sounds in all quadrants. Soft, nontender, nondistended.  Musculoskeletal: able to move extremities. No joint erythema or tenderness. Skin: Warm and dry. No rash noted. No erythema. S/p Left AKA with shrinker in place. Multiple healed lesions noted on RLE.Marland Kitchen  Open area about the size of a nickel noted on medial/posterior aspect of of right upper calf with erythema extending about 3-4cm off wound margin. Slighter tender to palpation. Neurological: Alert and oriented to person, place, and time. No focal deficits.  Psychiatric: Judgment and insight adequate. Appropriate mood and affect.   Labs Reviewed CBC Latest Ref Rng 05/08/2014 05/07/2014 05/06/2014  WBC 4.0 - 10.5 K/uL 6.0 7.0 8.0  Hemoglobin 13.0 - 17.0 g/dL 11.9(L) 11.9(L) 11.5(L)  Hematocrit 39.0 - 52.0 % 38.2(L) 38.8(L) 38.0(L)  Platelets 150 - 400 K/uL 303 271 279   CMP Latest Ref Rng 05/08/2014 05/07/2014 05/06/2014  Glucose 70 - 99 mg/dL 86 66(L) 64(L)  BUN 6 - 23 mg/dL 39(H) 28(H) 20  Creatinine 0.50 - 1.35 mg/dL 5.94(H) 4.87(H) 3.63(H)  Sodium 137 - 147 mEq/L 139 137 136(L)  Potassium 3.7 - 5.3 mEq/L 4.0 4.4 4.6  Chloride 96 - 112 mEq/L 98 98 96  CO2 19 - 32 mEq/L 25 21 22   Calcium 8.4 - 10.5 mg/dL 8.4 9.0 8.3(L)  Total Protein 6.0 - 8.3 g/dL - - -  Total Bilirubin 0.3 - 1.2 mg/dL - - -  Alkaline Phos 39 - 117 U/L - - -  AST 0 - 37 U/L - - -  ALT 0 - 53 U/L - - -     Lab Results  Component Value Date   HGBA1C 5.9* 02/06/2014   Lab Results  Component Value Date   TSH 1.050 12/21/2013   Lipid Panel     Component Value Date/Time   CHOL 161 09/20/2011 0822   TRIG 121 09/20/2011 0822   HDL 34* 09/20/2011 0822   CHOLHDL 4.7 09/20/2011 0822   VLDL 24 09/20/2011 0822   LDLCALC 103* 09/20/2011 0822    Assessment & Plan 1. Infected wound, initial encounter Due to his hx of calciphylaxis and nonhealing ulcer with documented sensitivity to Levoquin. Will start him on Levoquin 500mg  x  once, then 250mg  every 48 hours for 2 weeks and florastor 250mg  twice daily for 3 weeks. Continue to monitor    Labs Ordered: CBC w/ diff, cmp  Family/Staff Communication Plan of care discussed with resident and nursing staff. Resident and nursing staff verbalized understanding and agree with plan of care. No additional questions or concerns reported.    Arthur Holms, MSN, AGNP-C [facility] [address] [place], [place] [address] [phone] [8am-5pm] After hours: [phone]

## 2014-05-25 NOTE — Discharge Summary (Signed)
[facility] Discharge Summary   Patient ID:  Sean [name] MRN: [id number] DOB/AGE: Jul 31, 1935 78 y.o.  Admit date: 03/31/2014 Discharge date: 05/25/2014 Date of Surgery: 03/31/2014 Surgeon: Surgeon(s): Elam Dutch, MD  Admission Diagnosis: Nonhealing Surgical Wound Left Above Knee Amputation  Discharge Diagnoses:  Nonhealing Surgical Wound Left Above Knee Amputation  Secondary Diagnoses: Past Medical History  Diagnosis Date  . Hyperparathyroidism   . Arthritis   . Hyperlipidemia   . Hypertension   . Neuromuscular disorder   . Stroke   . GERD (gastroesophageal reflux disease)   . Migraine   . Heart attack 2011; 1990's  . Peripheral neuropathy   . Anxiety   . Depression   . Dialysis patient     M, W, F; [place] CAD (coronary artery disease)     a. s/p NSTEMI and failed RCA PCI in 2011. b. Nuc 12/2013: nonischemic, prior inferior MI.  Marland Kitchen LBBB (left bundle branch block)   . Calciphylaxis     a. Multiple admissions for nonhealing L ulcer/sepsis, s/p L AKA 02/02/2014.  Marland Kitchen Chronic anemia   . Dementia   . Type II diabetes mellitus     not taking any medications.  . ESRD (end stage renal disease) on dialysis   . Renal insufficiency     Procedure(s): IRRIGATION AND DEBRIDEMENT EXTREMITY - LEFT ABOVE KNEE AMPUTATION APPLICATION OF WOUND VAC - LEFT AKA  Discharged Condition: stable  HPI: Patient is a 78 year old male who is status post left above-knee amputation on August 24. He has had increasing drainage from the left above-knee amputation. He is completely nonambulatory and several person assist. He is currently receiving Santyl on the wound and IV vancomycin.    Hospital Course:  Sean [name] is a 78 y.o. male is S/P Left Procedure(s): IRRIGATION AND DEBRIDEMENT EXTREMITY - LEFT ABOVE KNEE AMPUTATION APPLICATION OF WOUND VAC - LEFT AKA Stable post-op care. He is receiving dialysis while he is here. He will be discharged  to SNF with the left AKA wound vac in place. He will follow up with Dr. Oneida Alar in 1 week. He will continue to get vancomycin at dialysis. Pharmacy consult: Pt has been receiving vancomycin at outside facility for MRSA wound infection. Pre-dialysis vancomycin level returned subtheraputic. We recommend floating the right heel and daily dry dressing padding heel. There is a fissure/callolus without open drainage on the right heel.   Consults:  Treatment Team:  Sol Blazing, MD  Significant Diagnostic Studies: CBC Lab Results  Component Value Date   WBC 6.0 05/08/2014   HGB 11.9* 05/08/2014   HCT 38.2* 05/08/2014   MCV 81.1 05/08/2014   PLT 303 05/08/2014    BMET    Component Value Date/Time   NA 139 05/08/2014 0733   K 4.0 05/08/2014 0733   CL 98 05/08/2014 0733   CO2 25 05/08/2014 0733   GLUCOSE 86 05/08/2014 0733   BUN 39* 05/08/2014 0733   CREATININE 5.94* 05/08/2014 0733   CALCIUM 8.4 05/08/2014 0733   GFRNONAA 8* 05/08/2014 0733   GFRAA 10* 05/08/2014 0733   COAG Lab Results  Component Value Date   INR 1.86* 02/04/2014   INR 1.87* 02/04/2014   INR 1.37 01/28/2014     Disposition:  Discharge to :Rehab Discharge Instructions    Call MD for:  redness, tenderness, or signs of infection (pain, swelling, bleeding, redness, odor or green/yellow discharge around incision site)    Complete by:  As directed      Call MD for:  severe or increased pain, loss or decreased feeling  in affected limb(s)    Complete by:  As directed      Call MD for:  temperature >100.5    Complete by:  As directed      Discharge wound care:    Complete by:  As directed   Wound vac at 125 mmHg continuous.     Resume previous diet    Complete by:  As directed             Medication List    STOP taking these medications        aspirin 325 MG tablet     HYDROcodone-acetaminophen 5-325 MG per tablet  Commonly known as:  NORCO/VICODIN     ondansetron 4 MG tablet  Commonly known  as:  ZOFRAN     polyethylene glycol packet  Commonly known as:  MIRALAX / GLYCOLAX     vancomycin 1,500 mg in sodium chloride 0.9 % 500 mL      TAKE these medications        acetaminophen 325 MG tablet  Commonly known as:  TYLENOL  Take 2 tablets (650 mg total) by mouth every 6 (six) hours as needed for mild pain, fever or headache.     albuterol (2.5 MG/3ML) 0.083% nebulizer solution  Commonly known as:  PROVENTIL  Take 3 mLs (2.5 mg total) by nebulization every 6 (six) hours as needed for wheezing or shortness of breath.     atorvastatin 20 MG tablet  Commonly known as:  LIPITOR  Take 20 mg by mouth at bedtime.     b complex-vitamin c-folic acid 0.8 MG Tabs tablet  Take 0.8 mg by mouth at bedtime.     feeding supplement (RESOURCE BREEZE) Liqd  Take 1 Container by mouth 2 (two) times daily between meals.     gabapentin 100 MG capsule  Commonly known as:  NEURONTIN  Take 100 mg by mouth at bedtime.       Verbal and written Discharge instructions given to the patient. Wound care per Discharge AVS     Follow-up Information    Follow up with Elam Dutch, MD In 1 week.   Specialty:  Vascular Surgery   Why:  Office will call you to arrange your appt (sent)   Contact information:   [place] [address] [phone]       Signed: [facility] 05/25/2014, 10:33 AM

## 2014-05-26 ENCOUNTER — Ambulatory Visit (INDEPENDENT_AMBULATORY_CARE_PROVIDER_SITE_OTHER): Payer: Medicare Other | Admitting: Gastroenterology

## 2014-05-26 ENCOUNTER — Other Ambulatory Visit (INDEPENDENT_AMBULATORY_CARE_PROVIDER_SITE_OTHER): Payer: Medicare Other

## 2014-05-26 ENCOUNTER — Encounter: Payer: Self-pay | Admitting: Gastroenterology

## 2014-05-26 VITALS — BP 108/68 | HR 66

## 2014-05-26 DIAGNOSIS — K625 Hemorrhage of anus and rectum: Secondary | ICD-10-CM

## 2014-05-26 DIAGNOSIS — R938 Abnormal findings on diagnostic imaging of other specified body structures: Secondary | ICD-10-CM

## 2014-05-26 DIAGNOSIS — R9389 Abnormal findings on diagnostic imaging of other specified body structures: Secondary | ICD-10-CM

## 2014-05-26 DIAGNOSIS — I749 Embolism and thrombosis of unspecified artery: Secondary | ICD-10-CM

## 2014-05-26 LAB — CBC WITH DIFFERENTIAL/PLATELET
Basophils Absolute: 0 10*3/uL (ref 0.0–0.1)
Basophils Relative: 0.4 % (ref 0.0–3.0)
EOS PCT: 1.9 % (ref 0.0–5.0)
Eosinophils Absolute: 0.1 10*3/uL (ref 0.0–0.7)
HCT: 30.8 % — ABNORMAL LOW (ref 39.0–52.0)
Hemoglobin: 9.5 g/dL — ABNORMAL LOW (ref 13.0–17.0)
Lymphocytes Relative: 16.8 % (ref 12.0–46.0)
Lymphs Abs: 0.9 10*3/uL (ref 0.7–4.0)
MCHC: 30.9 g/dL (ref 30.0–36.0)
MCV: 81.2 fl (ref 78.0–100.0)
MONOS PCT: 18 % — AB (ref 3.0–12.0)
Monocytes Absolute: 0.9 10*3/uL (ref 0.1–1.0)
NEUTROS PCT: 62.9 % (ref 43.0–77.0)
Neutro Abs: 3.2 10*3/uL (ref 1.4–7.7)
PLATELETS: 330 10*3/uL (ref 150.0–400.0)
RBC: 3.79 Mil/uL — ABNORMAL LOW (ref 4.22–5.81)
RDW: 18.7 % — ABNORMAL HIGH (ref 11.5–15.5)
WBC: 5.1 10*3/uL (ref 4.0–10.5)

## 2014-05-26 LAB — C-REACTIVE PROTEIN: CRP: 8.2 mg/dL (ref 0.5–20.0)

## 2014-05-26 MED ORDER — MOVIPREP 100 G PO SOLR: 1.0000 | Freq: Once | ORAL | Status: DC

## 2014-05-26 NOTE — Patient Instructions (Signed)
You have been scheduled for a colonoscopy at [facility] with Dr. Hilarie Fredrickson. Please follow written instructions given to you at your visit today.  Please pick up your prep kit at the pharmacy within the next 1-3 days. If you use inhalers (even only as needed), please bring them with you on the day of your procedure.  Lab came to your room today and drew blood. We will call you with the results.

## 2014-05-29 ENCOUNTER — Other Ambulatory Visit: Payer: Self-pay | Admitting: *Deleted

## 2014-05-29 DIAGNOSIS — K625 Hemorrhage of anus and rectum: Secondary | ICD-10-CM

## 2014-05-29 DIAGNOSIS — R9389 Abnormal findings on diagnostic imaging of other specified body structures: Secondary | ICD-10-CM | POA: Insufficient documentation

## 2014-05-29 NOTE — Progress Notes (Addendum)
05/29/2014 Sean [name] [id number] 06/12/1936   HISTORY OF PRESENT ILLNESS:  This is a 78 year old male with multiple medical problems including hyperlipidemia, hypertension, history of CVA, end-stage renal disease on dialysis, coronary artery disease, anxiety and depression, type 2 diabetes mellitus, mild dementia, PVD, and history of left above-the-knee amputation. He also apparently has a history of partial colon resection in 2011 for an episode of ischemic colitis.  He is here today for rectal bleeding and a recent abnormal CT scan.  CT scan of the abdomen and pelvis with contrast on 05/03/2014 showed the following:  "IMPRESSION: Wall thickening with mild adjacent inflammatory change involving the colon adjacent the anastomosis in the right lower quadrant compatible with acute colitis. Associated colon to small bowel anastomosis appears to be patent with minimal wall thickening of the small bowel immediately proximal to the anastomosis along with several borderline dilated more proximal small bowel loops likely secondary to ileus versus partial obstructing effect at the anastomosis due to the inflamed colon.  Cholelithiasis with evidence of mild gallbladder wall thickening versus adjacent pericholecystic fluid. Right upper quadrant ultrasound may be helpful for further evaluation.  Somewhat small kidneys with numerous cysts unchanged. Indeterminate subcentimeter exophytic left renal nodule unchanged from the recent prior exam. Recommend CT in 6 months.  This recommendation follows ACR consensus guidelines: Managing Incidental Findings on Abdominal CT: White Paper of the ACR Incidental Findings Committee. J Am Coll Radiol 2010;7:754-773  Small incisional hernia adjacent the umbilicus containing only peritoneal fat.  Cardiomegaly with left main and 3 vessel atherosclerotic coronary artery disease. Mild bibasilar atelectasis.  Mild hepatic steatosis."  He does live in  a nursing facility, [facility], and says that he was having rectal bleeding for about 2 weeks, but does not think there has been any blood in his stool recently over the last several days.  His Hgb three weeks ago was 11.9 grams.  He denies any abdominal pain.  No staff from the nursing facility or family was present with him at the time of his visit.   Past Medical History  Diagnosis Date  . Hyperparathyroidism   . Arthritis   . Hyperlipidemia   . Hypertension   . Neuromuscular disorder   . Stroke   . GERD (gastroesophageal reflux disease)   . Migraine   . Heart attack 2011; 1990's  . Peripheral neuropathy   . Anxiety   . Depression   . Dialysis patient     M, W, F; [place] CAD (coronary artery disease)     a. s/p NSTEMI and failed RCA PCI in 2011. b. Nuc 12/2013: nonischemic, prior inferior MI.  Marland Kitchen LBBB (left bundle branch block)   . Calciphylaxis     a. Multiple admissions for nonhealing L ulcer/sepsis, s/p L AKA 02/02/2014.  Marland Kitchen Chronic anemia   . Dementia   . Type II diabetes mellitus     not taking any medications.  . ESRD (end stage renal disease) on dialysis   . Renal insufficiency    Past Surgical History  Procedure Laterality Date  . Colon surgery  2011    colon resection   . Parathyroidectomy  2011  . Capd insertion  09/12/2011    Procedure: LAPAROSCOPIC INSERTION CONTINUOUS AMBULATORY PERITONEAL DIALYSIS  (CAPD) CATHETER;  Surgeon: Edward Jolly, MD;  Location: [place];  Service: General;  Laterality: N/A;  . Sp av dialysis shunt access existing *l*  2003  . Dg av dialysis  shunt intro ndl*r* or      "not working" (09/19/11)  . Capd removal  03/12/2012    Procedure: CONTINUOUS AMBULATORY PERITONEAL DIALYSIS  (CAPD) CATHETER REMOVAL;  Surgeon: Edward Jolly, MD;  Location: [place];  Service: General;  Laterality: N/A;  . Amputation Left 02/02/2014    Procedure: AMPUTATION ABOVE KNEE-LEFT;  Surgeon: Elam Dutch, MD;  Location: [place];  Service:  Vascular;  Laterality: Left;  . I&d extremity Left 03/31/2014    Procedure: IRRIGATION AND DEBRIDEMENT EXTREMITY - LEFT ABOVE KNEE AMPUTATION;  Surgeon: Elam Dutch, MD;  Location: [place];  Service: Vascular;  Laterality: Left;  . Application of wound vac Left 03/31/2014    Procedure: APPLICATION OF WOUND VAC - LEFT AKA;  Surgeon: Elam Dutch, MD;  Location: [place];  Service: Vascular;  Laterality: Left;    reports that he quit smoking about 37 years ago. His smoking use included Cigarettes. He has a 1.75 pack-year smoking history. He has never used smokeless tobacco. He reports that he does not drink alcohol or use illicit drugs. family history includes Cancer in his brother and sister. There is no history of Anesthesia problems, Hypotension, Malignant hyperthermia, or Pseudochol deficiency. Allergies  Allergen Reactions  . Benazepril Hcl Hives, Itching and Rash    flareups on patient head      Outpatient Encounter Prescriptions as of 05/26/2014  Medication Sig  . acetaminophen (TYLENOL) 325 MG tablet Take 2 tablets (650 mg total) by mouth every 6 (six) hours as needed for mild pain, fever or headache.  . albuterol (PROVENTIL) (2.5 MG/3ML) 0.083% nebulizer solution Take 3 mLs (2.5 mg total) by nebulization every 6 (six) hours as needed for wheezing or shortness of breath.  . Amino Acids-Protein Hydrolys (FEEDING SUPPLEMENT, PRO-STAT SUGAR FREE 64,) LIQD Take 30 mLs by mouth 2 (two) times daily between meals.  Marland Kitchen aspirin EC 81 MG tablet Take 81 mg by mouth daily.  Marland Kitchen atorvastatin (LIPITOR) 20 MG tablet Take 20 mg by mouth at bedtime.  Marland Kitchen b complex-vitamin c-folic acid (NEPHRO-VITE) 0.8 MG TABS Take 0.8 mg by mouth at bedtime.  . feeding supplement, RESOURCE BREEZE, (RESOURCE BREEZE) LIQD Take 1 Container by mouth 2 (two) times daily between meals.  . gabapentin (NEURONTIN) 100 MG capsule Take 100 mg by mouth at bedtime.   Marland Kitchen HYDROcodone-acetaminophen (NORCO/VICODIN) 5-325 MG per tablet  Take 1 tablet by mouth every 6 (six) hours as needed for moderate pain.  . midodrine (PROAMATINE) 10 MG tablet Take 1 tablet (10 mg total) by mouth 3 (three) times daily with meals.  . saccharomyces boulardii (FLORASTOR) 250 MG capsule Take 250 mg by mouth 2 (two) times daily.  Marland Kitchen MOVIPREP 100 G SOLR Take 1 kit (200 g total) by mouth once.     REVIEW OF SYSTEMS  : All other systems reviewed and negative except where noted in the History of Present Illness.   PHYSICAL EXAM: BP 108/68 mmHg  Pulse 66  Ht   Wt  General: Well developed black male in no acute distress; in wheelchair Head: Normocephalic and atraumatic Eyes:  Sclerae anicteric, conjunctiva pink. Ears: Normal auditory acuity. Lungs: Clear throughout to auscultation Heart: Regular rate and rhythm Abdomen: Soft, non-distended.  Normal bowel sounds.  Non-tender. Skin: No lesions on visible extremities Extremities: No edema.  Left AKA.  Neurological: Alert oriented x 4, grossly non-focal Psychological:  Alert and cooperative. Normal mood and affect  ASSESSMENT AND PLAN: -78 year old male with multiple medical  problems including a history of partial colectomy for ischemic colitis who presents to our office today with complaints of rectal bleeding and a recent abnormal CT scan showing recurrent colitis.  ? Recurrent ischemia from low-flow state with dialysis and his vascular disease vs IBD/Crohn's.  Hgb was 11.9 grams a few weeks ago.  Discussed with Dr. Hilarie Fredrickson.  Will schedule patient for colonoscopy at [facility] hospital for evaluation.  The risks, benefits, and alternatives were discussed with the patient and he consents to proceed.  Will recheck CBC today along with CRP.   Addendum: Reviewed and agree with initial management. Jerene Bears, MD

## 2014-06-10 ENCOUNTER — Non-Acute Institutional Stay (SKILLED_NURSING_FACILITY): Payer: Medicare Other | Admitting: Registered Nurse

## 2014-06-10 DIAGNOSIS — M792 Neuralgia and neuritis, unspecified: Secondary | ICD-10-CM

## 2014-06-10 DIAGNOSIS — L89610 Pressure ulcer of right heel, unstageable: Secondary | ICD-10-CM

## 2014-06-10 DIAGNOSIS — E785 Hyperlipidemia, unspecified: Secondary | ICD-10-CM

## 2014-06-10 DIAGNOSIS — N186 End stage renal disease: Secondary | ICD-10-CM

## 2014-06-10 DIAGNOSIS — D649 Anemia, unspecified: Secondary | ICD-10-CM

## 2014-06-10 DIAGNOSIS — L97919 Non-pressure chronic ulcer of unspecified part of right lower leg with unspecified severity: Secondary | ICD-10-CM

## 2014-06-10 DIAGNOSIS — R531 Weakness: Secondary | ICD-10-CM

## 2014-06-10 DIAGNOSIS — Z89612 Acquired absence of left leg above knee: Secondary | ICD-10-CM

## 2014-06-10 DIAGNOSIS — Z992 Dependence on renal dialysis: Secondary | ICD-10-CM

## 2014-06-10 DIAGNOSIS — I959 Hypotension, unspecified: Secondary | ICD-10-CM

## 2014-06-10 DIAGNOSIS — G609 Hereditary and idiopathic neuropathy, unspecified: Secondary | ICD-10-CM

## 2014-06-11 ENCOUNTER — Encounter: Payer: Self-pay | Admitting: Registered Nurse

## 2014-06-11 NOTE — Progress Notes (Addendum)
Patient ID: Sean [name], male   DOB: [birth year], 78 y.o.   MRN: [id number]   Place of Service: [facility]  Allergies  Allergen Reactions  . Benazepril Hcl Hives, Itching and Rash    flareups on patient head    Code Status: Full Code  Goals of Care: Longevity/STR?  Chief Complaint  Patient presents with  . Medical Management of Chronic Issues    ESRD, calcyphylaxis, renovascular hypotension, physical weakness, HLD, anemia    HPI 78 y.o. male with PMH of ESRD on HD, caciphylaxis s/p Left AKA, hypotension, HLD, anemia, DM2, depression among others is being seen for a routine visit. Weight overall stable over the past month. No recent falls reported. No change in behaviors or functional status reported. Right heel unstageable ulcer is stable. RLE calciphylaxis ucler is slightly improved: less edematous with some granulation and thinner eschar. Hypotension is stable on midodrine. Neuropathy is stable on neurotin. HLD is stable on lipitor. He continues to make progress with rehab. Seen in room today. No complaints verbalized by patient.   Review of Systems Constitutional: Negative for fever, chills, and fatigue. HENT: Negative for ear pain, congestion, and sore throat Eyes: Negative for eye pain, eye discharge, and visual disturbance  Cardiovascular: Negative for chest pain, palpitations. Positive for swelling of RLE Respiratory: Negative cough, shortness of breath, and wheezing.  Gastrointestinal: Negative for nausea and vomiting. Negative for abdominal pain, diarrhea and constipation.  Endocrine: Negative for polydipsia, polyphagia, and polyuria Musculoskeletal: Negative for back pain, joint pain, and joint swelling.   Neurological: Negative for dizziness and headache. Positive for generalized weakness Skin: Negative for rash. Positive for wounds of RLE Psychiatric: Negative for depression.   Past Medical History  Diagnosis Date  . Hyperparathyroidism   . Arthritis    . Hyperlipidemia   . Hypertension   . Neuromuscular disorder   . Stroke   . GERD (gastroesophageal reflux disease)   . Migraine   . Heart attack 2011; 1990's  . Peripheral neuropathy   . Anxiety   . Depression   . Dialysis patient     M, W, F; [place] CAD (coronary artery disease)     a. s/p NSTEMI and failed RCA PCI in 2011. b. Nuc 12/2013: nonischemic, prior inferior MI.  Marland Kitchen LBBB (left bundle branch block)   . Calciphylaxis     a. Multiple admissions for nonhealing L ulcer/sepsis, s/p L AKA 02/02/2014.  Marland Kitchen Chronic anemia   . Dementia   . Type II diabetes mellitus     not taking any medications.  . ESRD (end stage renal disease) on dialysis   . Renal insufficiency     Past Surgical History  Procedure Laterality Date  . Colon surgery  2011    colon resection   . Parathyroidectomy  2011  . Capd insertion  09/12/2011    Procedure: LAPAROSCOPIC INSERTION CONTINUOUS AMBULATORY PERITONEAL DIALYSIS  (CAPD) CATHETER;  Surgeon: Edward Jolly, MD;  Location: [place];  Service: General;  Laterality: N/A;  . Sp av dialysis shunt access existing *l*  2003  . Dg av dialysis shunt intro ndl*r* or      "not working" (09/19/11)  . Capd removal  03/12/2012    Procedure: CONTINUOUS AMBULATORY PERITONEAL DIALYSIS  (CAPD) CATHETER REMOVAL;  Surgeon: Edward Jolly, MD;  Location: [place];  Service: General;  Laterality: N/A;  . Amputation Left 02/02/2014    Procedure: AMPUTATION ABOVE KNEE-LEFT;  Surgeon: Elam Dutch,  MD;  Location: [place];  Service: Vascular;  Laterality: Left;  . I&d extremity Left 03/31/2014    Procedure: IRRIGATION AND DEBRIDEMENT EXTREMITY - LEFT ABOVE KNEE AMPUTATION;  Surgeon: Elam Dutch, MD;  Location: [place];  Service: Vascular;  Laterality: Left;  . Application of wound vac Left 03/31/2014    Procedure: APPLICATION OF WOUND VAC - LEFT AKA;  Surgeon: Elam Dutch, MD;  Location: [place];  Service: Vascular;  Laterality: Left;    History    Social History  . Marital Status: Married    Spouse Name: N/A    Number of Children: N/A  . Years of Education: N/A   Occupational History  . Not on file.   Social History Main Topics  . Smoking status: Former Smoker -- 0.25 packs/day for 7 years    Types: Cigarettes    Quit date: 06/12/1976  . Smokeless tobacco: Never Used  . Alcohol Use: No  . Drug Use: No  . Sexual Activity: No   Other Topics Concern  . Not on file   Social History Narrative    Family History  Problem Relation Age of Onset  . Cancer Sister     breast  . Cancer Brother     lung  . Anesthesia problems Neg Hx   . Hypotension Neg Hx   . Malignant hyperthermia Neg Hx   . Pseudochol deficiency Neg Hx       Medication List       This list is accurate as of: 06/10/14 11:59 PM.  Always use your most recent med list.               acetaminophen 325 MG tablet  Commonly known as:  TYLENOL  Take 2 tablets (650 mg total) by mouth every 6 (six) hours as needed for mild pain, fever or headache.     albuterol (2.5 MG/3ML) 0.083% nebulizer solution  Commonly known as:  PROVENTIL  Take 3 mLs (2.5 mg total) by nebulization every 6 (six) hours as needed for wheezing or shortness of breath.     aspirin EC 81 MG tablet  Take 81 mg by mouth daily.     atorvastatin 20 MG tablet  Commonly known as:  LIPITOR  Take 20 mg by mouth at bedtime.     b complex-vitamin c-folic acid 0.8 MG Tabs tablet  Take 0.8 mg by mouth at bedtime.     feeding supplement (PRO-STAT SUGAR FREE 64) Liqd  Take 30 mLs by mouth 2 (two) times daily between meals.     feeding supplement (RESOURCE BREEZE) Liqd  Take 1 Container by mouth 2 (two) times daily between meals.     gabapentin 100 MG capsule  Commonly known as:  NEURONTIN  Take 100 mg by mouth at bedtime.     HYDROcodone-acetaminophen 5-325 MG per tablet  Commonly known as:  NORCO/VICODIN  Take 1 tablet by mouth every 6 (six) hours as needed for moderate pain.      midodrine 10 MG tablet  Commonly known as:  PROAMATINE  Take 1 tablet (10 mg total) by mouth 3 (three) times daily with meals.     MOVIPREP 100 G Solr  Generic drug:  peg 3350 powder  Take 1 kit (200 g total) by mouth once.     saccharomyces boulardii 250 MG capsule  Commonly known as:  FLORASTOR  Take 250 mg by mouth 2 (two) times daily.        Physical Exam  BP 113/62 mmHg  Pulse 81  Temp(Src) 97.6 F (36.4 C)  Resp 20  Ht 5' 5"  (1.651 m)  Wt 161 lb 6.4 oz (73.211 kg)  BMI 26.86 kg/m2  Constitutional: WDWN elderly male in no acute distress. Conversant and pleasant  HEENT: Normocephalic and atraumatic. PERRL. EOM intact. No icterus. Oral mucosa moist. Posterior pharynx clear of any exudate or lesions.  Neck: Supple and nontender. No lymphadenopathy, masses, or thyromegaly. No JVD or carotid bruits.  Cardiac: Normal S1, S2. RRR without appreciable murmurs, rubs, or gallops. Right distal pulse diminished with trace pedal edema.  Left AVF with positive bruit and thrill.  Lungs: No respiratory distress. Breath sounds clear bilaterally without rales, rhonchi, or wheezes.  Abdomen: Audible bowel sounds in all quadrants. Soft, nontender, nondistended.  Musculoskeletal: able to move extremities. No joint erythema or tenderness.  Skin: Warm and dry. No rash noted. No erythema. S/p Left AKA. Multiple healed lesions noted on RLE.Marland Kitchen Open ulcer of media/posterior aspect of RLE has a mild odor with small amount of drainage present. Eschar is thinner with some granulation noted. Right heel unstageable ulcer 0.5x0.5x0.2cm with hard dry black eschar. Right knee has dry hardened scabbing from calciphylaxis flare Neurological: Alert and oriented to person, place, and time.  Psychiatric: Judgment and insight adequate. Appropriate mood and affect.   Labs Reviewed  CBC Latest Ref Rng 05/26/2014 05/23/2014 05/08/2014  WBC 4.0 - 10.5 K/uL 5.1 4.8 6.0  Hemoglobin 13.0 - 17.0 g/dL 9.5(L) 8.8(A)  11.9(L)  Hematocrit 39.0 - 52.0 % 30.8(L) 29(A) 38.2(L)  Platelets 150.0 - 400.0 K/uL 330.0 288 303    CMP Latest Ref Rng 05/23/2014 05/08/2014 05/07/2014  Glucose 70 - 99 mg/dL - 86 66(L)  BUN 4 - 21 mg/dL 26(A) 39(H) 28(H)  Creatinine 0.6 - 1.3 mg/dL 3.7(A) 5.94(H) 4.87(H)  Sodium 137 - 147 mmol/L 135(A) 139 137  Potassium 3.4 - 5.3 mmol/L 3.8 4.0 4.4  Chloride 96 - 112 mEq/L - 98 98  CO2 19 - 32 mEq/L - 25 21  Calcium 8.4 - 10.5 mg/dL - 8.4 9.0  Total Protein 6.0 - 8.3 g/dL - - -  Total Bilirubin 0.3 - 1.2 mg/dL - - -  Alkaline Phos 39 - 117 U/L - - -  AST 0 - 37 U/L - - -  ALT 0 - 53 U/L - - -    Lab Results  Component Value Date   HGBA1C 5.9* 02/06/2014    Lab Results  Component Value Date   TSH 1.050 12/21/2013    Lipid Panel     Component Value Date/Time   CHOL 157 04/30/2014   TRIG 82 04/30/2014   HDL 39 04/30/2014   CHOLHDL 4.7 09/20/2011 0822   VLDL 24 09/20/2011 0822   LDLCALC 102 04/30/2014    Assessment & Plan 1. End stage renal disease on dialysis Continue HD on MOn, Wed, and Fri, 1200cc fluid restriction, and renal diet. Followed by nephrology  2. Calciphylaxis of right lower extremity with nonhealing ulcer, with unspecified severity Stable with some improvement. Continue wound care and monitor for signs of infection  3. Dyslipidemia LDL 102. Continue lipitor 22m daily and monitor  4. Peripheral neuralgia Stable. Continue gabapentin 1054mdaily, tylenol 65012mvery six hours as needed, and norco 5/325m79mery four hours as needed. Continue to monitor  5. Generalized weakness Stable. Continue to work with PT/OT for gait/strength/balance training. Fall precautions  6. Hypotension, unspecified hypotension type Stable. Continue midodrine 10mg63mee times daily and monitor  BP  7. Unstageable pressure ulcer of right heel Stable. Continue to clean with NS, apply skin prep, and dry dressing every 3 days. Continue to monitor. Pressure ulcer  precautions in place.   8. S/p left aka Wound of Left AKA resolved. No further wound treatment required. Therapy informed and is planning for fitting of prosthetic leg. Fall precautions  9. Anemia Followed by GI for rectal bleeding and abnormal CT scan showing colitis. He denies any bleeding as of late. He is scheduled to get a colonoscopy with Dr. Hilarie Fredrickson at [facility]. Continue to monitor   Family/Staff Communication Plan of care discussed with resident and nursing staff. Resident and nursing staff verbalized understanding and agree with plan of care. No additional questions or concerns reported.    Arthur Holms, MSN, AGNP-C [facility] [address] [place], [place] [address] [phone] [8am-5pm] After hours: [phone]

## 2014-06-16 ENCOUNTER — Other Ambulatory Visit: Payer: Self-pay | Admitting: *Deleted

## 2014-06-16 MED ORDER — HYDROCODONE-ACETAMINOPHEN 5-325 MG PO TABS: 1.0000 | ORAL_TABLET | Freq: Four times a day (QID) | ORAL | Status: DC | PRN

## 2014-06-16 NOTE — Telephone Encounter (Signed)
[facility] 

## 2014-07-02 ENCOUNTER — Telehealth: Payer: Self-pay | Admitting: Internal Medicine

## 2014-07-06 NOTE — Telephone Encounter (Signed)
Pt rescheduled for colon at [facility] 07/17/14@10 :30am. Pt needs to arrive there at 9:30am. Left message for [facility] to call back.

## 2014-07-06 NOTE — Telephone Encounter (Signed)
Spoke with Sean [name] and pt has dialysis on MWF,they got things mixed up. Pts colon moved back to 07/14/14@9 :30am. Vaniqua aware.

## 2014-07-07 ENCOUNTER — Non-Acute Institutional Stay (SKILLED_NURSING_FACILITY): Payer: Medicare Other | Admitting: Registered Nurse

## 2014-07-07 ENCOUNTER — Encounter: Payer: Self-pay | Admitting: Registered Nurse

## 2014-07-07 ENCOUNTER — Other Ambulatory Visit: Payer: Self-pay

## 2014-07-07 ENCOUNTER — Encounter (HOSPITAL_COMMUNITY): Payer: Self-pay | Admitting: *Deleted

## 2014-07-07 DIAGNOSIS — E785 Hyperlipidemia, unspecified: Secondary | ICD-10-CM

## 2014-07-07 DIAGNOSIS — N186 End stage renal disease: Secondary | ICD-10-CM

## 2014-07-07 DIAGNOSIS — Z89612 Acquired absence of left leg above knee: Secondary | ICD-10-CM

## 2014-07-07 DIAGNOSIS — D649 Anemia, unspecified: Secondary | ICD-10-CM

## 2014-07-07 DIAGNOSIS — I959 Hypotension, unspecified: Secondary | ICD-10-CM

## 2014-07-07 DIAGNOSIS — L89610 Pressure ulcer of right heel, unstageable: Secondary | ICD-10-CM

## 2014-07-07 DIAGNOSIS — Z992 Dependence on renal dialysis: Secondary | ICD-10-CM

## 2014-07-07 DIAGNOSIS — L97921 Non-pressure chronic ulcer of unspecified part of left lower leg limited to breakdown of skin: Secondary | ICD-10-CM

## 2014-07-07 DIAGNOSIS — G629 Polyneuropathy, unspecified: Secondary | ICD-10-CM

## 2014-07-07 MED ORDER — PEG-KCL-NACL-NASULF-NA ASC-C 100 G PO SOLR: ORAL | Status: DC

## 2014-07-07 MED ORDER — MOVIPREP 100 G PO SOLR: 1.0000 | Freq: Once | ORAL | Status: DC

## 2014-07-07 NOTE — Progress Notes (Signed)
Patient ID: Sean [name], male   DOB: Mar 06, 1936, 79 y.o.   MRN: [id number]   Place of Service: [facility]  Allergies  Allergen Reactions  . Benazepril Hcl Hives, Itching and Rash    flareups on patient head    Code Status: Full Code  Goals of Care: Longevity/STR?  Chief Complaint  Patient presents with  . Medical Management of Chronic Issues    ESRD, calciphylaxis with ulcers, peripheral neuropathy, PU of Right heel, HLD, s/p AKA    HPI 79 y.o. male with PMH of DM2, ESRD on HD, caciphylaxis, s/p Left AKA, hypotension, HLD, anemia, DM2, depression among others is being seen for a routine visit. Weight stable. No recent falls  reported. No change in behaviors or functional status reported. Right heel unstageable ulcer is stable. RLE calciphylaxis ucler slightly improved. Hypotension is stable on midodrine. HLD is stable on lipitor. ESRD stable with HD three times a week. Neuropathy stable on gabapentin. Seen in room today. No complaints verbalized by patient.   Review of Systems Constitutional: Negative for fever, chills, and fatigue. HENT: Negative for ear pain, congestion, and sore throat Eyes: Negative for eye pain, eye discharge, and visual disturbance  Cardiovascular: Negative for chest pain, palpitations. Positive for swelling of RLE Respiratory: Negative cough, shortness of breath, and wheezing.  Gastrointestinal: Negative for nausea and vomiting. Negative for abdominal pain, diarrhea and constipation.  Endocrine: Negative for polydipsia, polyphagia, and polyuria Musculoskeletal: Negative for back pain, joint pain, and joint swelling.   Neurological: Negative for dizziness and headache. Positive for numbness and tingling of hands/fingers Skin: Negative for rash. Positive for wounds of RLE Psychiatric: Negative for depression.   Past Medical History  Diagnosis Date  . Hyperparathyroidism   . Arthritis   . Hyperlipidemia   . Hypertension   . Neuromuscular  disorder   . Stroke   . GERD (gastroesophageal reflux disease)   . Migraine   . Heart attack 2011; 1990's  . Peripheral neuropathy   . Anxiety   . Depression   . Dialysis patient     M, W, F; [place] CAD (coronary artery disease)     a. s/p NSTEMI and failed RCA PCI in 2011. b. Nuc 12/2013: nonischemic, prior inferior MI.  Marland Kitchen LBBB (left bundle branch block)   . Calciphylaxis     a. Multiple admissions for nonhealing L ulcer/sepsis, s/p L AKA 02/02/2014.  Marland Kitchen Chronic anemia   . Dementia   . ESRD (end stage renal disease) on dialysis   . Renal insufficiency   . Generalized weakness   . Hypotension     renovascular  . History of left bundle branch block   . Type II diabetes mellitus     not taking any medications.    Past Surgical History  Procedure Laterality Date  . Parathyroidectomy  2011  . Capd insertion  09/12/2011    Procedure: LAPAROSCOPIC INSERTION CONTINUOUS AMBULATORY PERITONEAL DIALYSIS  (CAPD) CATHETER;  Surgeon: Edward Jolly, MD;  Location: [place];  Service: General;  Laterality: N/A;  . Sp av dialysis shunt access existing *l*  2003  . Dg av dialysis shunt intro ndl*r* or      "not working" (09/19/11)  . Capd removal  03/12/2012    Procedure: CONTINUOUS AMBULATORY PERITONEAL DIALYSIS  (CAPD) CATHETER REMOVAL;  Surgeon: Edward Jolly, MD;  Location: [place];  Service: General;  Laterality: N/A;  . Amputation Left 02/02/2014    Procedure: AMPUTATION ABOVE  KNEE-LEFT;  Surgeon: Elam Dutch, MD;  Location: [place];  Service: Vascular;  Laterality: Left;  . I&d extremity Left 03/31/2014    Procedure: IRRIGATION AND DEBRIDEMENT EXTREMITY - LEFT ABOVE KNEE AMPUTATION;  Surgeon: Elam Dutch, MD;  Location: [place];  Service: Vascular;  Laterality: Left;  . Application of wound vac Left 03/31/2014    Procedure: APPLICATION OF WOUND VAC - LEFT AKA;  Surgeon: Elam Dutch, MD;  Location: [place];  Service: Vascular;  Laterality: Left;  . Colon surgery   2011    colon resection     History   Social History  . Marital Status: Married    Spouse Name: N/A    Number of Children: N/A  . Years of Education: N/A   Occupational History  . Not on file.   Social History Main Topics  . Smoking status: Former Smoker -- 0.25 packs/day for 7 years    Types: Cigarettes    Quit date: 06/12/1976  . Smokeless tobacco: Never Used  . Alcohol Use: No  . Drug Use: No  . Sexual Activity: No   Other Topics Concern  . Not on file   Social History Narrative    Family History  Problem Relation Age of Onset  . Cancer Sister     breast  . Cancer Brother     lung  . Anesthesia problems Neg Hx   . Hypotension Neg Hx   . Malignant hyperthermia Neg Hx   . Pseudochol deficiency Neg Hx       Medication List       This list is accurate as of: 07/07/14 10:46 PM.  Always use your most recent med list.               acetaminophen 325 MG tablet  Commonly known as:  TYLENOL  Take 2 tablets (650 mg total) by mouth every 6 (six) hours as needed for mild pain, fever or headache.     albuterol (2.5 MG/3ML) 0.083% nebulizer solution  Commonly known as:  PROVENTIL  Take 3 mLs (2.5 mg total) by nebulization every 6 (six) hours as needed for wheezing or shortness of breath.     aspirin EC 81 MG tablet  Take 81 mg by mouth every morning.     atorvastatin 20 MG tablet  Commonly known as:  LIPITOR  Take 20 mg by mouth at bedtime.     b complex-vitamin c-folic acid 0.8 MG Tabs tablet  Take 0.8 mg by mouth at bedtime.     feeding supplement (PRO-STAT SUGAR FREE 64) Liqd  Take 30 mLs by mouth 2 (two) times daily between meals.     feeding supplement (RESOURCE BREEZE) Liqd  Take 1 Container by mouth 2 (two) times daily between meals.     gabapentin 100 MG capsule  Commonly known as:  NEURONTIN  Take 100 mg by mouth at bedtime.     HYDROcodone-acetaminophen 5-325 MG per tablet  Commonly known as:  NORCO/VICODIN  Take 1 tablet by mouth every 6  (six) hours as needed for moderate pain.     midodrine 10 MG tablet  Commonly known as:  PROAMATINE  Take 1 tablet (10 mg total) by mouth 3 (three) times daily with meals.     peg 3350 powder 100 G Solr  Commonly known as:  MOVIPREP  Use as directed     MOVIPREP 100 G Solr  Generic drug:  peg 3350 powder  Take 1 kit (200 g  total) by mouth once.     saccharomyces boulardii 250 MG capsule  Commonly known as:  FLORASTOR  Take 250 mg by mouth 2 (two) times daily.        Physical Exam  BP 123/72 mmHg  Pulse 88  Temp(Src) 97.3 F (36.3 C)  Resp 20  Ht _0  (1.651 m)  Wt 161 lb 6.4 oz (73.211 kg)  BMI 26.86 kg/m2  SpO2 95%  Constitutional: WDWN elderly male in no acute distress. Conversant and pleasant  HEENT: Normocephalic and atraumatic. PERRL. EOM intact. No icterus. Oral mucosa moist. Posterior pharynx clear of any exudate or lesions.  Neck: Supple and nontender. No lymphadenopathy, masses, or thyromegaly. No JVD or carotid bruits.  Cardiac: Normal S1, S2. RRR without appreciable murmurs, rubs, or gallops. Right distal pulse diminished with trace pedal edema.  Left AVF with positive bruit and thrill.  Lungs: No respiratory distress. Breath sounds clear bilaterally without rales, rhonchi, or wheezes.  Abdomen: Audible bowel sounds in all quadrants. Soft, nontender, nondistended.  Musculoskeletal: able to move extremities. No joint erythema or tenderness.  Skin: Warm and dry. No rash noted. No erythema. S/p Left AKA. Right LE calciphylaxis ulcer 6x7cm, irregular round shaped with wound bed 50% eschar, 30% thick, yellow, firmly adherent slough, 20% granulation at wound margins. Wound edges are rolled, white, and thickened. Periwound fragile and macerated. Moderate amount of serosanguineous exudate with foul odor. Unstageable pressure ulcer to right heel 0.2x0.2x0.2cm with 100% covered in semi-hard pale crusty yellow slough.  Neurological: Alert and oriented to person, place, and  time.  Psychiatric: Judgment and insight adequate. Appropriate mood and affect.   Labs Reviewed  CBC Latest Ref Rng 05/26/2014 05/23/2014 05/08/2014  WBC 4.0 - 10.5 K/uL 5.1 4.8 6.0  Hemoglobin 13.0 - 17.0 g/dL 9.5(L) 8.8(A) 11.9(L)  Hematocrit 39.0 - 52.0 % 30.8(L) 29(A) 38.2(L)  Platelets 150.0 - 400.0 K/uL 330.0 288 303    CMP Latest Ref Rng 05/23/2014 05/08/2014 05/07/2014  Glucose 70 - 99 mg/dL - 86 66(L)  BUN 4 - 21 mg/dL 26(A) 39(H) 28(H)  Creatinine 0.6 - 1.3 mg/dL 3.7(A) 5.94(H) 4.87(H)  Sodium 137 - 147 mmol/L 135(A) 139 137  Potassium 3.4 - 5.3 mmol/L 3.8 4.0 4.4  Chloride 96 - 112 mEq/L - 98 98  CO2 19 - 32 mEq/L - 25 21  Calcium 8.4 - 10.5 mg/dL - 8.4 9.0  Total Protein 6.0 - 8.3 g/dL - - -  Total Bilirubin 0.3 - 1.2 mg/dL - - -  Alkaline Phos 39 - 117 U/L - - -  AST 0 - 37 U/L - - -  ALT 0 - 53 U/L - - -    Lab Results  Component Value Date   HGBA1C 5.9* 02/06/2014    Lab Results  Component Value Date   TSH 1.050 12/21/2013    Lipid Panel     Component Value Date/Time   CHOL 157 04/30/2014   TRIG 82 04/30/2014   HDL 39 04/30/2014   CHOLHDL 4.7 09/20/2011 0822   VLDL 24 09/20/2011 0822   LDLCALC 102 04/30/2014    Assessment & Plan 1. End stage renal disease on dialysis is followed by nephrology. Continue HD on MOn, Wed, and Fri, 1200cc fluid restriction, and renal diet. Continue to monitor his status.    2. Calciphylaxis of right lower extremity with nonhealing ulcer, with unspecified severity Some improvement. Continue wound care with 1/4 strength Dakins solution, polysporin ointment, santyl, calcium alginate, dry dressing.   3.  Dyslipidemia LDL 102. Continue lipitor 38m daily and monitor  4. Peripheral neuropathy Stable Continue gabapentin 1065mdaily, tylenol 6503mvery six hours as needed, and norco 5/325m38mery four hours as needed.  5. Hypotension, unspecified hypotension type Stable. Continue midodrine 10mg4mee times daily.  Continue to monitor BP  6. Unstageable pressure ulcer of right heel Stable. Measures 0.2x0.2x0.2cm and is now covered in semi-hard pale crusty yellow slough. Periwound is thick but dry and intact. Continue to clean with NS, apply skin prep, and dry dressing every 3 days. Continue pressure ulcer precautions and monitor.  7. S/p left aka Complete wound healing. Continue fall precautions  8. Anemia Is being followed by GI for rectal bleeding and abnormal CT scan showing ? recurrent colitis. Denies any bleeding today. [facility] Continue to monitor   Family/Staff Communication Plan of care discussed with resident and nursing staff. Resident and nursing staff verbalized understanding and agree with plan of care. No additional questions or concerns reported.    Jerusalem Wert NArthur Holms, AGNP-C [facility] [phone]] After hours: [phone]

## 2014-07-14 ENCOUNTER — Ambulatory Visit (HOSPITAL_COMMUNITY): Payer: Medicare Other | Admitting: Anesthesiology

## 2014-07-14 ENCOUNTER — Other Ambulatory Visit: Payer: Self-pay | Admitting: *Deleted

## 2014-07-14 ENCOUNTER — Encounter (HOSPITAL_COMMUNITY)
Admission: RE | Disposition: A | Discharge status: Home / Self Care | Payer: Self-pay | Source: Ambulatory Visit | Attending: Internal Medicine

## 2014-07-14 ENCOUNTER — Ambulatory Visit (HOSPITAL_COMMUNITY)
Admission: RE | Admit: 2014-07-14 | Discharge: 2014-07-14 | Disposition: A | Discharge status: Home / Self Care | Payer: Medicare Other | Source: Ambulatory Visit | Attending: Internal Medicine | Admitting: Internal Medicine

## 2014-07-14 ENCOUNTER — Encounter (HOSPITAL_COMMUNITY): Payer: Self-pay

## 2014-07-14 DIAGNOSIS — I2510 Atherosclerotic heart disease of native coronary artery without angina pectoris: Secondary | ICD-10-CM | POA: Diagnosis not present

## 2014-07-14 DIAGNOSIS — G43909 Migraine, unspecified, not intractable, without status migrainosus: Secondary | ICD-10-CM | POA: Insufficient documentation

## 2014-07-14 DIAGNOSIS — K559 Vascular disorder of intestine, unspecified: Secondary | ICD-10-CM | POA: Insufficient documentation

## 2014-07-14 DIAGNOSIS — Z87891 Personal history of nicotine dependence: Secondary | ICD-10-CM | POA: Diagnosis not present

## 2014-07-14 DIAGNOSIS — N186 End stage renal disease: Secondary | ICD-10-CM

## 2014-07-14 DIAGNOSIS — M1990 Unspecified osteoarthritis, unspecified site: Secondary | ICD-10-CM | POA: Insufficient documentation

## 2014-07-14 DIAGNOSIS — I120 Hypertensive chronic kidney disease with stage 5 chronic kidney disease or end stage renal disease: Secondary | ICD-10-CM | POA: Insufficient documentation

## 2014-07-14 DIAGNOSIS — I739 Peripheral vascular disease, unspecified: Secondary | ICD-10-CM | POA: Insufficient documentation

## 2014-07-14 DIAGNOSIS — D649 Anemia, unspecified: Secondary | ICD-10-CM | POA: Insufficient documentation

## 2014-07-14 DIAGNOSIS — K648 Other hemorrhoids: Secondary | ICD-10-CM | POA: Insufficient documentation

## 2014-07-14 DIAGNOSIS — F0390 Unspecified dementia without behavioral disturbance: Secondary | ICD-10-CM | POA: Diagnosis not present

## 2014-07-14 DIAGNOSIS — K219 Gastro-esophageal reflux disease without esophagitis: Secondary | ICD-10-CM | POA: Diagnosis not present

## 2014-07-14 DIAGNOSIS — Z8673 Personal history of transient ischemic attack (TIA), and cerebral infarction without residual deficits: Secondary | ICD-10-CM | POA: Insufficient documentation

## 2014-07-14 DIAGNOSIS — R9389 Abnormal findings on diagnostic imaging of other specified body structures: Secondary | ICD-10-CM

## 2014-07-14 DIAGNOSIS — I252 Old myocardial infarction: Secondary | ICD-10-CM | POA: Insufficient documentation

## 2014-07-14 DIAGNOSIS — E119 Type 2 diabetes mellitus without complications: Secondary | ICD-10-CM | POA: Insufficient documentation

## 2014-07-14 DIAGNOSIS — Z992 Dependence on renal dialysis: Secondary | ICD-10-CM | POA: Insufficient documentation

## 2014-07-14 DIAGNOSIS — G629 Polyneuropathy, unspecified: Secondary | ICD-10-CM | POA: Diagnosis not present

## 2014-07-14 DIAGNOSIS — K289 Gastrojejunal ulcer, unspecified as acute or chronic, without hemorrhage or perforation: Secondary | ICD-10-CM

## 2014-07-14 DIAGNOSIS — K625 Hemorrhage of anus and rectum: Secondary | ICD-10-CM

## 2014-07-14 DIAGNOSIS — F329 Major depressive disorder, single episode, unspecified: Secondary | ICD-10-CM | POA: Diagnosis not present

## 2014-07-14 DIAGNOSIS — E785 Hyperlipidemia, unspecified: Secondary | ICD-10-CM | POA: Diagnosis not present

## 2014-07-14 DIAGNOSIS — R938 Abnormal findings on diagnostic imaging of other specified body structures: Secondary | ICD-10-CM

## 2014-07-14 HISTORY — PX: COLONOSCOPY WITH PROPOFOL: SHX5780

## 2014-07-14 HISTORY — DX: Personal history of other diseases of the circulatory system: Z86.79

## 2014-07-14 HISTORY — DX: Hypotension, unspecified: I95.9

## 2014-07-14 LAB — GLUCOSE, CAPILLARY: Glucose-Capillary: 77 mg/dL (ref 70–99)

## 2014-07-14 SURGERY — COLONOSCOPY WITH PROPOFOL
Anesthesia: Monitor Anesthesia Care

## 2014-07-14 MED ORDER — MIDAZOLAM HCL 5 MG/5ML IJ SOLN
INTRAMUSCULAR | Status: DC | PRN
  Administered 2014-07-14 (×3): 0.5 mg via INTRAVENOUS

## 2014-07-14 MED ORDER — PROPOFOL 10 MG/ML IV BOLUS
INTRAVENOUS | Status: AC
  Filled 2014-07-14: qty 20

## 2014-07-14 MED ORDER — MIDAZOLAM HCL 2 MG/2ML IJ SOLN
INTRAMUSCULAR | Status: AC
  Filled 2014-07-14: qty 2

## 2014-07-14 MED ORDER — SODIUM CHLORIDE 0.9 % IV SOLN
INTRAVENOUS | Status: DC
  Administered 2014-07-14: 09:00:00 via INTRAVENOUS

## 2014-07-14 MED ORDER — PHENYLEPHRINE HCL 10 MG/ML IJ SOLN
INTRAMUSCULAR | Status: DC | PRN
  Administered 2014-07-14: 40 ug via INTRAVENOUS
  Administered 2014-07-14: 80 ug via INTRAVENOUS

## 2014-07-14 MED ORDER — PROPOFOL INFUSION 10 MG/ML OPTIME
INTRAVENOUS | Status: DC | PRN
  Administered 2014-07-14: 120 ug/kg/min via INTRAVENOUS

## 2014-07-14 MED ORDER — LIDOCAINE HCL (CARDIAC) 20 MG/ML IV SOLN
INTRAVENOUS | Status: AC
  Filled 2014-07-14: qty 5

## 2014-07-14 MED ORDER — PHENYLEPHRINE 40 MCG/ML (10ML) SYRINGE FOR IV PUSH (FOR BLOOD PRESSURE SUPPORT)
PREFILLED_SYRINGE | INTRAVENOUS | Status: AC
  Filled 2014-07-14: qty 10

## 2014-07-14 MED ORDER — LIDOCAINE HCL 1 % IJ SOLN
INTRAMUSCULAR | Status: DC | PRN
  Administered 2014-07-14: 50 mg via INTRADERMAL

## 2014-07-14 SURGICAL SUPPLY — 22 items

## 2014-07-14 NOTE — Anesthesia Postprocedure Evaluation (Signed)
Anesthesia Post Note  Patient: Sean [name]  Procedure(s) Performed: Procedure(s) (LRB): COLONOSCOPY WITH PROPOFOL (N/A)  Anesthesia type: MAC  Patient location: PACU  Post pain: Pain level controlled  Post assessment: Post-op Vital signs reviewed  Last Vitals: BP 129/54 mmHg  Pulse 81  Temp(Src) 36.6 C (Oral)  Resp 15  Ht 5\' 5"  (1.651 m)  Wt 161 lb (73.029 kg)  BMI 26.79 kg/m2  SpO2 98%  Post vital signs: Reviewed  Level of consciousness: awake  Complications: No apparent anesthesia complications

## 2014-07-14 NOTE — Op Note (Signed)
[facility] [place], [address]   COLONOSCOPY PROCEDURE REPORT  PATIENT: Sean [name], Sean [name]  MR#: [id number] BIRTHDATE: Jun 09, 1936 , 78  yrs. old GENDER: male ENDOSCOPIST: Jerene Bears, MD REFERRED ID:[id number] Forde Dandy, M.D. PROCEDURE DATE:  07/14/2014 PROCEDURE:   Colonoscopy with biopsy First Screening Colonoscopy - Avg.  risk and is 50 yrs.  old or older - No.  Prior Negative Screening - Now for repeat screening. N/A  History of Adenoma - Now for follow-up colonoscopy & has been > or = to 3 yrs.  N/A  Polyps Removed Today? No.  Polyps Removed Today? No.  Recommend repeat exam, <10 yrs? Polyps Removed Today? No.  Recommend repeat exam, <10 yrs? No. ASA CLASS:   Class III INDICATIONS:rectal bleeding and an abnormal CT. MEDICATIONS: Monitored anesthesia care and Per Anesthesia  DESCRIPTION OF PROCEDURE:   After the risks benefits and alternatives of the procedure were thoroughly explained, informed consent was obtained.  The digital rectal exam revealed no rectal mass.   The Pentax Ped Colon [id number]  endoscope was introduced through the anus and advanced to the ileum. No adverse events experienced.   The quality of the prep was good, using MoviPrep The instrument was then slowly withdrawn as the colon was fully examined.   COLON FINDINGS: There was evidence of an inflamed prior surgical anastomosis in the right colon.  Ulceration was present with contact friability and oozing.  Multiple biopsies of the area were performed using cold forceps (suspicion is chronic ischemic insult though IBD cannot be excluded).  The neoterminal ileum was able to be intubated for a short distance and no active inflammation was seen in this segment. The colon mucosa was otherwise normal with no evidence of colonic inflammation ( other than anastomotic). Retroflexed views revealed internal hemorrhoids. The time to cecum=6 minutes 00 seconds.  Withdrawal time=11 minutes  00 seconds. The scope was withdrawn and the procedure completed. COMPLICATIONS: There were no immediate complications.  ENDOSCOPIC IMPRESSION: 1.   There was evidence of prior surgical anastomosis in the right colon with inflammation/ulceration; multiple biopsies of the area were performed using cold forceps, query ischemic injury versus IBD  2.   The examination was otherwise normal, see above  RECOMMENDATIONS: 1.  Await biopsy results 2.  Monitor Hgb and iron stores.  Replace iron as needed  eSigned:  Jerene Bears, MD 07/14/2014 10:25 AM   cc: Reynold Bowen, MD and The Patient

## 2014-07-14 NOTE — Progress Notes (Signed)
Pt was given discharge instructions to take back to facility

## 2014-07-14 NOTE — Discharge Instructions (Signed)
Colonoscopy, Care After °These instructions give you information on caring for yourself after your procedure. Your doctor may also give you more specific instructions. Call your doctor if you have any problems or questions after your procedure. °HOME CARE °· Do not drive for 24 hours. °· Do not sign important papers or use machinery for 24 hours. °· You may shower. °· You may go back to your usual activities, but go slower for the first 24 hours. °· Take rest breaks often during the first 24 hours. °· Walk around or use warm packs on your belly (abdomen) if you have belly cramping or gas. °· Drink enough fluids to keep your pee (urine) clear or pale yellow. °· Resume your normal diet. Avoid heavy or fried foods. °· Avoid drinking alcohol for 24 hours or as told by your doctor. °· Only take medicines as told by your doctor. °If a tissue sample (biopsy) was taken during the procedure:  °· Do not take aspirin or blood thinners for 7 days, or as told by your doctor. °· Do not drink alcohol for 7 days, or as told by your doctor. °· Eat soft foods for the first 24 hours. °GET HELP IF: °You still have a small amount of blood in your poop (stool) 2-3 days after the procedure. °GET HELP RIGHT AWAY IF: °· You have more than a small amount of blood in your poop. °· You see clumps of tissue (blood clots) in your poop. °· Your belly is puffy (swollen). °· You feel sick to your stomach (nauseous) or throw up (vomit). °· You have a fever. °· You have belly pain that gets worse and medicine does not help. °MAKE SURE YOU: °· Understand these instructions. °· Will watch your condition. °· Will get help right away if you are not doing well or get worse. °Document Released: 07/01/2010 Document Revised: 06/03/2013 Document Reviewed: 02/03/2013 °ExitCare® Patient Information ©2015 [employer]. This information is not intended to replace advice given to you by your health care provider. Make sure you discuss any questions you have with  your health care provider. ° °

## 2014-07-14 NOTE — Anesthesia Preprocedure Evaluation (Addendum)
Anesthesia Evaluation  Patient identified by MRN, date of birth, ID band Patient awake    Reviewed: Allergy & Precautions, H&P , NPO status , Patient's Chart, lab work & pertinent test results  Airway Mallampati: II   Neck ROM: full    Dental  (+) Edentulous Upper, Edentulous Lower   Pulmonary former smoker,    Pulmonary exam normal       Cardiovascular hypertension, + CAD, + Past MI and + Peripheral Vascular Disease + dysrhythmias  Cleared by his internist.   Last nuclear test done on 7/5 showed no ischemia but an ejection fraction of 25%. -His chest pain-free  Echo 12/2013 - Left ventricle: Poorly visualized consider repeat with contrastAbnormal septal motion. The cavity size was mildly dilated. Wallthickness was normal. Systolic function was mildly reduced. Theestimated ejection fraction was in the range of 45% to 50%. - Mitral valve: Calcified annulus. - Left atrium: The atrium was mildly dilated. - Atrial septum: No defect or patent foramen ovale was identified.   Neuro/Psych  Headaches, PSYCHIATRIC DISORDERS Anxiety Depression  Neuromuscular disease CVA    GI/Hepatic GERD-  ,  Endo/Other  diabetes, Type 2  Renal/GU ESRF and DialysisRenal disease     Musculoskeletal  (+) Arthritis -,   Abdominal   Peds  Hematology  (+) anemia ,   Anesthesia Other Findings   Reproductive/Obstetrics                           Anesthesia Physical  Anesthesia Plan  ASA: III  Anesthesia Plan: MAC   Post-op Pain Management:    Induction: Intravenous  Airway Management Planned:   Additional Equipment:   Intra-op Plan:   Post-operative Plan:   Informed Consent: I have reviewed the patients History and Physical, chart, labs and discussed the procedure including the risks, benefits and alternatives for the proposed anesthesia with the patient or authorized representative who has indicated his/her  understanding and acceptance.   Dental advisory given  Plan Discussed with: CRNA, Anesthesiologist and Surgeon  Anesthesia Plan Comments:        Anesthesia Quick Evaluation

## 2014-07-14 NOTE — H&P (Signed)
HPI: Sean [name] is a 79 year old male with a complicated medical history including end-stage renal disease on dialysis, history of CVA, hypertension, CAD, diabetes, peripheral vascular disease, left AKA, and history of partial colonic resection after ischemic colitis who presents for colonoscopy. He was seen in the office on 05/29/2014 by Alonza Bogus, PA-C. He was seen to evaluate rectal bleeding but also an abnormal CT scan from November 2015. At that time he is having approximate 3 weeks of bright red blood per rectum which was mostly painless in nature. Reports rectal bleeding has ceased and did not return. He denies abdominal pain. No nausea or vomiting. Tolerated the prep well.  CT scan showed wall thickening with mild inflammatory change in the right lower quadrant near the area of anastomosis. Anastomosis appears patent with minimal wall thickening of the small bowel proximal to the anastomosis.  Past Medical History  Diagnosis Date  . Hyperparathyroidism   . Arthritis   . Hyperlipidemia   . Hypertension   . Neuromuscular disorder   . Stroke   . GERD (gastroesophageal reflux disease)   . Migraine   . Heart attack 2011; 1990's  . Peripheral neuropathy   . Anxiety   . Depression   . Dialysis patient     M, W, F; [place] CAD (coronary artery disease)     a. s/p NSTEMI and failed RCA PCI in 2011. b. Nuc 12/2013: nonischemic, prior inferior MI.  Marland Kitchen LBBB (left bundle branch block)   . Calciphylaxis     a. Multiple admissions for nonhealing L ulcer/sepsis, s/p L AKA 02/02/2014.  Marland Kitchen Chronic anemia   . Dementia   . ESRD (end stage renal disease) on dialysis   . Renal insufficiency   . Generalized weakness   . Hypotension     renovascular  . History of left bundle branch block   . Type II diabetes mellitus     not taking any medications.    Past Surgical History  Procedure Laterality Date  . Parathyroidectomy  2011  . Capd insertion  09/12/2011    Procedure:  LAPAROSCOPIC INSERTION CONTINUOUS AMBULATORY PERITONEAL DIALYSIS  (CAPD) CATHETER;  Surgeon: Edward Jolly, MD;  Location: [facility];  Service: General;  Laterality: N/A;  . Sp av dialysis shunt access existing *l*  2003  . Dg av dialysis shunt intro ndl*r* or      "not working" (09/19/11)  . Capd removal  03/12/2012    Procedure: CONTINUOUS AMBULATORY PERITONEAL DIALYSIS  (CAPD) CATHETER REMOVAL;  Surgeon: Edward Jolly, MD;  Location: [place];  Service: General;  Laterality: N/A;  . Amputation Left 02/02/2014    Procedure: AMPUTATION ABOVE KNEE-LEFT;  Surgeon: Elam Dutch, MD;  Location: [facility];  Service: Vascular;  Laterality: Left;  . I&d extremity Left 03/31/2014    Procedure: IRRIGATION AND DEBRIDEMENT EXTREMITY - LEFT ABOVE KNEE AMPUTATION;  Surgeon: Elam Dutch, MD;  Location: [facility];  Service: Vascular;  Laterality: Left;  . Application of wound vac Left 03/31/2014    Procedure: APPLICATION OF WOUND VAC - LEFT AKA;  Surgeon: Elam Dutch, MD;  Location: [place];  Service: Vascular;  Laterality: Left;  . Colon surgery  2011    colon resection      (Not in an outpatient encounter)  Allergies  Allergen Reactions  . Benazepril Hcl Hives, Itching and Rash    flareups on patient head    Family History  Problem Relation Age of Onset  .  Cancer Sister     breast  . Cancer Brother     lung  . Anesthesia problems Neg Hx   . Hypotension Neg Hx   . Malignant hyperthermia Neg Hx   . Pseudochol deficiency Neg Hx     History  Substance Use Topics  . Smoking status: Former Smoker -- 0.25 packs/day for 7 years    Types: Cigarettes    Quit date: 06/12/1976  . Smokeless tobacco: Never Used  . Alcohol Use: No    ROS: As per history of present illness, otherwise negative  BP 139/64 mmHg  Temp(Src) 97.6 F (36.4 C) (Oral)  Resp 16  Ht 5\' 5"  (1.651 m)  Wt 161 lb (73.029 kg)  BMI 26.79 kg/m2  SpO2 96% Gen: awake, alert, NAD HEENT: anicteric, op clear CV: RRR, no  mrg Pulm: CTA b/l Abd: soft, NT/ND, +BS throughout Ext: no c/c/e, left AKA, fistula left arm Neuro: nonfocal  RELEVANT LABS AND IMAGING: CBC    Component Value Date/Time   WBC 5.1 05/26/2014 1134   WBC 4.8 05/23/2014   RBC 3.79* 05/26/2014 1134   RBC 3.67* 09/19/2011 1356   HGB 9.5* 05/26/2014 1134   HCT 30.8* 05/26/2014 1134   PLT 330.0 05/26/2014 1134   MCV 81.2 05/26/2014 1134   MCH 25.3* 05/08/2014 0755   MCHC 30.9 05/26/2014 1134   RDW 18.7* 05/26/2014 1134   LYMPHSABS 0.9 05/26/2014 1134   MONOABS 0.9 05/26/2014 1134   EOSABS 0.1 05/26/2014 1134   BASOSABS 0.0 05/26/2014 1134    CMP     Component Value Date/Time   NA 135* 05/23/2014   NA 139 05/08/2014 0733   K 3.8 05/23/2014   CL 98 05/08/2014 0733   CO2 25 05/08/2014 0733   GLUCOSE 86 05/08/2014 0733   BUN 26* 05/23/2014   BUN 39* 05/08/2014 0733   CREATININE 3.7* 05/23/2014   CREATININE 5.94* 05/08/2014 0733   CALCIUM 8.4 05/08/2014 0733   PROT 7.1 05/04/2014 0347   ALBUMIN 2.1* 05/08/2014 0733   AST 65* 05/04/2014 0347   ALT 14 05/04/2014 0347   ALKPHOS 105 05/04/2014 0347   BILITOT 0.5 05/04/2014 0347   GFRNONAA 8* 05/08/2014 0733   GFRAA 10* 05/08/2014 0733    ASSESSMENT/PLAN: 79 year old male with a complicated medical history including end-stage renal disease on dialysis, history of CVA, hypertension, CAD, diabetes, peripheral vascular disease, left AKA, and history of partial colonic resection after ischemic colitis who presents for colonoscopy.   1. Rectal bleeding (resolved now), hx of ischemic colitis/abnl CT colon -- patient for colonoscopy today to evaluate recent history of rectal bleeding and also thickening near ileocolonic anastomosis seen by CT scan. Possible recurrent ischemic insult, rule out IBD.The nature of the procedure, as well as the risks, benefits, and alternatives were carefully and thoroughly reviewed with the patient. Ample time for discussion and questions allowed. The  patient understood, was satisfied, and agreed to proceed.

## 2014-07-14 NOTE — Transfer of Care (Signed)
Immediate Anesthesia Transfer of Care Note  Patient: Sean [name]  Procedure(s) Performed: Procedure(s): COLONOSCOPY WITH PROPOFOL (N/A)  Patient Location: PACU and Endoscopy Unit  Anesthesia Type:MAC  Level of Consciousness: sedated and patient cooperative  Airway & Oxygen Therapy: Patient Spontanous Breathing and Patient connected to face mask oxygen  Post-op Assessment: Report given to RN and Post -op Vital signs reviewed and stable  Post vital signs: Reviewed and stable  Last Vitals:  Filed Vitals:   07/14/14 1014  BP: 107/40  Pulse: 77  Temp:   Resp: 24    Complications: No apparent anesthesia complications

## 2014-07-15 ENCOUNTER — Encounter (HOSPITAL_COMMUNITY): Payer: Self-pay | Admitting: Internal Medicine

## 2014-07-17 ENCOUNTER — Encounter: Payer: Self-pay | Admitting: Internal Medicine

## 2014-07-20 ENCOUNTER — Encounter: Payer: Self-pay | Admitting: Vascular Surgery

## 2014-07-21 ENCOUNTER — Ambulatory Visit (INDEPENDENT_AMBULATORY_CARE_PROVIDER_SITE_OTHER)
Admission: RE | Admit: 2014-07-21 | Discharge: 2014-07-21 | Disposition: A | Discharge status: Home / Self Care | Payer: Medicare Other | Source: Ambulatory Visit | Attending: Vascular Surgery | Admitting: Vascular Surgery

## 2014-07-21 ENCOUNTER — Ambulatory Visit (INDEPENDENT_AMBULATORY_CARE_PROVIDER_SITE_OTHER): Payer: Medicare Other | Admitting: Vascular Surgery

## 2014-07-21 ENCOUNTER — Ambulatory Visit (HOSPITAL_COMMUNITY)
Admission: RE | Admit: 2014-07-21 | Discharge: 2014-07-21 | Disposition: A | Discharge status: Home / Self Care | Payer: Medicare Other | Source: Ambulatory Visit | Attending: Vascular Surgery | Admitting: Vascular Surgery

## 2014-07-21 ENCOUNTER — Encounter: Payer: Self-pay | Admitting: Vascular Surgery

## 2014-07-21 VITALS — BP 120/70 | HR 85 | Resp 16 | Ht 65.0 in | Wt 155.0 lb

## 2014-07-21 DIAGNOSIS — N186 End stage renal disease: Secondary | ICD-10-CM

## 2014-07-21 NOTE — Progress Notes (Signed)
Subjective:     Patient ID: Sean [name], male   DOB: 06/16/1935, 79 y.o.   MRN: [id number]  HPI  This 79 are old male was referred for evaluation of new vascular access. He has had a left forearm graft functioning for the past 8 years. It recently clotted and he had thrombosed lysis performed by Dr. James [name] or Dr. J [name] in their outpatient facility. Patient states that has worked well since that time. He dialyzes at the [name]. He denies pain or numbness in the left hand.   Past Medical History  Diagnosis Date  . Hyperparathyroidism   . Arthritis   . Hyperlipidemia   . Hypertension   . Neuromuscular disorder   . Stroke   . GERD (gastroesophageal reflux disease)   . Migraine   . Heart attack 2011; 1990's  . Peripheral neuropathy   . Anxiety   . Depression   . Dialysis patient     M, W, F; [address]  . CAD (coronary artery disease)     a. s/p NSTEMI and failed RCA PCI in 2011. b. Nuc 12/2013: nonischemic, prior inferior MI.  . LBBB (left bundle branch block)   . Calciphylaxis     a. Multiple admissions for nonhealing L ulcer/sepsis, s/p L AKA 02/02/2014.  . Chronic anemia   . Dementia   . ESRD (end stage renal disease) on dialysis   . Renal insufficiency   . Generalized weakness   . Hypotension     renovascular  . History of left bundle branch block   . Type II diabetes mellitus     not taking any medications.    History  Substance Use Topics  . Smoking status: Former Smoker -- 0.25 packs/day for 7 years    Types: Cigarettes    Quit date: 06/12/1976  . Smokeless tobacco: Never Used  . Alcohol Use: No    Family History  Problem Relation Age of Onset  . Cancer Sister     breast  . Cancer Brother     lung  . Anesthesia problems Neg Hx   . Hypotension Neg Hx   . Malignant hyperthermia Neg Hx   . Pseudochol deficiency Neg Hx     Allergies  Allergen Reactions  . Benazepril Hcl Hives, Itching and Rash    flareups on patient head      Current outpatient prescriptions:  .  acetaminophen (TYLENOL) 325 MG tablet, Take 2 tablets (650 mg total) by mouth every 6 (six) hours as needed for mild pain, fever or headache., Disp: , Rfl:  .  albuterol (PROVENTIL) (2.5 MG/3ML) 0.083% nebulizer solution, Take 3 mLs (2.5 mg total) by nebulization every 6 (six) hours as needed for wheezing or shortness of breath., Disp: 75 mL, Rfl: 12 .  Amino Acids-Protein Hydrolys (FEEDING SUPPLEMENT, PRO-STAT SUGAR FREE 64,) LIQD, Take 30 mLs by mouth 2 (two) times daily between meals., Disp: 900 mL, Rfl: 0 .  aspirin EC 81 MG tablet, Take 81 mg by mouth every morning. , Disp: , Rfl:  .  atorvastatin (LIPITOR) 20 MG tablet, Take 20 mg by mouth at bedtime., Disp: , Rfl:  .  b-complex w/C (TOTAL B/C) TABS tablet, Take 1 tablet by mouth at bedtime., Disp: , Rfl:  .  collagenase (SANTYL) ointment, Apply 1 application topically daily. Apply to right lower leg wound, Disp: , Rfl:  .  gabapentin (NEURONTIN) 100 MG capsule, Take 100 mg by mouth at bedtime. , Disp: ,   Rfl:  .  HYDROcodone-acetaminophen (NORCO/VICODIN) 5-325 MG per tablet, Take 1 tablet by mouth every 6 (six) hours as needed for moderate pain., Disp: 120 tablet, Rfl: 0 .  midodrine (PROAMATINE) 10 MG tablet, Take 1 tablet (10 mg total) by mouth 3 (three) times daily with meals., Disp: 90 tablet, Rfl: 0 .  neomycin-bacitracin-polymyxin (NEOSPORIN) ointment, Apply 1 application topically daily. Apply to right lower leg wound, Disp: , Rfl:  .  peg 3350 powder (MOVIPREP) 100 G SOLR, Take 1 kit by mouth once., Disp: , Rfl:  .  sodium hypochlorite (DAKIN'S 1/2 STRENGTH) external solution, Apply 1 application topically daily. Apply to right lower leg wound, Disp: , Rfl:  .  feeding supplement, RESOURCE BREEZE, (RESOURCE BREEZE) LIQD, Take 1 Container by mouth 2 (two) times daily between meals. (Patient not taking: Reported on 07/21/2014), Disp: , Rfl: 0  BP 120/70 mmHg  Pulse 85  Resp 16  Ht 5' 5"  (1.651 m)  Wt 155 lb (70.308 kg)  BMI 25.79 kg/m2  Body mass index is 25.79 kg/(m^2).          Review of Systems Denies active chest pain, dyspnea on exertion, orthopnea, hemoptysis.     Objective:   Physical Exam BP 120/70 mmHg  Pulse 85  Resp 16  Ht 5' 5" (1.651 m)  Wt 155 lb (70.308 kg)  BMI 25.79 kg/m2   general patient in a wheelchair alert and oriented 3  lungs no rhonchi or wheezing   Left upper extremity with functioning AV graft in the forearm. Good pulse and palpable thrill. Left hand well perfused.  Today vein mapping of the upper extremities was performed. There has been a failed right upper arm brachial cephalic fistula and there is no satisfactory vein remaining in the right arm. Left arm has a functioning graft in the forearm and the basilic vein in the upper arm appears patent.     Assessment:      patient arrived with history of recent thrombosis of left forearm graft. No record of procedure accompanied the patient and there was no disc 2 view to see what potential revisable abnormalities occurred. I called Dr. James [name] to discuss this with him and to emphasize that we need a report and a disc to thoroughly evaluate the patient like Sean [name].   apparently the report stated that the patient is still amenable to percutaneous intervention     Plan:     . After discussing situation with Dr. Lin, recommend patient continue to have AV graft utilized until it thrombosis or until there is an area that needs surgical revision and if so then we should evaluate patient by viewing the disc. Dr. Lin agreed with this plan and patient were returned to dialysis on a regularly scheduled time       

## 2014-07-29 LAB — CBC AND DIFFERENTIAL
HCT: 41 % (ref 41–53)
Hemoglobin: 12.4 g/dL — AB (ref 13.5–17.5)

## 2014-08-04 ENCOUNTER — Non-Acute Institutional Stay (SKILLED_NURSING_FACILITY): Payer: Medicare Other | Admitting: Registered Nurse

## 2014-08-04 ENCOUNTER — Encounter: Payer: Self-pay | Admitting: Registered Nurse

## 2014-08-04 DIAGNOSIS — Z992 Dependence on renal dialysis: Secondary | ICD-10-CM | POA: Diagnosis not present

## 2014-08-04 DIAGNOSIS — N186 End stage renal disease: Secondary | ICD-10-CM

## 2014-08-04 DIAGNOSIS — Z89612 Acquired absence of left leg above knee: Secondary | ICD-10-CM

## 2014-08-04 DIAGNOSIS — L97919 Non-pressure chronic ulcer of unspecified part of right lower leg with unspecified severity: Secondary | ICD-10-CM

## 2014-08-04 DIAGNOSIS — L89610 Pressure ulcer of right heel, unstageable: Secondary | ICD-10-CM | POA: Diagnosis not present

## 2014-08-04 DIAGNOSIS — I959 Hypotension, unspecified: Secondary | ICD-10-CM | POA: Diagnosis not present

## 2014-08-04 DIAGNOSIS — E785 Hyperlipidemia, unspecified: Secondary | ICD-10-CM | POA: Diagnosis not present

## 2014-08-04 DIAGNOSIS — G629 Polyneuropathy, unspecified: Secondary | ICD-10-CM

## 2014-08-04 NOTE — Progress Notes (Signed)
Patient ID: Sean [name], male   DOB: Dec 03, 1935, 79 y.o.   MRN: [id number]   Place of Service: [facility]  Allergies  Allergen Reactions  . Benazepril Hcl Hives, Itching and Rash    flareups on patient head    Code Status: Full Code  Goals of Care: Longevity/STR?  Chief Complaint  Patient presents with  . Medical Management of Chronic Issues    ESRD, calciphylaxis, HLD, s/p left AKA, PU of right heel    HPI 79 y.o. male with PMH of DM2, ESRD on HD, caciphylaxis, s/p Left AKA, hypotension, HLD, anemia, DM2, depression among others is being seen for a routine visit. Has 5 lbs weight loss since last routine visit. No recent falls reported. No change in behaviors or functional status reported. Right heel unstageable ulcer is stable-measure the same. RLE calciphylaxis ucler improving-measuring smaller. Hypotension is stable on midodrine. HLD is stable on statin. Continues to get HD for ESRD three times weekly. Neuropathy stable on gabapentin. Seen in room today. Patient would like to know if he could get prosthetic for his left stump. Otherwise, denies any concern.   Review of Systems Constitutional: Negative for fever, chills, and fatigue. HENT: Negative for ear pain, congestion, and sore throat Eyes: Negative for eye pain, eye discharge, and visual disturbance  Cardiovascular: Negative for chest pain, palpitations. Positive for swelling of RLE Respiratory: Negative cough, shortness of breath, and wheezing.  Gastrointestinal: Negative for nausea and vomiting. Negative for abdominal pain, diarrhea and constipation.  Musculoskeletal: Negative for back pain and joint pain. Neurological: Negative for dizziness and headache. Positive for numbness and tingling of hands/fingers Skin: Negative for rash. Positive for wounds of RLE Psychiatric: Negative for depression.   Past Medical History  Diagnosis Date  . Hyperparathyroidism   . Arthritis   . Hyperlipidemia   .  Hypertension   . Neuromuscular disorder   . Stroke   . GERD (gastroesophageal reflux disease)   . Migraine   . Heart attack 2011; 1990's  . Peripheral neuropathy   . Anxiety   . Depression   . Dialysis patient     M, W, F; [place] CAD (coronary artery disease)     a. s/p NSTEMI and failed RCA PCI in 2011. b. Nuc 12/2013: nonischemic, prior inferior MI.  Marland Kitchen LBBB (left bundle branch block)   . Calciphylaxis     a. Multiple admissions for nonhealing L ulcer/sepsis, s/p L AKA 02/02/2014.  Marland Kitchen Chronic anemia   . Dementia   . ESRD (end stage renal disease) on dialysis   . Renal insufficiency   . Generalized weakness   . Hypotension     renovascular  . History of left bundle branch block   . Type II diabetes mellitus     not taking any medications.    Past Surgical History  Procedure Laterality Date  . Parathyroidectomy  2011  . Capd insertion  09/12/2011    Procedure: LAPAROSCOPIC INSERTION CONTINUOUS AMBULATORY PERITONEAL DIALYSIS  (CAPD) CATHETER;  Surgeon: Edward Jolly, MD;  Location: [place];  Service: General;  Laterality: N/A;  . Sp av dialysis shunt access existing *l*  2003  . Dg av dialysis shunt intro ndl*r* or      "not working" (09/19/11)  . Capd removal  03/12/2012    Procedure: CONTINUOUS AMBULATORY PERITONEAL DIALYSIS  (CAPD) CATHETER REMOVAL;  Surgeon: Edward Jolly, MD;  Location: [place];  Service: General;  Laterality: N/A;  . Amputation  Left 02/02/2014    Procedure: AMPUTATION ABOVE KNEE-LEFT;  Surgeon: Elam Dutch, MD;  Location: [place];  Service: Vascular;  Laterality: Left;  . I&d extremity Left 03/31/2014    Procedure: IRRIGATION AND DEBRIDEMENT EXTREMITY - LEFT ABOVE KNEE AMPUTATION;  Surgeon: Elam Dutch, MD;  Location: [place];  Service: Vascular;  Laterality: Left;  . Application of wound vac Left 03/31/2014    Procedure: APPLICATION OF WOUND VAC - LEFT AKA;  Surgeon: Elam Dutch, MD;  Location: [place];  Service: Vascular;   Laterality: Left;  . Colon surgery  2011    colon resection   . Colonoscopy with propofol N/A 07/14/2014    Procedure: COLONOSCOPY WITH PROPOFOL;  Surgeon: Jerene Bears, MD;  Location: [facility];  Service: Gastroenterology;  Laterality: N/A;    History   Social History  . Marital Status: Married    Spouse Name: N/A  . Number of Children: N/A  . Years of Education: N/A   Occupational History  . Not on file.   Social History Main Topics  . Smoking status: Former Smoker -- 0.25 packs/day for 7 years    Types: Cigarettes    Quit date: 06/12/1976  . Smokeless tobacco: Never Used  . Alcohol Use: No  . Drug Use: No  . Sexual Activity: No   Other Topics Concern  . Not on file   Social History Narrative    Family History  Problem Relation Age of Onset  . Cancer Sister     breast  . Cancer Brother     lung  . Anesthesia problems Neg Hx   . Hypotension Neg Hx   . Malignant hyperthermia Neg Hx   . Pseudochol deficiency Neg Hx       Medication List       This list is accurate as of: 08/04/14  8:22 PM.  Always use your most recent med list.               acetaminophen 325 MG tablet  Commonly known as:  TYLENOL  Take 2 tablets (650 mg total) by mouth every 6 (six) hours as needed for mild pain, fever or headache.     albuterol (2.5 MG/3ML) 0.083% nebulizer solution  Commonly known as:  PROVENTIL  Take 3 mLs (2.5 mg total) by nebulization every 6 (six) hours as needed for wheezing or shortness of breath.     aspirin EC 81 MG tablet  Take 81 mg by mouth every morning.     atorvastatin 20 MG tablet  Commonly known as:  LIPITOR  Take 20 mg by mouth at bedtime.     b-complex w/C Tabs tablet  Take 1 tablet by mouth at bedtime.     collagenase ointment  Commonly known as:  SANTYL  Apply 1 application topically daily. Apply to right lower leg wound     feeding supplement (PRO-STAT SUGAR FREE 64) Liqd  Take 30 mLs by mouth 2 (two) times daily between meals.      gabapentin 100 MG capsule  Commonly known as:  NEURONTIN  Take 100 mg by mouth at bedtime.     HYDROcodone-acetaminophen 5-325 MG per tablet  Commonly known as:  NORCO/VICODIN  Take 1 tablet by mouth every 6 (six) hours as needed for moderate pain.     midodrine 10 MG tablet  Commonly known as:  PROAMATINE  Take 1 tablet (10 mg total) by mouth 3 (three) times daily with meals.  Physical Exam  BP 124/74 mmHg  Pulse 72  Temp(Src) 96.7 F (35.9 C)  Resp 20  Ht 5\' 5"  (1.651 m)  Wt 156 lb 12.8 oz (71.124 kg)  BMI 26.09 kg/m2  SpO2 95%  Constitutional: WDWN elderly male in no acute distress. Conversant and pleasant  HEENT: Normocephalic and atraumatic. PERRL. EOM intact. No icterus. Oral mucosa moist. Posterior pharynx clear of any exudate or lesions.  Neck: Supple and nontender. No lymphadenopathy, masses, or thyromegaly. No JVD or carotid bruits.  Cardiac: Normal S1, S2. RRR without appreciable murmurs, rubs, or gallops. Right distal pulse diminished with 1+ pedal edema.  Left AVF with positive bruit and thrill.  Lungs: No respiratory distress. Breath sounds clear bilaterally without rales, rhonchi, or wheezes.  Abdomen: Audible bowel sounds in all quadrants. Soft, nontender, nondistended.  Musculoskeletal: able to move extremities. No joint erythema or tenderness.  Skin: Warm and dry. No rash noted. No erythema. S/p Left AKA. Right LE calciphylaxis ulcer appear smaller with irregular shape; ~50% granulation tissue and 50~ with yellow slough.  Unstageable pressure ulcer to right heel stable, measuring the same 0.2x0.2x0.2cm with 100% covered in semi-hard pale crusty yellow slough.  Neurological: Alert and oriented to person, place, and time.  Psychiatric: Judgment and insight adequate. Appropriate mood and affect.   Labs Reviewed  CBC Latest Ref Rng 07/29/2014 05/26/2014 05/23/2014  WBC 4.0 - 10.5 K/uL - 5.1 4.8  Hemoglobin 13.5 - 17.5 g/dL 12.4(A) 9.5(L) 8.8(A)    Hematocrit 41 - 53 % 41 30.8(L) 29(A)  Platelets 150.0 - 400.0 K/uL - 330.0 288    CMP Latest Ref Rng 05/23/2014 05/08/2014 05/07/2014  Glucose 70 - 99 mg/dL - 86 66(L)  BUN 4 - 21 mg/dL 26(A) 39(H) 28(H)  Creatinine 0.6 - 1.3 mg/dL 3.7(A) 5.94(H) 4.87(H)  Sodium 137 - 147 mmol/L 135(A) 139 137  Potassium 3.4 - 5.3 mmol/L 3.8 4.0 4.4  Chloride 96 - 112 mEq/L - 98 98  CO2 19 - 32 mEq/L - 25 21  Calcium 8.4 - 10.5 mg/dL - 8.4 9.0  Total Protein 6.0 - 8.3 g/dL - - -  Total Bilirubin 0.3 - 1.2 mg/dL - - -  Alkaline Phos 39 - 117 U/L - - -  AST 0 - 37 U/L - - -  ALT 0 - 53 U/L - - -    Lab Results  Component Value Date   HGBA1C 5.9* 02/06/2014    Lab Results  Component Value Date   TSH 1.050 12/21/2013    Lipid Panel     Component Value Date/Time   CHOL 157 04/30/2014   TRIG 82 04/30/2014   HDL 39 04/30/2014   CHOLHDL 4.7 09/20/2011 0822   VLDL 24 09/20/2011 0822   LDLCALC 102 04/30/2014    Assessment & Plan 1. End stage renal disease on dialysis No issues. Continue HD on MOn, Wed, and Fri, 1200cc fluid restriction, and renal diet. Continue to f/u with nephrology for renal concerns  2. Calciphylaxis of right lower extremity with nonhealing ulcer, with unspecified severity Improving. Continue wound care with 1/4 strength Dakins solution, polysporin ointment, santyl, calcium alginate, and dry dressing. Continue to monitor his status.   3. Dyslipidemia Continue lipitor 20mg  daily   4. Peripheral neuropathy No issues. Continue gabapentin 100mg  daily, tylenol 650mg  every six hours as needed, and norco 5/325mg  every four hours as needed.  5. Hypotension, unspecified hypotension type Stable. Continue midodrine 10mg  three times daily. Continue to monitor BP  6. Unstageable pressure  ulcer of right heel Stable. Continue to clean with NS, apply skin prep, and dry dressing every 3 days. Continue pressure ulcer precautions and monitor. Continue pressure ulcer precautions and  monitor.  7. S/p left aka Complete wound healing. Continue fall precautions. Discussed with PT today about arranging for prosthetic fitting for his left stump.    Family/Staff Communication Plan of care discussed with resident and nursing staff. Resident and nursing staff verbalized understanding and agree with plan of care. No additional questions or concerns reported.    Arthur Holms, MSN, AGNP-C [facility] [address] [place], [place] [address] [phone] [8am-5pm] After hours: [phone]

## 2014-08-05 LAB — BASIC METABOLIC PANEL
POTASSIUM: 4.6 mmol/L (ref 3.4–5.3)
SODIUM: 136 mmol/L — AB (ref 137–147)

## 2014-08-26 LAB — CBC AND DIFFERENTIAL
HCT: 35 % — AB (ref 41–53)
Hemoglobin: 11.1 g/dL — AB (ref 13.5–17.5)

## 2014-09-01 ENCOUNTER — Encounter: Payer: Self-pay | Admitting: Registered Nurse

## 2014-09-01 ENCOUNTER — Non-Acute Institutional Stay (SKILLED_NURSING_FACILITY): Payer: Medicare Other | Admitting: Registered Nurse

## 2014-09-01 DIAGNOSIS — G629 Polyneuropathy, unspecified: Secondary | ICD-10-CM

## 2014-09-01 DIAGNOSIS — L97919 Non-pressure chronic ulcer of unspecified part of right lower leg with unspecified severity: Secondary | ICD-10-CM

## 2014-09-01 DIAGNOSIS — Z89612 Acquired absence of left leg above knee: Secondary | ICD-10-CM

## 2014-09-01 DIAGNOSIS — I959 Hypotension, unspecified: Secondary | ICD-10-CM | POA: Diagnosis not present

## 2014-09-01 DIAGNOSIS — Z992 Dependence on renal dialysis: Secondary | ICD-10-CM

## 2014-09-01 DIAGNOSIS — E785 Hyperlipidemia, unspecified: Secondary | ICD-10-CM

## 2014-09-01 DIAGNOSIS — L89610 Pressure ulcer of right heel, unstageable: Secondary | ICD-10-CM

## 2014-09-01 DIAGNOSIS — N186 End stage renal disease: Secondary | ICD-10-CM | POA: Diagnosis not present

## 2014-09-01 NOTE — Progress Notes (Signed)
Patient ID: Sean [name], male   DOB: 1935-12-02, 79 y.o.   MRN: [id number]   Place of Service: [facility]  Allergies  Allergen Reactions  . Benazepril Hcl Hives, Itching and Rash    flareups on patient head    Code Status: Full Code  Goals of Care: Longevity/STR?  Chief Complaint  Patient presents with  . Medical Management of Chronic Issues    ESRD, calciphylaxis, hypotension, s/p aka, PU of Right heel, hyptension, neuropathy    HPI 79 y.o. male with PMH of DM2, ESRD on HD, caciphylaxis, s/p Left AKA, hypotension, HLD, anemia, DM2, depression among others is being seen for a routine visit. Weight stable over the past 30 days No recent falls reported. No change in behaviors or functional status reported. Right heel unstageable ulcer is stable-measure the same 0.2x0.2x0.2cm with 10% pink granulation. RLE calciphylaxis ucler improves, measuring slightly smaller. Hypotension is stable on midodrine. ESRD stable on HD three times weekly. Neuropathy stable on gabapentin. Seen in room today. Denies any concerns. Reported that he is working with [employer] to get prosthetic for his left aka stump.   Review of Systems Constitutional: Negative for fever, chills, and fatigue. HENT: Negative for ear pain, congestion, and sore throat Eyes: Negative for eye pain, eye discharge, and visual disturbance  Cardiovascular: Negative for chest pain, palpitations. Positive for swelling of RLE Respiratory: Negative cough, shortness of breath, and wheezing.  Gastrointestinal: Negative for nausea and vomiting. Negative for abdominal pain, diarrhea and constipation.  Musculoskeletal: Negative for back pain and joint pain. Neurological: Negative for dizziness and headache. Positive for numbness and tingling of hands/fingers Skin: Negative for rash. Positive for wounds of RLE Psychiatric: Negative for depression.   Past Medical History  Diagnosis Date  . Hyperparathyroidism   . Arthritis   .  Hyperlipidemia   . Hypertension   . Neuromuscular disorder   . Stroke   . GERD (gastroesophageal reflux disease)   . Migraine   . Heart attack 2011; 1990's  . Peripheral neuropathy   . Anxiety   . Depression   . Dialysis patient     M, W, F; [place] CAD (coronary artery disease)     a. s/p NSTEMI and failed RCA PCI in 2011. b. Nuc 12/2013: nonischemic, prior inferior MI.  Marland Kitchen LBBB (left bundle branch block)   . Calciphylaxis     a. Multiple admissions for nonhealing L ulcer/sepsis, s/p L AKA 02/02/2014.  Marland Kitchen Chronic anemia   . Dementia   . ESRD (end stage renal disease) on dialysis   . Renal insufficiency   . Generalized weakness   . Hypotension     renovascular  . History of left bundle branch block   . Type II diabetes mellitus     not taking any medications.    Past Surgical History  Procedure Laterality Date  . Parathyroidectomy  2011  . Capd insertion  09/12/2011    Procedure: LAPAROSCOPIC INSERTION CONTINUOUS AMBULATORY PERITONEAL DIALYSIS  (CAPD) CATHETER;  Surgeon: Edward Jolly, MD;  Location: [place];  Service: General;  Laterality: N/A;  . Sp av dialysis shunt access existing *l*  2003  . Dg av dialysis shunt intro ndl*r* or      "not working" (09/19/11)  . Capd removal  03/12/2012    Procedure: CONTINUOUS AMBULATORY PERITONEAL DIALYSIS  (CAPD) CATHETER REMOVAL;  Surgeon: Edward Jolly, MD;  Location: [place];  Service: General;  Laterality: N/A;  . Amputation Left  02/02/2014    Procedure: AMPUTATION ABOVE KNEE-LEFT;  Surgeon: Elam Dutch, MD;  Location: [place];  Service: Vascular;  Laterality: Left;  . I&d extremity Left 03/31/2014    Procedure: IRRIGATION AND DEBRIDEMENT EXTREMITY - LEFT ABOVE KNEE AMPUTATION;  Surgeon: Elam Dutch, MD;  Location: [place];  Service: Vascular;  Laterality: Left;  . Application of wound vac Left 03/31/2014    Procedure: APPLICATION OF WOUND VAC - LEFT AKA;  Surgeon: Elam Dutch, MD;  Location: [place];   Service: Vascular;  Laterality: Left;  . Colon surgery  2011    colon resection   . Colonoscopy with propofol N/A 07/14/2014    Procedure: COLONOSCOPY WITH PROPOFOL;  Surgeon: Jerene Bears, MD;  [facility]: [facility];  Service: Gastroenterology;  Laterality: N/A;    History   Social History  . Marital Status: Married    Spouse Name: N/A  . Number of Children: N/A  . Years of Education: N/A   Occupational History  . Not on file.   Social History Main Topics  . Smoking status: Former Smoker -- 0.25 packs/day for 7 years    Types: Cigarettes    Quit date: 06/12/1976  . Smokeless tobacco: Never Used  . Alcohol Use: No  . Drug Use: No  . Sexual Activity: No   Other Topics Concern  . Not on file   Social History Narrative    Family History  Problem Relation Age of Onset  . Cancer Sister     breast  . Cancer Brother     lung  . Anesthesia problems Neg Hx   . Hypotension Neg Hx   . Malignant hyperthermia Neg Hx   . Pseudochol deficiency Neg Hx       Medication List       This list is accurate as of: 09/01/14  3:19 PM.  Always use your most recent med list.               acetaminophen 325 MG tablet  Commonly known as:  TYLENOL  Take 2 tablets (650 mg total) by mouth every 6 (six) hours as needed for mild pain, fever or headache.     albuterol (2.5 MG/3ML) 0.083% nebulizer solution  Commonly known as:  PROVENTIL  Take 3 mLs (2.5 mg total) by nebulization every 6 (six) hours as needed for wheezing or shortness of breath.     aspirin EC 81 MG tablet  Take 81 mg by mouth every morning.     atorvastatin 20 MG tablet  Commonly known as:  LIPITOR  Take 20 mg by mouth at bedtime.     b-complex w/C Tabs tablet  Take 1 tablet by mouth at bedtime.     collagenase ointment  Commonly known as:  SANTYL  Apply 1 application topically daily. Apply to right lower leg wound     feeding supplement (PRO-STAT SUGAR FREE 64) Liqd  Take 30 mLs by mouth 2 (two) times daily  between meals.     gabapentin 100 MG capsule  Commonly known as:  NEURONTIN  Take 100 mg by mouth at bedtime.     HYDROcodone-acetaminophen 5-325 MG per tablet  Commonly known as:  NORCO/VICODIN  Take 1 tablet by mouth every 6 (six) hours as needed for moderate pain.     midodrine 10 MG tablet  Commonly known as:  PROAMATINE  Take 1 tablet (10 mg total) by mouth 3 (three) times daily with meals.  Physical Exam  BP 134/73 mmHg  Pulse 84  Temp(Src) 98.4 F (36.9 C)  Resp 20  Ht 5\' 5"  (1.651 m)  Wt 155 lb 9.6 oz (70.58 kg)  BMI 25.89 kg/m2  SpO2 99%  Constitutional: WDWN elderly male in no acute distress. Conversant and pleasant  HEENT: Normocephalic and atraumatic. PERRL. EOM intact. No icterus. Oral mucosa moist. Posterior pharynx clear of any exudate or lesions.  Neck: Supple and nontender. No lymphadenopathy, masses, or thyromegaly. No JVD or carotid bruits.  Cardiac: Normal S1, S2. RRR without appreciable murmurs, rubs, or gallops. Right distal pulse diminished with trace to 1+ pitting pedal edema.  Left AVF with positive bruit and thrill.  Lungs: No respiratory distress. Breath sounds clear bilaterally without rales, rhonchi, or wheezes.  Abdomen: Audible bowel sounds in all quadrants. Soft, nontender, nondistended.  Musculoskeletal: able to move extremities. No joint erythema or tenderness.  Skin: Warm and dry. No rash noted. No erythema. S/p Left AKA. Unstageable pressure ulcer to right heel stable, measuring the same 0.2x0.2x0.2cm with 10% pink granulation and 90% slough. Right LE calciphylaxis ulcer with irregular shape, appear smaller with ~55% granulation tissue and 45~ with yellow slough. Neurological: Alert and oriented to person, place, and time.  Psychiatric: Judgment and insight adequate. Appropriate mood and affect.   Labs Reviewed  CBC Latest Ref Rng 08/26/2014 07/29/2014 05/26/2014  WBC 4.0 - 10.5 K/uL - - 5.1  Hemoglobin 13.5 - 17.5 g/dL 11.1(A)  12.4(A) 9.5(L)  Hematocrit 41 - 53 % 35(A) 41 30.8(L)  Platelets 150.0 - 400.0 K/uL - - 330.0    CMP Latest Ref Rng 08/05/2014 05/23/2014 05/08/2014  Glucose 70 - 99 mg/dL - - 86  BUN 4 - 21 mg/dL - 26(A) 39(H)  Creatinine 0.6 - 1.3 mg/dL - 3.7(A) 5.94(H)  Sodium 137 - 147 mmol/L 136(A) 135(A) 139  Potassium 3.4 - 5.3 mmol/L 4.6 3.8 4.0  Chloride 96 - 112 mEq/L - - 98  CO2 19 - 32 mEq/L - - 25  Calcium 8.4 - 10.5 mg/dL - - 8.4  Total Protein 6.0 - 8.3 g/dL - - -  Total Bilirubin 0.3 - 1.2 mg/dL - - -  Alkaline Phos 39 - 117 U/L - - -  AST 0 - 37 U/L - - -  ALT 0 - 53 U/L - - -    Lab Results  Component Value Date   HGBA1C 5.9* 02/06/2014    Lab Results  Component Value Date   TSH 1.050 12/21/2013    Lipid Panel     Component Value Date/Time   CHOL 157 04/30/2014   TRIG 82 04/30/2014   HDL 39 04/30/2014   CHOLHDL 4.7 09/20/2011 0822   VLDL 24 09/20/2011 0822   LDLCALC 102 04/30/2014    Assessment & Plan 1. End stage renal disease on dialysis Stable. Continue HD on Mon, Wed, and Fri, 1200cc fluid restriction, and renal diet. Continue to f/u with nephrology for renal concerns  2. Calciphylaxis of right lower extremity with nonhealing ulcer, with unspecified severity Improves. Continue wound care with 1/4 strength Dakins solution, santyl, calcium alginate, and dry dressing. Continue tylenol 650mg  every six hours as needed, and norco 5/325mg  every four hours as needed for pain. Continue to monitor his status.   3. Dyslipidemia Last LDL 102 on 04/2015. Continue lipitor 20mg  daily   4. Peripheral neuropathy Stable. Continue gabapentin 100mg  daily and monitor  5. Hypotension, unspecified hypotension type Stable. Continue midodrine 10mg  three times daily. Continue to monitor  BP and his status  6. Unstageable pressure ulcer of right heel Stable. Will change wound care to NS, santyl, collagen, and dry dressing daily. Continue pressure ulcer precautions and  monitor.  7. S/p left aka Complete wound healing. Continue to f/u with [employer] for prosthetic. Fall risk precautions   Family/Staff Communication Plan of care discussed with resident and nursing staff. Resident and nursing staff verbalized understanding and agree with plan of care. No additional questions or concerns reported.    Arthur Holms, MSN, AGNP-C [facility] [address] [place],  [address] [phone] [8am-5pm] After hours: [phone]

## 2014-09-30 ENCOUNTER — Non-Acute Institutional Stay (SKILLED_NURSING_FACILITY): Payer: Medicare Other | Admitting: Registered Nurse

## 2014-09-30 DIAGNOSIS — E785 Hyperlipidemia, unspecified: Secondary | ICD-10-CM

## 2014-09-30 DIAGNOSIS — G609 Hereditary and idiopathic neuropathy, unspecified: Secondary | ICD-10-CM | POA: Diagnosis not present

## 2014-09-30 DIAGNOSIS — I959 Hypotension, unspecified: Secondary | ICD-10-CM

## 2014-09-30 DIAGNOSIS — L89610 Pressure ulcer of right heel, unstageable: Secondary | ICD-10-CM | POA: Diagnosis not present

## 2014-09-30 DIAGNOSIS — N186 End stage renal disease: Secondary | ICD-10-CM

## 2014-09-30 DIAGNOSIS — Z992 Dependence on renal dialysis: Secondary | ICD-10-CM

## 2014-09-30 DIAGNOSIS — L97919 Non-pressure chronic ulcer of unspecified part of right lower leg with unspecified severity: Secondary | ICD-10-CM | POA: Diagnosis not present

## 2014-09-30 DIAGNOSIS — Z89612 Acquired absence of left leg above knee: Secondary | ICD-10-CM | POA: Diagnosis not present

## 2014-09-30 DIAGNOSIS — M792 Neuralgia and neuritis, unspecified: Secondary | ICD-10-CM

## 2014-09-30 NOTE — Progress Notes (Signed)
Patient ID: Sean [name], male   DOB: December 04, 1935, 79 y.o.   MRN: [id number]   Place of Service: [facility]  Allergies  Allergen Reactions  . Benazepril Hcl Hives, Itching and Rash    flareups on patient head    Code Status: Full Code  Goals of Care: Longevity/STR?  Chief Complaint  Patient presents with  . Medical Management of Chronic Issues    ESRD on HD, hypotension, calciphylaxis, unstageable PU of Right heel, s/p Left AKA, HLD, neuropathy     HPI 79 y.o. male with PMH of DM2, ESRD on HD, caciphylaxis, s/p Left AKA, hypotension, HLD, anemia, DM2, depression among others is being seen for a routine visit. Weight stable over the past 30 days. No recent falls reported. No change in behaviors or functional status reported. Right heel unstageable ulcer has resolved. RLE calciphylaxis ucler improves, measuring 3.2x4x0cm. Hypotension is stable on midodrine with BP range mostly in 130-140s/70s. ESRD stable on HD three times weekly. Neuropathy stable on gabapentin. Seen in room today. Denies any concerns. Reported that he is working with therapy since he got his prosthetic. His goal is to go home asap.   Review of Systems Constitutional: Negative for fever, chills, and fatigue. HENT: Negative for ear pain, congestion, and sore throat Eyes: Negative for eye pain, eye discharge, and visual disturbance  Cardiovascular: Negative for chest pain, palpitations.  Respiratory: Negative cough, shortness of breath, and wheezing.  Gastrointestinal: Negative for nausea and vomiting. Negative for abdominal pain, diarrhea and constipation.  Musculoskeletal: Negative for back pain and joint pain. Neurological: Negative for dizziness and headache. Positive for numbness and tingling of hands/fingers Skin: Negative for rash. Positive for wounds of RLE Psychiatric: Negative for depression.   Past Medical History  Diagnosis Date  . Hyperparathyroidism   . Arthritis   . Hyperlipidemia     . Hypertension   . Neuromuscular disorder   . Stroke   . GERD (gastroesophageal reflux disease)   . Migraine   . Heart attack 2011; 1990's  . Peripheral neuropathy   . Anxiety   . Depression   . Dialysis patient     M, W, F; [place] CAD (coronary artery disease)     a. s/p NSTEMI and failed RCA PCI in 2011. b. Nuc 12/2013: nonischemic, prior inferior MI.  Marland Kitchen LBBB (left bundle branch block)   . Calciphylaxis     a. Multiple admissions for nonhealing L ulcer/sepsis, s/p L AKA 02/02/2014.  Marland Kitchen Chronic anemia   . Dementia   . ESRD (end stage renal disease) on dialysis   . Renal insufficiency   . Generalized weakness   . Hypotension     renovascular  . History of left bundle branch block   . Type II diabetes mellitus     not taking any medications.    Past Surgical History  Procedure Laterality Date  . Parathyroidectomy  2011  . Capd insertion  09/12/2011    Procedure: LAPAROSCOPIC INSERTION CONTINUOUS AMBULATORY PERITONEAL DIALYSIS  (CAPD) CATHETER;  Surgeon: Edward Jolly, MD;  Location: [place];  Service: General;  Laterality: N/A;  . Sp av dialysis shunt access existing *l*  2003  . Dg av dialysis shunt intro ndl*r* or      "not working" (09/19/11)  . Capd removal  03/12/2012    Procedure: CONTINUOUS AMBULATORY PERITONEAL DIALYSIS  (CAPD) CATHETER REMOVAL;  Surgeon: Edward Jolly, MD;  Location: [place];  Service: General;  Laterality: N/A;  .  Amputation Left 02/02/2014    Procedure: AMPUTATION ABOVE KNEE-LEFT;  Surgeon: Elam Dutch, MD;  Location: [place];  Service: Vascular;  Laterality: Left;  . I&d extremity Left 03/31/2014    Procedure: IRRIGATION AND DEBRIDEMENT EXTREMITY - LEFT ABOVE KNEE AMPUTATION;  Surgeon: Elam Dutch, MD;  Location: [place];  Service: Vascular;  Laterality: Left;  . Application of wound vac Left 03/31/2014    Procedure: APPLICATION OF WOUND VAC - LEFT AKA;  Surgeon: Elam Dutch, MD;  Location: [place];  Service: Vascular;   Laterality: Left;  . Colon surgery  2011    colon resection   . Colonoscopy with propofol N/A 07/14/2014    Procedure: COLONOSCOPY WITH PROPOFOL;  Surgeon: Jerene Bears, MD;  Location: [facility];  Service: Gastroenterology;  Laterality: N/A;    History   Social History  . Marital Status: Married    Spouse Name: N/A  . Number of Children: N/A  . Years of Education: N/A   Occupational History  . Not on file.   Social History Main Topics  . Smoking status: Former Smoker -- 0.25 packs/day for 7 years    Types: Cigarettes    Quit date: 06/12/1976  . Smokeless tobacco: Never Used  . Alcohol Use: No  . Drug Use: No  . Sexual Activity: No   Other Topics Concern  . Not on file   Social History Narrative    Family History  Problem Relation Age of Onset  . Cancer Sister     breast  . Cancer Brother     lung  . Anesthesia problems Neg Hx   . Hypotension Neg Hx   . Malignant hyperthermia Neg Hx   . Pseudochol deficiency Neg Hx       Medication List       This list is accurate as of: 09/30/14 11:59 PM.  Always use your most recent med list.               acetaminophen 325 MG tablet  Commonly known as:  TYLENOL  Take 2 tablets (650 mg total) by mouth every 6 (six) hours as needed for mild pain, fever or headache.     albuterol (2.5 MG/3ML) 0.083% nebulizer solution  Commonly known as:  PROVENTIL  Take 3 mLs (2.5 mg total) by nebulization every 6 (six) hours as needed for wheezing or shortness of breath.     aspirin EC 81 MG tablet  Take 81 mg by mouth every morning.     atorvastatin 20 MG tablet  Commonly known as:  LIPITOR  Take 20 mg by mouth at bedtime.     b-complex w/C Tabs tablet  Take 1 tablet by mouth at bedtime.     collagenase ointment  Commonly known as:  SANTYL  Apply 1 application topically daily. Apply to right lower leg wound     feeding supplement (PRO-STAT SUGAR FREE 64) Liqd  Take 30 mLs by mouth 2 (two) times daily between meals.      gabapentin 100 MG capsule  Commonly known as:  NEURONTIN  Take 100 mg by mouth at bedtime.     HYDROcodone-acetaminophen 5-325 MG per tablet  Commonly known as:  NORCO/VICODIN  Take 1 tablet by mouth every 6 (six) hours as needed for moderate pain.     midodrine 10 MG tablet  Commonly known as:  PROAMATINE  Take 1 tablet (10 mg total) by mouth 3 (three) times daily with meals.  Physical Exam  BP 148/77 mmHg  Pulse 80  Temp(Src) 97 F (36.1 C)  Resp 16  Ht 5\' 5"  (1.651 m)  Wt 155 lb 9.6 oz (70.58 kg)  BMI 25.89 kg/m2  SpO2 95%  Constitutional: WDWN elderly male in no acute distress. Conversant and pleasant  HEENT: Normocephalic and atraumatic. PERRL. EOM intact. No scleral icterus. Oral mucosa moist. Posterior pharynx clear of any exudate or lesions.  Neck: Supple and nontender. No lymphadenopathy, masses, or thyromegaly. No JVD or carotid bruits.  Cardiac: Normal S1, S2. RRR without appreciable murmurs, rubs, or gallops. Right distal pulse diminished with trace to 1+ pitting pedal edema.  Left AVF with positive bruit and thrill.  Lungs: No respiratory distress. Breath sounds clear bilaterally without rales, rhonchi, or wheezes.  Abdomen: Audible bowel sounds in all quadrants. Soft, nontender, nondistended.  Musculoskeletal: able to move  extremities. No joint erythema or tenderness.  Skin: Warm and dry. No rash noted. No erythema. S/p Left AKA. See HPI Neurological: Alert and oriented to person, place, and time.  Psychiatric: Judgment and insight adequate. Appropriate mood and affect.  Labs Reviewed  CBC Latest Ref Rng 08/26/2014 07/29/2014 05/26/2014  WBC 4.0 - 10.5 K/uL - - 5.1  Hemoglobin 13.5 - 17.5 g/dL 11.1(A) 12.4(A) 9.5(L)  Hematocrit 41 - 53 % 35(A) 41 30.8(L)  Platelets 150.0 - 400.0 K/uL - - 330.0    CMP Latest Ref Rng 08/05/2014 05/23/2014 05/08/2014  Glucose 70 - 99 mg/dL - - 86  BUN 4 - 21 mg/dL - 26(A) 39(H)  Creatinine 0.6 - 1.3 mg/dL - 3.7(A)  5.94(H)  Sodium 137 - 147 mmol/L 136(A) 135(A) 139  Potassium 3.4 - 5.3 mmol/L 4.6 3.8 4.0  Chloride 96 - 112 mEq/L - - 98  CO2 19 - 32 mEq/L - - 25  Calcium 8.4 - 10.5 mg/dL - - 8.4  Total Protein 6.0 - 8.3 g/dL - - -  Total Bilirubin 0.3 - 1.2 mg/dL - - -  Alkaline Phos 39 - 117 U/L - - -  AST 0 - 37 U/L - - -  ALT 0 - 53 U/L - - -    Lab Results  Component Value Date   HGBA1C 5.9* 02/06/2014    Lab Results  Component Value Date   TSH 1.050 12/21/2013    Lipid Panel     Component Value Date/Time   CHOL 157 04/30/2014   TRIG 82 04/30/2014   HDL 39 04/30/2014   CHOLHDL 4.7 09/20/2011 0822   VLDL 24 09/20/2011 0822   LDLCALC 102 04/30/2014    Assessment & Plan 1. End stage renal disease on dialysis Stable. Continue HD on Mon, Wed, and Fri, 1200cc fluid restriction, and renal diet. Continue to f/u with nephrology for renal concerns  2. Calciphylaxis of right lower extremity with nonhealing ulcer, with unspecified severity Improves. Continue wound care with bactroban, powdered collagen, calcium alginate, and silicone dressing. Continue tylenol 650mg  every six hours as needed, and norco 5/325mg  every four hours as needed for pain. Continue to monitor his status.   3. Dyslipidemia  LDL 102 on 04/2015. Continue lipitor 20mg  daily   4. Peripheral neuropathy Stable. Continue gabapentin 100mg  daily and monitor  5. Hypotension, unspecified hypotension type Stable. Continue midodrine 10mg  three times daily. Continue to monitor BP and his status  6. Unstageable pressure ulcer of right heel Resolved. Continue pressure ulcer precautions.   7. S/p left aka Surgical incision completely heel. Has prosthetic and is working for therapy. Continue  to work with PT/OT for gait/strength/balance training to restore/maximze function. Fall risk precautions  Family/Staff Communication Plan of care discussed with resident and nursing staff. Resident and nursing staff verbalized  understanding and agree with plan of care. No additional questions or concerns reported.    Arthur Holms, MSN, AGNP-C [facility] [address] [place], [place] [address] [phone] [8am-5pm] After hours: [phone]

## 2014-10-07 LAB — CBC AND DIFFERENTIAL
HCT: 41 % (ref 41–53)
Hemoglobin: 12.9 g/dL — AB (ref 13.5–17.5)
WBC: 4.3 10*3/mL

## 2014-10-07 LAB — BASIC METABOLIC PANEL
GLUCOSE: 143 mg/dL
Potassium: 5.5 mmol/L — AB (ref 3.4–5.3)
Sodium: 137 mmol/L (ref 137–147)

## 2014-10-21 LAB — CBC AND DIFFERENTIAL: HEMOGLOBIN: 12.7 g/dL — AB (ref 13.5–17.5)

## 2014-10-27 ENCOUNTER — Non-Acute Institutional Stay (SKILLED_NURSING_FACILITY): Payer: Medicare Other | Admitting: Registered Nurse

## 2014-10-27 ENCOUNTER — Encounter: Payer: Self-pay | Admitting: Registered Nurse

## 2014-10-27 DIAGNOSIS — R519 Headache, unspecified: Secondary | ICD-10-CM

## 2014-10-27 DIAGNOSIS — R51 Headache: Secondary | ICD-10-CM

## 2014-10-27 DIAGNOSIS — L97919 Non-pressure chronic ulcer of unspecified part of right lower leg with unspecified severity: Secondary | ICD-10-CM | POA: Diagnosis not present

## 2014-10-27 DIAGNOSIS — N186 End stage renal disease: Secondary | ICD-10-CM | POA: Diagnosis not present

## 2014-10-27 DIAGNOSIS — Z89612 Acquired absence of left leg above knee: Secondary | ICD-10-CM

## 2014-10-27 DIAGNOSIS — G629 Polyneuropathy, unspecified: Secondary | ICD-10-CM

## 2014-10-27 DIAGNOSIS — J309 Allergic rhinitis, unspecified: Secondary | ICD-10-CM | POA: Diagnosis not present

## 2014-10-27 DIAGNOSIS — I959 Hypotension, unspecified: Secondary | ICD-10-CM

## 2014-10-27 DIAGNOSIS — Z992 Dependence on renal dialysis: Secondary | ICD-10-CM

## 2014-10-27 DIAGNOSIS — E785 Hyperlipidemia, unspecified: Secondary | ICD-10-CM | POA: Diagnosis not present

## 2014-10-27 NOTE — Progress Notes (Signed)
Patient ID: Sean [name], male   DOB: [birth year], 79 y.o.   MRN: [id number]   Place of Service: [facility]  Allergies  Allergen Reactions  . Benazepril Hcl Hives, Itching and Rash    flareups on patient head    Code Status: Full Code  Goals of Care: Longevity/STR?  Chief Complaint  Patient presents with  . Medical Management of Chronic Issues    ESRD, hypotension, HLD, s/p AKA, calciphylaxis, peripheral neuralgia     HPI 79 y.o. male with PMH of DM2, ESRD on HD, caciphylaxis, s/p Left AKA, hypotension, HLD, anemia, DM2, depression among others is being seen for a routine visit. Has 5lbs weight gain over the past 30 days (?pre dialysis), but overall stable in 150s-160s. No recent falls or new skin concerns reported. No change in behaviors or functional status reported. No concerns from nursing staff. RLE calciphylaxis ucler improves, measuring smaller ~1x2x0.2cm with 100% red granulation tissue. Hypotension is stable on midodrine with BP range mostly in 120-140s/70s. ESRD stable on HD three times weekly. Neuropathy stable on gabapentin. Seen in room today. Reported having dull, achy, pressure intermittent left sided headache for the past 2-3 weeks with blurry vision in left eye. Tylenol provides some relief but effect wears off quickly. Noise makes pain worse. Denies any dizziness, fever, chills, photophobia, foreign body sensation, or eye floaters. Reported was recently seen by eye doctor and got new eyeglasses.   Review of Systems Constitutional: Negative for fever, chills, and fatigue. HENT: Negative for ear pain, congestion, and sore throat Eyes: Legally blind in right eye. See HPI Cardiovascular: Negative for chest pain, palpitations.  Respiratory: Negative cough, shortness of breath, and wheezing.  Gastrointestinal: Negative for nausea and vomiting. Negative for abdominal pain, diarrhea and constipation.  Musculoskeletal: Negative for back pain and joint  pain. Neurological: Negative for dizziness. See HPI. Positive for numbness and tingling of hands/fingers Skin: Negative for rash. Positive for wounds of RLE Psychiatric: Negative for depression.   Past Medical History  Diagnosis Date  . Hyperparathyroidism   . Arthritis   . Hyperlipidemia   . Hypertension   . Neuromuscular disorder   . Stroke   . GERD (gastroesophageal reflux disease)   . Migraine   . Heart attack 2011; 1990's  . Peripheral neuropathy   . Anxiety   . Depression   . Dialysis patient     M, W, F; [place] CAD (coronary artery disease)     a. s/p NSTEMI and failed RCA PCI in 2011. b. Nuc 12/2013: nonischemic, prior inferior MI.  Marland Kitchen LBBB (left bundle branch block)   . Calciphylaxis     a. Multiple admissions for nonhealing L ulcer/sepsis, s/p L AKA 02/02/2014.  Marland Kitchen Chronic anemia   . Dementia   . ESRD (end stage renal disease) on dialysis   . Renal insufficiency   . Generalized weakness   . Hypotension     renovascular  . History of left bundle branch block   . Type II diabetes mellitus     not taking any medications.    Past Surgical History  Procedure Laterality Date  . Parathyroidectomy  2011  . Capd insertion  09/12/2011    Procedure: LAPAROSCOPIC INSERTION CONTINUOUS AMBULATORY PERITONEAL DIALYSIS  (CAPD) CATHETER;  Surgeon: Edward Jolly, MD;  Location: [place];  Service: General;  Laterality: N/A;  . Sp av dialysis shunt access existing *l*  2003  . Dg av dialysis shunt intro ndl*r* or      "  not working" (09/19/11)  . Capd removal  03/12/2012    Procedure: CONTINUOUS AMBULATORY PERITONEAL DIALYSIS  (CAPD) CATHETER REMOVAL;  Surgeon: Edward Jolly, MD;  Location: [place];  Service: General;  Laterality: N/A;  . Amputation Left 02/02/2014    Procedure: AMPUTATION ABOVE KNEE-LEFT;  Surgeon: Elam Dutch, MD;  Location: [place];  Service: Vascular;  Laterality: Left;  . I&d extremity Left 03/31/2014    Procedure: IRRIGATION AND  DEBRIDEMENT EXTREMITY - LEFT ABOVE KNEE AMPUTATION;  Surgeon: Elam Dutch, MD;  Location: [place];  Service: Vascular;  Laterality: Left;  . Application of wound vac Left 03/31/2014    Procedure: APPLICATION OF WOUND VAC - LEFT AKA;  Surgeon: Elam Dutch, MD;  Location: [place];  Service: Vascular;  Laterality: Left;  . Colon surgery  2011    colon resection   . Colonoscopy with propofol N/A 07/14/2014    Procedure: COLONOSCOPY WITH PROPOFOL;  Surgeon: Jerene Bears, MD;  Location: [facility];  Service: Gastroenterology;  Laterality: N/A;    History   Social History  . Marital Status: Married    Spouse Name: N/A  . Number of Children: N/A  . Years of Education: N/A   Occupational History  . Not on file.   Social History Main Topics  . Smoking status: Former Smoker -- 0.25 packs/day for 7 years    Types: Cigarettes    Quit date: 06/12/1976  . Smokeless tobacco: Never Used  . Alcohol Use: No  . Drug Use: No  . Sexual Activity: No   Other Topics Concern  . Not on file   Social History Narrative    Family History  Problem Relation Age of Onset  . Cancer Sister     breast  . Cancer Brother     lung  . Anesthesia problems Neg Hx   . Hypotension Neg Hx   . Malignant hyperthermia Neg Hx   . Pseudochol deficiency Neg Hx       Medication List       This list is accurate as of: 10/27/14  4:29 PM.  Always use your most recent med list.               acetaminophen 325 MG tablet  Commonly known as:  TYLENOL  Take 2 tablets (650 mg total) by mouth every 6 (six) hours as needed for mild pain, fever or headache.     albuterol (2.5 MG/3ML) 0.083% nebulizer solution  Commonly known as:  PROVENTIL  Take 3 mLs (2.5 mg total) by nebulization every 6 (six) hours as needed for wheezing or shortness of breath.     aspirin EC 81 MG tablet  Take 81 mg by mouth every morning.     atorvastatin 20 MG tablet  Commonly known as:  LIPITOR  Take 20 mg by mouth at bedtime.      b-complex w/C Tabs tablet  Take 1 tablet by mouth at bedtime.     collagenase ointment  Commonly known as:  SANTYL  Apply 1 application topically daily. Apply to right lower leg wound     feeding supplement (PRO-STAT SUGAR FREE 64) Liqd  Take 30 mLs by mouth 2 (two) times daily between meals.     gabapentin 100 MG capsule  Commonly known as:  NEURONTIN  Take 100 mg by mouth at bedtime.     HYDROcodone-acetaminophen 5-325 MG per tablet  Commonly known as:  NORCO/VICODIN  Take 1 tablet by mouth every 6 (  six) hours as needed for moderate pain.     midodrine 10 MG tablet  Commonly known as:  PROAMATINE  Take 1 tablet (10 mg total) by mouth 3 (three) times daily with meals.     sevelamer carbonate 800 MG tablet  Commonly known as:  RENVELA  Take 1,600 mg by mouth 3 (three) times daily with meals.        Physical Exam  BP 127/71 mmHg  Pulse 83  Temp(Src) 97.8 F (36.6 C)  Resp 17  Ht 5\' 5"  (1.651 m)  Wt 160 lb 3.2 oz (72.666 kg)  BMI 26.66 kg/m2  Constitutional: WDWN elderly male in no acute distress. Conversant and pleasant  HEENT: Normocephalic and atraumatic. Right pupil nonreactive to light. Left pupil round reactive to light. EOM intact. No scleral icterus. Oral mucosa moist. Posterior pharynx clear of any exudate or lesions. Left frontal and maxillary sinuses tender to palpation Neck: Supple and nontender. Bilateral submandibular lymphadenopathy-nontender to palpation. No masses or thyromegaly. No JVD or carotid bruits.  Cardiac: Normal S1, S2. RRR without appreciable murmurs, rubs, or gallops. Right distal pulse diminished with trace to 1+ pitting pedal edema.  Left AVF with positive bruit and thrill.  Lungs: No respiratory distress. Breath sounds clear bilaterally without rales, rhonchi, or wheezes.  Abdomen: Audible bowel sounds in all quadrants. Soft, nontender, nondistended.  Musculoskeletal: able to move  extremities. No joint erythema or tenderness.  Skin:  Warm and dry. No rash noted. No erythema. S/p Left AKA. See HPI Neurological: Alert and oriented to person, place, and time.  Psychiatric: Judgment and insight adequate. Appropriate mood and affect.  Labs Reviewed  CBC Latest Ref Rng 08/26/2014 07/29/2014 05/26/2014  WBC 4.0 - 10.5 K/uL - - 5.1  Hemoglobin 13.5 - 17.5 g/dL 11.1(A) 12.4(A) 9.5(L)  Hematocrit 41 - 53 % 35(A) 41 30.8(L)  Platelets 150.0 - 400.0 K/uL - - 330.0    CMP Latest Ref Rng 08/05/2014 05/23/2014 05/08/2014  Glucose 70 - 99 mg/dL - - 86  BUN 4 - 21 mg/dL - 26(A) 39(H)  Creatinine 0.6 - 1.3 mg/dL - 3.7(A) 5.94(H)  Sodium 137 - 147 mmol/L 136(A) 135(A) 139  Potassium 3.4 - 5.3 mmol/L 4.6 3.8 4.0  Chloride 96 - 112 mEq/L - - 98  CO2 19 - 32 mEq/L - - 25  Calcium 8.4 - 10.5 mg/dL - - 8.4  Total Protein 6.0 - 8.3 g/dL - - -  Total Bilirubin 0.3 - 1.2 mg/dL - - -  Alkaline Phos 39 - 117 U/L - - -  AST 0 - 37 U/L - - -  ALT 0 - 53 U/L - - -    Lab Results  Component Value Date   HGBA1C 5.9* 02/06/2014    Lab Results  Component Value Date   TSH 1.050 12/21/2013    Lipid Panel     Component Value Date/Time   CHOL 157 04/30/2014   TRIG 82 04/30/2014   HDL 39 04/30/2014   CHOLHDL 4.7 09/20/2011 0822   VLDL 24 09/20/2011 0822   LDLCALC 102 04/30/2014    Assessment & Plan 1. End stage renal disease on dialysis Stable. Continue HD on Mon, Wed, and Fri, 1200cc fluid restriction, Renvela 1600mg  three times daily with meals, and renal diet. Continue to f/u with nephrology for renal concerns  2. Calciphylaxis of right lower extremity with nonhealing ulcer, with unspecified severity Improving. Continue wound care with NS, dry collagen, and foam dressing every other day. Continue tylenol 650mg   every six hours as needed with norco 5/325mg  every six hours as needed for pain. Continue to monitor his status.   3. Dyslipidemia  LDL 102. Continue lipitor 20mg  daily.  4. Peripheral neuropathy Stable. Continue  gabapentin 100mg  daily.  5. Hypotension, unspecified hypotension type Stable. BP mostly 130s/70s. Continue midodrine 10mg  three times daily. Monitor bp  6. S/p left aka Surgical incision completely heel. Has prosthetic and is working for therapy. Continue to work with PT/OT for gait/strength/balance training to restore/maximze function. Fall risk precautions  7. Allergic rhinitis Claritin 5mg  daily x 7 days with flonase nasal spray daily x 7 days then discontinue. Reassess and continue to monitor  8. Headache, unspecified Sinus headache vs. Migraines. See #7. Continue tylenol 650mg  every 6 hours as needed and norco 5/325mg  every six hours as needed for pain for now. Reassess in 1 week and continue to monitor his status.   Family/Staff Communication Plan of care discussed with resident and nursing staff. Resident and nursing staff verbalized understanding and agree with plan of care. No additional questions or concerns reported.    Arthur Holms, MSN, AGNP-C [facility] [address] [place],  [address] [phone] [8am-5pm] After hours: [phone]

## 2014-12-02 ENCOUNTER — Non-Acute Institutional Stay (SKILLED_NURSING_FACILITY): Payer: Medicare Other | Admitting: Nurse Practitioner

## 2014-12-02 DIAGNOSIS — L97919 Non-pressure chronic ulcer of unspecified part of right lower leg with unspecified severity: Secondary | ICD-10-CM

## 2014-12-02 DIAGNOSIS — Z89612 Acquired absence of left leg above knee: Secondary | ICD-10-CM

## 2014-12-02 DIAGNOSIS — R51 Headache: Secondary | ICD-10-CM

## 2014-12-02 DIAGNOSIS — G629 Polyneuropathy, unspecified: Secondary | ICD-10-CM | POA: Diagnosis not present

## 2014-12-02 DIAGNOSIS — N186 End stage renal disease: Secondary | ICD-10-CM

## 2014-12-02 DIAGNOSIS — R519 Headache, unspecified: Secondary | ICD-10-CM

## 2014-12-02 DIAGNOSIS — Z992 Dependence on renal dialysis: Secondary | ICD-10-CM

## 2014-12-02 DIAGNOSIS — I959 Hypotension, unspecified: Secondary | ICD-10-CM

## 2014-12-02 DIAGNOSIS — E785 Hyperlipidemia, unspecified: Secondary | ICD-10-CM

## 2014-12-02 NOTE — Progress Notes (Signed)
Patient ID: Sean [name], male   DOB: [birth year], 79 y.o.   MRN: [id number]    Nursing Home Location:  [place] of Service: SNF (31)  PCP: Sheela Stack, MD  Allergies  Allergen Reactions  . Benazepril Hcl Hives, Itching and Rash    flareups on patient head    Chief Complaint  Patient presents with  . Medical Management of Chronic Issues    HPI:  Patient is a 79 y.o. male seen today at [facility] for routine follow up on chronic conditions. Pt with a pmh of DM2, ESRD on HD, s/p Left AKA, hypotension, HLD, anemia, DM2, depression. Pt wanted a medication review of his medication, reports he is taking too much in the evening. Reports ongoing headaches and allergic rhinitis but does not wish to take Flonase PRN or any additional medication. Pt conts HD Monday, Wednesday, and Friday.    Review of Systems:  Review of Systems  Constitutional: Negative for activity change, appetite change, fatigue and unexpected weight change.  HENT: Positive for congestion. Negative for hearing loss.   Eyes: Negative.   Respiratory: Negative for cough and shortness of breath.   Cardiovascular: Negative for chest pain, palpitations and leg swelling.  Gastrointestinal: Negative for abdominal pain, diarrhea and constipation.  Genitourinary:       HD M,W,F  Musculoskeletal: Negative for myalgias and arthralgias.  Skin: Negative for color change and wound.  Neurological: Positive for numbness. Negative for dizziness and weakness.  Psychiatric/Behavioral: Negative for behavioral problems, confusion and agitation.    Past Medical History  Diagnosis Date  . Hyperparathyroidism   . Arthritis   . Hyperlipidemia   . Hypertension   . Neuromuscular disorder   . Stroke   . GERD (gastroesophageal reflux disease)   . Migraine   . Heart attack 2011; 1990's  . Peripheral neuropathy   . Anxiety   . Depression   . Dialysis patient     M, W, F;  [place] CAD (coronary artery disease)     a. s/p NSTEMI and failed RCA PCI in 2011. b. Nuc 12/2013: nonischemic, prior inferior MI.  Marland Kitchen LBBB (left bundle branch block)   . Calciphylaxis     a. Multiple admissions for nonhealing L ulcer/sepsis, s/p L AKA 02/02/2014.  Marland Kitchen Chronic anemia   . Dementia   . ESRD (end stage renal disease) on dialysis   . Renal insufficiency   . Generalized weakness   . Hypotension     renovascular  . History of left bundle branch block   . Type II diabetes mellitus     not taking any medications.   Past Surgical History  Procedure Laterality Date  . Parathyroidectomy  2011  . Capd insertion  09/12/2011    Procedure: LAPAROSCOPIC INSERTION CONTINUOUS AMBULATORY PERITONEAL DIALYSIS  (CAPD) CATHETER;  Surgeon: Edward Jolly, MD;  Location: [place];  Service: General;  Laterality: N/A;  . Sp av dialysis shunt access existing *l*  2003  . Dg av dialysis shunt intro ndl*r* or      "not working" (09/19/11)  . Capd removal  03/12/2012    Procedure: CONTINUOUS AMBULATORY PERITONEAL DIALYSIS  (CAPD) CATHETER REMOVAL;  Surgeon: Edward Jolly, MD;  Location: [place];  Service: General;  Laterality: N/A;  . Amputation Left 02/02/2014    Procedure: AMPUTATION ABOVE KNEE-LEFT;  Surgeon: Elam Dutch, MD;  Location: [place];  Service: Vascular;  Laterality:  Left;  . I&d extremity Left 03/31/2014    Procedure: IRRIGATION AND DEBRIDEMENT EXTREMITY - LEFT ABOVE KNEE AMPUTATION;  Surgeon: Elam Dutch, MD;  Location: [place];  Service: Vascular;  Laterality: Left;  . Application of wound vac Left 03/31/2014    Procedure: APPLICATION OF WOUND VAC - LEFT AKA;  Surgeon: Elam Dutch, MD;  Location: [place];  Service: Vascular;  Laterality: Left;  . Colon surgery  2011    colon resection   . Colonoscopy with propofol N/A 07/14/2014    Procedure: COLONOSCOPY WITH PROPOFOL;  Surgeon: Jerene Bears, MD;  Location: [facility];  Service: Gastroenterology;   Laterality: N/A;   Social History:   reports that he quit smoking about 38 years ago. His smoking use included Cigarettes. He has a 1.75 pack-year smoking history. He has never used smokeless tobacco. He reports that he does not drink alcohol or use illicit drugs.  Family History  Problem Relation Age of Onset  . Cancer Sister     breast  . Cancer Brother     lung  . Anesthesia problems Neg Hx   . Hypotension Neg Hx   . Malignant hyperthermia Neg Hx   . Pseudochol deficiency Neg Hx     Medications: Patient's Medications  New Prescriptions   No medications on file  Previous Medications   ACETAMINOPHEN (TYLENOL) 325 MG TABLET    Take 2 tablets (650 mg total) by mouth every 6 (six) hours as needed for mild pain, fever or headache.   ALBUTEROL (PROVENTIL) (2.5 MG/3ML) 0.083% NEBULIZER SOLUTION    Take 3 mLs (2.5 mg total) by nebulization every 6 (six) hours as needed for wheezing or shortness of breath.   AMINO ACIDS-PROTEIN HYDROLYS (FEEDING SUPPLEMENT, PRO-STAT SUGAR FREE 64,) LIQD    Take 30 mLs by mouth 2 (two) times daily between meals.   ASPIRIN EC 81 MG TABLET    Take 81 mg by mouth every morning.    ATORVASTATIN (LIPITOR) 20 MG TABLET    Take 20 mg by mouth at bedtime.   B-COMPLEX W/C (TOTAL B/C) TABS TABLET    Take 1 tablet by mouth at bedtime.   COLLAGENASE (SANTYL) OINTMENT    Apply 1 application topically daily. Apply to right lower leg wound   GABAPENTIN (NEURONTIN) 100 MG CAPSULE    Take 100 mg by mouth at bedtime.    HYDROCODONE-ACETAMINOPHEN (NORCO/VICODIN) 5-325 MG PER TABLET    Take 1 tablet by mouth every 6 (six) hours as needed for moderate pain.   MIDODRINE (PROAMATINE) 10 MG TABLET    Take 1 tablet (10 mg total) by mouth 3 (three) times daily with meals.   SEVELAMER CARBONATE (RENVELA) 800 MG TABLET    Take 1,600 mg by mouth 3 (three) times daily with meals.  Modified Medications   No medications on file  Discontinued Medications   No medications on file      Physical Exam: Filed Vitals:   12/02/14 1548  BP: 130/79  Pulse: 72  Temp: 97.5 F (36.4 C)  Resp: 20    Physical Exam  Constitutional: He is oriented to person, place, and time. He appears well-developed and well-nourished. No distress.  HENT:  Head: Normocephalic and atraumatic.  Mouth/Throat: Oropharynx is clear and moist. No oropharyngeal exudate.  Eyes: Conjunctivae and EOM are normal. Pupils are equal, round, and reactive to light.  Neck: Normal range of motion. Neck supple.  Cardiovascular: Normal rate, regular rhythm and normal heart sounds.  Left AVF with positive bruit and thrill.   Pulmonary/Chest: Effort normal and breath sounds normal.  Abdominal: Soft. Bowel sounds are normal.  Musculoskeletal: He exhibits no edema or tenderness.  S/p left AKA  Neurological: He is alert and oriented to person, place, and time.  Skin: Skin is warm and dry. He is not diaphoretic.  Right LE nonhealing ulcer, followed by treatment nurse, without change. No signs of infection noted  Psychiatric: He has a normal mood and affect.    Labs reviewed: Basic Metabolic Panel:  Recent Labs  02/04/14 0350  05/06/14 0344 05/07/14 0430 05/08/14 0733 05/23/14 08/05/14  NA 137  < > 136* 137 139 135* 136*  K 4.0  < > 4.6 4.4 4.0 3.8 4.6  CL 98  < > 96 98 98  --   --   CO2 23  < > 22 21 25   --   --   GLUCOSE 111*  < > 64* 66* 86  --   --   BUN 27*  < > 20 28* 39* 26*  --   CREATININE 6.02*  < > 3.63* 4.87* 5.94* 3.7*  --   CALCIUM 8.4  < > 8.3* 9.0 8.4  --   --   MG 1.9  --   --   --   --   --   --   PHOS 4.0  < > 3.3 3.6 4.2  --   --   < > = values in this interval not displayed. Liver Function Tests:  Recent Labs  02/05/14 0300  05/03/14 0805 05/04/14 0347  05/06/14 0344 05/07/14 0430 05/08/14 0733  AST 38*  --  76* 65*  --   --   --   --   ALT <5  --  15 14  --   --   --   --   ALKPHOS 86  --  109 105  --   --   --   --   BILITOT 0.5  --  0.5 0.5  --   --   --   --    PROT 5.8*  --  7.9 7.1  --   --   --   --   ALBUMIN 1.2*  < > 2.5* 2.1*  < > 2.1* 2.2* 2.1*  < > = values in this interval not displayed.  Recent Labs  05/03/14 0805  LIPASE 25   No results for input(s): AMMONIA in the last 8760 hours. CBC:  Recent Labs  01/27/14 2100  05/03/14 0805  05/07/14 0430 05/08/14 0755 05/23/14 05/26/14 1134 07/29/14 08/26/14  WBC 10.9*  < > 18.0*  < > 7.0 6.0 4.8 5.1  --   --   NEUTROABS 8.1*  --  15.0*  --   --   --   --  3.2  --   --   HGB 8.5*  < > 11.4*  < > 11.9* 11.9* 8.8* 9.5* 12.4* 11.1*  HCT 27.4*  < > 38.1*  < > 38.8* 38.2* 29* 30.8* 41 35*  MCV 86.2  < > 86.2  < > 82.2 81.1  --  81.2  --   --   PLT 295  < > 270  < > 271 303 288 330.0  --   --   < > = values in this interval not displayed. TSH:  Recent Labs  12/21/13 0431  TSH 1.050   A1C: Lab Results  Component Value Date   HGBA1C 5.9*  02/06/2014   Lipid Panel:  Recent Labs  04/30/14  CHOL 157  HDL 39  LDLCALC 102  TRIG 82    Assessment/Plan 1. Hypotension, unspecified hypotension type -pts blood pressures have been stable, pt really wanting to reduce medications, denies hypotension, will reduce midodrine to 5 mg TID, will have staff check VS q shift.  2. Peripheral neuropathy Conts on gabapentin daily   3. End stage renal disease on dialysis Continue HD on Mon, Wed, and Fri, 1200cc fluid restriction, pt dicussed need for Renvela 1600mg  three times daily with meals with nephrology and understands he needs to take this Continue renal diet.  Continue to f/u with nephrology  4. Calciphylaxis of right lower extremity with nonhealing ulcer, with unspecified severity Stable, conts wound care by treatment nurse, has PRN ofr pain if needed  5. Dyslipidemia conts lipitor   6. S/P AKA (above knee amputation) unilateral, left -surgical incision healed, conts with prosthetic.   7. Headache, unspecified headache type Ongoing, will monitor for now, has Norco/tylenol PRN  if needed    Jolynda Townley K. Harle Battiest  [facility] [phone] 8 am - 5 pm) [phone] (after hours)

## 2014-12-23 LAB — HEPATIC FUNCTION PANEL
ALT: 11 U/L (ref 10–40)
AST: 12 U/L — AB (ref 14–40)
Bilirubin, Total: 0.5 mg/dL

## 2014-12-23 LAB — BASIC METABOLIC PANEL
BUN: 84 mg/dL — AB (ref 4–21)
Creatinine: 6.6 mg/dL — AB (ref 0.6–1.3)
GLUCOSE: 152 mg/dL
POTASSIUM: 4.9 mmol/L (ref 3.4–5.3)

## 2014-12-23 LAB — HEMOGLOBIN A1C: HEMOGLOBIN A1C: 6.5

## 2015-01-12 ENCOUNTER — Non-Acute Institutional Stay (SKILLED_NURSING_FACILITY): Payer: Medicare Other | Admitting: Internal Medicine

## 2015-01-12 DIAGNOSIS — E785 Hyperlipidemia, unspecified: Secondary | ICD-10-CM | POA: Diagnosis not present

## 2015-01-12 DIAGNOSIS — E119 Type 2 diabetes mellitus without complications: Secondary | ICD-10-CM | POA: Diagnosis not present

## 2015-01-12 DIAGNOSIS — I739 Peripheral vascular disease, unspecified: Secondary | ICD-10-CM

## 2015-01-12 DIAGNOSIS — Z992 Dependence on renal dialysis: Secondary | ICD-10-CM

## 2015-01-12 DIAGNOSIS — N186 End stage renal disease: Secondary | ICD-10-CM | POA: Diagnosis not present

## 2015-01-12 DIAGNOSIS — I953 Hypotension of hemodialysis: Secondary | ICD-10-CM

## 2015-01-12 DIAGNOSIS — M792 Neuralgia and neuritis, unspecified: Secondary | ICD-10-CM

## 2015-01-12 NOTE — Progress Notes (Signed)
Patient ID: Sean [name], male   DOB: [birth year], 79 y.o.   MRN: [id number]    Facility: [facility]   Chief Complaint  Patient presents with  . Medical Management of Chronic Issues   Allergies  Allergen Reactions  . Benazepril Hcl Hives, Itching and Rash    flareups on patient head   Code status: full code  HPI 79 y/o male patient is seen today for routine visit. He has PMH of DM2, ESRD on HD, PVD s/p Left AKA, hypotension, HLD, anemia, DM2, depression among others. He would like his renvela changed. He had been constipated for few days and had a bowel movement today and feels good. No other concerns. He goes for dialysis 3 days a week. Compliant with his medication. Tolerating reduced dosing of midodrine well.  Review of Systems  Constitutional: Negative for fever, chills, diaphoresis.  HENT: Negative for congestion, hearing loss and sore throat.   Eyes: Negative for blurred vision, double vision and discharge.  Respiratory: Negative for cough, sputum production, shortness of breath and wheezing.   Cardiovascular: Negative for chest pain, palpitations.  Gastrointestinal: Negative for heartburn, nausea, vomiting, abdominal pain Genitourinary: anuric Musculoskeletal: Negative for back pain or falls.  Skin: Negative for itching and rash. has calciphylaxis Neurological: Negative for dizziness, tingling and headaches.    Past Medical History  Diagnosis Date  . Hyperparathyroidism   . Arthritis   . Hyperlipidemia   . Hypertension   . Neuromuscular disorder   . Stroke   . GERD (gastroesophageal reflux disease)   . Migraine   . Heart attack 2011; 1990's  . Peripheral neuropathy   . Anxiety   . Depression   . Dialysis patient     M, W, F; [place] CAD (coronary artery disease)     a. s/p NSTEMI and failed RCA PCI in 2011. b. Nuc 12/2013: nonischemic, prior inferior MI.  Marland Kitchen LBBB (left bundle branch block)   . Calciphylaxis     a.  Multiple admissions for nonhealing L ulcer/sepsis, s/p L AKA 02/02/2014.  Marland Kitchen Chronic anemia   . Dementia   . ESRD (end stage renal disease) on dialysis   . Renal insufficiency   . Generalized weakness   . Hypotension     renovascular  . History of left bundle branch block   . Type II diabetes mellitus     not taking any medications.     Medication List       This list is accurate as of: 01/12/15  4:57 PM.  Always use your most recent med list.               acetaminophen 325 MG tablet  Commonly known as:  TYLENOL  Take 2 tablets (650 mg total) by mouth every 6 (six) hours as needed for mild pain, fever or headache.     albuterol (2.5 MG/3ML) 0.083% nebulizer solution  Commonly known as:  PROVENTIL  Take 3 mLs (2.5 mg total) by nebulization every 6 (six) hours as needed for wheezing or shortness of breath.     aspirin EC 81 MG tablet  Take 81 mg by mouth every morning.     atorvastatin 20 MG tablet  Commonly known as:  LIPITOR  Take 20 mg by mouth at bedtime.     b-complex w/C Tabs tablet  Take 1 tablet by mouth at bedtime.     collagenase ointment  Commonly known as:  SANTYL  Apply 1  application topically daily. Apply to right lower leg wound     feeding supplement (PRO-STAT SUGAR FREE 64) Liqd  Take 30 mLs by mouth 2 (two) times daily between meals.     gabapentin 100 MG capsule  Commonly known as:  NEURONTIN  Take 100 mg by mouth at bedtime.     HYDROcodone-acetaminophen 5-325 MG per tablet  Commonly known as:  NORCO/VICODIN  Take 1 tablet by mouth every 6 (six) hours as needed for moderate pain.     midodrine 10 MG tablet  Commonly known as:  PROAMATINE  Take 1 tablet (10 mg total) by mouth 3 (three) times daily with meals.     sevelamer carbonate 800 MG tablet  Commonly known as:  RENVELA  Take 1,600 mg by mouth 3 (three) times daily with meals.        Physical exam BP 105/62 mmHg  Pulse 81  Temp(Src) 98.2 F (36.8 C)  Resp 18  Ht 5\' 5"  (1.651  m)  Wt 162 lb (73.483 kg)  BMI 26.96 kg/m2  Wt Readings from Last 3 Encounters:  01/12/15 162 lb (73.483 kg)  10/27/14 160 lb 3.2 oz (72.666 kg)  09/30/14 155 lb 9.6 oz (70.58 kg)   General- elderly male in no acute distress Head- atraumatic, normocephalic Eyes- PERRLA, EOMI, no pallor, no icterus Neck- no lymphadenopathy, no thyromegaly, no jugular vein distension Chest- no chest wall deformities, no chest wall tenderness Cardiovascular- normal s1,s2, no murmurs Respiratory- bilateral clear to auscultation, no wheeze, no rhonchi, no crackles Abdomen- bowel sounds present, soft, non tender Musculoskeletal- able to move all 4 extremities, left AKA with prosthetic leg Neurological- no focal deficit, alert and oriented to person, place and time Psychiatry- normal mood and affect Skin- calciphylaxis present in right leg, left arm AV fistula  Labs CBC Latest Ref Rng 08/26/2014 07/29/2014 05/26/2014  WBC 4.0 - 10.5 K/uL - - 5.1  Hemoglobin 13.5 - 17.5 g/dL 11.1(A) 12.4(A) 9.5(L)  Hematocrit 41 - 53 % 35(A) 41 30.8(L)  Platelets 150.0 - 400.0 K/uL - - 330.0   CMP Latest Ref Rng 08/05/2014 05/23/2014 05/08/2014  Glucose 70 - 99 mg/dL - - 86  BUN 4 - 21 mg/dL - 26(A) 39(H)  Creatinine 0.6 - 1.3 mg/dL - 3.7(A) 5.94(H)  Sodium 137 - 147 mmol/L 136(A) 135(A) 139  Potassium 3.4 - 5.3 mmol/L 4.6 3.8 4.0  Chloride 96 - 112 mEq/L - - 98  CO2 19 - 32 mEq/L - - 25  Calcium 8.4 - 10.5 mg/dL - - 8.4  Total Protein 6.0 - 8.3 g/dL - - -  Total Bilirubin 0.3 - 1.2 mg/dL - - -  Alkaline Phos 39 - 117 U/L - - -  AST 0 - 37 U/L - - -  ALT 0 - 53 U/L - - -     Assessment/plan  Hypotension Likely dialysis and old cva related. Stable at present, continue midodrine 5 mg tid and monitor  ESRD Continue HD 3 days a week, continue renvela for now and discuss with renal for change of the binders.  Hyperlipidemia On lipitor 20 mg daily, check lipid panel Lipid Panel     Component Value Date/Time     CHOL 157 04/30/2014   TRIG 82 04/30/2014   HDL 39 04/30/2014   CHOLHDL 4.7 09/20/2011 0822   VLDL 24 09/20/2011 0822   LDLCALC 102 04/30/2014   Neuropathic pain Stable, continue gabapentin 100 mg daily and monitor  PVD With ulceration before and s/p left  AKA. Ulcer healed. Continue skin care and aspirin  Calciphylaxis Has calciphylaxis on right LE. On norco 10/325 q6h prn pain with prn tylenol, monitor. Decrease norco to 1 tab q8h prn for pain and reassess. Continue aspirin  Dm Diet controlled at present. Check a1c Lab Results  Component Value Date   HGBA1C 5.9* 02/06/2014    Labs- lipid and a1c  [name], MD  [facility] [phone] (Monday-Friday 8 am - 5 pm) [phone] (afterhours)

## 2015-01-13 ENCOUNTER — Other Ambulatory Visit: Payer: Self-pay | Admitting: *Deleted

## 2015-01-13 MED ORDER — HYDROCODONE-ACETAMINOPHEN 5-325 MG PO TABS: 1.0000 | ORAL_TABLET | Freq: Four times a day (QID) | ORAL | Status: DC | PRN

## 2015-01-14 ENCOUNTER — Other Ambulatory Visit: Payer: Self-pay | Admitting: *Deleted

## 2015-01-14 MED ORDER — HYDROCODONE-ACETAMINOPHEN 5-325 MG PO TABS: 1.0000 | ORAL_TABLET | Freq: Four times a day (QID) | ORAL | Status: DC | PRN

## 2015-01-14 NOTE — Telephone Encounter (Signed)
[facility] 

## 2015-01-15 ENCOUNTER — Other Ambulatory Visit: Payer: Self-pay | Admitting: *Deleted

## 2015-01-15 MED ORDER — HYDROCODONE-ACETAMINOPHEN 10-325 MG PO TABS: ORAL_TABLET | ORAL | Status: DC

## 2015-01-15 NOTE — Telephone Encounter (Signed)
[facility] 

## 2015-01-20 LAB — HEPATIC FUNCTION PANEL
ALT: 12 U/L (ref 10–40)
AST: 11 U/L — AB (ref 14–40)
Alkaline Phosphatase: 187 U/L — AB (ref 25–125)
Bilirubin, Total: 0.5 mg/dL

## 2015-01-20 LAB — LIPID PANEL
Cholesterol: 136 mg/dL (ref 0–200)
HDL: 57 mg/dL (ref 35–70)
LDL Cholesterol: 73 mg/dL
TRIGLYCERIDES: 30 mg/dL — AB (ref 40–160)

## 2015-01-20 LAB — CBC AND DIFFERENTIAL
HEMATOCRIT: 34 % — AB (ref 41–53)
Hemoglobin: 11 g/dL — AB (ref 13.5–17.5)

## 2015-01-20 LAB — BASIC METABOLIC PANEL
BUN: 84 mg/dL — AB (ref 4–21)
CREATININE: 6.6 mg/dL — AB (ref 0.6–1.3)
Glucose: 152 mg/dL
POTASSIUM: 4.9 mmol/L (ref 3.4–5.3)
SODIUM: 135 mmol/L — AB (ref 137–147)

## 2015-01-20 LAB — HEMOGLOBIN A1C: Hgb A1c MFr Bld: 6.5 % — AB (ref 4.0–6.0)

## 2015-02-24 ENCOUNTER — Non-Acute Institutional Stay (SKILLED_NURSING_FACILITY): Payer: Medicare Other | Admitting: Nurse Practitioner

## 2015-02-24 DIAGNOSIS — E119 Type 2 diabetes mellitus without complications: Secondary | ICD-10-CM | POA: Diagnosis not present

## 2015-02-24 DIAGNOSIS — I953 Hypotension of hemodialysis: Secondary | ICD-10-CM

## 2015-02-24 DIAGNOSIS — Z992 Dependence on renal dialysis: Secondary | ICD-10-CM | POA: Diagnosis not present

## 2015-02-24 DIAGNOSIS — I2510 Atherosclerotic heart disease of native coronary artery without angina pectoris: Secondary | ICD-10-CM

## 2015-02-24 DIAGNOSIS — N186 End stage renal disease: Secondary | ICD-10-CM

## 2015-02-24 NOTE — Progress Notes (Signed)
Patient ID: Sean [name], male   DOB: Aug 18, 1935, 79 y.o.   MRN: [id number]    Nursing Home Location:  [place] of Service: SNF (31)  PCP: Sheela Stack, MD  Allergies  Allergen Reactions  . Benazepril Hcl Hives, Itching and Rash    flareups on patient head    Chief Complaint  Patient presents with  . Medical Management of Chronic Issues    HPI:  Patient is a 79 y.o. male seen today at [facility] for medical management of his chronic issues.  He has a past medical history of DM, ESRD, hypotension, HLD, anemia and depression.  He is feeling well today, he had just completed his HD treatment.  No complaints of pain.  Nursing without any acute concerns.    Review of Systems:  Review of Systems  Constitutional: Negative for fever, chills and fatigue.  HENT: Negative.   Respiratory: Negative for cough, shortness of breath and wheezing.   Cardiovascular: Negative for chest pain, palpitations and leg swelling.  Gastrointestinal: Negative for abdominal pain, diarrhea and constipation.  Musculoskeletal: Negative for joint swelling and arthralgias.  Skin: Negative.   Psychiatric/Behavioral: Negative.     Past Medical History  Diagnosis Date  . Hyperparathyroidism   . Arthritis   . Hyperlipidemia   . Hypertension   . Neuromuscular disorder   . Stroke   . GERD (gastroesophageal reflux disease)   . Migraine   . Heart attack 2011; 1990's  . Peripheral neuropathy   . Anxiety   . Depression   . Dialysis patient     M, W, F; [place] CAD (coronary artery disease)     a. s/p NSTEMI and failed RCA PCI in 2011. b. Nuc 12/2013: nonischemic, prior inferior MI.  Marland Kitchen LBBB (left bundle branch block)   . Calciphylaxis     a. Multiple admissions for nonhealing L ulcer/sepsis, s/p L AKA 02/02/2014.  Marland Kitchen Chronic anemia   . Dementia   . ESRD (end stage renal disease) on dialysis   . Renal insufficiency   . Generalized  weakness   . Hypotension     renovascular  . History of left bundle branch block   . Type II diabetes mellitus     not taking any medications.   Past Surgical History  Procedure Laterality Date  . Parathyroidectomy  2011  . Capd insertion  09/12/2011    Procedure: LAPAROSCOPIC INSERTION CONTINUOUS AMBULATORY PERITONEAL DIALYSIS  (CAPD) CATHETER;  Surgeon: Edward Jolly, MD;  Location: [place];  Service: General;  Laterality: N/A;  . Sp av dialysis shunt access existing *l*  2003  . Dg av dialysis shunt intro ndl*r* or      "not working" (09/19/11)  . Capd removal  03/12/2012    Procedure: CONTINUOUS AMBULATORY PERITONEAL DIALYSIS  (CAPD) CATHETER REMOVAL;  Surgeon: Edward Jolly, MD;  Location: [place];  Service: General;  Laterality: N/A;  . Amputation Left 02/02/2014    Procedure: AMPUTATION ABOVE KNEE-LEFT;  Surgeon: Elam Dutch, MD;  Location: [place];  Service: Vascular;  Laterality: Left;  . I&d extremity Left 03/31/2014    Procedure: IRRIGATION AND DEBRIDEMENT EXTREMITY - LEFT ABOVE KNEE AMPUTATION;  Surgeon: Elam Dutch, MD;  Location: [place];  Service: Vascular;  Laterality: Left;  . Application of wound vac Left 03/31/2014    Procedure: APPLICATION OF WOUND VAC - LEFT AKA;  Surgeon: Elam Dutch, MD;  Location: [facility];  Service: Vascular;  Laterality: Left;  . Colon surgery  2011    colon resection   . Colonoscopy with propofol N/A 07/14/2014    Procedure: COLONOSCOPY WITH PROPOFOL;  Surgeon: Jerene Bears, MD;  Location: [facility];  Service: Gastroenterology;  Laterality: N/A;   Social History:   reports that he quit smoking about 38 years ago. His smoking use included Cigarettes. He has a 1.75 pack-year smoking history. He has never used smokeless tobacco. He reports that he does not drink alcohol or use illicit drugs.  Family History  Problem Relation Age of Onset  . Cancer Sister     breast  . Cancer Brother     lung  . Anesthesia problems Neg Hx   .  Hypotension Neg Hx   . Malignant hyperthermia Neg Hx   . Pseudochol deficiency Neg Hx     Medications: Patient's Medications  New Prescriptions   No medications on file  Previous Medications   ACETAMINOPHEN (TYLENOL) 325 MG TABLET    Take 2 tablets (650 mg total) by mouth every 6 (six) hours as needed for mild pain, fever or headache.   ALBUTEROL (PROVENTIL) (2.5 MG/3ML) 0.083% NEBULIZER SOLUTION    Take 3 mLs (2.5 mg total) by nebulization every 6 (six) hours as needed for wheezing or shortness of breath.   AMINO ACIDS-PROTEIN HYDROLYS (FEEDING SUPPLEMENT, PRO-STAT SUGAR FREE 64,) LIQD    Take 30 mLs by mouth 2 (two) times daily between meals.   ASPIRIN EC 81 MG TABLET    Take 81 mg by mouth every morning.    ATORVASTATIN (LIPITOR) 20 MG TABLET    Take 20 mg by mouth at bedtime.   B-COMPLEX W/C (TOTAL B/C) TABS TABLET    Take 1 tablet by mouth at bedtime.   COLLAGENASE (SANTYL) OINTMENT    Apply 1 application topically daily. Apply to right lower leg wound   GABAPENTIN (NEURONTIN) 100 MG CAPSULE    Take 100 mg by mouth at bedtime.    HYDROCODONE-ACETAMINOPHEN (NORCO) 10-325 MG PER TABLET    Take one tablet by mouth every 6 hours as needed for moderate pain. Do not exceed 4gm of Tylenol in 24 hours   MIDODRINE (PROAMATINE) 10 MG TABLET    Take 1 tablet (10 mg total) by mouth 3 (three) times daily with meals.   SEVELAMER CARBONATE (RENVELA) 800 MG TABLET    Take 1,600 mg by mouth 3 (three) times daily with meals.  Modified Medications   No medications on file  Discontinued Medications   No medications on file     Physical Exam: Filed Vitals:   02/24/15 1404  BP: 116/58  Pulse: 63  Temp: 96.8 F (36 C)  TempSrc: Oral  Resp: 20  Weight: 165 lb (74.844 kg)    Physical Exam  Constitutional: He is oriented to person, place, and time. He appears well-developed and well-nourished.  HENT:  Head: Normocephalic and atraumatic.  Eyes: Pupils are equal, round, and reactive to light.    Neck: Normal range of motion. Neck supple.  Cardiovascular: Normal rate, regular rhythm and normal heart sounds.   Pulmonary/Chest: Effort normal and breath sounds normal.  Abdominal: Soft. Bowel sounds are normal.  Musculoskeletal: Normal range of motion.  Lymphadenopathy:    He has no cervical adenopathy.  Neurological: He is alert and oriented to person, place, and time.  Skin: Skin is warm and dry.  Psychiatric: He has a normal mood and affect.  Labs reviewed: Basic Metabolic Panel:  Recent Labs  05/06/14 0344 05/07/14 0430 05/08/14 0733 05/23/14 08/05/14 01/20/15 1031  NA 136* 137 139 135* 136* 135*  K 4.6 4.4 4.0 3.8 4.6 4.9  CL 96 98 98  --   --   --   CO2 22 21 25   --   --   --   GLUCOSE 64* 66* 86  --   --   --   BUN 20 28* 39* 26*  --  84*  CREATININE 3.63* 4.87* 5.94* 3.7*  --  6.6*  CALCIUM 8.3* 9.0 8.4  --   --   --   PHOS 3.3 3.6 4.2  --   --   --    Liver Function Tests:  Recent Labs  05/03/14 0805 05/04/14 0347  05/06/14 0344 05/07/14 0430 05/08/14 0733 01/20/15 1031  AST 76* 65*  --   --   --   --  11*  ALT 15 14  --   --   --   --  12  ALKPHOS 109 105  --   --   --   --  187*  BILITOT 0.5 0.5  --   --   --   --   --   PROT 7.9 7.1  --   --   --   --   --   ALBUMIN 2.5* 2.1*  < > 2.1* 2.2* 2.1*  --   < > = values in this interval not displayed.  Recent Labs  05/03/14 0805  LIPASE 25   No results for input(s): AMMONIA in the last 8760 hours. CBC:  Recent Labs  05/03/14 0805  05/07/14 0430 05/08/14 0755 05/23/14 05/26/14 1134 07/29/14 08/26/14 01/20/15 1031  WBC 18.0*  < > 7.0 6.0 4.8 5.1  --   --   --   NEUTROABS 15.0*  --   --   --   --  3.2  --   --   --   HGB 11.4*  < > 11.9* 11.9* 8.8* 9.5* 12.4* 11.1* 11.0*  HCT 38.1*  < > 38.8* 38.2* 29* 30.8* 41 35* 34*  MCV 86.2  < > 82.2 81.1  --  81.2  --   --   --   PLT 270  < > 271 303 288 330.0  --   --   --   < > = values in this interval not displayed. TSH: No results for  input(s): TSH in the last 8760 hours. A1C: Lab Results  Component Value Date   HGBA1C 6.5* 01/20/2015   Lipid Panel:  Recent Labs  04/30/14  CHOL 157  HDL 39  LDLCALC 102  TRIG 82    Assessment/Plan 1. Hemodialysis-associated hypotension Stable.  Blood pressures are within desirable range.  Midodrine was reduced in June, tolerating 5 mg TID, will continue at this time .    2. Diabetes type 2, controlled Stable.  Last A1C was 6.5.  Continue renal/diabetic diet.    3. End stage renal disease on dialysis Stable.  HD on M/W/F.  Follows with nephrology.   4. CAD in native artery Chest pain free.  S/p stent in RCA in 2011, no complaints of chest pains, Continue daily 81mg  ASA.     Carlos American. Harle Battiest  [facility] [phone] 8 am - 5 pm) [phone] (after hours)

## 2015-02-28 NOTE — Progress Notes (Signed)
Cardiology Office Note   Date:  03/01/2015   ID:  Sean [name], DOB 1936-05-03, MRN [id number]  PCP:  Sheela Stack, MD  Cardiologist:  Sinclair Grooms, MD   Chief Complaint  Patient presents with  . Coronary Artery Disease      History of Present Illness: Sean [name] is a 79 y.o. male who presents for known calcific coronary disease with high-grade right coronary disease (failed PCI 2011), end-stage renal disease, chronic combined systolic and diastolic heart failure, hyperlipidemia, peripheral arterial disease, and anxiety disorder.  Patient is in a nursing home. He is status post left above-the-knee amputation greater than one year ago. He presented with pain and ischemia in the leg. He had positive enzymes. He was to have coronary angiography but refused. He has an antecedent history of calcified coronary arteries and failed PCI attempt in 2011 by Dr. Ulice Dash.Varanasi due to an on dilatable lesion. The patient is been asymptomatic with reference to angina. He denies dyspnea. He is on chronic dialysis for end-stage kidney disease. He has some mobility now with an above-the-knee prosthesis for the left leg. He is in today for routine follow-up. From this perspective he has had no acute cardiac issues since I last saw him greater than a year ago.    Past Medical History  Diagnosis Date  . Hyperparathyroidism   . Arthritis   . Hyperlipidemia   . Hypertension   . Neuromuscular disorder   . Stroke   . GERD (gastroesophageal reflux disease)   . Migraine   . Heart attack 2011; 1990's  . Peripheral neuropathy   . Anxiety   . Depression   . Dialysis patient     M, W, F; [place] CAD (coronary artery disease)     a. s/p NSTEMI and failed RCA PCI in 2011. b. Nuc 12/2013: nonischemic, prior inferior MI.  Marland Kitchen LBBB (left bundle branch block)   . Calciphylaxis     a. Multiple admissions for nonhealing L ulcer/sepsis, s/p L AKA 02/02/2014.  Marland Kitchen Chronic anemia     . Dementia   . ESRD (end stage renal disease) on dialysis   . Renal insufficiency   . Generalized weakness   . Hypotension     renovascular  . History of left bundle branch block   . Type II diabetes mellitus     not taking any medications.    Past Surgical History  Procedure Laterality Date  . Parathyroidectomy  2011  . Capd insertion  09/12/2011    Procedure: LAPAROSCOPIC INSERTION CONTINUOUS AMBULATORY PERITONEAL DIALYSIS  (CAPD) CATHETER;  Surgeon: Edward Jolly, MD;  Location: [place];  Service: General;  Laterality: N/A;  . Sp av dialysis shunt access existing *l*  2003  . Dg av dialysis shunt intro ndl*r* or      "not working" (09/19/11)  . Capd removal  03/12/2012    Procedure: CONTINUOUS AMBULATORY PERITONEAL DIALYSIS  (CAPD) CATHETER REMOVAL;  Surgeon: Edward Jolly, MD;  Location: [place];  Service: General;  Laterality: N/A;  . Amputation Left 02/02/2014    Procedure: AMPUTATION ABOVE KNEE-LEFT;  Surgeon: Elam Dutch, MD;  Location: [place];  Service: Vascular;  Laterality: Left;  . I&d extremity Left 03/31/2014    Procedure: IRRIGATION AND DEBRIDEMENT EXTREMITY - LEFT ABOVE KNEE AMPUTATION;  Surgeon: Elam Dutch, MD;  Location: [place];  Service: Vascular;  Laterality: Left;  . Application of wound vac Left 03/31/2014    Procedure:  APPLICATION OF WOUND VAC - LEFT AKA;  Surgeon: Elam Dutch, MD;  Location: [place];  Service: Vascular;  Laterality: Left;  . Colon surgery  2011    colon resection   . Colonoscopy with propofol N/A 07/14/2014    Procedure: COLONOSCOPY WITH PROPOFOL;  Surgeon: Jerene Bears, MD;  Location: [facility];  Service: Gastroenterology;  Laterality: N/A;     Current Outpatient Prescriptions  Medication Sig Dispense Refill  . Amino Acids-Protein Hydrolys (FEEDING SUPPLEMENT, PRO-STAT SUGAR FREE 64,) LIQD Take 30 mLs by mouth 2 (two) times daily between meals. 900 mL 0  . aspirin EC 81 MG tablet Take 81 mg by mouth every morning.     Marland Kitchen  atorvastatin (LIPITOR) 20 MG tablet Take 20 mg by mouth at bedtime.    Marland Kitchen b-complex w/C (TOTAL B/C) TABS tablet Take 1 tablet by mouth at bedtime.    . collagenase (SANTYL) ointment Apply 1 application topically daily. Apply to right lower leg wound    . gabapentin (NEURONTIN) 100 MG capsule Take 100 mg by mouth at bedtime.     Marland Kitchen HYDROcodone-acetaminophen (NORCO) 10-325 MG per tablet Take one tablet by mouth every 6 hours as needed for moderate pain. Do not exceed 4gm of Tylenol in 24 hours 120 tablet 0  . midodrine (PROAMATINE) 5 MG tablet Take 5 mg by mouth 3 (three) times daily. Do not take 6 am dose on dialysis days.    . sevelamer carbonate (RENVELA) 800 MG tablet Take 1,600 mg by mouth 3 (three) times daily with meals.     No current facility-administered medications for this visit.    Allergies:   Benazepril hcl    Social History:  The patient  reports that he quit smoking about 38 years ago. His smoking use included Cigarettes. He has a 1.75 pack-year smoking history. He has never used smokeless tobacco. He reports that he does not drink alcohol or use illicit drugs.   Family History:  The patient's family history includes Cancer in his brother and sister. There is no history of Anesthesia problems, Hypotension, Malignant hyperthermia, or Pseudochol deficiency.    ROS:  Please see the history of present illness.   Otherwise, review of systems are positive for feeling weak after dialysis but otherwise no complaints.   All other systems are reviewed and negative.    PHYSICAL EXAM: VS:  Pulse 87  Ht 5\' 5"  (1.651 m)  Wt 77.111 kg (170 lb)  BMI 28.29 kg/m2 , BMI Body mass index is 28.29 kg/(m^2). unable to record the blood pressure because of access catheters/fistulous in both arms, left amputation, and right leg pain. GEN: Well nourished, well developed, in no acute distress HEENT: normal Neck: no JVD, carotid bruits, or masses Cardiac: RRR.  There is no murmur, rub, or gallop. There  is trace to 1+ right lower extremity edema. Respiratory:  clear to auscultation bilaterally, normal work of breathing. GI: soft, nontender, nondistended, + BS MS: no deformity or atrophy Skin: warm and dry, no rash Neuro:  Strength and sensation are intact Psych: euthymic mood, full affect   EKG:  EKG is ordered today. The ekg reveals normal sinus rhythm, left bundle branch block, and no change compared to prior tracings.   Recent Labs: 05/26/2014: Platelets 330.0 01/20/2015: ALT 12; BUN 84*; Creatinine 6.6*; Hemoglobin 11.0*; Potassium 4.9; Sodium 135*    Lipid Panel    Component Value Date/Time   CHOL 157 04/30/2014   TRIG 82 04/30/2014   HDL  39 04/30/2014   CHOLHDL 4.7 09/20/2011 0822   VLDL 24 09/20/2011 0822   LDLCALC 102 04/30/2014      Wt Readings from Last 3 Encounters:  03/01/15 77.111 kg (170 lb)  02/24/15 74.844 kg (165 lb)  01/12/15 73.483 kg (162 lb)      Other studies Reviewed: Additional studies/ records that were reviewed today include: The last hospital stay. The prior angiogram from 2011.. The findings include undilated but mid right coronary stenosis that if treated will require attention to CAD catheter support and may need rotational atherectomy..    ASSESSMENT AND PLAN:  1. Chronic combined systolic and diastolic congestive heart failure No evidence of volume overload. Current LV function is not known. - Echocardiogram; Future  2. Coronary artery disease involving native coronary artery of native heart without angina pectoris Asymptomatic  3. Atherosclerotic peripheral vascular disease with ulceration Left AKA. No right lower extremity complaints.  4. End-stage renal disease on hemodialysis Had dialysis earlier today and is stable.  5. CAD in native artery As above - EKG 12-Lead    Current medicines are reviewed at length with the patient today.  The patient has the following concerns regarding medicines: None.  The following  changes/actions have been instituted:    2-D Doppler echocardiogram to reassess LV function  No additional evaluation. No ischemic evaluation unless angina.  Labs/ tests ordered today include:   Orders Placed This Encounter  Procedures  . EKG 12-Lead  . Echocardiogram     Disposition:   FU with HS in 1 year  Signed, Sinclair Grooms, MD  03/01/2015 1:07 PM    [facility] [place], [place],   [address] Phone: [phone]; Fax: [phone]

## 2015-03-01 ENCOUNTER — Encounter: Payer: Self-pay | Admitting: Interventional Cardiology

## 2015-03-01 ENCOUNTER — Ambulatory Visit (INDEPENDENT_AMBULATORY_CARE_PROVIDER_SITE_OTHER): Payer: Medicare Other | Admitting: Interventional Cardiology

## 2015-03-01 VITALS — HR 87 | Ht 65.0 in | Wt 170.0 lb

## 2015-03-01 DIAGNOSIS — I2510 Atherosclerotic heart disease of native coronary artery without angina pectoris: Secondary | ICD-10-CM

## 2015-03-01 DIAGNOSIS — N186 End stage renal disease: Secondary | ICD-10-CM | POA: Diagnosis not present

## 2015-03-01 DIAGNOSIS — Z992 Dependence on renal dialysis: Secondary | ICD-10-CM

## 2015-03-01 DIAGNOSIS — L98499 Non-pressure chronic ulcer of skin of other sites with unspecified severity: Secondary | ICD-10-CM

## 2015-03-01 DIAGNOSIS — I739 Peripheral vascular disease, unspecified: Secondary | ICD-10-CM

## 2015-03-01 DIAGNOSIS — I5042 Chronic combined systolic (congestive) and diastolic (congestive) heart failure: Secondary | ICD-10-CM | POA: Diagnosis not present

## 2015-03-01 DIAGNOSIS — I70209 Unspecified atherosclerosis of native arteries of extremities, unspecified extremity: Secondary | ICD-10-CM

## 2015-03-01 NOTE — Patient Instructions (Signed)
Medication Instructions:  Your physician recommends that you continue on your current medications as directed. Please refer to the Current Medication list given to you today.   Labwork: None ordered  Testing/Procedures: Your physician has requested that you have an echocardiogram. Echocardiography is a painless test that uses sound waves to create images of your heart. It provides your doctor with information about the size and shape of your heart and how well your heart's chambers and valves are working. This procedure takes approximately one hour. There are no restrictions for this procedure.   Follow-Up: Your physician wants you to follow-up in: 1 year with Dr.Smith You will receive a reminder letter in the mail two months in advance. If you don't receive a letter, please call our office to schedule the follow-up appointment.   Any Other Special Instructions Will Be Listed Below (If Applicable).

## 2015-03-09 ENCOUNTER — Ambulatory Visit (HOSPITAL_COMMUNITY): Payer: Medicare Other | Attending: Interventional Cardiology

## 2015-03-09 ENCOUNTER — Other Ambulatory Visit (HOSPITAL_COMMUNITY): Payer: Medicare Other | Admitting: *Deleted

## 2015-03-09 DIAGNOSIS — Z89619 Acquired absence of unspecified leg above knee: Secondary | ICD-10-CM | POA: Insufficient documentation

## 2015-03-09 DIAGNOSIS — N186 End stage renal disease: Secondary | ICD-10-CM | POA: Insufficient documentation

## 2015-03-09 DIAGNOSIS — I739 Peripheral vascular disease, unspecified: Secondary | ICD-10-CM | POA: Insufficient documentation

## 2015-03-09 DIAGNOSIS — I120 Hypertensive chronic kidney disease with stage 5 chronic kidney disease or end stage renal disease: Secondary | ICD-10-CM | POA: Insufficient documentation

## 2015-03-09 DIAGNOSIS — E785 Hyperlipidemia, unspecified: Secondary | ICD-10-CM | POA: Diagnosis not present

## 2015-03-09 DIAGNOSIS — I5042 Chronic combined systolic (congestive) and diastolic (congestive) heart failure: Secondary | ICD-10-CM | POA: Diagnosis present

## 2015-03-09 DIAGNOSIS — I2510 Atherosclerotic heart disease of native coronary artery without angina pectoris: Secondary | ICD-10-CM | POA: Insufficient documentation

## 2015-03-09 DIAGNOSIS — Z87891 Personal history of nicotine dependence: Secondary | ICD-10-CM | POA: Diagnosis not present

## 2015-03-09 DIAGNOSIS — E1122 Type 2 diabetes mellitus with diabetic chronic kidney disease: Secondary | ICD-10-CM | POA: Insufficient documentation

## 2015-03-09 DIAGNOSIS — I350 Nonrheumatic aortic (valve) stenosis: Secondary | ICD-10-CM | POA: Insufficient documentation

## 2015-03-09 DIAGNOSIS — I252 Old myocardial infarction: Secondary | ICD-10-CM | POA: Diagnosis not present

## 2015-03-09 DIAGNOSIS — E213 Hyperparathyroidism, unspecified: Secondary | ICD-10-CM | POA: Insufficient documentation

## 2015-03-09 MED ORDER — PERFLUTREN LIPID MICROSPHERE
1.0000 mL | INTRAVENOUS | Status: AC | PRN
  Administered 2015-03-09: 1 mL via INTRAVENOUS

## 2015-03-11 ENCOUNTER — Telehealth: Payer: Self-pay

## 2015-03-11 NOTE — Telephone Encounter (Signed)
-----   Message from Belva Crome, MD sent at 03/09/2015  7:36 PM EDT ----- Heart size is normal. Overall pumping action of the heart is moderately decreased. These usually represents an ejection fraction in the 35-40% range. This will serve as our baseline going forward.

## 2015-03-11 NOTE — Telephone Encounter (Signed)
Results reported to pt nurse @ [facility]. [facility] [employer] # listed as pt home #. Heart size is normal. Overall pumping action of the heart is moderately decreased. These usually represents an ejection fraction in the 35-40% range. This will serve as our baseline going forward.

## 2015-03-25 ENCOUNTER — Non-Acute Institutional Stay (SKILLED_NURSING_FACILITY): Payer: Medicare Other | Admitting: Internal Medicine

## 2015-03-25 ENCOUNTER — Encounter: Payer: Self-pay | Admitting: Internal Medicine

## 2015-03-25 DIAGNOSIS — E1122 Type 2 diabetes mellitus with diabetic chronic kidney disease: Secondary | ICD-10-CM | POA: Diagnosis not present

## 2015-03-25 DIAGNOSIS — I70249 Atherosclerosis of native arteries of left leg with ulceration of unspecified site: Secondary | ICD-10-CM | POA: Diagnosis not present

## 2015-03-25 DIAGNOSIS — N186 End stage renal disease: Secondary | ICD-10-CM | POA: Diagnosis not present

## 2015-03-25 DIAGNOSIS — I2510 Atherosclerotic heart disease of native coronary artery without angina pectoris: Secondary | ICD-10-CM

## 2015-03-25 DIAGNOSIS — Z992 Dependence on renal dialysis: Secondary | ICD-10-CM

## 2015-03-25 DIAGNOSIS — N2581 Secondary hyperparathyroidism of renal origin: Secondary | ICD-10-CM | POA: Diagnosis not present

## 2015-03-25 NOTE — Progress Notes (Signed)
Patient ID: SIDNEY KANN, male   DOB: [birth year], 79 y.o.   MRN: [id number]    Facility: [facility]   Chief Complaint  Patient presents with  . Medical Management of Chronic Issues    Routine Visit    Allergies  Allergen Reactions  . Benazepril Hcl Hives, Itching and Rash    flareups on patient head   Code status: full code  HPI 79 y/o male patient is seen today for routine visit. He has been at his baseline with no new concerns this visit. He has PMH of DM2, ESRD on HD, PVD s/p Left AKA, hypotension, HLD, anemia, DM2, depression among others. He goes for dialysis 3 days a week. Compliant with his medication. No new skin concerns or falls reported  Review of Systems  Constitutional: Negative for fever, chills, diaphoresis.  HENT: Negative for congestion, hearing loss and sore throat.   Eyes: Negative for blurred vision, double vision and discharge.  Respiratory: Negative for cough, sputum production, shortness of breath and wheezing.   Cardiovascular: Negative for chest pain, palpitations.  Gastrointestinal: Negative for heartburn, nausea, vomiting, abdominal pain Genitourinary: anuric Musculoskeletal: Negative for back pain or falls.  Skin: Negative for itching and rash. has calciphylaxis Neurological: Negative for dizziness, tingling and headaches.    Past Medical History  Diagnosis Date  . Hyperparathyroidism   . Arthritis   . Hyperlipidemia   . Hypertension   . Neuromuscular disorder ([place])   . Stroke ([place])   . GERD (gastroesophageal reflux disease)   . Migraine   . Heart attack ([place]) 2011; 1990's  . Peripheral neuropathy ([place])   . Anxiety   . Depression   . Dialysis patient [facility])     M, W, F; [place] CAD (coronary artery disease)     a. s/p NSTEMI and failed RCA PCI in 2011. b. Nuc 12/2013: nonischemic, prior inferior MI.  Marland Kitchen LBBB (left bundle branch block)   . Calciphylaxis     a. Multiple admissions for nonhealing  L ulcer/sepsis, s/p L AKA 02/02/2014.  Marland Kitchen Chronic anemia   . Dementia   . ESRD (end stage renal disease) on dialysis ([place])   . Renal insufficiency   . Generalized weakness   . Hypotension     renovascular  . History of left bundle branch block   . Type II diabetes mellitus ([place])     not taking any medications.     Medication List       This list is accurate as of: 03/25/15 10:20 AM.  Always use your most recent med list.               acetaminophen 325 MG tablet  Commonly known as:  TYLENOL  Take 650 mg by mouth every 6 (six) hours as needed for mild pain or headache (DO NOT EXCEED 4 GM OF APAP IN 24 HOURS FORM ALL SOURCES).     aspirin EC 81 MG tablet  Take 81 mg by mouth every morning.     atorvastatin 20 MG tablet  Commonly known as:  LIPITOR  Take 20 mg by mouth at bedtime.     b-complex w/C Tabs tablet  Take 1 tablet by mouth at bedtime.     collagenase ointment  Commonly known as:  SANTYL  Apply 1 application topically daily. Apply to right lower leg wound     feeding supplement (PRO-STAT SUGAR FREE 64) Liqd  Take 30 mLs by mouth 2 (two)  times daily between meals.     gabapentin 100 MG capsule  Commonly known as:  NEURONTIN  Take 100 mg by mouth at bedtime.     HYDROcodone-acetaminophen 10-325 MG tablet  Commonly known as:  NORCO  Take one tablet by mouth every 6 hours as needed for moderate pain. Do not exceed 4gm of Tylenol in 24 hours     midodrine 5 MG tablet  Commonly known as:  PROAMATINE  Take 5 mg by mouth 3 (three) times daily. Do not take 6 am dose on dialysis days.     sevelamer carbonate 800 MG tablet  Commonly known as:  RENVELA  Take 1,600 mg by mouth 3 (three) times daily with meals.        Physical exam BP 116/58 mmHg  Pulse 62  Temp(Src) 97.2 F (36.2 C) (Oral)  Resp 20  SpO2 98%  Wt Readings from Last 3 Encounters:  03/01/15 170 lb (77.111 kg)  02/24/15 165 lb (74.844 kg)  01/12/15 162 lb (73.483 kg)   General- elderly  male in no acute distress Head- atraumatic, normocephalic Eyes- PERRLA, EOMI, no pallor, no icterus Neck- no lymphadenopathy, no thyromegaly, no jugular vein distension Chest- no chest wall deformities, no chest wall tenderness Cardiovascular- normal s1,s2, no murmurs Respiratory- bilateral clear to auscultation, no wheeze, no rhonchi, no crackles Abdomen- bowel sounds present, soft, non tender Musculoskeletal- able to move all 4 extremities, left AKA with prosthetic leg Neurological- no focal deficit, alert and oriented to person, place and time Psychiatry- normal mood and affect Skin- calciphylaxis present in right leg, left arm AV fistula  Labs CBC Latest Ref Rng 01/20/2015 08/26/2014 07/29/2014  WBC 4.0 - 10.5 K/uL - - -  Hemoglobin 13.5 - 17.5 g/dL 11.0(A) 11.1(A) 12.4(A)  Hematocrit 41 - 53 % 34(A) 35(A) 41  Platelets 150.0 - 400.0 K/uL - - -   CMP Latest Ref Rng 01/20/2015 08/05/2014 05/23/2014  Glucose 70 - 99 mg/dL - - -  BUN 4 - 21 mg/dL 84(A) - 26(A)  Creatinine 0.6 - 1.3 mg/dL 6.6(A) - 3.7(A)  Sodium 137 - 147 mmol/L 135(A) 136(A) 135(A)  Potassium 3.4 - 5.3 mmol/L 4.9 4.6 3.8  Chloride 96 - 112 mEq/L - - -  CO2 19 - 32 mEq/L - - -  Calcium 8.4 - 10.5 mg/dL - - -  Total Protein 6.0 - 8.3 g/dL - - -  Total Bilirubin 0.3 - 1.2 mg/dL - - -  Alkaline Phos 25 - 125 U/L 187(A) - -  AST 14 - 40 U/L 11(A) - -  ALT 10 - 40 U/L 12 - -     Assessment/plan  ESRD Continue HD 3 days a week, continue renvela for now   PAD With ulceration and now s/p left AKA. Continue aspirin and gabapentin 100 mg daily  Dm Diet controlled at present. Continue aspirin and statin. bp reading stable Lab Results  Component Value Date   HGBA1C 6.5* 01/20/2015   Secondary hyperparathyroidism Continue renvela for now, followed by renal team  CAD Remains chets pain free, continue statin and aspirin for now    [facility], MD  [place] (Monday-Friday 8 am -  5 pm) [phone] (afterhours)

## 2015-04-06 ENCOUNTER — Encounter: Payer: Self-pay | Admitting: *Deleted

## 2015-04-26 ENCOUNTER — Ambulatory Visit: Payer: Medicare Other | Admitting: Internal Medicine

## 2015-05-10 ENCOUNTER — Non-Acute Institutional Stay (SKILLED_NURSING_FACILITY): Payer: Medicare Other | Admitting: Nurse Practitioner

## 2015-05-10 DIAGNOSIS — M792 Neuralgia and neuritis, unspecified: Secondary | ICD-10-CM

## 2015-05-10 DIAGNOSIS — Z992 Dependence on renal dialysis: Secondary | ICD-10-CM

## 2015-05-10 DIAGNOSIS — I2510 Atherosclerotic heart disease of native coronary artery without angina pectoris: Secondary | ICD-10-CM | POA: Diagnosis not present

## 2015-05-10 DIAGNOSIS — E785 Hyperlipidemia, unspecified: Secondary | ICD-10-CM | POA: Diagnosis not present

## 2015-05-10 DIAGNOSIS — I953 Hypotension of hemodialysis: Secondary | ICD-10-CM

## 2015-05-10 DIAGNOSIS — N186 End stage renal disease: Secondary | ICD-10-CM

## 2015-05-10 DIAGNOSIS — E1122 Type 2 diabetes mellitus with diabetic chronic kidney disease: Secondary | ICD-10-CM

## 2015-05-10 NOTE — Progress Notes (Signed)
Patient ID: Sean [name], male   DOB: May 15, 1936, 79 y.o.   MRN: [id number]    Nursing Home Location:  [place] of Service: SNF (31)  PCP: No primary care provider on file.  Allergies  Allergen Reactions  . Benazepril Hcl Hives, Itching and Rash    flareups on patient head    Chief Complaint  Patient presents with  . Medical Management of Chronic Issues    HPI:  Patient is a 79 y.o. male seen today at [facility] for medical management of his chronic issues.  He has a past medical history of DM, ESRD, hypotension, HLD, anemia and depression.  Pt reports decrease strength in bilateral hands. Reports this is not new but getting worse. Also has neuropathy in these hands. Doing exercise to hand without good effects. conts going to dialysis. Denies changes in appetite or mood. Reports he is doing well but does not care to be in a facility, too many rules in regards to diet and what he is doing. Staff has no concerns at this time.  Review of Systems:  Review of Systems  Constitutional: Negative for fever, chills and fatigue.  HENT: Negative.   Respiratory: Negative for cough, shortness of breath and wheezing.   Cardiovascular: Negative for chest pain, palpitations and leg swelling.  Gastrointestinal: Negative for abdominal pain, diarrhea and constipation.  Musculoskeletal: Negative for joint swelling and arthralgias.  Skin: Negative.   Psychiatric/Behavioral: Negative.     Past Medical History  Diagnosis Date  . Hyperparathyroidism   . Arthritis   . Hyperlipidemia   . Hypertension   . Neuromuscular disorder ([place])   . Stroke ([place])   . GERD (gastroesophageal reflux disease)   . Migraine   . Heart attack ([place]) 2011; 1990's  . Peripheral neuropathy ([place])   . Anxiety   . Depression   . Dialysis patient [facility])     M, W, F; [place] CAD (coronary artery disease)     a. s/p NSTEMI and failed RCA PCI in 2011. b. Nuc  12/2013: nonischemic, prior inferior MI.  Marland Kitchen LBBB (left bundle branch block)   . Calciphylaxis     a. Multiple admissions for nonhealing L ulcer/sepsis, s/p L AKA 02/02/2014.  Marland Kitchen Chronic anemia   . Dementia   . ESRD (end stage renal disease) on dialysis ([place])   . Renal insufficiency   . Generalized weakness   . Hypotension     renovascular  . History of left bundle branch block   . Type II diabetes mellitus ([place])     not taking any medications.  . Cholelithiasis   . Hepatic steatosis    Past Surgical History  Procedure Laterality Date  . Parathyroidectomy  2011  . Capd insertion  09/12/2011    Procedure: LAPAROSCOPIC INSERTION CONTINUOUS AMBULATORY PERITONEAL DIALYSIS  (CAPD) CATHETER;  Surgeon: Edward Jolly, MD;  Location: [place];  Service: General;  Laterality: N/A;  . Sp av dialysis shunt access existing *l*  2003  . Dg av dialysis shunt intro ndl*r* or      "not working" (09/19/11)  . Capd removal  03/12/2012    Procedure: CONTINUOUS AMBULATORY PERITONEAL DIALYSIS  (CAPD) CATHETER REMOVAL;  Surgeon: Edward Jolly, MD;  Location: [place];  Service: General;  Laterality: N/A;  . Amputation Left 02/02/2014    Procedure: AMPUTATION ABOVE KNEE-LEFT;  Surgeon: Elam Dutch, MD;  Location: [place];  Service:  Vascular;  Laterality: Left;  . I&d extremity Left 03/31/2014    Procedure: IRRIGATION AND DEBRIDEMENT EXTREMITY - LEFT ABOVE KNEE AMPUTATION;  Surgeon: Elam Dutch, MD;  Location: [place];  Service: Vascular;  Laterality: Left;  . Application of wound vac Left 03/31/2014    Procedure: APPLICATION OF WOUND VAC - LEFT AKA;  Surgeon: Elam Dutch, MD;  Location: [place];  Service: Vascular;  Laterality: Left;  . Colon surgery  2011    colon resection   . Colonoscopy with propofol N/A 07/14/2014    Procedure: COLONOSCOPY WITH PROPOFOL;  Surgeon: Jerene Bears, MD;  Location: [facility];  Service: Gastroenterology;  Laterality: N/A;   Social History:   reports that he quit  smoking about 38 years ago. His smoking use included Cigarettes. He has a 1.75 pack-year smoking history. He has never used smokeless tobacco. He reports that he does not drink alcohol or use illicit drugs.  Family History  Problem Relation Age of Onset  . Cancer Sister     breast  . Cancer Brother     lung  . Anesthesia problems Neg Hx   . Hypotension Neg Hx   . Malignant hyperthermia Neg Hx   . Pseudochol deficiency Neg Hx     Medications: Patient's Medications  New Prescriptions   No medications on file  Previous Medications   ACETAMINOPHEN (TYLENOL) 325 MG TABLET    Take 650 mg by mouth every 6 (six) hours as needed for mild pain or headache (DO NOT EXCEED 4 GM OF APAP IN 24 HOURS FORM ALL SOURCES).   AMINO ACIDS-PROTEIN HYDROLYS (FEEDING SUPPLEMENT, PRO-STAT SUGAR FREE 64,) LIQD    Take 30 mLs by mouth 2 (two) times daily between meals.   ASPIRIN EC 81 MG TABLET    Take 81 mg by mouth every morning.    ATORVASTATIN (LIPITOR) 20 MG TABLET    Take 20 mg by mouth at bedtime.   B-COMPLEX W/C (TOTAL B/C) TABS TABLET    Take 1 tablet by mouth at bedtime.   COLLAGENASE (SANTYL) OINTMENT    Apply 1 application topically daily. Apply to right lower leg wound   GABAPENTIN (NEURONTIN) 100 MG CAPSULE    Take 100 mg by mouth at bedtime.    HYDROCODONE-ACETAMINOPHEN (NORCO) 10-325 MG PER TABLET    Take one tablet by mouth every 6 hours as needed for moderate pain. Do not exceed 4gm of Tylenol in 24 hours   MIDODRINE (PROAMATINE) 5 MG TABLET    Take 5 mg by mouth 3 (three) times daily. Do not take 6 am dose on dialysis days.   SEVELAMER CARBONATE (RENVELA) 800 MG TABLET    Take 1,600 mg by mouth 3 (three) times daily with meals.  Modified Medications   No medications on file  Discontinued Medications   No medications on file     Physical Exam: Filed Vitals:   05/10/15 1540  BP: 151/74  Pulse: 85  Temp: 97.7 F (36.5 C)  Resp: 20  Weight: 170 lb (77.111 kg)    Physical Exam    Constitutional: He is oriented to person, place, and time. He appears well-developed and well-nourished.  HENT:  Head: Normocephalic and atraumatic.  Eyes: Pupils are equal, round, and reactive to light.  Neck: Normal range of motion. Neck supple.  Cardiovascular: Normal rate, regular rhythm and normal heart sounds.   Pulmonary/Chest: Effort normal and breath sounds normal.  Abdominal: Soft. Bowel sounds are normal.  Musculoskeletal: Normal range  of motion.  Lymphadenopathy:    He has no cervical adenopathy.  Neurological: He is alert and oriented to person, place, and time.  Skin: Skin is warm and dry.  Psychiatric: He has a normal mood and affect.    Labs reviewed: Basic Metabolic Panel:  Recent Labs  05/23/14 08/05/14 01/20/15 1031  NA 135* 136* 135*  K 3.8 4.6 4.9  BUN 26*  --  84*  CREATININE 3.7*  --  6.6*   Liver Function Tests:  Recent Labs  01/20/15 1031  AST 11*  ALT 12  ALKPHOS 187*   No results for input(s): LIPASE, AMYLASE in the last 8760 hours. No results for input(s): AMMONIA in the last 8760 hours. CBC:  Recent Labs  05/23/14 05/26/14 1134 07/29/14 08/26/14 01/20/15 1031  WBC 4.8 5.1  --   --   --   NEUTROABS  --  3.2  --   --   --   HGB 8.8* 9.5* 12.4* 11.1* 11.0*  HCT 29* 30.8* 41 35* 34*  MCV  --  81.2  --   --   --   PLT 288 330.0  --   --   --    TSH: No results for input(s): TSH in the last 8760 hours. A1C: Lab Results  Component Value Date   HGBA1C 6.5* 01/20/2015   Lipid Panel: No results for input(s): CHOL, HDL, LDLCALC, TRIG, CHOLHDL, LDLDIRECT in the last 8760 hours.  Assessment/Plan 1. Neuropathic pain Pt reports overall worsening neuropathy, maintained currently on gabapentin 100 mg at bedtime. Will get nerve conduction studies of bilateral upper extremities due to worsening neuropathy and decrease strength.  -will also get cbc with b12 and folate  2. Type 2 diabetes mellitus with chronic kidney disease on chronic  dialysis, without long-term current use of insulin (HCC) Not currently on medication, A1c stable in July at 6.5, will follow up at this time  3. Coronary artery disease involving native coronary artery of native heart without angina pectoris Without chest pains, conts to follow with cardiology. conts on asa 81 mg daily   4. Dyslipidemia -conts on zocor, will follow up lipid panel   5.  Hemodialysis-associated hypotension Stable.  Blood pressures are stable, conts on Midodrine 5 mg TID.   6. End stage renal disease on dialysis ([place]) Stable, conts on HD M,W, F  Jodelle Fausto K. Harle Battiest  [facility] [phone] 8 am - 5 pm) [phone] (after hours)

## 2015-05-11 ENCOUNTER — Other Ambulatory Visit: Payer: Self-pay | Admitting: *Deleted

## 2015-05-11 MED ORDER — HYDROCODONE-ACETAMINOPHEN 10-325 MG PO TABS: ORAL_TABLET | ORAL | Status: DC

## 2015-05-11 NOTE — Telephone Encounter (Signed)
[facility] 

## 2015-06-18 ENCOUNTER — Ambulatory Visit: Payer: Medicare Other | Admitting: Internal Medicine

## 2015-06-29 ENCOUNTER — Non-Acute Institutional Stay (SKILLED_NURSING_FACILITY): Payer: Medicare Other | Admitting: Internal Medicine

## 2015-06-29 DIAGNOSIS — F329 Major depressive disorder, single episode, unspecified: Secondary | ICD-10-CM

## 2015-06-29 DIAGNOSIS — E1122 Type 2 diabetes mellitus with diabetic chronic kidney disease: Secondary | ICD-10-CM

## 2015-06-29 DIAGNOSIS — M792 Neuralgia and neuritis, unspecified: Secondary | ICD-10-CM | POA: Diagnosis not present

## 2015-06-29 DIAGNOSIS — Z992 Dependence on renal dialysis: Secondary | ICD-10-CM

## 2015-06-29 DIAGNOSIS — F32A Depression, unspecified: Secondary | ICD-10-CM

## 2015-06-29 DIAGNOSIS — N186 End stage renal disease: Secondary | ICD-10-CM | POA: Diagnosis not present

## 2015-06-29 NOTE — Progress Notes (Signed)
Patient ID: Sean [name], male   DOB: 07-13-1935, 79 y.o.   MRN: [id number]     PCP: No primary care provider on file.  Code Status:Full code   Goals of care:  Advanced Directive information Advanced Directives 07/07/2014  Does patient have an advance directive? No  Type of Advance Directive -  Does patient want to make changes to advanced directive? -  Copy of advanced directive(s) in chart? -  Would patient like information on creating an advanced directive? -  Pre-existing out of facility DNR order (yellow form or pink MOST form) -     Allergies  Allergen Reactions  . Benazepril Hcl Hives, Itching and Rash    flareups on patient head    Chief Complaint  Patient presents with  . Medical Management of Chronic Issues    HPI:  80 y.o. male  Seen today at [facility] for medical management of his chronic issues. He has a past medical history of Type 2 DM, ESRD, Depression and Neuropathic pain. Patient reports pain on both hands but has been exercising with the physical Therapist with some relief. He continues with dialysis on Mon, Wed and Fri. He states tries to be independent as much as possible. He denies any acute issues this visit. Facility staff reports no issues.    Review of Systems  Constitutional: Negative.   HENT: Negative.   Eyes: Negative.   Respiratory: Negative.   Cardiovascular: Negative.   Gastrointestinal: Negative.   Musculoskeletal: Negative.   Skin: Negative.   Endo/Heme/Allergies: Negative.   Psychiatric/Behavioral: Negative.      Past Medical History  Diagnosis Date  . Hyperparathyroidism   . Arthritis   . Hyperlipidemia   . Hypertension   . Neuromuscular disorder ([place])   . Stroke ([place])   . GERD (gastroesophageal reflux disease)   . Migraine   . Heart attack ([place]) 2011; 1990's  . Peripheral neuropathy ([place])   . Anxiety   . Depression   . Dialysis patient [facility])     M, W, F; [place] CAD (coronary artery  disease)     a. s/p NSTEMI and failed RCA PCI in 2011. b. Nuc 12/2013: nonischemic, prior inferior MI.  Marland Kitchen LBBB (left bundle branch block)   . Calciphylaxis     a. Multiple admissions for nonhealing L ulcer/sepsis, s/p L AKA 02/02/2014.  Marland Kitchen Chronic anemia   . Dementia   . ESRD (end stage renal disease) on dialysis ([place])   . Renal insufficiency   . Generalized weakness   . Hypotension     renovascular  . History of left bundle branch block   . Type II diabetes mellitus ([place])     not taking any medications.  . Cholelithiasis   . Hepatic steatosis     Allergies  Allergen Reactions  . Benazepril Hcl Hives, Itching and Rash    flareups on patient head    Medications:   Medication List       This list is accurate as of: 06/29/15  4:40 PM.  Always use your most recent med list.               acetaminophen 325 MG tablet  Commonly known as:  TYLENOL  Take 650 mg by mouth every 6 (six) hours as needed for mild pain or headache (DO NOT EXCEED 4 GM OF APAP IN 24 HOURS FORM ALL SOURCES).     aspirin EC 81 MG tablet  Take  81 mg by mouth every morning.     atorvastatin 20 MG tablet  Commonly known as:  LIPITOR  Take 20 mg by mouth at bedtime.     b-complex w/C Tabs tablet  Take 1 tablet by mouth at bedtime.     collagenase ointment  Commonly known as:  SANTYL  Apply 1 application topically daily. Reported on 06/29/2015     feeding supplement (PRO-STAT SUGAR FREE 64) Liqd  Take 30 mLs by mouth 2 (two) times daily between meals.     gabapentin 100 MG capsule  Commonly known as:  NEURONTIN  Take 100 mg by mouth at bedtime.     HYDROcodone-acetaminophen 10-325 MG tablet  Commonly known as:  NORCO  Take one tablet by mouth every 6 hours as needed for moderate pain. Do not exceed 4gm of Tylenol in 24 hours     midodrine 5 MG tablet  Commonly known as:  PROAMATINE  Take 5 mg by mouth 3 (three) times daily. Do not take 6 am dose on dialysis days.     sevelamer carbonate 800 MG  tablet  Commonly known as:  RENVELA  Take 1,600 mg by mouth 3 (three) times daily with meals.         Filed Vitals:   06/29/15 1630  BP: 133/73  Pulse: 88  Temp: 99.1 F (37.3 C)  Resp: 18  Weight: 173 lb 6.4 oz (78.654 kg)  SpO2: 97%   Body mass index is 28.86 kg/(m^2). Physical Exam  Constitutional: He is oriented to person, place, and time. He appears well-developed and well-nourished.  HENT:  Head: Normocephalic.  Eyes: Conjunctivae are normal.  Right pupil >left Reactive to light and accomodation.   Neck: Normal range of motion.  Cardiovascular: Normal rate and regular rhythm.   Pulmonary/Chest: Effort normal and breath sounds normal.  Abdominal: Soft. Bowel sounds are normal.  Musculoskeletal: He exhibits no edema.  Left AKA prosthetic in place.   Neurological: He is oriented to person, place, and time.  Skin: Skin is warm and dry.  Right lower extremity multiple old scars intact.   Psychiatric: He has a normal mood and affect.    Labs reviewed: Basic Metabolic Panel:  Recent Labs  08/05/14 01/20/15 1031  NA 136* 135*  K 4.6 4.9  BUN  --  84*  CREATININE  --  6.6*   Liver Function Tests:  Recent Labs  01/20/15 1031  AST 11*  ALT 12  ALKPHOS 187*   No results for input(s): LIPASE, AMYLASE in the last 8760 hours. No results for input(s): AMMONIA in the last 8760 hours. CBC:  Recent Labs  07/29/14 08/26/14 01/20/15 1031  HGB 12.4* 11.1* 11.0*  HCT 41 35* 34*   Cardiac Enzymes: No results for input(s): CKTOTAL, CKMB, CKMBINDEX, TROPONINI in the last 8760 hours. BNP: Invalid input(s): POCBNP CBG:  Recent Labs  07/14/14 0913  GLUCAP 77     Assessment/Plan:   1. Neuropathic pain Bilateral hands pain seems stable currently on Gabapentin 100 mg Capsule. Continue exercise with  PT.  2. Depression Stable.Coping well with condition and motivated with being independent as much as possible with own ADL's. No mood changes   3. Type 2  diabetes mellitus with chronic kidney disease on chronic dialysis, without long-term current use of insulin (HCC) Diet controlled. Currently no meds. Will recheck Hgb A1C with next labs.   4. End stage renal disease on dialysis ([place]) Left AV fistula intact positive for thrill and bruit. Continue  with dialysis on Mon,Wed and Fri.Has been non-compliant with fluid restrictions.   Future labs/tests needed:   Recheck CBC/diff, CMP, TSH and Hgb A1C in 1 months (07/2015).

## 2015-07-07 LAB — HEMOGLOBIN A1C: Hemoglobin A1C: 6.6

## 2015-07-21 ENCOUNTER — Non-Acute Institutional Stay (SKILLED_NURSING_FACILITY): Payer: Medicare Other | Admitting: Family

## 2015-07-21 DIAGNOSIS — H6123 Impacted cerumen, bilateral: Secondary | ICD-10-CM | POA: Diagnosis not present

## 2015-07-21 DIAGNOSIS — H6120 Impacted cerumen, unspecified ear: Secondary | ICD-10-CM | POA: Insufficient documentation

## 2015-07-21 NOTE — Progress Notes (Signed)
Patient ID: Sean [name], male   DOB: 1935/06/15, 80 y.o.   MRN: [id number]  Location: [facility]  Provider:  [name], MD   No primary care provider on file.  Code Status: Full Code  Goals of care: Advanced Directive information Advanced Directives 07/07/2014  Does patient have an advance directive? No  Type of Advance Directive -  Does patient want to make changes to advanced directive? -  Copy of advanced directive(s) in chart? -     Chief Complaint  Patient presents with  . Acute Visit    blocked ears with excessive wax.    HPI:  Pt is a 80 y.o. male seen today at [facility] for complains of blocked ears with excessive wax. He has a past medical history of Type 2 DM, ESRD, Depression and Neuropathic pain. He is seen today in his room. He states decreased hearing especially on the left ear. He states he used olive oil in the past to remove excessive wax. Denies ringing in the ears, cough or sore throat. Facility staff reports no concerns.     Review of Systems  Constitutional: Negative for fever and chills.  HENT: Negative for congestion, ear discharge, ear pain, sore throat and tinnitus.         decreased hearing to left ear.    Eyes: Negative.   Respiratory: Negative.   Cardiovascular: Negative.   Musculoskeletal: Negative.   Skin: Negative.   Neurological: Negative for dizziness, tingling, sensory change, loss of consciousness and headaches.  Psychiatric/Behavioral: Negative.     Past Medical History  Diagnosis Date  . Hyperparathyroidism   . Arthritis   . Hyperlipidemia   . Hypertension   . Neuromuscular disorder ([place])   . Stroke ([place])   . GERD (gastroesophageal reflux disease)   . Migraine   . Heart attack ([place]) 2011; 1990's  . Peripheral neuropathy ([place])   . Anxiety   . Depression   . Dialysis patient [facility])     M, W, F; [name] CAD (coronary artery disease)     a. s/p NSTEMI  and failed RCA PCI in 2011. b. Nuc 12/2013: nonischemic, prior inferior MI.  Marland Kitchen LBBB (left bundle branch block)   . Calciphylaxis     a. Multiple admissions for nonhealing L ulcer/sepsis, s/p L AKA 02/02/2014.  Marland Kitchen Chronic anemia   . Dementia   . ESRD (end stage renal disease) on dialysis ([place])   . Renal insufficiency   . Generalized weakness   . Hypotension     renovascular  . History of left bundle branch block   . Type II diabetes mellitus ([place])     not taking any medications.  . Cholelithiasis   . Hepatic steatosis    Past Surgical History  Procedure Laterality Date  . Parathyroidectomy  2011  . Capd insertion  09/12/2011    Procedure: LAPAROSCOPIC INSERTION CONTINUOUS AMBULATORY PERITONEAL DIALYSIS  (CAPD) CATHETER;  Surgeon: Edward Jolly, MD;  Location: [place];  Service: General;  Laterality: N/A;  . Sp av dialysis shunt access existing *l*  2003  . Dg av dialysis shunt intro ndl*r* or      "not working" (09/19/11)  . Capd removal  03/12/2012    Procedure: CONTINUOUS AMBULATORY PERITONEAL DIALYSIS  (CAPD) CATHETER REMOVAL;  Surgeon: Edward Jolly, MD;  Location: [place];  Service: General;  Laterality: N/A;  . Amputation Left 02/02/2014    Procedure: AMPUTATION ABOVE  KNEE-LEFT;  Surgeon: Elam Dutch, MD;  Location: [place];  Service: Vascular;  Laterality: Left;  . I&d extremity Left 03/31/2014    Procedure: IRRIGATION AND DEBRIDEMENT EXTREMITY - LEFT ABOVE KNEE AMPUTATION;  Surgeon: Elam Dutch, MD;  Location: [place];  Service: Vascular;  Laterality: Left;  . Application of wound vac Left 03/31/2014    Procedure: APPLICATION OF WOUND VAC - LEFT AKA;  Surgeon: Elam Dutch, MD;  Location: [place];  Service: Vascular;  Laterality: Left;  . Colon surgery  2011    colon resection   . Colonoscopy with propofol N/A 07/14/2014    Procedure: COLONOSCOPY WITH PROPOFOL;  Surgeon: Jerene Bears, MD;  Location: [facility];  Service: Gastroenterology;  Laterality: N/A;     Allergies  Allergen Reactions  . Benazepril Hcl Hives, Itching and Rash    flareups on patient head      Medication List       This list is accurate as of: 07/21/15  5:16 PM.  Always use your most recent med list.               acetaminophen 325 MG tablet  Commonly known as:  TYLENOL  Take 650 mg by mouth every 6 (six) hours as needed for mild pain or headache (DO NOT EXCEED 4 GM OF APAP IN 24 HOURS FORM ALL SOURCES).     aspirin EC 81 MG tablet  Take 81 mg by mouth every morning.     atorvastatin 20 MG tablet  Commonly known as:  LIPITOR  Take 20 mg by mouth at bedtime.     b-complex w/C Tabs tablet  Take 1 tablet by mouth at bedtime.     feeding supplement (PRO-STAT SUGAR FREE 64) Liqd  Take 30 mLs by mouth 2 (two) times daily between meals.     gabapentin 100 MG capsule  Commonly known as:  NEURONTIN  Take 100 mg by mouth at bedtime.     HYDROcodone-acetaminophen 10-325 MG tablet  Commonly known as:  NORCO  Take one tablet by mouth every 6 hours as needed for moderate pain. Do not exceed 4gm of Tylenol in 24 hours     midodrine 5 MG tablet  Commonly known as:  PROAMATINE  Take 5 mg by mouth 3 (three) times daily. Do not take 6 am dose on dialysis days.     sevelamer carbonate 800 MG tablet  Commonly known as:  RENVELA  Take 1,600 mg by mouth 3 (three) times daily with meals.        Immunization History  Administered Date(s) Administered  . Influenza-Unspecified 04/03/2014, 04/19/2015  . PPD Test 04/03/2014, 04/17/2014, 02/16/2015  . Pneumococcal Polysaccharide-23 02/08/2014  . Pneumococcal-Unspecified 04/03/2014   Pertinent  Health Maintenance Due  Topic Date Due  . FOOT EXAM  09/06/1945  . OPHTHALMOLOGY EXAM  09[birth year]06/1945  . URINE MICROALBUMIN  09[birth year]06/1945  . PNA vac Low Risk Adult (2 of 2 - PCV13) 04/04/2015  . HEMOGLOBIN A1C  07/23/2015  . INFLUENZA VACCINE  01/11/2016   No flowsheet data found.  Filed Vitals:   07/21/15 1712  BP: 132/76   Pulse: 72  Temp: 97.2 F (36.2 C)  Resp: 20  Weight: 171 lb (77.565 kg)  SpO2: 95%   Body mass index is 28.46 kg/(m^2). Physical Exam  Constitutional: He is oriented to person, place, and time. He appears well-developed and well-nourished.  In no acute distress  HENT:  Head: Normocephalic.  Both ears cerumen impaction  noted.   Neck: Normal range of motion.  Cardiovascular: Normal rate and regular rhythm.   Pulmonary/Chest: Effort normal and breath sounds normal.  Lymphadenopathy:    He has no cervical adenopathy.  Neurological: He is oriented to person, place, and time.  Skin: Skin is warm and dry.  Psychiatric: He has a normal mood and affect.    Labs reviewed:  Recent Labs  08/05/14 01/20/15 1031  NA 136* 135*  K 4.6 4.9  BUN  --  84*  CREATININE  --  6.6*    Recent Labs  01/20/15 1031  AST 11*  ALT 12  ALKPHOS 187*    Recent Labs  07/29/14 08/26/14 01/20/15 1031  HGB 12.4* 11.1* 11.0*  HCT 41 35* 34*   Lab Results  Component Value Date   TSH 1.050 12/21/2013   Lab Results  Component Value Date   HGBA1C 6.5* 01/20/2015   Lab Results  Component Value Date   CHOL 157 04/30/2014   HDL 39 04/30/2014   LDLCALC 102 04/30/2014   TRIG 82 04/30/2014   CHOLHDL 4.7 09/20/2011    Significant Diagnostic Results in last 30 days:  No results found.  Assessment/Plan Cerumen impaction, bilateral Bilateral cerumen impaction with decreased hearing on left ear.Will order Debrox 6.5% instill 5 drops to both ears twice daily X 4 days then facility staff to lavage. Follow if needed.      Family/ staff Communication: Reviewed plan of care with patient and facility Nurse supervisor.   Labs/tests ordered:  None

## 2015-07-28 ENCOUNTER — Non-Acute Institutional Stay (SKILLED_NURSING_FACILITY): Payer: Medicare Other | Admitting: Nurse Practitioner

## 2015-07-28 ENCOUNTER — Encounter: Payer: Self-pay | Admitting: Nurse Practitioner

## 2015-07-28 DIAGNOSIS — E785 Hyperlipidemia, unspecified: Secondary | ICD-10-CM | POA: Diagnosis not present

## 2015-07-28 DIAGNOSIS — I959 Hypotension, unspecified: Secondary | ICD-10-CM | POA: Diagnosis not present

## 2015-07-28 DIAGNOSIS — N186 End stage renal disease: Secondary | ICD-10-CM | POA: Diagnosis not present

## 2015-07-28 DIAGNOSIS — K5900 Constipation, unspecified: Secondary | ICD-10-CM

## 2015-07-28 DIAGNOSIS — M792 Neuralgia and neuritis, unspecified: Secondary | ICD-10-CM | POA: Diagnosis not present

## 2015-07-28 DIAGNOSIS — E1122 Type 2 diabetes mellitus with diabetic chronic kidney disease: Secondary | ICD-10-CM | POA: Diagnosis not present

## 2015-07-28 DIAGNOSIS — H6123 Impacted cerumen, bilateral: Secondary | ICD-10-CM | POA: Diagnosis not present

## 2015-07-28 DIAGNOSIS — Z992 Dependence on renal dialysis: Secondary | ICD-10-CM

## 2015-07-28 NOTE — Progress Notes (Signed)
Patient ID: Sean [name], male   DOB: [birth year], 80 y.o.   MRN: [id number]    Nursing Home Location:  [place] of Service: SNF (31)  PCP: No primary care provider on file.  Allergies  Allergen Reactions  . Benazepril Hcl Hives, Itching and Rash    flareups on patient head    Chief Complaint  Patient presents with  . Medical Management of Chronic Issues    Routine Visit    HPI:  Patient is a 80 y.o. male seen today at [facility] for medical management of his chronic issues.  He has a past medical history of DM, ESRD, hypotension, HLD, anemia and depression. Has been doing well. Just moved rooms and excited about the change. Reports he hopes to sleep better tonight without a roommate. Reports hands are getting stronger with OT. States ears are still stopped up- never got debrox.  Reports occasional constipation, was taking supplement that he is not taking at this time. No abdominal pain or changes in appetite.   Review of Systems:  Review of Systems  Constitutional: Negative for fever, chills and fatigue.  HENT: Negative.   Respiratory: Negative for cough, shortness of breath and wheezing.   Cardiovascular: Negative for chest pain, palpitations and leg swelling.  Gastrointestinal: Positive for constipation. Negative for abdominal pain and diarrhea.  Musculoskeletal: Negative for joint swelling and arthralgias.  Skin: Negative.   Psychiatric/Behavioral: Negative.     Past Medical History  Diagnosis Date  . Hyperparathyroidism   . Arthritis   . Hyperlipidemia   . Hypertension   . Neuromuscular disorder ([place])   . Stroke ([place])   . GERD (gastroesophageal reflux disease)   . Migraine   . Heart attack ([place]) 2011; 1990's  . Peripheral neuropathy ([place])   . Anxiety   . Depression   . Dialysis patient [facility])     M, W, F; [place] CAD (coronary artery disease)     a. s/p NSTEMI and failed RCA PCI in 2011. b. Nuc  12/2013: nonischemic, prior inferior MI.  Marland Kitchen LBBB (left bundle branch block)   . Calciphylaxis     a. Multiple admissions for nonhealing L ulcer/sepsis, s/p L AKA 02/02/2014.  Marland Kitchen Chronic anemia   . Dementia   . ESRD (end stage renal disease) on dialysis ([place])   . Renal insufficiency   . Generalized weakness   . Hypotension     renovascular  . History of left bundle branch block   . Type II diabetes mellitus ([place])     not taking any medications.  . Cholelithiasis   . Hepatic steatosis    Past Surgical History  Procedure Laterality Date  . Parathyroidectomy  2011  . Capd insertion  09/12/2011    Procedure: LAPAROSCOPIC INSERTION CONTINUOUS AMBULATORY PERITONEAL DIALYSIS  (CAPD) CATHETER;  Surgeon: Edward Jolly, MD;  Location: [place];  Service: General;  Laterality: N/A;  . Sp av dialysis shunt access existing *l*  2003  . Dg av dialysis shunt intro ndl*r* or      "not working" (09/19/11)  . Capd removal  03/12/2012    Procedure: CONTINUOUS AMBULATORY PERITONEAL DIALYSIS  (CAPD) CATHETER REMOVAL;  Surgeon: Edward Jolly, MD;  Location: [place];  Service: General;  Laterality: N/A;  . Amputation Left 02/02/2014    Procedure: AMPUTATION ABOVE KNEE-LEFT;  Surgeon: Elam Dutch, MD;  Location: [place];  Service: Vascular;  Laterality: Left;  .  I&d extremity Left 03/31/2014    Procedure: IRRIGATION AND DEBRIDEMENT EXTREMITY - LEFT ABOVE KNEE AMPUTATION;  Surgeon: Elam Dutch, MD;  Location: [place];  Service: Vascular;  Laterality: Left;  . Application of wound vac Left 03/31/2014    Procedure: APPLICATION OF WOUND VAC - LEFT AKA;  Surgeon: Elam Dutch, MD;  Location: [place];  Service: Vascular;  Laterality: Left;  . Colon surgery  2011    colon resection   . Colonoscopy with propofol N/A 07/14/2014    Procedure: COLONOSCOPY WITH PROPOFOL;  Surgeon: Jerene Bears, MD;  [facility];  Service: Gastroenterology;  Laterality: N/A;   Social History:   reports that he quit  smoking about 39 years ago. His smoking use included Cigarettes. He has a 1.75 pack-year smoking history. He has never used smokeless tobacco. He reports that he does not drink alcohol or use illicit drugs.  Family History  Problem Relation Age of Onset  . Cancer Sister     breast  . Cancer Brother     lung  . Anesthesia problems Neg Hx   . Hypotension Neg Hx   . Malignant hyperthermia Neg Hx   . Pseudochol deficiency Neg Hx     Medications: Patient's Medications  New Prescriptions   No medications on file  Previous Medications   ACETAMINOPHEN (TYLENOL) 325 MG TABLET    Take 650 mg by mouth every 6 (six) hours as needed for mild pain or headache (DO NOT EXCEED 4 GM OF APAP IN 24 HOURS FORM ALL SOURCES).   AMINO ACIDS-PROTEIN HYDROLYS (FEEDING SUPPLEMENT, PRO-STAT SUGAR FREE 64,) LIQD    Take 30 mLs by mouth 2 (two) times daily between meals.   ASPIRIN EC 81 MG TABLET    Take 81 mg by mouth every morning.    ATORVASTATIN (LIPITOR) 20 MG TABLET    Take 20 mg by mouth at bedtime.   B-COMPLEX W/C (TOTAL B/C) TABS TABLET    Take 1 tablet by mouth at bedtime.   GABAPENTIN (NEURONTIN) 100 MG CAPSULE    Take 100 mg by mouth at bedtime.    HYDROCODONE-ACETAMINOPHEN (NORCO) 10-325 MG TABLET    Take one tablet by mouth every 6 hours as needed for moderate pain. Do not exceed 4gm of Tylenol in 24 hours   MIDODRINE (PROAMATINE) 5 MG TABLET    Take 5 mg by mouth 3 (three) times daily. Do not take 6 am dose on dialysis days.   SEVELAMER CARBONATE (RENVELA) 800 MG TABLET    Take 1,600 mg by mouth 3 (three) times daily with meals.  Modified Medications   No medications on file  Discontinued Medications   No medications on file     Physical Exam: Filed Vitals:   07/28/15 1438  BP: 110/68  Pulse: 78  Temp: 97.8 F (36.6 C)  TempSrc: Oral  Resp: 20  Height: 5\' 5"  (1.651 m)  Weight: 171 lb (77.565 kg)    Physical Exam  Constitutional: He is oriented to person, place, and time. He appears  well-developed and well-nourished.  HENT:  Head: Normocephalic and atraumatic.  Eyes: Pupils are equal, round, and reactive to light.  Neck: Normal range of motion. Neck supple.  Cardiovascular: Normal rate, regular rhythm and normal heart sounds.   Left AV fistula intact   Pulmonary/Chest: Effort normal and breath sounds normal.  Abdominal: Soft. Bowel sounds are normal.  Musculoskeletal: Normal range of motion.  Left AKA  Lymphadenopathy:    He has  no cervical adenopathy.  Neurological: He is alert and oriented to person, place, and time.  Skin: Skin is warm and dry.  Psychiatric: He has a normal mood and affect.    Labs reviewed: Basic Metabolic Panel:  Recent Labs  08/05/14 10/07/14 12/23/14 01/20/15 1031  NA 136* 137  --  135*  K 4.6 5.5* 4.9 4.9  BUN  --   --  84* 84*  CREATININE  --   --  6.6* 6.6*   Liver Function Tests:  Recent Labs  12/23/14 01/20/15 1031  AST 12* 11*  ALT 11 12  ALKPHOS  --  187*   No results for input(s): LIPASE, AMYLASE in the last 8760 hours. No results for input(s): AMMONIA in the last 8760 hours. CBC:  Recent Labs  08/26/14 10/07/14 10/21/14 01/20/15 1031  WBC  --  4.3  --   --   HGB 11.1* 12.9* 12.7* 11.0*  HCT 35* 41  --  34*   TSH: No results for input(s): TSH in the last 8760 hours. A1C: Lab Results  Component Value Date   HGBA1C 6.5* 01/20/2015   Lipid Panel: No results for input(s): CHOL, HDL, LDLCALC, TRIG, CHOLHDL, LDLDIRECT in the last 8760 hours.  Ferritin: 10-07-14   1015 Iron:       10-07-14   59  Assessment/Plan 1. Neuropathic pain Remain stable on gabapentin, pt to cont to work with OT  2. Cerumen impaction, bilateral -pt reports debrox was not administered before and ears still stopped up. Debrox 5 drops into bilateral ears BID for 4 days then staff to flush bilaterally reordered  3. End stage renal disease on dialysis [facility]) -conts on HD Monday, Wednesday, Friday  -conts on renvela TID  4. Type 2  diabetes mellitus with chronic kidney disease on chronic dialysis, without long-term current use of insulin (HCC) A1c stable in august, not current on medications, Refuses labs to be done in facility, will send order over to dialysis for A1c at next blood draw  5. Dyslipidemia - no recent lipid panel on file, to obtain lab at dialysis, pt conts on lipitor daily  6. Hypotension, unspecified hypotension type -stable on midodrine 5 mg TID   7. Constipation -will start colace 100 mg daily   Lirio Bach K. Harle Battiest  [facility] [phone] 8 am - 5 pm) [phone] (after hours)

## 2015-08-18 LAB — BASIC METABOLIC PANEL: BUN: 55 mg/dL — AB (ref 4–21)

## 2015-08-19 ENCOUNTER — Non-Acute Institutional Stay (SKILLED_NURSING_FACILITY): Payer: Medicare Other | Admitting: Family

## 2015-08-19 DIAGNOSIS — I2510 Atherosclerotic heart disease of native coronary artery without angina pectoris: Secondary | ICD-10-CM

## 2015-08-19 DIAGNOSIS — I5042 Chronic combined systolic (congestive) and diastolic (congestive) heart failure: Secondary | ICD-10-CM | POA: Diagnosis not present

## 2015-08-19 DIAGNOSIS — E1122 Type 2 diabetes mellitus with diabetic chronic kidney disease: Secondary | ICD-10-CM | POA: Diagnosis not present

## 2015-08-19 DIAGNOSIS — Z992 Dependence on renal dialysis: Secondary | ICD-10-CM | POA: Diagnosis not present

## 2015-08-19 DIAGNOSIS — E785 Hyperlipidemia, unspecified: Secondary | ICD-10-CM

## 2015-08-19 DIAGNOSIS — N186 End stage renal disease: Secondary | ICD-10-CM | POA: Diagnosis not present

## 2015-08-19 DIAGNOSIS — M792 Neuralgia and neuritis, unspecified: Secondary | ICD-10-CM | POA: Diagnosis not present

## 2015-08-19 DIAGNOSIS — L97921 Non-pressure chronic ulcer of unspecified part of left lower leg limited to breakdown of skin: Secondary | ICD-10-CM

## 2015-08-19 NOTE — Progress Notes (Signed)
Patient ID: Sean [name], male   DOB: 1935/07/21, 80 y.o.   MRN: [id number]  Location:  [place] of Service:  SNF [phone]) Provider: [name], MD   No primary care provider on file.  No care team member to display  Extended Emergency Contact Information Primary Emergency Contact: [facility] Address: [address]          [place], [place] [address] [name] of [place] Phone: [phone] Relation: Daughter Secondary Emergency Contact: [facility] Address: [address]          [place], [place] [address] [name] Mobile Phone: [phone] Relation: Son  Code Status:  Full Code  Goals of care: Advanced Directive information Advanced Directives 07/28/2015  Does patient have an advance directive? No  Would patient like information on creating an advanced directive? No - patient declined information     Chief Complaint  Patient presents with  . Medical Management of Chronic Issues    HPI:  Pt is a 80 y.o. male seen today at [facility] for medical management of chronic diseases.He has a medical history of ESRD, HTN, Stroke, GERD, Hyperlipidemia, Hyperparathyroidism, Depression, anxiety, Neuropathy among others. He is seen today in his room. He denies any acute issues this visit. He continues with dialysis on Mon, Wed and Fri. Labs ordered 07/28/2015 to be drawn during dialysis but have not been drawn. Facility staff reports no acute issues.      Past Medical History  Diagnosis Date  . Hyperparathyroidism   . Arthritis   . Hyperlipidemia   . Hypertension   . Neuromuscular disorder ([place])   . Stroke ([place])   . GERD (gastroesophageal reflux disease)   . Migraine   . Heart attack ([place]) 2011; 1990's  . Peripheral neuropathy ([place])   . Anxiety   . Depression   . Dialysis patient [facility])     M, W, F; [place] CAD (coronary artery disease)     a. s/p NSTEMI and failed RCA PCI in  2011. b. Nuc 12/2013: nonischemic, prior inferior MI.  Marland Kitchen LBBB (left bundle branch block)   . Calciphylaxis     a. Multiple admissions for nonhealing L ulcer/sepsis, s/p L AKA 02/02/2014.  Marland Kitchen Chronic anemia   . Dementia   . ESRD (end stage renal disease) on dialysis ([place])   . Renal insufficiency   . Generalized weakness   . Hypotension     renovascular  . History of left bundle branch block   . Type II diabetes mellitus ([place])     not taking any medications.  . Cholelithiasis   . Hepatic steatosis    Past Surgical History  Procedure Laterality Date  . Parathyroidectomy  2011  . Capd insertion  09/12/2011    Procedure: LAPAROSCOPIC INSERTION CONTINUOUS AMBULATORY PERITONEAL DIALYSIS  (CAPD) CATHETER;  Surgeon: Edward Jolly, MD;  Location: [place];  Service: General;  Laterality: N/A;  . Sp av dialysis shunt access existing *l*  2003  . Dg av dialysis shunt intro ndl*r* or      "not working" (09/19/11)  . Capd removal  03/12/2012    Procedure: CONTINUOUS AMBULATORY PERITONEAL DIALYSIS  (CAPD) CATHETER REMOVAL;  Surgeon: Edward Jolly, MD;  Location: [place];  Service: General;  Laterality: N/A;  . Amputation Left 02/02/2014    Procedure: AMPUTATION ABOVE KNEE-LEFT;  Surgeon: Elam Dutch, MD;  Location: [place];  Service: Vascular;  Laterality:  Left;  . I&d extremity Left 03/31/2014    Procedure: IRRIGATION AND DEBRIDEMENT EXTREMITY - LEFT ABOVE KNEE AMPUTATION;  Surgeon: Elam Dutch, MD;  Location: [place];  Service: Vascular;  Laterality: Left;  . Application of wound vac Left 03/31/2014    Procedure: APPLICATION OF WOUND VAC - LEFT AKA;  Surgeon: Elam Dutch, MD;  Location: [place];  Service: Vascular;  Laterality: Left;  . Colon surgery  2011    colon resection   . Colonoscopy with propofol N/A 07/14/2014    Procedure: COLONOSCOPY WITH PROPOFOL;  Surgeon: Jerene Bears, MD;  [facility];  Service: Gastroenterology;  Laterality: N/A;    Allergies  Allergen  Reactions  . Benazepril Hcl Hives, Itching and Rash    flareups on patient head      Medication List       This list is accurate as of: 08/19/15  5:29 PM.  Always use your most recent med list.               acetaminophen 325 MG tablet  Commonly known as:  TYLENOL  Take 650 mg by mouth every 6 (six) hours as needed for mild pain or headache (DO NOT EXCEED 4 GM OF APAP IN 24 HOURS FORM ALL SOURCES).     aspirin EC 81 MG tablet  Take 81 mg by mouth every morning.     atorvastatin 20 MG tablet  Commonly known as:  LIPITOR  Take 20 mg by mouth at bedtime.     b-complex w/C Tabs tablet  Take 1 tablet by mouth at bedtime.     feeding supplement (PRO-STAT SUGAR FREE 64) Liqd  Take 30 mLs by mouth 2 (two) times daily between meals.     gabapentin 100 MG capsule  Commonly known as:  NEURONTIN  Take 100 mg by mouth at bedtime.     HYDROcodone-acetaminophen 10-325 MG tablet  Commonly known as:  NORCO  Take one tablet by mouth every 6 hours as needed for moderate pain. Do not exceed 4gm of Tylenol in 24 hours     midodrine 5 MG tablet  Commonly known as:  PROAMATINE  Take 5 mg by mouth 3 (three) times daily. Do not take 6 am dose on dialysis days.     sevelamer carbonate 800 MG tablet  Commonly known as:  RENVELA  Take 1,600 mg by mouth 3 (three) times daily with meals.        Review of Systems  Constitutional: Negative for fever, chills, activity change, appetite change and fatigue.  HENT: Negative.   Eyes: Negative.   Respiratory: Negative for cough, chest tightness, shortness of breath and wheezing.   Cardiovascular: Negative for chest pain, palpitations and leg swelling.  Gastrointestinal: Negative.   Genitourinary:       ESRD dialysis on Mon, Wed and Fri   Musculoskeletal: Positive for gait problem. Negative for back pain and joint swelling.  Skin: Negative.   Allergic/Immunologic: Negative.   Neurological: Negative.   Hematological: Negative.     Psychiatric/Behavioral: Negative.     Immunization History  Administered Date(s) Administered  . Influenza-Unspecified 04/03/2014, 04/19/2015  . PPD Test 04/03/2014, 04/17/2014, 02/16/2015  . Pneumococcal Polysaccharide-23 02/08/2014  . Pneumococcal-Unspecified 04/03/2014   Pertinent  Health Maintenance Due  Topic Date Due  . FOOT EXAM  09[birth year]06/1945  . OPHTHALMOLOGY EXAM  09[birth year]06/1945  . URINE MICROALBUMIN  09/06/1947  . HEMOGLOBIN A1C  07/23/2015  . PNA vac Low Risk Adult (2 of  2 - PCV13) 08/09/2016 (Originally 04/04/2015)  . INFLUENZA VACCINE  01/11/2016   No flowsheet data found. Functional Status Survey:    Filed Vitals:   08/19/15 1711  BP: 138/78  Pulse: 68  Temp: 98 F (36.7 C)  Resp: 20  Weight: 172 lb (78.019 kg)  SpO2: 98%   Body mass index is 28.62 kg/(m^2). Physical Exam  Constitutional: He is oriented to person, place, and time. He appears well-developed and well-nourished. No distress.  HENT:  Head: Normocephalic.  Right Ear: External ear normal.  Mouth/Throat: Oropharynx is clear and moist.  Eyes: Conjunctivae and EOM are normal. Pupils are equal, round, and reactive to light. Right eye exhibits no discharge. Left eye exhibits no discharge. No scleral icterus.  Neck: Normal range of motion. No JVD present. No thyromegaly present.  Cardiovascular: Normal rate, regular rhythm, normal heart sounds and intact distal pulses.  Exam reveals no gallop and no friction rub.   No murmur heard. Left Forearm AV fistula positive for thrill and lift.   Pulmonary/Chest: Effort normal and breath sounds normal. No respiratory distress. He has no wheezes. He has no rales.  Abdominal: Soft. Bowel sounds are normal. He exhibits no distension and no mass. There is no tenderness. There is no rebound and no guarding.  Musculoskeletal: Normal range of motion. He exhibits no edema or tenderness.  Lymphadenopathy:    He has no cervical adenopathy.  Neurological: He is oriented to  person, place, and time.  Skin: Skin is warm and dry. No rash noted. No erythema. No pallor.  Psychiatric: He has a normal mood and affect.    Labs reviewed:  Recent Labs  10/07/14 12/23/14 01/20/15 1031  NA 137  --  135*  K 5.5* 4.9 4.9  BUN  --  84* 84*  CREATININE  --  6.6* 6.6*    Recent Labs  12/23/14 01/20/15 1031  AST 12* 11*  ALT 11 12  ALKPHOS  --  187*    Recent Labs  08/26/14 10/07/14 10/21/14 01/20/15 1031  WBC  --  4.3  --   --   HGB 11.1* 12.9* 12.7* 11.0*  HCT 35* 41  --  34*   Lab Results  Component Value Date   TSH 1.050 12/21/2013   Lab Results  Component Value Date   HGBA1C 6.5* 01/20/2015   Lab Results  Component Value Date   CHOL 157 04/30/2014   HDL 39 04/30/2014   LDLCALC 102 04/30/2014   TRIG 82 04/30/2014   CHOLHDL 4.7 09/20/2011    Significant Diagnostic Results in last 30 days:  No results found.  Assessment/Plan 1. Chronic combined systolic and diastolic heart failure (HCC) Stable. No weight gain, edema or shortness of breath.Continue on daily weight.    2. Calciphylaxis of left lower extremity with nonhealing ulcer Resolved. Has drsg for prophylaxis.   3. Coronary artery disease involving native coronary artery of native heart without angina pectoris No chest pain. Continue on Aspirin EC 81 mg Tablet.   4. End stage renal disease on dialysis [facility]) On Dialysis Mon, Wed, Fri. Continue on Renvela 1600 mg three times daily with meals.   5. Dyslipidemia Continue on Lipitor 20 mg Tablet. Ordering lipid panel to drawn during dialysis. Patient refuses labs to be done in the facility.   6. Neuropathic pain Secondary to type 2 DM and HTN. Continue on Neurontin.Fall and safety precautions.  7. Type 2 DM.  Diet controlled. Currently no meds. Ordering Hgb A1C to drawn during  dialysis. Patient refuses labs to be done in the facility.     Family/ staff Communication:Reviewed Plan of care with patient and facility Nurse  supervisor.   Labs/tests ordered:  Lipid panel, Hgb A1C and TSH

## 2015-09-01 LAB — BASIC METABOLIC PANEL
CREATININE: 6.5 mg/dL — AB (ref <=1.3)
GLUCOSE: 197 mg/dL
Potassium: 4.3 mmol/L (ref 3.4–5.3)
SODIUM: 137 mmol/L (ref 137–147)

## 2015-09-01 LAB — CBC AND DIFFERENTIAL: Platelets: 115 10*3/uL — AB (ref 150–399)

## 2015-09-14 ENCOUNTER — Non-Acute Institutional Stay (SKILLED_NURSING_FACILITY): Payer: Medicare Other | Admitting: Internal Medicine

## 2015-09-14 ENCOUNTER — Encounter: Payer: Self-pay | Admitting: Internal Medicine

## 2015-09-14 DIAGNOSIS — E1122 Type 2 diabetes mellitus with diabetic chronic kidney disease: Secondary | ICD-10-CM | POA: Diagnosis not present

## 2015-09-14 DIAGNOSIS — R195 Other fecal abnormalities: Secondary | ICD-10-CM | POA: Diagnosis not present

## 2015-09-14 DIAGNOSIS — E46 Unspecified protein-calorie malnutrition: Secondary | ICD-10-CM

## 2015-09-14 DIAGNOSIS — I953 Hypotension of hemodialysis: Secondary | ICD-10-CM

## 2015-09-14 DIAGNOSIS — Z992 Dependence on renal dialysis: Secondary | ICD-10-CM

## 2015-09-14 DIAGNOSIS — E785 Hyperlipidemia, unspecified: Secondary | ICD-10-CM

## 2015-09-14 DIAGNOSIS — R52 Pain, unspecified: Secondary | ICD-10-CM | POA: Diagnosis not present

## 2015-09-14 DIAGNOSIS — N186 End stage renal disease: Secondary | ICD-10-CM

## 2015-09-14 DIAGNOSIS — I739 Peripheral vascular disease, unspecified: Secondary | ICD-10-CM | POA: Diagnosis not present

## 2015-09-14 DIAGNOSIS — G546 Phantom limb syndrome with pain: Secondary | ICD-10-CM | POA: Insufficient documentation

## 2015-09-14 DIAGNOSIS — M792 Neuralgia and neuritis, unspecified: Secondary | ICD-10-CM

## 2015-09-14 NOTE — Progress Notes (Signed)
Patient ID: Sean [name], male   DOB: [birth year], 80 y.o.   MRN: [id number]      Code Status:Full code   Goals of care:  Advanced Directive information Advanced Directives 07/28/2015  Does patient have an advance directive? No  Would patient like information on creating an advanced directive? No - patient declined information     Allergies  Allergen Reactions  . Benazepril Hcl Hives, Itching and Rash    flareups on patient head    Chief Complaint  Patient presents with  . Medical Management of Chronic Issues    Routine Visit    HPI:  80 y.o. male  Patient is seen today for routine visit. He gets HD 3 days a week. He had an upset stomach yesterday with him feeling queasy and having loose stool. He had 4 loose stools yesterday. Denies any today. He denies any pain at present. No fall reported. No new skin concern from staff. He had callus to his right foot that has resolved per wound care team. No behavioral changes reported.     Review of Systems  Constitutional: Negative for fever, chills and diaphoresis.  HENT: Negative for congestion and hearing loss.   Eyes: Negative for blurred vision.  Respiratory: Negative for cough and wheezing.   Cardiovascular: Negative for chest pain and palpitations.  Gastrointestinal: Negative for heartburn, nausea and vomiting.       Loose stool x 2 today, denies melena or rectal bleed  Genitourinary:       He is anuric  Musculoskeletal: Negative for back pain.  Skin: Negative for rash.  Neurological: Negative for dizziness and headaches.  Endo/Heme/Allergies: Does not bruise/bleed easily.  Psychiatric/Behavioral: Negative for depression.     Past Medical History  Diagnosis Date  . Hyperparathyroidism   . Arthritis   . Hyperlipidemia   . Hypertension   . Neuromuscular disorder ([place])   . Stroke ([place])   . GERD (gastroesophageal reflux disease)   . Migraine   . Heart attack ([place]) 2011; 1990's  . Peripheral neuropathy ([place])   .  Anxiety   . Depression   . Dialysis patient [facility])     M, W, F; [place] CAD (coronary artery disease)     a. s/p NSTEMI and failed RCA PCI in 2011. b. Nuc 12/2013: nonischemic, prior inferior MI.  Marland Kitchen LBBB (left bundle branch block)   . Calciphylaxis     a. Multiple admissions for nonhealing L ulcer/sepsis, s/p L AKA 02/02/2014.  Marland Kitchen Chronic anemia   . Dementia   . ESRD (end stage renal disease) on dialysis ([place])   . Renal insufficiency   . Generalized weakness   . Hypotension     renovascular  . History of left bundle branch block   . Type II diabetes mellitus ([place])     not taking any medications.  . Cholelithiasis   . Hepatic steatosis    Past Surgical History  Procedure Laterality Date  . Parathyroidectomy  2011  . Capd insertion  09/12/2011    Procedure: LAPAROSCOPIC INSERTION CONTINUOUS AMBULATORY PERITONEAL DIALYSIS  (CAPD) CATHETER;  Surgeon: Edward Jolly, MD;  Location: [place];  Service: General;  Laterality: N/A;  . Sp av dialysis shunt access existing *l*  2003  . Dg av dialysis shunt intro ndl*r* or      "not working" (09/19/11)  . Capd removal  03/12/2012    Procedure: CONTINUOUS AMBULATORY PERITONEAL DIALYSIS  (CAPD) CATHETER REMOVAL;  Surgeon: Edward Jolly,  MD;  Location: [place];  Service: General;  Laterality: N/A;  . Amputation Left 02/02/2014    Procedure: AMPUTATION ABOVE KNEE-LEFT;  Surgeon: Elam Dutch, MD;  Location: [place];  Service: Vascular;  Laterality: Left;  . I&d extremity Left 03/31/2014    Procedure: IRRIGATION AND DEBRIDEMENT EXTREMITY - LEFT ABOVE KNEE AMPUTATION;  Surgeon: Elam Dutch, MD;  Location: [place];  Service: Vascular;  Laterality: Left;  . Application of wound vac Left 03/31/2014    Procedure: APPLICATION OF WOUND VAC - LEFT AKA;  Surgeon: Elam Dutch, MD;  Location: [place];  Service: Vascular;  Laterality: Left;  . Colon surgery  2011    colon resection   . Colonoscopy with propofol N/A 07/14/2014     Procedure: COLONOSCOPY WITH PROPOFOL;  Surgeon: Jerene Bears, MD;  [facility];  Service: Gastroenterology;  Laterality: N/A;    Medications:   Medication List       This list is accurate as of: 09/14/15 12:34 PM.  Always use your most recent med list.               acetaminophen 325 MG tablet  Commonly known as:  TYLENOL  Take 650 mg by mouth every 6 (six) hours as needed for mild pain or headache (DO NOT EXCEED 4 GM OF APAP IN 24 HOURS FORM ALL SOURCES).     aspirin 81 MG chewable tablet  Chew 81 mg by mouth daily.     atorvastatin 20 MG tablet  Commonly known as:  LIPITOR  Take 20 mg by mouth at bedtime.     b-complex w/C Tabs tablet  Take 1 tablet by mouth at bedtime.     docusate sodium 100 MG capsule  Commonly known as:  COLACE  Take 100 mg by mouth daily.     feeding supplement (PRO-STAT SUGAR FREE 64) Liqd  Take 30 mLs by mouth daily.     gabapentin 100 MG capsule  Commonly known as:  NEURONTIN  Take 100 mg by mouth at bedtime.     HYDROcodone-acetaminophen 10-325 MG tablet  Commonly known as:  NORCO  Take one tablet by mouth every 6 hours as needed for moderate pain. Do not exceed 4gm of Tylenol in 24 hours     midodrine 5 MG tablet  Commonly known as:  PROAMATINE  Take 5 mg by mouth 3 (three) times daily. Do not take 6 am dose on dialysis days.     sevelamer carbonate 800 MG tablet  Commonly known as:  RENVELA  Take 1,600 mg by mouth daily. 12 pm and 5 pm.         Filed Vitals:   09/14/15 1218  BP: 136/75  Pulse: 81  Temp: 97.6 F (36.4 C)  TempSrc: Oral  Resp: 20  Height: 5\' 5"  (1.651 m)  Weight: 169 lb 12.8 oz (77.021 kg)   Body mass index is 28.26 kg/(m^2).   Wt Readings from Last 3 Encounters:  09/14/15 169 lb 12.8 oz (77.021 kg)  08/19/15 172 lb (78.019 kg)  07/28/15 171 lb (77.565 kg)    Physical Exam  Constitutional: He is oriented to person, place, and time. He appears well-developed and well-nourished.  HENT:    Head: Normocephalic and atraumatic.  Right Ear: External ear normal.  Left Ear: External ear normal.  Mouth/Throat: Oropharynx is clear and moist.  Eyes: Conjunctivae are normal. Pupils are equal, round, and reactive to light.  Right pupil >left Reactive to light  and accomodation.   Neck: Normal range of motion.  Cardiovascular: Normal rate and regular rhythm.   Pulmonary/Chest: Effort normal and breath sounds normal. He has no wheezes. He has no rales. He exhibits no tenderness.  Abdominal: Soft. Bowel sounds are normal.  Musculoskeletal: He exhibits no edema.  Left AKA with prosthesis in place. On wheelchair.   Neurological: He is alert and oriented to person, place, and time.  Skin: Skin is warm and dry.  Calcinosis present   Psychiatric: He has a normal mood and affect.    Labs reviewed: Basic Metabolic Panel:  Recent Labs  10/07/14 12/23/14 01/20/15 1031  NA 137  --  135*  K 5.5* 4.9 4.9  BUN  --  84* 84*  CREATININE  --  6.6* 6.6*   Liver Function Tests:  Recent Labs  12/23/14 01/20/15 1031  AST 12* 11*  ALT 11 12  ALKPHOS  --  187*    CBC:  Recent Labs  10/07/14 10/21/14 01/20/15 1031  WBC 4.3  --   --   HGB 12.9* 12.7* 11.0*  HCT 41  --  34*     Assessment/Plan:    Phantom pain S/p left AKA. Controlled pain. Continue norco 10-325 mg and change this to q8h prn pain.  PAD S/p left AKA. Continue baby aspirin. Continue foot and skin care to right leg  Neuropathic pain Stable. Continue neurontin 100 mg qhs  ESRD Continue HD 3 days a week, continue renvela for now   Hypotension  Associated with HD. Continue midodrine 5 mg tid. Monitor bp per facility protocol  Loose stool With abdominal pain and 4 yesterday and 2 bowel movement today. Currently denies any abdominal pain. Hydration to be maintained. Had fried chicken day before from outside and symptoms started yesterday. Likely had viral gastroenteritis. His colace could be contributing some  too. Discontinue colace  Protein calorie malnutrition Monitor weight. Continue protein supplement  Hyperlipidemia Lipid Panel     Component Value Date/Time   CHOL 157 04/30/2014   TRIG 82 04/30/2014   HDL 39 04/30/2014   CHOLHDL 4.7 09/20/2011 0822   VLDL 24 09/20/2011 0822   LDLCALC 102 04/30/2014   Will need lipid panel checked. Continue lipitor for now  Dm Diet controlled at present. Continue aspirin and statin. bp reading stable. Check a1c Lab Results  Component Value Date   HGBA1C 6.5* 01/20/2015     [name], MD Internal Medicine [facility] [address] [place],  [address] Cell Phone (Monday-Friday 8 am - 5 pm): [phone] On Call: [phone] and follow prompts after 5 pm and on weekends Office Phone: [phone] Office Fax: [phone]

## 2015-09-15 ENCOUNTER — Other Ambulatory Visit: Payer: Self-pay | Admitting: *Deleted

## 2015-09-15 LAB — CBC AND DIFFERENTIAL
HEMATOCRIT: 30 % — AB (ref 41–53)
Hemoglobin: 10.1 g/dL — AB (ref 13.5–17.5)

## 2015-09-15 MED ORDER — HYDROCODONE-ACETAMINOPHEN 10-325 MG PO TABS: ORAL_TABLET | ORAL | Status: DC

## 2015-09-15 NOTE — Telephone Encounter (Signed)
[facility] 

## 2015-10-04 ENCOUNTER — Encounter: Payer: Self-pay | Admitting: Family

## 2015-10-04 ENCOUNTER — Non-Acute Institutional Stay (SKILLED_NURSING_FACILITY): Payer: Medicare Other | Admitting: Family

## 2015-10-04 DIAGNOSIS — E1122 Type 2 diabetes mellitus with diabetic chronic kidney disease: Secondary | ICD-10-CM

## 2015-10-04 DIAGNOSIS — M792 Neuralgia and neuritis, unspecified: Secondary | ICD-10-CM | POA: Diagnosis not present

## 2015-10-04 DIAGNOSIS — Z992 Dependence on renal dialysis: Secondary | ICD-10-CM | POA: Diagnosis not present

## 2015-10-04 DIAGNOSIS — N186 End stage renal disease: Secondary | ICD-10-CM

## 2015-10-04 NOTE — Progress Notes (Signed)
Location:  [place] Room Number: [place]:  SNF [id number]) Provider:  Marlowe Sax, FNP-C  [name], MD  Patient Care Team: [name], MD as PCP - General (Internal Medicine)  Extended Emergency Contact Information Primary Emergency Contact: [facility] Address: [address]          [place], [place] [address] [name] of [place] Phone: [phone] Relation: Daughter Secondary Emergency Contact: [facility] Address: [address]          [place], [place] [address] [name] Mobile Phone: [phone] Relation: Son  Code Status: Full Code  Goals of care: Advanced Directive information Advanced Directives 07/28/2015  Does patient have an advance directive? No  Would patient like information on creating an advanced directive? No - patient declined information     Chief Complaint  Patient presents with  . Acute Visit    Follow-up on foot pain    HPI:  Pt is a 80 y.o. male seen today at [facility] for an acute visit for evaluation of foot pain. He has a medical history of Type 2 DM with peripheral Neuropathy, CHF, CAD, PVD, ESRD, S/p left  AKA among others. He is seen in his room today. He complains of burning sensation to both feet and hands. He is currently on Neurontin. He was recent seen by Podiatrist recommended diabetic shoes.His current diabetic shoes are 78 years old and worn out.Diabetes currently diet controlled.     Past Medical History  Diagnosis Date  . Hyperparathyroidism   . Arthritis   . Hyperlipidemia   . Hypertension   . Neuromuscular disorder ([place])   . Stroke ([place])   . GERD (gastroesophageal reflux disease)   . Migraine   . Heart attack ([place]) 2011; 1990's  . Peripheral neuropathy ([place])   . Anxiety   . Depression   . Dialysis patient [facility])     M, W, F; [place] CAD (coronary artery disease)     a. s/p NSTEMI and failed RCA PCI in  2011. b. Nuc 12/2013: nonischemic, prior inferior MI.  Marland Kitchen LBBB (left bundle branch block)   . Calciphylaxis     a. Multiple admissions for nonhealing L ulcer/sepsis, s/p L AKA 02/02/2014.  Marland Kitchen Chronic anemia   . Dementia   . ESRD (end stage renal disease) on dialysis ([place])   . Renal insufficiency   . Generalized weakness   . Hypotension     renovascular  . History of left bundle branch block   . Type II diabetes mellitus ([place])     not taking any medications.  . Cholelithiasis   . Hepatic steatosis    Past Surgical History  Procedure Laterality Date  . Parathyroidectomy  2011  . Capd insertion  09/12/2011    Procedure: LAPAROSCOPIC INSERTION CONTINUOUS AMBULATORY PERITONEAL DIALYSIS  (CAPD) CATHETER;  Surgeon: Edward Jolly, MD;  Location: [place];  Service: General;  Laterality: N/A;  . Sp av dialysis shunt access existing *l*  2003  . Dg av dialysis shunt intro ndl*r* or      "not working" (09/19/11)  . Capd removal  03/12/2012    Procedure: CONTINUOUS AMBULATORY PERITONEAL DIALYSIS  (CAPD) CATHETER REMOVAL;  Surgeon: Edward Jolly, MD;  Location: [place];  Service: General;  Laterality: N/A;  . Amputation Left 02/02/2014    Procedure: AMPUTATION ABOVE KNEE-LEFT;  Surgeon: Elam Dutch, MD;  Location: [place];  Service: Vascular;  Laterality: Left;  . I&d extremity Left 03/31/2014    Procedure: IRRIGATION AND DEBRIDEMENT EXTREMITY - LEFT ABOVE KNEE AMPUTATION;  Surgeon: Elam Dutch, MD;  Location: [place];  Service: Vascular;  Laterality: Left;  . Application of wound vac Left 03/31/2014    Procedure: APPLICATION OF WOUND VAC - LEFT AKA;  Surgeon: Elam Dutch, MD;  Location: [place];  Service: Vascular;  Laterality: Left;  . Colon surgery  2011    colon resection   . Colonoscopy with propofol N/A 07/14/2014    Procedure: COLONOSCOPY WITH PROPOFOL;  Surgeon: Jerene Bears, MD;  [facility];  Service: Gastroenterology;  Laterality: N/A;    Allergies  Allergen  Reactions  . Benazepril Hcl Hives, Itching and Rash    flareups on patient head      Medication List       This list is accurate as of: 10/04/15  3:22 PM.  Always use your most recent med list.               acetaminophen 325 MG tablet  Commonly known as:  TYLENOL  Take 650 mg by mouth every 6 (six) hours as needed for mild pain, fever or headache (DO NOT EXCEED 4 GM OF APAP IN 24 HOURS FORM ALL SOURCES).     aspirin 81 MG chewable tablet  Chew 81 mg by mouth daily.     atorvastatin 20 MG tablet  Commonly known as:  LIPITOR  Take 20 mg by mouth at bedtime.     b-complex w/C Tabs tablet  Take 1 tablet by mouth at bedtime.     feeding supplement (PRO-STAT SUGAR FREE 64) Liqd  Take 30 mLs by mouth daily.     gabapentin 100 MG capsule  Commonly known as:  NEURONTIN  Take 100 mg by mouth at bedtime.     HYDROcodone-acetaminophen 10-325 MG tablet  Commonly known as:  NORCO  Take one tablet by mouth every 8 hours as needed for moderate pain. Do not exceed 4gm of Tylenol in 24 hours     midodrine 5 MG tablet  Commonly known as:  PROAMATINE  Take 5 mg by mouth 3 (three) times daily. Do not take 6 am dose on M- W- F- d/t  dialysis days.     promethazine 25 MG tablet  Commonly known as:  PHENERGAN  Take 25 mg by mouth every 6 (six) hours as needed for nausea or vomiting.     sevelamer carbonate 800 MG tablet  Commonly known as:  RENVELA  Take 1,600 mg by mouth daily. 12 pm and 5 pm.     UNABLE TO FIND  Med Name: Diabetic Snack daily at bedtime        Review of Systems  Constitutional: Negative for fever, chills, activity change, appetite change and fatigue.  Respiratory: Negative for cough, chest tightness, shortness of breath and wheezing.   Cardiovascular: Negative for chest pain and palpitations.  Skin: Negative for color change, pallor and rash.  Neurological: Negative for dizziness, seizures and headaches.       Numbness and tingling on both hands and feet.     Psychiatric/Behavioral: Negative.     Immunization History  Administered Date(s) Administered  . Influenza-Unspecified 04/03/2014, 04/19/2015  . PPD Test 04/03/2014, 04/17/2014, 02/16/2015  . Pneumococcal Polysaccharide-23 02/08/2014  . Pneumococcal-Unspecified 04/03/2014   Pertinent  Health Maintenance Due  Topic Date Due  . FOOT EXAM  09/06/1945  . OPHTHALMOLOGY EXAM  09/06/1945  .  URINE MICROALBUMIN  09/06/1945  . PNA vac Low Risk Adult (2 of 2 - PCV13) 08/09/2016 (Originally 04/04/2015)  . HEMOGLOBIN A1C  01/04/2016  . INFLUENZA VACCINE  01/11/2016   No flowsheet data found. Functional Status Survey:    Filed Vitals:   10/04/15 1500  BP: 128/84  Pulse: 70  Temp: 98.7 F (37.1 C)  Resp: 20  Height: 5\' 5"  (1.651 m)  Weight: 169 lb 12.8 oz (77.021 kg)  SpO2: 98%   Body mass index is 28.26 kg/(m^2). Physical Exam  Constitutional: He is oriented to person, place, and time. He appears well-developed and well-nourished. No distress.  HENT:  Head: Normocephalic.  Mouth/Throat: Oropharynx is clear and moist.  Eyes: Conjunctivae and EOM are normal. Pupils are equal, round, and reactive to light. Right eye exhibits no discharge. Left eye exhibits no discharge. No scleral icterus.  Neck: Normal range of motion. No JVD present. No thyromegaly present.  Cardiovascular: Normal rate, regular rhythm, normal heart sounds and intact distal pulses.  Exam reveals no gallop and no friction rub.   No murmur heard. Pulmonary/Chest: Effort normal and breath sounds normal. No respiratory distress. He has no wheezes. He has no rales.  Lymphadenopathy:    He has no cervical adenopathy.  Neurological: He is oriented to person, place, and time.  Skin: Skin is warm and dry. No rash noted. No erythema. No pallor.  Psychiatric: He has a normal mood and affect.    Labs reviewed:  Recent Labs  10/07/14 12/23/14 01/20/15 1031 08/18/15 09/01/15  NA 137  --  135*  --  137  K 5.5* 4.9 4.9   --  4.3  BUN  --  84* 84* 55*  --   CREATININE  --  6.6* 6.6*  --  6.5*    Recent Labs  12/23/14 01/20/15 1031  AST 12* 11*  ALT 11 12  ALKPHOS  --  187*    Recent Labs  10/07/14 10/21/14 01/20/15 1031 09/01/15 09/15/15  WBC 4.3  --   --   --   --   HGB 12.9* 12.7* 11.0*  --  10.1*  HCT 41  --  34*  --  30*  PLT  --   --   --  115*  --    Lab Results  Component Value Date   TSH 1.050 12/21/2013   Lab Results  Component Value Date   HGBA1C 6.6 07/07/2015   Lab Results  Component Value Date   CHOL 136 01/20/2015   HDL 57 01/20/2015   LDLCALC 73 01/20/2015   TRIG 30* 01/20/2015   CHOLHDL 4.7 09/20/2011    Significant Diagnostic Results in last 30 days:  No results found.  Assessment/Plan Neuropathic pain Numbness and tingling to both hands and feet. Continue on Hydrocodone-APAP and Gabapentin. Requesting diabetic shoes current shoes are 78 years old and worn out. Fall and safety precautions. Assist with ADL's as needed.   Type 2 DM with peripheral Neuropathy Diet controlled. Recent Hgb A1C 6.6 (06/2015). Continue to monitor Hgb A1C. Continue consistent calorie diet.     Family/ staff Communication: Reviewed plan with patient and facility Nurse supervisor.   Labs/tests ordered:  None

## 2015-10-14 ENCOUNTER — Encounter: Payer: Self-pay | Admitting: Family

## 2015-10-14 ENCOUNTER — Non-Acute Institutional Stay (SKILLED_NURSING_FACILITY): Payer: Medicare Other | Admitting: Family

## 2015-10-14 DIAGNOSIS — M792 Neuralgia and neuritis, unspecified: Secondary | ICD-10-CM | POA: Diagnosis not present

## 2015-10-14 DIAGNOSIS — N186 End stage renal disease: Secondary | ICD-10-CM

## 2015-10-14 DIAGNOSIS — K5901 Slow transit constipation: Secondary | ICD-10-CM

## 2015-10-14 DIAGNOSIS — Z992 Dependence on renal dialysis: Secondary | ICD-10-CM | POA: Diagnosis not present

## 2015-10-14 DIAGNOSIS — R600 Localized edema: Secondary | ICD-10-CM | POA: Diagnosis not present

## 2015-10-14 DIAGNOSIS — I5042 Chronic combined systolic (congestive) and diastolic (congestive) heart failure: Secondary | ICD-10-CM

## 2015-10-14 DIAGNOSIS — E1122 Type 2 diabetes mellitus with diabetic chronic kidney disease: Secondary | ICD-10-CM

## 2015-10-14 DIAGNOSIS — K219 Gastro-esophageal reflux disease without esophagitis: Secondary | ICD-10-CM

## 2015-10-14 NOTE — Progress Notes (Signed)
Location:  [place] Room Number: [place] of Service:  SNF (31) Provider:  Marlowe Sax, NP  [name], MD  Patient Care Team: [name], MD as PCP - General (Internal Medicine)  Extended Emergency Contact Information Primary Emergency Contact: [facility] Address: [address]          [place], [place] [address] [name] of [place] Phone: [phone] Relation: Daughter Secondary Emergency Contact: Jyrin, [place] Address: [address]          [place], [place] [address] [name] Mobile Phone: [phone] Relation: Son  Code Status: Full Code  Goals of care: Advanced Directive information Advanced Directives 07/28/2015  Does patient have an advance directive? No  Would patient like information on creating an advanced directive? No - patient declined information     Chief Complaint  Patient presents with  . Medical Management of Chronic Issues    Routine Visit    HPI:  Pt is a 80 y.o. male seen today at [facility] for medical management of chronic diseases.  He has a  Medical history of HTN, GERD, ESRD, Stroke, Neuropathy , Depression among others. He is seen in his room today. He states feeling well other than he has had issues with constipation. He continues with dialysis on Mon, Wed and Fridays. Facility staff reports no new concerns.    Past Medical History  Diagnosis Date  . Hyperparathyroidism   . Arthritis   . Hyperlipidemia   . Hypertension   . Neuromuscular disorder ([place])   . Stroke ([place])   . GERD (gastroesophageal reflux disease)   . Migraine   . Heart attack ([place]) 2011; 1990's  . Peripheral neuropathy ([place])   . Anxiety   . Depression   . Dialysis patient [facility])     M, W, F; [place] CAD (coronary artery disease)     a. s/p NSTEMI and failed RCA PCI in 2011. b. Nuc 12/2013: nonischemic, prior inferior MI.  Marland Kitchen LBBB (left bundle branch block)     . Calciphylaxis     a. Multiple admissions for nonhealing L ulcer/sepsis, s/p L AKA 02/02/2014.  Marland Kitchen Chronic anemia   . Dementia   . ESRD (end stage renal disease) on dialysis ([place])   . Renal insufficiency   . Generalized weakness   . Hypotension     renovascular  . History of left bundle branch block   . Type II diabetes mellitus ([place])     not taking any medications.  . Cholelithiasis   . Hepatic steatosis    Past Surgical History  Procedure Laterality Date  . Parathyroidectomy  2011  . Capd insertion  09/12/2011    Procedure: LAPAROSCOPIC INSERTION CONTINUOUS AMBULATORY PERITONEAL DIALYSIS  (CAPD) CATHETER;  Surgeon: Edward Jolly, MD;  Location: [place];  Service: General;  Laterality: N/A;  . Sp av dialysis shunt access existing *l*  2003  . Dg av dialysis shunt intro ndl*r* or      "not working" (09/19/11)  . Capd removal  03/12/2012    Procedure: CONTINUOUS AMBULATORY PERITONEAL DIALYSIS  (CAPD) CATHETER REMOVAL;  Surgeon: Edward Jolly, MD;  Location: [place];  Service: General;  Laterality: N/A;  . Amputation Left 02/02/2014    Procedure: AMPUTATION ABOVE KNEE-LEFT;  Surgeon: Elam Dutch, MD;  Location: [place];  Service: Vascular;  Laterality: Left;  . I&d extremity Left 03/31/2014    Procedure: [place]  EXTREMITY - LEFT ABOVE KNEE AMPUTATION;  Surgeon: Elam Dutch, MD;  Location: [place];  Service: Vascular;  Laterality: Left;  . Application of wound vac Left 03/31/2014    Procedure: APPLICATION OF WOUND VAC - LEFT AKA;  Surgeon: Elam Dutch, MD;  Location: [place];  Service: Vascular;  Laterality: Left;  . Colon surgery  2011    colon resection   . Colonoscopy with propofol N/A 07/14/2014    Procedure: COLONOSCOPY WITH PROPOFOL;  Surgeon: Jerene Bears, MD;  [facility];  Service: Gastroenterology;  Laterality: N/A;    Allergies  Allergen Reactions  . Benazepril Hcl Hives, Itching and Rash    flareups on patient head       Medication List       This list is accurate as of: 10/14/15 11:53 AM.  Always use your most recent med list.               acetaminophen 325 MG tablet  Commonly known as:  TYLENOL  Take 650 mg by mouth every 6 (six) hours as needed for mild pain, fever or headache (DO NOT EXCEED 4 GM OF APAP IN 24 HOURS FORM ALL SOURCES).     aspirin 81 MG chewable tablet  Chew 81 mg by mouth daily.     atorvastatin 20 MG tablet  Commonly known as:  LIPITOR  Take 20 mg by mouth at bedtime.     b-complex w/C Tabs tablet  Take 1 tablet by mouth at bedtime.     feeding supplement (PRO-STAT SUGAR FREE 64) Liqd  Take 30 mLs by mouth daily.     gabapentin 100 MG capsule  Commonly known as:  NEURONTIN  Take 100 mg by mouth at bedtime.     HYDROcodone-acetaminophen 10-325 MG tablet  Commonly known as:  NORCO  Take one tablet by mouth every 8 hours as needed for moderate pain. Do not exceed 4gm of Tylenol in 24 hours     midodrine 5 MG tablet  Commonly known as:  PROAMATINE  Take 5 mg by mouth 3 (three) times daily. Do not take 6 am dose on M- W- F- d/t  dialysis days.     promethazine 25 MG tablet  Commonly known as:  PHENERGAN  Take 25 mg by mouth every 6 (six) hours as needed for nausea or vomiting. For up to 24 hours.     sevelamer carbonate 800 MG tablet  Commonly known as:  RENVELA  Take 800 mg by mouth 3 (three) times daily with meals. 12 pm and 5 pm.     UNABLE TO FIND  Med Name: Diabetic Snack daily at bedtime     ZOFRAN 4 MG tablet  Generic drug:  ondansetron  Take 4 mg by mouth every 6 (six) hours as needed for nausea.        Review of Systems  Constitutional: Negative for fever, chills, activity change, appetite change and fatigue.  HENT: Negative for congestion, rhinorrhea, sinus pressure, sneezing and sore throat.   Eyes: Negative for discharge and itching.  Respiratory: Negative for cough, chest tightness, shortness of breath and wheezing.   Cardiovascular: Negative  for chest pain and palpitations.  Gastrointestinal: Positive for nausea and constipation. Negative for vomiting, abdominal pain, diarrhea and abdominal distention.  Endocrine: Negative.   Genitourinary:       Hemodialysis On Mon, Wed and Friday   Musculoskeletal: Positive for gait problem.  Skin: Negative for color change, pallor and rash.  Neurological: Negative for dizziness, seizures and headaches.       Numbness and tingling on both hands and feet.   Psychiatric/Behavioral: Negative.     Immunization History  Administered Date(s) Administered  . Influenza-Unspecified 04/03/2014, 04/19/2015  . PPD Test 04/03/2014, 04/17/2014, 02/16/2015  . Pneumococcal Polysaccharide-23 02/08/2014  . Pneumococcal-Unspecified 04/03/2014   Pertinent  Health Maintenance Due  Topic Date Due  . OPHTHALMOLOGY EXAM  06/12/2016 (Originally 09/06/1945)  . URINE MICROALBUMIN  06/12/2016 (Originally 09/06/1945)  . PNA vac Low Risk Adult (2 of 2 - PCV13) 08/09/2016 (Originally 04/04/2015)  . HEMOGLOBIN A1C  01/04/2016  . INFLUENZA VACCINE  01/11/2016  . FOOT EXAM  08/18/2016   No flowsheet data found. Functional Status Survey:    Filed Vitals:   10/14/15 1122  BP: 144/76  Pulse: 89  Temp: 97.5 F (36.4 C)  Resp: 18  Height: 5\' 5"  (1.651 m)  Weight: 170 lb 3.2 oz (77.202 kg)  SpO2: 98%   Body mass index is 28.32 kg/(m^2). Physical Exam  Constitutional: He is oriented to person, place, and time. He appears well-developed and well-nourished. No distress.  HENT:  Head: Normocephalic.  Mouth/Throat: Oropharynx is clear and moist.  Eyes: Conjunctivae are normal. Right eye exhibits no discharge. Left eye exhibits no discharge. No scleral icterus.  Neck: Normal range of motion. No JVD present.  Cardiovascular: Normal rate, regular rhythm, normal heart sounds and intact distal pulses.  Exam reveals no gallop and no friction rub.   No murmur heard. Pulmonary/Chest: Effort normal and breath sounds  normal. He has no wheezes. He has no rales.  Abdominal: Soft. Bowel sounds are normal. He exhibits no distension and no mass. There is no tenderness. There is no rebound and no guarding.  Musculoskeletal: Normal range of motion. He exhibits no tenderness.  Bilateral Lower extremities +2 edema   Lymphadenopathy:    He has no cervical adenopathy.  Neurological: He is oriented to person, place, and time.  Skin: Skin is warm and dry. No rash noted. No erythema. No pallor.  Left forearm AV fistula dialysis access positive for Bruit and thrill no sighs of redness or drainage.   Psychiatric: He has a normal mood and affect.    Labs reviewed:  Recent Labs  12/23/14 01/20/15 1031 08/18/15 09/01/15  NA  --  135*  --  137  K 4.9 4.9  --  4.3  BUN 84* 84* 55*  --   CREATININE 6.6* 6.6*  --  6.5*    Recent Labs  12/23/14 01/20/15 1031  AST 12* 11*  ALT 11 12  ALKPHOS  --  187*    Recent Labs  10/21/14 01/20/15 1031 09/01/15 09/15/15  HGB 12.7* 11.0*  --  10.1*  HCT  --  34*  --  30*  PLT  --   --  115*  --    Lab Results  Component Value Date   TSH 1.050 12/21/2013   Lab Results  Component Value Date   HGBA1C 6.6 07/07/2015   Lab Results  Component Value Date   CHOL 136 01/20/2015   HDL 57 01/20/2015   LDLCALC 73 01/20/2015   TRIG 30* 01/20/2015   CHOLHDL 4.7 09/20/2011    Significant Diagnostic Results in last 30 days:  No results found.  Assessment/Plan CHF No weight gain, shortness of breath, wheezing or rales. Monitor weight.   Type 2 DM Diet controlled. Monitor Hgb A1C   Neuropathy Continue on Gabapentin 100 mg Capsule.   GERD  Worsening acid reflux. Will start on Protonix 40 mg Tablet daily X 8 weeks  Edema  Right leg + 2 edema. Right leg Knee high Ted hose on in the morning and off at bedtime.   Constipation  Colace d/ced 09/14/15. Has been straining with hard stool. Restart colace 100 mg Capsule daily.    Family/ staff Communication: Reviewed plan  of care with patient and facility Nurse supervisor   Labs/tests ordered:  None

## 2015-11-06 ENCOUNTER — Inpatient Hospital Stay (HOSPITAL_COMMUNITY)
Admission: EM | Admit: 2015-11-06 | Discharge: 2015-11-10 | DRG: 438 | Disposition: A | Discharge status: Skilled Nursing Facility | Payer: Medicare Other | Source: Skilled Nursing Facility | Attending: Internal Medicine | Admitting: Internal Medicine

## 2015-11-06 ENCOUNTER — Encounter (HOSPITAL_COMMUNITY): Payer: Self-pay | Admitting: Emergency Medicine

## 2015-11-06 DIAGNOSIS — K56609 Unspecified intestinal obstruction, unspecified as to partial versus complete obstruction: Secondary | ICD-10-CM

## 2015-11-06 DIAGNOSIS — K5660 Unspecified intestinal obstruction: Secondary | ICD-10-CM | POA: Diagnosis present

## 2015-11-06 DIAGNOSIS — R1011 Right upper quadrant pain: Secondary | ICD-10-CM | POA: Diagnosis not present

## 2015-11-06 DIAGNOSIS — R1013 Epigastric pain: Secondary | ICD-10-CM | POA: Diagnosis present

## 2015-11-06 DIAGNOSIS — Z89612 Acquired absence of left leg above knee: Secondary | ICD-10-CM

## 2015-11-06 DIAGNOSIS — Z87891 Personal history of nicotine dependence: Secondary | ICD-10-CM

## 2015-11-06 DIAGNOSIS — D631 Anemia in chronic kidney disease: Secondary | ICD-10-CM | POA: Diagnosis present

## 2015-11-06 DIAGNOSIS — N186 End stage renal disease: Secondary | ICD-10-CM

## 2015-11-06 DIAGNOSIS — Z89619 Acquired absence of unspecified leg above knee: Secondary | ICD-10-CM

## 2015-11-06 DIAGNOSIS — I132 Hypertensive heart and chronic kidney disease with heart failure and with stage 5 chronic kidney disease, or end stage renal disease: Secondary | ICD-10-CM | POA: Diagnosis present

## 2015-11-06 DIAGNOSIS — K851 Biliary acute pancreatitis without necrosis or infection: Principal | ICD-10-CM | POA: Diagnosis present

## 2015-11-06 DIAGNOSIS — K567 Ileus, unspecified: Secondary | ICD-10-CM | POA: Diagnosis present

## 2015-11-06 DIAGNOSIS — Z8673 Personal history of transient ischemic attack (TIA), and cerebral infarction without residual deficits: Secondary | ICD-10-CM

## 2015-11-06 DIAGNOSIS — Z9049 Acquired absence of other specified parts of digestive tract: Secondary | ICD-10-CM

## 2015-11-06 DIAGNOSIS — K219 Gastro-esophageal reflux disease without esophagitis: Secondary | ICD-10-CM | POA: Diagnosis present

## 2015-11-06 DIAGNOSIS — E1142 Type 2 diabetes mellitus with diabetic polyneuropathy: Secondary | ICD-10-CM | POA: Diagnosis present

## 2015-11-06 DIAGNOSIS — D696 Thrombocytopenia, unspecified: Secondary | ICD-10-CM | POA: Diagnosis present

## 2015-11-06 DIAGNOSIS — E785 Hyperlipidemia, unspecified: Secondary | ICD-10-CM | POA: Diagnosis present

## 2015-11-06 DIAGNOSIS — I5022 Chronic systolic (congestive) heart failure: Secondary | ICD-10-CM | POA: Insufficient documentation

## 2015-11-06 DIAGNOSIS — K8010 Calculus of gallbladder with chronic cholecystitis without obstruction: Secondary | ICD-10-CM | POA: Diagnosis present

## 2015-11-06 DIAGNOSIS — I255 Ischemic cardiomyopathy: Secondary | ICD-10-CM | POA: Diagnosis present

## 2015-11-06 DIAGNOSIS — E1122 Type 2 diabetes mellitus with diabetic chronic kidney disease: Secondary | ICD-10-CM

## 2015-11-06 DIAGNOSIS — I2510 Atherosclerotic heart disease of native coronary artery without angina pectoris: Secondary | ICD-10-CM | POA: Diagnosis present

## 2015-11-06 DIAGNOSIS — I252 Old myocardial infarction: Secondary | ICD-10-CM

## 2015-11-06 DIAGNOSIS — I5042 Chronic combined systolic (congestive) and diastolic (congestive) heart failure: Secondary | ICD-10-CM | POA: Diagnosis present

## 2015-11-06 DIAGNOSIS — Z7982 Long term (current) use of aspirin: Secondary | ICD-10-CM

## 2015-11-06 DIAGNOSIS — Z992 Dependence on renal dialysis: Secondary | ICD-10-CM

## 2015-11-06 DIAGNOSIS — E8889 Other specified metabolic disorders: Secondary | ICD-10-CM | POA: Diagnosis present

## 2015-11-06 HISTORY — DX: Other specified anxiety disorders: F41.8

## 2015-11-06 LAB — I-STAT CG4 LACTIC ACID, ED: Lactic Acid, Venous: 1.93 mmol/L (ref 0.5–2.0)

## 2015-11-06 MED ORDER — HYDROMORPHONE HCL 1 MG/ML IJ SOLN
1.0000 mg | Freq: Once | INTRAMUSCULAR | Status: AC
  Administered 2015-11-07: 1 mg via INTRAVENOUS
  Filled 2015-11-06: qty 1

## 2015-11-06 MED ORDER — FAMOTIDINE IN NACL 20-0.9 MG/50ML-% IV SOLN
20.0000 mg | Freq: Once | INTRAVENOUS | Status: AC
  Administered 2015-11-07: 20 mg via INTRAVENOUS
  Filled 2015-11-06: qty 50

## 2015-11-06 NOTE — ED Provider Notes (Signed)
CSN: [id number]     Arrival date & time 11/06/15  2309 History  By signing my name below, I, Altamease Oiler, attest that this documentation has been prepared under the direction and in the presence of Varney Biles, MD. Electronically Signed: Altamease Oiler, ED Scribe. 11/07/2015. 3:08 AM  Chief Complaint  Patient presents with  . Abdominal Pain   The history is provided by the patient. No language interpreter was used.   Sean [name] is a 80 y.o. male with PMHx of ischemic colitis, cholelithiasis, DM, ESRD on M/W/F hemodialysis who presents to the Emergency Department complaining of intermittent, non-radiating, periumbilical abdominal pain with onset 1 month ago. The "griping" pain is described as sharp and dull and he notes that it waxes and wanes in severity. Pt states that he has had intermittent constipation and abdominal pain for 1 month but the pain worsened today and has been constant since he strained to have a bowel movement this morning. He has not been able to eat since this morning but has been drinking water.  Associated symptoms include nausea. Pt denies vomiting and hematochezia. Past surgical history includes colon resection.   Past Medical History  Diagnosis Date  . Hyperparathyroidism   . Arthritis   . Hyperlipidemia   . Hypertension   . Stroke ([place])   . GERD (gastroesophageal reflux disease)   . Migraine   . Heart attack ([place]) 2011; 1990's  . Peripheral neuropathy ([place])   . Depression with anxiety   . Dialysis patient [facility])     M, W, F; [place] CAD (coronary artery disease)     a. s/p NSTEMI and failed RCA PCI in 2011. b. Nuc 12/2013: nonischemic, prior inferior MI.  Marland Kitchen LBBB (left bundle branch block)   . Calciphylaxis     a. Multiple admissions for nonhealing L ulcer/sepsis, s/p L AKA 02/02/2014.  Marland Kitchen Chronic anemia   . Dementia   . ESRD (end stage renal disease) on dialysis ([place])   . Renal insufficiency   . Hypotension     renovascular  .  History of left bundle branch block   . Type II diabetes mellitus ([place])     not taking any medications.  . Cholelithiasis 01/2014    acute cholecystitis managed with perc drain due to high surgical risk.    Past Surgical History  Procedure Laterality Date  . Parathyroidectomy  2011  . Capd insertion  09/12/2011    Procedure: LAPAROSCOPIC INSERTION CONTINUOUS AMBULATORY PERITONEAL DIALYSIS  (CAPD) CATHETER;  Surgeon: Edward Jolly, MD;  Location: [place];  Service: General;  Laterality: N/A;  . Sp av dialysis shunt access existing *l*  2003  . Dg av dialysis shunt intro ndl*r* or      "not working" (09/19/11)  . Capd removal  03/12/2012    Procedure: CONTINUOUS AMBULATORY PERITONEAL DIALYSIS  (CAPD) CATHETER REMOVAL;  Surgeon: Edward Jolly, MD;  Location: [place];  Service: General;  Laterality: N/A;  . Amputation Left 02/02/2014    Procedure: AMPUTATION ABOVE KNEE-LEFT;  Surgeon: Elam Dutch, MD;  Location: [place];  Service: Vascular;  Laterality: Left;  . I&d extremity Left 03/31/2014    Procedure: IRRIGATION AND DEBRIDEMENT EXTREMITY - LEFT ABOVE KNEE AMPUTATION;  Surgeon: Elam Dutch, MD;  Location: [place];  Service: Vascular;  Laterality: Left;  . Application of wound vac Left 03/31/2014    Procedure: APPLICATION OF WOUND VAC - LEFT AKA;  Surgeon: Elam Dutch, MD;  Location: [facility];  Service: Vascular;  Laterality: Left;  . Colon surgery  2011    colon resection   . Colonoscopy with propofol N/A 07/14/2014    Procedure: COLONOSCOPY WITH PROPOFOL;  Surgeon: Jerene Bears, MD;  Location: [facility];  Service: Gastroenterology;  Laterality: N/A;   Family History  Problem Relation Age of Onset  . Cancer Sister     breast  . Cancer Brother     lung  . Anesthesia problems Neg Hx   . Hypotension Neg Hx   . Malignant hyperthermia Neg Hx   . Pseudochol deficiency Neg Hx    Social History  Substance Use Topics  . Smoking status: Former Smoker -- 0.25 packs/day for 7  years    Types: Cigarettes    Quit date: 06/12/1976  . Smokeless tobacco: Never Used  . Alcohol Use: No    Review of Systems  10 Systems reviewed and all are negative for acute change except as noted in the HPI.  Allergies  Benazepril hcl  Home Medications   Prior to Admission medications   Medication Sig Start Date End Date Taking? Authorizing Provider  acetaminophen (TYLENOL) 325 MG tablet Take 650 mg by mouth every 6 (six) hours as needed for mild pain, fever or headache (DO NOT EXCEED 4 GM OF APAP IN 24 HOURS FORM ALL SOURCES).    Yes Historical Provider, MD  Amino Acids-Protein Hydrolys (FEEDING SUPPLEMENT, PRO-STAT SUGAR FREE 64,) LIQD Take 30 mLs by mouth daily.   Yes Historical Provider, MD  aspirin 81 MG chewable tablet Chew 81 mg by mouth daily.    Yes Historical Provider, MD  atorvastatin (LIPITOR) 20 MG tablet Take 20 mg by mouth at bedtime.    Yes Historical Provider, MD  b-complex w/C (TOTAL B/C) TABS tablet Take 1 tablet by mouth at bedtime.   Yes Historical Provider, MD  docusate sodium (COLACE) 100 MG capsule Take 100 mg by mouth daily.   Yes Historical Provider, MD  gabapentin (NEURONTIN) 100 MG capsule Take 100 mg by mouth at bedtime.    Yes Historical Provider, MD  midodrine (PROAMATINE) 5 MG tablet Take 5 mg by mouth 3 (three) times daily. Do not take 6 am dose on M- W- F- d/t  dialysis days. 02/01/15  Yes Historical Provider, MD  ondansetron (ZOFRAN) 4 MG tablet Take 4 mg by mouth every 6 (six) hours as needed for nausea.   Yes Historical Provider, MD  sevelamer carbonate (RENVELA) 800 MG tablet Take 800 mg by mouth daily with lunch. 12 pm and 5 pm.   Yes Historical Provider, MD  sorbitol 70 % solution Take 30 mLs by mouth daily as needed (severe constipation).   Yes Historical Provider, MD  HYDROcodone-acetaminophen (NORCO) 10-325 MG tablet Take one tablet by mouth every 8 hours as needed for moderate pain. Do not exceed 4gm of Tylenol in 24 hours 11/11/15   Tiffany L  Reed, DO   BP 173/94 mmHg  Pulse 88  Temp(Src) 98.1 F (36.7 C) (Oral)  Resp 12  Ht 5\' 7"  (1.702 m)  Wt 180 lb 12.8 oz (82.01 kg)  BMI 28.31 kg/m2  SpO2 95% Physical Exam  Constitutional: He is oriented to person, place, and time. He appears well-developed and well-nourished.  HENT:  Head: Normocephalic and atraumatic.  Eyes: EOM are normal.  Neck: Normal range of motion.  Cardiovascular: Normal rate, regular rhythm and intact distal pulses.   Murmur heard.  Systolic murmur is present  Pulmonary/Chest: Effort  normal and breath sounds normal. No respiratory distress.  Lungs clear to anterior auscultation   Abdominal: Soft. Bowel sounds are normal. He exhibits no distension. There is tenderness.  Pt has upper quadrant tenderness, worse in the epigastrium and RUQ   Musculoskeletal: Normal range of motion.  Neurological: He is alert and oriented to person, place, and time.  Skin: Skin is warm and dry.  Psychiatric: He has a normal mood and affect. Judgment normal.  Nursing note and vitals reviewed.   ED Course  Procedures (including critical care time) DIAGNOSTIC STUDIES: Oxygen Saturation is 95% on RA,  normal by my interpretation.    COORDINATION OF CARE: 11:36 PM Discussed treatment plan which includes lab work, RUQ [place], Dilaudid, and Pepcid with pt at bedside and pt agreed to plan.  1:46 AM Pt labs and [place] reviewed, pt is having gallstone pancreatitis. Spoke with Dr. [place] Stage, General Surgery and he recommends medicine admission with GI consult and starting abx. Surgery can be consulted, if needed, by the admitting team.   2:12 AM-Consult complete with Dr. Tamala Julian [facility]). Patient case explained and discussed. Agrees to admit patient for further evaluation and treatment.   2:58 AM-Consult complete with Dr. Cristina Gong (GI). Patient case explained and discussed. Agrees to consult. Call ended at 3:05 AM    Labs Review Labs Reviewed  CBC WITH DIFFERENTIAL/PLATELET -  Abnormal; Notable for the following:    Hemoglobin 11.3 (*)    HCT 35.4 (*)    RDW 15.6 (*)    Platelets 93 (*)    Lymphs Abs 0.4 (*)    All other components within normal limits  COMPREHENSIVE METABOLIC PANEL - Abnormal; Notable for the following:    Sodium 134 (*)    Chloride 97 (*)    Glucose, Bld 142 (*)    BUN 23 (*)    Creatinine, Ser 5.37 (*)    Calcium 8.4 (*)    Total Protein 8.3 (*)    ALT 11 (*)    Alkaline Phosphatase 195 (*)    GFR calc non Af Amer 9 (*)    GFR calc Af Amer 11 (*)    All other components within normal limits  PHOSPHORUS - Abnormal; Notable for the following:    Phosphorus 2.4 (*)    All other components within normal limits  LIPASE, BLOOD - Abnormal; Notable for the following:    Lipase 105 (*)    All other components within normal limits  COMPREHENSIVE METABOLIC PANEL - Abnormal; Notable for the following:    Chloride 96 (*)    Glucose, Bld 171 (*)    BUN 28 (*)    Creatinine, Ser 5.81 (*)    Calcium 8.5 (*)    ALT 11 (*)    Alkaline Phosphatase 191 (*)    Total Bilirubin 1.5 (*)    GFR calc non Af Amer 8 (*)    GFR calc Af Amer 10 (*)    Anion gap 16 (*)    All other components within normal limits  CBC - Abnormal; Notable for the following:    Hemoglobin 11.1 (*)    HCT 35.6 (*)    RDW 15.8 (*)    Platelets 103 (*)    All other components within normal limits  GLUCOSE, CAPILLARY - Abnormal; Notable for the following:    Glucose-Capillary 141 (*)    All other components within normal limits  GLUCOSE, CAPILLARY - Abnormal; Notable for the following:    Glucose-Capillary 154 (*)  All other components within normal limits  GLUCOSE, CAPILLARY - Abnormal; Notable for the following:    Glucose-Capillary 126 (*)    All other components within normal limits  COMPREHENSIVE METABOLIC PANEL - Abnormal; Notable for the following:    Chloride 99 (*)    BUN 36 (*)    Creatinine, Ser 6.94 (*)    Calcium 7.8 (*)    Albumin 3.3 (*)    ALT  11 (*)    Alkaline Phosphatase 165 (*)    GFR calc non Af Amer 7 (*)    GFR calc Af Amer 8 (*)    All other components within normal limits  CBC WITH DIFFERENTIAL/PLATELET - Abnormal; Notable for the following:    RBC 4.02 (*)    Hemoglobin 10.6 (*)    HCT 33.6 (*)    RDW 15.8 (*)    Platelets 105 (*)    Lymphs Abs 0.6 (*)    All other components within normal limits  RENAL FUNCTION PANEL - Abnormal; Notable for the following:    Chloride 98 (*)    BUN 36 (*)    Creatinine, Ser 6.90 (*)    Calcium 7.7 (*)    Albumin 3.2 (*)    GFR calc non Af Amer 7 (*)    GFR calc Af Amer 8 (*)    All other components within normal limits  LIPASE, BLOOD - Abnormal; Notable for the following:    Lipase 65 (*)    All other components within normal limits  CBC WITH DIFFERENTIAL/PLATELET - Abnormal; Notable for the following:    RBC 3.77 (*)    Hemoglobin 9.9 (*)    HCT 32.3 (*)    RDW 16.0 (*)    Platelets 86 (*)    All other components within normal limits  COMPREHENSIVE METABOLIC PANEL - Abnormal; Notable for the following:    Chloride 97 (*)    Glucose, Bld 60 (*)    Creatinine, Ser 4.57 (*)    Calcium 7.7 (*)    Albumin 3.0 (*)    ALT 11 (*)    Alkaline Phosphatase 141 (*)    Total Bilirubin 1.6 (*)    GFR calc non Af Amer 11 (*)    GFR calc Af Amer 13 (*)    Anion gap 19 (*)    All other components within normal limits  GLUCOSE, CAPILLARY - Abnormal; Notable for the following:    Glucose-Capillary 172 (*)    All other components within normal limits  CBC WITH DIFFERENTIAL/PLATELET - Abnormal; Notable for the following:    WBC 2.8 (*)    RBC 3.66 (*)    Hemoglobin 9.5 (*)    HCT 30.8 (*)    RDW 15.8 (*)    Platelets 91 (*)    Lymphs Abs 0.6 (*)    All other components within normal limits  COMPREHENSIVE METABOLIC PANEL - Abnormal; Notable for the following:    Chloride 98 (*)    BUN 28 (*)    Creatinine, Ser 6.41 (*)    Calcium 7.6 (*)    Albumin 3.0 (*)    ALT 11 (*)     Alkaline Phosphatase 135 (*)    GFR calc non Af Amer 7 (*)    GFR calc Af Amer 9 (*)    All other components within normal limits  RENAL FUNCTION PANEL - Abnormal; Notable for the following:    Chloride 98 (*)    BUN 27 (*)  Creatinine, Ser 6.38 (*)    Calcium 7.5 (*)    Albumin 3.0 (*)    GFR calc non Af Amer 7 (*)    GFR calc Af Amer 9 (*)    All other components within normal limits  MRSA PCR SCREENING  MAGNESIUM  MAGNESIUM  HEPATITIS B SURFACE ANTIGEN  MAGNESIUM  MAGNESIUM  I-STAT CG4 LACTIC ACID, ED    Imaging Review No results found. I have personally reviewed and evaluated these images and lab results as part of my medical decision-making.   EKG Interpretation None      MDM   Final diagnoses:  RUQ abdominal pain  Gall stone pancreatitis    I personally performed the services described in this documentation, which was scribed in my presence. The recorded information has been reviewed and is accurate.  Pt comes in with cc of abd pain. He has complex past medical history: ESRD HD MWF, CAD s/p MI 2011 and 7/15, HTN, ischemic cardiomyopathy with EF 25%, DM2, hyperparathyroidism.  He also has hx of ischemic bowel in 2015 that was treated medically. Surgical hx is significant for 2015 LLE amputation due to infection of the wound/ulcer and perc chole placed for acute cholecystitis.  Pt comes in with 1 month of abd pain, fairly regular, worse after food and progressively getting worse. Exam shows RUQ and epigastric tenderness. RUQ US shows cholelithiasis.  Varney Biles, MD 11/12/15 [phone]

## 2015-11-06 NOTE — ED Notes (Signed)
Pt arrives by [facility] from [facility] with c/o lower abdominal pain. Stated it has happened before and that he has constipation issues. His last BM was this morning, but pt states he had to strain very hard. Pt is anuric, has dialysis MWF, left arm fistula. Pt is blind in right eye. Last vitals 140/80, HR 90, RR 20

## 2015-11-07 ENCOUNTER — Emergency Department (HOSPITAL_COMMUNITY): Payer: Medicare Other

## 2015-11-07 ENCOUNTER — Encounter (HOSPITAL_COMMUNITY): Payer: Self-pay | Admitting: General Practice

## 2015-11-07 ENCOUNTER — Inpatient Hospital Stay (HOSPITAL_COMMUNITY): Payer: Medicare Other

## 2015-11-07 DIAGNOSIS — Z7982 Long term (current) use of aspirin: Secondary | ICD-10-CM | POA: Diagnosis not present

## 2015-11-07 DIAGNOSIS — K869 Disease of pancreas, unspecified: Secondary | ICD-10-CM | POA: Diagnosis not present

## 2015-11-07 DIAGNOSIS — R1011 Right upper quadrant pain: Secondary | ICD-10-CM | POA: Diagnosis present

## 2015-11-07 DIAGNOSIS — K851 Biliary acute pancreatitis without necrosis or infection: Secondary | ICD-10-CM | POA: Diagnosis not present

## 2015-11-07 DIAGNOSIS — Z8673 Personal history of transient ischemic attack (TIA), and cerebral infarction without residual deficits: Secondary | ICD-10-CM | POA: Diagnosis not present

## 2015-11-07 DIAGNOSIS — I252 Old myocardial infarction: Secondary | ICD-10-CM | POA: Diagnosis not present

## 2015-11-07 DIAGNOSIS — K567 Ileus, unspecified: Secondary | ICD-10-CM | POA: Diagnosis present

## 2015-11-07 DIAGNOSIS — K269 Duodenal ulcer, unspecified as acute or chronic, without hemorrhage or perforation: Secondary | ICD-10-CM | POA: Diagnosis not present

## 2015-11-07 DIAGNOSIS — I5042 Chronic combined systolic (congestive) and diastolic (congestive) heart failure: Secondary | ICD-10-CM | POA: Diagnosis not present

## 2015-11-07 DIAGNOSIS — Z992 Dependence on renal dialysis: Secondary | ICD-10-CM | POA: Diagnosis not present

## 2015-11-07 DIAGNOSIS — I132 Hypertensive heart and chronic kidney disease with heart failure and with stage 5 chronic kidney disease, or end stage renal disease: Secondary | ICD-10-CM | POA: Diagnosis present

## 2015-11-07 DIAGNOSIS — K209 Esophagitis, unspecified: Secondary | ICD-10-CM | POA: Diagnosis not present

## 2015-11-07 DIAGNOSIS — R1013 Epigastric pain: Secondary | ICD-10-CM

## 2015-11-07 DIAGNOSIS — N186 End stage renal disease: Secondary | ICD-10-CM | POA: Diagnosis present

## 2015-11-07 DIAGNOSIS — E785 Hyperlipidemia, unspecified: Secondary | ICD-10-CM | POA: Diagnosis present

## 2015-11-07 DIAGNOSIS — A047 Enterocolitis due to Clostridium difficile: Secondary | ICD-10-CM | POA: Diagnosis not present

## 2015-11-07 DIAGNOSIS — I5022 Chronic systolic (congestive) heart failure: Secondary | ICD-10-CM | POA: Diagnosis not present

## 2015-11-07 DIAGNOSIS — E1122 Type 2 diabetes mellitus with diabetic chronic kidney disease: Secondary | ICD-10-CM

## 2015-11-07 DIAGNOSIS — I255 Ischemic cardiomyopathy: Secondary | ICD-10-CM | POA: Diagnosis present

## 2015-11-07 DIAGNOSIS — D631 Anemia in chronic kidney disease: Secondary | ICD-10-CM | POA: Diagnosis present

## 2015-11-07 DIAGNOSIS — K8010 Calculus of gallbladder with chronic cholecystitis without obstruction: Secondary | ICD-10-CM | POA: Diagnosis present

## 2015-11-07 DIAGNOSIS — K5660 Unspecified intestinal obstruction: Secondary | ICD-10-CM | POA: Diagnosis present

## 2015-11-07 DIAGNOSIS — K859 Acute pancreatitis without necrosis or infection, unspecified: Secondary | ICD-10-CM | POA: Diagnosis not present

## 2015-11-07 DIAGNOSIS — D696 Thrombocytopenia, unspecified: Secondary | ICD-10-CM | POA: Diagnosis present

## 2015-11-07 DIAGNOSIS — K219 Gastro-esophageal reflux disease without esophagitis: Secondary | ICD-10-CM | POA: Diagnosis present

## 2015-11-07 DIAGNOSIS — I2510 Atherosclerotic heart disease of native coronary artery without angina pectoris: Secondary | ICD-10-CM | POA: Diagnosis not present

## 2015-11-07 DIAGNOSIS — Z89612 Acquired absence of left leg above knee: Secondary | ICD-10-CM | POA: Diagnosis not present

## 2015-11-07 DIAGNOSIS — K8020 Calculus of gallbladder without cholecystitis without obstruction: Secondary | ICD-10-CM | POA: Diagnosis not present

## 2015-11-07 DIAGNOSIS — E8889 Other specified metabolic disorders: Secondary | ICD-10-CM | POA: Diagnosis present

## 2015-11-07 DIAGNOSIS — Z9049 Acquired absence of other specified parts of digestive tract: Secondary | ICD-10-CM | POA: Diagnosis not present

## 2015-11-07 DIAGNOSIS — E1142 Type 2 diabetes mellitus with diabetic polyneuropathy: Secondary | ICD-10-CM | POA: Diagnosis present

## 2015-11-07 DIAGNOSIS — Z87891 Personal history of nicotine dependence: Secondary | ICD-10-CM | POA: Diagnosis not present

## 2015-11-07 LAB — COMPREHENSIVE METABOLIC PANEL
ALBUMIN: 3.9 g/dL (ref 3.5–5.0)
ALK PHOS: 191 U/L — AB (ref 38–126)
ALT: 11 U/L — AB (ref 17–63)
ALT: 11 U/L — ABNORMAL LOW (ref 17–63)
AST: 20 U/L (ref 15–41)
AST: 20 U/L (ref 15–41)
Albumin: 3.6 g/dL (ref 3.5–5.0)
Alkaline Phosphatase: 195 U/L — ABNORMAL HIGH (ref 38–126)
Anion gap: 13 (ref 5–15)
Anion gap: 16 — ABNORMAL HIGH (ref 5–15)
BUN: 23 mg/dL — AB (ref 6–20)
BUN: 28 mg/dL — AB (ref 6–20)
CHLORIDE: 96 mmol/L — AB (ref 101–111)
CHLORIDE: 97 mmol/L — AB (ref 101–111)
CO2: 24 mmol/L (ref 22–32)
CO2: 24 mmol/L (ref 22–32)
CREATININE: 5.81 mg/dL — AB (ref 0.61–1.24)
Calcium: 8.4 mg/dL — ABNORMAL LOW (ref 8.9–10.3)
Calcium: 8.5 mg/dL — ABNORMAL LOW (ref 8.9–10.3)
Creatinine, Ser: 5.37 mg/dL — ABNORMAL HIGH (ref 0.61–1.24)
GFR calc Af Amer: 10 mL/min — ABNORMAL LOW (ref >60)
GFR calc Af Amer: 11 mL/min — ABNORMAL LOW (ref >60)
GFR calc non Af Amer: 9 mL/min — ABNORMAL LOW (ref >60)
GFR, EST NON AFRICAN AMERICAN: 8 mL/min — AB (ref >60)
GLUCOSE: 142 mg/dL — AB (ref 65–99)
Glucose, Bld: 171 mg/dL — ABNORMAL HIGH (ref 65–99)
POTASSIUM: 4.2 mmol/L (ref 3.5–5.1)
Potassium: 4.2 mmol/L (ref 3.5–5.1)
SODIUM: 136 mmol/L (ref 135–145)
Sodium: 134 mmol/L — ABNORMAL LOW (ref 135–145)
Total Bilirubin: 1.2 mg/dL (ref 0.3–1.2)
Total Bilirubin: 1.5 mg/dL — ABNORMAL HIGH (ref 0.3–1.2)
Total Protein: 8 g/dL (ref 6.5–8.1)
Total Protein: 8.3 g/dL — ABNORMAL HIGH (ref 6.5–8.1)

## 2015-11-07 LAB — GLUCOSE, CAPILLARY
GLUCOSE-CAPILLARY: 141 mg/dL — AB (ref 65–99)
GLUCOSE-CAPILLARY: 154 mg/dL — AB (ref 65–99)
GLUCOSE-CAPILLARY: 126 mg/dL — AB (ref 65–99)

## 2015-11-07 LAB — CBC
HCT: 35.6 % — ABNORMAL LOW (ref 39.0–52.0)
Hemoglobin: 11.1 g/dL — ABNORMAL LOW (ref 13.0–17.0)
MCH: 26.2 pg (ref 26.0–34.0)
MCHC: 31.2 g/dL (ref 30.0–36.0)
MCV: 84 fL (ref 78.0–100.0)
PLATELETS: 103 10*3/uL — AB (ref 150–400)
RBC: 4.24 MIL/uL (ref 4.22–5.81)
RDW: 15.8 % — AB (ref 11.5–15.5)
WBC: 4.4 10*3/uL (ref 4.0–10.5)

## 2015-11-07 LAB — CBC WITH DIFFERENTIAL/PLATELET
BASOS ABS: 0 10*3/uL (ref 0.0–0.1)
BASOS PCT: 0 %
EOS PCT: 0 %
Eosinophils Absolute: 0 10*3/uL (ref 0.0–0.7)
HEMATOCRIT: 35.4 % — AB (ref 39.0–52.0)
Hemoglobin: 11.3 g/dL — ABNORMAL LOW (ref 13.0–17.0)
Lymphocytes Relative: 8 %
Lymphs Abs: 0.4 10*3/uL — ABNORMAL LOW (ref 0.7–4.0)
MCH: 26.4 pg (ref 26.0–34.0)
MCHC: 31.9 g/dL (ref 30.0–36.0)
MCV: 82.7 fL (ref 78.0–100.0)
MONO ABS: 0.5 10*3/uL (ref 0.1–1.0)
Monocytes Relative: 11 %
NEUTROS ABS: 3.6 10*3/uL (ref 1.7–7.7)
Neutrophils Relative %: 81 %
PLATELETS: 93 10*3/uL — AB (ref 150–400)
RBC: 4.28 MIL/uL (ref 4.22–5.81)
RDW: 15.6 % — AB (ref 11.5–15.5)
WBC: 4.4 10*3/uL (ref 4.0–10.5)

## 2015-11-07 LAB — MAGNESIUM: Magnesium: 2.2 mg/dL (ref 1.7–2.4)

## 2015-11-07 LAB — LIPASE, BLOOD: Lipase: 105 U/L — ABNORMAL HIGH (ref 11–51)

## 2015-11-07 LAB — PHOSPHORUS: Phosphorus: 2.4 mg/dL — ABNORMAL LOW (ref 2.5–4.6)

## 2015-11-07 LAB — MRSA PCR SCREENING: MRSA by PCR: NEGATIVE

## 2015-11-07 MED ORDER — ONDANSETRON HCL 4 MG/2ML IJ SOLN
4.0000 mg | Freq: Four times a day (QID) | INTRAMUSCULAR | Status: DC | PRN
  Administered 2015-11-07 (×2): 4 mg via INTRAVENOUS
  Filled 2015-11-07 (×2): qty 2

## 2015-11-07 MED ORDER — B COMPLEX-C PO TABS: 1.0000 | ORAL_TABLET | Freq: Every day | ORAL | Status: DC

## 2015-11-07 MED ORDER — PANTOPRAZOLE SODIUM 40 MG PO TBEC
40.0000 mg | DELAYED_RELEASE_TABLET | Freq: Every day | ORAL | Status: DC
Start: 2015-11-07 — End: 2015-11-07

## 2015-11-07 MED ORDER — SODIUM CHLORIDE 0.9 % IV SOLN
INTRAVENOUS | Status: DC
  Administered 2015-11-07: 14:00:00 via INTRAVENOUS

## 2015-11-07 MED ORDER — ASPIRIN 81 MG PO CHEW: 81.0000 mg | CHEWABLE_TABLET | Freq: Every day | ORAL | Status: DC

## 2015-11-07 MED ORDER — HYDROCODONE-ACETAMINOPHEN 10-325 MG PO TABS: 1.0000 | ORAL_TABLET | Freq: Four times a day (QID) | ORAL | Status: DC | PRN

## 2015-11-07 MED ORDER — ACETAMINOPHEN 325 MG PO TABS: 650.0000 mg | ORAL_TABLET | Freq: Four times a day (QID) | ORAL | Status: DC | PRN

## 2015-11-07 MED ORDER — DEXTROSE-NACL 5-0.45 % IV SOLN: INTRAVENOUS | Status: DC

## 2015-11-07 MED ORDER — ACETAMINOPHEN 650 MG RE SUPP: 650.0000 mg | RECTAL | Status: DC | PRN

## 2015-11-07 MED ORDER — ALBUTEROL SULFATE (2.5 MG/3ML) 0.083% IN NEBU: 2.5000 mg | INHALATION_SOLUTION | RESPIRATORY_TRACT | Status: DC | PRN

## 2015-11-07 MED ORDER — ONDANSETRON HCL 4 MG PO TABS: 4.0000 mg | ORAL_TABLET | Freq: Four times a day (QID) | ORAL | Status: DC | PRN

## 2015-11-07 MED ORDER — DEXTROSE 5 % IV SOLN
2.0000 g | INTRAVENOUS | Status: DC
  Filled 2015-11-07: qty 2

## 2015-11-07 MED ORDER — SEVELAMER CARBONATE 800 MG PO TABS: 800.0000 mg | ORAL_TABLET | Freq: Every day | ORAL | Status: DC

## 2015-11-07 MED ORDER — PIPERACILLIN-TAZOBACTAM IN DEX 2-0.25 GM/50ML IV SOLN
2.2500 g | Freq: Three times a day (TID) | INTRAVENOUS | Status: DC
  Administered 2015-11-07 – 2015-11-08 (×5): 2.25 g via INTRAVENOUS
  Filled 2015-11-07 (×7): qty 50

## 2015-11-07 MED ORDER — CEFTRIAXONE SODIUM 2 G IJ SOLR
2.0000 g | Freq: Once | INTRAMUSCULAR | Status: AC
  Administered 2015-11-07: 2 g via INTRAVENOUS
  Filled 2015-11-07: qty 2

## 2015-11-07 MED ORDER — PRO-STAT SUGAR FREE PO LIQD: 30.0000 mL | Freq: Every day | ORAL | Status: DC

## 2015-11-07 MED ORDER — ATORVASTATIN CALCIUM 20 MG PO TABS: 20.0000 mg | ORAL_TABLET | Freq: Every day | ORAL | Status: DC

## 2015-11-07 MED ORDER — GABAPENTIN 100 MG PO CAPS: 100.0000 mg | ORAL_CAPSULE | Freq: Every day | ORAL | Status: DC

## 2015-11-07 MED ORDER — SEVELAMER CARBONATE 800 MG PO TABS: 800.0000 mg | ORAL_TABLET | Freq: Two times a day (BID) | ORAL | Status: DC

## 2015-11-07 MED ORDER — DOCUSATE SODIUM 100 MG PO CAPS: 100.0000 mg | ORAL_CAPSULE | Freq: Every day | ORAL | Status: DC

## 2015-11-07 MED ORDER — MIDODRINE HCL 5 MG PO TABS
5.0000 mg | ORAL_TABLET | Freq: Three times a day (TID) | ORAL | Status: DC
  Filled 2015-11-07: qty 1

## 2015-11-07 MED ORDER — PANTOPRAZOLE SODIUM 40 MG IV SOLR
40.0000 mg | INTRAVENOUS | Status: DC
  Administered 2015-11-07 – 2015-11-10 (×4): 40 mg via INTRAVENOUS
  Filled 2015-11-07 (×5): qty 40

## 2015-11-07 MED ORDER — HYDROMORPHONE HCL 1 MG/ML IJ SOLN
1.0000 mg | INTRAMUSCULAR | Status: DC | PRN
  Administered 2015-11-07 – 2015-11-08 (×4): 1 mg via INTRAVENOUS
  Filled 2015-11-07 (×4): qty 1

## 2015-11-07 NOTE — Progress Notes (Signed)
Attempted NG placement with no success.  Patient vomited brownish green liquid.

## 2015-11-07 NOTE — Consult Note (Signed)
Referring Provider: No ref. provider found Primary Care Physician:  [name], MD Primary Gastroenterologist:  Dr. Hilarie Fredrickson  Reason for Consultation:  ? Gallstone pancreatitis  HPI: Sean [name] is a 80 y.o. male with medical history significant of ischemic colitis s/p partial colectomy, cholelithiasis, DM2, ESRD on HD, s/p Left AKA, hypotension, HLD, anemia, depression.  He presents with one-month history of abdominal pain. Patient describes pain as "griping" is located along his entire mid-abdomen with the majority of the pain in the peri-umbilical area.  Waxes and wanes, but became very severe, which is what prompted his ED visit.  He says that the pain feels similar as to when he had a small bowel obstruction in the past.  He's had some nausea but minimal to no vomiting.  Moves his bowels although says that it is "a struggle".  says that he has been passing a lot of gas/flatus.  He was found to have acute cholecystitis in 2015 but was deemed a poor surgical candidate for which he underwent a percutaneous drain with interventional radiology.  ED Course: On admission patient evaluated and seen to have elevated lipase of 105 with alkaline phosphatase of 195. Ultrasound revealed stones in gallbladder sludge, but no acute dilation of the gallbladder duct. Dr. Kathrynn Humble consulted surgery who felt patient may be a poor surgical candidate and recommended consultation GI. Dr. Cristina Gong of Gastroenterology recommended obtaining a CT of the abdomen without contrast.  CT scan showed SBO with transition point in right mid-abdomen.  Also showed some soft tissue fullness about the entire pancreas and mild peripancreatic edema with diffuse mesenteric edema as well.  When I saw the patient the nursing staff was attempting to place NGT, which induced vomiting and was terminated/unsuccessful.  Colonoscopy 07/2014 by Dr. Hilarie Fredrickson done for recurrent findings of "colitis" on CT scan.  Colonoscopy revealed  anastomosis in right colon with inflammation and ulceration (?ischemia vs IBD).  Biopsies showed colonic mucosa with reactive changes and granulation tissue.   Past Medical History  Diagnosis Date  . Hyperparathyroidism   . Arthritis   . Hyperlipidemia   . Hypertension   . Neuromuscular disorder ([place])   . Stroke ([place])   . GERD (gastroesophageal reflux disease)   . Migraine   . Heart attack ([place]) 2011; 1990's  . Peripheral neuropathy ([place])   . Anxiety   . Depression   . Dialysis patient [facility])     M, W, F; [place] CAD (coronary artery disease)     a. s/p NSTEMI and failed RCA PCI in 2011. b. Nuc 12/2013: nonischemic, prior inferior MI.  Marland Kitchen LBBB (left bundle branch block)   . Calciphylaxis     a. Multiple admissions for nonhealing L ulcer/sepsis, s/p L AKA 02/02/2014.  Marland Kitchen Chronic anemia   . Dementia   . ESRD (end stage renal disease) on dialysis ([place])   . Renal insufficiency   . Generalized weakness   . Hypotension     renovascular  . History of left bundle branch block   . Type II diabetes mellitus ([place])     not taking any medications.  . Cholelithiasis   . Hepatic steatosis     Past Surgical History  Procedure Laterality Date  . Parathyroidectomy  2011  . Capd insertion  09/12/2011    Procedure: LAPAROSCOPIC INSERTION CONTINUOUS AMBULATORY PERITONEAL DIALYSIS  (CAPD) CATHETER;  Surgeon: Edward Jolly, MD;  Location: [place];  Service: General;  Laterality: N/A;  . Sp av dialysis  shunt access existing *l*  2003  . Dg av dialysis shunt intro ndl*r* or      "not working" (09/19/11)  . Capd removal  03/12/2012    Procedure: CONTINUOUS AMBULATORY PERITONEAL DIALYSIS  (CAPD) CATHETER REMOVAL;  Surgeon: Edward Jolly, MD;  Location: [place];  Service: General;  Laterality: N/A;  . Amputation Left 02/02/2014    Procedure: AMPUTATION ABOVE KNEE-LEFT;  Surgeon: Elam Dutch, MD;  Location: [place];  Service: Vascular;  Laterality: Left;  . I&d extremity Left  03/31/2014    Procedure: IRRIGATION AND DEBRIDEMENT EXTREMITY - LEFT ABOVE KNEE AMPUTATION;  Surgeon: Elam Dutch, MD;  Location: [place];  Service: Vascular;  Laterality: Left;  . Application of wound vac Left 03/31/2014    Procedure: APPLICATION OF WOUND VAC - LEFT AKA;  Surgeon: Elam Dutch, MD;  Location: [place];  Service: Vascular;  Laterality: Left;  . Colon surgery  2011    colon resection   . Colonoscopy with propofol N/A 07/14/2014    Procedure: COLONOSCOPY WITH PROPOFOL;  Surgeon: Jerene Bears, MD;  Location: [facility];  Service: Gastroenterology;  Laterality: N/A;    Prior to Admission medications   Medication Sig Start Date End Date Taking? Authorizing Provider  acetaminophen (TYLENOL) 325 MG tablet Take 650 mg by mouth every 6 (six) hours as needed for mild pain, fever or headache (DO NOT EXCEED 4 GM OF APAP IN 24 HOURS FORM ALL SOURCES).    Yes Historical Provider, MD  Amino Acids-Protein Hydrolys (FEEDING SUPPLEMENT, PRO-STAT SUGAR FREE 64,) LIQD Take 30 mLs by mouth daily.   Yes Historical Provider, MD  aspirin 81 MG chewable tablet Chew 81 mg by mouth daily.    Yes Historical Provider, MD  atorvastatin (LIPITOR) 20 MG tablet Take 20 mg by mouth at bedtime.    Yes Historical Provider, MD  b-complex w/C (TOTAL B/C) TABS tablet Take 1 tablet by mouth at bedtime.   Yes Historical Provider, MD  docusate sodium (COLACE) 100 MG capsule Take 100 mg by mouth daily.   Yes Historical Provider, MD  gabapentin (NEURONTIN) 100 MG capsule Take 100 mg by mouth at bedtime.    Yes Historical Provider, MD  HYDROcodone-acetaminophen (NORCO) 10-325 MG tablet Take one tablet by mouth every 8 hours as needed for moderate pain. Do not exceed 4gm of Tylenol in 24 hours 09/15/15  Yes Gildardo Cranker, DO  midodrine (PROAMATINE) 5 MG tablet Take 5 mg by mouth 3 (three) times daily. Do not take 6 am dose on M- W- F- d/t  dialysis days. 02/01/15  Yes Historical Provider, MD  ondansetron (ZOFRAN) 4 MG  tablet Take 4 mg by mouth every 6 (six) hours as needed for nausea.   Yes Historical Provider, MD  sevelamer carbonate (RENVELA) 800 MG tablet Take 800 mg by mouth daily with lunch. 12 pm and 5 pm.   Yes Historical Provider, MD  sorbitol 70 % solution Take 30 mLs by mouth daily as needed (severe constipation).   Yes Historical Provider, MD    Current Facility-Administered Medications  Medication Dose Route Frequency Provider Last Rate Last Dose  . acetaminophen (TYLENOL) suppository 650 mg  650 mg Rectal Q4H PRN Lavina Hamman, MD      . albuterol (PROVENTIL) (2.5 MG/3ML) 0.083% nebulizer solution 2.5 mg  2.5 mg Nebulization Q2H PRN Norval Morton, MD      . HYDROmorphone (DILAUDID) injection 1 mg  1 mg Intravenous Q3H PRN Rondell Charmayne Sheer,  MD   1 mg at 11/07/15 0427  . ondansetron (ZOFRAN) tablet 4 mg  4 mg Oral Q6H PRN Norval Morton, MD       Or  . ondansetron (ZOFRAN) injection 4 mg  4 mg Intravenous Q6H PRN Norval Morton, MD   4 mg at 11/07/15 0427  . pantoprazole (PROTONIX) injection 40 mg  40 mg Intravenous Q24H Lavina Hamman, MD      . piperacillin-tazobactam (ZOSYN) IVPB 2.25 g  2.25 g Intravenous Q8H Alyson Gillian Shields, [facility]        Allergies as of 11/06/2015 - Review Complete 11/06/2015  Allergen Reaction Noted  . Benazepril hcl Hives, Itching, and Rash     Family History  Problem Relation Age of Onset  . Cancer Sister     breast  . Cancer Brother     lung  . Anesthesia problems Neg Hx   . Hypotension Neg Hx   . Malignant hyperthermia Neg Hx   . Pseudochol deficiency Neg Hx     Social History   Social History  . Marital Status: Married    Spouse Name: N/A  . Number of Children: N/A  . Years of Education: N/A   Occupational History  . Not on file.   Social History Main Topics  . Smoking status: Former Smoker -- 0.25 packs/day for 7 years    Types: Cigarettes    Quit date: 06/12/1976  . Smokeless tobacco: Never Used  . Alcohol Use: No  . Drug Use: No  .  Sexual Activity: No   Other Topics Concern  . Not on file   Social History Narrative    Review of Systems: Ten point ROS is O/W negative except as mentioned in HPI.  Physical Exam: Vital signs in last 24 hours: Temp:  [98.1 F (36.7 C)-98.2 F (36.8 C)] 98.2 F (36.8 C) (05/28 0336) Pulse Rate:  [88-98] 98 (05/28 0336) Resp:  [11-21] 19 (05/28 0336) BP: (137-193)/(75-96) 193/96 mmHg (05/28 0336) SpO2:  [93 %-100 %] 100 % (05/28 0336) Weight:  [180 lb 12.8 oz (82.01 kg)-187 lb 2.7 oz (84.9 kg)] 187 lb 2.7 oz (84.9 kg) (05/28 0336) Last BM Date: 11/06/15 General:  Alert, Well-developed, well-nourished, pleasant and cooperative in NAD Head:  Normocephalic and atraumatic. Eyes:  Sclera clear, no icterus.  Conjunctiva pink. Ears:  Normal auditory acuity. Mouth:  No deformity or lesions.   Lungs:  Clear throughout to auscultation.  No wheezes, crackles, or rhonchi.  Heart:  Regular rate and rhythm; no murmurs, clicks, rubs, or gallops. Abdomen:  Soft, mildly distended.  BS present but very quiet/hypoactive.  Diffuse TTP but > in the mid-abdomen. Rectal:  Deferred  Msk:  Symmetrical without gross deformities.  Left AKA. Pulses:  Normal pulses noted. Extremities:  Without clubbing or edema.  Left AKA. Neurologic:  Alert and  oriented x4;  grossly normal neurologically. Skin:  Intact without significant lesions or rashes. Psych:  Alert and cooperative. Normal mood and affect.  Intake/Output from previous day: 05/27 0701 - 05/28 0700 In: 50 [IV Piggyback:50] Out: 0   Lab Results:  Recent Labs  11/06/15 2350 11/07/15 0523  WBC 4.4 4.4  HGB 11.3* 11.1*  HCT 35.4* 35.6*  PLT 93* 103*   BMET  Recent Labs  11/06/15 2350 11/07/15 0523  NA 134* 136  K 4.2 4.2  CL 97* 96*  CO2 24 24  GLUCOSE 142* 171*  BUN 23* 28*  CREATININE 5.37* 5.81*  CALCIUM 8.4* 8.5*  LFT  Recent Labs  11/07/15 0523  PROT 8.0  ALBUMIN 3.6  AST 20  ALT 11*  ALKPHOS 191*  BILITOT  1.5*   Studies/Results: Ct Abdomen Pelvis Wo Contrast  11/07/2015  CLINICAL DATA:  Abdominal pain, elevated lipase. Right upper quadrant pain. EXAM: CT ABDOMEN AND PELVIS WITHOUT CONTRAST TECHNIQUE: Multidetector CT imaging of the abdomen and pelvis was performed following the standard protocol without IV contrast. COMPARISON:  Ultrasound earlier this day.  CT 05/06/2014 FINDINGS: Lower chest: Extensive coronary artery calcifications. Calcifications at the lung hila, partially included. Probable bilateral calcified hilar lymph nodes, suboptimally assessed. Atelectasis at the lung bases. Liver: No evidence of focal lesion allowing for lack contrast. Hepatobiliary: Distended gallbladder containing dependent gallstones. Previously assessed by ultrasound. Pancreas: Suboptimally assessed without contrast, however there is diffuse soft tissue fullness. Mild peripancreatic edema. Spleen: Enlarged measuring 15.4 cm craniocaudal. Adrenal glands: Fullness bilaterally.  No discrete nodule. Kidneys: Atrophic parenchyma. Multiple bilateral renal cysts, with a large parapelvic cyst on the left. Some of the cysts appear complicated. Extensive renovascular calcifications. Stomach/Bowel: Stomach distended with ingested contents. Dilated fluid-filled proximal small bowel with transition in the mid right abdomen image 55 series 2. There is mesenteric edema and free fluid. No pneumatosis. More distal small bowel loops are decompressed. Enteric sutures noted at the base of the cecum. There is cecal and ascending colonic wall thickening. Small volume of stool throughout the remainder the colon. Vascular/Lymphatic: Dense atherosclerosis of the abdominal aorta. No aneurysm. No definite retroperitoneal adenopathy. Reproductive: Prominent sized prostate gland causing mass effect on the bladder base. Bladder: Decompressed. Other: Small volume of free fluid in the abdomen and pelvis. No evidence of free air. Postsurgical change in the  anterior abdominal wall with small fat containing periumbilical ventral abdominal wall hernia. Musculoskeletal: There are no acute or suspicious osseous abnormalities. 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 Bony fusion of the sacroiliac joints. Degenerative change throughout spine. IMPRESSION: 1. Small-bowel obstruction with transition point in the right mid abdomen. 2. Soft tissue fullness about the entire pancreas. There is mild peripancreatic edema, however diffuse mesenteric edema is also seen. Underlying pancreatic mass cannot be excluded. If patient is able tolerate breath hold technique, MRI may be of value. 3. Splenomegaly. 4. Cholelithiasis, please reference same-day right upper quadrant ultrasound. 5. Advanced atherosclerosis. 6. Additional chronic findings as described. Electronically Signed   By: Jeb Levering M.D.   On: 11/07/2015 05:55   US Abdomen Limited Ruq  11/07/2015  CLINICAL DATA:  Chronic periumbilical pain for 1 month. Initial encounter. EXAM: US ABDOMEN LIMITED - RIGHT UPPER QUADRANT COMPARISON:  CT of the abdomen and pelvis from 05/03/2014 FINDINGS: Gallbladder: Stones and sludge are noted dependently within the gallbladder. The gallbladder is otherwise unremarkable. No significant gallbladder wall thickening or pericholecystic fluid is seen. No ultrasonographic Murphy's sign is elicited. Common bile duct: Diameter: 0.6 cm, within normal limits in caliber. Liver: No focal lesion identified. Within normal limits in parenchymal echogenicity. IMPRESSION: 1. No acute abnormality seen at the right upper quadrant. 2. Stones and sludge seen dependently within the gallbladder. Gallbladder otherwise unremarkable in appearance. Electronically Signed   By: Garald Balding M.D.   On: 11/07/2015 01:20   IMPRESSION:  -SBO:  Surgery being consulted as well.  NGT placement was attempted by nursing staff but unsuccessful.  He is not vomiting currently so will defer to surgery  and medicine whether to have it placed by IR. -? Gallstone pancreatitis:  Has gallstones and sludge on ultrasound.  No biliary dilation.  Had acute cholecystitis in 2015 at which time he was treated with perc chole because he was a poor surgical candidate.  Lipase only minimally elevated, but there are some pancreatic findings on CT scan. -History of ischemic colitis requiring surgery/partial colectomy -ESRD on HD  -Multiple medical problems  PLAN: -Conservative measures for usual treatment of pancreatitis and SBO.  ? NGT, anti-emetics, pain control, IVF's as able in this dialysis patient. -? Eventual MRI abdomen.  ZEHR, JESSICA D.  11/07/2015, 8:51 AM  Pager number [phone]   ________________________________________________________________________  Velora Heckler GI MD note:  I personally examined the patient, reviewed the data and agree with the assessment and plan described above.  I do think he has mild acute pancreatitis, probably gallstone related (non-IV contrast CT shows peripancreatic edema and lipase is elevated). He also has partial SBO (clearly dilated on CT with transition point) but currently he has BS and has been passing gas. No vomiting except when staff was attempting NG tube.  For now, would treat conservatively with IV fluids, observation. If he starts to vomit then NG tube will have to be reattempted.  Surgical consult to consider cholecystectomy (+/- SBO release pending his clinical course).  I will order KUB for AM.   Owens Loffler, MD [facility] Gastroenterology Pager [phone]

## 2015-11-07 NOTE — Progress Notes (Addendum)
Pharmacy Antibiotic Note  Sean [name] is a 80 y.o. male admitted on 11/06/2015 with Intra-abdominal Infection/small bowel obstruction.  Pharmacy has been consulted for Zosyn dosing.  Pt is ESRD on HD, WBC 4.4, afeb, CT showed small bowel obstruction  Plan: Zosyn 2.25g IV q8h (4 hour infusion). Will continue to follow renal function, culture results, LOT, and antibiotic de-escalation plans    Height: 5\' 5"  (165.1 cm) Weight: 187 lb 2.7 oz (84.9 kg) IBW/kg (Calculated) : 61.5  Temp (24hrs), Avg:98.2 F (36.8 C), Min:98.1 F (36.7 C), Max:98.2 F (36.8 C)   Recent Labs Lab 11/06/15 2350 11/06/15 2354 11/07/15 0523  WBC 4.4  --  4.4  CREATININE 5.37*  --  5.81*  LATICACIDVEN  --  1.93  --     Estimated Creatinine Clearance: 10.5 mL/min (by C-G formula based on Cr of 5.81).    Allergies  Allergen Reactions  . Benazepril Hcl Hives, Itching and Rash    flareups on patient head    Antimicrobials this admission: 5/28 CTX x1 5/28 Zosyn >>   Dose adjustments this admission: N/A  Microbiology results: 5/28 MRSA PCR: Sent   Thank you for allowing pharmacy to be a part of this patient's care.  Darl Pikes, PharmD Clinical Pharmacist- Resident Pager: [phone]  Darl Pikes 11/07/2015 8:24 AM

## 2015-11-07 NOTE — Progress Notes (Signed)
[facility] PLAN OF CARE NOTE Patient: PAM SEATS [id number]   PCP: [name], MD DOB: 01/29/1936   DOA: 11/06/2015   DOS: 11/07/2015    Patient was admitted by my colleague Dr.Smith earlier on 11/07/2015. I have reviewed the H&P as well as assessment and plan and agree with the same. Important changes in the plan are listed below.  Plan of care:   Gallstone pancreatitis   Abdominal pain, epigastric Appreciated GI as well as general surgery input. Remains nothing by mouth, when necessary Dilaudid,  If the patient is hypotensive continue gentle hydration.    Small bowel obstruction versus ileus   Appreciate input from Gen. surgery. Attempted NG tube but patient cannot tolerate. No vomiting prior to NG insertion. We'll continue to monitor. Discontinue oral medication.    End stage renal disease on dialysis [facility]) MWF Nephrology consult for hemodialysis.  Author: Berle Mull, MD [facility] Pager: [phone] 11/07/2015 4:35 PM   If 7PM-7AM, please contact night-coverage at [email or web address], password [email or web address]

## 2015-11-07 NOTE — ED Notes (Signed)
IV placement unsuccessful. Pt has restricted left arm, old fistula in right arm. IV team consult put in

## 2015-11-07 NOTE — H&P (Addendum)
History and Physical    Sean [name] [id number] DOB: [birth year] DOA: 11/06/2015  Referring MD/NP/PA: Dr. Kathrynn Humble PCP: [name], MD  Patient coming from: [place] facility  Chief Complaint: Abdominal pain  HPI: Sean [name] is a 80 y.o. male with medical history significant of ischemic colitis s/p bowel resection, cholelithiasis, DM2, ESRD on HD, caciphylaxis, s/p Left AKA, hypotension, HLD, anemia, DM2, depression; who presents with one-month history of abdominal pain. Patient describes pain as "griping" is located more so epigastrically with waxing and waning severity. Following eating a meal symptoms appeared to worsen Pain symptoms previously would just occur intermittently, but symptoms worsened today and  the pain has been more constant. Associated symptoms include constipation, heartburn, nausea. Patient notes that he previously had amputation of his left lower extremity back in 2015 for which she was also found to have acute cholecystitis at that time but was deemed a poor surgical candidate for which he underwent a percutaneous drain with interventional radiology. Patient denies any vomiting, diarrhea, blood in stool/urine.   ED Course:  On admission patient evaluated and seen to have elevated lipase of 105 with alkaline phosphatase of 195. Ultrasound revealed stones in gallbladder sludge, but no acute dilation of the gallbladder duct. Dr. Kathrynn Humble consulted surgery who felt patient may be a poor surgical candidate and recommended consultation GI.  Dr. Cristina Gong of Gastroenterology recommended obtaining a CT of the abdomen without contrast.  Review of Systems: As per HPI otherwise 10 point review of systems negative.   Past Medical History  Diagnosis Date  . Hyperparathyroidism   . Arthritis   . Hyperlipidemia   . Hypertension   . Neuromuscular disorder ([place])   . Stroke ([place])   . GERD (gastroesophageal reflux disease)   . Migraine   . Heart attack ([place]) 2011;  1990's  . Peripheral neuropathy ([place])   . Anxiety   . Depression   . Dialysis patient [facility])     M, W, F; [place] CAD (coronary artery disease)     a. s/p NSTEMI and failed RCA PCI in 2011. b. Nuc 12/2013: nonischemic, prior inferior MI.  Marland Kitchen LBBB (left bundle branch block)   . Calciphylaxis     a. Multiple admissions for nonhealing L ulcer/sepsis, s/p L AKA 02/02/2014.  Marland Kitchen Chronic anemia   . Dementia   . ESRD (end stage renal disease) on dialysis ([place])   . Renal insufficiency   . Generalized weakness   . Hypotension     renovascular  . History of left bundle branch block   . Type II diabetes mellitus ([place])     not taking any medications.  . Cholelithiasis   . Hepatic steatosis     Past Surgical History  Procedure Laterality Date  . Parathyroidectomy  2011  . Capd insertion  09/12/2011    Procedure: LAPAROSCOPIC INSERTION CONTINUOUS AMBULATORY PERITONEAL DIALYSIS  (CAPD) CATHETER;  Surgeon: Edward Jolly, MD;  Location: [place];  Service: General;  Laterality: N/A;  . Sp av dialysis shunt access existing *l*  2003  . Dg av dialysis shunt intro ndl*r* or      "not working" (09/19/11)  . Capd removal  03/12/2012    Procedure: CONTINUOUS AMBULATORY PERITONEAL DIALYSIS  (CAPD) CATHETER REMOVAL;  Surgeon: Edward Jolly, MD;  Location: [place];  Service: General;  Laterality: N/A;  . Amputation Left 02/02/2014    Procedure: AMPUTATION ABOVE KNEE-LEFT;  Surgeon: Elam Dutch, MD;  Location: [place];  Service: Vascular;  Laterality: Left;  . I&d extremity Left 03/31/2014    Procedure: IRRIGATION AND DEBRIDEMENT EXTREMITY - LEFT ABOVE KNEE AMPUTATION;  Surgeon: Elam Dutch, MD;  Location: [place];  Service: Vascular;  Laterality: Left;  . Application of wound vac Left 03/31/2014    Procedure: APPLICATION OF WOUND VAC - LEFT AKA;  Surgeon: Elam Dutch, MD;  Location: [place];  Service: Vascular;  Laterality: Left;  . Colon surgery  2011    colon resection   .  Colonoscopy with propofol N/A 07/14/2014    Procedure: COLONOSCOPY WITH PROPOFOL;  Surgeon: Jerene Bears, MD;  Location: [facility];  Service: Gastroenterology;  Laterality: N/A;     reports that he quit smoking about 39 years ago. His smoking use included Cigarettes. He has a 1.75 pack-year smoking history. He has never used smokeless tobacco. He reports that he does not drink alcohol or use illicit drugs.  Allergies  Allergen Reactions  . Benazepril Hcl Hives, Itching and Rash    flareups on patient head    Family History  Problem Relation Age of Onset  . Cancer Sister     breast  . Cancer Brother     lung  . Anesthesia problems Neg Hx   . Hypotension Neg Hx   . Malignant hyperthermia Neg Hx   . Pseudochol deficiency Neg Hx     Prior to Admission medications   Medication Sig Start Date End Date Taking? Authorizing Provider  acetaminophen (TYLENOL) 325 MG tablet Take 650 mg by mouth every 6 (six) hours as needed for mild pain, fever or headache (DO NOT EXCEED 4 GM OF APAP IN 24 HOURS FORM ALL SOURCES).    Yes Historical Provider, MD  Amino Acids-Protein Hydrolys (FEEDING SUPPLEMENT, PRO-STAT SUGAR FREE 64,) LIQD Take 30 mLs by mouth daily.   Yes Historical Provider, MD  aspirin 81 MG chewable tablet Chew 81 mg by mouth daily.    Yes Historical Provider, MD  atorvastatin (LIPITOR) 20 MG tablet Take 20 mg by mouth at bedtime.    Yes Historical Provider, MD  b-complex w/C (TOTAL B/C) TABS tablet Take 1 tablet by mouth at bedtime.   Yes Historical Provider, MD  docusate sodium (COLACE) 100 MG capsule Take 100 mg by mouth daily.   Yes Historical Provider, MD  gabapentin (NEURONTIN) 100 MG capsule Take 100 mg by mouth at bedtime.    Yes Historical Provider, MD  HYDROcodone-acetaminophen (NORCO) 10-325 MG tablet Take one tablet by mouth every 8 hours as needed for moderate pain. Do not exceed 4gm of Tylenol in 24 hours 09/15/15  Yes Gildardo Cranker, DO  midodrine (PROAMATINE) 5 MG tablet Take  5 mg by mouth 3 (three) times daily. Do not take 6 am dose on M- W- F- d/t  dialysis days. 02/01/15  Yes Historical Provider, MD  ondansetron (ZOFRAN) 4 MG tablet Take 4 mg by mouth every 6 (six) hours as needed for nausea.   Yes Historical Provider, MD  sevelamer carbonate (RENVELA) 800 MG tablet Take 800 mg by mouth daily with lunch. 12 pm and 5 pm.   Yes Historical Provider, MD  sorbitol 70 % solution Take 30 mLs by mouth daily as needed (severe constipation).   Yes Historical Provider, MD    Physical Exam: Filed Vitals:   11/06/15 2315 11/07/15 0000 11/07/15 0130 11/07/15 0200  BP: 173/94 180/96 138/75 137/84  Pulse: 88 89 96 95  Temp: 98.1 F (36.7 C)  TempSrc: Oral     Resp: 12 21 13 11   Height: 5\' 7"  (1.702 m)     Weight: 82.01 kg (180 lb 12.8 oz)     SpO2: 95% 98% 93% 94%      Constitutional: Obese elderly male in moderate distress chronically ill-appearing. Filed Vitals:   11/06/15 2315 11/07/15 0000 11/07/15 0130 11/07/15 0200  BP: 173/94 180/96 138/75 137/84  Pulse: 88 89 96 95  Temp: 98.1 F (36.7 C)     TempSrc: Oral     Resp: 12 21 13 11   Height: 5\' 7"  (1.702 m)     Weight: 82.01 kg (180 lb 12.8 oz)     SpO2: 95% 98% 93% 94%   Eyes: PERRL, lids and conjunctivae normal ENMT: Mucous membranes are moist. Posterior pharynx clear of any exudate or lesions.Normal dentition.  Neck: normal, supple, no masses, no thyromegaly Respiratory: clear to auscultation bilaterally, no wheezing, no crackles. Normal respiratory effort. No accessory muscle use.  Cardiovascular: Regular rate and rhythm, + murmur, no rubs / gallops. No extremity edema. 2+ pedal pulses. No carotid bruits.  Abdomen: no tenderness, no masses palpated. No hepatosplenomegaly. Bowel sounds positive.  Musculoskeletal: Left AKA Skin: no rashes, lesions, ulcers. No induration Neurologic: CN 2-12 grossly intact. Sensation intact, DTR normal. Strength 5/5 in all 4.  Psychiatric: Normal judgment and insight.  Alert and oriented x 3. Normal mood.     Labs on Admission: I have personally reviewed following labs and imaging studies  CBC:  Recent Labs Lab 11/06/15 2350  WBC 4.4  NEUTROABS 3.6  HGB 11.3*  HCT 35.4*  MCV 82.7  PLT 93*   Basic Metabolic Panel:  Recent Labs Lab 11/06/15 2350  NA 134*  K 4.2  CL 97*  CO2 24  GLUCOSE 142*  BUN 23*  CREATININE 5.37*  CALCIUM 8.4*  MG 2.2  PHOS 2.4*   GFR: Estimated Creatinine Clearance: 11.6 mL/min (by C-G formula based on Cr of 5.37). Liver Function Tests:  Recent Labs Lab 11/06/15 2350  AST 20  ALT 11*  ALKPHOS 195*  BILITOT 1.2  PROT 8.3*  ALBUMIN 3.9    Recent Labs Lab 11/06/15 2350  LIPASE 105*   No results for input(s): AMMONIA in the last 168 hours. Coagulation Profile: No results for input(s): INR, PROTIME in the last 168 hours. Cardiac Enzymes: No results for input(s): CKTOTAL, CKMB, CKMBINDEX, TROPONINI in the last 168 hours. BNP (last 3 results) No results for input(s): PROBNP in the last 8760 hours. HbA1C: No results for input(s): HGBA1C in the last 72 hours. CBG: No results for input(s): GLUCAP in the last 168 hours. Lipid Profile: No results for input(s): CHOL, HDL, LDLCALC, TRIG, CHOLHDL, LDLDIRECT in the last 72 hours. Thyroid Function Tests: No results for input(s): TSH, T4TOTAL, FREET4, T3FREE, THYROIDAB in the last 72 hours. Anemia Panel: No results for input(s): VITAMINB12, FOLATE, FERRITIN, TIBC, IRON, RETICCTPCT in the last 72 hours. Urine analysis: No results found for: COLORURINE, APPEARANCEUR, LABSPEC, PHURINE, GLUCOSEU, HGBUR, BILIRUBINUR, KETONESUR, PROTEINUR, UROBILINOGEN, NITRITE, LEUKOCYTESUR Sepsis Labs: No results found for this or any previous visit (from the past 240 hour(s)).   Radiological Exams on Admission: US Abdomen Limited Ruq  11/07/2015  CLINICAL DATA:  Chronic periumbilical pain for 1 month. Initial encounter. EXAM: US ABDOMEN LIMITED - RIGHT UPPER QUADRANT  COMPARISON:  CT of the abdomen and pelvis from 05/03/2014 FINDINGS: Gallbladder: Stones and sludge are noted dependently within the gallbladder. The gallbladder is otherwise unremarkable. No significant gallbladder  wall thickening or pericholecystic fluid is seen. No ultrasonographic Murphy's sign is elicited. Common bile duct: Diameter: 0.6 cm, within normal limits in caliber. Liver: No focal lesion identified. Within normal limits in parenchymal echogenicity. IMPRESSION: 1. No acute abnormality seen at the right upper quadrant. 2. Stones and sludge seen dependently within the gallbladder. Gallbladder otherwise unremarkable in appearance. Electronically Signed   By: Garald Balding M.D.   On: 11/07/2015 01:20      Assessment/Plan  Epigastric pain secondary to Pancreatitis/gallstone pancreatitis: Acute. Patient with complaints of progressively worsening abdominal pain over the last 1 month. Lipase elevated at 105 with elevated alkaline phosphatase. Ultrasound showing cholelithiasis. - Admit to a MedSurg bed - npo for now - Check EKG - Patient started on Rocephin pharmacy to dose - GI consulted Dr. Cristina Gong  by ED  - Follow-up CT of the abdomen without contrast recommended by GI  Addendum :Small bowel obstruction found on CT: Acute. Patient with previous history of bowel ischemia requiring resection. - Follow-up with surgery in a.m. Patient already NPO - We'll need to likely discontinue all by mouth meds  ESRD on HD M/W/F:  Patient with creatinine of 5.37 and BUN of 23 in no acute electrolyte derangements noted.  - Check I&O and daily weight - Left message on HD automated consult line. Will need to consult nephrology in a.m. if not seen. - Continue renvela, Midodrine prn HD  Anemia of chronic kidney disease - Follow-up repeat CBC in a.m.  Thrombocytopenia: Chronic. Platelet counts acutely 93 on admission - Continue to monitor  Hyperlipidemia - Continue atorvastatin  Diabetes mellitus  type 2: apparently well controlled with diet - Continue to monitor for now, and start insulin regimen if needed.  GERD: Pepcid given in ED - Protonix twice a day  DVT prophylaxis: SCD Code Status:  full  Family Communication: none Disposition Plan:  undetermined  Consults called:GI admission status: Observation   Norval Morton MD [facility] Pager [phone]  If 7PM-7AM, please contact night-coverage [email or web address] Password [facility]  11/07/2015, 2:43 AM

## 2015-11-07 NOTE — Consult Note (Signed)
Reason for Consult:GS pancreatitis and SBO Referring Physician: Dr Sean [name] is an 80 y.o. male.  HPI: 80yo AAM complex past medical history of ESRD HD MWF, CAD s/p MI 2011 and 12/2013, HTN, ischemic CMP with EF 35-40%, sCHF, DM2, hyperparathyroidism who underwent perc cholecystostomy tube placement for acute cholecystitis in 01/2014 who comes back in c/o 1 month of ongoing intermittent abd pain. Points consistently to midline incision around umbilical and supraumbilical location. Denies RUQ pain. Some nausea. No fever. +flatus.   Labs showed mild pancreatitis and mild AP elevation. CT showed pancreatitis and SBO. No signs of acute cholecystitis.   Past Medical History  Diagnosis Date  . Hyperparathyroidism   . Arthritis   . Hyperlipidemia   . Hypertension   . Neuromuscular disorder ([place])   . Stroke ([place])   . GERD (gastroesophageal reflux disease)   . Migraine   . Heart attack ([place]) 2011; 1990's  . Peripheral neuropathy ([place])   . Anxiety   . Depression   . Dialysis patient [facility])     M, W, F; [place] CAD (coronary artery disease)     a. s/p NSTEMI and failed RCA PCI in 2011. b. Nuc 12/2013: nonischemic, prior inferior MI.  Marland Kitchen LBBB (left bundle branch block)   . Calciphylaxis     a. Multiple admissions for nonhealing L ulcer/sepsis, s/p L AKA 02/02/2014.  Marland Kitchen Chronic anemia   . Dementia   . ESRD (end stage renal disease) on dialysis ([place])   . Renal insufficiency   . Generalized weakness   . Hypotension     renovascular  . History of left bundle branch block   . Type II diabetes mellitus ([place])     not taking any medications.  . Cholelithiasis   . Hepatic steatosis     Past Surgical History  Procedure Laterality Date  . Parathyroidectomy  2011  . Capd insertion  09/12/2011    Procedure: LAPAROSCOPIC INSERTION CONTINUOUS AMBULATORY PERITONEAL DIALYSIS  (CAPD) CATHETER;  Surgeon: Edward Jolly, MD;  Location: [place];  Service: General;  Laterality:  N/A;  . Sp av dialysis shunt access existing *l*  2003  . Dg av dialysis shunt intro ndl*r* or      "not working" (09/19/11)  . Capd removal  03/12/2012    Procedure: CONTINUOUS AMBULATORY PERITONEAL DIALYSIS  (CAPD) CATHETER REMOVAL;  Surgeon: Edward Jolly, MD;  Location: [place];  Service: General;  Laterality: N/A;  . Amputation Left 02/02/2014    Procedure: AMPUTATION ABOVE KNEE-LEFT;  Surgeon: Elam Dutch, MD;  Location: [place];  Service: Vascular;  Laterality: Left;  . I&d extremity Left 03/31/2014    Procedure: IRRIGATION AND DEBRIDEMENT EXTREMITY - LEFT ABOVE KNEE AMPUTATION;  Surgeon: Elam Dutch, MD;  Location: [place];  Service: Vascular;  Laterality: Left;  . Application of wound vac Left 03/31/2014    Procedure: APPLICATION OF WOUND VAC - LEFT AKA;  Surgeon: Elam Dutch, MD;  Location: [place];  Service: Vascular;  Laterality: Left;  . Colon surgery  2011    colon resection   . Colonoscopy with propofol N/A 07/14/2014    Procedure: COLONOSCOPY WITH PROPOFOL;  Surgeon: Jerene Bears, MD;  Location: [facility];  Service: Gastroenterology;  Laterality: N/A;    Family History  Problem Relation Age of Onset  . Cancer Sister     breast  . Cancer Brother     lung  . Anesthesia problems Neg Hx   .  Hypotension Neg Hx   . Malignant hyperthermia Neg Hx   . Pseudochol deficiency Neg Hx     Social History:  reports that he quit smoking about 39 years ago. His smoking use included Cigarettes. He has a 1.75 pack-year smoking history. He has never used smokeless tobacco. He reports that he does not drink alcohol or use illicit drugs.  Allergies:  Allergies  Allergen Reactions  . Benazepril Hcl Hives, Itching and Rash    flareups on patient head    Medications: I have reviewed the patient's current medications.  Results for orders placed or performed during the hospital encounter of 11/06/15 (from the past 48 hour(s))  CBC with Differential/Platelet     Status:  Abnormal   Collection Time: 11/06/15 11:50 PM  Result Value Ref Range   WBC 4.4 4.0 - 10.5 K/uL   RBC 4.28 4.22 - 5.81 MIL/uL   Hemoglobin 11.3 (L) 13.0 - 17.0 g/dL   HCT 35.4 (L) 39.0 - 52.0 %   MCV 82.7 78.0 - 100.0 fL   MCH 26.4 26.0 - 34.0 pg   MCHC 31.9 30.0 - 36.0 g/dL   RDW 15.6 (H) 11.5 - 15.5 %   Platelets 93 (L) 150 - 400 K/uL    Comment: SPECIMEN CHECKED FOR CLOTS PLATELET COUNT CONFIRMED BY SMEAR    Neutrophils Relative % 81 %   Neutro Abs 3.6 1.7 - 7.7 K/uL   Lymphocytes Relative 8 %   Lymphs Abs 0.4 (L) 0.7 - 4.0 K/uL   Monocytes Relative 11 %   Monocytes Absolute 0.5 0.1 - 1.0 K/uL   Eosinophils Relative 0 %   Eosinophils Absolute 0.0 0.0 - 0.7 K/uL   Basophils Relative 0 %   Basophils Absolute 0.0 0.0 - 0.1 K/uL  Comprehensive metabolic panel     Status: Abnormal   Collection Time: 11/06/15 11:50 PM  Result Value Ref Range   Sodium 134 (L) 135 - 145 mmol/L   Potassium 4.2 3.5 - 5.1 mmol/L   Chloride 97 (L) 101 - 111 mmol/L   CO2 24 22 - 32 mmol/L   Glucose, Bld 142 (H) 65 - 99 mg/dL   BUN 23 (H) 6 - 20 mg/dL   Creatinine, Ser 5.37 (H) 0.61 - 1.24 mg/dL   Calcium 8.4 (L) 8.9 - 10.3 mg/dL   Total Protein 8.3 (H) 6.5 - 8.1 g/dL   Albumin 3.9 3.5 - 5.0 g/dL   AST 20 15 - 41 U/L   ALT 11 (L) 17 - 63 U/L   Alkaline Phosphatase 195 (H) 38 - 126 U/L   Total Bilirubin 1.2 0.3 - 1.2 mg/dL   GFR calc non Af Amer 9 (L) >60 mL/min   GFR calc Af Amer 11 (L) >60 mL/min    Comment: (NOTE) The eGFR has been calculated using the CKD EPI equation. This calculation has not been validated in all clinical situations. eGFR's persistently <60 mL/min signify possible Chronic Kidney Disease.    Anion gap 13 5 - 15  Magnesium     Status: None   Collection Time: 11/06/15 11:50 PM  Result Value Ref Range   Magnesium 2.2 1.7 - 2.4 mg/dL  Phosphorus     Status: Abnormal   Collection Time: 11/06/15 11:50 PM  Result Value Ref Range   Phosphorus 2.4 (L) 2.5 - 4.6 mg/dL    Lipase, blood     Status: Abnormal   Collection Time: 11/06/15 11:50 PM  Result Value Ref Range   Lipase 105 (  H) 11 - 51 U/L  I-Stat CG4 Lactic Acid, ED     Status: None   Collection Time: 11/06/15 11:54 PM  Result Value Ref Range   Lactic Acid, Venous 1.93 0.5 - 2.0 mmol/L  Comprehensive metabolic panel     Status: Abnormal   Collection Time: 11/07/15  5:23 AM  Result Value Ref Range   Sodium 136 135 - 145 mmol/L   Potassium 4.2 3.5 - 5.1 mmol/L   Chloride 96 (L) 101 - 111 mmol/L   CO2 24 22 - 32 mmol/L   Glucose, Bld 171 (H) 65 - 99 mg/dL   BUN 28 (H) 6 - 20 mg/dL   Creatinine, Ser 5.81 (H) 0.61 - 1.24 mg/dL   Calcium 8.5 (L) 8.9 - 10.3 mg/dL   Total Protein 8.0 6.5 - 8.1 g/dL   Albumin 3.6 3.5 - 5.0 g/dL   AST 20 15 - 41 U/L   ALT 11 (L) 17 - 63 U/L   Alkaline Phosphatase 191 (H) 38 - 126 U/L   Total Bilirubin 1.5 (H) 0.3 - 1.2 mg/dL   GFR calc non Af Amer 8 (L) >60 mL/min   GFR calc Af Amer 10 (L) >60 mL/min    Comment: (NOTE) The eGFR has been calculated using the CKD EPI equation. This calculation has not been validated in all clinical situations. eGFR's persistently <60 mL/min signify possible Chronic Kidney Disease.    Anion gap 16 (H) 5 - 15  CBC     Status: Abnormal   Collection Time: 11/07/15  5:23 AM  Result Value Ref Range   WBC 4.4 4.0 - 10.5 K/uL   RBC 4.24 4.22 - 5.81 MIL/uL   Hemoglobin 11.1 (L) 13.0 - 17.0 g/dL   HCT 35.6 (L) 39.0 - 52.0 %   MCV 84.0 78.0 - 100.0 fL   MCH 26.2 26.0 - 34.0 pg   MCHC 31.2 30.0 - 36.0 g/dL   RDW 15.8 (H) 11.5 - 15.5 %   Platelets 103 (L) 150 - 400 K/uL    Comment: CONSISTENT WITH PREVIOUS RESULT  Glucose, capillary     Status: Abnormal   Collection Time: 11/07/15  7:59 AM  Result Value Ref Range   Glucose-Capillary 141 (H) 65 - 99 mg/dL  MRSA PCR Screening     Status: None   Collection Time: 11/07/15  8:23 AM  Result Value Ref Range   MRSA by PCR NEGATIVE NEGATIVE    Comment:        The GeneXpert MRSA Assay  (FDA approved for NASAL specimens only), is one component of a comprehensive MRSA colonization surveillance program. It is not intended to diagnose MRSA infection nor to guide or monitor treatment for MRSA infections.   Glucose, capillary     Status: Abnormal   Collection Time: 11/07/15 12:19 PM  Result Value Ref Range   Glucose-Capillary 154 (H) 65 - 99 mg/dL   Comment 1 Notify RN    Comment 2 Document in Chart     Ct Abdomen Pelvis Wo Contrast  11/07/2015  CLINICAL DATA:  Abdominal pain, elevated lipase. Right upper quadrant pain. EXAM: CT ABDOMEN AND PELVIS WITHOUT CONTRAST TECHNIQUE: Multidetector CT imaging of the abdomen and pelvis was performed following the standard protocol without IV contrast. COMPARISON:  Ultrasound earlier this day.  CT 05/06/2014 FINDINGS: Lower chest: Extensive coronary artery calcifications. Calcifications at the lung hila, partially included. Probable bilateral calcified hilar lymph nodes, suboptimally assessed. Atelectasis at the lung bases. Liver: No evidence of  focal lesion allowing for lack contrast. Hepatobiliary: Distended gallbladder containing dependent gallstones. Previously assessed by ultrasound. Pancreas: Suboptimally assessed without contrast, however there is diffuse soft tissue fullness. Mild peripancreatic edema. Spleen: Enlarged measuring 15.4 cm craniocaudal. Adrenal glands: Fullness bilaterally.  No discrete nodule. Kidneys: Atrophic parenchyma. Multiple bilateral renal cysts, with a large parapelvic cyst on the left. Some of the cysts appear complicated. Extensive renovascular calcifications. Stomach/Bowel: Stomach distended with ingested contents. Dilated fluid-filled proximal small bowel with transition in the mid right abdomen image 55 series 2. There is mesenteric edema and free fluid. No pneumatosis. More distal small bowel loops are decompressed. Enteric sutures noted at the base of the cecum. There is cecal and ascending colonic wall  thickening. Small volume of stool throughout the remainder the colon. Vascular/Lymphatic: Dense atherosclerosis of the abdominal aorta. No aneurysm. No definite retroperitoneal adenopathy. Reproductive: Prominent sized prostate gland causing mass effect on the bladder base. Bladder: Decompressed. Other: Small volume of free fluid in the abdomen and pelvis. No evidence of free air. Postsurgical change in the anterior abdominal wall with small fat containing periumbilical ventral abdominal wall hernia. Musculoskeletal: There are no acute or suspicious osseous abnormalities. 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 Bony fusion of the sacroiliac joints. Degenerative change throughout spine. IMPRESSION: 1. Small-bowel obstruction with transition point in the right mid abdomen. 2. Soft tissue fullness about the entire pancreas. There is mild peripancreatic edema, however diffuse mesenteric edema is also seen. Underlying pancreatic mass cannot be excluded. If patient is able tolerate breath hold technique, MRI may be of value. 3. Splenomegaly. 4. Cholelithiasis, please reference same-day right upper quadrant ultrasound. 5. Advanced atherosclerosis. 6. Additional chronic findings as described. Electronically Signed   By: Jeb Levering M.D.   On: 11/07/2015 05:55   US Abdomen Limited Ruq  11/07/2015  CLINICAL DATA:  Chronic periumbilical pain for 1 month. Initial encounter. EXAM: US ABDOMEN LIMITED - RIGHT UPPER QUADRANT COMPARISON:  CT of the abdomen and pelvis from 05/03/2014 FINDINGS: Gallbladder: Stones and sludge are noted dependently within the gallbladder. The gallbladder is otherwise unremarkable. No significant gallbladder wall thickening or pericholecystic fluid is seen. No ultrasonographic Murphy's sign is elicited. Common bile duct: Diameter: 0.6 cm, within normal limits in caliber. Liver: No focal lesion identified. Within normal limits in parenchymal echogenicity. IMPRESSION: 1. No  acute abnormality seen at the right upper quadrant. 2. Stones and sludge seen dependently within the gallbladder. Gallbladder otherwise unremarkable in appearance. Electronically Signed   By: Garald Balding M.D.   On: 11/07/2015 01:20    Review of Systems  Constitutional: Negative for weight loss.  HENT: Negative for nosebleeds.   Eyes: Negative for blurred vision.  Respiratory: Negative for shortness of breath.   Cardiovascular: Negative for chest pain, palpitations, orthopnea, leg swelling and PND.       Denies DOE  Gastrointestinal: Positive for nausea and abdominal pain. Negative for vomiting.  Genitourinary: Negative for dysuria and hematuria.  Musculoskeletal: Negative.   Skin: Negative for itching and rash.  Neurological: Negative for dizziness, focal weakness, seizures, loss of consciousness and headaches.       Denies TIAs, amaurosis fugax  Endo/Heme/Allergies: Does not bruise/bleed easily.  Psychiatric/Behavioral: The patient is not nervous/anxious.    Blood pressure 159/76, pulse 91, temperature 98.7 F (37.1 C), temperature source Oral, resp. rate 20, height _0  (1.651 m), weight 84.9 kg (187 lb 2.7 oz), SpO2 100 %. Physical Exam  Vitals reviewed. Constitutional: He is oriented to person, place, and  time. He appears well-developed and well-nourished. No distress.  HENT:  Head: Normocephalic and atraumatic.  Right Ear: External ear normal.  Left Ear: External ear normal.  Eyes: Conjunctivae are normal. No scleral icterus.  Neck: Normal range of motion. Neck supple. No tracheal deviation present. No thyromegaly present.  Cardiovascular: Normal rate and normal heart sounds.   Respiratory: Effort normal and breath sounds normal. No stridor. No respiratory distress. He has no wheezes.  GI: Soft. There is no tenderness. There is no rebound and no guarding.    Musculoskeletal: He exhibits no edema or tenderness.  Left AKA; RLE - numerous old hypopigmented scars; LUE AV  fistula - +thrill  Lymphadenopathy:    He has no cervical adenopathy.  Neurological: He is alert and oriented to person, place, and time. He exhibits normal muscle tone.  Skin: Skin is warm and dry. No rash noted. He is not diaphoretic. No erythema. No pallor.  Psychiatric: He has a normal mood and affect. His behavior is normal. Judgment and thought content normal.    Assessment/Plan: Pancreatitis, probable gallstone Cholelithiasis  Ileus CAD ESRD Heart failure DM2  PAD  Although his CT is more suggestive of SBO, he clinically is acting more like ileus which would go along with the pancreatitis. He has had prior laparotomy so there could be some component of adhesive disease as well.   Will have to follow to see how his pancreatitis resolves/evolves along with ileus Would need cardiology (not today) to evaluate for potential clearance for cholecystectomy If found to be high risk he may benefit from 'palliative' ERCP with sphincterotomy  Agree with bowel rest, repeat plain film in am NG if vomits  Leighton Ruff. Redmond Pulling, MD, FACS General, Bariatric, & Minimally Invasive Surgery [facility], [place]   [facility] M 11/07/2015, 1:27 PM

## 2015-11-08 ENCOUNTER — Inpatient Hospital Stay (HOSPITAL_COMMUNITY): Payer: Medicare Other

## 2015-11-08 DIAGNOSIS — I255 Ischemic cardiomyopathy: Secondary | ICD-10-CM

## 2015-11-08 DIAGNOSIS — I5042 Chronic combined systolic (congestive) and diastolic (congestive) heart failure: Secondary | ICD-10-CM

## 2015-11-08 DIAGNOSIS — Z992 Dependence on renal dialysis: Secondary | ICD-10-CM

## 2015-11-08 DIAGNOSIS — N186 End stage renal disease: Secondary | ICD-10-CM

## 2015-11-08 DIAGNOSIS — Z89612 Acquired absence of left leg above knee: Secondary | ICD-10-CM

## 2015-11-08 DIAGNOSIS — I2510 Atherosclerotic heart disease of native coronary artery without angina pectoris: Secondary | ICD-10-CM

## 2015-11-08 LAB — CBC WITH DIFFERENTIAL/PLATELET
BASOS ABS: 0 10*3/uL (ref 0.0–0.1)
Basophils Relative: 0 %
EOS PCT: 1 %
Eosinophils Absolute: 0 10*3/uL (ref 0.0–0.7)
HEMATOCRIT: 33.6 % — AB (ref 39.0–52.0)
HEMOGLOBIN: 10.6 g/dL — AB (ref 13.0–17.0)
LYMPHS PCT: 14 %
Lymphs Abs: 0.6 10*3/uL — ABNORMAL LOW (ref 0.7–4.0)
MCH: 26.4 pg (ref 26.0–34.0)
MCHC: 31.5 g/dL (ref 30.0–36.0)
MCV: 83.6 fL (ref 78.0–100.0)
MONOS PCT: 16 %
Monocytes Absolute: 0.7 10*3/uL (ref 0.1–1.0)
NEUTROS ABS: 3.3 10*3/uL (ref 1.7–7.7)
Neutrophils Relative %: 69 %
Platelets: 105 10*3/uL — ABNORMAL LOW (ref 150–400)
RBC: 4.02 MIL/uL — ABNORMAL LOW (ref 4.22–5.81)
RDW: 15.8 % — ABNORMAL HIGH (ref 11.5–15.5)
WBC: 4.6 10*3/uL (ref 4.0–10.5)

## 2015-11-08 LAB — LIPASE, BLOOD: LIPASE: 65 U/L — AB (ref 11–51)

## 2015-11-08 LAB — RENAL FUNCTION PANEL
Albumin: 3.2 g/dL — ABNORMAL LOW (ref 3.5–5.0)
Anion gap: 14 (ref 5–15)
BUN: 36 mg/dL — ABNORMAL HIGH (ref 6–20)
CO2: 24 mmol/L (ref 22–32)
Calcium: 7.7 mg/dL — ABNORMAL LOW (ref 8.9–10.3)
Chloride: 98 mmol/L — ABNORMAL LOW (ref 101–111)
Creatinine, Ser: 6.9 mg/dL — ABNORMAL HIGH (ref 0.61–1.24)
GFR calc Af Amer: 8 mL/min — ABNORMAL LOW (ref >60)
GFR calc non Af Amer: 7 mL/min — ABNORMAL LOW (ref >60)
Glucose, Bld: 93 mg/dL (ref 65–99)
Phosphorus: 3.2 mg/dL (ref 2.5–4.6)
Potassium: 4.9 mmol/L (ref 3.5–5.1)
Sodium: 136 mmol/L (ref 135–145)

## 2015-11-08 LAB — COMPREHENSIVE METABOLIC PANEL
ALBUMIN: 3.3 g/dL — AB (ref 3.5–5.0)
ALT: 11 U/L — ABNORMAL LOW (ref 17–63)
ANION GAP: 14 (ref 5–15)
AST: 16 U/L (ref 15–41)
Alkaline Phosphatase: 165 U/L — ABNORMAL HIGH (ref 38–126)
BILIRUBIN TOTAL: 1 mg/dL (ref 0.3–1.2)
BUN: 36 mg/dL — ABNORMAL HIGH (ref 6–20)
CHLORIDE: 99 mmol/L — AB (ref 101–111)
CO2: 24 mmol/L (ref 22–32)
Calcium: 7.8 mg/dL — ABNORMAL LOW (ref 8.9–10.3)
Creatinine, Ser: 6.94 mg/dL — ABNORMAL HIGH (ref 0.61–1.24)
GFR calc Af Amer: 8 mL/min — ABNORMAL LOW (ref >60)
GFR calc non Af Amer: 7 mL/min — ABNORMAL LOW (ref >60)
GLUCOSE: 91 mg/dL (ref 65–99)
POTASSIUM: 4.8 mmol/L (ref 3.5–5.1)
SODIUM: 137 mmol/L (ref 135–145)
TOTAL PROTEIN: 7.3 g/dL (ref 6.5–8.1)

## 2015-11-08 LAB — MAGNESIUM: Magnesium: 2.1 mg/dL (ref 1.7–2.4)

## 2015-11-08 LAB — HEPATITIS B SURFACE ANTIGEN: Hepatitis B Surface Ag: NEGATIVE

## 2015-11-08 MED ORDER — SODIUM CHLORIDE 0.9 % IV SOLN: 100.0000 mL | INTRAVENOUS | Status: DC | PRN

## 2015-11-08 MED ORDER — LIDOCAINE HCL (PF) 1 % IJ SOLN: 5.0000 mL | INTRAMUSCULAR | Status: DC | PRN

## 2015-11-08 MED ORDER — ALTEPLASE 2 MG IJ SOLR: 2.0000 mg | Freq: Once | INTRAMUSCULAR | Status: DC | PRN

## 2015-11-08 MED ORDER — LIDOCAINE-PRILOCAINE 2.5-2.5 % EX CREA: 1.0000 "application " | TOPICAL_CREAM | CUTANEOUS | Status: DC | PRN

## 2015-11-08 MED ORDER — DARBEPOETIN ALFA 25 MCG/0.42ML IJ SOSY: 25.0000 ug | PREFILLED_SYRINGE | INTRAMUSCULAR | Status: DC

## 2015-11-08 MED ORDER — HEPARIN SODIUM (PORCINE) 5000 UNIT/ML IJ SOLN
5000.0000 [IU] | Freq: Three times a day (TID) | INTRAMUSCULAR | Status: DC
  Administered 2015-11-08 – 2015-11-09 (×5): 5000 [IU] via SUBCUTANEOUS
  Filled 2015-11-08 (×8): qty 1

## 2015-11-08 MED ORDER — PENTAFLUOROPROP-TETRAFLUOROETH EX AERO: 1.0000 "application " | INHALATION_SPRAY | CUTANEOUS | Status: DC | PRN

## 2015-11-08 NOTE — Procedures (Signed)
  I was present at this dialysis session, have reviewed the session itself and made  appropriate changes [name] MD [place] pager [phone]    cell [phone] 11/08/2015, 11:04 AM

## 2015-11-08 NOTE — Consult Note (Addendum)
Patient ID: Sean [name] MRN: [id number], DOB/AGE: [birth year]   Admit date: 11/06/2015   Reason for Consult: Pre-operative Risk Assessment/ Surgical Clearance Requesting MD: Dr. Zella Richer   Primary Physician: [name], MD Primary Cardiologist: Dr. Tamala Julian  Pt. Profile:  80 y/o male, followed by Dr. Tamala Julian, with known CAD with high grade RCA disease (failed PCI in 2011), ESRD on HD, chronic combined systolic + diastolic HF EF of 123456, T2DM, PAD s/p left AKA and HDL, admitted for gallstone pancreatitis. Cardiology consulted for pre-operative risk assessment and surgical clearance.   Problem List  Past Medical History  Diagnosis Date  . Hyperparathyroidism   . Arthritis   . Hyperlipidemia   . Hypertension   . Neuromuscular disorder ([place])   . Stroke ([place])   . GERD (gastroesophageal reflux disease)   . Migraine   . Heart attack ([place]) 2011; 1990's  . Peripheral neuropathy ([place])   . Anxiety   . Depression   . Dialysis patient [facility])     M, W, F; [place] CAD (coronary artery disease)     a. s/p NSTEMI and failed RCA PCI in 2011. b. Nuc 12/2013: nonischemic, prior inferior MI.  Marland Kitchen LBBB (left bundle branch block)   . Calciphylaxis     a. Multiple admissions for nonhealing L ulcer/sepsis, s/p L AKA 02/02/2014.  Marland Kitchen Chronic anemia   . Dementia   . ESRD (end stage renal disease) on dialysis ([place])   . Renal insufficiency   . Generalized weakness   . Hypotension     renovascular  . History of left bundle branch block   . Type II diabetes mellitus ([place])     not taking any medications.  . Cholelithiasis   . Hepatic steatosis     Past Surgical History  Procedure Laterality Date  . Parathyroidectomy  2011  . Capd insertion  09/12/2011    Procedure: LAPAROSCOPIC INSERTION CONTINUOUS AMBULATORY PERITONEAL DIALYSIS  (CAPD) CATHETER;  Surgeon: Edward Jolly, MD;  Location: [place];  Service: General;  Laterality: N/A;  . Sp av dialysis shunt access existing  *l*  2003  . Dg av dialysis shunt intro ndl*r* or      "not working" (09/19/11)  . Capd removal  03/12/2012    Procedure: CONTINUOUS AMBULATORY PERITONEAL DIALYSIS  (CAPD) CATHETER REMOVAL;  Surgeon: Edward Jolly, MD;  Location: [place];  Service: General;  Laterality: N/A;  . Amputation Left 02/02/2014    Procedure: AMPUTATION ABOVE KNEE-LEFT;  Surgeon: Elam Dutch, MD;  Location: [place];  Service: Vascular;  Laterality: Left;  . I&d extremity Left 03/31/2014    Procedure: IRRIGATION AND DEBRIDEMENT EXTREMITY - LEFT ABOVE KNEE AMPUTATION;  Surgeon: Elam Dutch, MD;  Location: [place];  Service: Vascular;  Laterality: Left;  . Application of wound vac Left 03/31/2014    Procedure: APPLICATION OF WOUND VAC - LEFT AKA;  Surgeon: Elam Dutch, MD;  Location: [place];  Service: Vascular;  Laterality: Left;  . Colon surgery  2011    colon resection   . Colonoscopy with propofol N/A 07/14/2014    Procedure: COLONOSCOPY WITH PROPOFOL;  Surgeon: Jerene Bears, MD;  Location: [facility];  Service: Gastroenterology;  Laterality: N/A;     Allergies  Allergies  Allergen Reactions  . Benazepril Hcl Hives, Itching and Rash    flareups on patient head    HPI  80 y/o male, followed by Dr. Tamala Julian, with known CAD with high grade  RCA disease (failed PCI in 2011), ESRD on HD, chronic combined systolic + diastolic HF EF of 123456, T2DM, PAD s/p left AKA and HDL, admitted for gallstone pancreatitis. Cardiology consulted for pre-operative risk assessment and surgical clearance.   He was last seen by Dr. Tamala Julian 03/01/2015 and had been asymptomatic with reference to angina. He was felt to be stable and was continued on medical therapy. Dr. Tamala Julian ordered a f/u 2D echo to reassess LVF. EF was 35-40%. Mild AS was noted with mean gradient of 10 mmHg.   He is currently on antibiotics and bowel rest. General surgery is following. Plan is for either laparoscopic vs possible open cholecystectomy.  EKG shows  chronic LBBB. He denies any anginal symptoms. He has a prosthesis and is able to ambulate. No exertional CP or dyspnea.      Home Medications  Prior to Admission medications   Medication Sig Start Date End Date Taking? Authorizing Provider  acetaminophen (TYLENOL) 325 MG tablet Take 650 mg by mouth every 6 (six) hours as needed for mild pain, fever or headache (DO NOT EXCEED 4 GM OF APAP IN 24 HOURS FORM ALL SOURCES).    Yes Historical Provider, MD  Amino Acids-Protein Hydrolys (FEEDING SUPPLEMENT, PRO-STAT SUGAR FREE 64,) LIQD Take 30 mLs by mouth daily.   Yes Historical Provider, MD  aspirin 81 MG chewable tablet Chew 81 mg by mouth daily.    Yes Historical Provider, MD  atorvastatin (LIPITOR) 20 MG tablet Take 20 mg by mouth at bedtime.    Yes Historical Provider, MD  b-complex w/C (TOTAL B/C) TABS tablet Take 1 tablet by mouth at bedtime.   Yes Historical Provider, MD  docusate sodium (COLACE) 100 MG capsule Take 100 mg by mouth daily.   Yes Historical Provider, MD  gabapentin (NEURONTIN) 100 MG capsule Take 100 mg by mouth at bedtime.    Yes Historical Provider, MD  HYDROcodone-acetaminophen (NORCO) 10-325 MG tablet Take one tablet by mouth every 8 hours as needed for moderate pain. Do not exceed 4gm of Tylenol in 24 hours 09/15/15  Yes Gildardo Cranker, DO  midodrine (PROAMATINE) 5 MG tablet Take 5 mg by mouth 3 (three) times daily. Do not take 6 am dose on M- W- F- d/t  dialysis days. 02/01/15  Yes Historical Provider, MD  ondansetron (ZOFRAN) 4 MG tablet Take 4 mg by mouth every 6 (six) hours as needed for nausea.   Yes Historical Provider, MD  sevelamer carbonate (RENVELA) 800 MG tablet Take 800 mg by mouth daily with lunch. 12 pm and 5 pm.   Yes Historical Provider, MD  sorbitol 70 % solution Take 30 mLs by mouth daily as needed (severe constipation).   Yes Historical Provider, MD    Family History  Family History  Problem Relation Age of Onset  . Cancer Sister     breast  . Cancer  Brother     lung  . Anesthesia problems Neg Hx   . Hypotension Neg Hx   . Malignant hyperthermia Neg Hx   . Pseudochol deficiency Neg Hx     Social History  Social History   Social History  . Marital Status: Married    Spouse Name: N/A  . Number of Children: N/A  . Years of Education: N/A   Occupational History  . Not on file.   Social History Main Topics  . Smoking status: Former Smoker -- 0.25 packs/day for 7 years    Types: Cigarettes    Quit date: 06/12/1976  .  Smokeless tobacco: Never Used  . Alcohol Use: No  . Drug Use: No  . Sexual Activity: No   Other Topics Concern  . Not on file   Social History Narrative     Review of Systems General:  No chills, fever, night sweats or weight changes.  Cardiovascular:  No chest pain, dyspnea on exertion, edema, orthopnea, palpitations, paroxysmal nocturnal dyspnea. Dermatological: No rash, lesions/masses Respiratory: No cough, dyspnea Urologic: No hematuria, dysuria Abdominal:   No nausea, vomiting, diarrhea, bright red blood per rectum, melena, or hematemesis Neurologic:  No visual changes, wkns, changes in mental status. All other systems reviewed and are otherwise negative except as noted above.  Physical Exam  Blood pressure 160/90, pulse 93, temperature 98 F (36.7 C), temperature source Oral, resp. rate 18, height 5\' 5"  (1.651 m), weight 157 lb 6.5 oz (71.4 kg), SpO2 99 %.  General: Pleasant, NAD Psych: Normal affect. Neuro: Alert and oriented X 3. Moves all extremities spontaneously. HEENT: Normal  Neck: Supple without bruits or JVD. Lungs:  Resp regular and unlabored, CTA. Heart: RRR, 3/6 systolic murmur Abdomen: Soft, non-tender, non-distended, BS + x 4.  Extremities: No clubbing, cyanosis or edema. S/p left AKA.   Labs  Troponin (Point of Care Test) No results for input(s): TROPIPOC in the last 72 hours. No results for input(s): CKTOTAL, CKMB, TROPONINI in the last 72 hours. Lab Results  Component  Value Date   WBC 4.6 11/08/2015   HGB 10.6* 11/08/2015   HCT 33.6* 11/08/2015   MCV 83.6 11/08/2015   PLT 105* 11/08/2015    Recent Labs Lab 11/08/15 0518 11/08/15 0718  NA 137 136  K 4.8 4.9  CL 99* 98*  CO2 24 24  BUN 36* 36*  CREATININE 6.94* 6.90*  CALCIUM 7.8* 7.7*  PROT 7.3  --   BILITOT 1.0  --   ALKPHOS 165*  --   ALT 11*  --   AST 16  --   GLUCOSE 91 93   Lab Results  Component Value Date   CHOL 136 01/20/2015   HDL 57 01/20/2015   LDLCALC 73 01/20/2015   TRIG 30* 01/20/2015   No results found for: DDIMER   Radiology/Studies  Ct Abdomen Pelvis Wo Contrast  11/07/2015  CLINICAL DATA:  Abdominal pain, elevated lipase. Right upper quadrant pain. EXAM: CT ABDOMEN AND PELVIS WITHOUT CONTRAST TECHNIQUE: Multidetector CT imaging of the abdomen and pelvis was performed following the standard protocol without IV contrast. COMPARISON:  Ultrasound earlier this day.  CT 05/06/2014 FINDINGS: Lower chest: Extensive coronary artery calcifications. Calcifications at the lung hila, partially included. Probable bilateral calcified hilar lymph nodes, suboptimally assessed. Atelectasis at the lung bases. Liver: No evidence of focal lesion allowing for lack contrast. Hepatobiliary: Distended gallbladder containing dependent gallstones. Previously assessed by ultrasound. Pancreas: Suboptimally assessed without contrast, however there is diffuse soft tissue fullness. Mild peripancreatic edema. Spleen: Enlarged measuring 15.4 cm craniocaudal. Adrenal glands: Fullness bilaterally.  No discrete nodule. Kidneys: Atrophic parenchyma. Multiple bilateral renal cysts, with a large parapelvic cyst on the left. Some of the cysts appear complicated. Extensive renovascular calcifications. Stomach/Bowel: Stomach distended with ingested contents. Dilated fluid-filled proximal small bowel with transition in the mid right abdomen image 55 series 2. There is mesenteric edema and free fluid. No pneumatosis.  More distal small bowel loops are decompressed. Enteric sutures noted at the base of the cecum. There is cecal and ascending colonic wall thickening. Small volume of stool throughout the remainder the colon. Vascular/Lymphatic: Dense  atherosclerosis of the abdominal aorta. No aneurysm. No definite retroperitoneal adenopathy. Reproductive: Prominent sized prostate gland causing mass effect on the bladder base. Bladder: Decompressed. Other: Small volume of free fluid in the abdomen and pelvis. No evidence of free air. Postsurgical change in the anterior abdominal wall with small fat containing periumbilical ventral abdominal wall hernia. Musculoskeletal: There are no acute or suspicious osseous abnormalities. 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 Bony fusion of the sacroiliac joints. Degenerative change throughout spine. IMPRESSION: 1. Small-bowel obstruction with transition point in the right mid abdomen. 2. Soft tissue fullness about the entire pancreas. There is mild peripancreatic edema, however diffuse mesenteric edema is also seen. Underlying pancreatic mass cannot be excluded. If patient is able tolerate breath hold technique, MRI may be of value. 3. Splenomegaly. 4. Cholelithiasis, please reference same-day right upper quadrant ultrasound. 5. Advanced atherosclerosis. 6. Additional chronic findings as described. Electronically Signed   By: Jeb Levering M.D.   On: 11/07/2015 05:55   US Abdomen Limited Ruq  11/07/2015  CLINICAL DATA:  Chronic periumbilical pain for 1 month. Initial encounter. EXAM: US ABDOMEN LIMITED - RIGHT UPPER QUADRANT COMPARISON:  CT of the abdomen and pelvis from 05/03/2014 FINDINGS: Gallbladder: Stones and sludge are noted dependently within the gallbladder. The gallbladder is otherwise unremarkable. No significant gallbladder wall thickening or pericholecystic fluid is seen. No ultrasonographic Murphy's sign is elicited. Common bile duct: Diameter: 0.6 cm,  within normal limits in caliber. Liver: No focal lesion identified. Within normal limits in parenchymal echogenicity. IMPRESSION: 1. No acute abnormality seen at the right upper quadrant. 2. Stones and sludge seen dependently within the gallbladder. Gallbladder otherwise unremarkable in appearance. Electronically Signed   By: Garald Balding M.D.   On: 11/07/2015 01:20    ECG  Chronic LBBB.   Echocardiogram on 03/09/2015  - Left ventricle: EF hard to assess due to pool visualization of  endocardial segments even with definity contrast as well as  paradoxical septal motion from interventricular conduction delay.  LVF appears moderately reduced. The cavity size was normal.  Images were inadequate for LV wall motion assessment. There was  an increased relative contribution of atrial contraction to  ventricular filling. Doppler parameters are consistent with  abnormal left ventricular relaxation (grade 1 diastolic  dysfunction). Doppler parameters are consistent with high  ventricular filling pressure. - Ventricular septum: Septal motion showed paradox. These changes  are consistent with intraventricular conduction delay. - Aortic valve: Moderately calcified annulus. Trileaflet. Moderate  thickening and calcification, consistent with sclerosis. There  was mild stenosis but the actual degree of aortic stenosis may be  underestimated due to underlying LV dysfunction. Valve area  (VTI): 1.52 cm^2. Valve area (Vmean): 1.57 cm^2. - Mitral valve: Severely calcified annulus. Moderate diffuse  thickening and calcification of the anterior leaflet. Valve area  by continuity equation (using LVOT flow): 2.6 cm^2. - Left atrium: The atrium was moderately dilated.    ASSESSMENT AND PLAN  Active Problems:   End stage renal disease on dialysis ([facility])   S/P AKA (above knee amputation) unilateral ([facility])   Type 2 diabetes mellitus with chronic kidney disease on chronic dialysis, without  long-term current use of insulin ([facility])   Gallstone pancreatitis   Abdominal pain, epigastric  Dr. Meda Coffee to assess. Recommendations to follow.   Signed, Lyda Jester, PA-C 11/08/2015, 11:30 AM  The patient was seen, examined and discussed with Brittainy M. Rosita Fire, PA-C and I agree with the above.   80 y/o male, with known CAD  with high grade RCA disease (failed PCI in 2011), ESRD on HD, chronic combined systolic + diastolic HF EF of 123456 on echocardiogram in September 2016, T2DM, PAD s/p left AKA and HDL, admitted for gallstone pancreatitis. Cardiology consulted for pre-operative risk assessment and surgical clearance.  The patient has been followed by Dr. Tamala Julian last seen in September 2016 review of considerable being stable from CHF and coronary artery disease.  Today he states that he has been doing well, he is able to do activities of daily living without any angina and only shortness of breath moderate exertion, but is unchanged from prior. He denies any palpitations or syncope. Physical exam he doesn't show any signs of fluid overload, he has loud holosystolic murmur, that is most probably combination of mild aortic stenosis described on echo in September 2016 an exaggerated by hyperdynamic flow secondary to AV fistula. He has S2 present and certainly doesn't have severe aortic stenosis.  1. Preoperative evaluation for intermediate risk abdominal surgery - the patient is considered moderate risk secondary to known decreased LVEF and coronary artery disease, however he is currently asymptomatic and has no signs of angina or congestive heart failure. We will monitor him in a perioperative period but there is currently no contraindication from scarring standpoint for this patient to undergo cholecystectomy. His EKG shows sinus rhythm and left bundle branch block and is unchanged from prior EKG in September 2016.  2. Chronic combined systolic and diastolic congestive heart failure No  evidence of volume overload. Current LV EF 35-40%. He is actually 13 pounds lighter than on the last outpatient visit in September.  3. Coronary artery disease involving native coronary artery of native heart without angina pectoris Asymptomatic, continue aspirin and atorvastatin, unable to use beta blocker secondary to hypotension.  4. Atherosclerotic peripheral vascular disease with ulceration Left AKA. No right lower extremity complaints.  5. End-stage renal disease on hemodialysis Had dialysis earlier today and is stable.   Ena Dawley, MD 11/08/2015

## 2015-11-08 NOTE — Progress Notes (Signed)
Subjective: Still with some intermittent supraumbilical pain.  Objective: Vital signs in last 24 hours: Temp:  [97.5 F (36.4 C)-99.3 F (37.4 C)] 98.2 F (36.8 C) (05/29 0655) Pulse Rate:  [77-93] 93 (05/29 1049) Resp:  [18-20] 18 (05/29 0655) BP: (122-154)/(68-85) 122/70 mmHg (05/29 1049) SpO2:  [98 %-100 %] 98 % (05/29 0655) Weight:  [72.1 kg (158 lb 15.2 oz)-72.621 kg (160 lb 1.6 oz)] 72.1 kg (158 lb 15.2 oz) (05/29 0655) Last BM Date: 11/08/15  Intake/Output from previous day: 05/28 0701 - 05/29 0700 In: 1720 [I.V.:1620; IV Piggyback:100] Out: 0  Intake/Output this shift:    PE: General- In NAD Abdomen-soft, no significant tenderness.  Lab Results:   Recent Labs  11/07/15 0523 11/08/15 0518  WBC 4.4 4.6  HGB 11.1* 10.6*  HCT 35.6* 33.6*  PLT 103* 105*   BMET  Recent Labs  11/08/15 0518 11/08/15 0718  NA 137 136  K 4.8 4.9  CL 99* 98*  CO2 24 24  GLUCOSE 91 93  BUN 36* 36*  CREATININE 6.94* 6.90*  CALCIUM 7.8* 7.7*   PT/INR No results for input(s): LABPROT, INR in the last 72 hours. Comprehensive Metabolic Panel:    Component Value Date/Time   NA 136 11/08/2015 0718   NA 137 11/08/2015 0518   NA 137 09/01/2015   NA 135* 01/20/2015 1031   K 4.9 11/08/2015 0718   K 4.8 11/08/2015 0518   CL 98* 11/08/2015 0718   CL 99* 11/08/2015 0518   CO2 24 11/08/2015 0718   CO2 24 11/08/2015 0518   BUN 36* 11/08/2015 0718   BUN 36* 11/08/2015 0518   BUN 55* 08/18/2015   BUN 84* 01/20/2015 1031   CREATININE 6.90* 11/08/2015 0718   CREATININE 6.94* 11/08/2015 0518   CREATININE 6.5* 09/01/2015   CREATININE 6.6* 01/20/2015 1031   GLUCOSE 93 11/08/2015 0718   GLUCOSE 91 11/08/2015 0518   CALCIUM 7.7* 11/08/2015 0718   CALCIUM 7.8* 11/08/2015 0518   AST 16 11/08/2015 0518   AST 20 11/07/2015 0523   ALT 11* 11/08/2015 0518   ALT 11* 11/07/2015 0523   ALKPHOS 165* 11/08/2015 0518   ALKPHOS 191* 11/07/2015 0523   BILITOT 1.0 11/08/2015 0518    BILITOT 1.5* 11/07/2015 0523   PROT 7.3 11/08/2015 0518   PROT 8.0 11/07/2015 0523   ALBUMIN 3.2* 11/08/2015 0718   ALBUMIN 3.3* 11/08/2015 0518     Studies/Results: Ct Abdomen Pelvis Wo Contrast  11/07/2015  CLINICAL DATA:  Abdominal pain, elevated lipase. Right upper quadrant pain. EXAM: CT ABDOMEN AND PELVIS WITHOUT CONTRAST TECHNIQUE: Multidetector CT imaging of the abdomen and pelvis was performed following the standard protocol without IV contrast. COMPARISON:  Ultrasound earlier this day.  CT 05/06/2014 FINDINGS: Lower chest: Extensive coronary artery calcifications. Calcifications at the lung hila, partially included. Probable bilateral calcified hilar lymph nodes, suboptimally assessed. Atelectasis at the lung bases. Liver: No evidence of focal lesion allowing for lack contrast. Hepatobiliary: Distended gallbladder containing dependent gallstones. Previously assessed by ultrasound. Pancreas: Suboptimally assessed without contrast, however there is diffuse soft tissue fullness. Mild peripancreatic edema. Spleen: Enlarged measuring 15.4 cm craniocaudal. Adrenal glands: Fullness bilaterally.  No discrete nodule. Kidneys: Atrophic parenchyma. Multiple bilateral renal cysts, with a large parapelvic cyst on the left. Some of the cysts appear complicated. Extensive renovascular calcifications. Stomach/Bowel: Stomach distended with ingested contents. Dilated fluid-filled proximal small bowel with transition in the mid right abdomen image 55 series 2. There is mesenteric edema and free fluid. No pneumatosis.  More distal small bowel loops are decompressed. Enteric sutures noted at the base of the cecum. There is cecal and ascending colonic wall thickening. Small volume of stool throughout the remainder the colon. Vascular/Lymphatic: Dense atherosclerosis of the abdominal aorta. No aneurysm. No definite retroperitoneal adenopathy. Reproductive: Prominent sized prostate gland causing mass effect on the  bladder base. Bladder: Decompressed. Other: Small volume of free fluid in the abdomen and pelvis. No evidence of free air. Postsurgical change in the anterior abdominal wall with small fat containing periumbilical ventral abdominal wall hernia. Musculoskeletal: There are no acute or suspicious osseous abnormalities. 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 Bony fusion of the sacroiliac joints. Degenerative change throughout spine. IMPRESSION: 1. Small-bowel obstruction with transition point in the right mid abdomen. 2. Soft tissue fullness about the entire pancreas. There is mild peripancreatic edema, however diffuse mesenteric edema is also seen. Underlying pancreatic mass cannot be excluded. If patient is able tolerate breath hold technique, MRI may be of value. 3. Splenomegaly. 4. Cholelithiasis, please reference same-day right upper quadrant ultrasound. 5. Advanced atherosclerosis. 6. Additional chronic findings as described. Electronically Signed   By: Jeb Levering M.D.   On: 11/07/2015 05:55   US Abdomen Limited Ruq  11/07/2015  CLINICAL DATA:  Chronic periumbilical pain for 1 month. Initial encounter. EXAM: US ABDOMEN LIMITED - RIGHT UPPER QUADRANT COMPARISON:  CT of the abdomen and pelvis from 05/03/2014 FINDINGS: Gallbladder: Stones and sludge are noted dependently within the gallbladder. The gallbladder is otherwise unremarkable. No significant gallbladder wall thickening or pericholecystic fluid is seen. No ultrasonographic Murphy's sign is elicited. Common bile duct: Diameter: 0.6 cm, within normal limits in caliber. Liver: No focal lesion identified. Within normal limits in parenchymal echogenicity. IMPRESSION: 1. No acute abnormality seen at the right upper quadrant. 2. Stones and sludge seen dependently within the gallbladder. Gallbladder otherwise unremarkable in appearance. Electronically Signed   By: Garald Balding M.D.   On: 11/07/2015 01:20     Anti-infectives: Anti-infectives    Start     Dose/Rate Route Frequency Ordered Stop   11/07/15 2200  cefTRIAXone (ROCEPHIN) 2 g in dextrose 5 % 50 mL IVPB  Status:  Discontinued     2 g 100 mL/hr over 30 Minutes Intravenous Every 24 hours 11/07/15 0403 11/07/15 0816   11/07/15 0900  piperacillin-tazobactam (ZOSYN) IVPB 2.25 g     2.25 g 100 mL/hr over 30 Minutes Intravenous Every 8 hours 11/07/15 0830     11/07/15 0200  cefTRIAXone (ROCEPHIN) 2 g in dextrose 5 % 50 mL IVPB     2 g 100 mL/hr over 30 Minutes Intravenous  Once 11/07/15 0145 11/07/15 0318      Assessment Active Problems:   Gallstone pancreatitis-still with some pain.   ? SBO-bowels moving.   End stage renal disease on dialysis ([facility])   S/P AKA (above knee amputation) unilateral ([facility]CC)   Type 2 diabetes mellitus with chronic kidney disease on chronic dialysis, without long-term current use of insulin ([place])     LOS: 1 day   Rec:  Continued bowel rest.  Preop Cardiology consult for risk assessment for laparoscopic possible open cholecystectomy.   Wallie Lagrand J 11/08/2015

## 2015-11-08 NOTE — Progress Notes (Signed)
[facility] Progress Note  Subjective:  Feeling better today and looks better as well.  Abdominal pain improved.  No vomiting.  Had a BM this AM.  Getting dialysis currently.  Objective:  Vital signs in last 24 hours: Temp:  [97.5 F (36.4 C)-99.3 F (37.4 C)] 98.2 F (36.8 C) (05/29 0655) Pulse Rate:  [77-91] 88 (05/29 0900) Resp:  [18-20] 18 (05/29 0655) BP: (124-159)/(68-85) 154/81 mmHg (05/29 0900) SpO2:  [98 %-100 %] 98 % (05/29 0655) Weight:  [158 lb 15.2 oz (72.1 kg)-160 lb 1.6 oz (72.621 kg)] 158 lb 15.2 oz (72.1 kg) (05/29 0655) Last BM Date: 11/08/15 General:  Alert, Well-developed, in NAD Heart:  Regular rate and rhythm Pulm:  CTAB. Abdomen:  Soft, minimally distended.  BS present.  Minimal TTP. Extremities:  Without edema.  Left AKA. Neurologic:  Alert and oriented x 4;  grossly normal neurologically. Psych:  Alert and cooperative. Normal mood and affect.  Intake/Output from previous day: 05/28 0701 - 05/29 0700 In: 1720 [I.V.:1620; IV Piggyback:100] Out: 0   Lab Results:  Recent Labs  11/06/15 2350 11/07/15 0523 11/08/15 0518  WBC 4.4 4.4 4.6  HGB 11.3* 11.1* 10.6*  HCT 35.4* 35.6* 33.6*  PLT 93* 103* 105*   BMET  Recent Labs  11/07/15 0523 11/08/15 0518 11/08/15 0718  NA 136 137 136  K 4.2 4.8 4.9  CL 96* 99* 98*  CO2 24 24 24   GLUCOSE 171* 91 93  BUN 28* 36* 36*  CREATININE 5.81* 6.94* 6.90*  CALCIUM 8.5* 7.8* 7.7*   LFT  Recent Labs  11/08/15 0518 11/08/15 0718  PROT 7.3  --   ALBUMIN 3.3* 3.2*  AST 16  --   ALT 11*  --   ALKPHOS 165*  --   BILITOT 1.0  --    Ct Abdomen Pelvis Wo Contrast  11/07/2015  CLINICAL DATA:  Abdominal pain, elevated lipase. Right upper quadrant pain. EXAM: CT ABDOMEN AND PELVIS WITHOUT CONTRAST TECHNIQUE: Multidetector CT imaging of the abdomen and pelvis was performed following the standard protocol without IV contrast. COMPARISON:  Ultrasound earlier this day.  CT 05/06/2014  FINDINGS: Lower chest: Extensive coronary artery calcifications. Calcifications at the lung hila, partially included. Probable bilateral calcified hilar lymph nodes, suboptimally assessed. Atelectasis at the lung bases. Liver: No evidence of focal lesion allowing for lack contrast. Hepatobiliary: Distended gallbladder containing dependent gallstones. Previously assessed by ultrasound. Pancreas: Suboptimally assessed without contrast, however there is diffuse soft tissue fullness. Mild peripancreatic edema. Spleen: Enlarged measuring 15.4 cm craniocaudal. Adrenal glands: Fullness bilaterally.  No discrete nodule. Kidneys: Atrophic parenchyma. Multiple bilateral renal cysts, with a large parapelvic cyst on the left. Some of the cysts appear complicated. Extensive renovascular calcifications. Stomach/Bowel: Stomach distended with ingested contents. Dilated fluid-filled proximal small bowel with transition in the mid right abdomen image 55 series 2. There is mesenteric edema and free fluid. No pneumatosis. More distal small bowel loops are decompressed. Enteric sutures noted at the base of the cecum. There is cecal and ascending colonic wall thickening. Small volume of stool throughout the remainder the colon. Vascular/Lymphatic: Dense atherosclerosis of the abdominal aorta. No aneurysm. No definite retroperitoneal adenopathy. Reproductive: Prominent sized prostate gland causing mass effect on the bladder base. Bladder: Decompressed. Other: Small volume of free fluid in the abdomen and pelvis. No evidence of free air. Postsurgical change in the anterior abdominal wall with small fat containing periumbilical ventral abdominal wall hernia. Musculoskeletal: There are no acute or suspicious  osseous abnormalities. 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 Bony fusion of the sacroiliac joints. Degenerative change throughout spine. IMPRESSION: 1. Small-bowel obstruction with transition point in the right mid  abdomen. 2. Soft tissue fullness about the entire pancreas. There is mild peripancreatic edema, however diffuse mesenteric edema is also seen. Underlying pancreatic mass cannot be excluded. If patient is able tolerate breath hold technique, MRI may be of value. 3. Splenomegaly. 4. Cholelithiasis, please reference same-day right upper quadrant ultrasound. 5. Advanced atherosclerosis. 6. Additional chronic findings as described. Electronically Signed   By: Jeb Levering M.D.   On: 11/07/2015 05:55   US Abdomen Limited Ruq  11/07/2015  CLINICAL DATA:  Chronic periumbilical pain for 1 month. Initial encounter. EXAM: US ABDOMEN LIMITED - RIGHT UPPER QUADRANT COMPARISON:  CT of the abdomen and pelvis from 05/03/2014 FINDINGS: Gallbladder: Stones and sludge are noted dependently within the gallbladder. The gallbladder is otherwise unremarkable. No significant gallbladder wall thickening or pericholecystic fluid is seen. No ultrasonographic Murphy's sign is elicited. Common bile duct: Diameter: 0.6 cm, within normal limits in caliber. Liver: No focal lesion identified. Within normal limits in parenchymal echogenicity. IMPRESSION: 1. No acute abnormality seen at the right upper quadrant. 2. Stones and sludge seen dependently within the gallbladder. Gallbladder otherwise unremarkable in appearance. Electronically Signed   By: Garald Balding M.D.   On: 11/07/2015 01:20   Assessment / Plan: -PSBO vs ileus: Surgery following as well. NGT placement was attempted by nursing staff but unsuccessful.  He has not been vomiting so it has been held for now. -Gallstone pancreatitis: Has gallstones and sludge on ultrasound. No biliary dilation. Had acute cholecystitis in 2015 at which time he was treated with perc chole because he was a poor surgical candidate. Lipase only minimally elevated, but there are some pancreatic findings on CT scan. -History of ischemic colitis requiring surgery/partial colectomy -ESRD on HD   -Multiple medical problems  *Continue conservative/supportive measures for now with anti-emetics, pain control, etc. *Await results of abdominal x-ray.  ? Trial of clears pending results.   LOS: 1 day   ZEHR, JESSICA D.  11/08/2015, 9:01 AM  Pager number [phone]   ________________________________________________________________________  Velora Heckler [facility] MD note:  I personally examined the patient, reviewed the data and agree with the assessment and plan described above.   Owens Loffler, MD [facility] Pager [phone]

## 2015-11-08 NOTE — Progress Notes (Signed)
[facility] Progress Note  Patient: Sean [name] [id number]   PCP: [name], MD DOB: 08/11/1935   DOA: 11/06/2015   DOS: 11/08/2015   Date of Service: the patient was seen and examined on 11/08/2015  Subjective: Patient mentions nausea is resolved. No vomiting episode yesterday. Continues to possibly has no BM. Nutrition: He remains nothing by mouth  Brief hospital course: Patient was admitted on 11/06/2015, with complaint of abdominal pain, nausea as well as vomiting, was found to have acute pancreatitis secondary to gallstone. Currently further plan is continue monitoring.  Assessment and Plan: 1. Gallstone pancreatitis Appreciate input from gastroenterology as well as general surgery. Continue conservative management. Likely ileus as well from gallstone pancreatitis. X-ray abdomen shows resolving small bowel obstruction versus ileus. We will await recommendation from the consultants. Patient will need cholecystectomy prior to discharge. Cardiology has informed that the patient will be moderate risk for cardiovascular outcomes for surgery. I would discontinue antibiotics since there is no evidence of ongoing infection at present  2. ESRD. Continue hemodialysis Monday Wednesday Friday as scheduled. No evidence of volume overload.   3  S/P AKA (above knee amputation) unilateral (HCC) Continue to monitor. Will need physical therapy evaluation prior to discharge.  4.  Type 2 diabetes mellitus with chronic kidney disease on chronic dialysis, without long-term current use of insulin (HCC) Continue sliding scale insulin at present.  Pain management: When necessary Dilaudid Activity: Consulted physical therapy Bowel regimen: last BM 11/08/2015 Diet: npo DVT Prophylaxis: subcutaneous Heparin  Advance goals of care discussion: Full code  Family Communication: no family was present at bedside, at the time of interview.   Disposition:  Discharge to likely home,  with home health Expected discharge date: 11/12/2015  Consultants: Gastroenterology, general surgery, cardiology Procedures: None  Antibiotics: Anti-infectives    Start     Dose/Rate Route Frequency Ordered Stop   11/07/15 2200  cefTRIAXone (ROCEPHIN) 2 g in dextrose 5 % 50 mL IVPB  Status:  Discontinued     2 g 100 mL/hr over 30 Minutes Intravenous Every 24 hours 11/07/15 0403 11/07/15 0816   11/07/15 0900  piperacillin-tazobactam (ZOSYN) IVPB 2.25 g     2.25 g 100 mL/hr over 30 Minutes Intravenous Every 8 hours 11/07/15 0830     11/07/15 0200  cefTRIAXone (ROCEPHIN) 2 g in dextrose 5 % 50 mL IVPB     2 g 100 mL/hr over 30 Minutes Intravenous  Once 11/07/15 0145 11/07/15 0318        Intake/Output Summary (Last 24 hours) at 11/08/15 1833 Last data filed at 11/08/15 1420  Gross per 24 hour  Intake   1300 ml  Output      0 ml  Net   1300 ml   Filed Weights   11/07/15 2003 11/08/15 0655 11/08/15 1055  Weight: 72.621 kg (160 lb 1.6 oz) 72.1 kg (158 lb 15.2 oz) 71.4 kg (157 lb 6.5 oz)    Objective: Physical Exam: Filed Vitals:   11/08/15 1049 11/08/15 1055 11/08/15 1148 11/08/15 1651  BP: 122/70 160/90 136/52 143/60  Pulse: 93 93 88 92  Temp:  98 F (36.7 C) 98.2 F (36.8 C) 99 F (37.2 C)  TempSrc:  Oral Oral Oral  Resp:  18 17 16   Height:      Weight:  71.4 kg (157 lb 6.5 oz)    SpO2:  99% 99% 97%    General: Alert, Awake and Oriented to Time, Place and Person. Appear in mild distress Eyes:  PERRL, Conjunctiva normal ENT: Oral Mucosa clear moist. Neck: no JVD, no Abnormal Mass Or lumps Cardiovascular: S1 and S2 Present, aortic systolic  Murmur, Peripheral Pulses Present Respiratory: Bilateral Air entry equal and Decreased, Clear to Auscultation, no Crackles, no wheezes Abdomen: Bowel Sound present, Soft and mild diffuse tenderness Skin: no redness, no Rash  Extremities: trace Pedal edema, no calf tenderness Neurologic: Grossly no focal neuro deficit.  Bilaterally Equal motor strength  Data Reviewed: CBC:  Recent Labs Lab 11/06/15 2350 11/07/15 0523 11/08/15 0518  WBC 4.4 4.4 4.6  NEUTROABS 3.6  --  3.3  HGB 11.3* 11.1* 10.6*  HCT 35.4* 35.6* 33.6*  MCV 82.7 84.0 83.6  PLT 93* 103* 123456*   Basic Metabolic Panel:  Recent Labs Lab 11/06/15 2350 11/07/15 0523 11/08/15 0518 11/08/15 0718  NA 134* 136 137 136  K 4.2 4.2 4.8 4.9  CL 97* 96* 99* 98*  CO2 24 24 24 24   GLUCOSE 142* 171* 91 93  BUN 23* 28* 36* 36*  CREATININE 5.37* 5.81* 6.94* 6.90*  CALCIUM 8.4* 8.5* 7.8* 7.7*  MG 2.2  --  2.1  --   PHOS 2.4*  --   --  3.2    Liver Function Tests:  Recent Labs Lab 11/06/15 2350 11/07/15 0523 11/08/15 0518 11/08/15 0718  AST 20 20 16   --   ALT 11* 11* 11*  --   ALKPHOS 195* 191* 165*  --   BILITOT 1.2 1.5* 1.0  --   PROT 8.3* 8.0 7.3  --   ALBUMIN 3.9 3.6 3.3* 3.2*    Recent Labs Lab 11/06/15 2350 11/08/15 1137  LIPASE 105* 65*   No results for input(s): AMMONIA in the last 168 hours. Coagulation Profile: No results for input(s): INR, PROTIME in the last 168 hours. Cardiac Enzymes: No results for input(s): CKTOTAL, CKMB, CKMBINDEX, TROPONINI in the last 168 hours. BNP (last 3 results) No results for input(s): PROBNP in the last 8760 hours.  CBG:  Recent Labs Lab 11/07/15 0759 11/07/15 1219 11/07/15 1648  GLUCAP 141* 154* 126*    Studies: Dg Abd 2 Views  11/08/2015  CLINICAL DATA:  Small-bowel obstruction EXAM: ABDOMEN - 2 VIEW COMPARISON:  CT abdomen 11/07/2015 FINDINGS: Mildly dilated small bowel loops with air-fluid levels. Gas in the transverse colon with air-fluid levels. Upright view does not include the right hemidiaphragm. This would need to be repeated if there is concern of free air. IMPRESSION: Air-fluid levels in large and small bowel may be related to ileus or resolving small bowel obstruction Right hemidiaphragm not included on the upright view. If there is concern of free air, repeat  upright view could be performed. Electronically Signed   By: Franchot Gallo M.D.   On: 11/08/2015 15:01     Scheduled Meds: . [START ON 11/10/2015] darbepoetin (ARANESP) injection - DIALYSIS  25 mcg Intravenous Q Wed-HD  . heparin subcutaneous  5,000 Units Subcutaneous Q8H  . pantoprazole (PROTONIX) IV  40 mg Intravenous Q24H  . piperacillin-tazobactam (ZOSYN)  IV  2.25 g Intravenous Q8H   Continuous Infusions: . sodium chloride 10 mL/hr at 11/08/15 0756   PRN Meds: acetaminophen, albuterol, HYDROmorphone (DILAUDID) injection, ondansetron **OR** ondansetron (ZOFRAN) IV  Time spent: 30 minutes  Author: Berle Mull, MD [facility] Pager: [phone] 11/08/2015 6:33 PM  If 7PM-7AM, please contact night-coverage at [email or web address], password [facility]

## 2015-11-08 NOTE — Consult Note (Signed)
[facility] Renal Consultation Note    Indication for Consultation:  Management of ESRD/hemodialysis; anemia, hypertension/volume and secondary hyperparathyroidism PCP: [name] - Dr. Bubba Camp and Marlowe Sax, NP  HPI: Sean [name] is a very pleasant  80 y.o. AA male gentleman with ESRD secondary to DM on HD since February 2003 who resides at [place] on a MWF schedule at [facility].  PMHx is also significant for ASCVD, hx MI, CVA. Ischemic colitis with partial colectomy,  left AKA and lower extremiity calciphylaxis (S/p parathyroidectomy 2011 with most recent iPTH 1794 off VDRA and intolerant of sensipar and EF 35- 40% 02/2015. He was admitted November 2015 with partial SBO and ischemic colitis. He states he was in his usual state of health until Saturday am when he started to have abdominal pain while eating breakfast. It reminded him of the time he needed to have part of his intestine removed. He is prone to constipation and tells me his phosphate binders are contributory (renvela). The abdominal pain has been central and made worse by pressing on it during PE.  He did however have a BM earlier this am and feels much better since then. He denies SOB, CP. He doesn't make urine. He admits to losing a little weight. He is fully compliant with dialysis treatments.  Evaluation in the ED showed CT with SBO, pancreatic edema and gallstones/sludge. Lipase 105, WBC 4.4 with normal diff. Temp initially was 98.2 - with slight bump to 99.3. LFTs are normal initial T bili was 1.2 up to 1.5 and 1 today  Past Medical History  Diagnosis Date  . Hyperparathyroidism   . Arthritis   . Hyperlipidemia   . Hypertension   . Neuromuscular disorder ([place])   . Stroke ([place])   . GERD (gastroesophageal reflux disease)   . Migraine   . Heart attack ([place]) 2011; 1990's  . Peripheral neuropathy ([place])   . Anxiety   . Depression   . Dialysis patient [facility])     M, W, F; [place]  CAD (coronary artery disease)     a. s/p NSTEMI and failed RCA PCI in 2011. b. Nuc 12/2013: nonischemic, prior inferior MI.  Marland Kitchen LBBB (left bundle branch block)   . Calciphylaxis     a. Multiple admissions for nonhealing L ulcer/sepsis, s/p L AKA 02/02/2014.  Marland Kitchen Chronic anemia   . Dementia   . ESRD (end stage renal disease) on dialysis ([place])   . Renal insufficiency   . Generalized weakness   . Hypotension     renovascular  . History of left bundle branch block   . Type II diabetes mellitus ([place])     not taking any medications.  . Cholelithiasis   . Hepatic steatosis    Past Surgical History  Procedure Laterality Date  . Parathyroidectomy  2011  . Capd insertion  09/12/2011    Procedure: LAPAROSCOPIC INSERTION CONTINUOUS AMBULATORY PERITONEAL DIALYSIS  (CAPD) CATHETER;  Surgeon: Edward Jolly, MD;  Location: [place];  Service: General;  Laterality: N/A;  . Sp av dialysis shunt access existing *l*  2003  . Dg av dialysis shunt intro ndl*r* or      "not working" (09/19/11)  . Capd removal  03/12/2012    Procedure: CONTINUOUS AMBULATORY PERITONEAL DIALYSIS  (CAPD) CATHETER REMOVAL;  Surgeon: Edward Jolly, MD;  Location: [place];  Service: General;  Laterality: N/A;  . Amputation Left 02/02/2014    Procedure: AMPUTATION ABOVE KNEE-LEFT;  Surgeon:  Elam Dutch, MD;  Location: [place];  Service: Vascular;  Laterality: Left;  . I&d extremity Left 03/31/2014    Procedure: IRRIGATION AND DEBRIDEMENT EXTREMITY - LEFT ABOVE KNEE AMPUTATION;  Surgeon: Elam Dutch, MD;  Location: [place];  Service: Vascular;  Laterality: Left;  . Application of wound vac Left 03/31/2014    Procedure: APPLICATION OF WOUND VAC - LEFT AKA;  Surgeon: Elam Dutch, MD;  Location: [place];  Service: Vascular;  Laterality: Left;  . Colon surgery  2011    colon resection   . Colonoscopy with propofol N/A 07/14/2014    Procedure: COLONOSCOPY WITH PROPOFOL;  Surgeon: Jerene Bears, MD;  Location: [facility];   Service: Gastroenterology;  Laterality: N/A;   Family History  Problem Relation Age of Onset  . Cancer Sister     breast  . Cancer Brother     lung  . Anesthesia problems Neg Hx   . Hypotension Neg Hx   . Malignant hyperthermia Neg Hx   . Pseudochol deficiency Neg Hx    Social History:  reports that he quit smoking about 39 years ago. His smoking use included Cigarettes. He has a 1.75 pack-year smoking history. He has never used smokeless tobacco. He reports that he does not drink alcohol or use illicit drugs. Allergies  Allergen Reactions  . Benazepril Hcl Hives, Itching and Rash    flareups on patient head   Prior to Admission medications   Medication Sig Start Date End Date Taking? Authorizing Provider  acetaminophen (TYLENOL) 325 MG tablet Take 650 mg by mouth every 6 (six) hours as needed for mild pain, fever or headache (DO NOT EXCEED 4 GM OF APAP IN 24 HOURS FORM ALL SOURCES).    Yes Historical Provider, MD  Amino Acids-Protein Hydrolys (FEEDING SUPPLEMENT, PRO-STAT SUGAR FREE 64,) LIQD Take 30 mLs by mouth daily.   Yes Historical Provider, MD  aspirin 81 MG chewable tablet Chew 81 mg by mouth daily.    Yes Historical Provider, MD  atorvastatin (LIPITOR) 20 MG tablet Take 20 mg by mouth at bedtime.    Yes Historical Provider, MD  b-complex w/C (TOTAL B/C) TABS tablet Take 1 tablet by mouth at bedtime.   Yes Historical Provider, MD  docusate sodium (COLACE) 100 MG capsule Take 100 mg by mouth daily.   Yes Historical Provider, MD  gabapentin (NEURONTIN) 100 MG capsule Take 100 mg by mouth at bedtime.    Yes Historical Provider, MD  HYDROcodone-acetaminophen (NORCO) 10-325 MG tablet Take one tablet by mouth every 8 hours as needed for moderate pain. Do not exceed 4gm of Tylenol in 24 hours 09/15/15  Yes Gildardo Cranker, DO  midodrine (PROAMATINE) 5 MG tablet Take 5 mg by mouth 3 (three) times daily. Do not take 6 am dose on M- W- F- d/t  dialysis days. 02/01/15  Yes Historical Provider,  MD  ondansetron (ZOFRAN) 4 MG tablet Take 4 mg by mouth every 6 (six) hours as needed for nausea.   Yes Historical Provider, MD  sevelamer carbonate (RENVELA) 800 MG tablet Take 800 mg by mouth daily with lunch. 12 pm and 5 pm.   Yes Historical Provider, MD  sorbitol 70 % solution Take 30 mLs by mouth daily as needed (severe constipation).   Yes Historical Provider, MD   Current Facility-Administered Medications  Medication Dose Route Frequency Provider Last Rate Last Dose  . 0.9 %  sodium chloride infusion   Intravenous Continuous Alric Seton, PA-C 10 mL/hr  at 11/08/15 0756    . 0.9 %  sodium chloride infusion  100 mL Intravenous PRN Alric Seton, PA-C      . 0.9 %  sodium chloride infusion  100 mL Intravenous PRN Alric Seton, PA-C      . acetaminophen (TYLENOL) suppository 650 mg  650 mg Rectal Q4H PRN Lavina Hamman, MD      . albuterol (PROVENTIL) (2.5 MG/3ML) 0.083% nebulizer solution 2.5 mg  2.5 mg Nebulization Q2H PRN Rondell A Tamala Julian, MD      . alteplase (CATHFLO ACTIVASE) injection 2 mg  2 mg Intracatheter Once PRN Alric Seton, PA-C      . HYDROmorphone (DILAUDID) injection 1 mg  1 mg Intravenous Q3H PRN Norval Morton, MD   1 mg at 11/07/15 1509  . lidocaine (PF) (XYLOCAINE) 1 % injection 5 mL  5 mL Intradermal PRN Alric Seton, PA-C      . lidocaine-prilocaine (EMLA) cream 1 application  1 application Topical PRN Alric Seton, PA-C      . ondansetron [facility]) tablet 4 mg  4 mg Oral Q6H PRN Norval Morton, MD       Or  . ondansetron (ZOFRAN) injection 4 mg  4 mg Intravenous Q6H PRN Norval Morton, MD   4 mg at 11/07/15 2132  . pantoprazole (PROTONIX) injection 40 mg  40 mg Intravenous Q24H Lavina Hamman, MD   40 mg at 11/07/15 1034  . pentafluoroprop-tetrafluoroeth (GEBAUERS) aerosol 1 application  1 application Topical PRN Alric Seton, PA-C      . piperacillin-tazobactam (ZOSYN) IVPB 2.25 g  2.25 g Intravenous Q8H Darl Pikes, RPH   2.25 g at 11/08/15 [id number]    Labs: Basic Metabolic Panel:  Recent Labs Lab 11/06/15 2350 11/07/15 0523 11/08/15 0518 11/08/15 0718  NA 134* 136 137 136  K 4.2 4.2 4.8 4.9  CL 97* 96* 99* 98*  CO2 24 24 24 24   GLUCOSE 142* 171* 91 93  BUN 23* 28* 36* 36*  CREATININE 5.37* 5.81* 6.94* 6.90*  CALCIUM 8.4* 8.5* 7.8* 7.7*  PHOS 2.4*  --   --  3.2   Liver Function Tests:  Recent Labs Lab 11/06/15 2350 11/07/15 0523 11/08/15 0518 11/08/15 0718  AST 20 20 16   --   ALT 11* 11* 11*  --   ALKPHOS 195* 191* 165*  --   BILITOT 1.2 1.5* 1.0  --   PROT 8.3* 8.0 7.3  --   ALBUMIN 3.9 3.6 3.3* 3.2*    Recent Labs Lab 11/06/15 2350  LIPASE 105*   CBC:  Recent Labs Lab 11/06/15 2350 11/07/15 0523 11/08/15 0518  WBC 4.4 4.4 4.6  NEUTROABS 3.6  --  3.3  HGB 11.3* 11.1* 10.6*  HCT 35.4* 35.6* 33.6*  MCV 82.7 84.0 83.6  PLT 93* 103* 105*  CBG:  Recent Labs Lab 11/07/15 0759 11/07/15 1219 11/07/15 1648  GLUCAP 141* 154* 126*   Studies/Results: Ct Abdomen Pelvis Wo Contrast  11/07/2015  CLINICAL DATA:  Abdominal pain, elevated lipase. Right upper quadrant pain. EXAM: CT ABDOMEN AND PELVIS WITHOUT CONTRAST TECHNIQUE: Multidetector CT imaging of the abdomen and pelvis was performed following the standard protocol without IV contrast. COMPARISON:  Ultrasound earlier this day.  CT 05/06/2014 FINDINGS: Lower chest: Extensive coronary artery calcifications. Calcifications at the lung hila, partially included. Probable bilateral calcified hilar lymph nodes, suboptimally assessed. Atelectasis at the lung bases. Liver: No evidence of focal lesion allowing for lack contrast. Hepatobiliary: Distended gallbladder containing  dependent gallstones. Previously assessed by ultrasound. Pancreas: Suboptimally assessed without contrast, however there is diffuse soft tissue fullness. Mild peripancreatic edema. Spleen: Enlarged measuring 15.4 cm craniocaudal. Adrenal glands: Fullness bilaterally.  No discrete nodule.  Kidneys: Atrophic parenchyma. Multiple bilateral renal cysts, with a large parapelvic cyst on the left. Some of the cysts appear complicated. Extensive renovascular calcifications. Stomach/Bowel: Stomach distended with ingested contents. Dilated fluid-filled proximal small bowel with transition in the mid right abdomen image 55 series 2. There is mesenteric edema and free fluid. No pneumatosis. More distal small bowel loops are decompressed. Enteric sutures noted at the base of the cecum. There is cecal and ascending colonic wall thickening. Small volume of stool throughout the remainder the colon. Vascular/Lymphatic: Dense atherosclerosis of the abdominal aorta. No aneurysm. No definite retroperitoneal adenopathy. Reproductive: Prominent sized prostate gland causing mass effect on the bladder base. Bladder: Decompressed. Other: Small volume of free fluid in the abdomen and pelvis. No evidence of free air. Postsurgical change in the anterior abdominal wall with small fat containing periumbilical ventral abdominal wall hernia. Musculoskeletal: There are no acute or suspicious osseous abnormalities. 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 Bony fusion of the sacroiliac joints. Degenerative change throughout spine. IMPRESSION: 1. Small-bowel obstruction with transition point in the right mid abdomen. 2. Soft tissue fullness about the entire pancreas. There is mild peripancreatic edema, however diffuse mesenteric edema is also seen. Underlying pancreatic mass cannot be excluded. If patient is able tolerate breath hold technique, MRI may be of value. 3. Splenomegaly. 4. Cholelithiasis, please reference same-day right upper quadrant ultrasound. 5. Advanced atherosclerosis. 6. Additional chronic findings as described. Electronically Signed   By: Jeb Levering M.D.   On: 11/07/2015 05:55   US Abdomen Limited Ruq  11/07/2015  CLINICAL DATA:  Chronic periumbilical pain for 1 month. Initial encounter.  EXAM: US ABDOMEN LIMITED - RIGHT UPPER QUADRANT COMPARISON:  CT of the abdomen and pelvis from 05/03/2014 FINDINGS: Gallbladder: Stones and sludge are noted dependently within the gallbladder. The gallbladder is otherwise unremarkable. No significant gallbladder wall thickening or pericholecystic fluid is seen. No ultrasonographic Murphy's sign is elicited. Common bile duct: Diameter: 0.6 cm, within normal limits in caliber. Liver: No focal lesion identified. Within normal limits in parenchymal echogenicity. IMPRESSION: 1. No acute abnormality seen at the right upper quadrant. 2. Stones and sludge seen dependently within the gallbladder. Gallbladder otherwise unremarkable in appearance. Electronically Signed   By: Garald Balding M.D.   On: 11/07/2015 01:20    ROS: As per HPI otherwise negative.  Physical Exam: Filed Vitals:   11/08/15 0655 11/08/15 0700 11/08/15 0731 11/08/15 0756  BP: 146/75 124/68 129/73 128/74  Pulse: 77 81 81 80  Temp: 98.2 F (36.8 C)     TempSrc: Oral     Resp: 18     Height:      Weight: 72.1 kg (158 lb 15.2 oz)     SpO2: 98%        General: pleasant elderly AA male Head: Normocephalic, atraumatic, sclera non-icteric, mucus membranes are moist Neck: Supple. JVD not elevated. Lungs: Clear bilaterally to auscultation without wheezes, rales, or rhonchi. Breathing is unlabored. Heart: RRR 2/6 murmur Abdomen: Soft, non-distended  + occ bowel sounds. No rebound/guarding. Most tender central/epigastric  (less so than yesterday per pt). Lower extremities: left AKA no edema; right LE multiple scars old calciphylaxis wounds 1 + edema Neuro: Alert and oriented X 3. Moves all extremities spontaneously. Psych:  Responds to questions appropriately with a  normal affect. Dialysis Access: left AVGG  Dialysis Orders:  [place]  4 hr 180 400/800 EDW 73 2 K 2 Ca heparin 5000 profile 4 AVFF Mircera 50 q 4 wks - last 5/3) left upper AVGG  Recent labs:  Hgb 10.8 22% sat - same as April  ferring 1141 April - no Fe was given P 2s Ca ok iPTH 1794 plts - l10s - had ben higher before Dec  Assessment/Plan: 1. Acute pancreatitis w prob ileus (vs SBO)-   In patient with hx prior colectomy for isch bowel and prior SBO 2015- some of what patient attributed to side effect of binders may have been in fact some low grade symptoms related to this; feeling better after BM this am 2. ESRD -  MWF - on HD on 2 K 2 Ca bath - keeping even today - holding heparin b/c no SQ heparin is ordered - normally on  5000 3. BP/volume  - BP 120 - 140 this am - change IVF to [facility] - a little below EDW - keeping even today - avoid BP drops due to hx of isch bowel - has been on midodrine even thought BP not really low - gets +/- EDW at dialysis center 4. Anemia  - Hgb 10.6 - consistent w/ outpt Hgb - continue ESA q 4 weeks - due for redose Wednesday 5. Metabolic bone disease w/ hx calciphylaxis and is s/p parathyroidectomy   outpt P has ben in the 2s - will not resume binders for now due to prone to constipation -off VDRA. He is intolerant of sensipar. 6. Nutrition - npo with sips/chips- ? Advance to CL today 7.   Thrombocytopenia - outpt plts 110s 2017 - had been higher before Dec 2016 - 105 5/29 - follow 8.   Ischemic CM EF 40%/ hx PCI 2011/ L AKA/ hx CVA  [name], PA-C [place] [phone] 11/08/2015, 8:17 AM     Pt seen, examined, agree w assess/plan as above with additions as indicated. ESRD pt with CAD/ CM EF 40%/ hx CVA/ DM/ L AKA presented with abd pain w eating.  CT showed diffuse panc edema, suspected acute pancreatitis. Question of SBO but surg thinks more likely ileus related to panc inflammation.  Feels better here not eating.  Plan HD today [name] MD [facility] pager [phone]    cell [phone] 11/08/2015, 10:58 AM

## 2015-11-09 ENCOUNTER — Encounter (HOSPITAL_COMMUNITY): Payer: Self-pay | Admitting: Physician Assistant

## 2015-11-09 ENCOUNTER — Other Ambulatory Visit: Payer: Self-pay | Admitting: Interventional Cardiology

## 2015-11-09 DIAGNOSIS — K5660 Unspecified intestinal obstruction: Secondary | ICD-10-CM

## 2015-11-09 DIAGNOSIS — I5022 Chronic systolic (congestive) heart failure: Secondary | ICD-10-CM | POA: Insufficient documentation

## 2015-11-09 DIAGNOSIS — K859 Acute pancreatitis without necrosis or infection, unspecified: Secondary | ICD-10-CM

## 2015-11-09 DIAGNOSIS — K8020 Calculus of gallbladder without cholecystitis without obstruction: Secondary | ICD-10-CM

## 2015-11-09 LAB — COMPREHENSIVE METABOLIC PANEL
ALBUMIN: 3 g/dL — AB (ref 3.5–5.0)
ALK PHOS: 141 U/L — AB (ref 38–126)
ALT: 11 U/L — AB (ref 17–63)
AST: 18 U/L (ref 15–41)
Anion gap: 19 — ABNORMAL HIGH (ref 5–15)
BUN: 19 mg/dL (ref 6–20)
CALCIUM: 7.7 mg/dL — AB (ref 8.9–10.3)
CO2: 22 mmol/L (ref 22–32)
CREATININE: 4.57 mg/dL — AB (ref 0.61–1.24)
Chloride: 97 mmol/L — ABNORMAL LOW (ref 101–111)
GFR calc Af Amer: 13 mL/min — ABNORMAL LOW (ref >60)
GFR calc non Af Amer: 11 mL/min — ABNORMAL LOW (ref >60)
GLUCOSE: 60 mg/dL — AB (ref 65–99)
Potassium: 3.6 mmol/L (ref 3.5–5.1)
SODIUM: 138 mmol/L (ref 135–145)
Total Bilirubin: 1.6 mg/dL — ABNORMAL HIGH (ref 0.3–1.2)
Total Protein: 6.8 g/dL (ref 6.5–8.1)

## 2015-11-09 LAB — CBC WITH DIFFERENTIAL/PLATELET
BASOS PCT: 1 %
Basophils Absolute: 0 10*3/uL (ref 0.0–0.1)
EOS ABS: 0.1 10*3/uL (ref 0.0–0.7)
Eosinophils Relative: 1 %
HCT: 32.3 % — ABNORMAL LOW (ref 39.0–52.0)
HEMOGLOBIN: 9.9 g/dL — AB (ref 13.0–17.0)
LYMPHS ABS: 0.8 10*3/uL (ref 0.7–4.0)
Lymphocytes Relative: 20 %
MCH: 26.3 pg (ref 26.0–34.0)
MCHC: 30.7 g/dL (ref 30.0–36.0)
MCV: 85.7 fL (ref 78.0–100.0)
Monocytes Absolute: 0.5 10*3/uL (ref 0.1–1.0)
Monocytes Relative: 12 %
NEUTROS ABS: 2.7 10*3/uL (ref 1.7–7.7)
NEUTROS PCT: 66 %
Platelets: 86 10*3/uL — ABNORMAL LOW (ref 150–400)
RBC: 3.77 MIL/uL — AB (ref 4.22–5.81)
RDW: 16 % — ABNORMAL HIGH (ref 11.5–15.5)
WBC: 4.1 10*3/uL (ref 4.0–10.5)

## 2015-11-09 LAB — GLUCOSE, CAPILLARY: Glucose-Capillary: 172 mg/dL — ABNORMAL HIGH (ref 65–99)

## 2015-11-09 LAB — MAGNESIUM: Magnesium: 2 mg/dL (ref 1.7–2.4)

## 2015-11-09 MED ORDER — DARBEPOETIN ALFA 40 MCG/0.4ML IJ SOSY
40.0000 ug | PREFILLED_SYRINGE | INTRAMUSCULAR | Status: DC
  Administered 2015-11-10: 40 ug via INTRAVENOUS
  Filled 2015-11-09: qty 0.4

## 2015-11-09 MED ORDER — TRAMADOL HCL 50 MG PO TABS: 50.0000 mg | ORAL_TABLET | Freq: Four times a day (QID) | ORAL | Status: DC | PRN

## 2015-11-09 MED ORDER — BOOST / RESOURCE BREEZE PO LIQD
1.0000 | Freq: Two times a day (BID) | ORAL | Status: DC
  Administered 2015-11-09: 1 via ORAL

## 2015-11-09 MED ORDER — HYDROMORPHONE HCL 1 MG/ML IJ SOLN: 0.5000 mg | INTRAMUSCULAR | Status: DC | PRN

## 2015-11-09 NOTE — Progress Notes (Signed)
Daily Rounding Note  11/09/2015, 9:15 AM  LOS: 2 days   SUBJECTIVE:       No abdominal pain or n/v.  Small liquid stool yesterday.  Passing flatus this AM.  Hungry.    OBJECTIVE:         Vital signs in last 24 hours:    Temp:  [98 F (36.7 C)-99 F (37.2 C)] 98.2 F (36.8 C) (05/30 0912) Pulse Rate:  [88-99] 91 (05/30 0912) Resp:  [16-20] 18 (05/30 0912) BP: (122-160)/(48-90) 149/67 mmHg (05/30 0912) SpO2:  [95 %-100 %] 100 % (05/30 0912) Weight:  [70.8 kg (156 lb 1.4 oz)-71.4 kg (157 lb 6.5 oz)] 70.8 kg (156 lb 1.4 oz) (05/29 2012) Last BM Date: 11/08/15 Filed Weights   11/08/15 0655 11/08/15 1055 11/08/15 2012  Weight: 72.1 kg (158 lb 15.2 oz) 71.4 kg (157 lb 6.5 oz) 70.8 kg (156 lb 1.4 oz)   General: pleasant, looks well, comfortable   Heart: RRR.  No MRG Chest: clear bil.  No dyspnea or cough.  Abdomen: soft, NT, ND.  Active BS  Extremities: no CCE.  Fistula in left arm.  AKA  Neuro/Psych:  Oriented x 3.  Fully alert, talkative.  No gross deficits.    Intake/Output from previous day:    Intake/Output this shift:    Lab Results:  Recent Labs  11/07/15 0523 11/08/15 0518 11/09/15 0547  WBC 4.4 4.6 4.1  HGB 11.1* 10.6* 9.9*  HCT 35.6* 33.6* 32.3*  PLT 103* 105* 86*   BMET  Recent Labs  11/08/15 0518 11/08/15 0718 11/09/15 0547  NA 137 136 138  K 4.8 4.9 3.6  CL 99* 98* 97*  CO2 24 24 22   GLUCOSE 91 93 60*  BUN 36* 36* 19  CREATININE 6.94* 6.90* 4.57*  CALCIUM 7.8* 7.7* 7.7*   LFT  Recent Labs  11/07/15 0523 11/08/15 0518 11/08/15 0718 11/09/15 0547  PROT 8.0 7.3  --  6.8  ALBUMIN 3.6 3.3* 3.2* 3.0*  AST 20 16  --  18  ALT 11* 11*  --  11*  ALKPHOS 191* 165*  --  141*  BILITOT 1.5* 1.0  --  1.6*   PT/INR No results for input(s): LABPROT, INR in the last 72 hours. Hepatitis Panel  Recent Labs  11/08/15 0729  HEPBSAG Negative    Studies/Results: Dg Abd 2  Views  11/08/2015  CLINICAL DATA:  Small-bowel obstruction EXAM: ABDOMEN - 2 VIEW COMPARISON:  CT abdomen 11/07/2015 FINDINGS: Mildly dilated small bowel loops with air-fluid levels. Gas in the transverse colon with air-fluid levels. Upright view does not include the right hemidiaphragm. This would need to be repeated if there is concern of free air. IMPRESSION: Air-fluid levels in large and small bowel may be related to ileus or resolving small bowel obstruction Right hemidiaphragm not included on the upright view. If there is concern of free air, repeat upright view could be performed. Electronically Signed   By: Franchot Gallo M.D.   On: 11/08/2015 15:01   Scheduled Meds: . [START ON 11/10/2015] darbepoetin (ARANESP) injection - DIALYSIS  25 mcg Intravenous Q Wed-HD  . heparin subcutaneous  5,000 Units Subcutaneous Q8H  . pantoprazole (PROTONIX) IV  40 mg Intravenous Q24H   Continuous Infusions: . sodium chloride 10 mL/hr at 11/08/15 0756   PRN Meds:.acetaminophen, albuterol, HYDROmorphone (DILAUDID) injection, ondansetron **OR** ondansetron (ZOFRAN) IV  ASSESMENT:   *  Pancreatitis.  Presume gallstone etiology.  Lipase  105 max, 65 yesterday.  ALK phos elevated, not a reliable marker in pt with ESRD.  T bili 1.6.    *  PSBO vs ileus.   *  Hx cholecystitis 01/2014 managed with perc drain due to poor surgical risk.  Current cardiology assessment is moderate surgical risk, no cardiac contraindication to cholecystectomy.   *  ESRD, on HD.    *  Anemia.  On Mircera q 4 weeks, previous parenteral iron.  As inpt on Aranesp.   *  Thrombocytopenia.  New finding this admission.    PLAN   *  Await Dr Bertrum Sol decision as to lap chole.    *  Surgical PA ordered clear liquids this AM, agree and can advance diet as tolerated.    Azucena Freed  11/09/2015, 9:15 AM Pager: [phone]    Attending physician's note   I have taken an interval history, reviewed the chart and examined the  patient. I agree with the Advanced Practitioner's note, impression and recommendations.  Resolving mild pancreatitis, presumed, but not definite, biliary etiology.  Resolving ileus vs partial SBO.  Cholelithiasis with history of cholecystitis. Cardiac risk is moderate, no cardiac contraindication to cholecystectomy. Lap chole with IOC is the best next step and patient states he will consider proceeding in a few weeks. I recommended that he follow through with outpatient Gen Surgical follow up.  Clear liquids today per Gen Surgery and advance diet per general surgery.  GI signing off, available if needed. Outpatient GI follow up with Dr. Zenovia Jarred as needed.   Lucio Edward, MD [facility] [phone] Mon-Fri 8a-5p [phone] after 5p, weekends, holidays

## 2015-11-09 NOTE — Care Management Important Message (Signed)
Important Message  Patient Details  Name: Sean [name] MRN: [id number] Date of Birth: 01-07-1936   Medicare Important Message Given:  Yes    Loann Quill 11/09/2015, 8:04 AM

## 2015-11-09 NOTE — Progress Notes (Signed)
[facility] Progress Note  Assessment/Plan: 1. Acute pancreatitis ? Gallstone induced w prob ileus (vs SBO)- In patient with hx prior colectomy for isch bowel and prior SBO 2015- some of what patient attributed to side effect of binders may have been in fact some low grade symptoms related to this; feeling better after BM this am; lipase 105 to 65 5/29; cards cleared for surgery; GI MD talked with pt this am; pt not mentally ready - wants to wait a couple weeks; stressed surgery  Is better to have done when not having a flare- needs prompt f/u with gen surgery after d/c 2. ESRD - MWF - on HD on 2 K 2 Ca bath - Next HD Wed 3. BP/volume - kept even on HD Monday - had gotten IVF and avoided BP drops - BP 140s today and not getting midodrine that he had been getting PTA- below edw by bed wts 4. Anemia - Hgb 10.6 > 9.9  continue ESA q 4 weeks - due for redose 5/31^ due to 40 5. 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 He is intolerant of sensipar. 6. Nutrition -Alb 3 advanced to CL - add resource- try to advance diet 7. Thrombocytopenia - outpt plts 110s 2017 - had been higher before Dec 2016 - 105 > 86 5/30 - follow 8. Ischemic CM EF 40%/ hx PCI 2011/ L AKA/ hx CVA  [name], PA-C [facility] Beeper [phone] 11/09/2015,10:19 AM  LOS: 2 days   Pt seen, examined and agree w A/P as above.  [name] MD [facility] pager [phone]    cell [phone] 11/09/2015, 3:35 PM    Subjective:   Doesn't feel like he is ready to have GB out at this time " has a lot on his plate"  - wants to wait a few weeks; not much family support; living in SNF - not entirely pleased with that situation  Objective Filed Vitals:   11/08/15 1651 11/08/15 2012 11/09/15 0515 11/09/15 0912  BP: 143/60 149/85 124/48 149/67   Pulse: 92 99 93 91  Temp: 99 F (37.2 C) 98.7 F (37.1 C) 98.7 F (37.1 C) 98.2 F (36.8 C)  TempSrc: Oral Oral Oral Axillary  Resp: 16 19 20 18   Height:      Weight:  70.8 kg (156 lb 1.4 oz)    SpO2: 97% 99% 95% 100%   Physical Exam General: NAD Heart: RRR Lungs: no rales Abdomen: soft + BS RUQ tenderness Extremities: left AKA no sig edema, LLE tr edema Dialysis Access: left AVGG  Dialysis Orders: [facility] 4 hr 180 400/800 EDW 73 2 K 2 Ca heparin 5000 profile 4 AVFF [facility] 50 q 4 wks - last 5/3) left upper AVGG  Recent labs: Hgb 10.8 22% sat - same as April ferritin 1141 April - no Fe was given P 2s Ca ok iPTH 1794 plts - l10s - had ben higher before Dec  Additional Objective Labs: Basic Metabolic Panel:  Recent Labs Lab 11/06/15 2350  11/08/15 0518 11/08/15 0718 11/09/15 0547  NA 134*  < > 137 136 138  K 4.2  < > 4.8 4.9 3.6  CL 97*  < > 99* 98* 97*  CO2 24  < > 24 24 22   GLUCOSE 142*  < > 91 93 60*  BUN 23*  < >  36* 36* 19  CREATININE 5.37*  < > 6.94* 6.90* 4.57*  CALCIUM 8.4*  < > 7.8* 7.7* 7.7*  PHOS 2.4*  --   --  3.2  --   < > = values in this interval not displayed. Liver Function Tests:  Recent Labs Lab 11/07/15 0523 2015-11-28 0518 11/28/15 0718 11/09/15 0547  AST 20 16  --  18  ALT 11* 11*  --  11*  ALKPHOS 191* 165*  --  141*  BILITOT 1.5* 1.0  --  1.6*  PROT 8.0 7.3  --  6.8  ALBUMIN 3.6 3.3* 3.2* 3.0*    Recent Labs Lab 11/06/15 2350 11/28/2015 1137  LIPASE 105* 65*   CBC:  Recent Labs Lab 11/06/15 2350 11/07/15 0523 November 28, 2015 0518 11/09/15 0547  WBC 4.4 4.4 4.6 4.1  NEUTROABS 3.6  --  3.3 2.7  HGB 11.3* 11.1* 10.6* 9.9*  HCT 35.4* 35.6* 33.6* 32.3*  MCV 82.7 84.0 83.6 85.7  PLT 93* 103* 105* 86*  CBG:  Recent Labs Lab 11/07/15 0759 11/07/15 1219 11/07/15 1648  GLUCAP 141* 154* 126*   Studies/Results: Dg Abd 2 Views  2015-11-28  CLINICAL DATA:  Small-bowel obstruction EXAM: ABDOMEN - 2 VIEW COMPARISON:  CT abdomen  11/07/2015 FINDINGS: Mildly dilated small bowel loops with air-fluid levels. Gas in the transverse colon with air-fluid levels. Upright view does not include the right hemidiaphragm. This would need to be repeated if there is concern of free air. IMPRESSION: Air-fluid levels in large and small bowel may be related to ileus or resolving small bowel obstruction Right hemidiaphragm not included on the upright view. If there is concern of free air, repeat upright view could be performed. Electronically Signed   By: Franchot Gallo M.D.   On: 11/28/2015 15:01   Medications: . sodium chloride 10 mL/hr at 11-28-15 0756   . [START ON 11/10/2015] darbepoetin (ARANESP) injection - DIALYSIS  25 mcg Intravenous Q Wed-HD  . heparin subcutaneous  5,000 Units Subcutaneous Q8H  . pantoprazole (PROTONIX) IV  40 mg Intravenous Q24H

## 2015-11-09 NOTE — Progress Notes (Signed)
Subjective: No CP  No SOB   Objective: Filed Vitals:   11/08/15 1651 11/08/15 2012 11/09/15 0515 11/09/15 0912  BP: 143/60 149/85 124/48 149/67  Pulse: 92 99 93 91  Temp: 99 F (37.2 C) 98.7 F (37.1 C) 98.7 F (37.1 C) 98.2 F (36.8 C)  TempSrc: Oral Oral Oral Axillary  Resp: 16 19 20 18   Height:      Weight:  156 lb 1.4 oz (70.8 kg)    SpO2: 97% 99% 95% 100%   Weight change: -2 lb 11.1 oz (-1.221 kg)  Intake/Output Summary (Last 24 hours) at 11/09/15 1238 Last data filed at 11/09/15 0606  Gross per 24 hour  Intake      0 ml  Output      0 ml  Net      0 ml    General: Alert, awake, oriented x3, in no acute distress Neck:  JVP is normal Heart: Regular rate and rhythm, without murmurs, rubs, gallops.  Lungs: Clear to auscultation.  No rales or wheezes. Exemities:  No edema.   Neuro: Grossly intact, nonfocal.   Lab Results: Results for orders placed or performed during the hospital encounter of 11/06/15 (from the past 24 hour(s))  CBC with Differential/Platelet     Status: Abnormal   Collection Time: 11/09/15  5:47 AM  Result Value Ref Range   WBC 4.1 4.0 - 10.5 K/uL   RBC 3.77 (L) 4.22 - 5.81 MIL/uL   Hemoglobin 9.9 (L) 13.0 - 17.0 g/dL   HCT 32.3 (L) 39.0 - 52.0 %   MCV 85.7 78.0 - 100.0 fL   MCH 26.3 26.0 - 34.0 pg   MCHC 30.7 30.0 - 36.0 g/dL   RDW 16.0 (H) 11.5 - 15.5 %   Platelets 86 (L) 150 - 400 K/uL   Neutrophils Relative % 66 %   Neutro Abs 2.7 1.7 - 7.7 K/uL   Lymphocytes Relative 20 %   Lymphs Abs 0.8 0.7 - 4.0 K/uL   Monocytes Relative 12 %   Monocytes Absolute 0.5 0.1 - 1.0 K/uL   Eosinophils Relative 1 %   Eosinophils Absolute 0.1 0.0 - 0.7 K/uL   Basophils Relative 1 %   Basophils Absolute 0.0 0.0 - 0.1 K/uL  Comprehensive metabolic panel     Status: Abnormal   Collection Time: 11/09/15  5:47 AM  Result Value Ref Range   Sodium 138 135 - 145 mmol/L   Potassium 3.6 3.5 - 5.1 mmol/L   Chloride 97 (L) 101 - 111 mmol/L   CO2 22 22 - 32  mmol/L   Glucose, Bld 60 (L) 65 - 99 mg/dL   BUN 19 6 - 20 mg/dL   Creatinine, Ser 4.57 (H) 0.61 - 1.24 mg/dL   Calcium 7.7 (L) 8.9 - 10.3 mg/dL   Total Protein 6.8 6.5 - 8.1 g/dL   Albumin 3.0 (L) 3.5 - 5.0 g/dL   AST 18 15 - 41 U/L   ALT 11 (L) 17 - 63 U/L   Alkaline Phosphatase 141 (H) 38 - 126 U/L   Total Bilirubin 1.6 (H) 0.3 - 1.2 mg/dL   GFR calc non Af Amer 11 (L) >60 mL/min   GFR calc Af Amer 13 (L) >60 mL/min   Anion gap 19 (H) 5 - 15  Magnesium     Status: None   Collection Time: 11/09/15  5:47 AM  Result Value Ref Range   Magnesium 2.0 1.7 - 2.4 mg/dL    Studies/Results: Dg  Abd 2 Views  11/08/2015  CLINICAL DATA:  Small-bowel obstruction EXAM: ABDOMEN - 2 VIEW COMPARISON:  CT abdomen 11/07/2015 FINDINGS: Mildly dilated small bowel loops with air-fluid levels. Gas in the transverse colon with air-fluid levels. Upright view does not include the right hemidiaphragm. This would need to be repeated if there is concern of free air. IMPRESSION: Air-fluid levels in large and small bowel may be related to ileus or resolving small bowel obstruction Right hemidiaphragm not included on the upright view. If there is concern of free air, repeat upright view could be performed. Electronically Signed   By: Franchot Gallo M.D.   On: 11/08/2015 15:01    Medications:Reviewed  @PROBHOSP @  1  CAD   Pt is without active symptoms  Seen by Liane Comber yesterday  Moderate increased risk for periop event  Would plan to follow. Note that pt wants to delay surgery .   Will be available as needed to follow when he does have surgery   Please call.    2   GI  P wants to delay surgery    3 Chronic systolic CHF  Volumestatus is OK  3 PVOD  S/p L AK  4  ESRD  On HD    LOS: 2 days   Dorris Carnes 11/09/2015, 12:38 PM

## 2015-11-09 NOTE — Progress Notes (Signed)
[facility] Progress Note  Patient: Sean [name] [id number]   PCP: [name], MD DOB: 11/27/1935   DOA: 11/06/2015   DOS: 11/09/2015   Date of Service: the patient was seen and examined on 11/09/2015  Subjective: Patient denies any complaint. Alcohol pain is well controlled. Tolerating clears this morning. Nutrition: He was nothing by mouth  Brief hospital course: Patient was admitted on 11/06/2015, with complaint of abdominal pain, nausea as well as vomiting, was found to have acute pancreatitis secondary to gallstone. Also was found to have ileus secondary to pancreatitis. Patient was revealed by cardiology for preoperative evaluation. Patient mentions that he would like to go home and get gallbladder surgery after a few weeks instead of getting it during the same hospitalization. Currently further plan is continue advancing the diet further.  Assessment and Plan: 1. Gallstone pancreatitis Appreciate input from gastroenterology as well as general surgery. Continue conservative management. Likely ileus as well from gallstone pancreatitis. X-ray abdomen shows resolving small bowel obstruction versus ileus. We will await recommendation from the consultants. Patient will need cholecystectomy prior to discharge. Cardiology has informed that the patient will be moderate risk for cardiovascular outcomes for surgery. I would discontinue antibiotics since there is no evidence of ongoing infection at present. Since the patient is tolerating clear liquid diet advance to full liquid diet for tonight and will advance to soft diet in the morning  2. ESRD. Continue hemodialysis Monday Wednesday Friday as scheduled. No evidence of volume overload.   3  S/P AKA (above knee amputation) unilateral (HCC) Continue to monitor. Will need physical therapy evaluation prior to discharge.  4.  Type 2 diabetes mellitus with chronic kidney disease on chronic dialysis, without long-term current  use of insulin (HCC) Continue sliding scale insulin at present.  Pain management: When necessary Dilaudid when necessary tramadol Activity: Consulted physical therapy Bowel regimen: last BM 11/08/2015 Diet: Clear liquid diet DVT Prophylaxis: subcutaneous Heparin  Advance goals of care discussion: Full code  Family Communication: no family was present at bedside, at the time of interview.   Disposition:  Discharge to likely home, with home health Expected discharge date: 11/10/2015 pending tolerating diet  Consultants: Gastroenterology, general surgery, cardiology Procedures: None  Antibiotics: Anti-infectives    Start     Dose/Rate Route Frequency Ordered Stop   11/07/15 2200  cefTRIAXone (ROCEPHIN) 2 g in dextrose 5 % 50 mL IVPB  Status:  Discontinued     2 g 100 mL/hr over 30 Minutes Intravenous Every 24 hours 11/07/15 0403 11/07/15 0816   11/07/15 0900  piperacillin-tazobactam (ZOSYN) IVPB 2.25 g  Status:  Discontinued     2.25 g 100 mL/hr over 30 Minutes Intravenous Every 8 hours 11/07/15 0830 11/08/15 1837   11/07/15 0200  cefTRIAXone (ROCEPHIN) 2 g in dextrose 5 % 50 mL IVPB     2 g 100 mL/hr over 30 Minutes Intravenous  Once 11/07/15 0145 11/07/15 0318        Intake/Output Summary (Last 24 hours) at 11/09/15 1647 Last data filed at 11/09/15 0606  Gross per 24 hour  Intake      0 ml  Output      0 ml  Net      0 ml   Filed Weights   11/08/15 0655 11/08/15 1055 11/08/15 2012  Weight: 72.1 kg (158 lb 15.2 oz) 71.4 kg (157 lb 6.5 oz) 70.8 kg (156 lb 1.4 oz)    Objective: Physical Exam: Filed Vitals:   11/08/15 1651 11/08/15  2012 11/09/15 0515 11/09/15 0912  BP: 143/60 149/85 124/48 149/67  Pulse: 92 99 93 91  Temp: 99 F (37.2 C) 98.7 F (37.1 C) 98.7 F (37.1 C) 98.2 F (36.8 C)  TempSrc: Oral Oral Oral Axillary  Resp: 16 19 20 18   Height:      Weight:  70.8 kg (156 lb 1.4 oz)    SpO2: 97% 99% 95% 100%    General: Alert, Awake and Oriented to  Time, Place and Person. Appear in mild distress Eyes: PERRL, Conjunctiva normal ENT: Oral Mucosa clear moist. Neck: no JVD, no Abnormal Mass Or lumps Cardiovascular: S1 and S2 Present, aortic systolic  Murmur, Peripheral Pulses Present Respiratory: Bilateral Air entry equal and Decreased, Clear to Auscultation, no Crackles, no wheezes Abdomen: Bowel Sound present, Soft and mild tenderness Skin: no redness, no Rash  Extremities: trace Pedal edema, no calf tenderness Neurologic: Grossly no focal neuro deficit. Bilaterally Equal motor strength  Data Reviewed: CBC:  Recent Labs Lab 11/06/15 2350 11/07/15 0523 11/08/15 0518 11/09/15 0547  WBC 4.4 4.4 4.6 4.1  NEUTROABS 3.6  --  3.3 2.7  HGB 11.3* 11.1* 10.6* 9.9*  HCT 35.4* 35.6* 33.6* 32.3*  MCV 82.7 84.0 83.6 85.7  PLT 93* 103* 105* 86*   Basic Metabolic Panel:  Recent Labs Lab 11/06/15 2350 11/07/15 0523 11/08/15 0518 11/08/15 0718 11/09/15 0547  NA 134* 136 137 136 138  K 4.2 4.2 4.8 4.9 3.6  CL 97* 96* 99* 98* 97*  CO2 24 24 24 24 22   GLUCOSE 142* 171* 91 93 60*  BUN 23* 28* 36* 36* 19  CREATININE 5.37* 5.81* 6.94* 6.90* 4.57*  CALCIUM 8.4* 8.5* 7.8* 7.7* 7.7*  MG 2.2  --  2.1  --  2.0  PHOS 2.4*  --   --  3.2  --     Liver Function Tests:  Recent Labs Lab 11/06/15 2350 11/07/15 0523 11/08/15 0518 11/08/15 0718 11/09/15 0547  AST 20 20 16   --  18  ALT 11* 11* 11*  --  11*  ALKPHOS 195* 191* 165*  --  141*  BILITOT 1.2 1.5* 1.0  --  1.6*  PROT 8.3* 8.0 7.3  --  6.8  ALBUMIN 3.9 3.6 3.3* 3.2* 3.0*    Recent Labs Lab 11/06/15 2350 11/08/15 1137  LIPASE 105* 65*   No results for input(s): AMMONIA in the last 168 hours. Coagulation Profile: No results for input(s): INR, PROTIME in the last 168 hours. Cardiac Enzymes: No results for input(s): CKTOTAL, CKMB, CKMBINDEX, TROPONINI in the last 168 hours. BNP (last 3 results) No results for input(s): PROBNP in the last 8760 hours.  CBG:  Recent  Labs Lab 11/07/15 0333 11/07/15 0759 11/07/15 1219 11/07/15 1648  GLUCAP 172* 141* 154* 126*    Studies: No results found.   Scheduled Meds: . [START ON 11/10/2015] darbepoetin (ARANESP) injection - DIALYSIS  40 mcg Intravenous Q Wed-HD  . feeding supplement  1 Container Oral BID BM  . heparin subcutaneous  5,000 Units Subcutaneous Q8H  . pantoprazole (PROTONIX) IV  40 mg Intravenous Q24H   Continuous Infusions: . sodium chloride 10 mL/hr at 11/08/15 0756   PRN Meds: acetaminophen, albuterol, HYDROmorphone (DILAUDID) injection, ondansetron **OR** ondansetron (ZOFRAN) IV  Time spent: 30 minutes  Author: Berle Mull, MD [facility] Pager: [phone] 11/09/2015 4:47 PM  If 7PM-7AM, please contact night-coverage at [email or web address], password [facility]

## 2015-11-09 NOTE — Progress Notes (Signed)
Patient ID: Sean [name], male   DOB: [birth year], 80 y.o.   MRN: [id number]     [place]      [address] [place], [place] [address]    Phone: [phone] FAX: [phone]     Subjective: ast and alt normal.  TB increased 1.5-->1--->1.6 today  denies any pain. Having BMs.   Objective:  Vital signs:  Filed Vitals:   11/08/15 1651 11/08/15 2012 11/09/15 0515 11/09/15 0912  BP: 143/60 149/85 124/48 149/67  Pulse: 92 99 93 91  Temp: 99 F (37.2 C) 98.7 F (37.1 C) 98.7 F (37.1 C) 98.2 F (36.8 C)  TempSrc: Oral Oral Oral Axillary  Resp: _0 Height:      Weight:  70.8 kg (156 lb 1.4 oz)    SpO2: 97% 99% 95% 100%    Last BM Date: 11/08/15  Intake/Output   Yesterday:    This shift:   Physical Exam: General: Pt awake/alert/oriented x4 in no acute distress  Abdomen: Soft.  Nondistended.  Non tender.  No evidence of peritonitis.  No incarcerated hernias.    Problem List:   Principal Problem:   Gallstone pancreatitis Active Problems:   End stage renal disease on dialysis (HCC)   S/P AKA (above knee amputation) unilateral (HCC)   Type 2 diabetes mellitus with chronic kidney disease on chronic dialysis, without long-term current use of insulin (HCC)   Abdominal pain, epigastric    Results:   Labs: Results for orders placed or performed during the hospital encounter of 11/06/15 (from the past 48 hour(s))  Glucose, capillary     Status: Abnormal   Collection Time: 11/07/15 12:19 PM  Result Value Ref Range   Glucose-Capillary 154 (H) 65 - 99 mg/dL   Comment 1 Notify RN    Comment 2 Document in Chart   Glucose, capillary     Status: Abnormal   Collection Time: 11/07/15  4:48 PM  Result Value Ref Range   Glucose-Capillary 126 (H) 65 - 99 mg/dL   Comment 1 Notify RN    Comment 2 Document in Chart   Comprehensive metabolic panel     Status: Abnormal   Collection Time: 11/08/15  5:18 AM  Result  Value Ref Range   Sodium 137 135 - 145 mmol/L   Potassium 4.8 3.5 - 5.1 mmol/L   Chloride 99 (L) 101 - 111 mmol/L   CO2 24 22 - 32 mmol/L   Glucose, Bld 91 65 - 99 mg/dL   BUN 36 (H) 6 - 20 mg/dL   Creatinine, Ser 6.94 (H) 0.61 - 1.24 mg/dL   Calcium 7.8 (L) 8.9 - 10.3 mg/dL   Total Protein 7.3 6.5 - 8.1 g/dL   Albumin 3.3 (L) 3.5 - 5.0 g/dL   AST 16 15 - 41 U/L   ALT 11 (L) 17 - 63 U/L   Alkaline Phosphatase 165 (H) 38 - 126 U/L   Total Bilirubin 1.0 0.3 - 1.2 mg/dL   GFR calc non Af Amer 7 (L) >60 mL/min   GFR calc Af Amer 8 (L) >60 mL/min    Comment: (NOTE) The eGFR has been calculated using the CKD EPI equation. This calculation has not been validated in all clinical situations. eGFR's persistently <60 mL/min signify possible Chronic Kidney Disease.    Anion gap 14 5 - 15  CBC with Differential/Platelet     Status: Abnormal   Collection Time: 11/08/15  5:18 AM  Result Value Ref Range   WBC 4.6 4.0 - 10.5 K/uL    Comment: REPEATED TO VERIFY WHITE COUNT CONFIRMED ON SMEAR    RBC 4.02 (L) 4.22 - 5.81 MIL/uL   Hemoglobin 10.6 (L) 13.0 - 17.0 g/dL   HCT 33.6 (L) 39.0 - 52.0 %   MCV 83.6 78.0 - 100.0 fL   MCH 26.4 26.0 - 34.0 pg   MCHC 31.5 30.0 - 36.0 g/dL   RDW 15.8 (H) 11.5 - 15.5 %   Platelets 105 (L) 150 - 400 K/uL    Comment: SPECIMEN CHECKED FOR CLOTS REPEATED TO VERIFY PLATELET COUNT CONFIRMED BY SMEAR    Neutrophils Relative % 69 %   Lymphocytes Relative 14 %   Monocytes Relative 16 %   Eosinophils Relative 1 %   Basophils Relative 0 %   Neutro Abs 3.3 1.7 - 7.7 K/uL   Lymphs Abs 0.6 (L) 0.7 - 4.0 K/uL   Monocytes Absolute 0.7 0.1 - 1.0 K/uL   Eosinophils Absolute 0.0 0.0 - 0.7 K/uL   Basophils Absolute 0.0 0.0 - 0.1 K/uL   RBC Morphology ELLIPTOCYTES     Comment: TEARDROP CELLS  Magnesium     Status: None   Collection Time: 11/08/15  5:18 AM  Result Value Ref Range   Magnesium 2.1 1.7 - 2.4 mg/dL  Renal function panel     Status: Abnormal    Collection Time: 11/08/15  7:18 AM  Result Value Ref Range   Sodium 136 135 - 145 mmol/L   Potassium 4.9 3.5 - 5.1 mmol/L   Chloride 98 (L) 101 - 111 mmol/L   CO2 24 22 - 32 mmol/L   Glucose, Bld 93 65 - 99 mg/dL   BUN 36 (H) 6 - 20 mg/dL   Creatinine, Ser 6.90 (H) 0.61 - 1.24 mg/dL   Calcium 7.7 (L) 8.9 - 10.3 mg/dL   Phosphorus 3.2 2.5 - 4.6 mg/dL   Albumin 3.2 (L) 3.5 - 5.0 g/dL   GFR calc non Af Amer 7 (L) >60 mL/min   GFR calc Af Amer 8 (L) >60 mL/min    Comment: (NOTE) The eGFR has been calculated using the CKD EPI equation. This calculation has not been validated in all clinical situations. eGFR's persistently <60 mL/min signify possible Chronic Kidney Disease.    Anion gap 14 5 - 15  Hepatitis B surface antigen     Status: None   Collection Time: 11/08/15  7:29 AM  Result Value Ref Range   Hepatitis B Surface Ag Negative Negative    Comment: (NOTE) Called and faxed to [facility] 01:18 PM 11/08/2015 Doyle Askew Performed At: [facility] [place], [place] [id number] Lindon Romp MD HC:[id number]   Lipase, blood     Status: Abnormal   Collection Time: 11/08/15 11:37 AM  Result Value Ref Range   Lipase 65 (H) 11 - 51 U/L  CBC with Differential/Platelet     Status: Abnormal   Collection Time: 11/09/15  5:47 AM  Result Value Ref Range   WBC 4.1 4.0 - 10.5 K/uL   RBC 3.77 (L) 4.22 - 5.81 MIL/uL   Hemoglobin 9.9 (L) 13.0 - 17.0 g/dL   HCT 32.3 (L) 39.0 - 52.0 %   MCV 85.7 78.0 - 100.0 fL   MCH 26.3 26.0 - 34.0 pg   MCHC 30.7 30.0 - 36.0 g/dL   RDW 16.0 (H) 11.5 - 15.5 %   Platelets 86 (L) 150 - 400 K/uL  Comment: CONSISTENT WITH PREVIOUS RESULT   Neutrophils Relative % 66 %   Neutro Abs 2.7 1.7 - 7.7 K/uL   Lymphocytes Relative 20 %   Lymphs Abs 0.8 0.7 - 4.0 K/uL   Monocytes Relative 12 %   Monocytes Absolute 0.5 0.1 - 1.0 K/uL   Eosinophils Relative 1 %   Eosinophils Absolute 0.1 0.0 - 0.7 K/uL   Basophils Relative 1 %   Basophils  Absolute 0.0 0.0 - 0.1 K/uL  Comprehensive metabolic panel     Status: Abnormal   Collection Time: 11/09/15  5:47 AM  Result Value Ref Range   Sodium 138 135 - 145 mmol/L   Potassium 3.6 3.5 - 5.1 mmol/L   Chloride 97 (L) 101 - 111 mmol/L   CO2 22 22 - 32 mmol/L   Glucose, Bld 60 (L) 65 - 99 mg/dL   BUN 19 6 - 20 mg/dL   Creatinine, Ser 4.57 (H) 0.61 - 1.24 mg/dL    Comment: RESULT REPEATED AND VERIFIED   Calcium 7.7 (L) 8.9 - 10.3 mg/dL   Total Protein 6.8 6.5 - 8.1 g/dL   Albumin 3.0 (L) 3.5 - 5.0 g/dL   AST 18 15 - 41 U/L   ALT 11 (L) 17 - 63 U/L   Alkaline Phosphatase 141 (H) 38 - 126 U/L   Total Bilirubin 1.6 (H) 0.3 - 1.2 mg/dL   GFR calc non Af Amer 11 (L) >60 mL/min   GFR calc Af Amer 13 (L) >60 mL/min    Comment: (NOTE) The eGFR has been calculated using the CKD EPI equation. This calculation has not been validated in all clinical situations. eGFR's persistently <60 mL/min signify possible Chronic Kidney Disease.    Anion gap 19 (H) 5 - 15  Magnesium     Status: None   Collection Time: 11/09/15  5:47 AM  Result Value Ref Range   Magnesium 2.0 1.7 - 2.4 mg/dL    Imaging / Studies: Dg Abd 2 Views  11/08/2015  CLINICAL DATA:  Small-bowel obstruction EXAM: ABDOMEN - 2 VIEW COMPARISON:  CT abdomen 11/07/2015 FINDINGS: Mildly dilated small bowel loops with air-fluid levels. Gas in the transverse colon with air-fluid levels. Upright view does not include the right hemidiaphragm. This would need to be repeated if there is concern of free air. IMPRESSION: Air-fluid levels in large and small bowel may be related to ileus or resolving small bowel obstruction Right hemidiaphragm not included on the upright view. If there is concern of free air, repeat upright view could be performed. Electronically Signed   By: Franchot Gallo M.D.   On: 11/08/2015 15:01    Medications / Allergies:  Scheduled Meds: . [START ON 11/10/2015] darbepoetin (ARANESP) injection - DIALYSIS  25 mcg  Intravenous Q Wed-HD  . heparin subcutaneous  5,000 Units Subcutaneous Q8H  . pantoprazole (PROTONIX) IV  40 mg Intravenous Q24H   Continuous Infusions: . sodium chloride 10 mL/hr at 11/08/15 0756   PRN Meds:.acetaminophen, albuterol, HYDROmorphone (DILAUDID) injection, ondansetron **OR** ondansetron (ZOFRAN) IV  Antibiotics: Anti-infectives    Start     Dose/Rate Route Frequency Ordered Stop   11/07/15 2200  cefTRIAXone (ROCEPHIN) 2 g in dextrose 5 % 50 mL IVPB  Status:  Discontinued     2 g 100 mL/hr over 30 Minutes Intravenous Every 24 hours 11/07/15 0403 11/07/15 0816   11/07/15 0900  piperacillin-tazobactam (ZOSYN) IVPB 2.25 g  Status:  Discontinued     2.25 g 100 mL/hr over 30 Minutes  Intravenous Every 8 hours 11/07/15 0830 11/08/15 1837   11/07/15 0200  cefTRIAXone (ROCEPHIN) 2 g in dextrose 5 % 50 mL IVPB     2 g 100 mL/hr over 30 Minutes Intravenous  Once 11/07/15 0145 11/07/15 0318        Assessment/Plan Gallstone pancreatitis Hx cholecystitis requiring cholecystostomy tube Benign abdominal exam, lipase down.  Recommend surgery this admission, at moderate risk per cardiology assessment.  The patient is declining surgery at this time.  States he wants to take a few weeks "to get my mind right" and then have the surgery.  We discussed the risk of recurrent or worsening symptoms which occurs about 20-30% with interval cholecystectomy.  He verbalizes understanding. Will therefore allow for clears. ESRD-HD  S/p AKA SBO v ileus-resolving, clears VTE prophylaxis-SCD/heparin  Erby Pian, [facility] Surgery Pager [phone]) For consults and floor pages call [phone](7A-4:30P)  11/09/2015 9:37 AM

## 2015-11-10 LAB — COMPREHENSIVE METABOLIC PANEL
ALK PHOS: 135 U/L — AB (ref 38–126)
ALT: 11 U/L — AB (ref 17–63)
AST: 19 U/L (ref 15–41)
Albumin: 3 g/dL — ABNORMAL LOW (ref 3.5–5.0)
Anion gap: 14 (ref 5–15)
BUN: 28 mg/dL — AB (ref 6–20)
CALCIUM: 7.6 mg/dL — AB (ref 8.9–10.3)
CHLORIDE: 98 mmol/L — AB (ref 101–111)
CO2: 26 mmol/L (ref 22–32)
CREATININE: 6.41 mg/dL — AB (ref 0.61–1.24)
GFR calc Af Amer: 9 mL/min — ABNORMAL LOW (ref >60)
GFR calc non Af Amer: 7 mL/min — ABNORMAL LOW (ref >60)
GLUCOSE: 89 mg/dL (ref 65–99)
Potassium: 3.8 mmol/L (ref 3.5–5.1)
SODIUM: 138 mmol/L (ref 135–145)
Total Bilirubin: 1.2 mg/dL (ref 0.3–1.2)
Total Protein: 6.7 g/dL (ref 6.5–8.1)

## 2015-11-10 LAB — CBC WITH DIFFERENTIAL/PLATELET
BASOS ABS: 0 10*3/uL (ref 0.0–0.1)
Basophils Relative: 0 %
EOS ABS: 0 10*3/uL (ref 0.0–0.7)
EOS PCT: 1 %
HCT: 30.8 % — ABNORMAL LOW (ref 39.0–52.0)
HEMOGLOBIN: 9.5 g/dL — AB (ref 13.0–17.0)
LYMPHS ABS: 0.6 10*3/uL — AB (ref 0.7–4.0)
LYMPHS PCT: 21 %
MCH: 26 pg (ref 26.0–34.0)
MCHC: 30.8 g/dL (ref 30.0–36.0)
MCV: 84.2 fL (ref 78.0–100.0)
Monocytes Absolute: 0.4 10*3/uL (ref 0.1–1.0)
Monocytes Relative: 16 %
NEUTROS PCT: 62 %
Neutro Abs: 1.7 10*3/uL (ref 1.7–7.7)
PLATELETS: 91 10*3/uL — AB (ref 150–400)
RBC: 3.66 MIL/uL — AB (ref 4.22–5.81)
RDW: 15.8 % — ABNORMAL HIGH (ref 11.5–15.5)
WBC: 2.8 10*3/uL — AB (ref 4.0–10.5)

## 2015-11-10 LAB — RENAL FUNCTION PANEL
Albumin: 3 g/dL — ABNORMAL LOW (ref 3.5–5.0)
Anion gap: 12 (ref 5–15)
BUN: 27 mg/dL — ABNORMAL HIGH (ref 6–20)
CO2: 26 mmol/L (ref 22–32)
Calcium: 7.5 mg/dL — ABNORMAL LOW (ref 8.9–10.3)
Chloride: 98 mmol/L — ABNORMAL LOW (ref 101–111)
Creatinine, Ser: 6.38 mg/dL — ABNORMAL HIGH (ref 0.61–1.24)
GFR calc Af Amer: 9 mL/min — ABNORMAL LOW (ref >60)
GFR calc non Af Amer: 7 mL/min — ABNORMAL LOW (ref >60)
Glucose, Bld: 88 mg/dL (ref 65–99)
Phosphorus: 3.2 mg/dL (ref 2.5–4.6)
Potassium: 3.7 mmol/L (ref 3.5–5.1)
Sodium: 136 mmol/L (ref 135–145)

## 2015-11-10 LAB — MAGNESIUM: Magnesium: 2.1 mg/dL (ref 1.7–2.4)

## 2015-11-10 MED ORDER — HYDROCODONE-ACETAMINOPHEN 10-325 MG PO TABS: ORAL_TABLET | ORAL | Status: DC

## 2015-11-10 MED ORDER — SODIUM CHLORIDE 0.9 % IV SOLN: 100.0000 mL | INTRAVENOUS | Status: DC | PRN

## 2015-11-10 MED ORDER — PENTAFLUOROPROP-TETRAFLUOROETH EX AERO: 1.0000 "application " | INHALATION_SPRAY | CUTANEOUS | Status: DC | PRN

## 2015-11-10 MED ORDER — LIDOCAINE HCL (PF) 1 % IJ SOLN: 5.0000 mL | INTRAMUSCULAR | Status: DC | PRN

## 2015-11-10 MED ORDER — HEPARIN SODIUM (PORCINE) 1000 UNIT/ML DIALYSIS: 1000.0000 [IU] | INTRAMUSCULAR | Status: DC | PRN

## 2015-11-10 MED ORDER — HEPARIN SODIUM (PORCINE) 1000 UNIT/ML DIALYSIS: 20.0000 [IU]/kg | INTRAMUSCULAR | Status: DC | PRN

## 2015-11-10 MED ORDER — LIDOCAINE-PRILOCAINE 2.5-2.5 % EX CREA: 1.0000 "application " | TOPICAL_CREAM | CUTANEOUS | Status: DC | PRN

## 2015-11-10 MED ORDER — DARBEPOETIN ALFA 40 MCG/0.4ML IJ SOSY
PREFILLED_SYRINGE | INTRAMUSCULAR | Status: AC
  Filled 2015-11-10: qty 0.4

## 2015-11-10 MED ORDER — ALTEPLASE 2 MG IJ SOLR: 2.0000 mg | Freq: Once | INTRAMUSCULAR | Status: DC | PRN

## 2015-11-10 NOTE — Evaluation (Signed)
Physical Therapy Evaluation Patient Details Name: Sean [name] MRN: [id number] DOB: 1936-03-22 Today's Date: 11/10/2015   History of Present Illness  Sean [name] is a 81 y.o. male with medical history significant of ischemic colitis s/p bowel resection, cholelithiasis, DM2, ESRD on HD, caciphylaxis, s/p Left AKA, hypotension, HLD, anemia, DM2, depression; who presents with one-month history of abdominal pain. Patient describes pain as "griping" is located more so epigastrically with waxing and waning severity. Gallstone pancreatitis; plans to go back home and follow up with Surgery re: possible cholecystectomy  Clinical Impression   Pt admitted with above diagnosis. Pt currently with functional limitations due to the deficits listed below (see PT Problem List).  Pt will benefit from skilled PT to increase their independence and safety with mobility to allow discharge to the venue listed below.    We had a lengthy dicussion re; dc planning and goals; At this point, I recommend continued rehab at [facility], follow up re: gall bladder and possible surgery, recover at SNF with continued rehab, and then pursue his longer-term goal which is to have more independence at more af an Assisted Living-type facility.     Follow Up Recommendations SNF    Equipment Recommendations  Other (comment) (TBD at next venue of care)    Recommendations for Other Services       Precautions / Restrictions Precautions Precautions: Fall Restrictions Weight Bearing Restrictions: No      Mobility  Bed Mobility               General bed mobility comments: Sitting EOB   Transfers Overall transfer level: Needs assistance Equipment used: None Transfers: Stand Pivot Transfers   Stand pivot transfers: Min guard       General transfer comment: Performed stand pivot transfer towards his intact limb side; minguard for safety; did not need physical assist  Ambulation/Gait                 Stairs            Wheelchair Mobility    Modified Rankin (Stroke Patients Only)       Balance                                             Pertinent Vitals/Pain Pain Assessment: Faces Faces Pain Scale: Hurts a little bit Pain Location: abdominal soreness Pain Descriptors / Indicators: Discomfort Pain Intervention(s): Monitored during session    Home Living Family/patient expects to be discharged to:: Skilled nursing facility                      Prior Function Level of Independence: Needs assistance   Gait / Transfers Assistance Needed: Has been working with PT at [facility] on walking with prosthesis  ADL's / Homemaking Assistance Needed: Bath Aide occasionally at [employer]        Extremity/Trunk Assessment   Upper Extremity Assessment:  (Noted decr dexterity bil hands)           Lower Extremity Assessment: LLE deficits/detail   LLE Deficits / Details: AKA; reports he still experiences phantom sensations     Communication   Communication: HOH;No difficulties  Cognition Arousal/Alertness: Awake/alert Behavior During Therapy: WFL for tasks assessed/performed Overall Cognitive Status: Within Functional Limits for tasks assessed  General Comments General comments (skin integrity, edema, etc.): Lenghty discussion re: dc planning and lon-term planning    Exercises        Assessment/Plan    PT Assessment Patient needs continued PT services  PT Diagnosis Generalized weakness;Difficulty walking   PT Problem List Decreased activity tolerance;Decreased balance;Decreased mobility;Decreased knowledge of use of DME  PT Treatment Interventions DME instruction;Gait training;Functional mobility training;Therapeutic activities;Therapeutic exercise;Patient/family education;Balance training   PT Goals (Current goals can be found in the Care Plan section) Acute Rehab PT Goals Patient  Stated Goal: [name] long term goal is to get to an Assisted Living facility PT Goal Formulation: With patient Time For Goal Achievement: 11/24/15 Potential to Achieve Goals: Good    Frequency Min 2X/week   Barriers to discharge        Co-evaluation               End of Session Equipment Utilized During Treatment: Gait belt Activity Tolerance: Patient tolerated treatment well Patient left: in chair;with call bell/phone within reach;with chair alarm set Nurse Communication: Mobility status         Time: [id number] PT Time Calculation (min) (ACUTE ONLY): 27 min   Charges:   PT Evaluation $PT Eval Moderate Complexity: 1 Procedure PT Treatments $Therapeutic Activity: 8-22 mins   PT G CodesQuin Hoop 11/10/2015, 3:02 PM  Roney Marion, [place] Pager [phone] Office [phone]

## 2015-11-10 NOTE — Discharge Summary (Signed)
Physician Discharge Summary  Sean [name] [id number] DOB: May 26, 1936 DOA: 11/06/2015  PCP: [name], MD  Admit date: 11/06/2015 Discharge date: 11/10/2015  Time spent: > 30 minutes  Recommendations for Outpatient Follow-up:  1. Follow up with outpatient HD as scheduled 2. Follow up with Dr. Redmond Pulling in ~ 2 weeks   Discharge Diagnoses:  Principal Problem:   Gallstone pancreatitis Active Problems:   End stage renal disease on dialysis ([place])   S/P AKA (above knee amputation) unilateral (H[facility])   Type 2 diabetes mellitus with chronic kidney disease on chronic dialysis, without long-term current use of insulin (HCC)   Abdominal pain, epigastric   Chronic systolic CHF (congestive heart failure) ([place])   Discharge Condition: stable  Diet recommendation: renal  Filed Weights   11/08/15 2012 11/09/15 2052 11/10/15 0800  Weight: 70.8 kg (156 lb 1.4 oz) 72 kg (158 lb 11.7 oz) 71.8 kg (158 lb 4.6 oz)    History of present illness:  Sean [name] is a 80 y.o. male with medical history significant of ischemic colitis s/p bowel resection, cholelithiasis, DM2, ESRD on HD, caciphylaxis, s/p Left AKA, hypotension, HLD, anemia, DM2, depression; who presents with one-month history of abdominal pain. Patient describes pain as "griping" is located more so epigastrically with waxing and waning severity. Following eating a meal symptoms appeared to worsen Pain symptoms previously would just occur intermittently, but symptoms worsened today and the pain has been more constant. Associated symptoms include constipation, heartburn, nausea. Patient notes that he previously had amputation of his left lower extremity back in 2015 for which she was also found to have acute cholecystitis at that time but was deemed a poor surgical candidate for which he underwent a percutaneous drain with interventional radiology. Patient denies any vomiting, diarrhea, blood in stool/urine  Hospital Course:    Gallstone pancreatitis / pSBO / ileus - gastroenterology and general surgery were consulted and have followed patient while hospitalized. General surgery evaluated patient for cholecystectomy however patient declined the surgery for now and will think about it in few weeks, will follow up with surgery as an outpatient. His ileus resolved, patient is able to tolerate a renal diet without abdominal pain, nausea or vomiting and is having BMs. X-ray abdomen shows resolving small bowel obstruction versus ileus. ESRD - Continue hemodialysis Monday Wednesday Friday as scheduled. No evidence of volume overload. S/P AKA (above knee amputation) unilateral (HCC) Type 2 diabetes mellitus with chronic kidney disease on chronic dialysis, without long-term current use of insulin (HCC) CAD / Chronic combined systolic and diastolic CHF - this is stable, no chest pain, patient is euvolemic. Cardiology was consulted for pre-op evaluation and was determined to be moderate risk.   Procedures:  None    Consultations:  Cardiology   GI  General Surgery  Discharge Exam: Filed Vitals:   11/10/15 0900 11/10/15 0930 11/10/15 1000 11/10/15 1030  BP: 153/72 129/64 142/71 122/68  Pulse: 71 71 75 78  Temp:      TempSrc:      Resp:      Height:      Weight:      SpO2:        General: NAD Cardiovascular: RRR Respiratory: CTA biL  Discharge Instructions Activity:  As tolerated   Get Medicines reviewed and adjusted: Please take all your medications with you for your next visit with your Primary MD  Please request your Primary MD to go over all hospital tests and procedure/radiological results at the follow up,  please ask your Primary MD to get all Hospital records sent to his/her office.  If you experience worsening of your admission symptoms, develop shortness of breath, life threatening emergency, suicidal or homicidal thoughts you must seek medical attention immediately by calling 911 or calling your  MD immediately if symptoms less severe.  You must read complete instructions/literature along with all the possible adverse reactions/side effects for all the Medicines you take and that have been prescribed to you. Take any new Medicines after you have completely understood and accpet all the possible adverse reactions/side effects.   Do not drive when taking Pain medications.   Do not take more than prescribed Pain, Sleep and Anxiety Medications  Special Instructions: If you have smoked or chewed Tobacco in the last 2 yrs please stop smoking, stop any regular Alcohol and or any Recreational drug use.  Wear Seat belts while driving.  Please note  You were cared for by a hospitalist during your hospital stay. Once you are discharged, your primary care physician will handle any further medical issues. Please note that NO REFILLS for any discharge medications will be authorized once you are discharged, as it is imperative that you return to your primary care physician (or establish a relationship with a primary care physician if you do not have one) for your aftercare needs so that they can reassess your need for medications and monitor your lab values.    Medication List    TAKE these medications        acetaminophen 325 MG tablet  Commonly known as:  TYLENOL  Take 650 mg by mouth every 6 (six) hours as needed for mild pain, fever or headache (DO NOT EXCEED 4 GM OF APAP IN 24 HOURS FORM ALL SOURCES).     aspirin 81 MG chewable tablet  Chew 81 mg by mouth daily.     atorvastatin 20 MG tablet  Commonly known as:  LIPITOR  Take 20 mg by mouth at bedtime.     b-complex w/C Tabs tablet  Take 1 tablet by mouth at bedtime.     docusate sodium 100 MG capsule  Commonly known as:  COLACE  Take 100 mg by mouth daily.     feeding supplement (PRO-STAT SUGAR FREE 64) Liqd  Take 30 mLs by mouth daily.     gabapentin 100 MG capsule  Commonly known as:  NEURONTIN  Take 100 mg by mouth at  bedtime.     HYDROcodone-acetaminophen 10-325 MG tablet  Commonly known as:  NORCO  Take one tablet by mouth every 8 hours as needed for moderate pain. Do not exceed 4gm of Tylenol in 24 hours     midodrine 5 MG tablet  Commonly known as:  PROAMATINE  Take 5 mg by mouth 3 (three) times daily. Do not take 6 am dose on M- W- F- d/t  dialysis days.     sevelamer carbonate 800 MG tablet  Commonly known as:  RENVELA  Take 800 mg by mouth daily with lunch. 12 pm and 5 pm.     sorbitol 70 % solution  Take 30 mLs by mouth daily as needed (severe constipation).     ZOFRAN 4 MG tablet  Generic drug:  ondansetron  Take 4 mg by mouth every 6 (six) hours as needed for nausea.         The results of significant diagnostics from this hospitalization (including imaging, microbiology, ancillary and laboratory) are listed below for reference.    Significant  Diagnostic Studies: Ct Abdomen Pelvis Wo Contrast  11/07/2015  CLINICAL DATA:  Abdominal pain, elevated lipase. Right upper quadrant pain. EXAM: CT ABDOMEN AND PELVIS WITHOUT CONTRAST TECHNIQUE: Multidetector CT imaging of the abdomen and pelvis was performed following the standard protocol without IV contrast. COMPARISON:  Ultrasound earlier this day.  CT 05/06/2014 FINDINGS: Lower chest: Extensive coronary artery calcifications. Calcifications at the lung hila, partially included. Probable bilateral calcified hilar lymph nodes, suboptimally assessed. Atelectasis at the lung bases. Liver: No evidence of focal lesion allowing for lack contrast. Hepatobiliary: Distended gallbladder containing dependent gallstones. Previously assessed by ultrasound. Pancreas: Suboptimally assessed without contrast, however there is diffuse soft tissue fullness. Mild peripancreatic edema. Spleen: Enlarged measuring 15.4 cm craniocaudal. Adrenal glands: Fullness bilaterally.  No discrete nodule. Kidneys: Atrophic parenchyma. Multiple bilateral renal cysts, with a large  parapelvic cyst on the left. Some of the cysts appear complicated. Extensive renovascular calcifications. Stomach/Bowel: Stomach distended with ingested contents. Dilated fluid-filled proximal small bowel with transition in the mid right abdomen image 55 series 2. There is mesenteric edema and free fluid. No pneumatosis. More distal small bowel loops are decompressed. Enteric sutures noted at the base of the cecum. There is cecal and ascending colonic wall thickening. Small volume of stool throughout the remainder the colon. Vascular/Lymphatic: Dense atherosclerosis of the abdominal aorta. No aneurysm. No definite retroperitoneal adenopathy. Reproductive: Prominent sized prostate gland causing mass effect on the bladder base. Bladder: Decompressed. Other: Small volume of free fluid in the abdomen and pelvis. No evidence of free air. Postsurgical change in the anterior abdominal wall with small fat containing periumbilical ventral abdominal wall hernia. Musculoskeletal: There are no acute or suspicious osseous abnormalities. 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 Bony fusion of the sacroiliac joints. Degenerative change throughout spine. IMPRESSION: 1. Small-bowel obstruction with transition point in the right mid abdomen. 2. Soft tissue fullness about the entire pancreas. There is mild peripancreatic edema, however diffuse mesenteric edema is also seen. Underlying pancreatic mass cannot be excluded. If patient is able tolerate breath hold technique, MRI may be of value. 3. Splenomegaly. 4. Cholelithiasis, please reference same-day right upper quadrant ultrasound. 5. Advanced atherosclerosis. 6. Additional chronic findings as described. Electronically Signed   By: Jeb Levering M.D.   On: 11/07/2015 05:55   Dg Abd 2 Views  11/08/2015  CLINICAL DATA:  Small-bowel obstruction EXAM: ABDOMEN - 2 VIEW COMPARISON:  CT abdomen 11/07/2015 FINDINGS: Mildly dilated small bowel loops with air-fluid  levels. Gas in the transverse colon with air-fluid levels. Upright view does not include the right hemidiaphragm. This would need to be repeated if there is concern of free air. IMPRESSION: Air-fluid levels in large and small bowel may be related to ileus or resolving small bowel obstruction Right hemidiaphragm not included on the upright view. If there is concern of free air, repeat upright view could be performed. Electronically Signed   By: Franchot Gallo M.D.   On: 11/08/2015 15:01   US Abdomen Limited Ruq  11/07/2015  CLINICAL DATA:  Chronic periumbilical pain for 1 month. Initial encounter. EXAM: US ABDOMEN LIMITED - RIGHT UPPER QUADRANT COMPARISON:  CT of the abdomen and pelvis from 05/03/2014 FINDINGS: Gallbladder: Stones and sludge are noted dependently within the gallbladder. The gallbladder is otherwise unremarkable. No significant gallbladder wall thickening or pericholecystic fluid is seen. No ultrasonographic Murphy's sign is elicited. Common bile duct: Diameter: 0.6 cm, within normal limits in caliber. Liver: No focal lesion identified. Within normal limits in parenchymal echogenicity. IMPRESSION: 1.  No acute abnormality seen at the right upper quadrant. 2. Stones and sludge seen dependently within the gallbladder. Gallbladder otherwise unremarkable in appearance. Electronically Signed   By: Garald Balding M.D.   On: 11/07/2015 01:20    Microbiology: Recent Results (from the past 240 hour(s))  MRSA PCR Screening     Status: None   Collection Time: 11/07/15  8:23 AM  Result Value Ref Range Status   MRSA by PCR NEGATIVE NEGATIVE Final    Comment:        The GeneXpert MRSA Assay (FDA approved for NASAL specimens only), is one component of a comprehensive MRSA colonization surveillance program. It is not intended to diagnose MRSA infection nor to guide or monitor treatment for MRSA infections.      Labs: Basic Metabolic Panel:  Recent Labs Lab 11/06/15 2350 11/07/15 [id number]  11/08/15 0518 11/08/15 [id number] 11/09/15 0547 11/10/15 0656 11/10/15 0826  NA 134* 136 137 136 138 138 136  K 4.2 4.2 4.8 4.9 3.6 3.8 3.7  CL 97* 96* 99* 98* 97* 98* 98*  CO2 24 24 24 24 22 26 26   GLUCOSE 142* 171* 91 93 60* 89 88  BUN 23* 28* 36* 36* 19 28* 27*  CREATININE 5.37* 5.81* 6.94* 6.90* 4.57* 6.41* 6.38*  CALCIUM 8.4* 8.5* 7.8* 7.7* 7.7* 7.6* 7.5*  MG 2.2  --  2.1  --  2.0 2.1  --   PHOS 2.4*  --   --  3.2  --   --  3.2   Liver Function Tests:  Recent Labs Lab 11/06/15 2350 11/07/15 0523 11/08/15 0518 11/08/15 0718 11/09/15 0547 11/10/15 0656 11/10/15 0826  AST 20 20 16   --  18 19  --   ALT 11* 11* 11*  --  11* 11*  --   ALKPHOS 195* 191* 165*  --  141* 135*  --   BILITOT 1.2 1.5* 1.0  --  1.6* 1.2  --   PROT 8.3* 8.0 7.3  --  6.8 6.7  --   ALBUMIN 3.9 3.6 3.3* 3.2* 3.0* 3.0* 3.0*    Recent Labs Lab 11/06/15 2350 11/08/15 1137  LIPASE 105* 65*   No results for input(s): AMMONIA in the last 168 hours. CBC:  Recent Labs Lab 11/06/15 2350 11/07/15 0523 11/08/15 0518 11/09/15 0547 11/10/15 0656  WBC 4.4 4.4 4.6 4.1 2.8*  NEUTROABS 3.6  --  3.3 2.7 1.7  HGB 11.3* 11.1* 10.6* 9.9* 9.5*  HCT 35.4* 35.6* 33.6* 32.3* 30.8*  MCV 82.7 84.0 83.6 85.7 84.2  PLT 93* 103* 105* 86* 91*   Cardiac Enzymes: No results for input(s): CKTOTAL, CKMB, CKMBINDEX, TROPONINI in the last 168 hours. BNP: BNP (last 3 results) No results for input(s): BNP in the last 8760 hours.  ProBNP (last 3 results) No results for input(s): PROBNP in the last 8760 hours.  CBG:  Recent Labs Lab 11/07/15 0333 11/07/15 0759 11/07/15 1219 11/07/15 1648  GLUCAP 172* 141* 154* 126*       Signed:  GHERGHE, COSTIN  [facility] 11/10/2015, 10:50 AM

## 2015-11-10 NOTE — Progress Notes (Signed)
Notified pt's son Eufemio Stfleur that pt had arranged transportation ([facility]) on his own.

## 2015-11-10 NOTE — Clinical Social Work Note (Addendum)
Mr. Bauermeister will discharge back to [facility] today, transported by [facility] transportation (patient called transport). Discharge information transmitted to facility and patient has talked with one of his children (son Rendon Atondo) by phone regarding his discharge while CSW in room.  Haidan Nhan Givens, MSW, LCSW Licensed Clinical Social Worker [place] [phone]

## 2015-11-10 NOTE — Progress Notes (Signed)
PT Cancellation Note  Patient Details Name: Sean [name] MRN: [id number] DOB: 10-30-1935   Cancelled Treatment:    Reason Eval/Treat Not Completed: Patient at procedure or test/unavailable   Currently in HD;  Will follow up later today as time allows;  Otherwise, will follow up for PT tomorrow;   Thank you,  Roney Marion, [place] Pager [phone] Office [phone]     Roney Marion [facility] 11/10/2015, 8:38 AM

## 2015-11-10 NOTE — Progress Notes (Signed)
Patient ID: Sean [name], male   DOB: [birth year], 80 y.o.   MRN: [id number]     [address] [place], [place] [address]    Phone: [phone] FAX: [phone]     Subjective: No n/v. Tolerating solids. No pain. LFTs normalized. Having BMs, loose.   Objective:  Vital signs:  Filed Vitals:   11/10/15 0900 11/10/15 0930 11/10/15 1000 11/10/15 1030  BP: 153/72 129/64 142/71 122/68  Pulse: 71 71 75 78  Temp:      TempSrc:      Resp:      Height:      Weight:      SpO2:        Last BM Date: 11/08/15  Intake/Output   Yesterday:  05/30 0701 - 05/31 0700 In: 600 [P.O.:600] Out: -  This shift: I/O last 3 completed shifts: In: 600 [P.O.:600] Out: 0        Physical Exam: General: Pt awake/alert/oriented x4 in no acute distress  Abdomen: Soft.  Nondistended.  Non tender.  No evidence of peritonitis.  No incarcerated hernias.    Problem List:   Principal Problem:   Gallstone pancreatitis Active Problems:   End stage renal disease on dialysis (HCC)   S/P AKA (above knee amputation) unilateral (HCC)   Type 2 diabetes mellitus with chronic kidney disease on chronic dialysis, without long-term current use of insulin (HCC)   Abdominal pain, epigastric   Chronic systolic CHF (congestive heart failure) ([place])    Results:   Labs: Results for orders placed or performed during the hospital encounter of 11/06/15 (from the past 48 hour(s))  Lipase, blood     Status: Abnormal   Collection Time: 11/08/15 11:37 AM  Result Value Ref Range   Lipase 65 (H) 11 - 51 U/L  CBC with Differential/Platelet     Status: Abnormal   Collection Time: 11/09/15  5:47 AM  Result Value Ref Range   WBC 4.1 4.0 - 10.5 K/uL   RBC 3.77 (L) 4.22 - 5.81 MIL/uL   Hemoglobin 9.9 (L) 13.0 - 17.0 g/dL   HCT 32.3 (L) 39.0 - 52.0 %   MCV 85.7 78.0 - 100.0 fL   MCH 26.3 26.0 - 34.0 pg   MCHC 30.7 30.0 - 36.0 g/dL   RDW 16.0  (H) 11.5 - 15.5 %   Platelets 86 (L) 150 - 400 K/uL    Comment: CONSISTENT WITH PREVIOUS RESULT   Neutrophils Relative % 66 %   Neutro Abs 2.7 1.7 - 7.7 K/uL   Lymphocytes Relative 20 %   Lymphs Abs 0.8 0.7 - 4.0 K/uL   Monocytes Relative 12 %   Monocytes Absolute 0.5 0.1 - 1.0 K/uL   Eosinophils Relative 1 %   Eosinophils Absolute 0.1 0.0 - 0.7 K/uL   Basophils Relative 1 %   Basophils Absolute 0.0 0.0 - 0.1 K/uL  Comprehensive metabolic panel     Status: Abnormal   Collection Time: 11/09/15  5:47 AM  Result Value Ref Range   Sodium 138 135 - 145 mmol/L   Potassium 3.6 3.5 - 5.1 mmol/L   Chloride 97 (L) 101 - 111 mmol/L   CO2 22 22 - 32 mmol/L   Glucose, Bld 60 (L) 65 - 99 mg/dL   BUN 19 6 - 20 mg/dL   Creatinine, Ser 4.57 (H) 0.61 - 1.24 mg/dL    Comment: RESULT REPEATED AND VERIFIED  Calcium 7.7 (L) 8.9 - 10.3 mg/dL   Total Protein 6.8 6.5 - 8.1 g/dL   Albumin 3.0 (L) 3.5 - 5.0 g/dL   AST 18 15 - 41 U/L   ALT 11 (L) 17 - 63 U/L   Alkaline Phosphatase 141 (H) 38 - 126 U/L   Total Bilirubin 1.6 (H) 0.3 - 1.2 mg/dL   GFR calc non Af Amer 11 (L) >60 mL/min   GFR calc Af Amer 13 (L) >60 mL/min    Comment: (NOTE) The eGFR has been calculated using the CKD EPI equation. This calculation has not been validated in all clinical situations. eGFR's persistently <60 mL/min signify possible Chronic Kidney Disease.    Anion gap 19 (H) 5 - 15  Magnesium     Status: None   Collection Time: 11/09/15  5:47 AM  Result Value Ref Range   Magnesium 2.0 1.7 - 2.4 mg/dL  CBC with Differential/Platelet     Status: Abnormal   Collection Time: 11/10/15  6:56 AM  Result Value Ref Range   WBC 2.8 (L) 4.0 - 10.5 K/uL   RBC 3.66 (L) 4.22 - 5.81 MIL/uL   Hemoglobin 9.5 (L) 13.0 - 17.0 g/dL   HCT 30.8 (L) 39.0 - 52.0 %   MCV 84.2 78.0 - 100.0 fL   MCH 26.0 26.0 - 34.0 pg   MCHC 30.8 30.0 - 36.0 g/dL   RDW 15.8 (H) 11.5 - 15.5 %   Platelets 91 (L) 150 - 400 K/uL    Comment: CONSISTENT WITH  PREVIOUS RESULT   Neutrophils Relative % 62 %   Neutro Abs 1.7 1.7 - 7.7 K/uL   Lymphocytes Relative 21 %   Lymphs Abs 0.6 (L) 0.7 - 4.0 K/uL   Monocytes Relative 16 %   Monocytes Absolute 0.4 0.1 - 1.0 K/uL   Eosinophils Relative 1 %   Eosinophils Absolute 0.0 0.0 - 0.7 K/uL   Basophils Relative 0 %   Basophils Absolute 0.0 0.0 - 0.1 K/uL  Comprehensive metabolic panel     Status: Abnormal   Collection Time: 11/10/15  6:56 AM  Result Value Ref Range   Sodium 138 135 - 145 mmol/L   Potassium 3.8 3.5 - 5.1 mmol/L   Chloride 98 (L) 101 - 111 mmol/L   CO2 26 22 - 32 mmol/L   Glucose, Bld 89 65 - 99 mg/dL   BUN 28 (H) 6 - 20 mg/dL   Creatinine, Ser 6.41 (H) 0.61 - 1.24 mg/dL   Calcium 7.6 (L) 8.9 - 10.3 mg/dL   Total Protein 6.7 6.5 - 8.1 g/dL   Albumin 3.0 (L) 3.5 - 5.0 g/dL   AST 19 15 - 41 U/L   ALT 11 (L) 17 - 63 U/L   Alkaline Phosphatase 135 (H) 38 - 126 U/L   Total Bilirubin 1.2 0.3 - 1.2 mg/dL   GFR calc non Af Amer 7 (L) >60 mL/min   GFR calc Af Amer 9 (L) >60 mL/min    Comment: (NOTE) The eGFR has been calculated using the CKD EPI equation. This calculation has not been validated in all clinical situations. eGFR's persistently <60 mL/min signify possible Chronic Kidney Disease.    Anion gap 14 5 - 15  Magnesium     Status: None   Collection Time: 11/10/15  6:56 AM  Result Value Ref Range   Magnesium 2.1 1.7 - 2.4 mg/dL  Renal function panel     Status: Abnormal   Collection Time: 11/10/15  8:26  AM  Result Value Ref Range   Sodium 136 135 - 145 mmol/L   Potassium 3.7 3.5 - 5.1 mmol/L   Chloride 98 (L) 101 - 111 mmol/L   CO2 26 22 - 32 mmol/L   Glucose, Bld 88 65 - 99 mg/dL   BUN 27 (H) 6 - 20 mg/dL   Creatinine, Ser 6.38 (H) 0.61 - 1.24 mg/dL   Calcium 7.5 (L) 8.9 - 10.3 mg/dL   Phosphorus 3.2 2.5 - 4.6 mg/dL   Albumin 3.0 (L) 3.5 - 5.0 g/dL   GFR calc non Af Amer 7 (L) >60 mL/min   GFR calc Af Amer 9 (L) >60 mL/min    Comment: (NOTE) The eGFR has been  calculated using the CKD EPI equation. This calculation has not been validated in all clinical situations. eGFR's persistently <60 mL/min signify possible Chronic Kidney Disease.    Anion gap 12 5 - 15    Imaging / Studies: Dg Abd 2 Views  11/08/2015  CLINICAL DATA:  Small-bowel obstruction EXAM: ABDOMEN - 2 VIEW COMPARISON:  CT abdomen 11/07/2015 FINDINGS: Mildly dilated small bowel loops with air-fluid levels. Gas in the transverse colon with air-fluid levels. Upright view does not include the right hemidiaphragm. This would need to be repeated if there is concern of free air. IMPRESSION: Air-fluid levels in large and small bowel may be related to ileus or resolving small bowel obstruction Right hemidiaphragm not included on the upright view. If there is concern of free air, repeat upright view could be performed. Electronically Signed   By: Franchot Gallo M.D.   On: 11/08/2015 15:01    Medications / Allergies:  Scheduled Meds: . darbepoetin (ARANESP) injection - DIALYSIS  40 mcg Intravenous Q Wed-HD  . feeding supplement  1 Container Oral BID BM  . heparin subcutaneous  5,000 Units Subcutaneous Q8H  . pantoprazole (PROTONIX) IV  40 mg Intravenous Q24H   Continuous Infusions: . sodium chloride 10 mL/hr at 11/08/15 0756   PRN Meds:.sodium chloride, sodium chloride, acetaminophen, albuterol, alteplase, heparin, heparin, HYDROmorphone (DILAUDID) injection, lidocaine (PF), lidocaine-prilocaine, ondansetron **OR** ondansetron (ZOFRAN) IV, pentafluoroprop-tetrafluoroeth, traMADol  Antibiotics: Anti-infectives    Start     Dose/Rate Route Frequency Ordered Stop   11/07/15 2200  cefTRIAXone (ROCEPHIN) 2 g in dextrose 5 % 50 mL IVPB  Status:  Discontinued     2 g 100 mL/hr over 30 Minutes Intravenous Every 24 hours 11/07/15 0403 11/07/15 0816   11/07/15 0900  piperacillin-tazobactam (ZOSYN) IVPB 2.25 g  Status:  Discontinued     2.25 g 100 mL/hr over 30 Minutes Intravenous Every 8 hours  11/07/15 0830 11/08/15 1837   11/07/15 0200  cefTRIAXone (ROCEPHIN) 2 g in dextrose 5 % 50 mL IVPB     2 g 100 mL/hr over 30 Minutes Intravenous  Once 11/07/15 0145 11/07/15 0318        Assessment/Plan Gallstone pancreatitis Hx cholecystitis requiring cholecystostomy tube -resolved -stable for discharge from a surgical standpoint.  Follow up arranged with Dr. Roswell Miners, [facility] Surgery Pager [phone](7A-4:30P) For consults and floor pages call [phone](7A-4:30P)  11/10/2015 11:04 AM

## 2015-11-10 NOTE — NC FL2 (Signed)
[place] LEVEL OF CARE SCREENING TOOL     IDENTIFICATION  Patient Name: Sean [name] Birthdate: 1936/03/17 Sex: male Admission Date (Current Location): 11/06/2015  [facility] and [place] Number:  Herbalist and Address:  [facility] [place], [place] [address] [place], [place] [address]      Provider Number: [id number]  Attending Physician Name and Address:  Caren Griffins, MD  Relative Name and Phone Number:  [name], daughter. Phone [phone]    Current Level of Care: Hospital Recommended Level of Care: [place] (From [facility]) Prior Approval Number:    Date Approved/Denied:   PASRR Number:    Discharge Plan: SNF    Current Diagnoses: Patient Active Problem List   Diagnosis Date Noted  . Chronic systolic CHF (congestive heart failure) ([place])   . Gallstone pancreatitis 11/07/2015  . Abdominal pain, epigastric 11/07/2015  . PAD (peripheral artery disease) ([place]) 09/14/2015  . Phantom pain ([place]) 09/14/2015  . Hemodialysis-associated hypotension 09/14/2015  . Protein-calorie malnutrition ([place]) 09/14/2015  . Type 2 diabetes mellitus with chronic kidney disease on chronic dialysis, without long-term current use of insulin ([place]) 03/25/2015  . Chronic combined systolic and diastolic heart failure ([place]) 03/01/2015  . Neuropathic pain 01/12/2015  . Abnormal CT scan 05/29/2014  . Ischemic colitis ([place]) 05/08/2014  . Cholelithiasis   . Atherosclerotic peripheral vascular disease with ulceration ([place])   . Colitis, ischemic ([place]) 05/03/2014  . S/P AKA (above knee amputation) unilateral ([place]) 05/03/2014  . Dyslipidemia 04/07/2014  . 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 Depression 01/01/2014  . Secondary hyperparathyroidism ([place]) 12/20/2013  . CAD (coronary artery disease), native coronary artery 12/16/2013  . Diabetes mellitus due to underlying condition ([place])  09/19/2011  . ANXIETY 01/15/2009  . Acute myocardial infarction ([place]) 01/15/2009  . End stage renal disease on dialysis ([place]) 01/15/2009    Orientation RESPIRATION BLADDER Height & Weight     Self, Time, Situation, Place  Normal Continent Weight: 153 lb 10.6 oz (69.7 kg) Height:  5\' 5"  (165.1 cm)  BEHAVIORAL SYMPTOMS/MOOD NEUROLOGICAL BOWEL NUTRITION STATUS      Continent Diet (Renal with 1200 mLfluid restriction)  AMBULATORY STATUS COMMUNICATION OF NEEDS Skin   Limited Assist Verbally Normal                       Personal Care Assistance Level of Assistance  Bathing, Feeding, Dressing Bathing Assistance: Limited assistance Feeding assistance: Independent Dressing Assistance: Limited assistance     Functional Limitations Info  Sight, Hearing, Speech Sight Info: Impaired (Patient blind in right eye) Hearing Info: Adequate Speech Info: Adequate    SPECIAL CARE FACTORS FREQUENCY                       Contractures Contractures Info: Not present    Additional Factors Info  Code Status Code Status Info: Full code             Current Medications (11/10/2015):  This is the current hospital active medication list Current Facility-Administered Medications  Medication Dose Route Frequency Provider Last Rate Last Dose  . 0.9 %  sodium chloride infusion   Intravenous Continuous Alric Seton, PA-C 10 mL/hr at 11/08/15 0756    . acetaminophen (TYLENOL) suppository 650 mg  650 mg Rectal Q4H PRN Lavina Hamman, MD      . albuterol (PROVENTIL) (2.5 MG/3ML) 0.083% nebulizer solution 2.5 mg  2.5 mg Nebulization Q2H PRN Norval Morton, MD      . Darbepoetin Alfa (ARANESP) injection 40 mcg  40 mcg Intravenous Q Wed-HD Alric Seton, PA-C   40 mcg at 11/10/15 1107  . feeding supplement (BOOST / RESOURCE BREEZE) liquid 1 Container  1 Container Oral BID BM Alric Seton, PA-C   1 Container at 11/09/15 1444  . heparin injection 5,000 Units  5,000 Units Subcutaneous Q8H  Lavina Hamman, MD   5,000 Units at 11/09/15 2059  . HYDROmorphone (DILAUDID) injection 0.5 mg  0.5 mg Intravenous Q3H PRN Lavina Hamman, MD      . ondansetron [facility]) tablet 4 mg  4 mg Oral Q6H PRN Norval Morton, MD       Or  . ondansetron (ZOFRAN) injection 4 mg  4 mg Intravenous Q6H PRN Norval Morton, MD   4 mg at 11/07/15 2132  . pantoprazole (PROTONIX) injection 40 mg  40 mg Intravenous Q24H Lavina Hamman, MD   40 mg at 11/10/15 1304  . traMADol (ULTRAM) tablet 50 mg  50 mg Oral Q6H PRN Lavina Hamman, MD         Discharge Medications: Please see discharge summary for a list of discharge medications.  Relevant Imaging Results:  Relevant Lab Results:   Additional Information ss#[id number].  Dialysis patient MWF at [facility], [place], [place]

## 2015-11-10 NOTE — Clinical Social Work Note (Signed)
Clinical Social Work Assessment  Patient Details  Name: Sean [name] MRN: [id number] Date of Birth: Dec 18, 1935  Date of referral:  11/10/15               Reason for consult:  Facility Placement                Permission sought to share information with:  Family Supports, Chartered certified accountant granted to share information::  Yes, Verbal Permission Granted (Patient contacted his son Sean [name] by phone regarding d/c while CSW in room)  Name::        Agency::  [name]  Relationship::  Admissions staff at [facility]:  [phone]  Housing/Transportation Living arrangements for the past 2 months:  [place] ([place]) Source of Information:  Patient Patient Interpreter Needed:  None Criminal Activity/Legal Involvement Pertinent to Current Situation/Hospitalization:  No - Comment as needed Significant Relationships:  Adult Children Lives with:    Do you feel safe going back to the place where you live?  Yes Need for family participation in patient care:  No (Coment)  Care giving concerns:  Patient reported that he has been at facility for approx. 18 months and expressed no concerns.   Social Worker assessment / plan:  Patient from [facility] and ready for discharge on 5/31. CSW talked with patient and confirmed his intent to return to [facility]. Sean [name] informed CSW that he has 3 children from his first marriage and 3 stepchildren from his 2nd marriage. While at the bedside, patient received a call from his son, Sean [name] and he informed son that he is returning to facility today (5/31).   Employment status:  Retired Forensic scientist:  [employer], Manufacturing engineer) PT Recommendations:  Not assessed at this time Information / Referral to community resources:  Other (Comment Required) (None needed or requested as patient returning to SNF)  Patient/Family's Response to care:  No concerns  expressed by patient regarding his care during hospitalization.  Patient/Family's Understanding of and Emotional Response to Diagnosis, Current Treatment, and Prognosis:  Not discussed.  Emotional Assessment Appearance:  Appears younger than stated age Attitude/Demeanor/Rapport:  Other (Appropriate) Affect (typically observed):  Calm, Appropriate, Pleasant Orientation:  Oriented to Self, Oriented to Place, Oriented to  Time, Oriented to Situation Alcohol / Substance use:  Tobacco Use (Patient quit smoking 39 years ago and reported that he does not drink or use illicit drugs.) Psych involvement (Current and /or in the community):  No (Comment)  Discharge Needs  Concerns to be addressed:  Discharge Planning Concerns Readmission within the last 30 days:  No Current discharge risk:  None Barriers to Discharge:  No Barriers Identified   Sean Feil, LCSW 11/10/2015, 3:05 PM

## 2015-11-10 NOTE — Progress Notes (Signed)
[facility] Progress Note  Assessment: 1. Acute pancreatitis/ cholelithiasis - In patient with hx prior colectomy for isch bowel and prior SBO 2015. Feeling better, took liguids yest and eating solid food now. Needs cholecystectomy, but pt wants to wait.  2. ESRD - MWF hd 3. BP/volume - below dry wt and BP's up. Plan UF 2kg today, lower dry 4. Anemia - Hgb 10.6 > 9.9  continue ESA q 4 weeks - due for redose 5/31^ due to 40 5. 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 He is intolerant of sensipar. 6. Nutrition -Alb 3 advanced to CL - add resource- try to advance diet   7. Thrombocytopenia - outpt plts 110s 2017 - follow   8. Ischemic CM EF 40%/ hx PCI 2011/ L AKA/ hx CVA   9.   SBO vs ileus - appears to have resolved  Plan - HD today, UF 2kg, lower dry   [name] MD [facility] pager [phone]    cell [phone] 11/10/2015, 8:42 AM    Subjective:   Eating solid food on HD today, no abd pain  Objective Filed Vitals:   11/10/15 0800 11/10/15 0809 11/10/15 0812 11/10/15 0830  BP: 153/81 150/66 159/81 145/72  Pulse: 76 74 75 74  Temp: 98.1 F (36.7 C)     TempSrc: Oral     Resp: 16     Height:      Weight: 71.8 kg (158 lb 4.6 oz)     SpO2: 98%      Physical Exam General: NAD Heart: RRR Lungs: no rales Abdomen: soft + BS RUQ tenderness Extremities: left AKA no sig edema, LLE tr edema Dialysis Access: left AVGG  Dialysis Orders: [facility] 4 hr 180 400/800 EDW 73 2 K 2 Ca heparin 5000 profile 4 AVFF Mircera 50 q 4 wks - last 5/3) left upper AVGG  Recent labs: Hgb 10.8 22% sat - same as April ferritin 1141 April - no Fe was given P 2s Ca ok iPTH 1794 plts - l10s - had ben higher before Dec  Additional Objective Labs: Basic Metabolic Panel:  Recent Labs Lab 11/06/15 2350  11/08/15 0718 11/09/15 0547  11/10/15 0656  NA 134*  < > 136 138 138  K 4.2  < > 4.9 3.6 3.8  CL 97*  < > 98* 97* 98*  CO2 24  < > 24 22 26   GLUCOSE 142*  < > 93 60* 89  BUN 23*  < > 36* 19 28*  CREATININE 5.37*  < > 6.90* 4.57* 6.41*  CALCIUM 8.4*  < > 7.7* 7.7* 7.6*  PHOS 2.4*  --  3.2  --   --   < > = values in this interval not displayed. Liver Function Tests:  Recent Labs Lab 11/08/15 0518 11/08/15 0718 11/09/15 0547 11/10/15 0656  AST 16  --  18 19  ALT 11*  --  11* 11*  ALKPHOS 165*  --  141* 135*  BILITOT 1.0  --  1.6* 1.2  PROT 7.3  --  6.8 6.7  ALBUMIN 3.3* 3.2* 3.0* 3.0*    Recent Labs Lab 11/06/15 2350 11/08/15 1137  LIPASE 105* 65*   CBC:  Recent Labs Lab 11/06/15 2350 11/07/15 0523 11/08/15 0518 11/09/15 0547 11/10/15 0656  WBC 4.4 4.4 4.6 4.1 2.8*  NEUTROABS 3.6  --  3.3 2.7 1.7  HGB 11.3* 11.1* 10.6* 9.9* 9.5*  HCT 35.4* 35.6* 33.6* 32.3* 30.8*  MCV 82.7 84.0 83.6 85.7 84.2  PLT 93* 103* 105* 86* 91*  CBG:  Recent Labs Lab 11/07/15 0333 11/07/15 0759 11/07/15 1219 11/07/15 1648  GLUCAP 172* 141* 154* 126*   Studies/Results: Dg Abd 2 Views  27-Nov-2015  CLINICAL DATA:  Small-bowel obstruction EXAM: ABDOMEN - 2 VIEW COMPARISON:  CT abdomen 11/07/2015 FINDINGS: Mildly dilated small bowel loops with air-fluid levels. Gas in the transverse colon with air-fluid levels. Upright view does not include the right hemidiaphragm. This would need to be repeated if there is concern of free air. IMPRESSION: Air-fluid levels in large and small bowel may be related to ileus or resolving small bowel obstruction Right hemidiaphragm not included on the upright view. If there is concern of free air, repeat upright view could be performed. Electronically Signed   By: Franchot Gallo M.D.   On: 11-27-2015 15:01   Medications: . sodium chloride 10 mL/hr at 11/27/15 0756   . darbepoetin (ARANESP) injection - DIALYSIS  40 mcg Intravenous Q Wed-HD  . feeding supplement  1 Container Oral BID  BM  . heparin subcutaneous  5,000 Units Subcutaneous Q8H  . pantoprazole (PROTONIX) IV  40 mg Intravenous Q24H

## 2015-11-11 ENCOUNTER — Encounter: Payer: Self-pay | Admitting: Internal Medicine

## 2015-11-11 ENCOUNTER — Non-Acute Institutional Stay (SKILLED_NURSING_FACILITY): Payer: Medicare Other | Admitting: Internal Medicine

## 2015-11-11 ENCOUNTER — Other Ambulatory Visit: Payer: Self-pay

## 2015-11-11 DIAGNOSIS — R5381 Other malaise: Secondary | ICD-10-CM

## 2015-11-11 DIAGNOSIS — R030 Elevated blood-pressure reading, without diagnosis of hypertension: Secondary | ICD-10-CM | POA: Diagnosis not present

## 2015-11-11 DIAGNOSIS — K5669 Other intestinal obstruction: Secondary | ICD-10-CM | POA: Diagnosis not present

## 2015-11-11 DIAGNOSIS — R52 Pain, unspecified: Secondary | ICD-10-CM

## 2015-11-11 DIAGNOSIS — K851 Biliary acute pancreatitis without necrosis or infection: Secondary | ICD-10-CM | POA: Diagnosis not present

## 2015-11-11 DIAGNOSIS — I739 Peripheral vascular disease, unspecified: Secondary | ICD-10-CM | POA: Diagnosis not present

## 2015-11-11 DIAGNOSIS — G546 Phantom limb syndrome with pain: Secondary | ICD-10-CM

## 2015-11-11 DIAGNOSIS — E785 Hyperlipidemia, unspecified: Secondary | ICD-10-CM

## 2015-11-11 DIAGNOSIS — IMO0001 Reserved for inherently not codable concepts without codable children: Secondary | ICD-10-CM

## 2015-11-11 DIAGNOSIS — I953 Hypotension of hemodialysis: Secondary | ICD-10-CM

## 2015-11-11 DIAGNOSIS — N186 End stage renal disease: Secondary | ICD-10-CM | POA: Diagnosis not present

## 2015-11-11 DIAGNOSIS — M792 Neuralgia and neuritis, unspecified: Secondary | ICD-10-CM | POA: Diagnosis not present

## 2015-11-11 DIAGNOSIS — K56609 Unspecified intestinal obstruction, unspecified as to partial versus complete obstruction: Secondary | ICD-10-CM

## 2015-11-11 DIAGNOSIS — Z992 Dependence on renal dialysis: Secondary | ICD-10-CM | POA: Diagnosis not present

## 2015-11-11 MED ORDER — HYDROCODONE-ACETAMINOPHEN 10-325 MG PO TABS: ORAL_TABLET | ORAL | Status: DC

## 2015-11-11 NOTE — Telephone Encounter (Signed)
Rx faxed to [facility] @ [phone], phone number [phone]  

## 2015-11-11 NOTE — Progress Notes (Signed)
LOCATION: [name]  PCP: [name], MD   Code Status: Full Code  Goals of care: Advanced Directive information Advanced Directives 11/06/2015  Does patient have an advance directive? No  Would patient like information on creating an advanced directive? -       Extended Emergency Contact Information Primary Emergency Contact: [facility] Address: [address]          [place], [place] [address] [name] of [place] Phone: [phone] Relation: Daughter Secondary Emergency Contact: [facility] Address: [address]          [place], [place] [address] [name] of [place] Phone: [phone] Relation: Son   Allergies  Allergen Reactions  . Benazepril Hcl Hives, Itching and Rash    flareups on patient head    Chief Complaint  Patient presents with  . Readmit To SNF    readmission visit     HPI:  Patient is a 80 y.o. male seen today for long term care post hospital readmission from 11/06/15-11/10/15 with acute abdominal pain with gallstone pancreatitis and small bowel obstruction with ileus. GI and general surgery were consulted. He was kept NPO and diet was advanced slowly as tolerated. He declined cholecystectomy. He has PVD, ESRD on HD type 2 DM among others. He is seen in his room today.   Review of Systems:  Constitutional: Negative for fever, chills, diaphoresis. Energy level slowly coming back.  HENT: Negative for headache, congestion, nasal discharge, sore throat, difficulty swallowing.   Eyes: Negative for blurred vision, double vision and discharge. Wears glasses. Respiratory: Negative for cough, shortness of breath and wheezing.   Cardiovascular: Negative for chest pain, palpitations, leg swelling.  Gastrointestinal: Negative for heartburn, nausea, vomiting, abdominal pain, loss of appetite, melena, diarrhea and constipation. Last bowel movement was today. Positive for occasional abdominal wall soreness.    Genitourinary: he is anuric.  Musculoskeletal: Negative for back pain, fall in the facility.  Skin: Negative for itching, rash.  Neurological: Negative for dizziness. Psychiatric/Behavioral: Negative for depression   Past Medical History  Diagnosis Date  . Hyperparathyroidism   . Arthritis   . Hyperlipidemia   . Hypertension   . Stroke ([place])   . GERD (gastroesophageal reflux disease)   . Migraine   . Heart attack ([place]) 2011; 1990's  . Peripheral neuropathy ([place])   . Depression with anxiety   . Dialysis patient [facility])     M, W, F; [place] CAD (coronary artery disease)     a. s/p NSTEMI and failed RCA PCI in 2011. b. Nuc 12/2013: nonischemic, prior inferior MI.  Marland Kitchen LBBB (left bundle branch block)   . Calciphylaxis     a. Multiple admissions for nonhealing L ulcer/sepsis, s/p L AKA 02/02/2014.  Marland Kitchen Chronic anemia   . Dementia   . ESRD (end stage renal disease) on dialysis ([place])   . Renal insufficiency   . Hypotension     renovascular  . History of left bundle branch block   . Type II diabetes mellitus ([place])     not taking any medications.  . Cholelithiasis 01/2014    acute cholecystitis managed with perc drain due to high surgical risk.    Past Surgical History  Procedure Laterality Date  . Parathyroidectomy  2011  . Capd insertion  09/12/2011    Procedure: LAPAROSCOPIC INSERTION CONTINUOUS AMBULATORY PERITONEAL DIALYSIS  (CAPD) CATHETER;  Surgeon: Edward Jolly, MD;  Location: [place];  Service: General;  Laterality: N/A;  .  Sp av dialysis shunt access existing *l*  2003  . Dg av dialysis shunt intro ndl*r* or      "not working" (09/19/11)  . Capd removal  03/12/2012    Procedure: CONTINUOUS AMBULATORY PERITONEAL DIALYSIS  (CAPD) CATHETER REMOVAL;  Surgeon: Edward Jolly, MD;  Location: [facility];  Service: General;  Laterality: N/A;  . Amputation Left 02/02/2014    Procedure: AMPUTATION ABOVE KNEE-LEFT;  Surgeon: Elam Dutch, MD;  Location: [facility];   Service: Vascular;  Laterality: Left;  . I&d extremity Left 03/31/2014    Procedure: IRRIGATION AND DEBRIDEMENT EXTREMITY - LEFT ABOVE KNEE AMPUTATION;  Surgeon: Elam Dutch, MD;  Location: [facility] [place];  Service: Vascular;  Laterality: Left;  . Application of wound vac Left 03/31/2014    Procedure: APPLICATION OF WOUND VAC - LEFT AKA;  Surgeon: Elam Dutch, MD;  Location: [facility];  Service: Vascular;  Laterality: Left;  . Colon surgery  2011    colon resection   . Colonoscopy with propofol N/A 07/14/2014    Procedure: COLONOSCOPY WITH PROPOFOL;  Surgeon: Jerene Bears, MD;  Location: [facility];  Service: Gastroenterology;  Laterality: N/A;   Social History:   reports that he quit smoking about 39 years ago. His smoking use included Cigarettes. He has a 1.75 pack-year smoking history. He has never used smokeless tobacco. He reports that he does not drink alcohol or use illicit drugs.  Family History  Problem Relation Age of Onset  . Cancer Sister     breast  . Cancer Brother     lung  . Anesthesia problems Neg Hx   . Hypotension Neg Hx   . Malignant hyperthermia Neg Hx   . Pseudochol deficiency Neg Hx     Medications:   Medication List       This list is accurate as of: 11/11/15  5:02 PM.  Always use your most recent med list.               acetaminophen 325 MG tablet  Commonly known as:  TYLENOL  Take 650 mg by mouth every 6 (six) hours as needed for mild pain, fever or headache (DO NOT EXCEED 4 GM OF APAP IN 24 HOURS FORM ALL SOURCES).     aspirin 81 MG chewable tablet  Chew 81 mg by mouth daily.     atorvastatin 20 MG tablet  Commonly known as:  LIPITOR  Take 20 mg by mouth at bedtime.     b-complex w/C Tabs tablet  Take 1 tablet by mouth at bedtime.     docusate sodium 100 MG capsule  Commonly known as:  COLACE  Take 100 mg by mouth daily.     feeding supplement (PRO-STAT SUGAR FREE 64) Liqd  Take 30 mLs by mouth daily.     gabapentin 100 MG capsule    Commonly known as:  NEURONTIN  Take 100 mg by mouth at bedtime.     HYDROcodone-acetaminophen 10-325 MG tablet  Commonly known as:  NORCO  Take one tablet by mouth every 8 hours as needed for moderate pain. Do not exceed 4gm of Tylenol in 24 hours     midodrine 5 MG tablet  Commonly known as:  PROAMATINE  Take 5 mg by mouth 3 (three) times daily. Do not take 6 am dose on M- W- F- d/t  dialysis days.     sevelamer carbonate 800 MG tablet  Commonly known as:  RENVELA  Take 800 mg by  mouth daily with lunch. 12 pm and 5 pm.     sorbitol 70 % solution  Take 30 mLs by mouth daily as needed (severe constipation).     ZOFRAN 4 MG tablet  Generic drug:  ondansetron  Take 4 mg by mouth every 6 (six) hours as needed for nausea.        Immunizations: Immunization History  Administered Date(s) Administered  . Influenza-Unspecified 04/03/2014, 04/19/2015  . PPD Test 04/03/2014, 04/17/2014, 02/16/2015  . Pneumococcal Polysaccharide-23 02/08/2014  . Pneumococcal-Unspecified 04/03/2014     Physical Exam: Filed Vitals:   11/11/15 1236  BP: 158/85  Pulse: 91  Temp: 98 F (36.7 C)  TempSrc: Oral  Resp: 24  Height: 5\' 5"  (1.651 m)  Weight: 189 lb (85.73 kg)  SpO2: 100%   Body mass index is 31.45 kg/(m^2).  General- elderly obese male, in no acute distress Head- normocephalic, atraumatic Nose- no maxillary or frontal sinus tenderness, no nasal discharge Throat- moist mucus membrane, has dentures  Eyes- PERRLA, EOMI, no pallor, no icterus, no discharge, normal conjunctiva, normal sclera Neck- no cervical lymphadenopathy Cardiovascular- normal s1,s2, no murmur, trace right leg edema Respiratory- bilateral clear to auscultation, no wheeze, no rhonchi, no crackles, no use of accessory muscles Abdomen- bowel sounds present, soft, non tender Musculoskeletal- left AKA with prosthesis in place, able to move all 3 extremities Neurological- alert and oriented to person, place and  time Skin- warm and dry, left arm AV fistula with good thrill, has calciphylaxisPsychiatry- normal mood and affect    Labs reviewed: Basic Metabolic Panel:  Recent Labs  11/06/15 2350  11/08/15 0518 11/08/15 0718 11/09/15 0547 11/10/15 0656 11/10/15 0826  NA 134*  < > 137 136 138 138 136  K 4.2  < > 4.8 4.9 3.6 3.8 3.7  CL 97*  < > 99* 98* 97* 98* 98*  CO2 24  < > 24 24 22 26 26   GLUCOSE 142*  < > 91 93 60* 89 88  BUN 23*  < > 36* 36* 19 28* 27*  CREATININE 5.37*  < > 6.94* 6.90* 4.57* 6.41* 6.38*  CALCIUM 8.4*  < > 7.8* 7.7* 7.7* 7.6* 7.5*  MG 2.2  --  2.1  --  2.0 2.1  --   PHOS 2.4*  --   --  3.2  --   --  3.2  < > = values in this interval not displayed. Liver Function Tests:  Recent Labs  11/08/15 0518  11/09/15 0547 11/10/15 0656 11/10/15 0826  AST 16  --  18 19  --   ALT 11*  --  11* 11*  --   ALKPHOS 165*  --  141* 135*  --   BILITOT 1.0  --  1.6* 1.2  --   PROT 7.3  --  6.8 6.7  --   ALBUMIN 3.3*  < > 3.0* 3.0* 3.0*  < > = values in this interval not displayed.  Recent Labs  11/06/15 2350 11/08/15 1137  LIPASE 105* 65*   No results for input(s): AMMONIA in the last 8760 hours. CBC:  Recent Labs  11/08/15 0518 11/09/15 0547 11/10/15 0656  WBC 4.6 4.1 2.8*  NEUTROABS 3.3 2.7 1.7  HGB 10.6* 9.9* 9.5*  HCT 33.6* 32.3* 30.8*  MCV 83.6 85.7 84.2  PLT 105* 86* 91*   Cardiac Enzymes: No results for input(s): CKTOTAL, CKMB, CKMBINDEX, TROPONINI in the last 8760 hours. BNP: Invalid input(s): POCBNP CBG:  Recent Labs  11/07/15 0759 11/07/15  1219 11/07/15 1648  GLUCAP 141* 154* 126*    Radiological Exams: Ct Abdomen Pelvis Wo Contrast  11/07/2015  CLINICAL DATA:  Abdominal pain, elevated lipase. Right upper quadrant pain. EXAM: CT ABDOMEN AND PELVIS WITHOUT CONTRAST TECHNIQUE: Multidetector CT imaging of the abdomen and pelvis was performed following the standard protocol without IV contrast. COMPARISON:  Ultrasound earlier this day.  CT  05/06/2014 FINDINGS: Lower chest: Extensive coronary artery calcifications. Calcifications at the lung hila, partially included. Probable bilateral calcified hilar lymph nodes, suboptimally assessed. Atelectasis at the lung bases. Liver: No evidence of focal lesion allowing for lack contrast. Hepatobiliary: Distended gallbladder containing dependent gallstones. Previously assessed by ultrasound. Pancreas: Suboptimally assessed without contrast, however there is diffuse soft tissue fullness. Mild peripancreatic edema. Spleen: Enlarged measuring 15.4 cm craniocaudal. Adrenal glands: Fullness bilaterally.  No discrete nodule. Kidneys: Atrophic parenchyma. Multiple bilateral renal cysts, with a large parapelvic cyst on the left. Some of the cysts appear complicated. Extensive renovascular calcifications. Stomach/Bowel: Stomach distended with ingested contents. Dilated fluid-filled proximal small bowel with transition in the mid right abdomen image 55 series 2. There is mesenteric edema and free fluid. No pneumatosis. More distal small bowel loops are decompressed. Enteric sutures noted at the base of the cecum. There is cecal and ascending colonic wall thickening. Small volume of stool throughout the remainder the colon. Vascular/Lymphatic: Dense atherosclerosis of the abdominal aorta. No aneurysm. No definite retroperitoneal adenopathy. Reproductive: Prominent sized prostate gland causing mass effect on the bladder base. Bladder: Decompressed. Other: Small volume of free fluid in the abdomen and pelvis. No evidence of free air. Postsurgical change in the anterior abdominal wall with small fat containing periumbilical ventral abdominal wall hernia. Musculoskeletal: There are no acute or suspicious osseous abnormalities. 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 Bony fusion of the sacroiliac joints. Degenerative change throughout spine. IMPRESSION: 1. Small-bowel obstruction with transition point in  the right mid abdomen. 2. Soft tissue fullness about the entire pancreas. There is mild peripancreatic edema, however diffuse mesenteric edema is also seen. Underlying pancreatic mass cannot be excluded. If patient is able tolerate breath hold technique, MRI may be of value. 3. Splenomegaly. 4. Cholelithiasis, please reference same-day right upper quadrant ultrasound. 5. Advanced atherosclerosis. 6. Additional chronic findings as described. Electronically Signed   By: Jeb Levering M.D.   On: 11/07/2015 05:55   Dg Abd 2 Views  11/08/2015  CLINICAL DATA:  Small-bowel obstruction EXAM: ABDOMEN - 2 VIEW COMPARISON:  CT abdomen 11/07/2015 FINDINGS: Mildly dilated small bowel loops with air-fluid levels. Gas in the transverse colon with air-fluid levels. Upright view does not include the right hemidiaphragm. This would need to be repeated if there is concern of free air. IMPRESSION: Air-fluid levels in large and small bowel may be related to ileus or resolving small bowel obstruction Right hemidiaphragm not included on the upright view. If there is concern of free air, repeat upright view could be performed. Electronically Signed   By: Franchot Gallo M.D.   On: 11/08/2015 15:01   US Abdomen Limited Ruq  11/07/2015  CLINICAL DATA:  Chronic periumbilical pain for 1 month. Initial encounter. EXAM: US ABDOMEN LIMITED - RIGHT UPPER QUADRANT COMPARISON:  CT of the abdomen and pelvis from 05/03/2014 FINDINGS: Gallbladder: Stones and sludge are noted dependently within the gallbladder. The gallbladder is otherwise unremarkable. No significant gallbladder wall thickening or pericholecystic fluid is seen. No ultrasonographic Murphy's sign is elicited. Common bile duct: Diameter: 0.6 cm, within normal limits in caliber. Liver: No  focal lesion identified. Within normal limits in parenchymal echogenicity. IMPRESSION: 1. No acute abnormality seen at the right upper quadrant. 2. Stones and sludge seen dependently within the  gallbladder. Gallbladder otherwise unremarkable in appearance. Electronically Signed   By: Garald Balding M.D.   On: 11/07/2015 01:20    Assessment/Plan  Physical deconditioning Post hospital admission with SBO with ileus and gallstone pancreatitis. Will have patient work with PT/OT as tolerated to regain strength and restore function.  Fall precautions are in place.  Gallstone pancreatitis Has occasional abdominal wall soreness. Denies nausea or vomiting. He would like to see surgery team as outpatient to discuss on surgical option. Monitor for acute abdominal pain signs. Continue tylenol as needed for pain  SBO Tolerating po intake well, no nausea or vomiting, had bowel movement this am. Monitor bowel movement. Continue colace  ESRD  On HD 3 days a week. continue renvela  Phantom pain S/p left AKA. Controlled pain. Continue norco 10-325 mg q8h prn pain.  PAD S/p left AKA. Continue baby aspirin. Continue foot and skin care to right leg  Neuropathic pain Stable. Continue neurontin 100 mg qhs  Hypotension  Associated with HD. Continue midodrine 5 mg tid. Monitor bp per facility protocol  Hyperlipidemia Lipid Panel     Component Value Date/Time   CHOL 136 01/20/2015   TRIG 30* 01/20/2015   HDL 57 01/20/2015   CHOLHDL 4.7 09/20/2011 0822   VLDL 24 09/20/2011 0822   LDLCALC 73 01/20/2015   Will need lipid panel checked. With recent pancreatitis episode, d/c statin if LDL at goal  Elevated bp reading Currently on midodrine. Check bp bid x 1 week and if remains elevated, consider decreasing his midodrine   Labs/tests ordered: cbc, cmp, lipid panel and a1c 11/11/15  Family/ staff Communication: reviewed care plan with patient and nursing supervisor    [name], MD Internal Medicine [place], [place] [address] Cell Phone (Monday-Friday 8 am - 5 pm): [phone] On Call: [phone] and follow prompts  after 5 pm and on weekends Office Phone: [phone] Office Fax: [phone]

## 2015-11-12 ENCOUNTER — Non-Acute Institutional Stay (SKILLED_NURSING_FACILITY): Payer: Medicare Other | Admitting: Family

## 2015-11-12 DIAGNOSIS — Z992 Dependence on renal dialysis: Secondary | ICD-10-CM | POA: Diagnosis not present

## 2015-11-12 DIAGNOSIS — N186 End stage renal disease: Secondary | ICD-10-CM

## 2015-11-12 DIAGNOSIS — W19XXXA Unspecified fall, initial encounter: Secondary | ICD-10-CM | POA: Diagnosis not present

## 2015-11-12 NOTE — Progress Notes (Signed)
Patient ID: Sean [name], male   DOB: 08/02/1935, 80 y.o.   MRN: [id number]  Location:  [place]:  SNF (31) Provider: Lennex Pietila FNP-C   [name], MD  Patient Care Team: [name], MD as PCP - General (Internal Medicine)  Extended Emergency Contact Information Primary Emergency Contact: [facility] Address: [address]          [place], [place] [address] [name] of [place] Phone: [phone] Relation: Daughter Secondary Emergency Contact: [facility] Address: [address]          [place], [place] [address] [name] Mobile Phone: [phone] Relation: Son  Code Status:  Full Code  Goals of care: Advanced Directive information Advanced Directives 11/06/2015  Does patient have an advance directive? No  Would patient like information on creating an advanced directive? -     Chief Complaint  Patient presents with  . Acute Visit    HPI:  Pt is a 80 y.o. male seen today at [facility]  for an acute visit for follow up fall episode. He is seen in his room today per the request of facility Nurse. Facility Nurse reports patient had a fall episode outside of the facility dialysis. Patient denies hitting head, hip or anywhere on the body. He denies any acute pain or bruises. He has been self propelling his wheelchair on the facility hallway without any difficulty.    Past Medical History  Diagnosis Date  . Hyperparathyroidism   . Arthritis   . Hyperlipidemia   . Hypertension   . Stroke ([place])   . GERD (gastroesophageal reflux disease)   . Migraine   . Heart attack ([place]) 2011; 1990's  . Peripheral neuropathy ([place])   . Depression with anxiety   . Dialysis patient [facility])     M, W, F; [place] CAD (coronary artery disease)     a. s/p NSTEMI and failed RCA PCI in 2011. b. Nuc 12/2013: nonischemic, prior inferior MI.  Marland Kitchen LBBB (left bundle branch block)   .  Calciphylaxis     a. Multiple admissions for nonhealing L ulcer/sepsis, s/p L AKA 02/02/2014.  Marland Kitchen Chronic anemia   . Dementia   . ESRD (end stage renal disease) on dialysis ([place])   . Renal insufficiency   . Hypotension     renovascular  . History of left bundle branch block   . Type II diabetes mellitus ([place])     not taking any medications.  . Cholelithiasis 01/2014    acute cholecystitis managed with perc drain due to high surgical risk.    Past Surgical History  Procedure Laterality Date  . Parathyroidectomy  2011  . Capd insertion  09/12/2011    Procedure: LAPAROSCOPIC INSERTION CONTINUOUS AMBULATORY PERITONEAL DIALYSIS  (CAPD) CATHETER;  Surgeon: Edward Jolly, MD;  Location: [place];  Service: General;  Laterality: N/A;  . Sp av dialysis shunt access existing *l*  2003  . Dg av dialysis shunt intro ndl*r* or      "not working" (09/19/11)  . Capd removal  03/12/2012    Procedure: CONTINUOUS AMBULATORY PERITONEAL DIALYSIS  (CAPD) CATHETER REMOVAL;  Surgeon: Edward Jolly, MD;  Location: [place];  Service: General;  Laterality: N/A;  . Amputation Left 02/02/2014    Procedure: AMPUTATION ABOVE KNEE-LEFT;  Surgeon: Elam Dutch, MD;  Location: [place];  Service: Vascular;  Laterality: Left;  . I&d extremity Left  03/31/2014    Procedure: IRRIGATION AND DEBRIDEMENT EXTREMITY - LEFT ABOVE KNEE AMPUTATION;  Surgeon: Elam Dutch, MD;  Location: [place];  Service: Vascular;  Laterality: Left;  . Application of wound vac Left 03/31/2014    Procedure: APPLICATION OF WOUND VAC - LEFT AKA;  Surgeon: Elam Dutch, MD;  Location: [place];  Service: Vascular;  Laterality: Left;  . Colon surgery  2011    colon resection   . Colonoscopy with propofol N/A 07/14/2014    Procedure: COLONOSCOPY WITH PROPOFOL;  Surgeon: Jerene Bears, MD;  Location: [facility];  Service: Gastroenterology;  Laterality: N/A;    Allergies  Allergen Reactions  . Benazepril Hcl Hives, Itching and Rash    flareups  on patient head      Medication List       This list is accurate as of: 11/12/15  5:06 PM.  Always use your most recent med list.               acetaminophen 325 MG tablet  Commonly known as:  TYLENOL  Take 650 mg by mouth every 6 (six) hours as needed for mild pain, fever or headache (DO NOT EXCEED 4 GM OF APAP IN 24 HOURS FORM ALL SOURCES).     aspirin 81 MG chewable tablet  Chew 81 mg by mouth daily.     atorvastatin 20 MG tablet  Commonly known as:  LIPITOR  Take 20 mg by mouth at bedtime.     b-complex w/C Tabs tablet  Take 1 tablet by mouth at bedtime.     docusate sodium 100 MG capsule  Commonly known as:  COLACE  Take 100 mg by mouth daily.     feeding supplement (PRO-STAT SUGAR FREE 64) Liqd  Take 30 mLs by mouth daily.     gabapentin 100 MG capsule  Commonly known as:  NEURONTIN  Take 100 mg by mouth at bedtime.     HYDROcodone-acetaminophen 10-325 MG tablet  Commonly known as:  NORCO  Take one tablet by mouth every 8 hours as needed for moderate pain. Do not exceed 4gm of Tylenol in 24 hours     midodrine 5 MG tablet  Commonly known as:  PROAMATINE  Take 5 mg by mouth 3 (three) times daily. Do not take 6 am dose on M- W- F- d/t  dialysis days.     sevelamer carbonate 800 MG tablet  Commonly known as:  RENVELA  Take 800 mg by mouth daily with lunch. 12 pm and 5 pm.     sorbitol 70 % solution  Take 30 mLs by mouth daily as needed (severe constipation).     ZOFRAN 4 MG tablet  Generic drug:  ondansetron  Take 4 mg by mouth every 6 (six) hours as needed for nausea.        Review of Systems  Constitutional: Negative for fever, chills, activity change, appetite change and fatigue.  HENT: Negative for congestion, rhinorrhea, sinus pressure, sneezing and sore throat.   Eyes: Negative for discharge and itching.  Respiratory: Negative for cough, chest tightness, shortness of breath and wheezing.   Cardiovascular: Negative for chest pain and  palpitations.  Gastrointestinal: Negative for nausea, vomiting, abdominal pain, diarrhea, constipation and abdominal distention.  Endocrine: Negative.   Genitourinary:       Hemodialysis On Mon, Wed and Friday   Musculoskeletal: Positive for gait problem.  Skin: Negative for color change, pallor and rash.  Neurological: Negative for dizziness, seizures, speech  difficulty, light-headedness and headaches.       Numbness and tingling on both hands and feet.   Hematological: Does not bruise/bleed easily.  Psychiatric/Behavioral: Negative for hallucinations, confusion, sleep disturbance and agitation. The patient is not nervous/anxious.     Immunization History  Administered Date(s) Administered  . Influenza-Unspecified 04/03/2014, 04/19/2015  . PPD Test 04/03/2014, 04/17/2014, 02/16/2015  . Pneumococcal Polysaccharide-23 02/08/2014  . Pneumococcal-Unspecified 04/03/2014   Pertinent  Health Maintenance Due  Topic Date Due  . OPHTHALMOLOGY EXAM  06/12/2016 (Originally 09/06/1945)  . URINE MICROALBUMIN  06/12/2016 (Originally 09/06/1945)  . PNA vac Low Risk Adult (2 of 2 - PCV13) 08/09/2016 (Originally 04/04/2015)  . HEMOGLOBIN A1C  01/04/2016  . INFLUENZA VACCINE  01/11/2016  . FOOT EXAM  08/18/2016   No flowsheet data found. Functional Status Survey:    Filed Vitals:   11/12/15 1653  BP: 118/69  Pulse: 68  Temp: 98.2 F (36.8 C)  Resp: 18  Height: 5\' 5"  (1.651 m)  Weight: 189 lb (85.73 kg)  SpO2: 98%   Body mass index is 31.45 kg/(m^2). Physical Exam  Constitutional: He is oriented to person, place, and time. He appears well-developed and well-nourished. No distress.  HENT:  Head: Normocephalic.  Mouth/Throat: Oropharynx is clear and moist.  Eyes: Conjunctivae are normal. Right eye exhibits no discharge. Left eye exhibits no discharge. No scleral icterus.  Neck: Normal range of motion. No JVD present.  Cardiovascular: Normal rate, regular rhythm, normal heart sounds and  intact distal pulses.  Exam reveals no gallop and no friction rub.   No murmur heard. Pulmonary/Chest: Effort normal and breath sounds normal. No respiratory distress. He has no wheezes. He has no rales.  Abdominal: Soft. Bowel sounds are normal. He exhibits no distension and no mass. There is no tenderness. There is no rebound and no guarding.  Musculoskeletal: Normal range of motion. He exhibits no tenderness.  Bilateral Lower extremities +1 edema. Left BKA prosthesis in place.   Lymphadenopathy:    He has no cervical adenopathy.  Neurological: He is oriented to person, place, and time.  Skin: Skin is warm and dry. No rash noted. No erythema. No pallor.  Left forearm AV fistula dialysis access positive for Bruit and thrill no sighs of redness or drainage.   Psychiatric: He has a normal mood and affect.    Labs reviewed:  Recent Labs  11/06/15 2350  11/08/15 0518 11/08/15 0718 11/09/15 0547 11/10/15 0656 11/10/15 0826  NA 134*  < > 137 136 138 138 136  K 4.2  < > 4.8 4.9 3.6 3.8 3.7  CL 97*  < > 99* 98* 97* 98* 98*  CO2 24  < > 24 24 22 26 26   GLUCOSE 142*  < > 91 93 60* 89 88  BUN 23*  < > 36* 36* 19 28* 27*  CREATININE 5.37*  < > 6.94* 6.90* 4.57* 6.41* 6.38*  CALCIUM 8.4*  < > 7.8* 7.7* 7.7* 7.6* 7.5*  MG 2.2  --  2.1  --  2.0 2.1  --   PHOS 2.4*  --   --  3.2  --   --  3.2  < > = values in this interval not displayed.  Recent Labs  11/08/15 0518  11/09/15 0547 11/10/15 0656 11/10/15 0826  AST 16  --  18 19  --   ALT 11*  --  11* 11*  --   ALKPHOS 165*  --  141* 135*  --   BILITOT  1.0  --  1.6* 1.2  --   PROT 7.3  --  6.8 6.7  --   ALBUMIN 3.3*  < > 3.0* 3.0* 3.0*  < > = values in this interval not displayed.  Recent Labs  11/08/15 0518 11/09/15 0547 11/10/15 0656  WBC 4.6 4.1 2.8*  NEUTROABS 3.3 2.7 1.7  HGB 10.6* 9.9* 9.5*  HCT 33.6* 32.3* 30.8*  MCV 83.6 85.7 84.2  PLT 105* 86* 91*   Lab Results  Component Value Date   TSH 1.050 12/21/2013    Lab Results  Component Value Date   HGBA1C 6.6 07/07/2015   Lab Results  Component Value Date   CHOL 136 01/20/2015   HDL 57 01/20/2015   LDLCALC 73 01/20/2015   TRIG 30* 01/20/2015   CHOLHDL 4.7 09/20/2011    Significant Diagnostic Results in last 30 days:  Ct Abdomen Pelvis Wo Contrast  11/07/2015  CLINICAL DATA:  Abdominal pain, elevated lipase. Right upper quadrant pain. EXAM: CT ABDOMEN AND PELVIS WITHOUT CONTRAST TECHNIQUE: Multidetector CT imaging of the abdomen and pelvis was performed following the standard protocol without IV contrast. COMPARISON:  Ultrasound earlier this day.  CT 05/06/2014 FINDINGS: Lower chest: Extensive coronary artery calcifications. Calcifications at the lung hila, partially included. Probable bilateral calcified hilar lymph nodes, suboptimally assessed. Atelectasis at the lung bases. Liver: No evidence of focal lesion allowing for lack contrast. Hepatobiliary: Distended gallbladder containing dependent gallstones. Previously assessed by ultrasound. Pancreas: Suboptimally assessed without contrast, however there is diffuse soft tissue fullness. Mild peripancreatic edema. Spleen: Enlarged measuring 15.4 cm craniocaudal. Adrenal glands: Fullness bilaterally.  No discrete nodule. Kidneys: Atrophic parenchyma. Multiple bilateral renal cysts, with a large parapelvic cyst on the left. Some of the cysts appear complicated. Extensive renovascular calcifications. Stomach/Bowel: Stomach distended with ingested contents. Dilated fluid-filled proximal small bowel with transition in the mid right abdomen image 55 series 2. There is mesenteric edema and free fluid. No pneumatosis. More distal small bowel loops are decompressed. Enteric sutures noted at the base of the cecum. There is cecal and ascending colonic wall thickening. Small volume of stool throughout the remainder the colon. Vascular/Lymphatic: Dense atherosclerosis of the abdominal aorta. No aneurysm. No definite  retroperitoneal adenopathy. Reproductive: Prominent sized prostate gland causing mass effect on the bladder base. Bladder: Decompressed. Other: Small volume of free fluid in the abdomen and pelvis. No evidence of free air. Postsurgical change in the anterior abdominal wall with small fat containing periumbilical ventral abdominal wall hernia. Musculoskeletal: There are no acute or suspicious osseous abnormalities. 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 Bony fusion of the sacroiliac joints. Degenerative change throughout spine. IMPRESSION: 1. Small-bowel obstruction with transition point in the right mid abdomen. 2. Soft tissue fullness about the entire pancreas. There is mild peripancreatic edema, however diffuse mesenteric edema is also seen. Underlying pancreatic mass cannot be excluded. If patient is able tolerate breath hold technique, MRI may be of value. 3. Splenomegaly. 4. Cholelithiasis, please reference same-day right upper quadrant ultrasound. 5. Advanced atherosclerosis. 6. Additional chronic findings as described. Electronically Signed   By: Jeb Levering M.D.   On: 11/07/2015 05:55   Dg Abd 2 Views  11/08/2015  CLINICAL DATA:  Small-bowel obstruction EXAM: ABDOMEN - 2 VIEW COMPARISON:  CT abdomen 11/07/2015 FINDINGS: Mildly dilated small bowel loops with air-fluid levels. Gas in the transverse colon with air-fluid levels. Upright view does not include the right hemidiaphragm. This would need to be repeated if there is concern of free air.  IMPRESSION: Air-fluid levels in large and small bowel may be related to ileus or resolving small bowel obstruction Right hemidiaphragm not included on the upright view. If there is concern of free air, repeat upright view could be performed. Electronically Signed   By: Franchot Gallo M.D.   On: 11/08/2015 15:01   US Abdomen Limited Ruq  11/07/2015  CLINICAL DATA:  Chronic periumbilical pain for 1 month. Initial encounter. EXAM: US ABDOMEN  LIMITED - RIGHT UPPER QUADRANT COMPARISON:  CT of the abdomen and pelvis from 05/03/2014 FINDINGS: Gallbladder: Stones and sludge are noted dependently within the gallbladder. The gallbladder is otherwise unremarkable. No significant gallbladder wall thickening or pericholecystic fluid is seen. No ultrasonographic Murphy's sign is elicited. Common bile duct: Diameter: 0.6 cm, within normal limits in caliber. Liver: No focal lesion identified. Within normal limits in parenchymal echogenicity. IMPRESSION: 1. No acute abnormality seen at the right upper quadrant. 2. Stones and sludge seen dependently within the gallbladder. Gallbladder otherwise unremarkable in appearance. Electronically Signed   By: Garald Balding M.D.   On: 11/07/2015 01:20    Assessment/Plan  Fall, initial encounter Head to toe assessment completed exam findings negative for bruises or pain. Complete ROM except for chronic limitation on fingers due to neuropathy. Fall and safety precautions.  monitor B/p Bid per previous orders.      Family/ staff Communication: Reviewed plan with patient and facility Nurse supervisor.   Labs/tests ordered: None

## 2015-11-16 LAB — HEPATIC FUNCTION PANEL
AST: 17 U/L (ref 14–40)
Alkaline Phosphatase: 180 U/L — AB (ref 25–125)
Bilirubin, Total: 0.8 mg/dL

## 2015-11-16 LAB — BASIC METABOLIC PANEL
BUN: 17 mg/dL (ref 4–21)
CREATININE: 5.4 mg/dL — AB (ref 0.6–1.3)
GLUCOSE: 241 mg/dL
POTASSIUM: 4 mmol/L (ref 3.4–5.3)
SODIUM: 140 mmol/L (ref 137–147)

## 2015-11-22 ENCOUNTER — Emergency Department (HOSPITAL_COMMUNITY): Payer: Medicare Other

## 2015-11-22 ENCOUNTER — Inpatient Hospital Stay (HOSPITAL_COMMUNITY)
Admission: EM | Admit: 2015-11-22 | Discharge: 2015-11-30 | DRG: 871 | Disposition: A | Discharge status: Skilled Nursing Facility | Payer: Medicare Other | Attending: Internal Medicine | Admitting: Internal Medicine

## 2015-11-22 ENCOUNTER — Encounter (HOSPITAL_COMMUNITY): Payer: Self-pay | Admitting: Emergency Medicine

## 2015-11-22 DIAGNOSIS — C259 Malignant neoplasm of pancreas, unspecified: Secondary | ICD-10-CM

## 2015-11-22 DIAGNOSIS — D638 Anemia in other chronic diseases classified elsewhere: Secondary | ICD-10-CM | POA: Diagnosis present

## 2015-11-22 DIAGNOSIS — F411 Generalized anxiety disorder: Secondary | ICD-10-CM | POA: Diagnosis present

## 2015-11-22 DIAGNOSIS — R197 Diarrhea, unspecified: Secondary | ICD-10-CM

## 2015-11-22 DIAGNOSIS — Z89612 Acquired absence of left leg above knee: Secondary | ICD-10-CM

## 2015-11-22 DIAGNOSIS — I959 Hypotension, unspecified: Secondary | ICD-10-CM | POA: Diagnosis present

## 2015-11-22 DIAGNOSIS — Z515 Encounter for palliative care: Secondary | ICD-10-CM | POA: Insufficient documentation

## 2015-11-22 DIAGNOSIS — Z8673 Personal history of transient ischemic attack (TIA), and cerebral infarction without residual deficits: Secondary | ICD-10-CM

## 2015-11-22 DIAGNOSIS — K559 Vascular disorder of intestine, unspecified: Secondary | ICD-10-CM | POA: Diagnosis present

## 2015-11-22 DIAGNOSIS — I2510 Atherosclerotic heart disease of native coronary artery without angina pectoris: Secondary | ICD-10-CM | POA: Diagnosis present

## 2015-11-22 DIAGNOSIS — A414 Sepsis due to anaerobes: Secondary | ICD-10-CM | POA: Diagnosis not present

## 2015-11-22 DIAGNOSIS — E1122 Type 2 diabetes mellitus with diabetic chronic kidney disease: Secondary | ICD-10-CM

## 2015-11-22 DIAGNOSIS — E876 Hypokalemia: Secondary | ICD-10-CM | POA: Diagnosis present

## 2015-11-22 DIAGNOSIS — A047 Enterocolitis due to Clostridium difficile: Secondary | ICD-10-CM | POA: Diagnosis present

## 2015-11-22 DIAGNOSIS — R161 Splenomegaly, not elsewhere classified: Secondary | ICD-10-CM | POA: Diagnosis present

## 2015-11-22 DIAGNOSIS — R1030 Lower abdominal pain, unspecified: Secondary | ICD-10-CM | POA: Diagnosis not present

## 2015-11-22 DIAGNOSIS — A0472 Enterocolitis due to Clostridium difficile, not specified as recurrent: Secondary | ICD-10-CM

## 2015-11-22 DIAGNOSIS — Z803 Family history of malignant neoplasm of breast: Secondary | ICD-10-CM

## 2015-11-22 DIAGNOSIS — K209 Esophagitis, unspecified without bleeding: Secondary | ICD-10-CM | POA: Insufficient documentation

## 2015-11-22 DIAGNOSIS — F0390 Unspecified dementia without behavioral disturbance: Secondary | ICD-10-CM | POA: Diagnosis present

## 2015-11-22 DIAGNOSIS — J9811 Atelectasis: Secondary | ICD-10-CM | POA: Diagnosis present

## 2015-11-22 DIAGNOSIS — E1151 Type 2 diabetes mellitus with diabetic peripheral angiopathy without gangrene: Secondary | ICD-10-CM | POA: Diagnosis present

## 2015-11-22 DIAGNOSIS — Z8719 Personal history of other diseases of the digestive system: Secondary | ICD-10-CM

## 2015-11-22 DIAGNOSIS — K8689 Other specified diseases of pancreas: Secondary | ICD-10-CM

## 2015-11-22 DIAGNOSIS — E860 Dehydration: Secondary | ICD-10-CM | POA: Diagnosis present

## 2015-11-22 DIAGNOSIS — I739 Peripheral vascular disease, unspecified: Secondary | ICD-10-CM | POA: Diagnosis present

## 2015-11-22 DIAGNOSIS — Z992 Dependence on renal dialysis: Secondary | ICD-10-CM

## 2015-11-22 DIAGNOSIS — E1121 Type 2 diabetes mellitus with diabetic nephropathy: Secondary | ICD-10-CM | POA: Diagnosis present

## 2015-11-22 DIAGNOSIS — A419 Sepsis, unspecified organism: Secondary | ICD-10-CM | POA: Diagnosis present

## 2015-11-22 DIAGNOSIS — Z9049 Acquired absence of other specified parts of digestive tract: Secondary | ICD-10-CM

## 2015-11-22 DIAGNOSIS — Z888 Allergy status to other drugs, medicaments and biological substances status: Secondary | ICD-10-CM

## 2015-11-22 DIAGNOSIS — R509 Fever, unspecified: Secondary | ICD-10-CM

## 2015-11-22 DIAGNOSIS — F329 Major depressive disorder, single episode, unspecified: Secondary | ICD-10-CM | POA: Diagnosis present

## 2015-11-22 DIAGNOSIS — A4189 Other specified sepsis: Secondary | ICD-10-CM

## 2015-11-22 DIAGNOSIS — N186 End stage renal disease: Secondary | ICD-10-CM

## 2015-11-22 DIAGNOSIS — K922 Gastrointestinal hemorrhage, unspecified: Secondary | ICD-10-CM | POA: Diagnosis not present

## 2015-11-22 DIAGNOSIS — Z7189 Other specified counseling: Secondary | ICD-10-CM | POA: Insufficient documentation

## 2015-11-22 DIAGNOSIS — Z8711 Personal history of peptic ulcer disease: Secondary | ICD-10-CM

## 2015-11-22 DIAGNOSIS — H5441 Blindness, right eye, normal vision left eye: Secondary | ICD-10-CM | POA: Diagnosis present

## 2015-11-22 DIAGNOSIS — K8020 Calculus of gallbladder without cholecystitis without obstruction: Secondary | ICD-10-CM | POA: Diagnosis present

## 2015-11-22 DIAGNOSIS — Z7982 Long term (current) use of aspirin: Secondary | ICD-10-CM

## 2015-11-22 DIAGNOSIS — K264 Chronic or unspecified duodenal ulcer with hemorrhage: Secondary | ICD-10-CM | POA: Diagnosis present

## 2015-11-22 DIAGNOSIS — E892 Postprocedural hypoparathyroidism: Secondary | ICD-10-CM | POA: Diagnosis present

## 2015-11-22 DIAGNOSIS — I5022 Chronic systolic (congestive) heart failure: Secondary | ICD-10-CM | POA: Diagnosis not present

## 2015-11-22 DIAGNOSIS — K210 Gastro-esophageal reflux disease with esophagitis: Secondary | ICD-10-CM | POA: Diagnosis present

## 2015-11-22 DIAGNOSIS — I5042 Chronic combined systolic (congestive) and diastolic (congestive) heart failure: Secondary | ICD-10-CM | POA: Diagnosis present

## 2015-11-22 DIAGNOSIS — K269 Duodenal ulcer, unspecified as acute or chronic, without hemorrhage or perforation: Secondary | ICD-10-CM | POA: Insufficient documentation

## 2015-11-22 DIAGNOSIS — K921 Melena: Secondary | ICD-10-CM | POA: Insufficient documentation

## 2015-11-22 DIAGNOSIS — R52 Pain, unspecified: Secondary | ICD-10-CM

## 2015-11-22 DIAGNOSIS — Z801 Family history of malignant neoplasm of trachea, bronchus and lung: Secondary | ICD-10-CM

## 2015-11-22 DIAGNOSIS — E785 Hyperlipidemia, unspecified: Secondary | ICD-10-CM | POA: Diagnosis present

## 2015-11-22 DIAGNOSIS — Z87891 Personal history of nicotine dependence: Secondary | ICD-10-CM

## 2015-11-22 DIAGNOSIS — I252 Old myocardial infarction: Secondary | ICD-10-CM

## 2015-11-22 DIAGNOSIS — D62 Acute posthemorrhagic anemia: Secondary | ICD-10-CM | POA: Diagnosis not present

## 2015-11-22 DIAGNOSIS — N2581 Secondary hyperparathyroidism of renal origin: Secondary | ICD-10-CM | POA: Diagnosis present

## 2015-11-22 DIAGNOSIS — F32A Depression, unspecified: Secondary | ICD-10-CM | POA: Diagnosis present

## 2015-11-22 DIAGNOSIS — I132 Hypertensive heart and chronic kidney disease with heart failure and with stage 5 chronic kidney disease, or end stage renal disease: Secondary | ICD-10-CM | POA: Diagnosis present

## 2015-11-22 DIAGNOSIS — D509 Iron deficiency anemia, unspecified: Secondary | ICD-10-CM | POA: Diagnosis present

## 2015-11-22 DIAGNOSIS — Z89619 Acquired absence of unspecified leg above knee: Secondary | ICD-10-CM

## 2015-11-22 DIAGNOSIS — K920 Hematemesis: Secondary | ICD-10-CM | POA: Insufficient documentation

## 2015-11-22 DIAGNOSIS — E872 Acidosis: Secondary | ICD-10-CM | POA: Diagnosis present

## 2015-11-22 LAB — COMPREHENSIVE METABOLIC PANEL
ALBUMIN: 3.1 g/dL — AB (ref 3.5–5.0)
ALT: 10 U/L — ABNORMAL LOW (ref 17–63)
AST: 19 U/L (ref 15–41)
Alkaline Phosphatase: 144 U/L — ABNORMAL HIGH (ref 38–126)
Anion gap: 14 (ref 5–15)
BUN: 35 mg/dL — AB (ref 6–20)
CHLORIDE: 99 mmol/L — AB (ref 101–111)
CO2: 23 mmol/L (ref 22–32)
CREATININE: 8.69 mg/dL — AB (ref 0.61–1.24)
Calcium: 8 mg/dL — ABNORMAL LOW (ref 8.9–10.3)
GFR calc Af Amer: 6 mL/min — ABNORMAL LOW (ref >60)
GFR calc non Af Amer: 5 mL/min — ABNORMAL LOW (ref >60)
Glucose, Bld: 107 mg/dL — ABNORMAL HIGH (ref 65–99)
POTASSIUM: 3.5 mmol/L (ref 3.5–5.1)
SODIUM: 136 mmol/L (ref 135–145)
Total Bilirubin: 1.7 mg/dL — ABNORMAL HIGH (ref 0.3–1.2)
Total Protein: 7.2 g/dL (ref 6.5–8.1)

## 2015-11-22 LAB — CBC WITH DIFFERENTIAL/PLATELET
Basophils Absolute: 0 10*3/uL (ref 0.0–0.1)
Basophils Relative: 0 %
EOS ABS: 0 10*3/uL (ref 0.0–0.7)
Eosinophils Relative: 0 %
HCT: 34.9 % — ABNORMAL LOW (ref 39.0–52.0)
HEMOGLOBIN: 10.9 g/dL — AB (ref 13.0–17.0)
LYMPHS ABS: 0.6 10*3/uL — AB (ref 0.7–4.0)
LYMPHS PCT: 6 %
MCH: 26.1 pg (ref 26.0–34.0)
MCHC: 31.2 g/dL (ref 30.0–36.0)
MCV: 83.5 fL (ref 78.0–100.0)
MONOS PCT: 15 %
Monocytes Absolute: 1.4 10*3/uL — ABNORMAL HIGH (ref 0.1–1.0)
NEUTROS PCT: 79 %
Neutro Abs: 7.5 10*3/uL (ref 1.7–7.7)
Platelets: 119 10*3/uL — ABNORMAL LOW (ref 150–400)
RBC: 4.18 MIL/uL — AB (ref 4.22–5.81)
RDW: 16.5 % — ABNORMAL HIGH (ref 11.5–15.5)
WBC: 9.5 10*3/uL (ref 4.0–10.5)

## 2015-11-22 LAB — LIPASE, BLOOD: LIPASE: 34 U/L (ref 11–51)

## 2015-11-22 LAB — TYPE AND SCREEN
ABO/RH(D): B POS
Antibody Screen: NEGATIVE

## 2015-11-22 LAB — I-STAT CG4 LACTIC ACID, ED: Lactic Acid, Venous: 1.91 mmol/L (ref 0.5–2.0)

## 2015-11-22 MED ORDER — B COMPLEX-C PO TABS
1.0000 | ORAL_TABLET | Freq: Every day | ORAL | Status: DC
Start: 2015-11-22 — End: 2015-11-30
  Administered 2015-11-23 – 2015-11-29 (×8): 1 via ORAL
  Filled 2015-11-22 (×10): qty 1

## 2015-11-22 MED ORDER — SEVELAMER CARBONATE 800 MG PO TABS
800.0000 mg | ORAL_TABLET | Freq: Two times a day (BID) | ORAL | Status: DC
  Administered 2015-11-23 (×2): 800 mg via ORAL
  Filled 2015-11-22 (×2): qty 1

## 2015-11-22 MED ORDER — MIDODRINE HCL 5 MG PO TABS
5.0000 mg | ORAL_TABLET | Freq: Three times a day (TID) | ORAL | Status: DC
Start: 2015-11-22 — End: 2015-11-22

## 2015-11-22 MED ORDER — HEPARIN SODIUM (PORCINE) 5000 UNIT/ML IJ SOLN
5000.0000 [IU] | Freq: Three times a day (TID) | INTRAMUSCULAR | Status: DC
  Administered 2015-11-23 – 2015-11-26 (×8): 5000 [IU] via SUBCUTANEOUS
  Filled 2015-11-22 (×6): qty 1

## 2015-11-22 MED ORDER — SODIUM CHLORIDE 0.9 % IV SOLN
INTRAVENOUS | Status: AC
  Administered 2015-11-23: 01:00:00 via INTRAVENOUS

## 2015-11-22 MED ORDER — SODIUM CHLORIDE 0.9% FLUSH
3.0000 mL | Freq: Two times a day (BID) | INTRAVENOUS | Status: DC
  Administered 2015-11-23 – 2015-11-30 (×13): 3 mL via INTRAVENOUS

## 2015-11-22 MED ORDER — ACETAMINOPHEN 325 MG PO TABS
650.0000 mg | ORAL_TABLET | ORAL | Status: DC | PRN
Start: 2015-11-22 — End: 2015-11-30
  Administered 2015-11-23: 650 mg via ORAL

## 2015-11-22 MED ORDER — HYDROCODONE-ACETAMINOPHEN 10-325 MG PO TABS
1.0000 | ORAL_TABLET | ORAL | Status: DC | PRN
  Administered 2015-11-23 – 2015-11-29 (×8): 1 via ORAL
  Filled 2015-11-22 (×11): qty 1

## 2015-11-22 MED ORDER — MIDODRINE HCL 5 MG PO TABS
5.0000 mg | ORAL_TABLET | ORAL | Status: DC
Start: 2015-11-24 — End: 2015-11-30
  Administered 2015-11-24 – 2015-11-29 (×5): 5 mg via ORAL
  Filled 2015-11-22 (×4): qty 1

## 2015-11-22 MED ORDER — METRONIDAZOLE IN NACL 5-0.79 MG/ML-% IV SOLN
500.0000 mg | Freq: Three times a day (TID) | INTRAVENOUS | Status: DC
  Administered 2015-11-23: 500 mg via INTRAVENOUS
  Filled 2015-11-22: qty 100

## 2015-11-22 MED ORDER — PIPERACILLIN-TAZOBACTAM 3.375 G IVPB 30 MIN
3.3750 g | Freq: Once | INTRAVENOUS | Status: DC
  Administered 2015-11-22: 3.375 g via INTRAVENOUS
  Filled 2015-11-22: qty 50

## 2015-11-22 MED ORDER — GABAPENTIN 100 MG PO CAPS
100.0000 mg | ORAL_CAPSULE | Freq: Every day | ORAL | Status: DC
  Administered 2015-11-23 – 2015-11-29 (×8): 100 mg via ORAL
  Filled 2015-11-22 (×8): qty 1

## 2015-11-22 MED ORDER — INSULIN ASPART 100 UNIT/ML ~~LOC~~ SOLN
0.0000 [IU] | Freq: Every day | SUBCUTANEOUS | Status: DC
  Filled 2015-11-22: qty 1

## 2015-11-22 MED ORDER — PRO-STAT SUGAR FREE PO LIQD
30.0000 mL | Freq: Every day | ORAL | Status: DC
  Administered 2015-11-23 – 2015-11-30 (×6): 30 mL via ORAL
  Filled 2015-11-22 (×7): qty 30

## 2015-11-22 MED ORDER — FENTANYL CITRATE (PF) 100 MCG/2ML IJ SOLN
50.0000 ug | Freq: Once | INTRAMUSCULAR | Status: AC
  Administered 2015-11-22: 50 ug via INTRAVENOUS
  Filled 2015-11-22: qty 2

## 2015-11-22 MED ORDER — PIPERACILLIN-TAZOBACTAM IN DEX 2-0.25 GM/50ML IV SOLN
2.2500 g | Freq: Three times a day (TID) | INTRAVENOUS | Status: DC
  Filled 2015-11-22 (×2): qty 50

## 2015-11-22 MED ORDER — MORPHINE SULFATE (PF) 2 MG/ML IV SOLN
2.0000 mg | INTRAVENOUS | Status: DC | PRN
  Administered 2015-11-23: 2 mg via INTRAVENOUS
  Filled 2015-11-22: qty 1

## 2015-11-22 MED ORDER — LABETALOL HCL 5 MG/ML IV SOLN: 2.5000 mg | Freq: Once | INTRAVENOUS | Status: DC

## 2015-11-22 MED ORDER — MIDODRINE HCL 5 MG PO TABS
5.0000 mg | ORAL_TABLET | ORAL | Status: DC
  Administered 2015-11-23 – 2015-11-30 (×11): 5 mg via ORAL
  Filled 2015-11-22 (×12): qty 1

## 2015-11-22 MED ORDER — INSULIN ASPART 100 UNIT/ML ~~LOC~~ SOLN
0.0000 [IU] | Freq: Three times a day (TID) | SUBCUTANEOUS | Status: DC
  Administered 2015-11-23 (×2): 2 [IU] via SUBCUTANEOUS
  Administered 2015-11-24 – 2015-11-25 (×3): 1 [IU] via SUBCUTANEOUS
  Administered 2015-11-26 (×2): 2 [IU] via SUBCUTANEOUS
  Administered 2015-11-29: 1 [IU] via SUBCUTANEOUS
  Administered 2015-11-30: 2 [IU] via SUBCUTANEOUS

## 2015-11-22 MED ORDER — CIPROFLOXACIN IN D5W 400 MG/200ML IV SOLN
400.0000 mg | Freq: Every day | INTRAVENOUS | Status: DC
  Administered 2015-11-23: 400 mg via INTRAVENOUS
  Filled 2015-11-22: qty 200

## 2015-11-22 MED ORDER — IOPAMIDOL (ISOVUE-300) INJECTION 61%
INTRAVENOUS | Status: DC
Start: 2015-11-22 — End: 2015-11-22
  Filled 2015-11-22: qty 100

## 2015-11-22 MED ORDER — ATORVASTATIN CALCIUM 20 MG PO TABS
20.0000 mg | ORAL_TABLET | Freq: Every day | ORAL | Status: DC
  Administered 2015-11-23 – 2015-11-29 (×8): 20 mg via ORAL
  Filled 2015-11-22: qty 1
  Filled 2015-11-22: qty 2
  Filled 2015-11-22 (×7): qty 1

## 2015-11-22 MED ORDER — HYDROXYZINE HCL 50 MG/ML IM SOLN
25.0000 mg | Freq: Four times a day (QID) | INTRAMUSCULAR | Status: DC | PRN
  Administered 2015-11-26: 25 mg via INTRAMUSCULAR
  Filled 2015-11-22: qty 1
  Filled 2015-11-22: qty 0.5

## 2015-11-22 MED ORDER — ASPIRIN 81 MG PO CHEW
81.0000 mg | CHEWABLE_TABLET | Freq: Every day | ORAL | Status: DC
  Administered 2015-11-23 – 2015-11-26 (×4): 81 mg via ORAL
  Filled 2015-11-22 (×5): qty 1

## 2015-11-22 MED ORDER — DIATRIZOATE MEGLUMINE & SODIUM 66-10 % PO SOLN
ORAL | Status: AC
  Filled 2015-11-22: qty 30

## 2015-11-22 NOTE — ED Notes (Signed)
EDP patient does not have an IV or lab drawn Phlebotomy states unable to get blood work. IV team at the bedside.

## 2015-11-22 NOTE — ED Notes (Signed)
RN contacted EDP about IV still place. EDP states she will place shortly.

## 2015-11-22 NOTE — ED Notes (Signed)
Pt taken to CT.

## 2015-11-22 NOTE — ED Provider Notes (Signed)
?  free air under diaphragm on CXR Portable abdominal film ordered Also awaiting labs/IV as pt is difficult IV stick   Ripley Fraise, MD 11/22/15 1859

## 2015-11-22 NOTE — Progress Notes (Signed)
Pharmacy Antibiotic Note  Sean [name] is a 80 y.o. male admitted on 11/22/2015 with intra-abdominal infection.  Pharmacy has been consulted for zosyn dosing.  Plan: Zosyn 2.25g IV Q8H Monitor renal function closely for dose adjustment Follow culture results for antibiotic adjustment    Temp (24hrs), Avg:98.6 F (37 C), Min:98.6 F (37 C), Max:98.6 F (37 C)  No results for input(s): WBC, CREATININE, LATICACIDVEN, VANCOTROUGH, VANCOPEAK, VANCORANDOM, GENTTROUGH, GENTPEAK, GENTRANDOM, TOBRATROUGH, TOBRAPEAK, TOBRARND, AMIKACINPEAK, AMIKACINTROU, AMIKACIN in the last 168 hours.  Estimated Creatinine Clearance: 9.6 mL/min (by C-G formula based on Cr of 6.38).    Allergies  Allergen Reactions  . Benazepril Hcl Hives, Itching and Rash    flareups on patient head    Antimicrobials this admission: 6/12 Zosyn >>   Microbiology results: NA  Thank you for allowing pharmacy to be a part of this patient's care.  Melburn Popper, PharmD Clinical Pharmacy Resident Pager: [phone] 11/22/2015 4:37 PM

## 2015-11-22 NOTE — ED Notes (Signed)
Patient comes from [facility] for post CVA post MI. Patient started having Diarrhea since Friday. Seen Md advised he has a blockage and would a procedure tomorrow. Patient started having Abd pain and fever 103.1 . Facility gave 650 tylenol.

## 2015-11-22 NOTE — ED Notes (Signed)
Pt c/o abdominal pain and diarrhea since Saturday. Pt has had multiple attempts at IV access. Reports last IV placement was in the hand and heating packs had to be applied. Heating packs applied to R arm.

## 2015-11-22 NOTE — ED Notes (Signed)
No recommendations at this time from EDP

## 2015-11-22 NOTE — H&P (Signed)
History and Physical    Sean [name] [id number] DOB: 1936/03/12 DOA: 11/22/2015  Referring MD/NP/PA:   PCP: [name], MD   Patient coming from:  The patient is coming SNF.  At baseline, pt is dependent for most of ADL.      Chief Complaint: Diarrhea, abdominal pain and fever  HPI: Sean [name] is a 80 y.o. male with medical history significant of ischemic colitis s/p bowel resection, cholelithiasis, gallstone pancreatitis, diet controlled DM2, ESRD on Hd (MWF), caciphylaxis, s/p Left AKA, hypotension, HLD, anemia, depression with anxiety; who presents with diarrhea and abdominal pain and fever.   Patient reports that he has been having diarrhea in the past 3 days. He has 6-7 bowel movement with loose stool. He has abdominal pain over right middle quadrant, constant, 8 out of 10 in severity, nonradiating. Patient has nausea, but no vomiting. Patient reports that he has a fever of 103.1 at home. Patient does not have symptoms of UTI, chest pain, shortness of breath, cough, unilateral weakness. He states that he missed the dialysis on Monday.  ED Course: pt was found to have positive C. difficile PCR and antigen, lactate 1.91--> 3.8, WBC 9.5, temperature 99.7, creatinine 8.69, potassium 3.5, BUN 35, bicarbonate 23. Chest x-ray showed bilateral atelectasis. Pt is admitted to tele bed for further evaluate the treatment as inpatient.  # CT abdomen/pelvis showed 1. An infiltrating pancreatic neoplasm is suspected. This could be better defined with pancreatic MRI with and without contrast if the patient can tolerate that procedure. There is evidence of gastrohepatic ligament chain adenopathy, extending along the porta hepatis. 2. Mild wall thickening of the descending colon, sigmoid colon and rectum consistent with a mild infectious or inflammatory colitis. No evidence of bowel obstruction. 3. Stable splenomegaly. 4. Distended gallbladder with dependent gallstones but no convincing  acute cholecystitis. 5. Significant renal parenchymal thinning with multiple cysts with acquired renal cystic disease. 6. Increased opacity the base of the left lower lobe, most likely atelectasis. Consider pneumonia in the proper clinical setting.   Review of Systems:   General: has fevers, chills, no changes in body weight, has poor appetite, has fatigue HEENT: no blurry vision, hearing changes or sore throat Pulm: no dyspnea, coughing, wheezing CV: no chest pain, no palpitations Abd: has nausea, no vomiting, has abdominal pain, diarrhea, no constipation GU: no dysuria, burning on urination, increased urinary frequency, hematuria  Ext: no leg edema. Has left AKA. Neuro: no unilateral weakness, numbness, or tingling, no vision change or hearing loss Skin: no rash MSK: No muscle spasm, no deformity, no limitation of range of movement in spin Heme: No easy bruising.  Travel history: No recent long distant travel.  Allergy:  Allergies  Allergen Reactions  . Benazepril Hcl Hives, Itching and Rash    flareups on patient head    Past Medical History  Diagnosis Date  . Hyperparathyroidism   . Arthritis   . Hyperlipidemia   . Hypertension   . Stroke ([place])   . GERD (gastroesophageal reflux disease)   . Migraine   . Heart attack ([place]) 2011; 1990's  . Peripheral neuropathy ([place])   . Depression with anxiety   . Dialysis patient [facility])     M, W, F; [place] CAD (coronary artery disease)     a. s/p NSTEMI and failed RCA PCI in 2011. b. Nuc 12/2013: nonischemic, prior inferior MI.  Marland Kitchen LBBB (left bundle branch block)   . Calciphylaxis     a.  Multiple admissions for nonhealing L ulcer/sepsis, s/p L AKA 02/02/2014.  Marland Kitchen Chronic anemia   . Dementia   . ESRD (end stage renal disease) on dialysis ([place])   . Renal insufficiency   . Hypotension     renovascular  . History of left bundle branch block   . Type II diabetes mellitus ([place])     not taking any medications.  .  Cholelithiasis 01/2014    acute cholecystitis managed with perc drain due to high surgical risk.     Past Surgical History  Procedure Laterality Date  . Parathyroidectomy  2011  . Capd insertion  09/12/2011    Procedure: LAPAROSCOPIC INSERTION CONTINUOUS AMBULATORY PERITONEAL DIALYSIS  (CAPD) CATHETER;  Surgeon: Edward Jolly, MD;  Location: [place];  Service: General;  Laterality: N/A;  . Sp av dialysis shunt access existing *l*  2003  . Dg av dialysis shunt intro ndl*r* or      "not working" (09/19/11)  . Capd removal  03/12/2012    Procedure: CONTINUOUS AMBULATORY PERITONEAL DIALYSIS  (CAPD) CATHETER REMOVAL;  Surgeon: Edward Jolly, MD;  Location: [place];  Service: General;  Laterality: N/A;  . Amputation Left 02/02/2014    Procedure: AMPUTATION ABOVE KNEE-LEFT;  Surgeon: Elam Dutch, MD;  Location: [place];  Service: Vascular;  Laterality: Left;  . I&d extremity Left 03/31/2014    Procedure: IRRIGATION AND DEBRIDEMENT EXTREMITY - LEFT ABOVE KNEE AMPUTATION;  Surgeon: Elam Dutch, MD;  Location: [place];  Service: Vascular;  Laterality: Left;  . Application of wound vac Left 03/31/2014    Procedure: APPLICATION OF WOUND VAC - LEFT AKA;  Surgeon: Elam Dutch, MD;  Location: [place];  Service: Vascular;  Laterality: Left;  . Colon surgery  2011    colon resection   . Colonoscopy with propofol N/A 07/14/2014    Procedure: COLONOSCOPY WITH PROPOFOL;  Surgeon: Jerene Bears, MD;  Location: [facility];  Service: Gastroenterology;  Laterality: N/A;    Social History:  reports that he quit smoking about 39 years ago. His smoking use included Cigarettes. He has a 1.75 pack-year smoking history. He has never used smokeless tobacco. He reports that he does not drink alcohol or use illicit drugs.  Family History:  Family History  Problem Relation Age of Onset  . Cancer Sister     breast  . Cancer Brother     lung  . Anesthesia problems Neg Hx   . Hypotension Neg Hx   .  Malignant hyperthermia Neg Hx   . Pseudochol deficiency Neg Hx      Prior to Admission medications   Medication Sig Start Date End Date Taking? Authorizing Provider  acetaminophen (TYLENOL) 325 MG tablet Take 650 mg by mouth every 6 (six) hours as needed for mild pain, fever or headache (DO NOT EXCEED 4 GM OF APAP IN 24 HOURS FORM ALL SOURCES).     Historical Provider, MD  Amino Acids-Protein Hydrolys (FEEDING SUPPLEMENT, PRO-STAT SUGAR FREE 64,) LIQD Take 30 mLs by mouth daily.    Historical Provider, MD  aspirin 81 MG chewable tablet Chew 81 mg by mouth daily.     Historical Provider, MD  atorvastatin (LIPITOR) 20 MG tablet Take 20 mg by mouth at bedtime.     Historical Provider, MD  b-complex w/C (TOTAL B/C) TABS tablet Take 1 tablet by mouth at bedtime.    Historical Provider, MD  docusate sodium (COLACE) 100 MG capsule Take 100 mg by mouth daily.  Historical Provider, MD  gabapentin (NEURONTIN) 100 MG capsule Take 100 mg by mouth at bedtime.     Historical Provider, MD  HYDROcodone-acetaminophen (NORCO) 10-325 MG tablet Take one tablet by mouth every 8 hours as needed for moderate pain. Do not exceed 4gm of Tylenol in 24 hours 11/11/15   Tiffany L Reed, DO  midodrine (PROAMATINE) 5 MG tablet Take 5 mg by mouth 3 (three) times daily. Do not take 6 am dose on M- W- F- d/t  dialysis days. 02/01/15   Historical Provider, MD  ondansetron (ZOFRAN) 4 MG tablet Take 4 mg by mouth every 6 (six) hours as needed for nausea.    Historical Provider, MD  sevelamer carbonate (RENVELA) 800 MG tablet Take 800 mg by mouth daily with lunch. 12 pm and 5 pm.    Historical Provider, MD  sorbitol 70 % solution Take 30 mLs by mouth daily as needed (severe constipation).    Historical Provider, MD    Physical Exam: Filed Vitals:   11/22/15 2330 11/23/15 0000 11/23/15 0030 11/23/15 0100  BP: 128/47 121/54 130/47 147/56  Pulse: 70 33 66 69  Temp:      TempSrc:      Resp: 18 18 14 19   SpO2: 93% 97% 98% 90%    General: Not in acute distress HEENT:       Eyes: PERRL, EOMI, no scleral icterus.       ENT: No discharge from the ears and nose, no pharynx injection, no tonsillar enlargement.        Neck: No JVD, no bruit, no mass felt. Heme: No neck lymph node enlargement. Cardiac: S1/S2, RRR, No murmurs, No gallops or rubs. Pulm:  No rales, wheezing, rhonchi or rubs. Abd: Soft, nondistended, has tenderness over RMQ, no rebound pain, no organomegaly, BS present. GU: No hematuria Ext: No pitting leg edema bilaterally. 2+DP/PT pulse on the right leg. S/p of right AKA. Musculoskeletal: No joint deformities, No joint redness or warmth, no limitation of ROM in spin. Skin: No rashes.  Neuro: Alert, oriented X3, cranial nerves II-XII grossly intact, moves all extremities normally. Labs on Admission: I have personally reviewed following labs and imaging studies  CBC:  Recent Labs Lab 11/22/15 1955  WBC 9.5  NEUTROABS 7.5  HGB 10.9*  HCT 34.9*  MCV 83.5  PLT 123456*   Basic Metabolic Panel:  Recent Labs Lab 11/22/15 1955  NA 136  K 3.5  CL 99*  CO2 23  GLUCOSE 107*  BUN 35*  CREATININE 8.69*  CALCIUM 8.0*   GFR: Estimated Creatinine Clearance: 7.1 mL/min (by C-G formula based on Cr of 8.69). Liver Function Tests:  Recent Labs Lab 11/22/15 1955  AST 19  ALT 10*  ALKPHOS 144*  BILITOT 1.7*  PROT 7.2  ALBUMIN 3.1*    Recent Labs Lab 11/22/15 1955  LIPASE 34   No results for input(s): AMMONIA in the last 168 hours. Coagulation Profile:  Recent Labs Lab 11/22/15 0010  INR 1.49   Cardiac Enzymes: No results for input(s): CKTOTAL, CKMB, CKMBINDEX, TROPONINI in the last 168 hours. BNP (last 3 results) No results for input(s): PROBNP in the last 8760 hours. HbA1C: No results for input(s): HGBA1C in the last 72 hours. CBG:  Recent Labs Lab 11/23/15 0022  GLUCAP 104*   Lipid Profile: No results for input(s): CHOL, HDL, LDLCALC, TRIG, CHOLHDL, LDLDIRECT in the last  72 hours. Thyroid Function Tests: No results for input(s): TSH, T4TOTAL, FREET4, T3FREE, THYROIDAB in the last 72  hours. Anemia Panel: No results for input(s): VITAMINB12, FOLATE, FERRITIN, TIBC, IRON, RETICCTPCT in the last 72 hours. Urine analysis: No results found for: COLORURINE, APPEARANCEUR, LABSPEC, [place], GLUCOSEU, HGBUR, BILIRUBINUR, KETONESUR, PROTEINUR, UROBILINOGEN, NITRITE, LEUKOCYTESUR Sepsis Labs: @LABRCNTIP (procalcitonin:4,lacticidven:4) ) Recent Results (from the past 240 hour(s))  C difficile quick scan w PCR reflex     Status: Abnormal   Collection Time: 11/22/15 11:27 PM  Result Value Ref Range Status   C Diff antigen POSITIVE (A) NEGATIVE Final   C Diff toxin POSITIVE (A) NEGATIVE Final   C Diff interpretation Positive for toxigenic C. difficile  Final    Comment: CRITICAL RESULT CALLED TO, READ BACK BY AND VERIFIED WITH: B OLDAND,RN @0100  11/23/15 MKELLY      Radiological Exams on Admission: Ct Abdomen Pelvis Wo Contrast  11/22/2015  CLINICAL DATA:  Abdominal pain beginning over the last 2 days. No vomiting. Recent diagnosis of abdominal blockage. EXAM: CT ABDOMEN AND PELVIS WITHOUT CONTRAST TECHNIQUE: Multidetector CT imaging of the abdomen and pelvis was performed following the standard protocol without IV contrast. COMPARISON:  CTs, 11/07/2015 and 05/03/2014. FINDINGS: Lung bases: Heart is mildly enlarged. There are dense coronary artery calcifications. There is patchy and linear lung base opacity most evident in the left lower lobe, likely atelectasis. Pneumonia is possible. No pulmonary edema. No pleural effusion. Hepatobiliary: Liver is unremarkable. Gallbladder is distended. There are dependent stones but no convincing wall thickening or pericholecystic fluid. No bile duct dilation. Spleen: Mild enlargement measuring 16.5 x 6.7 x 15.4 cm. No splenic mass or focal lesion. Stable. Pancreas: Pancreas is thickened, particularly the pancreatic head, where it  surrounds the celiac axis and superior mesenteric artery vasculature. Lower attenuation areas in the pancreatic tail suggest poorly defined cysts or an irregular dilated duct. Margins of the pancreas are ill-defined. This appearance is similar to the prior exam. Additional soft tissue attenuation extends along the gastrohepatic ligament consistent with poorly defined adenopathy. Largest presumed node measures 16 mm in short axis. Adrenal glands:  No masses. Kidneys, ureters, bladder: Marked renal parenchymal thinning. Numerous bilateral renal cysts. No hydronephrosis. Ureters normal course and in caliber. Bladder is decompressed. Prostate gland:  Mild to moderately enlarged but stable. Ascites:  None. Gastrointestinal: Mild wall thickening suggested along the descending and sigmoid colon extending to the rectum. There is no bowel dilation to suggest obstruction. There are changes from a previous partial right colectomy, stable. Vascular: There are extensive atherosclerotic calcifications throughout the aorta and branch vessels. Musculoskeletal: Large subchondral cysts are noted along the superior acetabula. There is increased density in the bony structures consistent with renal osteodystrophy. There multiple Schmorl's nodes along the spine. Abdominal wall: Small fat containing left para midline abdominal wall hernia just below the umbilicus. IMPRESSION: 1. Abnormal appearance of the pancreas. An infiltrating pancreatic neoplasm is suspected. This could be better defined with pancreatic MRI with and without contrast if the patient can tolerate that procedure. There is evidence of gastrohepatic ligament chain adenopathy, extending along the porta hepatis. 2. Mild wall thickening of the descending colon, sigmoid colon and rectum consistent with a mild infectious or inflammatory colitis. No evidence of bowel obstruction. 3. Stable splenomegaly. 4. Distended gallbladder with dependent gallstones but no convincing acute  cholecystitis. 5. Significant renal parenchymal thinning with multiple cysts with acquired renal cystic disease. 6. Increased opacity the base of the left lower lobe, most likely atelectasis. Consider pneumonia in the proper clinical setting. Electronically Signed   By: Lajean Manes M.D.   On: 11/22/2015  19:46   Dg Chest Port 1 View  11/22/2015  CLINICAL DATA:  Patient comes from [facility] for post CVA post MI. Patient started having Diarrhea since Friday. Patient started having Abd pain and fever 103.1 . Hx of diabetes, stroke, HTN. EXAM: PORTABLE CHEST 1 VIEW COMPARISON:  05/03/2014 FINDINGS: Nasogastric tube has been removed. Heart is mildly enlarged. Film is made with shallow lung inflation. There is linear density and adjacent lucency at the right lung base, likely related to linear atelectasis. However it is difficult to exclude free intraperitoneal air. There is focal atelectasis or early infiltrate at the left lung base. Mild pulmonary edema is noted. IMPRESSION: 1. Question of free intraperitoneal air beneath the right hemidiaphragm. Further evaluation with left lateral decubitus view of the abdomen or CT of the abdomen and pelvis is recommended. 2. Bibasilar atelectasis or infiltrates, left greater than right. 3. Mild interstitial pulmonary edema. Electronically Signed   By: Norva Pavlov M.D.   On: 11/22/2015 17:40   Dg Abd Portable 2v  11/22/2015  CLINICAL DATA:  Diarrhea since Friday with history previous small-bowel obstruction. EXAM: PORTABLE ABDOMEN - 2 VIEW COMPARISON:  X-ray 11/08/2015.  CT scan 10/30/2015. FINDINGS: Right-side-up decubitus film shows no evidence for intraperitoneal free air. Diffuse gaseous small bowel distention is noted. There is some high attenuation which appears to be in small bowel loops over the central abdomen, nonspecific. Given the central clustering of small bowel , ventral hernia would be a consideration. Bones are diffusely demineralized. , IMPRESSION:  Diffuse gaseous bowel distention without evidence for intraperitoneal free air. Ileus or small-bowel obstruction could have this appearance. Central clustering of small bowel raises the question for bowel herniation into a ventral hernia which was seen on the previous CT scan. Electronically Signed   By: Kennith Center M.D.   On: 11/22/2015 19:03     EKG: Independently reviewed. Sinus rhythm, QTC 522, old LBBB, LAD  Assessment/Plan Principal Problem:   C. difficile colitis Active Problems:   Anxiety state   End stage renal disease on dialysis (HCC)   CAD (coronary artery disease), native coronary artery   Secondary hyperparathyroidism (HCC)   Depression   Dyslipidemia   S/P AKA (above knee amputation) unilateral (HCC)   Cholelithiasis   Ischemic colitis (HCC)   Type 2 diabetes mellitus with chronic kidney disease on chronic dialysis, without long-term current use of insulin (HCC)   PAD (peripheral artery disease) (HCC)   Gallstone pancreatitis   Chronic systolic CHF (congestive heart failure) (HCC)   Diarrhea   Sepsis (HCC)   C. difficile colitis and sepsis: pt has positive C. difficile PCR and antigen, indicating his fever, diarrhea and abdominal pain are likely caused by C. difficile colitis. Patient is septic with elevated lactate 3.8 and tachypnea. Currently hemodynamically stable.  -pt was admitted to tele bed as inpt. -start oral Vancomycin (pt was initially started with Zosyn, then switched to Cipro plus Flagyl before C. diff PCR comes back) -will get Procalcitonin and trend lactic acid levels per sepsis protocol. -IVF: 1.5L of NS bolus in ED, followed by 75 cc/h (patient has ESRD-HD and missed HD today, limiting aggressive IV fluids treatment). -When necessary hydroxyzine for nausea, Norco and morphine for pain -Hold sorbitol and colace  Gallstone pancreatitis: pt was recently diganosed with gallstone pancreatitis. Patient is supposed to have surgery tomorrow. EPD consulted  general surgeon (EDP did not record surgeon's name), will see pt in AM. -will f/u general surgeon's recommendation  End stage renal disease  on dialysis:  -left message to renal box for HD -continue Rnvela  Hx of CAD (coronary artery disease), native coronary artery: no CP -continue ASA and lipitor  HLD: Last LDL was 73 on 01/20/15 -Continue home medications: Lipitor  Diet controled DM-II: Last A1c 6.6, well controled. Patient is not taking meds at home -SSI  Chronic systolic CHF (congestive heart failure) ([place]): 2-D echo on 03/09/59 showed grade 1 diastolic dysfunction (EF was not recorded). CHF seems to be compensated on admission. -Volume will be managed per renal by HD   DVT ppx: SQ Heparin   Code Status: Full code Family Communication: None at bed side.   Disposition Plan:  Anticipate discharge back to previous SNF environment Consults called:  EPD consulted general surgeon (EDP did not record surgeon's name) Admission status: Obs / inpt  Date of Service 11/23/2015    Ivor Costa [facility] Pager [phone]  If 7PM-7AM, please contact night-coverage [email or web address] Password [email or web address] 11/23/2015, 1:43 AM

## 2015-11-22 NOTE — ED Notes (Signed)
Resident unable to get IV or blood work. States will speak with EDP.

## 2015-11-22 NOTE — ED Notes (Signed)
Rn attmpted IV patient request IV team states last time he was stuck 6 times before getting an IV.

## 2015-11-22 NOTE — ED Provider Notes (Signed)
Patient seen/examined in the Emergency Department in conjunction with Resident Physician Provider Ruch Patient reports abdominal pain and fever reported at nursing facility Exam : awake/alert, diffuse moderate abdominal tenderness Plan: pt currently stable, but will need CT imaging   Ripley Fraise, MD 11/22/15 1738

## 2015-11-22 NOTE — ED Provider Notes (Signed)
CSN: [id number]     Arrival date & time 11/22/15  1514 History   First MD Initiated Contact with Patient 11/22/15 1559     Chief Complaint  Patient presents with  . Diarrhea   HPI  Pt arrives by [facility] from [facility] with c/o lower abdominal pain. Stated it has happened before and that he has constipation issues. His last BM was this morning, but pt states he had to strain very hard. Pt is anuric, has dialysis MWF, left arm fistula. Pt is blind in right eye. Review of records reveals recent readmission from 11/06/15-11/10/15 with acute abdominal pain with gallstone pancreatitis and small bowel obstruction with ileus. GI and general surgery were consulted. He was kept NPO and diet was advanced slowly as tolerated. He declined cholecystectomy. Patient today did not go to dialysis as he felt sick. He denies any chest pain, shortness of breath. He had a temperature of 103 at facility today. It was treated with recent antibiotics but no history of C. Difficile. Patient states he has significant abdominal pain that is diffuse, worse on the right upper quadrant and left lower quadrant.  Past Medical History  Diagnosis Date  . Hyperparathyroidism   . Arthritis   . Hyperlipidemia   . Hypertension   . Stroke ([place])   . GERD (gastroesophageal reflux disease)   . Migraine   . Heart attack ([place]) 2011; 1990's  . Peripheral neuropathy ([place])   . Depression with anxiety   . Dialysis patient [facility])     M, W, F; [place] CAD (coronary artery disease)     a. s/p NSTEMI and failed RCA PCI in 2011. b. Nuc 12/2013: nonischemic, prior inferior MI.  Marland Kitchen LBBB (left bundle branch block)   . Calciphylaxis     a. Multiple admissions for nonhealing L ulcer/sepsis, s/p L AKA 02/02/2014.  Marland Kitchen Chronic anemia   . Dementia   . ESRD (end stage renal disease) on dialysis ([place])   . Renal insufficiency   . Hypotension     renovascular  . History of left bundle branch block   . Type II diabetes mellitus ([facility])      not taking any medications.  . Cholelithiasis 01/2014    acute cholecystitis managed with perc drain due to high surgical risk.    Past Surgical History  Procedure Laterality Date  . Parathyroidectomy  2011  . Capd insertion  09/12/2011    Procedure: LAPAROSCOPIC INSERTION CONTINUOUS AMBULATORY PERITONEAL DIALYSIS  (CAPD) CATHETER;  Surgeon: Edward Jolly, MD;  Location: [place];  Service: General;  Laterality: N/A;  . Sp av dialysis shunt access existing *l*  2003  . Dg av dialysis shunt intro ndl*r* or      "not working" (09/19/11)  . Capd removal  03/12/2012    Procedure: CONTINUOUS AMBULATORY PERITONEAL DIALYSIS  (CAPD) CATHETER REMOVAL;  Surgeon: Edward Jolly, MD;  Location: [place];  Service: General;  Laterality: N/A;  . Amputation Left 02/02/2014    Procedure: AMPUTATION ABOVE KNEE-LEFT;  Surgeon: Elam Dutch, MD;  Location: [place];  Service: Vascular;  Laterality: Left;  . I&d extremity Left 03/31/2014    Procedure: IRRIGATION AND DEBRIDEMENT EXTREMITY - LEFT ABOVE KNEE AMPUTATION;  Surgeon: Elam Dutch, MD;  Location: [place];  Service: Vascular;  Laterality: Left;  . Application of wound vac Left 03/31/2014    Procedure: APPLICATION OF WOUND VAC - LEFT AKA;  Surgeon: Elam Dutch, MD;  Location: [place];  Service: Vascular;  Laterality: Left;  . Colon surgery  2011    colon resection   . Colonoscopy with propofol N/A 07/14/2014    Procedure: COLONOSCOPY WITH PROPOFOL;  Surgeon: Jerene Bears, MD;  Location: [facility];  Service: Gastroenterology;  Laterality: N/A;   Family History  Problem Relation Age of Onset  . Cancer Sister     breast  . Cancer Brother     lung  . Anesthesia problems Neg Hx   . Hypotension Neg Hx   . Malignant hyperthermia Neg Hx   . Pseudochol deficiency Neg Hx    Social History  Substance Use Topics  . Smoking status: Former Smoker -- 0.25 packs/day for 7 years    Types: Cigarettes    Quit date: 06/12/1976  . Smokeless tobacco:  Never Used  . Alcohol Use: No    Review of Systems  Constitutional: Positive for fever.  Respiratory: Negative for cough and shortness of breath.   Cardiovascular: Negative for chest pain and leg swelling.  Genitourinary: Negative for dysuria.  Allergic/Immunologic: Negative for immunocompromised state.  All other systems reviewed and are negative.     Allergies  Benazepril hcl  Home Medications   Prior to Admission medications   Medication Sig Start Date End Date Taking? Authorizing Provider  acetaminophen (TYLENOL) 325 MG tablet Take 650 mg by mouth every 4 (four) hours as needed for mild pain, fever or headache (DO NOT EXCEED 4 GM OF APAP IN 24 HOURS FORM ALL SOURCES).    Yes Historical Provider, MD  Amino Acids-Protein Hydrolys (FEEDING SUPPLEMENT, PRO-STAT SUGAR FREE 64,) LIQD Take 30 mLs by mouth daily.   Yes Historical Provider, MD  aspirin 81 MG chewable tablet Chew 81 mg by mouth daily.    Yes Historical Provider, MD  atorvastatin (LIPITOR) 20 MG tablet Take 20 mg by mouth at bedtime.    Yes Historical Provider, MD  b-complex w/C (TOTAL B/C) TABS tablet Take 1 tablet by mouth at bedtime.   Yes Historical Provider, MD  docusate sodium (COLACE) 100 MG capsule Take 100 mg by mouth daily.   Yes Historical Provider, MD  gabapentin (NEURONTIN) 100 MG capsule Take 100 mg by mouth at bedtime.    Yes Historical Provider, MD  loperamide (IMODIUM) 2 MG capsule Take 2-4 mg by mouth See admin instructions. 4 mg (as needed) initially followed by 2 mg after each loose stool   Yes Historical Provider, MD  midodrine (PROAMATINE) 5 MG tablet Take 5 mg by mouth 3 (three) times daily. Do not take 6 am dose on M- W- F- d/t  dialysis days. 02/01/15  Yes Historical Provider, MD  ondansetron (ZOFRAN) 4 MG tablet Take 4 mg by mouth every 6 (six) hours as needed for nausea.   Yes Historical Provider, MD  sevelamer carbonate (RENVELA) 800 MG tablet Take 800 mg by mouth daily with lunch. 12 pm and 5 pm.    Yes Historical Provider, MD  sorbitol 70 % solution Take 30 mLs by mouth daily as needed (severe constipation).   Yes Historical Provider, MD  HYDROcodone-acetaminophen (NORCO) 10-325 MG tablet Take one tablet by mouth every 8 hours as needed for moderate pain. Do not exceed 4gm of Tylenol in 24 hours Patient taking differently: Take one tablet by mouth every 8 hours as needed for moderate pain. Do not exceed 4gm of Tylenol from all sources in 24 hours 11/11/15   Tiffany L Reed, DO   BP 109/67 mmHg  Pulse 90  Temp(Src)  98.6 F (37 C) (Oral)  Resp 18  SpO2 98% Physical Exam  Constitutional: He appears well-developed and well-nourished. No distress.  HENT:  Head: Normocephalic and atraumatic.  Left Ear: External ear normal.  Eyes: Conjunctivae are normal. Pupils are equal, round, and reactive to light. Right eye exhibits no discharge. Left eye exhibits no discharge.  Neck: Normal range of motion. Neck supple.  Cardiovascular: Normal rate and regular rhythm.   No murmur heard. Pulmonary/Chest: Effort normal and breath sounds normal. No respiratory distress.  Abdominal: Bowel sounds are normal. He exhibits no distension and no mass. There is tenderness (pos murphys sign). There is guarding. There is no rebound.  Distended abdomen  Musculoskeletal: He exhibits no edema.  Neurological: He is alert.  Skin: Skin is warm. He is not diaphoretic.  Psychiatric: He has a normal mood and affect.  Nursing note and vitals reviewed.   ED Course  Procedures (including critical care time) Labs Review Labs Reviewed  C DIFFICILE QUICK SCREEN W PCR REFLEX - Abnormal; Notable for the following:    C Diff antigen POSITIVE (*)    C Diff toxin POSITIVE (*)    All other components within normal limits  COMPREHENSIVE METABOLIC PANEL - Abnormal; Notable for the following:    Chloride 99 (*)    Glucose, Bld 107 (*)    BUN 35 (*)    Creatinine, Ser 8.69 (*)    Calcium 8.0 (*)    Albumin 3.1 (*)    ALT  10 (*)    Alkaline Phosphatase 144 (*)    Total Bilirubin 1.7 (*)    GFR calc non Af Amer 5 (*)    GFR calc Af Amer 6 (*)    All other components within normal limits  CBC WITH DIFFERENTIAL/PLATELET - Abnormal; Notable for the following:    RBC 4.18 (*)    Hemoglobin 10.9 (*)    HCT 34.9 (*)    RDW 16.5 (*)    Platelets 119 (*)    Lymphs Abs 0.6 (*)    Monocytes Absolute 1.4 (*)    All other components within normal limits  LACTIC ACID, PLASMA - Abnormal; Notable for the following:    Lactic Acid, Venous 3.8 (*)    All other components within normal limits  PROTIME-INR - Abnormal; Notable for the following:    Prothrombin Time 18.1 (*)    All other components within normal limits  APTT - Abnormal; Notable for the following:    aPTT 39 (*)    All other components within normal limits  CBG MONITORING, ED - Abnormal; Notable for the following:    Glucose-Capillary 104 (*)    All other components within normal limits  CULTURE, BLOOD (ROUTINE X 2)  CULTURE, BLOOD (ROUTINE X 2)  GASTROINTESTINAL PANEL BY PCR, STOOL (REPLACES STOOL CULTURE)  LIPASE, BLOOD  PROCALCITONIN  LACTIC ACID, PLASMA  BASIC METABOLIC PANEL  CBC  I-STAT CG4 LACTIC ACID, ED  I-STAT CG4 LACTIC ACID, ED  TYPE AND SCREEN    Imaging Review Ct Abdomen Pelvis Wo Contrast  11/22/2015  CLINICAL DATA:  Abdominal pain beginning over the last 2 days. No vomiting. Recent diagnosis of abdominal blockage. EXAM: CT ABDOMEN AND PELVIS WITHOUT CONTRAST TECHNIQUE: Multidetector CT imaging of the abdomen and pelvis was performed following the standard protocol without IV contrast. COMPARISON:  CTs, 11/07/2015 and 05/03/2014. FINDINGS: Lung bases: Heart is mildly enlarged. There are dense coronary artery calcifications. There is patchy and linear lung base opacity most  evident in the left lower lobe, likely atelectasis. Pneumonia is possible. No pulmonary edema. No pleural effusion. Hepatobiliary: Liver is unremarkable.  Gallbladder is distended. There are dependent stones but no convincing wall thickening or pericholecystic fluid. No bile duct dilation. Spleen: Mild enlargement measuring 16.5 x 6.7 x 15.4 cm. No splenic mass or focal lesion. Stable. Pancreas: Pancreas is thickened, particularly the pancreatic head, where it surrounds the celiac axis and superior mesenteric artery vasculature. Lower attenuation areas in the pancreatic tail suggest poorly defined cysts or an irregular dilated duct. Margins of the pancreas are ill-defined. This appearance is similar to the prior exam. Additional soft tissue attenuation extends along the gastrohepatic ligament consistent with poorly defined adenopathy. Largest presumed node measures 16 mm in short axis. Adrenal glands:  No masses. Kidneys, ureters, bladder: Marked renal parenchymal thinning. Numerous bilateral renal cysts. No hydronephrosis. Ureters normal course and in caliber. Bladder is decompressed. Prostate gland:  Mild to moderately enlarged but stable. Ascites:  None. Gastrointestinal: Mild wall thickening suggested along the descending and sigmoid colon extending to the rectum. There is no bowel dilation to suggest obstruction. There are changes from a previous partial right colectomy, stable. Vascular: There are extensive atherosclerotic calcifications throughout the aorta and branch vessels. Musculoskeletal: Large subchondral cysts are noted along the superior acetabula. There is increased density in the bony structures consistent with renal osteodystrophy. There multiple Schmorl's nodes along the spine. Abdominal wall: Small fat containing left para midline abdominal wall hernia just below the umbilicus. IMPRESSION: 1. Abnormal appearance of the pancreas. An infiltrating pancreatic neoplasm is suspected. This could be better defined with pancreatic MRI with and without contrast if the patient can tolerate that procedure. There is evidence of gastrohepatic ligament chain  adenopathy, extending along the porta hepatis. 2. Mild wall thickening of the descending colon, sigmoid colon and rectum consistent with a mild infectious or inflammatory colitis. No evidence of bowel obstruction. 3. Stable splenomegaly. 4. Distended gallbladder with dependent gallstones but no convincing acute cholecystitis. 5. Significant renal parenchymal thinning with multiple cysts with acquired renal cystic disease. 6. Increased opacity the base of the left lower lobe, most likely atelectasis. Consider pneumonia in the proper clinical setting. Electronically Signed   By: Lajean Manes M.D.   On: 11/22/2015 19:46   Dg Chest Port 1 View  11/22/2015  CLINICAL DATA:  Patient comes from [facility] for post CVA post MI. Patient started having Diarrhea since Friday. Patient started having Abd pain and fever 103.1 . Hx of diabetes, stroke, HTN. EXAM: PORTABLE CHEST 1 VIEW COMPARISON:  05/03/2014 FINDINGS: Nasogastric tube has been removed. Heart is mildly enlarged. Film is made with shallow lung inflation. There is linear density and adjacent lucency at the right lung base, likely related to linear atelectasis. However it is difficult to exclude free intraperitoneal air. There is focal atelectasis or early infiltrate at the left lung base. Mild pulmonary edema is noted. IMPRESSION: 1. Question of free intraperitoneal air beneath the right hemidiaphragm. Further evaluation with left lateral decubitus view of the abdomen or CT of the abdomen and pelvis is recommended. 2. Bibasilar atelectasis or infiltrates, left greater than right. 3. Mild interstitial pulmonary edema. Electronically Signed   By: Nolon Nations M.D.   On: 11/22/2015 17:40   Dg Abd Portable 2v  11/22/2015  CLINICAL DATA:  Diarrhea since Friday with history previous small-bowel obstruction. EXAM: PORTABLE ABDOMEN - 2 VIEW COMPARISON:  X-ray 11/08/2015.  CT scan 10/30/2015. FINDINGS: Right-side-up decubitus film shows no evidence  for  intraperitoneal free air. Diffuse gaseous small bowel distention is noted. There is some high attenuation which appears to be in small bowel loops over the central abdomen, nonspecific. Given the central clustering of small bowel , ventral hernia would be a consideration. Bones are diffusely demineralized. , IMPRESSION: Diffuse gaseous bowel distention without evidence for intraperitoneal free air. Ileus or small-bowel obstruction could have this appearance. Central clustering of small bowel raises the question for bowel herniation into a ventral hernia which was seen on the previous CT scan. Electronically Signed   By: Misty Stanley M.D.   On: 11/22/2015 19:03   I have personally reviewed and evaluated these images and lab results as part of my medical decision-making.   EKG Interpretation   Date/Time:  Monday November 22 2015 15:25:55 EDT Ventricular Rate:  87 PR Interval:  185 QRS Duration: 154 QT Interval:  434 QTC Calculation: 522 R Axis:   -37 Text Interpretation:  Sinus rhythm Multiple premature complexes, vent &  supraven Left bundle branch block No significant change since last tracing  Confirmed by Christy Gentles  MD, DONALD ([id number]) on 11/22/2015 3:50:22 PM      MDM   Final diagnoses:  Pain  C. difficile colitis   Patient exam highly concerning given significant abdominal tenderness, diarrhea, fever. Consent for C. difficile or small bowel obstruction or other process given recent complicated recent medical visit. Pending C. difficile. Patient is having a lactic acidosis. Antibiotics began for concern of intra-abdominal process. Initial x-ray concerning for intra-abdominal air. CT reassuring.  6:30 P after multiple nurses attempted, IV team attempted line placement, I was approached to attempt an ultrasound guided IV. Patient is refusing more than 1 or 2 attempts per provider and has difficult anatomy secondary to fistulous and dehydration. I attempted twice to place an ultrasound IV but  he began to threaten that he was going to hit the provider (me). I had to stop my attempt and patient no longer allowing any further ultrasound IV cannulation. I told the patient he is critically ill, concerned about a life-threatening perforation in his abdomen and severe infection that required immediate emergent antibiotics. Patient appears to be in his normal mental state and able to make decisions for herself, he is alert and oriented 3 and refusing IV placement. Went to patient began and attempted to convince patient to allow [place] to attempt a central line. Patient declining at this time. He understands that his wife is in danger but continues to refuse. He states he would like to speak with family over the phone to make this decision as he is "tired of suffering". I offered to attempt to call his family through the phone to expedite things and he states that he does not want to be brought into this. Her multiple discussions attempts, patient continues to be declining further treatment. Will await for family to arrive and attempted to gain better understanding of patient's priorities.  After multiple attempts by multiple providers, finally able to obtain IV access. Labs sent. Patient will be admitted for IV antibiotics and further monitoring.  Gen. surgery has been notified of patient. They will see tomorrow.  Subsequent C. difficile came back positve. Patient will require admission for treatment as well.  Karma Greaser, MD 11/23/15 BP:[id number]  Ripley Fraise, MD 11/23/15 [phone]

## 2015-11-22 NOTE — ED Notes (Signed)
Portable at bedside 

## 2015-11-22 NOTE — ED Notes (Signed)
EDP at bedside  

## 2015-11-23 ENCOUNTER — Encounter (HOSPITAL_COMMUNITY): Payer: Self-pay | Admitting: *Deleted

## 2015-11-23 DIAGNOSIS — F411 Generalized anxiety disorder: Secondary | ICD-10-CM | POA: Diagnosis present

## 2015-11-23 DIAGNOSIS — F0390 Unspecified dementia without behavioral disturbance: Secondary | ICD-10-CM | POA: Diagnosis present

## 2015-11-23 DIAGNOSIS — K210 Gastro-esophageal reflux disease with esophagitis: Secondary | ICD-10-CM | POA: Diagnosis present

## 2015-11-23 DIAGNOSIS — K920 Hematemesis: Secondary | ICD-10-CM | POA: Diagnosis not present

## 2015-11-23 DIAGNOSIS — E876 Hypokalemia: Secondary | ICD-10-CM | POA: Diagnosis present

## 2015-11-23 DIAGNOSIS — A414 Sepsis due to anaerobes: Secondary | ICD-10-CM | POA: Diagnosis present

## 2015-11-23 DIAGNOSIS — A047 Enterocolitis due to Clostridium difficile: Secondary | ICD-10-CM | POA: Diagnosis not present

## 2015-11-23 DIAGNOSIS — D62 Acute posthemorrhagic anemia: Secondary | ICD-10-CM | POA: Diagnosis not present

## 2015-11-23 DIAGNOSIS — I252 Old myocardial infarction: Secondary | ICD-10-CM | POA: Diagnosis not present

## 2015-11-23 DIAGNOSIS — Z9049 Acquired absence of other specified parts of digestive tract: Secondary | ICD-10-CM | POA: Diagnosis not present

## 2015-11-23 DIAGNOSIS — N186 End stage renal disease: Secondary | ICD-10-CM

## 2015-11-23 DIAGNOSIS — D638 Anemia in other chronic diseases classified elsewhere: Secondary | ICD-10-CM | POA: Diagnosis present

## 2015-11-23 DIAGNOSIS — Z87891 Personal history of nicotine dependence: Secondary | ICD-10-CM | POA: Diagnosis not present

## 2015-11-23 DIAGNOSIS — Z992 Dependence on renal dialysis: Secondary | ICD-10-CM | POA: Diagnosis not present

## 2015-11-23 DIAGNOSIS — K269 Duodenal ulcer, unspecified as acute or chronic, without hemorrhage or perforation: Secondary | ICD-10-CM | POA: Diagnosis not present

## 2015-11-23 DIAGNOSIS — E785 Hyperlipidemia, unspecified: Secondary | ICD-10-CM | POA: Diagnosis present

## 2015-11-23 DIAGNOSIS — Z8719 Personal history of other diseases of the digestive system: Secondary | ICD-10-CM | POA: Diagnosis not present

## 2015-11-23 DIAGNOSIS — I5042 Chronic combined systolic (congestive) and diastolic (congestive) heart failure: Secondary | ICD-10-CM | POA: Diagnosis present

## 2015-11-23 DIAGNOSIS — Z801 Family history of malignant neoplasm of trachea, bronchus and lung: Secondary | ICD-10-CM | POA: Diagnosis not present

## 2015-11-23 DIAGNOSIS — K869 Disease of pancreas, unspecified: Secondary | ICD-10-CM | POA: Diagnosis not present

## 2015-11-23 DIAGNOSIS — Z515 Encounter for palliative care: Secondary | ICD-10-CM | POA: Diagnosis not present

## 2015-11-23 DIAGNOSIS — E1122 Type 2 diabetes mellitus with diabetic chronic kidney disease: Secondary | ICD-10-CM | POA: Diagnosis present

## 2015-11-23 DIAGNOSIS — I959 Hypotension, unspecified: Secondary | ICD-10-CM | POA: Diagnosis present

## 2015-11-23 DIAGNOSIS — I2510 Atherosclerotic heart disease of native coronary artery without angina pectoris: Secondary | ICD-10-CM | POA: Diagnosis present

## 2015-11-23 DIAGNOSIS — E1151 Type 2 diabetes mellitus with diabetic peripheral angiopathy without gangrene: Secondary | ICD-10-CM | POA: Diagnosis present

## 2015-11-23 DIAGNOSIS — N2581 Secondary hyperparathyroidism of renal origin: Secondary | ICD-10-CM | POA: Diagnosis present

## 2015-11-23 DIAGNOSIS — H5441 Blindness, right eye, normal vision left eye: Secondary | ICD-10-CM | POA: Diagnosis present

## 2015-11-23 DIAGNOSIS — Z8711 Personal history of peptic ulcer disease: Secondary | ICD-10-CM | POA: Diagnosis not present

## 2015-11-23 DIAGNOSIS — J9811 Atelectasis: Secondary | ICD-10-CM | POA: Diagnosis present

## 2015-11-23 DIAGNOSIS — A0472 Enterocolitis due to Clostridium difficile, not specified as recurrent: Secondary | ICD-10-CM | POA: Diagnosis present

## 2015-11-23 DIAGNOSIS — A419 Sepsis, unspecified organism: Secondary | ICD-10-CM | POA: Diagnosis present

## 2015-11-23 DIAGNOSIS — Z8673 Personal history of transient ischemic attack (TIA), and cerebral infarction without residual deficits: Secondary | ICD-10-CM | POA: Diagnosis not present

## 2015-11-23 DIAGNOSIS — I739 Peripheral vascular disease, unspecified: Secondary | ICD-10-CM

## 2015-11-23 DIAGNOSIS — E1121 Type 2 diabetes mellitus with diabetic nephropathy: Secondary | ICD-10-CM | POA: Diagnosis present

## 2015-11-23 DIAGNOSIS — K264 Chronic or unspecified duodenal ulcer with hemorrhage: Secondary | ICD-10-CM | POA: Diagnosis present

## 2015-11-23 DIAGNOSIS — Z803 Family history of malignant neoplasm of breast: Secondary | ICD-10-CM | POA: Diagnosis not present

## 2015-11-23 DIAGNOSIS — K8020 Calculus of gallbladder without cholecystitis without obstruction: Secondary | ICD-10-CM | POA: Diagnosis present

## 2015-11-23 DIAGNOSIS — D509 Iron deficiency anemia, unspecified: Secondary | ICD-10-CM | POA: Diagnosis present

## 2015-11-23 DIAGNOSIS — F329 Major depressive disorder, single episode, unspecified: Secondary | ICD-10-CM | POA: Diagnosis not present

## 2015-11-23 DIAGNOSIS — Z888 Allergy status to other drugs, medicaments and biological substances status: Secondary | ICD-10-CM | POA: Diagnosis not present

## 2015-11-23 DIAGNOSIS — R1030 Lower abdominal pain, unspecified: Secondary | ICD-10-CM | POA: Diagnosis present

## 2015-11-23 DIAGNOSIS — I132 Hypertensive heart and chronic kidney disease with heart failure and with stage 5 chronic kidney disease, or end stage renal disease: Secondary | ICD-10-CM | POA: Diagnosis present

## 2015-11-23 DIAGNOSIS — K559 Vascular disorder of intestine, unspecified: Secondary | ICD-10-CM | POA: Diagnosis present

## 2015-11-23 DIAGNOSIS — E860 Dehydration: Secondary | ICD-10-CM | POA: Diagnosis present

## 2015-11-23 DIAGNOSIS — I5022 Chronic systolic (congestive) heart failure: Secondary | ICD-10-CM | POA: Diagnosis not present

## 2015-11-23 DIAGNOSIS — Z7982 Long term (current) use of aspirin: Secondary | ICD-10-CM | POA: Diagnosis not present

## 2015-11-23 DIAGNOSIS — Z89612 Acquired absence of left leg above knee: Secondary | ICD-10-CM | POA: Diagnosis not present

## 2015-11-23 DIAGNOSIS — K8689 Other specified diseases of pancreas: Secondary | ICD-10-CM | POA: Diagnosis present

## 2015-11-23 DIAGNOSIS — E892 Postprocedural hypoparathyroidism: Secondary | ICD-10-CM | POA: Diagnosis present

## 2015-11-23 DIAGNOSIS — K851 Biliary acute pancreatitis without necrosis or infection: Secondary | ICD-10-CM

## 2015-11-23 DIAGNOSIS — K209 Esophagitis, unspecified: Secondary | ICD-10-CM | POA: Diagnosis not present

## 2015-11-23 DIAGNOSIS — Z7189 Other specified counseling: Secondary | ICD-10-CM | POA: Diagnosis not present

## 2015-11-23 DIAGNOSIS — K921 Melena: Secondary | ICD-10-CM | POA: Diagnosis not present

## 2015-11-23 DIAGNOSIS — R161 Splenomegaly, not elsewhere classified: Secondary | ICD-10-CM | POA: Diagnosis present

## 2015-11-23 DIAGNOSIS — K922 Gastrointestinal hemorrhage, unspecified: Secondary | ICD-10-CM | POA: Diagnosis not present

## 2015-11-23 DIAGNOSIS — E872 Acidosis: Secondary | ICD-10-CM | POA: Diagnosis present

## 2015-11-23 LAB — GASTROINTESTINAL PANEL BY PCR, STOOL (REPLACES STOOL CULTURE)
Adenovirus F40/41: NOT DETECTED
Astrovirus: NOT DETECTED
CAMPYLOBACTER SPECIES: NOT DETECTED
Cryptosporidium: NOT DETECTED
Cyclospora cayetanensis: NOT DETECTED
E. COLI O157: NOT DETECTED
ENTEROAGGREGATIVE E COLI (EAEC): NOT DETECTED
Entamoeba histolytica: NOT DETECTED
Enteropathogenic E coli (EPEC): NOT DETECTED
Enterotoxigenic E coli (ETEC): NOT DETECTED
GIARDIA LAMBLIA: NOT DETECTED
NOROVIRUS GI/GII: NOT DETECTED
PLESIMONAS SHIGELLOIDES: NOT DETECTED
ROTAVIRUS A: NOT DETECTED
SALMONELLA SPECIES: NOT DETECTED
SHIGA LIKE TOXIN PRODUCING E COLI (STEC): NOT DETECTED
SHIGELLA/ENTEROINVASIVE E COLI (EIEC): NOT DETECTED
Sapovirus (I, II, IV, and V): NOT DETECTED
Vibrio cholerae: NOT DETECTED
Vibrio species: NOT DETECTED
Yersinia enterocolitica: NOT DETECTED

## 2015-11-23 LAB — LACTIC ACID, PLASMA
LACTIC ACID, VENOUS: 1.4 mmol/L (ref 0.5–2.0)
Lactic Acid, Venous: 3.8 mmol/L (ref 0.5–2.0)

## 2015-11-23 LAB — CBC
HEMATOCRIT: 29.3 % — AB (ref 39.0–52.0)
Hemoglobin: 9.2 g/dL — ABNORMAL LOW (ref 13.0–17.0)
MCH: 25.9 pg — ABNORMAL LOW (ref 26.0–34.0)
MCHC: 31.4 g/dL (ref 30.0–36.0)
MCV: 82.5 fL (ref 78.0–100.0)
Platelets: 108 10*3/uL — ABNORMAL LOW (ref 150–400)
RBC: 3.55 MIL/uL — ABNORMAL LOW (ref 4.22–5.81)
RDW: 16.4 % — AB (ref 11.5–15.5)
WBC: 8.2 10*3/uL (ref 4.0–10.5)

## 2015-11-23 LAB — C DIFFICILE QUICK SCREEN W PCR REFLEX
C DIFFICLE (CDIFF) ANTIGEN: POSITIVE — AB
C Diff interpretation: POSITIVE
C Diff toxin: POSITIVE — AB

## 2015-11-23 LAB — GLUCOSE, CAPILLARY
GLUCOSE-CAPILLARY: 153 mg/dL — AB (ref 65–99)
GLUCOSE-CAPILLARY: 133 mg/dL — AB (ref 65–99)
Glucose-Capillary: 94 mg/dL (ref 65–99)
Glucose-Capillary: 90 mg/dL (ref 65–99)
Glucose-Capillary: 175 mg/dL — ABNORMAL HIGH (ref 65–99)

## 2015-11-23 LAB — BASIC METABOLIC PANEL
Anion gap: 13 (ref 5–15)
BUN: 37 mg/dL — ABNORMAL HIGH (ref 6–20)
CHLORIDE: 102 mmol/L (ref 101–111)
CO2: 20 mmol/L — AB (ref 22–32)
Calcium: 7.4 mg/dL — ABNORMAL LOW (ref 8.9–10.3)
Creatinine, Ser: 8.52 mg/dL — ABNORMAL HIGH (ref 0.61–1.24)
GFR calc non Af Amer: 5 mL/min — ABNORMAL LOW (ref >60)
GFR, EST AFRICAN AMERICAN: 6 mL/min — AB (ref >60)
Glucose, Bld: 104 mg/dL — ABNORMAL HIGH (ref 65–99)
POTASSIUM: 3.4 mmol/L — AB (ref 3.5–5.1)
Sodium: 135 mmol/L (ref 135–145)

## 2015-11-23 LAB — PROTIME-INR
INR: 1.49 (ref 0.00–1.49)
PROTHROMBIN TIME: 18.1 s — AB (ref 11.6–15.2)

## 2015-11-23 LAB — MRSA PCR SCREENING: MRSA BY PCR: NEGATIVE

## 2015-11-23 LAB — APTT: aPTT: 39 seconds — ABNORMAL HIGH (ref 24–37)

## 2015-11-23 LAB — CBG MONITORING, ED: GLUCOSE-CAPILLARY: 104 mg/dL — AB (ref 65–99)

## 2015-11-23 LAB — PROCALCITONIN: Procalcitonin: 3.06 ng/mL

## 2015-11-23 MED ORDER — SODIUM CHLORIDE 0.9 % IV BOLUS (SEPSIS)
500.0000 mL | Freq: Once | INTRAVENOUS | Status: AC
Start: 2015-11-23 — End: 2015-11-23
  Administered 2015-11-23: 500 mL via INTRAVENOUS

## 2015-11-23 MED ORDER — SODIUM CHLORIDE 0.9 % IV BOLUS (SEPSIS)
1000.0000 mL | Freq: Once | INTRAVENOUS | Status: AC
  Administered 2015-11-23: 1000 mL via INTRAVENOUS

## 2015-11-23 MED ORDER — VANCOMYCIN 50 MG/ML ORAL SOLUTION
500.0000 mg | Freq: Four times a day (QID) | ORAL | Status: DC
  Administered 2015-11-23 – 2015-11-30 (×25): 500 mg via ORAL
  Filled 2015-11-23 (×36): qty 10

## 2015-11-23 MED ORDER — METRONIDAZOLE IN NACL 5-0.79 MG/ML-% IV SOLN
500.0000 mg | Freq: Three times a day (TID) | INTRAVENOUS | Status: DC
  Administered 2015-11-23 – 2015-11-29 (×19): 500 mg via INTRAVENOUS
  Filled 2015-11-23 (×19): qty 100

## 2015-11-23 NOTE — Progress Notes (Signed)
80 Y/O AA male admitted with C-Diff per primary on observation status. Went by to see patient to assess hemodialysis needs. Patient's last hemodialysis 11/19/15. Patient states he is not coming to hemodialysis today. Patient had NS @ 75 (no order on chart) and has trace LE edema. K+ 3.4. Stopped NS. Paged Dr. Murvin Natal to notify of patient's refusal of HD. No formal consult at this time. Please notify us if patient status changes to in-patient and we will consult formally.  Juanell Fairly, [facility] [phone]  [facility] pager [phone]    cell [phone] 11/23/2015, 11:34 AM

## 2015-11-23 NOTE — Evaluation (Signed)
Occupational Therapy Evaluation Patient Details Name: Sean [name] MRN: [id number] DOB: 1935/06/30 Today's Date: 11/23/2015    History of Present Illness HPI: Sean [name] is a 80 y.o. male with medical history significant of ischemic colitis s/p bowel resection, cholelithiasis, gallstone pancreatitis, diet controlled DM2, ESRD on Hd (MWF), caciphylaxis, s/p Left AKA, hypotension, HLD, anemia, depression with anxiety; who presents with diarrhea and abdominal pain and fever. positive CDiff colitis   Clinical Impression   Patient presenting with decreased ADL and functional mobility independence secondary to above. Patient required assistance and was staying in SNF PTA. Patient currently functioning at an overall min to total assist level and with decreased strength/increased weakness due to CDiff. Patient will benefit from acute OT to increase overall independence in the areas of ADLs, functional mobility, and overall safety in order to safely discharge back to SNF.     Follow Up Recommendations  SNF;Supervision/Assistance - 24 hour    Equipment Recommendations  Other (comment) (TBD)    Recommendations for Other Services  None at this time   Precautions / Restrictions Precautions Precautions: Fall Restrictions Weight Bearing Restrictions: No    Mobility Bed Mobility Overal bed mobility: Needs Assistance;+ 2 for safety/equipment Bed Mobility: Rolling;Sidelying to Sit Rolling: Min assist Sidelying to sit: Mod assist       General bed mobility comments: Assistance for management of BLEs, use of bed rails needed to power up into sitting- HOB flat  Transfers Overall transfer level: Needs assistance Equipment used: Rolling walker (2 wheeled) Transfers: Sit to/from Stand Sit to Stand: +2 safety/equipment;Min assist Stand pivot transfers: Min guard       General transfer comment: Pt specifically requested to not be assisted to power up; stood to RW with L prosthesis on;  heavy dependence on UE push up; min assist to steady RW and second persin for safety    Balance Overall balance assessment: Needs assistance Sitting-balance support: No upper extremity supported;Feet supported Sitting balance-Leahy Scale: Good Sitting balance - Comments: Pt donning prosthetic seated EOB performing lateral leans to correctly place the socks   Standing balance support: Bilateral upper extremity supported Standing balance-Leahy Scale: Poor    ADL Overall ADL's : Needs assistance/impaired Eating/Feeding: Minimal assistance;Sitting   Grooming: Minimal assistance;Sitting   Upper Body Bathing: Minimal assitance;Sitting   Lower Body Bathing: Maximal assistance;Sit to/from stand   Upper Body Dressing : Minimal assistance;Sitting   Lower Body Dressing: Maximal assistance;Sit to/from stand   Toilet Transfer: Moderate assistance;RW Toilet Transfer Details (indicate cue type and reason): simulated w/ prosthetic donned           General ADL Comments: Pt requires increased time and with difficulty multitasking    Vision Vision Assessment?: No apparent visual deficits          Pertinent Vitals/Pain Pain Assessment: 0-10 Pain Score: 5  Pain Location: abdominal soreness Pain Descriptors / Indicators: Discomfort Pain Intervention(s): Limited activity within patient's tolerance;Monitored during session     Hand Dominance Right   Extremity/Trunk Assessment Upper Extremity Assessment Upper Extremity Assessment: Defer to OT evaluation RUE Deficits / Details: decreased dexterity/fine & gross motor coordination, decreased overall functional strength LUE Deficits / Details: decreased dexterity/fine & gross motor coordination, decreased overall functional strength   Lower Extremity Assessment Lower Extremity Assessment: Generalized weakness;LLE deficits/detail LLE Deficits / Details: AKA; reports he still experiences phantom sensations   Cervical / Trunk  Assessment Cervical / Trunk Assessment: Normal   Communication Communication Communication: HOH;No difficulties   Cognition Arousal/Alertness: Awake/alert Behavior  During Therapy: WFL for tasks assessed/performed Overall Cognitive Status: Within Functional Limits for tasks assessed              Home Living Family/patient expects to be discharged to:: Skilled nursing facility   Prior Functioning/Environment Level of Independence: Needs assistance  Gait / Transfers Assistance Needed: Has been working with PT at [facility] on walking with prosthesis ADL's / Homemaking Assistance Needed: Bath Aide occasionally at [employer]        OT Diagnosis: Generalized weakness;Acute pain   OT Problem List: Decreased strength;Decreased range of motion;Decreased activity tolerance;Impaired balance (sitting and/or standing);Decreased coordination;Decreased safety awareness;Decreased knowledge of use of DME or AE;Decreased knowledge of precautions;Pain;Impaired sensation;Increased edema   OT Treatment/Interventions: Self-care/ADL training;Therapeutic exercise;Energy conservation;DME and/or AE instruction;Therapeutic activities;Patient/family education;Balance training    OT Goals(Current goals can be found in the care plan section) Acute Rehab OT Goals Patient Stated Goal: Sean [name] long term goal is to get to an Assisted Living facility OT Goal Formulation: With patient Time For Goal Achievement: 12/07/15 Potential to Achieve Goals: Good ADL Goals Pt Will Perform Grooming: with supervision;with set-up;sitting Pt Will Perform Upper Body Bathing: with set-up;with supervision;sitting Pt Will Perform Upper Body Dressing: with set-up;with supervision;sitting Pt Will Transfer to Toilet: with min assist;stand pivot transfer;bedside commode Pt/caregiver will Perform Home Exercise Program: Increased strength;Both right and left upper extremity;With theraputty;With theraband;With Supervision;With written HEP  provided  OT Frequency: Min 2X/week   Barriers to D/C: Decreased caregiver support       Co-evaluation PT/OT/SLP Co-Evaluation/Treatment: Yes Reason for Co-Treatment: Complexity of the patient's impairments (multi-system involvement);For patient/therapist safety PT goals addressed during session: Mobility/safety with mobility OT goals addressed during session: ADL's and self-care;Other (comment) (functional mobility)      End of Session Equipment Utilized During Treatment: Gait belt;Rolling walker;Other (comment) (L AKA prostthetic)  Activity Tolerance: Patient tolerated treatment well Patient left: in bed;with call bell/phone within reach;with bed alarm set (seated EOB)   Time: [id number] OT Time Calculation (min): 39 min Charges:  OT General Charges $OT Visit: 1 Procedure OT Treatments $Self Care/Home Management : 8-22 mins G-Codes: OT G-codes **NOT FOR INPATIENT CLASS** Functional Limitation: Self care Self Care Current Status [id number]): At least 60 percent but less than 80 percent impaired, limited or restricted Self Care Goal Status [id number]): At least 1 percent but less than 20 percent impaired, limited or restricted  Chrys Racer , MS, OTR/L, CLT Pager: [phone]  11/23/2015, 2:02 PM

## 2015-11-23 NOTE — Progress Notes (Signed)
PROGRESS NOTE    Sean [name]  [id number]  DOB: 12/08/1935  DOA: 11/22/2015 PCP: [name], MD Outpatient Specialists:   Hospital course: Sean [name] is a 80 y.o. male with medical history significant of ischemic colitis s/p bowel resection, cholelithiasis, gallstone pancreatitis, diet controlled DM2, ESRD on Hd (MWF), caciphylaxis, s/p Left AKA, hypotension, HLD, anemia, depression with anxiety; who presents with diarrhea and abdominal pain and fever.  Patient reports that he has been having diarrhea in the past 3 days. He has 6-7 bowel movement with loose stool. He has abdominal pain over right middle quadrant, constant, 8 out of 10 in severity, nonradiating. Patient has nausea, but no vomiting. Patient reports that he has a fever of 103.1 at home. Patient does not have symptoms of UTI, chest pain, shortness of breath, cough, unilateral weakness. He states that he missed the dialysis on Monday.  ED Course: pt was found to have positive C. difficile PCR and antigen, lactate 1.91--> 3.8, WBC 9.5, temperature 99.7, creatinine 8.69, potassium 3.5, BUN 35, bicarbonate 23. Chest x-ray showed bilateral atelectasis. Pt is admitted to tele bed for further evaluate the treatment as inpatient.  # CT abdomen/pelvis showed 1. An infiltrating pancreatic neoplasm is suspected. This could be better defined with pancreatic MRI with and without contrast if the patient can tolerate that procedure. There is evidence of gastrohepatic ligament chain adenopathy, extending along the porta hepatis. 2. Mild wall thickening of the descending colon, sigmoid colon and rectum consistent with a mild infectious or inflammatory colitis. No evidence of bowel obstruction. 3. Stable splenomegaly. 4. Distended gallbladder with dependent gallstones but no convincing acute cholecystitis. 5. Significant renal parenchymal thinning with multiple cysts with acquired renal cystic disease. 6. Increased opacity the base of  the left lower lobe, most likely atelectasis. Consider pneumonia in the proper clinical setting.   Assessment & Plan: C. difficile colitis and sepsis: pt has positive C. difficile PCR and antigen, indicating his fever, diarrhea and abdominal pain are likely caused by C. difficile colitis. Patient is septic with elevated lactate 3.8 and tachypnea. Currently hemodynamically stable. -pt was admitted to tele bed as inpt. -start oral Vancomycin (pt was initially started with Zosyn, then switched to Cipro plus Flagyl before C. diff PCR comes back), also on IV flagyl for dual coverage of C. Diff.  -will trend lactic acid levels per sepsis protocol. -IVF: 1.5L of NS bolus in ED, followed by 75 cc/h (patient has ESRD-HD and missed HD on 6/12 and refusing HD for 6/13).  -When necessary hydroxyzine for nausea, Norco and morphine for pain -Holding sorbitol and colace for now.   Gallstone pancreatitis: pt was recently diganosed with gallstone pancreatitis. Patient was supposed to have surgery 6/13.  Consulted general surgeon and will follow their recommendations.   ?Pancreatic Mass  - Consulted general surgery - Pt likely will need an MRI to better visualize lesion.   End stage renal disease on dialysis:  -left message to renal box for HD -continue Renvela  Hx of CAD (coronary artery disease), native coronary artery: no CP -continue ASA and lipitor  HLD: Last LDL was 73 on 01/20/15 -Continue home medications: Lipitor  Diet controled DM-II: Last A1c 6.6, well controled. Patient is not taking meds at home -SSI  Chronic systolic CHF (congestive heart failure) ([place]): 2-D echo on 03/09/59 showed grade 1 diastolic dysfunction (EF was not recorded). CHF seems to be compensated on admission. -Volume will be managed per renal by HD   DVT ppx: SQ  Heparin  Code Status: Full code Family Communication: None at bed side.  Disposition Plan: Anticipate discharge back to previous SNF environment Consults  called: general surgeon    Consultants:  General Surgery  Hemodialysis services   Antimicrobials: Anti-infectives    Start     Dose/Rate Route Frequency Ordered Stop   11/23/15 0830  metroNIDAZOLE (FLAGYL) IVPB 500 mg     500 mg 100 mL/hr over 60 Minutes Intravenous Every 8 hours 11/23/15 0814     11/23/15 0115  vancomycin (VANCOCIN) 50 mg/mL oral solution 500 mg     500 mg Oral Every 6 hours 11/23/15 0113 12/06/15 2359   11/22/15 2359  metroNIDAZOLE (FLAGYL) IVPB 500 mg  Status:  Discontinued     500 mg 100 mL/hr over 60 Minutes Intravenous Every 8 hours 11/22/15 2311 11/23/15 0113   11/22/15 2359  ciprofloxacin (CIPRO) IVPB 400 mg  Status:  Discontinued    Comments:  Cipro 400 mg IV q24h for CrCl < 30 mL/min   400 mg 200 mL/hr over 60 Minutes Intravenous Daily at bedtime 11/22/15 2332 11/23/15 0113   11/22/15 2200  piperacillin-tazobactam (ZOSYN) IVPB 2.25 g  Status:  Discontinued     2.25 g 100 mL/hr over 30 Minutes Intravenous Every 8 hours 11/22/15 1631 11/22/15 2310   11/22/15 1630  piperacillin-tazobactam (ZOSYN) IVPB 3.375 g  Status:  Discontinued     3.375 g 100 mL/hr over 30 Minutes Intravenous  Once 11/22/15 1616 11/22/15 2200      Subjective: Pt says he is not going to do dialysis today but would do tomorrow.  Also still having RLQ abdominal pain.  Objective: Filed Vitals:   11/23/15 0430 11/23/15 0436 11/23/15 0601 11/23/15 0937  BP: 116/82 116/82 142/48 132/51  Pulse: 99 99 65 101  Temp:  100 F (37.8 C) 98.9 F (37.2 C) 99.7 F (37.6 C)  TempSrc:   Oral Oral  Resp: 21 16 20    Height:   5\' 5"  (1.651 m)   Weight:   155 lb 4.8 oz (70.444 kg)   SpO2: 93% 97% 96% 95%    Intake/Output Summary (Last 24 hours) at 11/23/15 1004 Last data filed at 11/23/15 0900  Gross per 24 hour  Intake   1856 ml  Output     12 ml  Net   1844 ml   Filed Weights   11/23/15 0601  Weight: 155 lb 4.8 oz (70.444 kg)   Exam:  General exam: awake, alert, no distress,  oriented, chronically ill appearing.  Respiratory system: BBS clear. No increased work of breathing. Cardiovascular system: S1 & S2 heard, RRR. No JVD, murmurs, gallops, clicks or pedal edema. Gastrointestinal system: Abdomen is nondistended, soft and pronounced RLQ and epigastric tenderness with guarding. Normal bowel sounds heard. Central nervous system: Alert and oriented. No focal neurological deficits. Extremities: No pitting leg edema bilaterally. 2+DP/PT pulse on the right leg. S/p of right AKA  Data Reviewed: Basic Metabolic Panel:  Recent Labs Lab 11/22/15 1955 11/23/15 0220  NA 136 135  K 3.5 3.4*  CL 99* 102  CO2 23 20*  GLUCOSE 107* 104*  BUN 35* 37*  CREATININE 8.69* 8.52*  CALCIUM 8.0* 7.4*   Liver Function Tests:  Recent Labs Lab 11/22/15 1955  AST 19  ALT 10*  ALKPHOS 144*  BILITOT 1.7*  PROT 7.2  ALBUMIN 3.1*    Recent Labs Lab 11/22/15 1955  LIPASE 34   No results for input(s): AMMONIA in the last 168 hours.  CBC:  Recent Labs Lab 11/22/15 1955 11/23/15 0220  WBC 9.5 8.2  NEUTROABS 7.5  --   HGB 10.9* 9.2*  HCT 34.9* 29.3*  MCV 83.5 82.5  PLT 119* 108*   Cardiac Enzymes: No results for input(s): CKTOTAL, CKMB, CKMBINDEX, TROPONINI in the last 168 hours. BNP (last 3 results) No results for input(s): PROBNP in the last 8760 hours. CBG:  Recent Labs Lab 11/23/15 0022 11/23/15 0556 11/23/15 0733  GLUCAP 104* 94 90    Recent Results (from the past 240 hour(s))  C difficile quick scan w PCR reflex     Status: Abnormal   Collection Time: 11/22/15 11:27 PM  Result Value Ref Range Status   C Diff antigen POSITIVE (A) NEGATIVE Final   C Diff toxin POSITIVE (A) NEGATIVE Final   C Diff interpretation Positive for toxigenic C. difficile  Final    Comment: CRITICAL RESULT CALLED TO, READ BACK BY AND VERIFIED WITH: B OLDAND,RN @0100  11/23/15 MKELLY   MRSA PCR Screening     Status: None   Collection Time: 11/23/15  6:22 AM  Result  Value Ref Range Status   MRSA by PCR NEGATIVE NEGATIVE Final    Comment:        The GeneXpert MRSA Assay (FDA approved for NASAL specimens only), is one component of a comprehensive MRSA colonization surveillance program. It is not intended to diagnose MRSA infection nor to guide or monitor treatment for MRSA infections.      Studies: Ct Abdomen Pelvis Wo Contrast  11/22/2015  CLINICAL DATA:  Abdominal pain beginning over the last 2 days. No vomiting. Recent diagnosis of abdominal blockage. EXAM: CT ABDOMEN AND PELVIS WITHOUT CONTRAST TECHNIQUE: Multidetector CT imaging of the abdomen and pelvis was performed following the standard protocol without IV contrast. COMPARISON:  CTs, 11/07/2015 and 05/03/2014. FINDINGS: Lung bases: Heart is mildly enlarged. There are dense coronary artery calcifications. There is patchy and linear lung base opacity most evident in the left lower lobe, likely atelectasis. Pneumonia is possible. No pulmonary edema. No pleural effusion. Hepatobiliary: Liver is unremarkable. Gallbladder is distended. There are dependent stones but no convincing wall thickening or pericholecystic fluid. No bile duct dilation. Spleen: Mild enlargement measuring 16.5 x 6.7 x 15.4 cm. No splenic mass or focal lesion. Stable. Pancreas: Pancreas is thickened, particularly the pancreatic head, where it surrounds the celiac axis and superior mesenteric artery vasculature. Lower attenuation areas in the pancreatic tail suggest poorly defined cysts or an irregular dilated duct. Margins of the pancreas are ill-defined. This appearance is similar to the prior exam. Additional soft tissue attenuation extends along the gastrohepatic ligament consistent with poorly defined adenopathy. Largest presumed node measures 16 mm in short axis. Adrenal glands:  No masses. Kidneys, ureters, bladder: Marked renal parenchymal thinning. Numerous bilateral renal cysts. No hydronephrosis. Ureters normal course and in  caliber. Bladder is decompressed. Prostate gland:  Mild to moderately enlarged but stable. Ascites:  None. Gastrointestinal: Mild wall thickening suggested along the descending and sigmoid colon extending to the rectum. There is no bowel dilation to suggest obstruction. There are changes from a previous partial right colectomy, stable. Vascular: There are extensive atherosclerotic calcifications throughout the aorta and branch vessels. Musculoskeletal: Large subchondral cysts are noted along the superior acetabula. There is increased density in the bony structures consistent with renal osteodystrophy. There multiple Schmorl's nodes along the spine. Abdominal wall: Small fat containing left para midline abdominal wall hernia just below the umbilicus. IMPRESSION: 1. Abnormal appearance of the pancreas.  An infiltrating pancreatic neoplasm is suspected. This could be better defined with pancreatic MRI with and without contrast if the patient can tolerate that procedure. There is evidence of gastrohepatic ligament chain adenopathy, extending along the porta hepatis. 2. Mild wall thickening of the descending colon, sigmoid colon and rectum consistent with a mild infectious or inflammatory colitis. No evidence of bowel obstruction. 3. Stable splenomegaly. 4. Distended gallbladder with dependent gallstones but no convincing acute cholecystitis. 5. Significant renal parenchymal thinning with multiple cysts with acquired renal cystic disease. 6. Increased opacity the base of the left lower lobe, most likely atelectasis. Consider pneumonia in the proper clinical setting. Electronically Signed   By: Lajean Manes M.D.   On: 11/22/2015 19:46   Dg Chest Port 1 View  11/22/2015  CLINICAL DATA:  Patient comes from [facility] for post CVA post MI. Patient started having Diarrhea since Friday. Patient started having Abd pain and fever 103.1 . Hx of diabetes, stroke, HTN. EXAM: PORTABLE CHEST 1 VIEW COMPARISON:  05/03/2014  FINDINGS: Nasogastric tube has been removed. Heart is mildly enlarged. Film is made with shallow lung inflation. There is linear density and adjacent lucency at the right lung base, likely related to linear atelectasis. However it is difficult to exclude free intraperitoneal air. There is focal atelectasis or early infiltrate at the left lung base. Mild pulmonary edema is noted. IMPRESSION: 1. Question of free intraperitoneal air beneath the right hemidiaphragm. Further evaluation with left lateral decubitus view of the abdomen or CT of the abdomen and pelvis is recommended. 2. Bibasilar atelectasis or infiltrates, left greater than right. 3. Mild interstitial pulmonary edema. Electronically Signed   By: Nolon Nations M.D.   On: 11/22/2015 17:40   Dg Abd Portable 2v  11/22/2015  CLINICAL DATA:  Diarrhea since Friday with history previous small-bowel obstruction. EXAM: PORTABLE ABDOMEN - 2 VIEW COMPARISON:  X-ray 11/08/2015.  CT scan 10/30/2015. FINDINGS: Right-side-up decubitus film shows no evidence for intraperitoneal free air. Diffuse gaseous small bowel distention is noted. There is some high attenuation which appears to be in small bowel loops over the central abdomen, nonspecific. Given the central clustering of small bowel , ventral hernia would be a consideration. Bones are diffusely demineralized. , IMPRESSION: Diffuse gaseous bowel distention without evidence for intraperitoneal free air. Ileus or small-bowel obstruction could have this appearance. Central clustering of small bowel raises the question for bowel herniation into a ventral hernia which was seen on the previous CT scan. Electronically Signed   By: Misty Stanley M.D.   On: 11/22/2015 19:03     Scheduled Meds: . aspirin  81 mg Oral Daily  . atorvastatin  20 mg Oral QHS  . B-complex with vitamin C  1 tablet Oral QHS  . feeding supplement (PRO-STAT SUGAR FREE 64)  30 mL Oral Daily  . gabapentin  100 mg Oral QHS  . heparin  5,000  Units Subcutaneous Q8H  . insulin aspart  0-5 Units Subcutaneous QHS  . insulin aspart  0-9 Units Subcutaneous TID WC  . metronidazole  500 mg Intravenous Q8H  . midodrine  5 mg Oral 3 times per day on Sun Tue Thu Sat   And  . [START ON 11/24/2015] midodrine  5 mg Oral 2 times per day on Mon Wed Fri  . sevelamer carbonate  800 mg Oral BID AC  . sodium chloride flush  3 mL Intravenous Q12H  . vancomycin  500 mg Oral Q6H   Continuous Infusions:   Principal  Problem:   C. difficile colitis Active Problems:   Anxiety state   End stage renal disease on dialysis ([place])   CAD (coronary artery disease), native coronary artery   Secondary hyperparathyroidism ([facility])   Depression   Dyslipidemia   S/P AKA (above knee amputation) unilateral ([facility])   Cholelithiasis   Ischemic colitis ([place])   Type 2 diabetes mellitus with chronic kidney disease on chronic dialysis, without long-term current use of insulin ([facility])   PAD (peripheral artery disease) ([place])   Gallstone pancreatitis   Chronic systolic CHF (congestive heart failure) ([place])   Diarrhea   Sepsis ([place])  Time spent:   Irwin Brakeman, MD, FAAFP [facility] Pager [phone]  If 7PM-7AM, please contact night-coverage [email or web address] Password [email or web address] 11/23/2015, 10:04 AM    LOS: 1 day

## 2015-11-23 NOTE — ED Notes (Signed)
Dr.Niu aware of positive c-diff

## 2015-11-23 NOTE — ED Notes (Signed)
Dr.Niu paged and made aware of elevated lactic

## 2015-11-23 NOTE — Evaluation (Signed)
Physical Therapy Evaluation Patient Details Name: Sean [name] MRN: [id number] DOB: 02/01/1936 Today's Date: 11/23/2015   History of Present Illness  HPI: Sean [name] is a 80 y.o. male with medical history significant of ischemic colitis s/p bowel resection, cholelithiasis, gallstone pancreatitis, diet controlled DM2, ESRD on Hd (MWF), caciphylaxis, s/p Left AKA, hypotension, HLD, anemia, depression with anxiety; who presents with diarrhea and abdominal pain and fever. positive CDiff colitis  Clinical Impression   Pt admitted with above diagnosis. Pt currently with functional limitations due to the deficits listed below (see PT Problem List).  Pt will benefit from skilled PT to increase their independence and safety with mobility to allow discharge to the venue listed below.       Follow Up Recommendations SNF    Equipment Recommendations  None recommended by PT    Recommendations for Other Services       Precautions / Restrictions Precautions Precautions: Fall Restrictions Weight Bearing Restrictions: No      Mobility  Bed Mobility Overal bed mobility: Needs Assistance;+ 2 for safety/equipment Bed Mobility: Rolling;Sidelying to Sit Rolling: Min assist Sidelying to sit: Mod assist       General bed mobility comments: Assistance for management of BLEs, use of bed rails needed to power up into sitting- HOB flat  Transfers Overall transfer level: Needs assistance Equipment used: Rolling walker (2 wheeled) Transfers: Sit to/from Stand Sit to Stand: +2 safety/equipment;Min assist Stand pivot transfers: Min guard       General transfer comment: Pt specifically requested to not be assisted to power up; stood to RW with L prosthesis on; heavy dependence on UE push up; min assist to steady RW and second persin for safety  Ambulation/Gait Ambulation/Gait assistance: +2 safety/equipment;Mod assist Ambulation Distance (Feet):  (2 steps forward) Assistive device:  Rolling walker (2 wheeled) (and L prosthesis)       General Gait Details: Paulita Cradle to take 2 steps forward with RW; then began stooling, and needed to sit; difficulty taking steps backwards, had to unlock bed and roll it closer to pt to sit  Stairs            Wheelchair Mobility    Modified Rankin (Stroke Patients Only)       Balance Overall balance assessment: Needs assistance Sitting-balance support: No upper extremity supported;Feet supported Sitting balance-Leahy Scale: Good Sitting balance - Comments: Pt donning prosthetic seated EOB performing lateral leans to correctly place the socks   Standing balance support: Bilateral upper extremity supported Standing balance-Leahy Scale: Poor                               Pertinent Vitals/Pain Pain Assessment: 0-10 Pain Score: 5  Pain Location: abdominal soreness Pain Descriptors / Indicators: Discomfort Pain Intervention(s): Limited activity within patient's tolerance;Monitored during session    Home Living Family/patient expects to be discharged to:: Skilled nursing facility                      Prior Function Level of Independence: Needs assistance   Gait / Transfers Assistance Needed: Has been working with PT at [facility] on walking with prosthesis  ADL's / Homemaking Assistance Needed: Bath Aide occasionally at [place]: Right    Extremity/Trunk Assessment   Upper Extremity Assessment: Defer to OT evaluation RUE Deficits / Details: decreased dexterity/fine & gross motor coordination, decreased  overall functional strength     LUE Deficits / Details: decreased dexterity/fine & gross motor coordination, decreased overall functional strength   Lower Extremity Assessment: Generalized weakness;LLE deficits/detail   LLE Deficits / Details: AKA; reports he still experiences phantom sensations  Cervical / Trunk Assessment: Normal  Communication    Communication: HOH;No difficulties  Cognition Arousal/Alertness: Awake/alert Behavior During Therapy: WFL for tasks assessed/performed Overall Cognitive Status: Within Functional Limits for tasks assessed                      General Comments      Exercises        Assessment/Plan    PT Assessment Patient needs continued PT services  PT Diagnosis Generalized weakness;Difficulty walking   PT Problem List Decreased activity tolerance;Decreased balance;Decreased mobility;Decreased knowledge of use of DME  PT Treatment Interventions DME instruction;Gait training;Functional mobility training;Therapeutic activities;Therapeutic exercise;Patient/family education;Balance training   PT Goals (Current goals can be found in the Care Plan section) Acute Rehab PT Goals Patient Stated Goal: [name] long term goal is to get to an Assisted Living facility PT Goal Formulation: With patient Time For Goal Achievement: 12/07/15 Potential to Achieve Goals: Good    Frequency Min 3X/week   Barriers to discharge        Co-evaluation PT/OT/SLP Co-Evaluation/Treatment: Yes Reason for Co-Treatment: Complexity of the patient's impairments (multi-system involvement);For patient/therapist safety PT goals addressed during session: Mobility/safety with mobility OT goals addressed during session: ADL's and self-care;Other (comment) (functional mobility)       End of Session Equipment Utilized During Treatment:  (prosthesis) Activity Tolerance: Patient tolerated treatment well Patient left: in bed;with call bell/phone within reach;with bed alarm set (sitting EOB for lunch) Nurse Communication: Mobility status    Functional Assessment Tool Used: clinical Judgement Functional Limitation: Mobility: Walking and moving around Mobility: Walking and Moving Around Current Status [id number]): At least 40 percent but less than 60 percent impaired, limited or restricted Mobility: Walking and Moving  Around Goal Status [phone]): At least 1 percent but less than 20 percent impaired, limited or restricted    Time: 1218-1256 PT Time Calculation (min) (ACUTE ONLY): 38 min   Charges:   PT Evaluation $PT Eval Moderate Complexity: 1 Procedure     PT G Codes:   PT G-Codes **NOT FOR INPATIENT CLASS** Functional Assessment Tool Used: clinical Judgement Functional Limitation: Mobility: Walking and moving around Mobility: Walking and Moving Around Current Status [id number]): At least 40 percent but less than 60 percent impaired, limited or restricted Mobility: Walking and Moving Around Goal Status [phone]): At least 1 percent but less than 20 percent impaired, limited or restricted    Roney Marion [facility] 11/23/2015, 2:03 PM  Roney Marion, [place] Pager [phone] Office [phone]

## 2015-11-23 NOTE — Progress Notes (Signed)
Patient ID: Sean [name], male   DOB: 12-24-1935, 80 y.o.   MRN: [id number]   LOS: 1 day   Subjective: Sean [name] is well-known to the surgical service. He was hospitalized last month with GS pancreatitis and discharged to SNF with plans to do an elective delayed cholecystectomy. He returned to the hospital yesterday with diarrhea/nausea/abd pain and was found to have C diff colitis. He feels better today but is still very weak.   Objective: Vital signs in last 24 hours: Temp:  [98.6 F (37 C)-100.9 F (38.3 C)] 99.7 F (37.6 C) (06/13 0937) Pulse Rate:  [33-103] 101 (06/13 0937) Resp:  [14-28] 20 (06/13 0601) BP: (101-147)/(40-82) 132/51 mmHg (06/13 0937) SpO2:  [90 %-100 %] 95 % (06/13 0937) Weight:  [70.444 kg (155 lb 4.8 oz)] 70.444 kg (155 lb 4.8 oz) (06/13 0601) Last BM Date: 11/23/15   Laboratory  CBC  Recent Labs  11/22/15 1955 11/23/15 0220  WBC 9.5 8.2  HGB 10.9* 9.2*  HCT 34.9* 29.3*  PLT 119* 108*   BMET  Recent Labs  11/22/15 1955 11/23/15 0220  NA 136 135  K 3.5 3.4*  CL 99* 102  CO2 23 20*  GLUCOSE 107* 104*  BUN 35* 37*  CREATININE 8.69* 8.52*  CALCIUM 8.0* 7.4*   Lipase     Component Value Date/Time   LIPASE 34 11/22/2015 1955    Physical Exam General appearance: alert and no distress Resp: clear to auscultation bilaterally Cardio: regular rate and rhythm GI: Soft, +BS, mild diffuse TTP   Assessment/Plan: Cholelithiasis -- Would defer cholecystectomy until acute illness is resolved. Removal this admission doubtful but will follow along and assess for appropriateness. No indication pancreatitis has returned. C diff colitis -- Treatment per primary team, on high-dose oral vanc, symptoms improving Multiple medical problems -- per primary team    Lisette Abu, PA-C Pager: [phone] 11/23/2015

## 2015-11-24 LAB — BASIC METABOLIC PANEL
Anion gap: 12 (ref 5–15)
BUN: 52 mg/dL — AB (ref 6–20)
CALCIUM: 7.9 mg/dL — AB (ref 8.9–10.3)
CO2: 20 mmol/L — AB (ref 22–32)
Chloride: 101 mmol/L (ref 101–111)
Creatinine, Ser: 10.41 mg/dL — ABNORMAL HIGH (ref 0.61–1.24)
GFR calc Af Amer: 5 mL/min — ABNORMAL LOW (ref >60)
GFR, EST NON AFRICAN AMERICAN: 4 mL/min — AB (ref >60)
GLUCOSE: 135 mg/dL — AB (ref 65–99)
POTASSIUM: 3.3 mmol/L — AB (ref 3.5–5.1)
Sodium: 133 mmol/L — ABNORMAL LOW (ref 135–145)

## 2015-11-24 LAB — GLUCOSE, CAPILLARY
GLUCOSE-CAPILLARY: 130 mg/dL — AB (ref 65–99)
Glucose-Capillary: 110 mg/dL — ABNORMAL HIGH (ref 65–99)
Glucose-Capillary: 140 mg/dL — ABNORMAL HIGH (ref 65–99)

## 2015-11-24 LAB — CBC
HEMATOCRIT: 31.5 % — AB (ref 39.0–52.0)
HEMOGLOBIN: 9.9 g/dL — AB (ref 13.0–17.0)
MCH: 25.3 pg — AB (ref 26.0–34.0)
MCHC: 31.4 g/dL (ref 30.0–36.0)
MCV: 80.6 fL (ref 78.0–100.0)
Platelets: 192 10*3/uL (ref 150–400)
RBC: 3.91 MIL/uL — ABNORMAL LOW (ref 4.22–5.81)
RDW: 16.9 % — AB (ref 11.5–15.5)
WBC: 12 10*3/uL — ABNORMAL HIGH (ref 4.0–10.5)

## 2015-11-24 MED ORDER — LIDOCAINE HCL (PF) 1 % IJ SOLN: 5.0000 mL | INTRAMUSCULAR | Status: DC | PRN

## 2015-11-24 MED ORDER — SODIUM CHLORIDE 0.9 % IV SOLN: 100.0000 mL | INTRAVENOUS | Status: DC | PRN

## 2015-11-24 MED ORDER — HYDROCODONE-ACETAMINOPHEN 5-325 MG PO TABS
ORAL_TABLET | ORAL | Status: AC
  Administered 2015-11-24: 1
  Filled 2015-11-24: qty 1

## 2015-11-24 MED ORDER — HEPARIN SODIUM (PORCINE) 1000 UNIT/ML DIALYSIS: 1000.0000 [IU] | INTRAMUSCULAR | Status: DC | PRN

## 2015-11-24 MED ORDER — ALUM & MAG HYDROXIDE-SIMETH 200-200-20 MG/5ML PO SUSP
30.0000 mL | ORAL | Status: DC | PRN
  Administered 2015-11-24 – 2015-11-28 (×3): 30 mL via ORAL
  Filled 2015-11-24 (×3): qty 30

## 2015-11-24 MED ORDER — LIDOCAINE-PRILOCAINE 2.5-2.5 % EX CREA: 1.0000 "application " | TOPICAL_CREAM | CUTANEOUS | Status: DC | PRN

## 2015-11-24 MED ORDER — PENTAFLUOROPROP-TETRAFLUOROETH EX AERO: 1.0000 "application " | INHALATION_SPRAY | CUTANEOUS | Status: DC | PRN

## 2015-11-24 MED ORDER — ALTEPLASE 2 MG IJ SOLR: 2.0000 mg | Freq: Once | INTRAMUSCULAR | Status: DC | PRN

## 2015-11-24 MED ORDER — HEPARIN SODIUM (PORCINE) 1000 UNIT/ML DIALYSIS
5000.0000 [IU] | Freq: Once | INTRAMUSCULAR | Status: DC
  Filled 2015-11-24: qty 5

## 2015-11-24 NOTE — Consult Note (Signed)
Indication for Consultation:  Management of ESRD/hemodialysis; anemia, hypertension/volume and secondary hyperparathyroidism PCP:  HPI: Sean [name] is a 80 y.o. male with ESRD on hemodialysis MWF at [facility]. PMH significant for hypertension, DM, recent admission for gallstone pancreatitis, cholelithiasis. CAD, chronic combined systolic/diastolic HF EF [id number] calciphylaxis, CVA, dementia, anemia of chronic disease, SHPT.  L AKA. History of parathyroidectomy, colon resection 2011.   Patient was admitted for observation 11/22/15 for C/O fever, chills, abdominal pain and diarrhea. Patient was found to be positive for c diff by PCR, started on flagyl and vanc per primary. CT of pelvis/abdomin showed Abnormal appearance of the pancreas. An infiltrating pancreatic Neoplasm suspected. Distended gallbladder with dependent gallstones but no convincing acute cholecystitis. General surgery has been consulted. MRI has been scheduled.   Patient refused HD Monday, said he felt too bad. States that he continues to feel weak, having liquid stools and "cramping, grumbling feeling in my stomach". Poor appetite. Denies chest pain, SOB, denies nausea, vomiting, + diarrhea + malaise + abdominal pain. Denies melena, hematochezia, hemoptysis, no flank pain. Denies syncope, dizziness, headache, vision changes, hearing changes.  Patient has been on HD since 2003. Is compliant with HD prescription.   Past Medical History  Diagnosis Date  . Hyperparathyroidism   . Arthritis   . Hyperlipidemia   . Hypertension   . Stroke ([place])   . GERD (gastroesophageal reflux disease)   . Migraine   . Heart attack ([place]) 2011; 1990's  . Peripheral neuropathy ([place])   . Depression with anxiety   . Dialysis patient [facility])     M, W, F; [place] CAD (coronary artery disease)     a. s/p NSTEMI and failed RCA PCI in 2011. b. Nuc 12/2013: nonischemic, prior inferior MI.  Marland Kitchen LBBB (left bundle branch  block)   . Calciphylaxis     a. Multiple admissions for nonhealing L ulcer/sepsis, s/p L AKA 02/02/2014.  Marland Kitchen Chronic anemia   . Dementia   . ESRD (end stage renal disease) on dialysis ([place])   . Renal insufficiency   . Hypotension     renovascular  . History of left bundle branch block   . Type II diabetes mellitus ([place])     not taking any medications.  . Cholelithiasis 01/2014    acute cholecystitis managed with perc drain due to high surgical risk.    Past Surgical History  Procedure Laterality Date  . Parathyroidectomy  2011  . Capd insertion  09/12/2011    Procedure: LAPAROSCOPIC INSERTION CONTINUOUS AMBULATORY PERITONEAL DIALYSIS  (CAPD) CATHETER;  Surgeon: Edward Jolly, MD;  Location: [place];  Service: General;  Laterality: N/A;  . Sp av dialysis shunt access existing *l*  2003  . Dg av dialysis shunt intro ndl*r* or      "not working" (09/19/11)  . Capd removal  03/12/2012    Procedure: CONTINUOUS AMBULATORY PERITONEAL DIALYSIS  (CAPD) CATHETER REMOVAL;  Surgeon: Edward Jolly, MD;  Location: [place];  Service: General;  Laterality: N/A;  . Amputation Left 02/02/2014    Procedure: AMPUTATION ABOVE KNEE-LEFT;  Surgeon: Elam Dutch, MD;  Location: [place];  Service: Vascular;  Laterality: Left;  . I&d extremity Left 03/31/2014    Procedure: IRRIGATION AND DEBRIDEMENT EXTREMITY - LEFT ABOVE KNEE AMPUTATION;  Surgeon: Elam Dutch, MD;  Location: [place];  Service: Vascular;  Laterality: Left;  . Application of wound vac Left 03/31/2014    Procedure: APPLICATION OF WOUND VAC -  LEFT AKA;  Surgeon: Elam Dutch, MD;  Location: [place];  Service: Vascular;  Laterality: Left;  . Colon surgery  2011    colon resection   . Colonoscopy with propofol N/A 07/14/2014    Procedure: COLONOSCOPY WITH PROPOFOL;  Surgeon: Jerene Bears, MD;  Location: [facility];  Service: Gastroenterology;  Laterality: N/A;   Family History  Problem Relation Age of Onset  . Cancer Sister     breast   . Cancer Brother     lung  . Anesthesia problems Neg Hx   . Hypotension Neg Hx   . Malignant hyperthermia Neg Hx   . Pseudochol deficiency Neg Hx    Social History:  reports that he quit smoking about 39 years ago. His smoking use included Cigarettes. He has a 1.75 pack-year smoking history. He has never used smokeless tobacco. He reports that he does not drink alcohol or use illicit drugs. Allergies  Allergen Reactions  . Benazepril Hcl Hives, Itching and Rash    flareups on patient head   Prior to Admission medications   Medication Sig Start Date End Date Taking? Authorizing Provider  acetaminophen (TYLENOL) 325 MG tablet Take 650 mg by mouth every 4 (four) hours as needed for mild pain, fever or headache (DO NOT EXCEED 4 GM OF APAP IN 24 HOURS FORM ALL SOURCES).    Yes Historical Provider, MD  Amino Acids-Protein Hydrolys (FEEDING SUPPLEMENT, PRO-STAT SUGAR FREE 64,) LIQD Take 30 mLs by mouth daily.   Yes Historical Provider, MD  aspirin 81 MG chewable tablet Chew 81 mg by mouth daily.    Yes Historical Provider, MD  atorvastatin (LIPITOR) 20 MG tablet Take 20 mg by mouth at bedtime.    Yes Historical Provider, MD  b-complex w/C (TOTAL B/C) TABS tablet Take 1 tablet by mouth at bedtime.   Yes Historical Provider, MD  docusate sodium (COLACE) 100 MG capsule Take 100 mg by mouth daily.   Yes Historical Provider, MD  gabapentin (NEURONTIN) 100 MG capsule Take 100 mg by mouth at bedtime.    Yes Historical Provider, MD  loperamide (IMODIUM) 2 MG capsule Take 2-4 mg by mouth See admin instructions. 4 mg (as needed) initially followed by 2 mg after each loose stool   Yes Historical Provider, MD  midodrine (PROAMATINE) 5 MG tablet Take 5 mg by mouth 3 (three) times daily. Do not take 6 am dose on M- W- F- d/t  dialysis days. 02/01/15  Yes Historical Provider, MD  ondansetron (ZOFRAN) 4 MG tablet Take 4 mg by mouth every 6 (six) hours as needed for nausea.   Yes Historical Provider, MD   sevelamer carbonate (RENVELA) 800 MG tablet Take 800 mg by mouth daily with lunch. 12 pm and 5 pm.   Yes Historical Provider, MD  sorbitol 70 % solution Take 30 mLs by mouth daily as needed (severe constipation).   Yes Historical Provider, MD  HYDROcodone-acetaminophen (NORCO) 10-325 MG tablet Take one tablet by mouth every 8 hours as needed for moderate pain. Do not exceed 4gm of Tylenol in 24 hours Patient taking differently: Take one tablet by mouth every 8 hours as needed for moderate pain. Do not exceed 4gm of Tylenol from all sources in 24 hours 11/11/15   Gayland Curry, DO   Current Facility-Administered Medications  Medication Dose Route Frequency Provider Last Rate Last Dose  . acetaminophen (TYLENOL) tablet 650 mg  650 mg Oral Q4H PRN Ivor Costa, MD   650 mg at  11/23/15 2114  . alum & mag hydroxide-simeth (MAALOX/MYLANTA) 200-200-20 MG/5ML suspension 30 mL  30 mL Oral Q4H PRN Nishant Dhungel, MD   30 mL at 11/24/15 0902  . aspirin chewable tablet 81 mg  81 mg Oral Daily Ivor Costa, MD   81 mg at 11/24/15 0859  . atorvastatin (LIPITOR) tablet 20 mg  20 mg Oral QHS Ivor Costa, MD   20 mg at 11/23/15 2112  . B-complex with vitamin C tablet 1 tablet  1 tablet Oral QHS Ivor Costa, MD   1 tablet at 11/23/15 2112  . feeding supplement (PRO-STAT SUGAR FREE 64) liquid 30 mL  30 mL Oral Daily Ivor Costa, MD   30 mL at 11/24/15 0859  . gabapentin (NEURONTIN) capsule 100 mg  100 mg Oral QHS Ivor Costa, MD   100 mg at 11/23/15 2112  . heparin injection 5,000 Units  5,000 Units Subcutaneous Q8H Ivor Costa, MD   5,000 Units at 11/24/15 0556  . HYDROcodone-acetaminophen (NORCO) 10-325 MG per tablet 1 tablet  1 tablet Oral Q4H PRN Ivor Costa, MD   1 tablet at 11/23/15 2112  . hydrOXYzine (VISTARIL) injection 25 mg  25 mg Intramuscular Q6H PRN Ivor Costa, MD      . insulin aspart (novoLOG) injection 0-5 Units  0-5 Units Subcutaneous QHS Ivor Costa, MD   0 Units at 11/23/15 0023  . insulin aspart (novoLOG) injection  0-9 Units  0-9 Units Subcutaneous TID WC Ivor Costa, MD   2 Units at 11/23/15 1725  . metroNIDAZOLE (FLAGYL) IVPB 500 mg  500 mg Intravenous Q8H [place] Marisa Hua, MD   500 mg at 11/24/15 0556  . midodrine (PROAMATINE) tablet 5 mg  5 mg Oral 3 times per day on Sun Tue Thu Sat Ivor Costa, MD   5 mg at 11/23/15 1724   And  . midodrine (PROAMATINE) tablet 5 mg  5 mg Oral 2 times per day on Mon Wed Fri Ivor Costa, MD      . morphine 2 MG/ML injection 2 mg  2 mg Intravenous Q4H PRN Ivor Costa, MD   2 mg at 11/23/15 0423  . sevelamer carbonate (RENVELA) tablet 800 mg  800 mg Oral BID AC Ivor Costa, MD   800 mg at 11/23/15 1724  . sodium chloride flush (NS) 0.9 % injection 3 mL  3 mL Intravenous Q12H Ivor Costa, MD   3 mL at 11/24/15 1000  . vancomycin (VANCOCIN) 50 mg/mL oral solution 500 mg  500 mg Oral Q6H Ivor Costa, MD   500 mg at 11/24/15 0556   Labs: Basic Metabolic Panel:  Recent Labs Lab 11/22/15 1955 11/23/15 0220  NA 136 135  K 3.5 3.4*  CL 99* 102  CO2 23 20*  GLUCOSE 107* 104*  BUN 35* 37*  CREATININE 8.69* 8.52*  CALCIUM 8.0* 7.4*   Liver Function Tests:  Recent Labs Lab 11/22/15 1955  AST 19  ALT 10*  ALKPHOS 144*  BILITOT 1.7*  PROT 7.2  ALBUMIN 3.1*    Recent Labs Lab 11/22/15 1955  LIPASE 34    CBC:  Recent Labs Lab 11/22/15 1955 11/23/15 0220  WBC 9.5 8.2  NEUTROABS 7.5  --   HGB 10.9* 9.2*  HCT 34.9* 29.3*  MCV 83.5 82.5  PLT 119* 108*   CBG:  Recent Labs Lab 11/23/15 0733 11/23/15 1220 11/23/15 1551 11/23/15 2045 11/24/15 0814  GLUCAP 90 153* 175* 133* 110*   Studies/Results: Ct Abdomen Pelvis Wo Contrast  11/22/2015  CLINICAL DATA:  Abdominal pain beginning over the last 2 days. No vomiting. Recent diagnosis of abdominal blockage. EXAM: CT ABDOMEN AND PELVIS WITHOUT CONTRAST TECHNIQUE: Multidetector CT imaging of the abdomen and pelvis was performed following the standard protocol without IV contrast. COMPARISON:  CTs, 11/07/2015 and  05/03/2014. FINDINGS: Lung bases: Heart is mildly enlarged. There are dense coronary artery calcifications. There is patchy and linear lung base opacity most evident in the left lower lobe, likely atelectasis. Pneumonia is possible. No pulmonary edema. No pleural effusion. Hepatobiliary: Liver is unremarkable. Gallbladder is distended. There are dependent stones but no convincing wall thickening or pericholecystic fluid. No bile duct dilation. Spleen: Mild enlargement measuring 16.5 x 6.7 x 15.4 cm. No splenic mass or focal lesion. Stable. Pancreas: Pancreas is thickened, particularly the pancreatic head, where it surrounds the celiac axis and superior mesenteric artery vasculature. Lower attenuation areas in the pancreatic tail suggest poorly defined cysts or an irregular dilated duct. Margins of the pancreas are ill-defined. This appearance is similar to the prior exam. Additional soft tissue attenuation extends along the gastrohepatic ligament consistent with poorly defined adenopathy. Largest presumed node measures 16 mm in short axis. Adrenal glands:  No masses. Kidneys, ureters, bladder: Marked renal parenchymal thinning. Numerous bilateral renal cysts. No hydronephrosis. Ureters normal course and in caliber. Bladder is decompressed. Prostate gland:  Mild to moderately enlarged but stable. Ascites:  None. Gastrointestinal: Mild wall thickening suggested along the descending and sigmoid colon extending to the rectum. There is no bowel dilation to suggest obstruction. There are changes from a previous partial right colectomy, stable. Vascular: There are extensive atherosclerotic calcifications throughout the aorta and branch vessels. Musculoskeletal: Large subchondral cysts are noted along the superior acetabula. There is increased density in the bony structures consistent with renal osteodystrophy. There multiple Schmorl's nodes along the spine. Abdominal wall: Small fat containing left para midline abdominal  wall hernia just below the umbilicus. IMPRESSION: 1. Abnormal appearance of the pancreas. An infiltrating pancreatic neoplasm is suspected. This could be better defined with pancreatic MRI with and without contrast if the patient can tolerate that procedure. There is evidence of gastrohepatic ligament chain adenopathy, extending along the porta hepatis. 2. Mild wall thickening of the descending colon, sigmoid colon and rectum consistent with a mild infectious or inflammatory colitis. No evidence of bowel obstruction. 3. Stable splenomegaly. 4. Distended gallbladder with dependent gallstones but no convincing acute cholecystitis. 5. Significant renal parenchymal thinning with multiple cysts with acquired renal cystic disease. 6. Increased opacity the base of the left lower lobe, most likely atelectasis. Consider pneumonia in the proper clinical setting. Electronically Signed   By: Lajean Manes M.D.   On: 11/22/2015 19:46   Dg Chest Port 1 View  11/22/2015  CLINICAL DATA:  Patient comes from [facility] for post CVA post MI. Patient started having Diarrhea since Friday. Patient started having Abd pain and fever 103.1 . Hx of diabetes, stroke, HTN. EXAM: PORTABLE CHEST 1 VIEW COMPARISON:  05/03/2014 FINDINGS: Nasogastric tube has been removed. Heart is mildly enlarged. Film is made with shallow lung inflation. There is linear density and adjacent lucency at the right lung base, likely related to linear atelectasis. However it is difficult to exclude free intraperitoneal air. There is focal atelectasis or early infiltrate at the left lung base. Mild pulmonary edema is noted. IMPRESSION: 1. Question of free intraperitoneal air beneath the right hemidiaphragm. Further evaluation with left lateral decubitus view of the abdomen or CT of the abdomen and pelvis  is recommended. 2. Bibasilar atelectasis or infiltrates, left greater than right. 3. Mild interstitial pulmonary edema. Electronically Signed   By: Nolon Nations M.D.   On: 11/22/2015 17:40   Dg Abd Portable 2v  11/22/2015  CLINICAL DATA:  Diarrhea since Friday with history previous small-bowel obstruction. EXAM: PORTABLE ABDOMEN - 2 VIEW COMPARISON:  X-ray 11/08/2015.  CT scan 10/30/2015. FINDINGS: Right-side-up decubitus film shows no evidence for intraperitoneal free air. Diffuse gaseous small bowel distention is noted. There is some high attenuation which appears to be in small bowel loops over the central abdomen, nonspecific. Given the central clustering of small bowel , ventral hernia would be a consideration. Bones are diffusely demineralized. , IMPRESSION: Diffuse gaseous bowel distention without evidence for intraperitoneal free air. Ileus or small-bowel obstruction could have this appearance. Central clustering of small bowel raises the question for bowel herniation into a ventral hernia which was seen on the previous CT scan. Electronically Signed   By: Misty Stanley M.D.   On: 11/22/2015 19:03    ROS: As per HPI otherwise negative.   Physical Exam: Filed Vitals:   11/23/15 2053 11/24/15 0255 11/24/15 0631 11/24/15 0700  BP: 127/57  100/56 105/54  Pulse:   81 81  Temp: 100 F (37.8 C)  98.6 F (37 C) 98.4 F (36.9 C)  TempSrc: Oral  Oral Oral  Resp: 18  16 18   Height:      Weight:  72.122 kg (159 lb)    SpO2: 96%  97% 99%     General: Pleasant cooperative in NAD Head: Normocephalic, atraumatic, sclera non-icteric, mucus membranes are moist Neck: Supple. JVD not elevated. Lungs: Clear bilaterally to auscultation without wheezes, rales, or rhonchi. Breathing is unlabored. Heart: RRR with S1 S2. III/VI systolic M.  Abdomen: diffuse tenderness but primary in L and RLQ. Slightly distended, active BS.  M-S:  Strength and tone appear normal for age. Lower extremities: L AKA. No LE edema.   Neuro: Alert and oriented X 3. Moves all extremities spontaneously. Psych:  Responds to questions appropriately with a normal affect. Dialysis  Access:LFA AVG + bruit  Dialysis Orders: [facility] MWF 4 hrs 400/500 EDW 71 kg 2.0K/2.25 Ca UF profile 4 Linear NA L AVG Heparin 5000 units IV q treatment Mircera 75 mcg IV q 4 weeks (last dose 11/12/15 HGB 10.3 11/17/15) Hectorol 3 mcg IV q tx (last PTH 1794 10/07/15)  Assessment/Plan: 1.  C Diff per PCR: On flagyl/vanc per primary. Enteric isolation.  2.  Gallstone pancreatitis: Surgery has been consulted  CT of abd/pelvis  06/12/ 17Abnormal appearance of pancreas-suspected infiltrating neoplasm suspected. For MRI today for further investigation. T bili ^ 1.7 lipase 34.  3.  ESRD -  MWF at [facility]. Refused to come to HD Monday, stated he felt too bad. Agrees to come to HD today.  K+3.4 use 4 k bath 4.  Hypertension/volume  - BP controlled, Requires Midodrine on HD days. Wt 72.2 UFG 1.5-2 liters today.  5.  Anemia  - HGB 9.2 recent ESA dose-follow HGB-may require re-dosing.  6.  Metabolic bone disease -  Last incenter phos 1.8 11/17/15. Stop binders, Cont VDRA 7.  Nutrition - Albumin 3.1 On heart healthy/Carb mod diet. K+ 3.4 Will leave for now. Nepro/renal vit.  8.  DM: Per primary  9.    Chronic systolic CHF: Volume stable per primary   Rita H. Owens Shark, NP-C 11/24/2015, 10:57 AM  [employer] [phone]    Pt seen, examined  and agree w A/P as above.  [name] MD [facility] pager [phone]    cell [phone] 11/24/2015, 4:51 PM

## 2015-11-24 NOTE — Progress Notes (Deleted)
 [facility] Renal Consultation Note    Indication for Consultation:  Management of ESRD/hemodialysis; anemia, hypertension/volume and secondary hyperparathyroidism PCP:  HPI: Sean [name] is a 80 y.o. male with ESRD on hemodialysis MWF at [facility]. PMH significant for hypertension, DM, recent admission for gallstone pancreatitis, cholelithiasis. CAD, chronic combined systolic/diastolic HF EF [id number] calciphylaxis, CVA, dementia, anemia of chronic disease, SHPT.  L AKA. History of parathyroidectomy, colon resection 2011.   Patient was admitted for observation 11/22/15 for C/O fever, chills, abdominal pain and diarrhea. Patient was found to be positive for c diff by PCR, started on flagyl and vanc per primary. CT of pelvis/abdomin showed Abnormal appearance of the pancreas. An infiltrating pancreatic Neoplasm suspected. Distended gallbladder with dependent gallstones but no convincing acute cholecystitis. General surgery has been consulted. MRI has been scheduled.   Patient refused HD Monday, said he felt too bad. States that he continues to feel weak, having liquid stools and "cramping, grumbling feeling in my stomach". Poor appetite. Denies chest pain, SOB, denies nausea, vomiting, + diarrhea + malaise + abdominal pain. Denies melena, hematochezia, hemoptysis, no flank pain. Denies syncope, dizziness, headache, vision changes, hearing changes.  Patient has been on HD since 2003. Is compliant with HD prescription.   Past Medical History  Diagnosis Date  . Hyperparathyroidism   . Arthritis   . Hyperlipidemia   . Hypertension   . Stroke ([place])   . GERD (gastroesophageal reflux disease)   . Migraine   . Heart attack ([place]) 2011; 1990's  . Peripheral neuropathy ([place])   . Depression with anxiety   . Dialysis patient [facility])     M, W, F; [place] CAD (coronary artery disease)     a. s/p NSTEMI and failed RCA PCI in 2011. b. Nuc 12/2013:  nonischemic, prior inferior MI.  Marland Kitchen LBBB (left bundle branch block)   . Calciphylaxis     a. Multiple admissions for nonhealing L ulcer/sepsis, s/p L AKA 02/02/2014.  Marland Kitchen Chronic anemia   . Dementia   . ESRD (end stage renal disease) on dialysis ([place])   . Renal insufficiency   . Hypotension     renovascular  . History of left bundle branch block   . Type II diabetes mellitus ([place])     not taking any medications.  . Cholelithiasis 01/2014    acute cholecystitis managed with perc drain due to high surgical risk.    Past Surgical History  Procedure Laterality Date  . Parathyroidectomy  2011  . Capd insertion  09/12/2011    Procedure: LAPAROSCOPIC INSERTION CONTINUOUS AMBULATORY PERITONEAL DIALYSIS  (CAPD) CATHETER;  Surgeon: Edward Jolly, MD;  Location: [place];  Service: General;  Laterality: N/A;  . Sp av dialysis shunt access existing *l*  2003  . Dg av dialysis shunt intro ndl*r* or      "not working" (09/19/11)  . Capd removal  03/12/2012    Procedure: CONTINUOUS AMBULATORY PERITONEAL DIALYSIS  (CAPD) CATHETER REMOVAL;  Surgeon: Edward Jolly, MD;  Location: [place];  Service: General;  Laterality: N/A;  . Amputation Left 02/02/2014    Procedure: AMPUTATION ABOVE KNEE-LEFT;  Surgeon: Elam Dutch, MD;  Location: [place];  Service: Vascular;  Laterality: Left;  . I&d extremity Left 03/31/2014    Procedure: IRRIGATION AND DEBRIDEMENT EXTREMITY - LEFT ABOVE KNEE AMPUTATION;  Surgeon: Elam Dutch, MD;  Location: [place];  Service: Vascular;  Laterality: Left;  . Application of wound vac Left  03/31/2014    Procedure: APPLICATION OF WOUND VAC - LEFT AKA;  Surgeon: Elam Dutch, MD;  Location: [place];  Service: Vascular;  Laterality: Left;  . Colon surgery  2011    colon resection   . Colonoscopy with propofol N/A 07/14/2014    Procedure: COLONOSCOPY WITH PROPOFOL;  Surgeon: Jerene Bears, MD;  Location: [facility];  Service: Gastroenterology;  Laterality: N/A;   Family History   Problem Relation Age of Onset  . Cancer Sister     breast  . Cancer Brother     lung  . Anesthesia problems Neg Hx   . Hypotension Neg Hx   . Malignant hyperthermia Neg Hx   . Pseudochol deficiency Neg Hx    Social History:  reports that he quit smoking about 39 years ago. His smoking use included Cigarettes. He has a 1.75 pack-year smoking history. He has never used smokeless tobacco. He reports that he does not drink alcohol or use illicit drugs. Allergies  Allergen Reactions  . Benazepril Hcl Hives, Itching and Rash    flareups on patient head   Prior to Admission medications   Medication Sig Start Date End Date Taking? Authorizing Provider  acetaminophen (TYLENOL) 325 MG tablet Take 650 mg by mouth every 4 (four) hours as needed for mild pain, fever or headache (DO NOT EXCEED 4 GM OF APAP IN 24 HOURS FORM ALL SOURCES).    Yes Historical Provider, MD  Amino Acids-Protein Hydrolys (FEEDING SUPPLEMENT, PRO-STAT SUGAR FREE 64,) LIQD Take 30 mLs by mouth daily.   Yes Historical Provider, MD  aspirin 81 MG chewable tablet Chew 81 mg by mouth daily.    Yes Historical Provider, MD  atorvastatin (LIPITOR) 20 MG tablet Take 20 mg by mouth at bedtime.    Yes Historical Provider, MD  b-complex w/C (TOTAL B/C) TABS tablet Take 1 tablet by mouth at bedtime.   Yes Historical Provider, MD  docusate sodium (COLACE) 100 MG capsule Take 100 mg by mouth daily.   Yes Historical Provider, MD  gabapentin (NEURONTIN) 100 MG capsule Take 100 mg by mouth at bedtime.    Yes Historical Provider, MD  loperamide (IMODIUM) 2 MG capsule Take 2-4 mg by mouth See admin instructions. 4 mg (as needed) initially followed by 2 mg after each loose stool   Yes Historical Provider, MD  midodrine (PROAMATINE) 5 MG tablet Take 5 mg by mouth 3 (three) times daily. Do not take 6 am dose on M- W- F- d/t  dialysis days. 02/01/15  Yes Historical Provider, MD  ondansetron (ZOFRAN) 4 MG tablet Take 4 mg by mouth every 6 (six) hours  as needed for nausea.   Yes Historical Provider, MD  sevelamer carbonate (RENVELA) 800 MG tablet Take 800 mg by mouth daily with lunch. 12 pm and 5 pm.   Yes Historical Provider, MD  sorbitol 70 % solution Take 30 mLs by mouth daily as needed (severe constipation).   Yes Historical Provider, MD  HYDROcodone-acetaminophen (NORCO) 10-325 MG tablet Take one tablet by mouth every 8 hours as needed for moderate pain. Do not exceed 4gm of Tylenol in 24 hours Patient taking differently: Take one tablet by mouth every 8 hours as needed for moderate pain. Do not exceed 4gm of Tylenol from all sources in 24 hours 11/11/15   Gayland Curry, DO   Current Facility-Administered Medications  Medication Dose Route Frequency Provider Last Rate Last Dose  . acetaminophen (TYLENOL) tablet 650 mg  650 mg Oral  Q4H PRN Ivor Costa, MD   650 mg at 11/23/15 2114  . alum & mag hydroxide-simeth (MAALOX/MYLANTA) 200-200-20 MG/5ML suspension 30 mL  30 mL Oral Q4H PRN Nishant Dhungel, MD   30 mL at 11/24/15 0902  . aspirin chewable tablet 81 mg  81 mg Oral Daily Ivor Costa, MD   81 mg at 11/24/15 0859  . atorvastatin (LIPITOR) tablet 20 mg  20 mg Oral QHS Ivor Costa, MD   20 mg at 11/23/15 2112  . B-complex with vitamin C tablet 1 tablet  1 tablet Oral QHS Ivor Costa, MD   1 tablet at 11/23/15 2112  . feeding supplement (PRO-STAT SUGAR FREE 64) liquid 30 mL  30 mL Oral Daily Ivor Costa, MD   30 mL at 11/24/15 0859  . gabapentin (NEURONTIN) capsule 100 mg  100 mg Oral QHS Ivor Costa, MD   100 mg at 11/23/15 2112  . heparin injection 5,000 Units  5,000 Units Subcutaneous Q8H Ivor Costa, MD   5,000 Units at 11/24/15 0556  . HYDROcodone-acetaminophen (NORCO) 10-325 MG per tablet 1 tablet  1 tablet Oral Q4H PRN Ivor Costa, MD   1 tablet at 11/23/15 2112  . hydrOXYzine (VISTARIL) injection 25 mg  25 mg Intramuscular Q6H PRN Ivor Costa, MD      . insulin aspart (novoLOG) injection 0-5 Units  0-5 Units Subcutaneous QHS Ivor Costa, MD   0 Units at  11/23/15 0023  . insulin aspart (novoLOG) injection 0-9 Units  0-9 Units Subcutaneous TID WC Ivor Costa, MD   2 Units at 11/23/15 1725  . metroNIDAZOLE (FLAGYL) IVPB 500 mg  500 mg Intravenous Q8H [place] Marisa Hua, MD   500 mg at 11/24/15 0556  . midodrine (PROAMATINE) tablet 5 mg  5 mg Oral 3 times per day on Sun Tue Thu Sat Ivor Costa, MD   5 mg at 11/23/15 1724   And  . midodrine (PROAMATINE) tablet 5 mg  5 mg Oral 2 times per day on Mon Wed Fri Ivor Costa, MD      . morphine 2 MG/ML injection 2 mg  2 mg Intravenous Q4H PRN Ivor Costa, MD   2 mg at 11/23/15 0423  . sevelamer carbonate (RENVELA) tablet 800 mg  800 mg Oral BID AC Ivor Costa, MD   800 mg at 11/23/15 1724  . sodium chloride flush (NS) 0.9 % injection 3 mL  3 mL Intravenous Q12H Ivor Costa, MD   3 mL at 11/24/15 1000  . vancomycin (VANCOCIN) 50 mg/mL oral solution 500 mg  500 mg Oral Q6H Ivor Costa, MD   500 mg at 11/24/15 0556   Labs: Basic Metabolic Panel:  Recent Labs Lab 11/22/15 1955 11/23/15 0220  NA 136 135  K 3.5 3.4*  CL 99* 102  CO2 23 20*  GLUCOSE 107* 104*  BUN 35* 37*  CREATININE 8.69* 8.52*  CALCIUM 8.0* 7.4*   Liver Function Tests:  Recent Labs Lab 11/22/15 1955  AST 19  ALT 10*  ALKPHOS 144*  BILITOT 1.7*  PROT 7.2  ALBUMIN 3.1*    Recent Labs Lab 11/22/15 1955  LIPASE 34    CBC:  Recent Labs Lab 11/22/15 1955 11/23/15 0220  WBC 9.5 8.2  NEUTROABS 7.5  --   HGB 10.9* 9.2*  HCT 34.9* 29.3*  MCV 83.5 82.5  PLT 119* 108*   CBG:  Recent Labs Lab 11/23/15 0733 11/23/15 1220 11/23/15 1551 11/23/15 2045 11/24/15 0814  GLUCAP 90 153* 175* 133* 110*  Studies/Results: Ct Abdomen Pelvis Wo Contrast  11/22/2015  CLINICAL DATA:  Abdominal pain beginning over the last 2 days. No vomiting. Recent diagnosis of abdominal blockage. EXAM: CT ABDOMEN AND PELVIS WITHOUT CONTRAST TECHNIQUE: Multidetector CT imaging of the abdomen and pelvis was performed following the standard protocol  without IV contrast. COMPARISON:  CTs, 11/07/2015 and 05/03/2014. FINDINGS: Lung bases: Heart is mildly enlarged. There are dense coronary artery calcifications. There is patchy and linear lung base opacity most evident in the left lower lobe, likely atelectasis. Pneumonia is possible. No pulmonary edema. No pleural effusion. Hepatobiliary: Liver is unremarkable. Gallbladder is distended. There are dependent stones but no convincing wall thickening or pericholecystic fluid. No bile duct dilation. Spleen: Mild enlargement measuring 16.5 x 6.7 x 15.4 cm. No splenic mass or focal lesion. Stable. Pancreas: Pancreas is thickened, particularly the pancreatic head, where it surrounds the celiac axis and superior mesenteric artery vasculature. Lower attenuation areas in the pancreatic tail suggest poorly defined cysts or an irregular dilated duct. Margins of the pancreas are ill-defined. This appearance is similar to the prior exam. Additional soft tissue attenuation extends along the gastrohepatic ligament consistent with poorly defined adenopathy. Largest presumed node measures 16 mm in short axis. Adrenal glands:  No masses. Kidneys, ureters, bladder: Marked renal parenchymal thinning. Numerous bilateral renal cysts. No hydronephrosis. Ureters normal course and in caliber. Bladder is decompressed. Prostate gland:  Mild to moderately enlarged but stable. Ascites:  None. Gastrointestinal: Mild wall thickening suggested along the descending and sigmoid colon extending to the rectum. There is no bowel dilation to suggest obstruction. There are changes from a previous partial right colectomy, stable. Vascular: There are extensive atherosclerotic calcifications throughout the aorta and branch vessels. Musculoskeletal: Large subchondral cysts are noted along the superior acetabula. There is increased density in the bony structures consistent with renal osteodystrophy. There multiple Schmorl's nodes along the spine. Abdominal  wall: Small fat containing left para midline abdominal wall hernia just below the umbilicus. IMPRESSION: 1. Abnormal appearance of the pancreas. An infiltrating pancreatic neoplasm is suspected. This could be better defined with pancreatic MRI with and without contrast if the patient can tolerate that procedure. There is evidence of gastrohepatic ligament chain adenopathy, extending along the porta hepatis. 2. Mild wall thickening of the descending colon, sigmoid colon and rectum consistent with a mild infectious or inflammatory colitis. No evidence of bowel obstruction. 3. Stable splenomegaly. 4. Distended gallbladder with dependent gallstones but no convincing acute cholecystitis. 5. Significant renal parenchymal thinning with multiple cysts with acquired renal cystic disease. 6. Increased opacity the base of the left lower lobe, most likely atelectasis. Consider pneumonia in the proper clinical setting. Electronically Signed   By: Lajean Manes M.D.   On: 11/22/2015 19:46   Dg Chest Port 1 View  11/22/2015  CLINICAL DATA:  Patient comes from [facility] for post CVA post MI. Patient started having Diarrhea since Friday. Patient started having Abd pain and fever 103.1 . Hx of diabetes, stroke, HTN. EXAM: PORTABLE CHEST 1 VIEW COMPARISON:  05/03/2014 FINDINGS: Nasogastric tube has been removed. Heart is mildly enlarged. Film is made with shallow lung inflation. There is linear density and adjacent lucency at the right lung base, likely related to linear atelectasis. However it is difficult to exclude free intraperitoneal air. There is focal atelectasis or early infiltrate at the left lung base. Mild pulmonary edema is noted. IMPRESSION: 1. Question of free intraperitoneal air beneath the right hemidiaphragm. Further evaluation with left lateral decubitus view of  the abdomen or CT of the abdomen and pelvis is recommended. 2. Bibasilar atelectasis or infiltrates, left greater than right. 3. Mild interstitial  pulmonary edema. Electronically Signed   By: Nolon Nations M.D.   On: 11/22/2015 17:40   Dg Abd Portable 2v  11/22/2015  CLINICAL DATA:  Diarrhea since Friday with history previous small-bowel obstruction. EXAM: PORTABLE ABDOMEN - 2 VIEW COMPARISON:  X-ray 11/08/2015.  CT scan 10/30/2015. FINDINGS: Right-side-up decubitus film shows no evidence for intraperitoneal free air. Diffuse gaseous small bowel distention is noted. There is some high attenuation which appears to be in small bowel loops over the central abdomen, nonspecific. Given the central clustering of small bowel , ventral hernia would be a consideration. Bones are diffusely demineralized. , IMPRESSION: Diffuse gaseous bowel distention without evidence for intraperitoneal free air. Ileus or small-bowel obstruction could have this appearance. Central clustering of small bowel raises the question for bowel herniation into a ventral hernia which was seen on the previous CT scan. Electronically Signed   By: Misty Stanley M.D.   On: 11/22/2015 19:03    ROS: As per HPI otherwise negative.   Physical Exam: Filed Vitals:   11/23/15 2053 11/24/15 0255 11/24/15 0631 11/24/15 0700  BP: 127/57  100/56 105/54  Pulse:   81 81  Temp: 100 F (37.8 C)  98.6 F (37 C) 98.4 F (36.9 C)  TempSrc: Oral  Oral Oral  Resp: 18  16 18   Height:      Weight:  72.122 kg (159 lb)    SpO2: 96%  97% 99%     General: Pleasant cooperative in NAD Head: Normocephalic, atraumatic, sclera non-icteric, mucus membranes are moist Neck: Supple. JVD not elevated. Lungs: Clear bilaterally to auscultation without wheezes, rales, or rhonchi. Breathing is unlabored. Heart: RRR with S1 S2. III/VI systolic M.  Abdomen: diffuse tenderness but primary in L and RLQ. Slightly distended, active BS.  M-S:  Strength and tone appear normal for age. Lower extremities: L AKA. No LE edema.   Neuro: Alert and oriented X 3. Moves all extremities spontaneously. Psych:  Responds to  questions appropriately with a normal affect. Dialysis Access:LFA AVG + bruit  Dialysis Orders: [facility] MWF 4 hrs 400/500 EDW 71 kg 2.0K/2.25 Ca UF profile 4 Linear NA L AVG Heparin 5000 units IV q treatment Mircera 75 mcg IV q 4 weeks (last dose 11/12/15 HGB 10.3 11/17/15) Hectorol 3 mcg IV q tx (last PTH 1794 10/07/15)  Assessment/Plan: 1.  C Diff per PCR: On flagyl/vanc per primary. Enteric isolation.  2.  Gallstone pancreatitis: Surgery has been consulted  CT of abd/pelvis  06/12/ 17Abnormal appearance of pancreas-suspected infiltrating neoplasm suspected. For MRI today for further investigation. T bili ^ 1.7 lipase 34.  3.  ESRD -  MWF at [facility]. Refused to come to HD Monday, stated he felt too bad. Agrees to come to HD today.  K+3.4 use 4 k bath 4.  Hypertension/volume  - BP controlled, Requires Midodrine on HD days. Wt 72.2 UFG 1.5-2 liters today.  5.  Anemia  - HGB 9.2 recent ESA dose-follow HGB-may require re-dosing.  6.  Metabolic bone disease -  Last incenter phos 1.8 11/17/15. Stop binders, Cont VDRA 7.  Nutrition - Albumin 3.1 On heart healthy/Carb mod diet. K+ 3.4 Will leave for now. Nepro/renal vit.  8.  DM: Per primary  9.    Chronic systolic CHF: Volume stable per primary    H. Owens Shark, NP-C 11/24/2015, 10:57 AM  [facility] [phone]

## 2015-11-24 NOTE — Progress Notes (Signed)
PROGRESS NOTE                                                                                                                                                                                                             Patient Demographics:    Sean [name], is a 80 y.o. male, DOB - [birth year], [id number]  Admit date - 11/22/2015   Admitting Physician Ivor Costa, MD  Outpatient Primary MD for the patient is [name], MD  LOS - 2  Outpatient Specialists: Nephrology  Chief Complaint  Patient presents with  . Diarrhea       Brief Narrative   80 year old male with history of ischemic colitis status post bowel resection, cholelithiasis, recent hospitalization for gallstone pancreatitis, ESRD on dialysis (M, W, F), status post left AKA, hypotension, anemia, anxiety and depression, diet-controlled diabetes mellitus presented with abdominal pain as stated with fever and diarrhea for the past 3 days. Reported 6-7 bowel movements with loose stools with periumbilical pain, [id number] in severe at the nonradiating. Reported nausea but no vomiting. He reported having fever of 103F at home.  In the ED stool first C. difficile was positive, had mildly elevated lactic acid and had fever of 9 and 0.55F. Creatinine was 8.69. CT abdomen pelvis done in the ED showed mild infectious versus inflammatory colitis of the descending, sigmoid colon and rectum. Also showed suspicious infiltrating pancreatic neoplasm.    Subjective:   Patient reports his abdominal pain to be better and diarrhea improving this morning. Had 3 episodes since admission.   Assessment  & Plan :    Principal Problem:   Sepsis with C. difficile colitis Sepsis resolved. On oral vancomycin. Clinically improving. Continue antiemetics when necessary.  Active Problems: Recent history of gallstone pancreatitis. Was scheduled for cholecystectomy today. Surgery was  consulted who recommended holding off until acute illness had resolved. No clinical signs or symptoms of acute pancreatitis.  ? Pancreatic mass. Cannot get MRI with contrast. Will obtain MRI without contrast to evaluate further. Denies weight loss and reports remote smoking history. Check CA 19-9.     End stage renal disease on dialysis [facility]) Receiving scheduled dialysis per renal.  CAD Continue aspirin and Lipitor.  Diet-controlled diabetes mellitus Monitor on SSI.  Chronic systolic CHF (congestive heart failure) ([place]): Euvolemic. Volume management with dialysis.  Code Status : Full Code  Family Communication  : None at bedside  Disposition Plan  : SNF for PT. Should be stable for discharge tomorrow if diarrhea continues to improve  Barriers For Discharge : Symptoms not resolved it. MRI of the abdomen  Consults  :   Renal CCS  Procedures  :  CT abdomen and pelvis  DVT Prophylaxis  : Heparin -   Lab Results  Component Value Date   PLT 108* 11/23/2015    Antibiotics  :    Anti-infectives    Start     Dose/Rate Route Frequency Ordered Stop   11/23/15 0830  metroNIDAZOLE (FLAGYL) IVPB 500 mg     500 mg 100 mL/hr over 60 Minutes Intravenous Every 8 hours 11/23/15 0814     11/23/15 0115  vancomycin (VANCOCIN) 50 mg/mL oral solution 500 mg     500 mg Oral Every 6 hours 11/23/15 0113 12/06/15 2359   11/22/15 2359  metroNIDAZOLE (FLAGYL) IVPB 500 mg  Status:  Discontinued     500 mg 100 mL/hr over 60 Minutes Intravenous Every 8 hours 11/22/15 2311 11/23/15 0113   11/22/15 2359  ciprofloxacin (CIPRO) IVPB 400 mg  Status:  Discontinued    Comments:  Cipro 400 mg IV q24h for CrCl < 30 mL/min   400 mg 200 mL/hr over 60 Minutes Intravenous Daily at bedtime 11/22/15 2332 11/23/15 0113   11/22/15 2200  piperacillin-tazobactam (ZOSYN) IVPB 2.25 g  Status:  Discontinued     2.25 g 100 mL/hr over 30 Minutes Intravenous Every 8 hours 11/22/15 1631 11/22/15  2310   11/22/15 1630  piperacillin-tazobactam (ZOSYN) IVPB 3.375 g  Status:  Discontinued     3.375 g 100 mL/hr over 30 Minutes Intravenous  Once 11/22/15 1616 11/22/15 2200        Objective:   Filed Vitals:   11/23/15 2053 11/24/15 0255 11/24/15 0631 11/24/15 0700  BP: 127/57  100/56 105/54  Pulse:   81 81  Temp: 100 F (37.8 C)  98.6 F (37 C) 98.4 F (36.9 C)  TempSrc: Oral  Oral Oral  Resp: 18  16 18   Height:      Weight:  72.122 kg (159 lb)    SpO2: 96%  97% 99%    Wt Readings from Last 3 Encounters:  11/24/15 72.122 kg (159 lb)  11/12/15 85.73 kg (189 lb)  11/11/15 85.73 kg (189 lb)     Intake/Output Summary (Last 24 hours) at 11/24/15 1506 Last data filed at 11/24/15 0556  Gross per 24 hour  Intake    760 ml  Output      4 ml  Net    756 ml     Physical Exam  Gen: not in distress HEENT: no pallor, moist mucosa, supple neck Chest: clear b/l, no added sounds CVS: N S1&S2, no murmurs, rubs or gallop GI: soft, NT, ND, BS+ Musculoskeletal: warm, no edema CNS: AAOX3, non focal    Data Review:    CBC  Recent Labs Lab 11/22/15 1955 11/23/15 0220  WBC 9.5 8.2  HGB 10.9* 9.2*  HCT 34.9* 29.3*  PLT 119* 108*  MCV 83.5 82.5  MCH 26.1 25.9*  MCHC 31.2 31.4  RDW 16.5* 16.4*  LYMPHSABS 0.6*  --   MONOABS 1.4*  --   EOSABS 0.0  --   BASOSABS 0.0  --     Chemistries   Recent Labs Lab 11/22/15 1955 11/23/15 0220  NA 136 135  K 3.5 3.4*  CL 99* 102  CO2 23 20*  GLUCOSE 107* 104*  BUN 35* 37*  CREATININE 8.69* 8.52*  CALCIUM 8.0* 7.4*  AST 19  --   ALT 10*  --   ALKPHOS 144*  --   BILITOT 1.7*  --    ------------------------------------------------------------------------------------------------------------------ No results for input(s): CHOL, HDL, LDLCALC, TRIG, CHOLHDL, LDLDIRECT in the last 72 hours.  Lab Results  Component Value Date   HGBA1C 6.6 07/07/2015    ------------------------------------------------------------------------------------------------------------------ No results for input(s): TSH, T4TOTAL, T3FREE, THYROIDAB in the last 72 hours.  Invalid input(s): FREET3 ------------------------------------------------------------------------------------------------------------------ No results for input(s): VITAMINB12, FOLATE, FERRITIN, TIBC, IRON, RETICCTPCT in the last 72 hours.  Coagulation profile  Recent Labs Lab 11/22/15 0010  INR 1.49    No results for input(s): DDIMER in the last 72 hours.  Cardiac Enzymes No results for input(s): CKMB, TROPONINI, MYOGLOBIN in the last 168 hours.  Invalid input(s): CK ------------------------------------------------------------------------------------------------------------------ No results found for: BNP  Inpatient Medications  Scheduled Meds: . aspirin  81 mg Oral Daily  . atorvastatin  20 mg Oral QHS  . B-complex with vitamin C  1 tablet Oral QHS  . feeding supplement (PRO-STAT SUGAR FREE 64)  30 mL Oral Daily  . gabapentin  100 mg Oral QHS  . heparin  5,000 Units Subcutaneous Q8H  . insulin aspart  0-5 Units Subcutaneous QHS  . insulin aspart  0-9 Units Subcutaneous TID WC  . metronidazole  500 mg Intravenous Q8H  . midodrine  5 mg Oral 3 times per day on Sun Tue Thu Sat   And  . midodrine  5 mg Oral 2 times per day on Mon Wed Fri  . sodium chloride flush  3 mL Intravenous Q12H  . vancomycin  500 mg Oral Q6H   Continuous Infusions:  PRN Meds:.acetaminophen, alum & mag hydroxide-simeth, HYDROcodone-acetaminophen, hydrOXYzine, morphine injection  Micro Results Recent Results (from the past 240 hour(s))  Blood Culture (routine x 2)     Status: None (Preliminary result)   Collection Time: 11/22/15  7:50 PM  Result Value Ref Range Status   Specimen Description BLOOD RIGHT ARM  Final   Special Requests BOTTLES DRAWN AEROBIC AND ANAEROBIC 5CC  Final   Culture NO GROWTH <  24 HOURS  Final   Report Status PENDING  Incomplete  Blood Culture (routine x 2)     Status: None (Preliminary result)   Collection Time: 11/22/15  7:55 PM  Result Value Ref Range Status   Specimen Description BLOOD RIGHT HAND  Final   Special Requests BOTTLES DRAWN AEROBIC AND ANAEROBIC 5CC  Final   Culture NO GROWTH < 24 HOURS  Final   Report Status PENDING  Incomplete  C difficile quick scan w PCR reflex     Status: Abnormal   Collection Time: 11/22/15 11:27 PM  Result Value Ref Range Status   C Diff antigen POSITIVE (A) NEGATIVE Final   C Diff toxin POSITIVE (A) NEGATIVE Final   C Diff interpretation Positive for toxigenic C. difficile  Final    Comment: CRITICAL RESULT CALLED TO, READ BACK BY AND VERIFIED WITH: B OLDAND,RN @0100  11/23/15 MKELLY   Gastrointestinal Panel by PCR , Stool     Status: None   Collection Time: 11/22/15 11:27 PM  Result Value Ref Range Status   Campylobacter species NOT DETECTED NOT DETECTED Final   Plesimonas shigelloides NOT DETECTED NOT DETECTED Final   Salmonella species NOT DETECTED NOT DETECTED Final   Yersinia enterocolitica NOT DETECTED NOT DETECTED  Final   Vibrio species NOT DETECTED NOT DETECTED Final   Vibrio cholerae NOT DETECTED NOT DETECTED Final   Enteroaggregative E coli (EAEC) NOT DETECTED NOT DETECTED Final   Enteropathogenic E coli (EPEC) NOT DETECTED NOT DETECTED Final   Enterotoxigenic E coli (ETEC) NOT DETECTED NOT DETECTED Final   Shiga like toxin producing E coli (STEC) NOT DETECTED NOT DETECTED Final   E. coli O157 NOT DETECTED NOT DETECTED Final   Shigella/Enteroinvasive E coli (EIEC) NOT DETECTED NOT DETECTED Final   Cryptosporidium NOT DETECTED NOT DETECTED Final   Cyclospora cayetanensis NOT DETECTED NOT DETECTED Final   Entamoeba histolytica NOT DETECTED NOT DETECTED Final   Giardia lamblia NOT DETECTED NOT DETECTED Final   Adenovirus F40/41 NOT DETECTED NOT DETECTED Final   Astrovirus NOT DETECTED NOT DETECTED Final    Norovirus GI/GII NOT DETECTED NOT DETECTED Final   Rotavirus A NOT DETECTED NOT DETECTED Final   Sapovirus (I, II, IV, and V) NOT DETECTED NOT DETECTED Final  MRSA PCR Screening     Status: None   Collection Time: 11/23/15  6:22 AM  Result Value Ref Range Status   MRSA by PCR NEGATIVE NEGATIVE Final    Comment:        The GeneXpert MRSA Assay (FDA approved for NASAL specimens only), is one component of a comprehensive MRSA colonization surveillance program. It is not intended to diagnose MRSA infection nor to guide or monitor treatment for MRSA infections.     Radiology Reports Ct Abdomen Pelvis Wo Contrast  11/22/2015  CLINICAL DATA:  Abdominal pain beginning over the last 2 days. No vomiting. Recent diagnosis of abdominal blockage. EXAM: CT ABDOMEN AND PELVIS WITHOUT CONTRAST TECHNIQUE: Multidetector CT imaging of the abdomen and pelvis was performed following the standard protocol without IV contrast. COMPARISON:  CTs, 11/07/2015 and 05/03/2014. FINDINGS: Lung bases: Heart is mildly enlarged. There are dense coronary artery calcifications. There is patchy and linear lung base opacity most evident in the left lower lobe, likely atelectasis. Pneumonia is possible. No pulmonary edema. No pleural effusion. Hepatobiliary: Liver is unremarkable. Gallbladder is distended. There are dependent stones but no convincing wall thickening or pericholecystic fluid. No bile duct dilation. Spleen: Mild enlargement measuring 16.5 x 6.7 x 15.4 cm. No splenic mass or focal lesion. Stable. Pancreas: Pancreas is thickened, particularly the pancreatic head, where it surrounds the celiac axis and superior mesenteric artery vasculature. Lower attenuation areas in the pancreatic tail suggest poorly defined cysts or an irregular dilated duct. Margins of the pancreas are ill-defined. This appearance is similar to the prior exam. Additional soft tissue attenuation extends along the gastrohepatic ligament consistent with  poorly defined adenopathy. Largest presumed node measures 16 mm in short axis. Adrenal glands:  No masses. Kidneys, ureters, bladder: Marked renal parenchymal thinning. Numerous bilateral renal cysts. No hydronephrosis. Ureters normal course and in caliber. Bladder is decompressed. Prostate gland:  Mild to moderately enlarged but stable. Ascites:  None. Gastrointestinal: Mild wall thickening suggested along the descending and sigmoid colon extending to the rectum. There is no bowel dilation to suggest obstruction. There are changes from a previous partial right colectomy, stable. Vascular: There are extensive atherosclerotic calcifications throughout the aorta and branch vessels. Musculoskeletal: Large subchondral cysts are noted along the superior acetabula. There is increased density in the bony structures consistent with renal osteodystrophy. There multiple Schmorl's nodes along the spine. Abdominal wall: Small fat containing left para midline abdominal wall hernia just below the umbilicus. IMPRESSION: 1. Abnormal appearance of the  pancreas. An infiltrating pancreatic neoplasm is suspected. This could be better defined with pancreatic MRI with and without contrast if the patient can tolerate that procedure. There is evidence of gastrohepatic ligament chain adenopathy, extending along the porta hepatis. 2. Mild wall thickening of the descending colon, sigmoid colon and rectum consistent with a mild infectious or inflammatory colitis. No evidence of bowel obstruction. 3. Stable splenomegaly. 4. Distended gallbladder with dependent gallstones but no convincing acute cholecystitis. 5. Significant renal parenchymal thinning with multiple cysts with acquired renal cystic disease. 6. Increased opacity the base of the left lower lobe, most likely atelectasis. Consider pneumonia in the proper clinical setting. Electronically Signed   By: Lajean Manes M.D.   On: 11/22/2015 19:46   Ct Abdomen Pelvis Wo  Contrast  11/07/2015  CLINICAL DATA:  Abdominal pain, elevated lipase. Right upper quadrant pain. EXAM: CT ABDOMEN AND PELVIS WITHOUT CONTRAST TECHNIQUE: Multidetector CT imaging of the abdomen and pelvis was performed following the standard protocol without IV contrast. COMPARISON:  Ultrasound earlier this day.  CT 05/06/2014 FINDINGS: Lower chest: Extensive coronary artery calcifications. Calcifications at the lung hila, partially included. Probable bilateral calcified hilar lymph nodes, suboptimally assessed. Atelectasis at the lung bases. Liver: No evidence of focal lesion allowing for lack contrast. Hepatobiliary: Distended gallbladder containing dependent gallstones. Previously assessed by ultrasound. Pancreas: Suboptimally assessed without contrast, however there is diffuse soft tissue fullness. Mild peripancreatic edema. Spleen: Enlarged measuring 15.4 cm craniocaudal. Adrenal glands: Fullness bilaterally.  No discrete nodule. Kidneys: Atrophic parenchyma. Multiple bilateral renal cysts, with a large parapelvic cyst on the left. Some of the cysts appear complicated. Extensive renovascular calcifications. Stomach/Bowel: Stomach distended with ingested contents. Dilated fluid-filled proximal small bowel with transition in the mid right abdomen image 55 series 2. There is mesenteric edema and free fluid. No pneumatosis. More distal small bowel loops are decompressed. Enteric sutures noted at the base of the cecum. There is cecal and ascending colonic wall thickening. Small volume of stool throughout the remainder the colon. Vascular/Lymphatic: Dense atherosclerosis of the abdominal aorta. No aneurysm. No definite retroperitoneal adenopathy. Reproductive: Prominent sized prostate gland causing mass effect on the bladder base. Bladder: Decompressed. Other: Small volume of free fluid in the abdomen and pelvis. No evidence of free air. Postsurgical change in the anterior abdominal wall with small fat containing  periumbilical ventral abdominal wall hernia. Musculoskeletal: There are no acute or suspicious osseous abnormalities. 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 Bony fusion of the sacroiliac joints. Degenerative change throughout spine. IMPRESSION: 1. Small-bowel obstruction with transition point in the right mid abdomen. 2. Soft tissue fullness about the entire pancreas. There is mild peripancreatic edema, however diffuse mesenteric edema is also seen. Underlying pancreatic mass cannot be excluded. If patient is able tolerate breath hold technique, MRI may be of value. 3. Splenomegaly. 4. Cholelithiasis, please reference same-day right upper quadrant ultrasound. 5. Advanced atherosclerosis. 6. Additional chronic findings as described. Electronically Signed   By: Jeb Levering M.D.   On: 11/07/2015 05:55   Dg Chest Port 1 View  11/22/2015  CLINICAL DATA:  Patient comes [facility] for post CVA post MI. Patient started having Diarrhea since Friday. Patient started having Abd pain and fever 103.1 . Hx of diabetes, stroke, HTN. EXAM: PORTABLE CHEST 1 VIEW COMPARISON:  05/03/2014 FINDINGS: Nasogastric tube has been removed. Heart is mildly enlarged. Film is made with shallow lung inflation. There is linear density and adjacent lucency at the right lung base, likely related to linear  atelectasis. However it is difficult to exclude free intraperitoneal air. There is focal atelectasis or early infiltrate at the left lung base. Mild pulmonary edema is noted. IMPRESSION: 1. Question of free intraperitoneal air beneath the right hemidiaphragm. Further evaluation with left lateral decubitus view of the abdomen or CT of the abdomen and pelvis is recommended. 2. Bibasilar atelectasis or infiltrates, left greater than right. 3. Mild interstitial pulmonary edema. Electronically Signed   By: Nolon Nations M.D.   On: 11/22/2015 17:40   Dg Abd 2 Views  11/08/2015  CLINICAL DATA:  Small-bowel  obstruction EXAM: ABDOMEN - 2 VIEW COMPARISON:  CT abdomen 11/07/2015 FINDINGS: Mildly dilated small bowel loops with air-fluid levels. Gas in the transverse colon with air-fluid levels. Upright view does not include the right hemidiaphragm. This would need to be repeated if there is concern of free air. IMPRESSION: Air-fluid levels in large and small bowel may be related to ileus or resolving small bowel obstruction Right hemidiaphragm not included on the upright view. If there is concern of free air, repeat upright view could be performed. Electronically Signed   By: Franchot Gallo M.D.   On: 11/08/2015 15:01   Dg Abd Portable 2v  11/22/2015  CLINICAL DATA:  Diarrhea since Friday with history previous small-bowel obstruction. EXAM: PORTABLE ABDOMEN - 2 VIEW COMPARISON:  X-ray 11/08/2015.  CT scan 10/30/2015. FINDINGS: Right-side-up decubitus film shows no evidence for intraperitoneal free air. Diffuse gaseous small bowel distention is noted. There is some high attenuation which appears to be in small bowel loops over the central abdomen, nonspecific. Given the central clustering of small bowel , ventral hernia would be a consideration. Bones are diffusely demineralized. , IMPRESSION: Diffuse gaseous bowel distention without evidence for intraperitoneal free air. Ileus or small-bowel obstruction could have this appearance. Central clustering of small bowel raises the question for bowel herniation into a ventral hernia which was seen on the previous CT scan. Electronically Signed   By: Misty Stanley M.D.   On: 11/22/2015 19:03   US Abdomen Limited Ruq  11/07/2015  CLINICAL DATA:  Chronic periumbilical pain for 1 month. Initial encounter. EXAM: US ABDOMEN LIMITED - RIGHT UPPER QUADRANT COMPARISON:  CT of the abdomen and pelvis from 05/03/2014 FINDINGS: Gallbladder: Stones and sludge are noted dependently within the gallbladder. The gallbladder is otherwise unremarkable. No significant gallbladder wall  thickening or pericholecystic fluid is seen. No ultrasonographic Murphy's sign is elicited. Common bile duct: Diameter: 0.6 cm, within normal limits in caliber. Liver: No focal lesion identified. Within normal limits in parenchymal echogenicity. IMPRESSION: 1. No acute abnormality seen at the right upper quadrant. 2. Stones and sludge seen dependently within the gallbladder. Gallbladder otherwise unremarkable in appearance. Electronically Signed   By: Garald Balding M.D.   On: 11/07/2015 01:20    Time Spent in minutes  25   Louellen Molder M.D on 11/24/2015 at 3:06 PM  Between 7am to 7pm - Pager - [phone]  After 7pm go to [email or web address] - password [facility]  [facility] -  Office  [phone]

## 2015-11-25 ENCOUNTER — Inpatient Hospital Stay (HOSPITAL_COMMUNITY): Payer: Medicare Other

## 2015-11-25 LAB — GLUCOSE, CAPILLARY
GLUCOSE-CAPILLARY: 107 mg/dL — AB (ref 65–99)
Glucose-Capillary: 103 mg/dL — ABNORMAL HIGH (ref 65–99)
Glucose-Capillary: 116 mg/dL — ABNORMAL HIGH (ref 65–99)
Glucose-Capillary: 124 mg/dL — ABNORMAL HIGH (ref 65–99)
Glucose-Capillary: 142 mg/dL — ABNORMAL HIGH (ref 65–99)

## 2015-11-25 NOTE — Progress Notes (Signed)
Pt refusing PIV start for CT at this time.  Stated " I need to get some of my affairs in order.  You cannot do it right now"  Explained need for CT scan and VAS Team availability.  States " tell them I am not doing it right now".  Luke RN notified.

## 2015-11-25 NOTE — NC FL2 (Signed)
[place] LEVEL OF CARE SCREENING TOOL     IDENTIFICATION  Patient Name: Sean [name] Birthdate: 27-Oct-1935 Sex: male Admission Date (Current Location): 11/22/2015  [facility] and [place] Number:  Herbalist and Address:  [facility] [place], [place] [address], [place], [place] [address]      Provider Number: [id number]  Attending Physician Name and Address:  Louellen Molder, MD  Relative Name and Phone Number:  Darrius Inouye - son. Phone [phone]    Current Level of Care: Hospital Recommended Level of Care: [place] Prior Approval Number:    Date Approved/Denied:   PASRR Number: [id number] (Eff. 06/25/14)  Discharge Plan: SNF    Current Diagnoses: Patient Active Problem List   Diagnosis Date Noted  . C. difficile colitis 11/23/2015  . Sepsis ([place]) 11/23/2015  . Diarrhea 11/22/2015  . Chronic systolic CHF (congestive heart failure) ([place])   . Gallstone pancreatitis 11/07/2015  . Abdominal pain, epigastric 11/07/2015  . PAD (peripheral artery disease) ([place]) 09/14/2015  . Phantom pain ([place]) 09/14/2015  . Hemodialysis-associated hypotension 09/14/2015  . Protein-calorie malnutrition ([place]) 09/14/2015  . Type 2 diabetes mellitus with chronic kidney disease on chronic dialysis, without long-term current use of insulin ([place]) 03/25/2015  . Chronic combined systolic and diastolic heart failure ([place]) 03/01/2015  . Neuropathic pain 01/12/2015  . Abnormal CT scan 05/29/2014  . Ischemic colitis ([place]) 05/08/2014  . Cholelithiasis   . Atherosclerotic peripheral vascular disease with ulceration ([place])   . Colitis, ischemic ([place]) 05/03/2014  . S/P AKA (above knee amputation) unilateral ([place]) 05/03/2014  . Dyslipidemia 04/07/2014  . 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 Depression 01/01/2014  . Secondary hyperparathyroidism ([place]) 12/20/2013  . CAD (coronary artery disease), native  coronary artery 12/16/2013  . Diabetes mellitus due to underlying condition ([place]) 09/19/2011  . Anxiety state 01/15/2009  . Acute myocardial infarction ([place]) 01/15/2009  . End stage renal disease on dialysis ([place]) 01/15/2009    Orientation RESPIRATION BLADDER Height & Weight     Self, Time, Situation, Place  Normal Continent Weight: 154 lb 5.2 oz (70 kg) Height:  5\' 5"  (165.1 cm)  BEHAVIORAL SYMPTOMS/MOOD NEUROLOGICAL BOWEL NUTRITION STATUS      Incontinent Diet (Heart healthy/carb modified)  AMBULATORY STATUS COMMUNICATION OF NEEDS Skin   Extensive Assist (needs rolling walker and prosthesis) Verbally Normal                       Personal Care Assistance Level of Assistance    Bathing Assistance: Maximum assistance Feeding assistance: Independent (Patient requires assistance with sitting when eating) Dressing Assistance: Maximum assistance     Functional Limitations Info  Sight, Hearing, Speech Sight Info: Impaired (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) Hearing Info: Adequate Speech Info: Adequate    SPECIAL CARE FACTORS FREQUENCY  PT (By licensed PT), OT (By licensed OT)     PT Frequency: Evaluated 6/13 and a minimum of 3X per week therapy recommended OT Frequency: Evaluated 5/13 and a minimum of 2X per week therapy recommended            Contractures Contractures Info: Not present    Additional Factors Info  Code Status, Allergies, Insulin Sliding Scale, Isolation Precautions Code Status Info: Full Code Allergies Info: Benazepril Hcl   Insulin Sliding Scale Info: Insulin 0-9 Units 3X per day with meals and Insulin 0-5 Units daily at bedtime Isolation Precautions Info: Positive C-diff  Current Medications (11/25/2015):  This is the current hospital active medication list Current Facility-Administered Medications  Medication Dose Route Frequency Provider Last Rate Last Dose  . 0.9 %  sodium chloride infusion  100 mL Intravenous PRN Valentina Gu,  NP      . 0.9 %  sodium chloride infusion  100 mL Intravenous PRN Valentina Gu, NP      . acetaminophen (TYLENOL) tablet 650 mg  650 mg Oral Q4H PRN Ivor Costa, MD   650 mg at 11/23/15 2114  . alteplase (CATHFLO ACTIVASE) injection 2 mg  2 mg Intracatheter Once PRN Valentina Gu, NP      . alum & mag hydroxide-simeth (MAALOX/MYLANTA) 200-200-20 MG/5ML suspension 30 mL  30 mL Oral Q4H PRN Nishant Dhungel, MD   30 mL at 11/24/15 0902  . aspirin chewable tablet 81 mg  81 mg Oral Daily Ivor Costa, MD   81 mg at 11/25/15 0913  . atorvastatin (LIPITOR) tablet 20 mg  20 mg Oral QHS Ivor Costa, MD   20 mg at 11/25/15 0005  . B-complex with vitamin C tablet 1 tablet  1 tablet Oral QHS Ivor Costa, MD   1 tablet at 11/25/15 0005  . feeding supplement (PRO-STAT SUGAR FREE 64) liquid 30 mL  30 mL Oral Daily Ivor Costa, MD   30 mL at 11/25/15 0913  . gabapentin (NEURONTIN) capsule 100 mg  100 mg Oral QHS Ivor Costa, MD   100 mg at 11/25/15 0005  . heparin injection 1,000 Units  1,000 Units Dialysis PRN Valentina Gu, NP      . heparin injection 5,000 Units  5,000 Units Subcutaneous Q8H Ivor Costa, MD   5,000 Units at 11/25/15 0554  . heparin injection 5,000 Units  5,000 Units Dialysis Once in dialysis Valentina Gu, NP      . HYDROcodone-acetaminophen [facility]) 10-325 MG per tablet 1 tablet  1 tablet Oral Q4H PRN Ivor Costa, MD   1 tablet at 11/23/15 2112  . hydrOXYzine (VISTARIL) injection 25 mg  25 mg Intramuscular Q6H PRN Ivor Costa, MD      . insulin aspart (novoLOG) injection 0-5 Units  0-5 Units Subcutaneous QHS Ivor Costa, MD   0 Units at 11/23/15 0023  . insulin aspart (novoLOG) injection 0-9 Units  0-9 Units Subcutaneous TID WC Ivor Costa, MD   1 Units at 11/24/15 1716  . lidocaine (PF) (XYLOCAINE) 1 % injection 5 mL  5 mL Intradermal PRN Valentina Gu, NP      . lidocaine-prilocaine (EMLA) cream 1 application  1 application Topical PRN Valentina Gu, NP      . metroNIDAZOLE  (FLAGYL) IVPB 500 mg  500 mg Intravenous Q8H [place] Marisa Hua, MD   500 mg at 11/25/15 0552  . midodrine (PROAMATINE) tablet 5 mg  5 mg Oral 3 times per day on Sun Tue Thu Sat Ivor Costa, MD   5 mg at 11/25/15 1155   And  . midodrine (PROAMATINE) tablet 5 mg  5 mg Oral 2 times per day on Mon Wed Fri Ivor Costa, MD   5 mg at 11/24/15 1716  . morphine 2 MG/ML injection 2 mg  2 mg Intravenous Q4H PRN Ivor Costa, MD   2 mg at 11/23/15 0423  . pentafluoroprop-tetrafluoroeth (GEBAUERS) aerosol 1 application  1 application Topical PRN Valentina Gu, NP      . sodium chloride flush (NS) 0.9 % injection 3 mL  3 mL Intravenous Q12H  Ivor Costa, MD   3 mL at 11/25/15 1000  . vancomycin (VANCOCIN) 50 mg/mL oral solution 500 mg  500 mg Oral Q6H Ivor Costa, MD   500 mg at 11/25/15 1155     Discharge Medications: Please see discharge summary for a list of discharge medications.  Relevant Imaging Results:  Relevant Lab Results:   Additional Information ss#[id number].  Dailysis patient MWF at [facility]. Chair time 5:55 am.  [name], LCSW

## 2015-11-25 NOTE — Progress Notes (Signed)
Patient scheduled to have a CT pancreas tonight but refused to have new IV placed required for procedure. I tried to explain to him the importance of the procedure. MD was notified. MD called and then spoke with the patient regarding the procedure. Patient still refused and said " I need to talk with my son first." the patient then called his son to explain the situation. The son said he needed to speak with another family member and would call back. Still waiting for son to call back.

## 2015-11-25 NOTE — Progress Notes (Signed)
PROGRESS NOTE                                                                                                                                                                                                             Patient Demographics:    Sean [name], is a 80 y.o. male, DOB - [birth year], [id number]  Admit date - 11/22/2015   Admitting Physician Ivor Costa, MD  Outpatient Primary MD for the patient is [name], MD  LOS - 3  Outpatient Specialists: Nephrology  Chief Complaint  Patient presents with  . Diarrhea       Brief Narrative   80 year old male with history of ischemic colitis status post bowel resection, cholelithiasis, recent hospitalization for gallstone pancreatitis, ESRD on dialysis (M, W, F), status post left AKA, hypotension, anemia, anxiety and depression, diet-controlled diabetes mellitus presented with abdominal pain as stated with fever and diarrhea for the past 3 days. Reported 6-7 bowel movements with loose stools with periumbilical pain, [id number] in severe at the nonradiating. Reported nausea but no vomiting. He reported having fever of 103F at home.  In the ED stool first C. difficile was positive, had mildly elevated lactic acid and had fever of 9 and 0.26F. Creatinine was 8.69. CT abdomen pelvis done in the ED showed mild infectious versus inflammatory colitis of the descending, sigmoid colon and rectum. Also showed suspicious infiltrating pancreatic neoplasm.    Subjective:   Mild LLQ soreness. Had 3 episdoes of diarrhea today   Assessment  & Plan :    Principal Problem:   Sepsis with C. difficile colitis Sepsis resolved. On oral vancomycin. Clinically improving. Continue antiemetics when necessary.  Active Problems: Recent history of gallstone pancreatitis. Was scheduled for cholecystectomy on 6/14. Surgery was consulted who recommended holding off until acute illness had  resolved. No clinical signs or symptoms of acute pancreatitis.  ? Pancreatic mass. MRI without contrast done shows poorly defined mass over head of pancreas and obstructive pancreatic duct suspicious for adenocarcinoma. Possible involvement of SMA , celiac arteries and occlusion of portal vein. Recommend CT abd with contrast with pancreatic protocol. D/w renal. Will obtain CT later today. CA 19-9 pending.      End stage renal disease on dialysis [facility]) Receiving scheduled dialysis per renal.  CAD Continue aspirin and Lipitor.  Diet-controlled diabetes mellitus Monitor on SSI.  Chronic systolic CHF (congestive heart failure) ([place]): Euvolemic. Volume management with dialysis.           Code Status : Full Code  Family Communication  : None at bedside  Disposition Plan  : SNF tomorrow after HD. If pancreatic mass further showsn on CT with contrast, biopsy can be arranged as outpt  Barriers For Discharge : improving symptoms. CT abd  Consults  :   Renal CCS  Procedures  :  CT abdomen and pelvis  DVT Prophylaxis  : Heparin -   Lab Results  Component Value Date   PLT 192 11/24/2015    Antibiotics  :    Anti-infectives    Start     Dose/Rate Route Frequency Ordered Stop   11/23/15 0830  metroNIDAZOLE (FLAGYL) IVPB 500 mg     500 mg 100 mL/hr over 60 Minutes Intravenous Every 8 hours 11/23/15 0814     11/23/15 0115  vancomycin (VANCOCIN) 50 mg/mL oral solution 500 mg     500 mg Oral Every 6 hours 11/23/15 0113 12/06/15 2359   11/22/15 2359  metroNIDAZOLE (FLAGYL) IVPB 500 mg  Status:  Discontinued     500 mg 100 mL/hr over 60 Minutes Intravenous Every 8 hours 11/22/15 2311 11/23/15 0113   11/22/15 2359  ciprofloxacin (CIPRO) IVPB 400 mg  Status:  Discontinued    Comments:  Cipro 400 mg IV q24h for CrCl < 30 mL/min   400 mg 200 mL/hr over 60 Minutes Intravenous Daily at bedtime 11/22/15 2332 11/23/15 0113   11/22/15 2200  piperacillin-tazobactam (ZOSYN) IVPB  2.25 g  Status:  Discontinued     2.25 g 100 mL/hr over 30 Minutes Intravenous Every 8 hours 11/22/15 1631 11/22/15 2310   11/22/15 1630  piperacillin-tazobactam (ZOSYN) IVPB 3.375 g  Status:  Discontinued     3.375 g 100 mL/hr over 30 Minutes Intravenous  Once 11/22/15 1616 11/22/15 2200        Objective:   Filed Vitals:   11/24/15 2230 11/24/15 2356 11/25/15 0550 11/25/15 0842  BP: 127/62 117/74 130/58 140/63  Pulse: 90 94 95 96  Temp: 98.4 F (36.9 C) 99.5 F (37.5 C) 99.2 F (37.3 C) 99.3 F (37.4 C)  TempSrc: Oral Oral Oral Oral  Resp: 18 16 16 17   Height:      Weight: 70 kg (154 lb 5.2 oz) 70 kg (154 lb 5.2 oz)    SpO2: 98% 96% 98% 99%    Wt Readings from Last 3 Encounters:  11/24/15 70 kg (154 lb 5.2 oz)  11/12/15 85.73 kg (189 lb)  11/11/15 85.73 kg (189 lb)     Intake/Output Summary (Last 24 hours) at 11/25/15 1620 Last data filed at 11/25/15 1510  Gross per 24 hour  Intake    740 ml  Output   1500 ml  Net   -760 ml     Physical Exam  Gen: not in distress HEENT: moist mucosa, supple neck Chest: clear b/l, no added sounds CVS: N S1&S2, no murmurs, rubs or gallop GI: soft,  ND, BS+, mild LLQ tenderness Musculoskeletal: warm, no edema     Data Review:    CBC  Recent Labs Lab 11/22/15 1955 11/23/15 0220 11/24/15 1838  WBC 9.5 8.2 12.0*  HGB 10.9* 9.2* 9.9*  HCT 34.9* 29.3* 31.5*  PLT 119* 108* 192  MCV 83.5 82.5 80.6  MCH 26.1 25.9* 25.3*  MCHC 31.2 31.4 31.4  RDW 16.5* 16.4* 16.9*  LYMPHSABS 0.6*  --   --  MONOABS 1.4*  --   --   EOSABS 0.0  --   --   BASOSABS 0.0  --   --     Chemistries   Recent Labs Lab 11/22/15 1955 11/23/15 0220 11/24/15 1838  NA 136 135 133*  K 3.5 3.4* 3.3*  CL 99* 102 101  CO2 23 20* 20*  GLUCOSE 107* 104* 135*  BUN 35* 37* 52*  CREATININE 8.69* 8.52* 10.41*  CALCIUM 8.0* 7.4* 7.9*  AST 19  --   --   ALT 10*  --   --   ALKPHOS 144*  --   --   BILITOT 1.7*  --   --     ------------------------------------------------------------------------------------------------------------------ No results for input(s): CHOL, HDL, LDLCALC, TRIG, CHOLHDL, LDLDIRECT in the last 72 hours.  Lab Results  Component Value Date   HGBA1C 6.6 07/07/2015   ------------------------------------------------------------------------------------------------------------------ No results for input(s): TSH, T4TOTAL, T3FREE, THYROIDAB in the last 72 hours.  Invalid input(s): FREET3 ------------------------------------------------------------------------------------------------------------------ No results for input(s): VITAMINB12, FOLATE, FERRITIN, TIBC, IRON, RETICCTPCT in the last 72 hours.  Coagulation profile  Recent Labs Lab 11/22/15 0010  INR 1.49    No results for input(s): DDIMER in the last 72 hours.  Cardiac Enzymes No results for input(s): CKMB, TROPONINI, MYOGLOBIN in the last 168 hours.  Invalid input(s): CK ------------------------------------------------------------------------------------------------------------------ No results found for: BNP  Inpatient Medications  Scheduled Meds: . aspirin  81 mg Oral Daily  . atorvastatin  20 mg Oral QHS  . B-complex with vitamin C  1 tablet Oral QHS  . feeding supplement (PRO-STAT SUGAR FREE 64)  30 mL Oral Daily  . gabapentin  100 mg Oral QHS  . heparin  5,000 Units Subcutaneous Q8H  . heparin  5,000 Units Dialysis Once in dialysis  . insulin aspart  0-5 Units Subcutaneous QHS  . insulin aspart  0-9 Units Subcutaneous TID WC  . metronidazole  500 mg Intravenous Q8H  . midodrine  5 mg Oral 3 times per day on Sun Tue Thu Sat   And  . midodrine  5 mg Oral 2 times per day on Mon Wed Fri  . sodium chloride flush  3 mL Intravenous Q12H  . vancomycin  500 mg Oral Q6H   Continuous Infusions:  PRN Meds:.sodium chloride, sodium chloride, acetaminophen, alteplase, alum & mag hydroxide-simeth, heparin,  HYDROcodone-acetaminophen, hydrOXYzine, lidocaine (PF), lidocaine-prilocaine, morphine injection, pentafluoroprop-tetrafluoroeth  Micro Results Recent Results (from the past 240 hour(s))  Blood Culture (routine x 2)     Status: None (Preliminary result)   Collection Time: 11/22/15  7:50 PM  Result Value Ref Range Status   Specimen Description BLOOD RIGHT ARM  Final   Special Requests BOTTLES DRAWN AEROBIC AND ANAEROBIC 5CC  Final   Culture NO GROWTH 3 DAYS  Final   Report Status PENDING  Incomplete  Blood Culture (routine x 2)     Status: None (Preliminary result)   Collection Time: 11/22/15  7:55 PM  Result Value Ref Range Status   Specimen Description BLOOD RIGHT HAND  Final   Special Requests BOTTLES DRAWN AEROBIC AND ANAEROBIC 5CC  Final   Culture NO GROWTH 3 DAYS  Final   Report Status PENDING  Incomplete  C difficile quick scan w PCR reflex     Status: Abnormal   Collection Time: 11/22/15 11:27 PM  Result Value Ref Range Status   C Diff antigen POSITIVE (A) NEGATIVE Final   C Diff toxin POSITIVE (A) NEGATIVE Final   C Diff interpretation  Positive for toxigenic C. difficile  Final    Comment: CRITICAL RESULT CALLED TO, READ BACK BY AND VERIFIED WITH: B OLDAND,RN @0100  11/23/15 MKELLY   Gastrointestinal Panel by PCR , Stool     Status: None   Collection Time: 11/22/15 11:27 PM  Result Value Ref Range Status   Campylobacter species NOT DETECTED NOT DETECTED Final   Plesimonas shigelloides NOT DETECTED NOT DETECTED Final   Salmonella species NOT DETECTED NOT DETECTED Final   Yersinia enterocolitica NOT DETECTED NOT DETECTED Final   Vibrio species NOT DETECTED NOT DETECTED Final   Vibrio cholerae NOT DETECTED NOT DETECTED Final   Enteroaggregative E coli (EAEC) NOT DETECTED NOT DETECTED Final   Enteropathogenic E coli (EPEC) NOT DETECTED NOT DETECTED Final   Enterotoxigenic E coli (ETEC) NOT DETECTED NOT DETECTED Final   Shiga like toxin producing E coli (STEC) NOT DETECTED  NOT DETECTED Final   E. coli O157 NOT DETECTED NOT DETECTED Final   Shigella/Enteroinvasive E coli (EIEC) NOT DETECTED NOT DETECTED Final   Cryptosporidium NOT DETECTED NOT DETECTED Final   Cyclospora cayetanensis NOT DETECTED NOT DETECTED Final   Entamoeba histolytica NOT DETECTED NOT DETECTED Final   Giardia lamblia NOT DETECTED NOT DETECTED Final   Adenovirus F40/41 NOT DETECTED NOT DETECTED Final   Astrovirus NOT DETECTED NOT DETECTED Final   Norovirus GI/GII NOT DETECTED NOT DETECTED Final   Rotavirus A NOT DETECTED NOT DETECTED Final   Sapovirus (I, II, IV, and V) NOT DETECTED NOT DETECTED Final  MRSA PCR Screening     Status: None   Collection Time: 11/23/15  6:22 AM  Result Value Ref Range Status   MRSA by PCR NEGATIVE NEGATIVE Final    Comment:        The GeneXpert MRSA Assay (FDA approved for NASAL specimens only), is one component of a comprehensive MRSA colonization surveillance program. It is not intended to diagnose MRSA infection nor to guide or monitor treatment for MRSA infections.     Radiology Reports Ct Abdomen Pelvis Wo Contrast  11/22/2015  CLINICAL DATA:  Abdominal pain beginning over the last 2 days. No vomiting. Recent diagnosis of abdominal blockage. EXAM: CT ABDOMEN AND PELVIS WITHOUT CONTRAST TECHNIQUE: Multidetector CT imaging of the abdomen and pelvis was performed following the standard protocol without IV contrast. COMPARISON:  CTs, 11/07/2015 and 05/03/2014. FINDINGS: Lung bases: Heart is mildly enlarged. There are dense coronary artery calcifications. There is patchy and linear lung base opacity most evident in the left lower lobe, likely atelectasis. Pneumonia is possible. No pulmonary edema. No pleural effusion. Hepatobiliary: Liver is unremarkable. Gallbladder is distended. There are dependent stones but no convincing wall thickening or pericholecystic fluid. No bile duct dilation. Spleen: Mild enlargement measuring 16.5 x 6.7 x 15.4 cm. No splenic  mass or focal lesion. Stable. Pancreas: Pancreas is thickened, particularly the pancreatic head, where it surrounds the celiac axis and superior mesenteric artery vasculature. Lower attenuation areas in the pancreatic tail suggest poorly defined cysts or an irregular dilated duct. Margins of the pancreas are ill-defined. This appearance is similar to the prior exam. Additional soft tissue attenuation extends along the gastrohepatic ligament consistent with poorly defined adenopathy. Largest presumed node measures 16 mm in short axis. Adrenal glands:  No masses. Kidneys, ureters, bladder: Marked renal parenchymal thinning. Numerous bilateral renal cysts. No hydronephrosis. Ureters normal course and in caliber. Bladder is decompressed. Prostate gland:  Mild to moderately enlarged but stable. Ascites:  None. Gastrointestinal: Mild wall thickening suggested along the  descending and sigmoid colon extending to the rectum. There is no bowel dilation to suggest obstruction. There are changes from a previous partial right colectomy, stable. Vascular: There are extensive atherosclerotic calcifications throughout the aorta and branch vessels. Musculoskeletal: Large subchondral cysts are noted along the superior acetabula. There is increased density in the bony structures consistent with renal osteodystrophy. There multiple Schmorl's nodes along the spine. Abdominal wall: Small fat containing left para midline abdominal wall hernia just below the umbilicus. IMPRESSION: 1. Abnormal appearance of the pancreas. An infiltrating pancreatic neoplasm is suspected. This could be better defined with pancreatic MRI with and without contrast if the patient can tolerate that procedure. There is evidence of gastrohepatic ligament chain adenopathy, extending along the porta hepatis. 2. Mild wall thickening of the descending colon, sigmoid colon and rectum consistent with a mild infectious or inflammatory colitis. No evidence of bowel  obstruction. 3. Stable splenomegaly. 4. Distended gallbladder with dependent gallstones but no convincing acute cholecystitis. 5. Significant renal parenchymal thinning with multiple cysts with acquired renal cystic disease. 6. Increased opacity the base of the left lower lobe, most likely atelectasis. Consider pneumonia in the proper clinical setting. Electronically Signed   By: Lajean Manes M.D.   On: 11/22/2015 19:46   Ct Abdomen Pelvis Wo Contrast  11/07/2015  CLINICAL DATA:  Abdominal pain, elevated lipase. Right upper quadrant pain. EXAM: CT ABDOMEN AND PELVIS WITHOUT CONTRAST TECHNIQUE: Multidetector CT imaging of the abdomen and pelvis was performed following the standard protocol without IV contrast. COMPARISON:  Ultrasound earlier this day.  CT 05/06/2014 FINDINGS: Lower chest: Extensive coronary artery calcifications. Calcifications at the lung hila, partially included. Probable bilateral calcified hilar lymph nodes, suboptimally assessed. Atelectasis at the lung bases. Liver: No evidence of focal lesion allowing for lack contrast. Hepatobiliary: Distended gallbladder containing dependent gallstones. Previously assessed by ultrasound. Pancreas: Suboptimally assessed without contrast, however there is diffuse soft tissue fullness. Mild peripancreatic edema. Spleen: Enlarged measuring 15.4 cm craniocaudal. Adrenal glands: Fullness bilaterally.  No discrete nodule. Kidneys: Atrophic parenchyma. Multiple bilateral renal cysts, with a large parapelvic cyst on the left. Some of the cysts appear complicated. Extensive renovascular calcifications. Stomach/Bowel: Stomach distended with ingested contents. Dilated fluid-filled proximal small bowel with transition in the mid right abdomen image 55 series 2. There is mesenteric edema and free fluid. No pneumatosis. More distal small bowel loops are decompressed. Enteric sutures noted at the base of the cecum. There is cecal and ascending colonic wall thickening.  Small volume of stool throughout the remainder the colon. Vascular/Lymphatic: Dense atherosclerosis of the abdominal aorta. No aneurysm. No definite retroperitoneal adenopathy. Reproductive: Prominent sized prostate gland causing mass effect on the bladder base. Bladder: Decompressed. Other: Small volume of free fluid in the abdomen and pelvis. No evidence of free air. Postsurgical change in the anterior abdominal wall with small fat containing periumbilical ventral abdominal wall hernia. Musculoskeletal: There are no acute or suspicious osseous abnormalities. 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 Bony fusion of the sacroiliac joints. Degenerative change throughout spine. IMPRESSION: 1. Small-bowel obstruction with transition point in the right mid abdomen. 2. Soft tissue fullness about the entire pancreas. There is mild peripancreatic edema, however diffuse mesenteric edema is also seen. Underlying pancreatic mass cannot be excluded. If patient is able tolerate breath hold technique, MRI may be of value. 3. Splenomegaly. 4. Cholelithiasis, please reference same-day right upper quadrant ultrasound. 5. Advanced atherosclerosis. 6. Additional chronic findings as described. Electronically Signed   By: Fonnie Birkenhead.D.  On: 11/07/2015 05:55   Mr Abdomen Wo Contrast  11/25/2015  CLINICAL DATA:  Evaluate pancreas mass EXAM: MRI ABDOMEN WITHOUT CONTRAST TECHNIQUE: Multiplanar multisequence MR imaging was performed without the administration of intravenous contrast. COMPARISON:  11/22/2015 FINDINGS: Exam detail diminished due to motion artifact as well as lack of IV contrast material. Lower chest:  No pleural fluid identified. Hepatobiliary: Abnormal signal of the liver in spleen are noted suggestive of hemochromatosis. No mass identified. Normal appearance of the gallbladder. No biliary dilatation identified. Pancreas: There is abnormal dilatation of the pancreatic duct from the body through  the tail. Within the head and neck of pancreas there is a and ill defined area of soft tissue infiltration which is difficult to assess due to lack of IV contrast material. This measures 5.6 cm transverse 2.9 cm AP and 5.9 cm in length. Spleen: The spleen appears enlarged measuring 16 cm in length. No focal splenic abnormality identified. Adrenals/Urinary Tract: The adrenal glands are normal. Changes of bilateral polycystic kidneys identified. Stomach/Bowel: The stomach appears normal. No pathologic dilatation of the upper abdominal bowel loops. Vascular/Lymphatic: Aortic atherosclerosis noted. No aneurysm. The portal vein is not visualized and is presumably occluded by mass. They SMA and celiac arteries have a common origin and both appear partially encased by this mass.No retroperitoneal adenopathy identified peripancreatic lymph node is enlarged measuring 1.8 cm, image 47 of series 11. Other: No significant free fluid or abnormal fluid collections identified. Musculoskeletal: There is diffuse abnormal signal throughout the bone marrow suggestive of iron overload. IMPRESSION: 1. Poorly defined mass involving the head of pancreas and obstructing the pancreatic duct is again noted. Suspicious for pancreatic adenocarcinoma. This is difficult to further characterize above what was seen on CT from 11/22/2015. This is largely a reflection of lack of IV contrast material (due to chronic kidney disease) and motion artifact. There appears to be involvement of the celiac and SMA arteries and occlusion of the portal vein. Assuming no no allergy to IV contrast material then further assessment of this lesion with pancreas protocol CT of the abdomen would be advised followed by dialysis. 2. Abnormal signal within the bone marrow, liver and spleen is suggestive of hemochromatosis. 3. Aortic atherosclerosis 4. Bilateral end-stage polycystic kidneys. Electronically Signed   By: Kerby Moors M.D.   On: 11/25/2015 11:29   Dg  Chest Port 1 View  11/22/2015  CLINICAL DATA:  Patient [facility] for post CVA post MI. Patient started having Diarrhea since Friday. Patient started having Abd pain and fever 103.1 . Hx of diabetes, stroke, HTN. EXAM: PORTABLE CHEST 1 VIEW COMPARISON:  05/03/2014 FINDINGS: Nasogastric tube has been removed. Heart is mildly enlarged. Film is made with shallow lung inflation. There is linear density and adjacent lucency at the right lung base, likely related to linear atelectasis. However it is difficult to exclude free intraperitoneal air. There is focal atelectasis or early infiltrate at the left lung base. Mild pulmonary edema is noted. IMPRESSION: 1. Question of free intraperitoneal air beneath the right hemidiaphragm. Further evaluation with left lateral decubitus view of the abdomen or CT of the abdomen and pelvis is recommended. 2. Bibasilar atelectasis or infiltrates, left greater than right. 3. Mild interstitial pulmonary edema. Electronically Signed   By: Nolon Nations M.D.   On: 11/22/2015 17:40   Dg Abd 2 Views  11/08/2015  CLINICAL DATA:  Small-bowel obstruction EXAM: ABDOMEN - 2 VIEW COMPARISON:  CT abdomen 11/07/2015 FINDINGS: Mildly dilated small bowel loops with air-fluid  levels. Gas in the transverse colon with air-fluid levels. Upright view does not include the right hemidiaphragm. This would need to be repeated if there is concern of free air. IMPRESSION: Air-fluid levels in large and small bowel may be related to ileus or resolving small bowel obstruction Right hemidiaphragm not included on the upright view. If there is concern of free air, repeat upright view could be performed. Electronically Signed   By: Franchot Gallo M.D.   On: 11/08/2015 15:01   Dg Abd Portable 2v  11/22/2015  CLINICAL DATA:  Diarrhea since Friday with history previous small-bowel obstruction. EXAM: PORTABLE ABDOMEN - 2 VIEW COMPARISON:  X-ray 11/08/2015.  CT scan 10/30/2015. FINDINGS: Right-side-up  decubitus film shows no evidence for intraperitoneal free air. Diffuse gaseous small bowel distention is noted. There is some high attenuation which appears to be in small bowel loops over the central abdomen, nonspecific. Given the central clustering of small bowel , ventral hernia would be a consideration. Bones are diffusely demineralized. , IMPRESSION: Diffuse gaseous bowel distention without evidence for intraperitoneal free air. Ileus or small-bowel obstruction could have this appearance. Central clustering of small bowel raises the question for bowel herniation into a ventral hernia which was seen on the previous CT scan. Electronically Signed   By: Misty Stanley M.D.   On: 11/22/2015 19:03   US Abdomen Limited Ruq  11/07/2015  CLINICAL DATA:  Chronic periumbilical pain for 1 month. Initial encounter. EXAM: US ABDOMEN LIMITED - RIGHT UPPER QUADRANT COMPARISON:  CT of the abdomen and pelvis from 05/03/2014 FINDINGS: Gallbladder: Stones and sludge are noted dependently within the gallbladder. The gallbladder is otherwise unremarkable. No significant gallbladder wall thickening or pericholecystic fluid is seen. No ultrasonographic Murphy's sign is elicited. Common bile duct: Diameter: 0.6 cm, within normal limits in caliber. Liver: No focal lesion identified. Within normal limits in parenchymal echogenicity. IMPRESSION: 1. No acute abnormality seen at the right upper quadrant. 2. Stones and sludge seen dependently within the gallbladder. Gallbladder otherwise unremarkable in appearance. Electronically Signed   By: Garald Balding M.D.   On: 11/07/2015 01:20    Time Spent in minutes  25   Louellen Molder M.D on 11/25/2015 at 4:20 PM  Between 7am to 7pm - Pager - [phone]  After 7pm go to [email or web address] - password [facility]  [facility] -  Office  [phone]

## 2015-11-25 NOTE — Clinical Social Work Note (Signed)
Clinical Social Work Assessment  Patient Details  Name: Sean [name] MRN: [id number] Date of Birth: [birth year]  Date of referral:  11/22/15               Reason for consult:  Facility Placement (Patient from [facility])                Permission sought to share information with:  Family Supports Permission granted to share information::  Yes, Verbal Permission Granted  Name::     Sharma Covert and [place]::     Relationship::  Son and Daughter  Contact Information:  Felipe Drone [phone] and [name] [phone]  Housing/Transportation Living arrangements for the past 2 months:  [place] of Information:  Patient Patient Interpreter Needed:  None Criminal Activity/Legal Involvement Pertinent to Current Situation/Hospitalization:  No - Comment as needed Significant Relationships:  Adult Children, Other Family Members Lives with:  Facility Resident Do you feel safe going back to the place where you live?  Yes Need for family participation in patient care:  Yes (Comment)  Care giving concerns:  None expressed by patient regarding his care at [facility].   Social Worker assessment / plan:  CSW talked with patient at the bedside regarding discharge planning and to determine if patient intends to return to [facility]. Mr. Zunich was lying in bed and was alert, oriented, very pleasant and open to talking with CSW.  Per patient, he does intend to return to [facility] and continue his rehab. Patient gave permission for CSW to contact his son Felipe Drone or his daughter [name] regarding his discharge.  Employment status:  Retired Forensic scientist:  Information systems manager, [employer]) PT Recommendations:  [place] / Referral to community resources:  [place] (Referral information not needed or requested as patient from a facility)  Patient/Family's Response to care:  No concerns expressed by  patient regarding his care during hospitalization.  Patient/Family's Understanding of and Emotional Response to Diagnosis, Current Treatment, and Prognosis: Not discussed.  Emotional Assessment Appearance:  Appears younger than stated age Attitude/Demeanor/Rapport:  Other (Appropriate) Affect (typically observed):  Pleasant, Appropriate, Calm Orientation:  Oriented to Self, Oriented to Place, Oriented to  Time, Oriented to Situation Alcohol / Substance use:  Tobacco Use (Patient reports that he quit smoke about 39 years ago and does not drink or use illicit drugs) Psych involvement (Current and /or in the community):  No (Comment)  Discharge Needs  Concerns to be addressed:  Discharge Planning Concerns Readmission within the last 30 days:  Yes Current discharge risk:  None Barriers to Discharge:  No Barriers Identified   [name], LCSW 11/25/2015, 12:03 PM

## 2015-11-25 NOTE — Care Management Note (Signed)
Case Management Note  Patient Details  Name: ANH DIERKER MRN: [id number] Date of Birth: [birth year]  Subjective/Objective:          CM following for progression and d/c planning.          Action/Plan: 11/25/2015 Noted that pt is SNF resident and plan is to return to SNF when medically stable .   Expected Discharge Date:                  Expected Discharge Plan:  [place]  In-House Referral:  Clinical Social Work  Discharge planning Services  NA  Post Acute Care Choice:  NA Choice offered to:  NA  DME Arranged:  N/A DME Agency:  NA  HH Arranged:  NA HH Agency:  NA  Status of Service:  Completed, signed off  Medicare Important Message Given:    Date Medicare IM Given:    Medicare IM give by:    Date Additional Medicare IM Given:    Additional Medicare Important Message give by:     If discussed at [place] of Stay Meetings, dates discussed:    Additional Comments:  Adron Bene, RN 11/25/2015, 2:51 PM

## 2015-11-25 NOTE — Progress Notes (Signed)
PT Cancellation Note  Patient Details Name: Sean [name] MRN: [id number] DOB: [birth year]   Cancelled Treatment:    Reason Eval/Treat Not Completed: Other (comment) (having issues with diarrhea, may try later as time allows).   Ramond Dial 11/25/2015, 1:58 PM    Mee Hives, PT MS Acute Rehab Dept. Number: [place] and [place]

## 2015-11-25 NOTE — Progress Notes (Signed)
[facility] Progress Note   Subjective: "I'm feeling more like myself. My stomach is still grumbling..." Being transported to MRI. No C/O abdominal pain at present.    Objective Filed Vitals:   11/24/15 2230 11/24/15 2356 11/25/15 0550 11/25/15 0842  BP: 127/62 117/74 130/58 140/63  Pulse: 90 94 95 96  Temp: 98.4 F (36.9 C) 99.5 F (37.5 C) 99.2 F (37.3 C) 99.3 F (37.4 C)  TempSrc: Oral Oral Oral Oral  Resp: 18 16 16 17   Height:      Weight: 70 kg (154 lb 5.2 oz) 70 kg (154 lb 5.2 oz)    SpO2: 98% 96% 98% 99%   Physical Exam General: Pleasant, cooperative, NAD Heart: S1 S2. III/VI systolic M.  Lungs: Bilateral breath sounds CTA Abdomen: distended, slightly tender to palpation, active BS.  Extremities: L BKA No RLE edema Dialysis Access:LFA AVG + bruit  Dialysis Orders: [facility] MWF 4 hrs 400/500 EDW 71 kg 2.0K/2.25 Ca UF profile 4 Linear NA L AVG Heparin 5000 units IV q treatment Mircera 75 mcg IV q 4 weeks (last dose 11/12/15 HGB 10.3 11/17/15) Hectorol 3 mcg IV q tx (last PTH 1794 10/07/15)  Additional Objective Labs: Basic Metabolic Panel:  Recent Labs Lab 11/22/15 1955 11/23/15 0220 11/24/15 1838  NA 136 135 133*  K 3.5 3.4* 3.3*  CL 99* 102 101  CO2 23 20* 20*  GLUCOSE 107* 104* 135*  BUN 35* 37* 52*  CREATININE 8.69* 8.52* 10.41*  CALCIUM 8.0* 7.4* 7.9*   Liver Function Tests:  Recent Labs Lab 11/22/15 1955  AST 19  ALT 10*  ALKPHOS 144*  BILITOT 1.7*  PROT 7.2  ALBUMIN 3.1*    Recent Labs Lab 11/22/15 1955  LIPASE 34   CBC:  Recent Labs Lab 11/22/15 1955 11/23/15 0220 11/24/15 1838  WBC 9.5 8.2 12.0*  NEUTROABS 7.5  --   --   HGB 10.9* 9.2* 9.9*  HCT 34.9* 29.3* 31.5*  MCV 83.5 82.5 80.6  PLT 119* 108* 192   Blood Culture    Component Value Date/Time   SDES BLOOD RIGHT HAND 11/22/2015 1955   SPECREQUEST BOTTLES DRAWN AEROBIC AND ANAEROBIC 5CC 11/22/2015 1955   CULT NO GROWTH 2 DAYS 11/22/2015 1955   REPTSTATUS PENDING 11/22/2015 1955    Cardiac Enzymes: No results for input(s): CKTOTAL, CKMB, CKMBINDEX, TROPONINI in the last 168 hours. CBG:  Recent Labs Lab 11/24/15 0814 11/24/15 1155 11/24/15 1657 11/25/15 11/25/15 0841  GLUCAP 110* 130* 140* 103* 116*   Iron Studies: No results for input(s): IRON, TIBC, TRANSFERRIN, FERRITIN in the last 72 hours. @lablastinr3 @ Studies/Results: No results found. Medications:   . aspirin  81 mg Oral Daily  . atorvastatin  20 mg Oral QHS  . B-complex with vitamin C  1 tablet Oral QHS  . feeding supplement (PRO-STAT SUGAR FREE 64)  30 mL Oral Daily  . gabapentin  100 mg Oral QHS  . heparin  5,000 Units Subcutaneous Q8H  . heparin  5,000 Units Dialysis Once in dialysis  . insulin aspart  0-5 Units Subcutaneous QHS  . insulin aspart  0-9 Units Subcutaneous TID WC  . metronidazole  500 mg Intravenous Q8H  . midodrine  5 mg Oral 3 times per day on Sun Tue Thu Sat   And  . midodrine  5 mg Oral 2 times per day on Mon Wed Fri  . sodium chloride flush  3 mL Intravenous Q12H  . vancomycin  500 mg Oral Q6H  Assessment/Plan: 1. C Diff per PCR: On flagyl/vanc per primary. Enteric isolation.  2. Gallstone pancreatitis: CT of abd/pelvis 06/12/17Abnormal appearance of pancreas-suspected infiltrating neoplasm suspected. For MRI today for further investigation. T bili ^ 1.7 lipase 34. For MRI today.       Surgery has been consulted-holding off cholecystectomy until acute illness resolved.  3. ESRD - MWF at [facility]. HD on schedule yesterday. Tolerated well-no issues. Next tx Friday. 4.0 K bath.  4. Hypertension/volume - BP controlled, Requires Midodrine on HD days. HD 11/24/15-Pre wt 71.8 kg Net UF 1500 Post Wt 70 kg. Now under OP EDW.   5. Anemia - HGB 9.9 recent ESA dose-follow HGB-may require re-dosing.  6. Metabolic bone disease - Last incenter phos 1.8 11/17/15. Stop binders, Cont VDRA 7. Nutrition - Albumin 3.1. NPO at present for MRI. Has  been on heart healthy/Carb mod diet. K+ 3.4 Will leave for now. Nepro/renal vit. Poor appetite-probably has lost wt.  8. DM: Per primary 9. Chronic systolic CHF: Volume stable per primary   Rita H. Owens Shark, NP-C 11/24/2015, 10:57 AM  [facility] Beeper [phone]  Pt seen, examined and agree w A/P as above.  [name] MD [employer] pager [phone]    cell [phone] 11/25/2015, 12:07 PM

## 2015-11-26 DIAGNOSIS — K8689 Other specified diseases of pancreas: Secondary | ICD-10-CM

## 2015-11-26 DIAGNOSIS — K921 Melena: Secondary | ICD-10-CM

## 2015-11-26 DIAGNOSIS — K920 Hematemesis: Secondary | ICD-10-CM | POA: Insufficient documentation

## 2015-11-26 LAB — CBC
HCT: 27.5 % — ABNORMAL LOW (ref 39.0–52.0)
Hemoglobin: 8.7 g/dL — ABNORMAL LOW (ref 13.0–17.0)
MCH: 26 pg (ref 26.0–34.0)
MCHC: 31.6 g/dL (ref 30.0–36.0)
MCV: 82.1 fL (ref 78.0–100.0)
Platelets: 205 10*3/uL (ref 150–400)
RBC: 3.35 MIL/uL — ABNORMAL LOW (ref 4.22–5.81)
RDW: 17.5 % — ABNORMAL HIGH (ref 11.5–15.5)
WBC: 9.4 10*3/uL (ref 4.0–10.5)

## 2015-11-26 LAB — RENAL FUNCTION PANEL
Albumin: 2.5 g/dL — ABNORMAL LOW (ref 3.5–5.0)
Anion gap: 14 (ref 5–15)
BUN: 47 mg/dL — ABNORMAL HIGH (ref 6–20)
CO2: 22 mmol/L (ref 22–32)
Calcium: 7.9 mg/dL — ABNORMAL LOW (ref 8.9–10.3)
Chloride: 99 mmol/L — ABNORMAL LOW (ref 101–111)
Creatinine, Ser: 7.09 mg/dL — ABNORMAL HIGH (ref 0.61–1.24)
GFR calc Af Amer: 8 mL/min — ABNORMAL LOW (ref >60)
GFR calc non Af Amer: 7 mL/min — ABNORMAL LOW (ref >60)
Glucose, Bld: 197 mg/dL — ABNORMAL HIGH (ref 65–99)
Phosphorus: 1.5 mg/dL — ABNORMAL LOW (ref 2.5–4.6)
Potassium: 3.1 mmol/L — ABNORMAL LOW (ref 3.5–5.1)
Sodium: 135 mmol/L (ref 135–145)

## 2015-11-26 LAB — GLUCOSE, CAPILLARY
GLUCOSE-CAPILLARY: 171 mg/dL — AB (ref 65–99)
GLUCOSE-CAPILLARY: 146 mg/dL — AB (ref 65–99)
Glucose-Capillary: 219 mg/dL — ABNORMAL HIGH (ref 65–99)
Glucose-Capillary: 171 mg/dL — ABNORMAL HIGH (ref 65–99)

## 2015-11-26 LAB — CANCER ANTIGEN 19-9
CA 19 9: 2662 U/mL — AB (ref 0–35)
CA 19-9: 2918 U/mL — ABNORMAL HIGH (ref 0–35)

## 2015-11-26 LAB — HEMOGLOBIN: HEMOGLOBIN: 7.8 g/dL — AB (ref 13.0–17.0)

## 2015-11-26 LAB — PREPARE RBC (CROSSMATCH)

## 2015-11-26 MED ORDER — PANTOPRAZOLE SODIUM 40 MG IV SOLR: 40.0000 mg | Freq: Two times a day (BID) | INTRAVENOUS | Status: DC

## 2015-11-26 MED ORDER — SODIUM CHLORIDE 0.9 % IV SOLN
8.0000 mg/h | INTRAVENOUS | Status: DC
  Administered 2015-11-26 – 2015-11-29 (×4): 8 mg/h via INTRAVENOUS
  Filled 2015-11-26 (×13): qty 80

## 2015-11-26 MED ORDER — HYDROCODONE-ACETAMINOPHEN 10-325 MG PO TABS: ORAL_TABLET | ORAL | Status: DC

## 2015-11-26 MED ORDER — VANCOMYCIN 50 MG/ML ORAL SOLUTION: 500.0000 mg | Freq: Four times a day (QID) | ORAL | Status: AC

## 2015-11-26 MED ORDER — HEPARIN SODIUM (PORCINE) 1000 UNIT/ML DIALYSIS
5000.0000 [IU] | Freq: Once | INTRAMUSCULAR | Status: DC
  Filled 2015-11-26: qty 5

## 2015-11-26 MED ORDER — SODIUM CHLORIDE 0.9 % IV SOLN: 100.0000 mL | INTRAVENOUS | Status: DC | PRN

## 2015-11-26 MED ORDER — SODIUM CHLORIDE 0.9 % IV SOLN
INTRAVENOUS | Status: DC
  Administered 2015-11-29: 500 mL via INTRAVENOUS

## 2015-11-26 MED ORDER — SODIUM CHLORIDE 0.9 % IV SOLN
80.0000 mg | Freq: Once | INTRAVENOUS | Status: AC
  Administered 2015-11-26: 80 mg via INTRAVENOUS
  Filled 2015-11-26: qty 80

## 2015-11-26 MED ORDER — METRONIDAZOLE 500 MG PO TABS: 500.0000 mg | ORAL_TABLET | Freq: Three times a day (TID) | ORAL | Status: AC

## 2015-11-26 NOTE — Discharge Summary (Addendum)
Physician Discharge Summary  Sean [name] [id number] DOB: 1935-09-10 DOA: 11/22/2015  PCP: [name], MD  Admit date: 11/22/2015 Discharge date: 11/26/2015  Admitted From: SN[name]) Disposition:  SNF  Recommendations for Outpatient Follow-up:  #1 Follow-up with M.D. at SNF in 1 week. Stop date of antibiotics a 12/06/2015. If patient agrees he will need CT of the abdomen with contrast (pancreatic protocol) to evaluate the pancreatic mass better and obtain pancreatic tissue biopsy by IR. #2 Follow-up with [place] [facility] in 2 weeks for cholecystectomy.    Discharge Condition: Stable CODE STATUS: Full code Diet recommendation: Heart Healthy / Carb Modified / renal    Discharge Diagnoses:  Principal Problem:   C. difficile colitis   Active Problems:   Pancreatic mass     Sepsis ([place])   Anxiety state   End stage renal disease on dialysis ([place])   CAD (coronary artery disease), native coronary artery   Secondary hyperparathyroidism (HCC)   Depression   S/P AKA (above knee amputation) unilateral (HCC)   Cholelithiasis   Ischemic colitis ([place])   Type 2 diabetes mellitus with chronic kidney disease on chronic dialysis, without long-term current use of insulin (HCC)   PAD (peripheral artery disease) (HCC)   Chronic systolic CHF (congestive heart failure) ([place])    Brief Narrative  80 year old male with history of ischemic colitis status post bowel resection, cholelithiasis, recent hospitalization for gallstone pancreatitis, ESRD on dialysis (M, W, F), status post left AKA, hypotension, anemia, anxiety and depression, diet-controlled diabetes mellitus presented with abdominal pain as stated with fever and diarrhea for the past 3 days. Reported 6-7 bowel movements with loose stools with periumbilical pain, [id number] in severe at the nonradiating. Reported nausea but no vomiting. He reported having fever of 103F at home.  In the ED stool first C. difficile was  positive, had mildly elevated lactic acid and had fever of 9 and 0.57F. Creatinine was 8.69. CT abdomen pelvis done in the ED showed mild infectious versus inflammatory colitis of the descending, sigmoid colon and rectum. Also showed suspicious infiltrating pancreatic neoplasm.   Principal Problem:  Sepsis with C. difficile colitis Sepsis resolved. On oral vancomycin and Flagyl. Clinically improving. No further diarrhea this morning. Will discharge him on both Flagyl and vancomycin to complete total 2 weeks of antibiotics.  Active Problems: Recent history of gallstone pancreatitis. Was scheduled for cholecystectomy on 6/14. Surgery was consulted who recommended holding off until acute illness had resolved. No clinical signs or symptoms of acute pancreatitis. Should follow-up with surgery in 2 weeks.  ? Pancreatic mass. Seen on admission CT (was seen on CT of the abdomen few weeks back when he was admitted but without clear view due to underlying pancreatitis). MRI without contrast done shows poorly defined mass over head of pancreas and obstructive pancreatic duct suspicious for adenocarcinoma. Possible involvement of SMA , celiac arteries and occlusion of portal vein. Recommend CT abd with contrast with pancreatic protocol.  CA 19-9 markedly elevated to 2400. Order for CT abdomen but patient refused stating he does not want any imaging or procedure done at this time. I extending in detail about the importance of getting a proper diagnosis and obtaining a tissue for confirmation. Also emphasize that without obtaining confirmatory diagnosis and evaluating for treatment options, if it is pancreatic cancer he will likely have a very poor prognosis. Patient understands stating that he is close to 55 -year-old and does not want any tests done at this time. I spoke with  patient's son Lynnell Chad on the phone in detail. He understands that patient is hard to be convinced once he decides on something. I also  informed his daughter [name] on the phone. I will inform the M.D. at SNF to speak with him on all obtaining repeat imaging and biopsy again.     End stage renal disease on dialysis [facility]) Received scheduled dialysis. No issues.  CAD Continue aspirin and Lipitor.  Diet-controlled diabetes mellitus Monitor on SSI.  Chronic systolic CHF (congestive heart failure) ([place]): Euvolemic. Volume management with dialysis.           Code Status : Full Code  Family Communication : Spoke with son and daughter on the phone  Disposition Plan : SNF    Consults :  Renal CCS  Procedures :  CT abdomen and pelvis MRI abdomen without contrast  Discharge Instructions     Medication List    STOP taking these medications        loperamide 2 MG capsule  Commonly known as:  IMODIUM      TAKE these medications        acetaminophen 325 MG tablet  Commonly known as:  TYLENOL  Take 650 mg by mouth every 4 (four) hours as needed for mild pain, fever or headache (DO NOT EXCEED 4 GM OF APAP IN 24 HOURS FORM ALL SOURCES).     aspirin 81 MG chewable tablet  Chew 81 mg by mouth daily.     atorvastatin 20 MG tablet  Commonly known as:  LIPITOR  Take 20 mg by mouth at bedtime.     b-complex w/C Tabs tablet  Take 1 tablet by mouth at bedtime.     docusate sodium 100 MG capsule  Commonly known as:  COLACE  Take 100 mg by mouth daily.     feeding supplement (PRO-STAT SUGAR FREE 64) Liqd  Take 30 mLs by mouth daily.     gabapentin 100 MG capsule  Commonly known as:  NEURONTIN  Take 100 mg by mouth at bedtime.     HYDROcodone-acetaminophen 10-325 MG tablet  Commonly known as:  NORCO  Take one tablet by mouth every 8 hours as needed for moderate pain. Do not exceed 4gm of Tylenol in 24 hours     metroNIDAZOLE 500 MG tablet  Commonly known as:  FLAGYL  Take 1 tablet (500 mg total) by mouth 3 (three) times daily.     midodrine 5 MG tablet  Commonly known as:  PROAMATINE   Take 5 mg by mouth 3 (three) times daily. Do not take 6 am dose on M- W- F- d/t  dialysis days.     sevelamer carbonate 800 MG tablet  Commonly known as:  RENVELA  Take 800 mg by mouth daily with lunch. 12 pm and 5 pm.     sorbitol 70 % solution  Take 30 mLs by mouth daily as needed (severe constipation).     vancomycin 50 mg/mL oral solution  Commonly known as:  VANCOCIN  Take 10 mLs (500 mg total) by mouth every 6 (six) hours.     ZOFRAN 4 MG tablet  Generic drug:  ondansetron  Take 4 mg by mouth every 6 (six) hours as needed for nausea.           Follow-up Information    Follow up with Gayland Curry, MD. Schedule an appointment as soon as possible for a visit in 2 weeks.   Specialty:  General Surgery   Why:  MD at [facility]   Contact information:   [address] [place] [address] [phone]      Allergies  Allergen Reactions  . Benazepril Hcl Hives, Itching and Rash    flareups on patient head     Procedures/Studies: Ct Abdomen Pelvis Wo Contrast  11/22/2015  CLINICAL DATA:  Abdominal pain beginning over the last 2 days. No vomiting. Recent diagnosis of abdominal blockage. EXAM: CT ABDOMEN AND PELVIS WITHOUT CONTRAST TECHNIQUE: Multidetector CT imaging of the abdomen and pelvis was performed following the standard protocol without IV contrast. COMPARISON:  CTs, 11/07/2015 and 05/03/2014. FINDINGS: Lung bases: Heart is mildly enlarged. There are dense coronary artery calcifications. There is patchy and linear lung base opacity most evident in the left lower lobe, likely atelectasis. Pneumonia is possible. No pulmonary edema. No pleural effusion. Hepatobiliary: Liver is unremarkable. Gallbladder is distended. There are dependent stones but no convincing wall thickening or pericholecystic fluid. No bile duct dilation. Spleen: Mild enlargement measuring 16.5 x 6.7 x 15.4 cm. No splenic mass or focal lesion. Stable. Pancreas: Pancreas is thickened, particularly the  pancreatic head, where it surrounds the celiac axis and superior mesenteric artery vasculature. Lower attenuation areas in the pancreatic tail suggest poorly defined cysts or an irregular dilated duct. Margins of the pancreas are ill-defined. This appearance is similar to the prior exam. Additional soft tissue attenuation extends along the gastrohepatic ligament consistent with poorly defined adenopathy. Largest presumed node measures 16 mm in short axis. Adrenal glands:  No masses. Kidneys, ureters, bladder: Marked renal parenchymal thinning. Numerous bilateral renal cysts. No hydronephrosis. Ureters normal course and in caliber. Bladder is decompressed. Prostate gland:  Mild to moderately enlarged but stable. Ascites:  None. Gastrointestinal: Mild wall thickening suggested along the descending and sigmoid colon extending to the rectum. There is no bowel dilation to suggest obstruction. There are changes from a previous partial right colectomy, stable. Vascular: There are extensive atherosclerotic calcifications throughout the aorta and branch vessels. Musculoskeletal: Large subchondral cysts are noted along the superior acetabula. There is increased density in the bony structures consistent with renal osteodystrophy. There multiple Schmorl's nodes along the spine. Abdominal wall: Small fat containing left para midline abdominal wall hernia just below the umbilicus. IMPRESSION: 1. Abnormal appearance of the pancreas. An infiltrating pancreatic neoplasm is suspected. This could be better defined with pancreatic MRI with and without contrast if the patient can tolerate that procedure. There is evidence of gastrohepatic ligament chain adenopathy, extending along the porta hepatis. 2. Mild wall thickening of the descending colon, sigmoid colon and rectum consistent with a mild infectious or inflammatory colitis. No evidence of bowel obstruction. 3. Stable splenomegaly. 4. Distended gallbladder with dependent gallstones  but no convincing acute cholecystitis. 5. Significant renal parenchymal thinning with multiple cysts with acquired renal cystic disease. 6. Increased opacity the base of the left lower lobe, most likely atelectasis. Consider pneumonia in the proper clinical setting. Electronically Signed   By: Lajean Manes M.D.   On: 11/22/2015 19:46   Ct Abdomen Pelvis Wo Contrast  11/07/2015  CLINICAL DATA:  Abdominal pain, elevated lipase. Right upper quadrant pain. EXAM: CT ABDOMEN AND PELVIS WITHOUT CONTRAST TECHNIQUE: Multidetector CT imaging of the abdomen and pelvis was performed following the standard protocol without IV contrast. COMPARISON:  Ultrasound earlier this day.  CT 05/06/2014 FINDINGS: Lower chest: Extensive coronary artery calcifications. Calcifications at the lung hila, partially included. Probable bilateral calcified hilar lymph nodes, suboptimally assessed. Atelectasis at the lung bases. Liver:  No evidence of focal lesion allowing for lack contrast. Hepatobiliary: Distended gallbladder containing dependent gallstones. Previously assessed by ultrasound. Pancreas: Suboptimally assessed without contrast, however there is diffuse soft tissue fullness. Mild peripancreatic edema. Spleen: Enlarged measuring 15.4 cm craniocaudal. Adrenal glands: Fullness bilaterally.  No discrete nodule. Kidneys: Atrophic parenchyma. Multiple bilateral renal cysts, with a large parapelvic cyst on the left. Some of the cysts appear complicated. Extensive renovascular calcifications. Stomach/Bowel: Stomach distended with ingested contents. Dilated fluid-filled proximal small bowel with transition in the mid right abdomen image 55 series 2. There is mesenteric edema and free fluid. No pneumatosis. More distal small bowel loops are decompressed. Enteric sutures noted at the base of the cecum. There is cecal and ascending colonic wall thickening. Small volume of stool throughout the remainder the colon. Vascular/Lymphatic: Dense  atherosclerosis of the abdominal aorta. No aneurysm. No definite retroperitoneal adenopathy. Reproductive: Prominent sized prostate gland causing mass effect on the bladder base. Bladder: Decompressed. Other: Small volume of free fluid in the abdomen and pelvis. No evidence of free air. Postsurgical change in the anterior abdominal wall with small fat containing periumbilical ventral abdominal wall hernia. Musculoskeletal: There are no acute or suspicious osseous abnormalities. 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 Bony fusion of the sacroiliac joints. Degenerative change throughout spine. IMPRESSION: 1. Small-bowel obstruction with transition point in the right mid abdomen. 2. Soft tissue fullness about the entire pancreas. There is mild peripancreatic edema, however diffuse mesenteric edema is also seen. Underlying pancreatic mass cannot be excluded. If patient is able tolerate breath hold technique, MRI may be of value. 3. Splenomegaly. 4. Cholelithiasis, please reference same-day right upper quadrant ultrasound. 5. Advanced atherosclerosis. 6. Additional chronic findings as described. Electronically Signed   By: Jeb Levering M.D.   On: 11/07/2015 05:55   Mr Abdomen Wo Contrast  11/25/2015  CLINICAL DATA:  Evaluate pancreas mass EXAM: MRI ABDOMEN WITHOUT CONTRAST TECHNIQUE: Multiplanar multisequence MR imaging was performed without the administration of intravenous contrast. COMPARISON:  11/22/2015 FINDINGS: Exam detail diminished due to motion artifact as well as lack of IV contrast material. Lower chest:  No pleural fluid identified. Hepatobiliary: Abnormal signal of the liver in spleen are noted suggestive of hemochromatosis. No mass identified. Normal appearance of the gallbladder. No biliary dilatation identified. Pancreas: There is abnormal dilatation of the pancreatic duct from the body through the tail. Within the head and neck of pancreas there is a and ill defined area of  soft tissue infiltration which is difficult to assess due to lack of IV contrast material. This measures 5.6 cm transverse 2.9 cm AP and 5.9 cm in length. Spleen: The spleen appears enlarged measuring 16 cm in length. No focal splenic abnormality identified. Adrenals/Urinary Tract: The adrenal glands are normal. Changes of bilateral polycystic kidneys identified. Stomach/Bowel: The stomach appears normal. No pathologic dilatation of the upper abdominal bowel loops. Vascular/Lymphatic: Aortic atherosclerosis noted. No aneurysm. The portal vein is not visualized and is presumably occluded by mass. They SMA and celiac arteries have a common origin and both appear partially encased by this mass.No retroperitoneal adenopathy identified peripancreatic lymph node is enlarged measuring 1.8 cm, image 47 of series 11. Other: No significant free fluid or abnormal fluid collections identified. Musculoskeletal: There is diffuse abnormal signal throughout the bone marrow suggestive of iron overload. IMPRESSION: 1. Poorly defined mass involving the head of pancreas and obstructing the pancreatic duct is again noted. Suspicious for pancreatic adenocarcinoma. This is difficult to further characterize above what was seen on  CT from 11/22/2015. This is largely a reflection of lack of IV contrast material (due to chronic kidney disease) and motion artifact. There appears to be involvement of the celiac and SMA arteries and occlusion of the portal vein. Assuming no no allergy to IV contrast material then further assessment of this lesion with pancreas protocol CT of the abdomen would be advised followed by dialysis. 2. Abnormal signal within the bone marrow, liver and spleen is suggestive of hemochromatosis. 3. Aortic atherosclerosis 4. Bilateral end-stage polycystic kidneys. Electronically Signed   By: Kerby Moors M.D.   On: 11/25/2015 11:29   Dg Chest Port 1 View  11/22/2015  CLINICAL DATA:  Patient comes from [facility] for  post CVA post MI. Patient started having Diarrhea since Friday. Patient started having Abd pain and fever 103.1 . Hx of diabetes, stroke, HTN. EXAM: PORTABLE CHEST 1 VIEW COMPARISON:  05/03/2014 FINDINGS: Nasogastric tube has been removed. Heart is mildly enlarged. Film is made with shallow lung inflation. There is linear density and adjacent lucency at the right lung base, likely related to linear atelectasis. However it is difficult to exclude free intraperitoneal air. There is focal atelectasis or early infiltrate at the left lung base. Mild pulmonary edema is noted. IMPRESSION: 1. Question of free intraperitoneal air beneath the right hemidiaphragm. Further evaluation with left lateral decubitus view of the abdomen or CT of the abdomen and pelvis is recommended. 2. Bibasilar atelectasis or infiltrates, left greater than right. 3. Mild interstitial pulmonary edema. Electronically Signed   By: Nolon Nations M.D.   On: 11/22/2015 17:40   Dg Abd 2 Views  11/08/2015  CLINICAL DATA:  Small-bowel obstruction EXAM: ABDOMEN - 2 VIEW COMPARISON:  CT abdomen 11/07/2015 FINDINGS: Mildly dilated small bowel loops with air-fluid levels. Gas in the transverse colon with air-fluid levels. Upright view does not include the right hemidiaphragm. This would need to be repeated if there is concern of free air. IMPRESSION: Air-fluid levels in large and small bowel may be related to ileus or resolving small bowel obstruction Right hemidiaphragm not included on the upright view. If there is concern of free air, repeat upright view could be performed. Electronically Signed   By: Franchot Gallo M.D.   On: 11/08/2015 15:01   Dg Abd Portable 2v  11/22/2015  CLINICAL DATA:  Diarrhea since Friday with history previous small-bowel obstruction. EXAM: PORTABLE ABDOMEN - 2 VIEW COMPARISON:  X-ray 11/08/2015.  CT scan 10/30/2015. FINDINGS: Right-side-up decubitus film shows no evidence for intraperitoneal free air. Diffuse gaseous small  bowel distention is noted. There is some high attenuation which appears to be in small bowel loops over the central abdomen, nonspecific. Given the central clustering of small bowel , ventral hernia would be a consideration. Bones are diffusely demineralized. , IMPRESSION: Diffuse gaseous bowel distention without evidence for intraperitoneal free air. Ileus or small-bowel obstruction could have this appearance. Central clustering of small bowel raises the question for bowel herniation into a ventral hernia which was seen on the previous CT scan. Electronically Signed   By: Misty Stanley M.D.   On: 11/22/2015 19:03   US Abdomen Limited Ruq  11/07/2015  CLINICAL DATA:  Chronic periumbilical pain for 1 month. Initial encounter. EXAM: US ABDOMEN LIMITED - RIGHT UPPER QUADRANT COMPARISON:  CT of the abdomen and pelvis from 05/03/2014 FINDINGS: Gallbladder: Stones and sludge are noted dependently within the gallbladder. The gallbladder is otherwise unremarkable. No significant gallbladder wall thickening or pericholecystic fluid is seen. No ultrasonographic Murphy's  sign is elicited. Common bile duct: Diameter: 0.6 cm, within normal limits in caliber. Liver: No focal lesion identified. Within normal limits in parenchymal echogenicity. IMPRESSION: 1. No acute abnormality seen at the right upper quadrant. 2. Stones and sludge seen dependently within the gallbladder. Gallbladder otherwise unremarkable in appearance. Electronically Signed   By: Garald Balding M.D.   On: 11/07/2015 01:20    (Echo, Carotid, EGD, Colonoscopy, ERCP)    Subjective:   Discharge Exam: Filed Vitals:   11/26/15 0514 11/26/15 0914  BP: 139/59 134/69  Pulse: 100 101  Temp: 98.4 F (36.9 C) 97.9 F (36.6 C)  Resp: 20 18   Filed Vitals:   11/25/15 1726 11/25/15 1957 11/26/15 0514 11/26/15 0914  BP: 133/57 112/54 139/59 134/69  Pulse: 102 95 100 101  Temp: 99.1 F (37.3 C) 99.1 F (37.3 C) 98.4 F (36.9 C) 97.9 F (36.6 C)   TempSrc: Oral Oral Oral Oral  Resp: 18 18 20 18   Height:      Weight:   68.6 kg (151 lb 3.8 oz)   SpO2: 100% 97% 99% 100%    General: Pt is alert, awake, not in acute distress Cardiovascular: RRR, S1/S2 +, no rubs, no gallops Respiratory: CTA bilaterally, no wheezing, no rhonchi Abdominal: Soft, NT, ND, bowel sounds + Extremities: no edema, no cyanosis    The results of significant diagnostics from this hospitalization (including imaging, microbiology, ancillary and laboratory) are listed below for reference.     Microbiology: Recent Results (from the past 240 hour(s))  Blood Culture (routine x 2)     Status: None (Preliminary result)   Collection Time: 11/22/15  7:50 PM  Result Value Ref Range Status   Specimen Description BLOOD RIGHT ARM  Final   Special Requests BOTTLES DRAWN AEROBIC AND ANAEROBIC 5CC  Final   Culture NO GROWTH 3 DAYS  Final   Report Status PENDING  Incomplete  Blood Culture (routine x 2)     Status: None (Preliminary result)   Collection Time: 11/22/15  7:55 PM  Result Value Ref Range Status   Specimen Description BLOOD RIGHT HAND  Final   Special Requests BOTTLES DRAWN AEROBIC AND ANAEROBIC 5CC  Final   Culture NO GROWTH 3 DAYS  Final   Report Status PENDING  Incomplete  C difficile quick scan w PCR reflex     Status: Abnormal   Collection Time: 11/22/15 11:27 PM  Result Value Ref Range Status   C Diff antigen POSITIVE (A) NEGATIVE Final   C Diff toxin POSITIVE (A) NEGATIVE Final   C Diff interpretation Positive for toxigenic C. difficile  Final    Comment: CRITICAL RESULT CALLED TO, READ BACK BY AND VERIFIED WITH: B OLDAND,RN @0100  11/23/15 MKELLY   Gastrointestinal Panel by PCR , Stool     Status: None   Collection Time: 11/22/15 11:27 PM  Result Value Ref Range Status   Campylobacter species NOT DETECTED NOT DETECTED Final   Plesimonas shigelloides NOT DETECTED NOT DETECTED Final   Salmonella species NOT DETECTED NOT DETECTED Final    Yersinia enterocolitica NOT DETECTED NOT DETECTED Final   Vibrio species NOT DETECTED NOT DETECTED Final   Vibrio cholerae NOT DETECTED NOT DETECTED Final   Enteroaggregative E coli (EAEC) NOT DETECTED NOT DETECTED Final   Enteropathogenic E coli (EPEC) NOT DETECTED NOT DETECTED Final   Enterotoxigenic E coli (ETEC) NOT DETECTED NOT DETECTED Final   Shiga like toxin producing E coli (STEC) NOT DETECTED NOT DETECTED Final  E. coli O157 NOT DETECTED NOT DETECTED Final   Shigella/Enteroinvasive E coli (EIEC) NOT DETECTED NOT DETECTED Final   Cryptosporidium NOT DETECTED NOT DETECTED Final   Cyclospora cayetanensis NOT DETECTED NOT DETECTED Final   Entamoeba histolytica NOT DETECTED NOT DETECTED Final   Giardia lamblia NOT DETECTED NOT DETECTED Final   Adenovirus F40/41 NOT DETECTED NOT DETECTED Final   Astrovirus NOT DETECTED NOT DETECTED Final   Norovirus GI/GII NOT DETECTED NOT DETECTED Final   Rotavirus A NOT DETECTED NOT DETECTED Final   Sapovirus (I, II, IV, and V) NOT DETECTED NOT DETECTED Final  MRSA PCR Screening     Status: None   Collection Time: 11/23/15  6:22 AM  Result Value Ref Range Status   MRSA by PCR NEGATIVE NEGATIVE Final    Comment:        The GeneXpert MRSA Assay (FDA approved for NASAL specimens only), is one component of a comprehensive MRSA colonization surveillance program. It is not intended to diagnose MRSA infection nor to guide or monitor treatment for MRSA infections.      Labs: BNP (last 3 results) No results for input(s): BNP in the last 8760 hours. Basic Metabolic Panel:  Recent Labs Lab 11/22/15 1955 11/23/15 0220 11/24/15 1838  NA 136 135 133*  K 3.5 3.4* 3.3*  CL 99* 102 101  CO2 23 20* 20*  GLUCOSE 107* 104* 135*  BUN 35* 37* 52*  CREATININE 8.69* 8.52* 10.41*  CALCIUM 8.0* 7.4* 7.9*   Liver Function Tests:  Recent Labs Lab 11/22/15 1955  AST 19  ALT 10*  ALKPHOS 144*  BILITOT 1.7*  PROT 7.2  ALBUMIN 3.1*    Recent  Labs Lab 11/22/15 1955  LIPASE 34   No results for input(s): AMMONIA in the last 168 hours. CBC:  Recent Labs Lab 11/22/15 1955 11/23/15 0220 11/24/15 1838  WBC 9.5 8.2 12.0*  NEUTROABS 7.5  --   --   HGB 10.9* 9.2* 9.9*  HCT 34.9* 29.3* 31.5*  MCV 83.5 82.5 80.6  PLT 119* 108* 192   Cardiac Enzymes: No results for input(s): CKTOTAL, CKMB, CKMBINDEX, TROPONINI in the last 168 hours. BNP: Invalid input(s): POCBNP CBG:  Recent Labs Lab 11/25/15 1152 11/25/15 1724 11/25/15 2040 11/26/15 0908 11/26/15 1153  GLUCAP 107* 142* 124* 171* 219*   D-Dimer No results for input(s): DDIMER in the last 72 hours. Hgb A1c No results for input(s): HGBA1C in the last 72 hours. Lipid Profile No results for input(s): CHOL, HDL, LDLCALC, TRIG, CHOLHDL, LDLDIRECT in the last 72 hours. Thyroid function studies No results for input(s): TSH, T4TOTAL, T3FREE, THYROIDAB in the last 72 hours.  Invalid input(s): FREET3 Anemia work up No results for input(s): VITAMINB12, FOLATE, FERRITIN, TIBC, IRON, RETICCTPCT in the last 72 hours. Urinalysis No results found for: COLORURINE, APPEARANCEUR, [place], [place], [place], [place], [place], [place], PROTEINUR, UROBILINOGEN, NITRITE, LEUKOCYTESUR Sepsis Labs Invalid input(s): PROCALCITONIN,  WBC,  LACTICIDVEN Microbiology Recent Results (from the past 240 hour(s))  Blood Culture (routine x 2)     Status: None (Preliminary result)   Collection Time: 11/22/15  7:50 PM  Result Value Ref Range Status   Specimen Description BLOOD RIGHT ARM  Final   Special Requests BOTTLES DRAWN AEROBIC AND ANAEROBIC 5CC  Final   Culture NO GROWTH 3 DAYS  Final   Report Status PENDING  Incomplete  Blood Culture (routine x 2)     Status: None (Preliminary result)   Collection Time: 11/22/15  7:55 PM  Result Value  Ref Range Status   Specimen Description BLOOD RIGHT HAND  Final   Special Requests BOTTLES DRAWN AEROBIC AND ANAEROBIC 5CC  Final   Culture NO  GROWTH 3 DAYS  Final   Report Status PENDING  Incomplete  C difficile quick scan w PCR reflex     Status: Abnormal   Collection Time: 11/22/15 11:27 PM  Result Value Ref Range Status   C Diff antigen POSITIVE (A) NEGATIVE Final   C Diff toxin POSITIVE (A) NEGATIVE Final   C Diff interpretation Positive for toxigenic C. difficile  Final    Comment: CRITICAL RESULT CALLED TO, READ BACK BY AND VERIFIED WITH: B OLDAND,RN @0100  11/23/15 MKELLY   Gastrointestinal Panel by PCR , Stool     Status: None   Collection Time: 11/22/15 11:27 PM  Result Value Ref Range Status   Campylobacter species NOT DETECTED NOT DETECTED Final   Plesimonas shigelloides NOT DETECTED NOT DETECTED Final   Salmonella species NOT DETECTED NOT DETECTED Final   Yersinia enterocolitica NOT DETECTED NOT DETECTED Final   Vibrio species NOT DETECTED NOT DETECTED Final   Vibrio cholerae NOT DETECTED NOT DETECTED Final   Enteroaggregative E coli (EAEC) NOT DETECTED NOT DETECTED Final   Enteropathogenic E coli (EPEC) NOT DETECTED NOT DETECTED Final   Enterotoxigenic E coli (ETEC) NOT DETECTED NOT DETECTED Final   Shiga like toxin producing E coli (STEC) NOT DETECTED NOT DETECTED Final   E. coli O157 NOT DETECTED NOT DETECTED Final   Shigella/Enteroinvasive E coli (EIEC) NOT DETECTED NOT DETECTED Final   Cryptosporidium NOT DETECTED NOT DETECTED Final   Cyclospora cayetanensis NOT DETECTED NOT DETECTED Final   Entamoeba histolytica NOT DETECTED NOT DETECTED Final   Giardia lamblia NOT DETECTED NOT DETECTED Final   Adenovirus F40/41 NOT DETECTED NOT DETECTED Final   Astrovirus NOT DETECTED NOT DETECTED Final   Norovirus GI/GII NOT DETECTED NOT DETECTED Final   Rotavirus A NOT DETECTED NOT DETECTED Final   Sapovirus (I, II, IV, and V) NOT DETECTED NOT DETECTED Final  MRSA PCR Screening     Status: None   Collection Time: 11/23/15  6:22 AM  Result Value Ref Range Status   MRSA by PCR NEGATIVE NEGATIVE Final    Comment:         The GeneXpert MRSA Assay (FDA approved for NASAL specimens only), is one component of a comprehensive MRSA colonization surveillance program. It is not intended to diagnose MRSA infection nor to guide or monitor treatment for MRSA infections.      Time coordinating discharge: Over 30 minutes  SIGNED:   Louellen Molder, MD  [facility] 11/26/2015, 12:01 PM Pager   If 7PM-7AM, please contact night-coverage [email or web address] Password [email or web address]

## 2015-11-26 NOTE — Progress Notes (Signed)
Dialysis treatment completed.  200 mL ultrafiltrated and net fluid removal -300 mL.    Patient status unchanged. Lung sounds clear to ausculation in all fields. Generalized edema. Cardiac: ST.  Disconnected lines and removed needles.  Pressure held for 10 minutes and band aid/gauze dressing applied.  Report given to bedside RN, Lurena Joiner.   Treatment discontinued with 3 hours and 10 minutes remaining due to multiple coffee ground emesis.  Nephrologist notified and verbal order via telephone received to return blood and discontinue treatment.  Lurena Joiner, RN notified of patient condition.  Patient had 452mL total of dark brown coffee emesis.

## 2015-11-26 NOTE — Care Management Important Message (Signed)
Important Message  Patient Details  Name: Sean [name] MRN: [id number] Date of Birth: [birth year]   Medicare Important Message Given:  Yes    Suzy Kugel, Rory Percy, RN 11/26/2015, 10:41 AM

## 2015-11-26 NOTE — Progress Notes (Signed)
Had 2 episodes of coffee ground emesis in HD. Hb of 8.7 ( drop from 10.5 on admission).  Vitals stable. Appears fatigued. No abdominal distention or increased tenderness Given underlying pancreatic mass with ? Involvement of SMA and portal vein, cannot r/o underlying bleeding gastric ulcer or bleeding varices. Dialysis interrupted and pt sent to the floor. discharge to SNF held. Keep NPO.  [facility] consulted.

## 2015-11-26 NOTE — Progress Notes (Signed)
Paged NP Rogue Bussing, patient's hemoglobin 7.8.  NP Rogue Bussing advised no new orders at this time and to notify MD of next hemoglobin result.

## 2015-11-26 NOTE — Progress Notes (Signed)
Report Given to Nurse on [place]. family notified of patients transfer. Patient has all of his belongings.

## 2015-11-26 NOTE — Clinical Social Work Note (Addendum)
CSW advised that patient ready for discharge back to [facility] today. Facility informed and is ready to accept patient. Patient needs hemoglobin drawn and nurse has been unsuccessful.   6:53 pm - CSW informed by nurse that patient being transferred to [facility].  [place] Peifer Givens, MSW, LCSW Licensed Clinical Social Worker [place] [phone]

## 2015-11-26 NOTE — Progress Notes (Signed)
Lab went to get hemoglobin for patient was unsuccessful first attempt. Patient became agitated and refused another stick. Patient requested lab come back in a couple hours to try again.

## 2015-11-26 NOTE — Progress Notes (Signed)
[facility] Progress Note  Assessment/Plan: 1. C Diff per PCR: On flagyl/vanc per primary. Enteric isolation 3. Gallstone pancreatitis: CT of abd/pelvis 06/12/17Abnormal appearance of pancreas-suspected infiltrating neoplasm suspected. CA [id number] MRI poorly defined mass of pancreatic head suspicious for adenocarcinoma than involves celiac/SMA artieres and occlusion of the portal vein- Refused additional CT today: I explained the MRI findings to him again.  Primary has discussed findings with pt's son./daughter 4. ESRD - MWF at [facility]. HD on schedule yesterday. Tolerated well-no issues. Next tx today 4.0 K bath.  5. Hypertension/volume - BP ok Requires Midodrine on HD days. Has been leaving below EDW in outpt unit- lowered 1.5 last admission - continue to lower  6. Anemia - HGB 9.9 - last Mircera 50 6/2 - due for redose - hold on redosing for now  7. Metabolic bone disease - Last incenter phos 1.8 11/17/15. Stop binders, Cont VDRA- actually hectorol was JUST resumed this month by MD - has previous calciphylaxis but wounds healed and iPTH had been trending up 8. Nutrition - Albumin 3.1.  heart healthy/Carb mod diet.  Nepro/renal vit. Poor appetite-- has been losing weight 9. DM: Per primary   9.  Chronic systolic CHF: Volume stable per primary  10. Disp - for d/c today with f/u in outpt setting  [name], PA-C [place] [phone] 11/26/2015,11:26 AM  LOS: 4 days   Pt seen, examined and agree w A/P as above. [name] MD [place] pager [phone]    cell [phone] 11/26/2015, 3:39 PM    Subjective:   Having a hard time understanding Dr. Clementeen Graham.  Objective Filed Vitals:   11/25/15 1726 11/25/15 1957 11/26/15 0514 11/26/15 0914  BP: 133/57 112/54 139/59 134/69  Pulse: 102 95 100 101  Temp: 99.1 F (37.3 C) 99.1 F (37.3 C) 98.4 F (36.9 C) 97.9 F (36.6 C)  TempSrc: Oral Oral Oral Oral  Resp: 18 18 20 18    Height:      Weight:   68.6 kg (151 lb 3.8 oz)   SpO2: 100% 97% 99% 100%   Physical Exam General:NAD Heart:tachyreg Lungs: no rales Abdomen: soft NT Extremities: left BKA tr RLE edema Dialysis Access: left lower AVGG + bruit  Dialysis Orders: 4 hrs 400/500 EDW 71 kg 2.0K/2.25 Ca UF profile 4 Linear NA L AVG Heparin 5000 units IV q treatment Mircera 75 mcg IV q 4 weeks (last dose 11/12/15 HGB 10.3 11/17/15) Hectorol 3 mcg IV q tx (last PTH 1794 10/07/15) Additional Objective Labs: Basic Metabolic Panel:  Recent Labs Lab 11/22/15 1955 11/23/15 0220 11/24/15 1838  NA 136 135 133*  K 3.5 3.4* 3.3*  CL 99* 102 101  CO2 23 20* 20*  GLUCOSE 107* 104* 135*  BUN 35* 37* 52*  CREATININE 8.69* 8.52* 10.41*  CALCIUM 8.0* 7.4* 7.9*   Liver Function Tests:  Recent Labs Lab 11/22/15 1955  AST 19  ALT 10*  ALKPHOS 144*  BILITOT 1.7*  PROT 7.2  ALBUMIN 3.1*    Recent Labs Lab 11/22/15 1955  LIPASE 34   CBC:  Recent Labs Lab 11/22/15 1955 11/23/15 0220 11/24/15 1838  WBC 9.5 8.2 12.0*  NEUTROABS 7.5  --   --   HGB 10.9* 9.2* 9.9*  HCT 34.9* 29.3* 31.5*  MCV 83.5 82.5 80.6  PLT 119* 108* 192   Blood Culture    Component Value Date/Time   SDES BLOOD RIGHT HAND 11/22/2015 1955   SPECREQUEST BOTTLES DRAWN AEROBIC AND  ANAEROBIC 5CC 11/22/2015 1955   CULT NO GROWTH 3 DAYS 11/22/2015 1955   REPTSTATUS PENDING 11/22/2015 1955   CBG:  Recent Labs Lab 11/25/15 0841 11/25/15 1152 11/25/15 1724 11/25/15 2040 11/26/15 0908  GLUCAP 116* 107* 142* 124* 171*    Studies/Results: Mr Abdomen Wo Contrast  11/25/2015  CLINICAL DATA:  Evaluate pancreas mass EXAM: MRI ABDOMEN WITHOUT CONTRAST TECHNIQUE: Multiplanar multisequence MR imaging was performed without the administration of intravenous contrast. COMPARISON:  11/22/2015 FINDINGS: Exam detail diminished due to motion artifact as well as lack of IV contrast material. Lower chest:  No pleural fluid identified.  Hepatobiliary: Abnormal signal of the liver in spleen are noted suggestive of hemochromatosis. No mass identified. Normal appearance of the gallbladder. No biliary dilatation identified. Pancreas: There is abnormal dilatation of the pancreatic duct from the body through the tail. Within the head and neck of pancreas there is a and ill defined area of soft tissue infiltration which is difficult to assess due to lack of IV contrast material. This measures 5.6 cm transverse 2.9 cm AP and 5.9 cm in length. Spleen: The spleen appears enlarged measuring 16 cm in length. No focal splenic abnormality identified. Adrenals/Urinary Tract: The adrenal glands are normal. Changes of bilateral polycystic kidneys identified. Stomach/Bowel: The stomach appears normal. No pathologic dilatation of the upper abdominal bowel loops. Vascular/Lymphatic: Aortic atherosclerosis noted. No aneurysm. The portal vein is not visualized and is presumably occluded by mass. They SMA and celiac arteries have a common origin and both appear partially encased by this mass.No retroperitoneal adenopathy identified peripancreatic lymph node is enlarged measuring 1.8 cm, image 47 of series 11. Other: No significant free fluid or abnormal fluid collections identified. Musculoskeletal: There is diffuse abnormal signal throughout the bone marrow suggestive of iron overload. IMPRESSION: 1. Poorly defined mass involving the head of pancreas and obstructing the pancreatic duct is again noted. Suspicious for pancreatic adenocarcinoma. This is difficult to further characterize above what was seen on CT from 11/22/2015. This is largely a reflection of lack of IV contrast material (due to chronic kidney disease) and motion artifact. There appears to be involvement of the celiac and SMA arteries and occlusion of the portal vein. Assuming no no allergy to IV contrast material then further assessment of this lesion with pancreas protocol CT of the abdomen would be  advised followed by dialysis. 2. Abnormal signal within the bone marrow, liver and spleen is suggestive of hemochromatosis. 3. Aortic atherosclerosis 4. Bilateral end-stage polycystic kidneys. Electronically Signed   By: Kerby Moors M.D.   On: 11/25/2015 11:29   Medications:   . aspirin  81 mg Oral Daily  . atorvastatin  20 mg Oral QHS  . B-complex with vitamin C  1 tablet Oral QHS  . feeding supplement (PRO-STAT SUGAR FREE 64)  30 mL Oral Daily  . gabapentin  100 mg Oral QHS  . heparin  5,000 Units Subcutaneous Q8H  . heparin  5,000 Units Dialysis Once in dialysis  . insulin aspart  0-5 Units Subcutaneous QHS  . insulin aspart  0-9 Units Subcutaneous TID WC  . metronidazole  500 mg Intravenous Q8H  . midodrine  5 mg Oral 3 times per day on Sun Tue Thu Sat   And  . midodrine  5 mg Oral 2 times per day on Mon Wed Fri  . sodium chloride flush  3 mL Intravenous Q12H  . vancomycin  500 mg Oral Q6H

## 2015-11-26 NOTE — Progress Notes (Signed)
Patient went to HD and soon after arriving vomited up dark brown, black emesis with no odor.  MD paged. At around 1345 HD nurse called to notify me that patient had vomited again, dark brown, black emesis. The nephrologist at that point decided to stop his treatment short and return him to his room. One patient arrived in his room the HD nurse and I cleaned him up. VVS. MD paged again. Will continue to monitor.

## 2015-11-26 NOTE — Consult Note (Signed)
Consultation  Referring Provider: Dr. Clementeen Graham  Primary Care Physician:  [name], MD Primary Gastroenterologist:   Dr. Hilarie Fredrickson      Reason for Consultation: Coffee ground emesis             HPI:   Sean [name] is a 80 y.o. male with a hx significant for ischemic colitis s/p partial colectomy, cholelithiasis, DM2, ESRD on HD, S/p left AKA, hypotension, anemia and depression, who was admitted to the hospital on 11/22/15 complaining of fever, chills, abdominal pain and diarrhea. The patient was found to be C.Diff positive and was started on vanc and flagyl. Ct abdomen shows abnormal pancrease with infiltrating pancreatic neoplasm suspected as well as distended gallbladder with dependent gallstones. Gen surgery has been consulted. We were consulted today due to pt acute onset of coffee-ground emesis.  At time of my interview the patient is very agitated at his primary care team as well as me. He explains that no one has "treated him well". Apparently no one is "listening to him". He tells me he had 2 episodes of vomiting this afternoon at 12:30 and it was "brown stuff", that they "tell me might be blood". At the time of my interview, the patient does proceed to have a very large melenic stool. He then makes me leave the room until this is cleaned up before he can "further decide whether or not he wants an EGD". When I return to the room the patient is further agitated and declines the procedure until being able to speak with his son around 5:30 or 6 tonight. We did discuss the risks of delaying this procedure, but he continues to decline.  Patient was tachycardic at time of my exam.  Past GI Eval: 07/14/14-Colonoscopy-Dr. Pyrtle: Evidence of prior surgical anastamosis in the rt colon with inflammation/ulceration, exam otherwise normal; Path:COLONIC MUCOSA WITH REACTIVE CHANGES AND GRANULATION TISSUE. - NO ISCHEMIA, DYSPLASIA, OR MALIGNANCY 09/10/02-Colonoscopy-Dr. Scarlette Shorts: 6 polyps  throughout colon, internal hemorrhoids; Path: hyperplastic polyps and one tubulovillous adenoma in the sigmoid colon  Past Medical History  Diagnosis Date  . Hyperparathyroidism   . Arthritis   . Hyperlipidemia   . Hypertension   . Stroke ([place])   . GERD (gastroesophageal reflux disease)   . Migraine   . Heart attack ([place]) 2011; 1990's  . Peripheral neuropathy ([place])   . Depression with anxiety   . Dialysis patient [facility])     M, W, F; [place] CAD (coronary artery disease)     a. s/p NSTEMI and failed RCA PCI in 2011. b. Nuc 12/2013: nonischemic, prior inferior MI.  Marland Kitchen LBBB (left bundle branch block)   . Calciphylaxis     a. Multiple admissions for nonhealing L ulcer/sepsis, s/p L AKA 02/02/2014.  Marland Kitchen Chronic anemia   . Dementia   . ESRD (end stage renal disease) on dialysis ([place])   . Renal insufficiency   . Hypotension     renovascular  . History of left bundle branch block   . Type II diabetes mellitus ([place])     not taking any medications.  . Cholelithiasis 01/2014    acute cholecystitis managed with perc drain due to high surgical risk.     Past Surgical History  Procedure Laterality Date  . Parathyroidectomy  2011  . Capd insertion  09/12/2011    Procedure: LAPAROSCOPIC INSERTION CONTINUOUS AMBULATORY PERITONEAL DIALYSIS  (CAPD) CATHETER;  Surgeon: Edward Jolly, MD;  Location: [place];  Service:  General;  Laterality: N/A;  . Sp av dialysis shunt access existing *l*  2003  . Dg av dialysis shunt intro ndl*r* or      "not working" (09/19/11)  . Capd removal  03/12/2012    Procedure: CONTINUOUS AMBULATORY PERITONEAL DIALYSIS  (CAPD) CATHETER REMOVAL;  Surgeon: Edward Jolly, MD;  Location: [facility];  Service: General;  Laterality: N/A;  . Amputation Left 02/02/2014    Procedure: AMPUTATION ABOVE KNEE-LEFT;  Surgeon: Elam Dutch, MD;  Location: [place];  Service: Vascular;  Laterality: Left;  . I&d extremity Left 03/31/2014    Procedure: IRRIGATION AND  DEBRIDEMENT EXTREMITY - LEFT ABOVE KNEE AMPUTATION;  Surgeon: Elam Dutch, MD;  Location: [place];  Service: Vascular;  Laterality: Left;  . Application of wound vac Left 03/31/2014    Procedure: APPLICATION OF WOUND VAC - LEFT AKA;  Surgeon: Elam Dutch, MD;  Location: [facility];  Service: Vascular;  Laterality: Left;  . Colon surgery  2011    colon resection   . Colonoscopy with propofol N/A 07/14/2014    Procedure: COLONOSCOPY WITH PROPOFOL;  Surgeon: Jerene Bears, MD;  Location: [facility];  Service: Gastroenterology;  Laterality: N/A;    Family History  Problem Relation Age of Onset  . Cancer Sister     breast  . Cancer Brother     lung  . Anesthesia problems Neg Hx   . Hypotension Neg Hx   . Malignant hyperthermia Neg Hx   . Pseudochol deficiency Neg Hx     Social History  Substance Use Topics  . Smoking status: Former Smoker -- 0.25 packs/day for 7 years    Types: Cigarettes    Quit date: 06/12/1976  . Smokeless tobacco: Never Used  . Alcohol Use: No    Prior to Admission medications   Medication Sig Start Date End Date Taking? Authorizing Provider  acetaminophen (TYLENOL) 325 MG tablet Take 650 mg by mouth every 4 (four) hours as needed for mild pain, fever or headache (DO NOT EXCEED 4 GM OF APAP IN 24 HOURS FORM ALL SOURCES).    Yes Historical Provider, MD  Amino Acids-Protein Hydrolys (FEEDING SUPPLEMENT, PRO-STAT SUGAR FREE 64,) LIQD Take 30 mLs by mouth daily.   Yes Historical Provider, MD  aspirin 81 MG chewable tablet Chew 81 mg by mouth daily.    Yes Historical Provider, MD  atorvastatin (LIPITOR) 20 MG tablet Take 20 mg by mouth at bedtime.    Yes Historical Provider, MD  b-complex w/C (TOTAL B/C) TABS tablet Take 1 tablet by mouth at bedtime.   Yes Historical Provider, MD  docusate sodium (COLACE) 100 MG capsule Take 100 mg by mouth daily.   Yes Historical Provider, MD  gabapentin (NEURONTIN) 100 MG capsule Take 100 mg by mouth at bedtime.    Yes Historical  Provider, MD  loperamide (IMODIUM) 2 MG capsule Take 2-4 mg by mouth See admin instructions. 4 mg (as needed) initially followed by 2 mg after each loose stool   Yes Historical Provider, MD  midodrine (PROAMATINE) 5 MG tablet Take 5 mg by mouth 3 (three) times daily. Do not take 6 am dose on M- W- F- d/t  dialysis days. 02/01/15  Yes Historical Provider, MD  ondansetron (ZOFRAN) 4 MG tablet Take 4 mg by mouth every 6 (six) hours as needed for nausea.   Yes Historical Provider, MD  sevelamer carbonate (RENVELA) 800 MG tablet Take 800 mg by mouth daily with lunch. 12 pm and  5 pm.   Yes Historical Provider, MD  sorbitol 70 % solution Take 30 mLs by mouth daily as needed (severe constipation).   Yes Historical Provider, MD  HYDROcodone-acetaminophen (NORCO) 10-325 MG tablet Take one tablet by mouth every 8 hours as needed for moderate pain. Do not exceed 4gm of Tylenol in 24 hours 11/26/15   Nishant Dhungel, MD  metroNIDAZOLE (FLAGYL) 500 MG tablet Take 1 tablet (500 mg total) by mouth 3 (three) times daily. 11/26/15 12/06/15  Nishant Dhungel, MD  vancomycin (VANCOCIN) 50 mg/mL oral solution Take 10 mLs (500 mg total) by mouth every 6 (six) hours. 11/26/15 12/06/15  Flonnie Overman Dhungel, MD    Current Facility-Administered Medications  Medication Dose Route Frequency Provider Last Rate Last Dose  . acetaminophen (TYLENOL) tablet 650 mg  650 mg Oral Q4H PRN Ivor Costa, MD   650 mg at 11/23/15 2114  . alum & mag hydroxide-simeth (MAALOX/MYLANTA) 200-200-20 MG/5ML suspension 30 mL  30 mL Oral Q4H PRN Nishant Dhungel, MD   30 mL at 11/24/15 0902  . aspirin chewable tablet 81 mg  81 mg Oral Daily Ivor Costa, MD   81 mg at 11/26/15 0936  . atorvastatin (LIPITOR) tablet 20 mg  20 mg Oral QHS Ivor Costa, MD   20 mg at 11/25/15 2148  . B-complex with vitamin C tablet 1 tablet  1 tablet Oral QHS Ivor Costa, MD   1 tablet at 11/25/15 2147  . feeding supplement (PRO-STAT SUGAR FREE 64) liquid 30 mL  30 mL Oral Daily Ivor Costa,  MD   30 mL at 11/26/15 0936  . gabapentin (NEURONTIN) capsule 100 mg  100 mg Oral QHS Ivor Costa, MD   100 mg at 11/25/15 2150  . heparin injection 5,000 Units  5,000 Units Subcutaneous Q8H Ivor Costa, MD   5,000 Units at 11/26/15 0803  . HYDROcodone-acetaminophen (NORCO) 10-325 MG per tablet 1 tablet  1 tablet Oral Q4H PRN Ivor Costa, MD   1 tablet at 11/26/15 1300  . hydrOXYzine (VISTARIL) injection 25 mg  25 mg Intramuscular Q6H PRN Ivor Costa, MD   25 mg at 11/26/15 1300  . insulin aspart (novoLOG) injection 0-5 Units  0-5 Units Subcutaneous QHS Ivor Costa, MD   0 Units at 11/23/15 0023  . insulin aspart (novoLOG) injection 0-9 Units  0-9 Units Subcutaneous TID WC Ivor Costa, MD   2 Units at 11/26/15 0935  . metroNIDAZOLE (FLAGYL) IVPB 500 mg  500 mg Intravenous Q8H Clanford L Johnson, MD   500 mg at 11/26/15 1452  . midodrine (PROAMATINE) tablet 5 mg  5 mg Oral 3 times per day on Sun Tue Thu Sat Ivor Costa, MD   5 mg at 11/25/15 1728   And  . midodrine (PROAMATINE) tablet 5 mg  5 mg Oral 2 times per day on Mon Wed Fri Ivor Costa, MD   5 mg at 11/26/15 1315  . morphine 2 MG/ML injection 2 mg  2 mg Intravenous Q4H PRN Ivor Costa, MD   2 mg at 11/23/15 0423  . sodium chloride flush (NS) 0.9 % injection 3 mL  3 mL Intravenous Q12H Ivor Costa, MD   3 mL at 11/26/15 0941  . vancomycin (VANCOCIN) 50 mg/mL oral solution 500 mg  500 mg Oral Q6H Ivor Costa, MD   500 mg at 11/26/15 0655    Allergies as of 11/22/2015 - Review Complete 11/22/2015  Allergen Reaction Noted  . Benazepril hcl Hives, Itching, and Rash  Review of Systems:    Constitutional: Positive fevers and chills with poor appetite and fatigue at time of admission, denies recent HEENT: Eyes: No change in vision               Ears, Nose, Throat:  No change in hearing Skin: No rash or itching Cardiovascular: No chest pain or pressure    Respiratory: No SOB or cough Gastrointestinal: See HPI and otherwise negative Genitourinary: No change  in urinary frequency Neurological: No headache or dizziness Musculoskeletal: No muscle or back pain Hematologic: See history of present illness and otherwise negative Psychiatric: No history of depression or anxiety    Physical Exam:  Vital signs in last 24 hours: Temp:  [97.9 F (36.6 C)-99.1 F (37.3 C)] 98.5 F (36.9 C) (06/16 1331) Pulse Rate:  [75-125] 125 (06/16 1335) Resp:  [18-22] 22 (06/16 1331) BP: (112-145)/(54-106) 121/73 mmHg (06/16 1335) SpO2:  [97 %-100 %] 100 % (06/16 0914) Weight:  [151 lb 3.8 oz (68.6 kg)-153 lb 14.1 oz (69.8 kg)] 153 lb 14.1 oz (69.8 kg) (06/16 1239) Last BM Date: 11/26/15 General: Agitated African American male appears to be in NAD, Well developed, Well nourished, alert and cooperative Head:  Normocephalic and atraumatic. Eyes:   PEERL, EOMI. No icterus. Conjunctiva pink. Ears:  Normal auditory acuity. Neck:  Supple Throat: Oral cavity and pharynx without inflammation, swelling or lesion.  Lungs: Respirations even and unlabored. Lungs clear to auscultation bilaterally.   No wheezes, crackles, or rhonchi.  Heart: Normal S1, S2. 3/6 systolic murmur. Regular rate and rhythm. No peripheral edema, cyanosis or pallor.  Abdomen:  Soft, Mild ttp in b/l lower quadrants, slightly distended. No rebound or guarding. Normal bowel sounds. No appreciable masses or hepatomegaly. Rectal:  Passing melena at the time of my interview Msk:  Symmetrical without gross deformities. Peripheral pulses intact. Left AKA Extremities:  Without edema, no deformity or joint abnormality. Normal ROM, normal sensation.Left AKA Neurologic:  Alert and  oriented x4;  grossly normal neurologically. CN II-XII intact.  Skin:   Dry and intact without significant lesions or rashes. Psychiatric: Oriented to person, place and time. Agitated   LAB RESULTS:  Recent Labs  11/24/15 1838 11/26/15 1236  WBC 12.0* 9.4  HGB 9.9* 8.7*  HCT 31.5* 27.5*  PLT 192 205   BMET  Recent  Labs  11/24/15 1838 11/26/15 1236  NA 133* 135  K 3.3* 3.1*  CL 101 99*  CO2 20* 22  GLUCOSE 135* 197*  BUN 52* 47*  CREATININE 10.41* 7.09*  CALCIUM 7.9* 7.9*   LFT  Recent Labs  11/26/15 1236  ALBUMIN 2.5*   PT/INR No results for input(s): LABPROT, INR in the last 72 hours.  STUDIES: Mr Abdomen Wo Contrast  November 27, 2015  CLINICAL DATA:  Evaluate pancreas mass EXAM: MRI ABDOMEN WITHOUT CONTRAST TECHNIQUE: Multiplanar multisequence MR imaging was performed without the administration of intravenous contrast. COMPARISON:  11/22/2015 FINDINGS: Exam detail diminished due to motion artifact as well as lack of IV contrast material. Lower chest:  No pleural fluid identified. Hepatobiliary: Abnormal signal of the liver in spleen are noted suggestive of hemochromatosis. No mass identified. Normal appearance of the gallbladder. No biliary dilatation identified. Pancreas: There is abnormal dilatation of the pancreatic duct from the body through the tail. Within the head and neck of pancreas there is a and ill defined area of soft tissue infiltration which is difficult to assess due to lack of IV contrast material. This measures 5.6 cm transverse 2.9  cm AP and 5.9 cm in length. Spleen: The spleen appears enlarged measuring 16 cm in length. No focal splenic abnormality identified. Adrenals/Urinary Tract: The adrenal glands are normal. Changes of bilateral polycystic kidneys identified. Stomach/Bowel: The stomach appears normal. No pathologic dilatation of the upper abdominal bowel loops. Vascular/Lymphatic: Aortic atherosclerosis noted. No aneurysm. The portal vein is not visualized and is presumably occluded by mass. They SMA and celiac arteries have a common origin and both appear partially encased by this mass.No retroperitoneal adenopathy identified peripancreatic lymph node is enlarged measuring 1.8 cm, image 47 of series 11. Other: No significant free fluid or abnormal fluid collections identified.  Musculoskeletal: There is diffuse abnormal signal throughout the bone marrow suggestive of iron overload. IMPRESSION: 1. Poorly defined mass involving the head of pancreas and obstructing the pancreatic duct is again noted. Suspicious for pancreatic adenocarcinoma. This is difficult to further characterize above what was seen on CT from 11/22/2015. This is largely a reflection of lack of IV contrast material (due to chronic kidney disease) and motion artifact. There appears to be involvement of the celiac and SMA arteries and occlusion of the portal vein. Assuming no no allergy to IV contrast material then further assessment of this lesion with pancreas protocol CT of the abdomen would be advised followed by dialysis. 2. Abnormal signal within the bone marrow, liver and spleen is suggestive of hemochromatosis. 3. Aortic atherosclerosis 4. Bilateral end-stage polycystic kidneys. Electronically Signed   By: Kerby Moors M.D.   On: 11/25/2015 11:29     PREVIOUS ENDOSCOPIES:            See HPI   Impression / Plan:  Impression: 1. Coffee ground emesis: 2 episodes this afternoon around 12:30 while in hemodialysis, at time of my interview, large quantity of melenic stool passed-hgb has dropped from 10.9 to 8.7 over time of this admission-no recent hgb since time of emesis and melena- consider gastritis vs Gastric varices versus esophageal varices versus pud vs other 2. C-Diff: Positive during hospital stay, on vanc and flagyl per primary care team 3.  Abnormal Ct Pancreas: showing mass in pancreas-confirmed via MRI w/o contrast today as above-concern that there may be splenic vein thrombosis causing varices 4. ESRD  Plan: 1. Ordered repeat hgb stat and then q6h, notify MD on call for hgb <8, Transfusion as necessary 2. Continue supportive measures 3. Agree with abx for C.Diff 4. Consider further workup of pancreatic head mass with EUS/FNA in the future 5. Patient will need EGD for further eval of acute  upper GI bleed, concern for esophageal varices due to possible splenic vein thrombosis caused by pancreatic mass, at this time he declines this procedure and would like to "speak with his son first.  I did ask him if he would like me to call his son in regards to having this procedure done as it appears as though he is acutely bleeding. He declined. He would like to see his son "eye to eye" before making further decisions. His son is not scheduled to arrive until 5:30 or 6 PM tonight. The patient is aware of risks of delayed procedure such as necessity for blood transfusion and further complications. 6. Will make diet nothing by mouth. 7. Started Protonix with 80 mg IV bolus followed by a drip. 8. Please notify GI doctor on call if family decides to proceed with EGD after hours or this weekend. 9. Recommend transferring to step-down unit due to acute bleeding-discussed with Dr. Clementeen Graham 10. Will discuss  above with Dr. Havery Moros, please await any further recs  Thank you for your kind consultation, we will continue to follow.  Sean [name]  11/26/2015, 3:09 PM Pager #: [phone]

## 2015-11-26 NOTE — Progress Notes (Signed)
Paged [facility] NP Rogue Bussing, advised patient has agreed to proceed with endoscopy.  NP Rogue Bussing advised GI needs to be informed.  Paged MD Collene Mares with gastroenterology.  No return call, will inform GI in the morning of patients agreement to endoscopy as this is not an emergent situation.

## 2015-11-26 NOTE — Progress Notes (Signed)
   11/26/15 1300  PT Visit Information  Last PT Received On 11/26/15  Reason Eval/Treat Not Completed Patient at procedure or test/unavailable  Pt in HD and will try later as time allows.   Mee Hives, PT MS Acute Rehab Dept. Number: [place] [facility] [place]

## 2015-11-26 NOTE — Progress Notes (Signed)
Patient arrived to unit per bed.  Reviewed treatment plan and this RN agrees.  Report received from bedside RN, Lurena Joiner.  Consent verified.  Patient A & O X 4, nauseated. Lung sounds clear to ausculation in all fields. generalized edema. Cardiac: NSR.  Prepped LLAVF with alcohol and cannulated with two 15 gauge needles.  Pulsation of blood noted.  Flushed access well with saline per protocol.  Connected and secured lines and initiated tx at 1242.  UF goal of 1000 mL and net fluid removal of 500 mL.  Will continue to monitor.

## 2015-11-27 ENCOUNTER — Inpatient Hospital Stay (HOSPITAL_COMMUNITY): Payer: Medicare Other

## 2015-11-27 DIAGNOSIS — K922 Gastrointestinal hemorrhage, unspecified: Secondary | ICD-10-CM | POA: Diagnosis not present

## 2015-11-27 LAB — GLUCOSE, CAPILLARY
GLUCOSE-CAPILLARY: 99 mg/dL (ref 65–99)
GLUCOSE-CAPILLARY: 101 mg/dL — AB (ref 65–99)
Glucose-Capillary: 118 mg/dL — ABNORMAL HIGH (ref 65–99)

## 2015-11-27 LAB — CULTURE, BLOOD (ROUTINE X 2)
CULTURE: NO GROWTH
Culture: NO GROWTH

## 2015-11-27 LAB — HEMOGLOBIN
HEMOGLOBIN: 8.2 g/dL — AB (ref 13.0–17.0)
Hemoglobin: 7.5 g/dL — ABNORMAL LOW (ref 13.0–17.0)
Hemoglobin: 7.6 g/dL — ABNORMAL LOW (ref 13.0–17.0)
Hemoglobin: 9.7 g/dL — ABNORMAL LOW (ref 13.0–17.0)

## 2015-11-27 NOTE — Progress Notes (Addendum)
PROGRESS NOTE                                                                                                                                                                                                             Patient Demographics:    Sean [name], is a 80 y.o. male, DOB - January 12, 1936, [id number]  Admit date - 11/22/2015   Admitting Physician Ivor Costa, MD  Outpatient Primary MD for the patient is [name], MD  LOS - 5  Outpatient Specialists: Nephrology  Chief Complaint  Patient presents with  . Diarrhea       Brief Narrative   80 year old male with history of ischemic colitis status post bowel resection, cholelithiasis, recent hospitalization for gallstone pancreatitis, ESRD on dialysis (M, W, F), status post left AKA, hypotension, anemia, anxiety and depression, diet-controlled diabetes mellitus presented with abdominal pain as stated with fever and diarrhea for the past 3 days. Reported 6-7 bowel movements with loose stools with periumbilical pain, [id number] in severe at the nonradiating. Reported nausea but no vomiting. He reported having fever of 103F at home.  In the ED stool first C. difficile was positive, had mildly elevated lactic acid and had low-grade temperature. Creatinine was 8.69. CT abdomen pelvis done in the ED showed mild infectious versus inflammatory colitis of the descending, sigmoid colon and rectum. Also showed suspicious infiltrating pancreatic neoplasm.  Patient refused further CT imaging with pancreatic protocol and plan for biopsy as outpatient. He was planned to be discharged back to skilled nursing facility on 6/16 after dialysis. However during dialysis he had 2 episodes of coffee-ground emesis and further dialysis was aborted. Repeat CBC showed some drop in H&H. GI consulted who evaluated the patient and offered EGD but he refused. Patient was placed on Protonix drip , Made  nothing by mouth and transfer to stepdown unit.   Subjective:   Reports feeling tired. Informs me that he does not want to see me from tomorrow as he does not get a "good vibe". He did not let me examine him as well.   Assessment  & Plan :   Principal Problem:  Sepsis with C. difficile colitis Sepsis resolved. On oral vancomycin and Flagyl. Diarrhea resolved... Plan on 2 weeks of antibiotics total (stop date 12/06/2015)  Active Problems: Acute upper GI bleed Suspect this could be variceal  bleed based on occlusion of portal vein seen on MRI. Seen by [facility] and offered EGD which patient declined. GI will follow again today. Remains nothing by mouth. Some drop in H&H noted today. Continue PPI drip. Transfusion not needed at present.  Recent history of gallstone pancreatitis. Was scheduled for cholecystectomy on 6/14. Surgery was consulted who recommended holding off until acute illness had resolved. No clinical signs or symptoms of acute pancreatitis. Should follow-up with surgery in 2 weeks.  ? Pancreatic adenocarcinoma. Seen on admission CT (was seen on CT of the abdomen few weeks back when he was admitted but without clear view due to underlying pancreatitis). MRI without contrast done shows poorly defined mass over head of pancreas and obstructive pancreatic duct suspicious for adenocarcinoma. Possible involvement of SMA , celiac arteries and occlusion of portal vein. Recommend CT abd with contrast with pancreatic protocol.  CA 19-9 markedly elevated to 2400. Patient refused CT with pancreatic protocol. If agrees in future, he will need CT followed by pancreatic biopsy.       End stage renal disease on dialysis [facility]) Received scheduled dialysis. No issues.  CAD Hold aspirin. Continue Lipitor   Diet-controlled diabetes mellitus Monitor on SSI.  Chronic systolic CHF (congestive heart failure) ([place]): Euvolemic. Volume management with dialysis.    Patient refused to let  me examine him and discuss plan with him further. He says that he doesn't want to see me again and that I do not give him a "good vibe" and am giving him "bad news" constantly.  I have told him that I will assign him to one of my partners from tomorrow and he agrees to it.           Code Status : Full code  Family Communication  : Son and daughter on the phone on 6/16  Disposition Plan  : To skilled nursing facility once acute issues resolve  Barriers For Discharge : GI bleed  Consults  :   Renal GI  Procedures  :  CT abdomen without contrast MRI abdomen without contrast  DVT Prophylaxis  :  SCDs  Lab Results  Component Value Date   PLT 205 11/26/2015    Antibiotics  :    Anti-infectives    Start     Dose/Rate Route Frequency Ordered Stop   11/26/15 0000  vancomycin (VANCOCIN) 50 mg/mL oral solution     500 mg Oral Every 6 hours 11/26/15 1159 12/06/15 2359   11/26/15 0000  metroNIDAZOLE (FLAGYL) 500 MG tablet     500 mg Oral 3 times daily 11/26/15 1159 12/06/15 2359   11/23/15 0830  metroNIDAZOLE (FLAGYL) IVPB 500 mg     500 mg 100 mL/hr over 60 Minutes Intravenous Every 8 hours 11/23/15 0814     11/23/15 0115  vancomycin (VANCOCIN) 50 mg/mL oral solution 500 mg     500 mg Oral Every 6 hours 11/23/15 0113 12/06/15 2359   11/22/15 2359  metroNIDAZOLE (FLAGYL) IVPB 500 mg  Status:  Discontinued     500 mg 100 mL/hr over 60 Minutes Intravenous Every 8 hours 11/22/15 2311 11/23/15 0113   11/22/15 2359  ciprofloxacin (CIPRO) IVPB 400 mg  Status:  Discontinued    Comments:  Cipro 400 mg IV q24h for CrCl < 30 mL/min   400 mg 200 mL/hr over 60 Minutes Intravenous Daily at bedtime 11/22/15 2332 11/23/15 0113   11/22/15 2200  piperacillin-tazobactam (ZOSYN) IVPB 2.25 g  Status:  Discontinued  2.25 g 100 mL/hr over 30 Minutes Intravenous Every 8 hours 11/22/15 1631 11/22/15 2310   11/22/15 1630  piperacillin-tazobactam (ZOSYN) IVPB 3.375 g  Status:  Discontinued       3.375 g 100 mL/hr over 30 Minutes Intravenous  Once 11/22/15 1616 11/22/15 2200        Objective:   Filed Vitals:   11/27/15 0100 11/27/15 0411 11/27/15 0700 11/27/15 1212  BP: 107/29 112/57 105/42 115/46  Pulse: 91 90 91 97  Temp:  98.5 F (36.9 C) 98.3 F (36.8 C) 98.2 F (36.8 C)  TempSrc:  Oral Oral Oral  Resp: 10 13 11 24   Height:      Weight:      SpO2: 97% 100% 99% 99%    Wt Readings from Last 3 Encounters:  11/26/15 69.8 kg (153 lb 14.1 oz)  11/12/15 85.73 kg (189 lb)  11/11/15 85.73 kg (189 lb)     Intake/Output Summary (Last 24 hours) at 11/27/15 1222 Last data filed at 11/27/15 0300  Gross per 24 hour  Intake 200.42 ml  Output    -98 ml  Net 298.42 ml     Physical Exam  Gen: not in distress Refused to let me examine him further.   Data Review:    CBC  Recent Labs Lab 11/22/15 1955 11/23/15 0220 11/24/15 1838 11/26/15 1236 11/26/15 2020 11/27/15 0726  WBC 9.5 8.2 12.0* 9.4  --   --   HGB 10.9* 9.2* 9.9* 8.7* 7.8* 7.6*  7.5*  HCT 34.9* 29.3* 31.5* 27.5*  --   --   PLT 119* 108* 192 205  --   --   MCV 83.5 82.5 80.6 82.1  --   --   MCH 26.1 25.9* 25.3* 26.0  --   --   MCHC 31.2 31.4 31.4 31.6  --   --   RDW 16.5* 16.4* 16.9* 17.5*  --   --   LYMPHSABS 0.6*  --   --   --   --   --   MONOABS 1.4*  --   --   --   --   --   EOSABS 0.0  --   --   --   --   --   BASOSABS 0.0  --   --   --   --   --     Chemistries   Recent Labs Lab 11/22/15 1955 11/23/15 0220 11/24/15 1838 11/26/15 1236  NA 136 135 133* 135  K 3.5 3.4* 3.3* 3.1*  CL 99* 102 101 99*  CO2 23 20* 20* 22  GLUCOSE 107* 104* 135* 197*  BUN 35* 37* 52* 47*  CREATININE 8.69* 8.52* 10.41* 7.09*  CALCIUM 8.0* 7.4* 7.9* 7.9*  AST 19  --   --   --   ALT 10*  --   --   --   ALKPHOS 144*  --   --   --   BILITOT 1.7*  --   --   --    ------------------------------------------------------------------------------------------------------------------ No results for  input(s): CHOL, HDL, LDLCALC, TRIG, CHOLHDL, LDLDIRECT in the last 72 hours.  Lab Results  Component Value Date   HGBA1C 6.6 07/07/2015   ------------------------------------------------------------------------------------------------------------------ No results for input(s): TSH, T4TOTAL, T3FREE, THYROIDAB in the last 72 hours.  Invalid input(s): FREET3 ------------------------------------------------------------------------------------------------------------------ No results for input(s): VITAMINB12, FOLATE, FERRITIN, TIBC, IRON, RETICCTPCT in the last 72 hours.  Coagulation profile  Recent Labs Lab 11/22/15 0010  INR 1.49    No results  for input(s): DDIMER in the last 72 hours.  Cardiac Enzymes No results for input(s): CKMB, TROPONINI, MYOGLOBIN in the last 168 hours.  Invalid input(s): CK ------------------------------------------------------------------------------------------------------------------ No results found for: BNP  Inpatient Medications  Scheduled Meds: . aspirin  81 mg Oral Daily  . atorvastatin  20 mg Oral QHS  . B-complex with vitamin C  1 tablet Oral QHS  . feeding supplement (PRO-STAT SUGAR FREE 64)  30 mL Oral Daily  . gabapentin  100 mg Oral QHS  . insulin aspart  0-5 Units Subcutaneous QHS  . insulin aspart  0-9 Units Subcutaneous TID WC  . metronidazole  500 mg Intravenous Q8H  . midodrine  5 mg Oral 3 times per day on Sun Tue Thu Sat   And  . midodrine  5 mg Oral 2 times per day on Mon Wed Fri  . [START ON 11/30/2015] pantoprazole  40 mg Intravenous Q12H  . sodium chloride flush  3 mL Intravenous Q12H  . vancomycin  500 mg Oral Q6H   Continuous Infusions: . sodium chloride 10 mL/hr at 11/26/15 2235  . pantoprozole (PROTONIX) infusion 8 mg/hr (11/27/15 0226)   PRN Meds:.acetaminophen, alum & mag hydroxide-simeth, HYDROcodone-acetaminophen, hydrOXYzine, morphine injection  Micro Results Recent Results (from the past 240 hour(s))  Blood  Culture (routine x 2)     Status: None (Preliminary result)   Collection Time: 11/22/15  7:50 PM  Result Value Ref Range Status   Specimen Description BLOOD RIGHT ARM  Final   Special Requests BOTTLES DRAWN AEROBIC AND ANAEROBIC 5CC  Final   Culture NO GROWTH 4 DAYS  Final   Report Status PENDING  Incomplete  Blood Culture (routine x 2)     Status: None (Preliminary result)   Collection Time: 11/22/15  7:55 PM  Result Value Ref Range Status   Specimen Description BLOOD RIGHT HAND  Final   Special Requests BOTTLES DRAWN AEROBIC AND ANAEROBIC 5CC  Final   Culture NO GROWTH 4 DAYS  Final   Report Status PENDING  Incomplete  C difficile quick scan w PCR reflex     Status: Abnormal   Collection Time: 11/22/15 11:27 PM  Result Value Ref Range Status   C Diff antigen POSITIVE (A) NEGATIVE Final   C Diff toxin POSITIVE (A) NEGATIVE Final   C Diff interpretation Positive for toxigenic C. difficile  Final    Comment: CRITICAL RESULT CALLED TO, READ BACK BY AND VERIFIED WITH: B OLDAND,RN @0100  11/23/15 MKELLY   Gastrointestinal Panel by PCR , Stool     Status: None   Collection Time: 11/22/15 11:27 PM  Result Value Ref Range Status   Campylobacter species NOT DETECTED NOT DETECTED Final   Plesimonas shigelloides NOT DETECTED NOT DETECTED Final   Salmonella species NOT DETECTED NOT DETECTED Final   Yersinia enterocolitica NOT DETECTED NOT DETECTED Final   Vibrio species NOT DETECTED NOT DETECTED Final   Vibrio cholerae NOT DETECTED NOT DETECTED Final   Enteroaggregative E coli (EAEC) NOT DETECTED NOT DETECTED Final   Enteropathogenic E coli (EPEC) NOT DETECTED NOT DETECTED Final   Enterotoxigenic E coli (ETEC) NOT DETECTED NOT DETECTED Final   Shiga like toxin producing E coli (STEC) NOT DETECTED NOT DETECTED Final   E. coli O157 NOT DETECTED NOT DETECTED Final   Shigella/Enteroinvasive E coli (EIEC) NOT DETECTED NOT DETECTED Final   Cryptosporidium NOT DETECTED NOT DETECTED Final    Cyclospora cayetanensis NOT DETECTED NOT DETECTED Final   Entamoeba histolytica NOT DETECTED NOT  DETECTED Final   Giardia lamblia NOT DETECTED NOT DETECTED Final   Adenovirus F40/41 NOT DETECTED NOT DETECTED Final   Astrovirus NOT DETECTED NOT DETECTED Final   Norovirus GI/GII NOT DETECTED NOT DETECTED Final   Rotavirus A NOT DETECTED NOT DETECTED Final   Sapovirus (I, II, IV, and V) NOT DETECTED NOT DETECTED Final  MRSA PCR Screening     Status: None   Collection Time: 11/23/15  6:22 AM  Result Value Ref Range Status   MRSA by PCR NEGATIVE NEGATIVE Final    Comment:        The GeneXpert MRSA Assay (FDA approved for NASAL specimens only), is one component of a comprehensive MRSA colonization surveillance program. It is not intended to diagnose MRSA infection nor to guide or monitor treatment for MRSA infections.     Radiology Reports Ct Abdomen Pelvis Wo Contrast  11/22/2015  CLINICAL DATA:  Abdominal pain beginning over the last 2 days. No vomiting. Recent diagnosis of abdominal blockage. EXAM: CT ABDOMEN AND PELVIS WITHOUT CONTRAST TECHNIQUE: Multidetector CT imaging of the abdomen and pelvis was performed following the standard protocol without IV contrast. COMPARISON:  CTs, 11/07/2015 and 05/03/2014. FINDINGS: Lung bases: Heart is mildly enlarged. There are dense coronary artery calcifications. There is patchy and linear lung base opacity most evident in the left lower lobe, likely atelectasis. Pneumonia is possible. No pulmonary edema. No pleural effusion. Hepatobiliary: Liver is unremarkable. Gallbladder is distended. There are dependent stones but no convincing wall thickening or pericholecystic fluid. No bile duct dilation. Spleen: Mild enlargement measuring 16.5 x 6.7 x 15.4 cm. No splenic mass or focal lesion. Stable. Pancreas: Pancreas is thickened, particularly the pancreatic head, where it surrounds the celiac axis and superior mesenteric artery vasculature. Lower attenuation  areas in the pancreatic tail suggest poorly defined cysts or an irregular dilated duct. Margins of the pancreas are ill-defined. This appearance is similar to the prior exam. Additional soft tissue attenuation extends along the gastrohepatic ligament consistent with poorly defined adenopathy. Largest presumed node measures 16 mm in short axis. Adrenal glands:  No masses. Kidneys, ureters, bladder: Marked renal parenchymal thinning. Numerous bilateral renal cysts. No hydronephrosis. Ureters normal course and in caliber. Bladder is decompressed. Prostate gland:  Mild to moderately enlarged but stable. Ascites:  None. Gastrointestinal: Mild wall thickening suggested along the descending and sigmoid colon extending to the rectum. There is no bowel dilation to suggest obstruction. There are changes from a previous partial right colectomy, stable. Vascular: There are extensive atherosclerotic calcifications throughout the aorta and branch vessels. Musculoskeletal: Large subchondral cysts are noted along the superior acetabula. There is increased density in the bony structures consistent with renal osteodystrophy. There multiple Schmorl's nodes along the spine. Abdominal wall: Small fat containing left para midline abdominal wall hernia just below the umbilicus. IMPRESSION: 1. Abnormal appearance of the pancreas. An infiltrating pancreatic neoplasm is suspected. This could be better defined with pancreatic MRI with and without contrast if the patient can tolerate that procedure. There is evidence of gastrohepatic ligament chain adenopathy, extending along the porta hepatis. 2. Mild wall thickening of the descending colon, sigmoid colon and rectum consistent with a mild infectious or inflammatory colitis. No evidence of bowel obstruction. 3. Stable splenomegaly. 4. Distended gallbladder with dependent gallstones but no convincing acute cholecystitis. 5. Significant renal parenchymal thinning with multiple cysts with  acquired renal cystic disease. 6. Increased opacity the base of the left lower lobe, most likely atelectasis. Consider pneumonia in the proper clinical setting.  Electronically Signed   By: Lajean Manes M.D.   On: 11/22/2015 19:46   Ct Abdomen Pelvis Wo Contrast  11/07/2015  CLINICAL DATA:  Abdominal pain, elevated lipase. Right upper quadrant pain. EXAM: CT ABDOMEN AND PELVIS WITHOUT CONTRAST TECHNIQUE: Multidetector CT imaging of the abdomen and pelvis was performed following the standard protocol without IV contrast. COMPARISON:  Ultrasound earlier this day.  CT 05/06/2014 FINDINGS: Lower chest: Extensive coronary artery calcifications. Calcifications at the lung hila, partially included. Probable bilateral calcified hilar lymph nodes, suboptimally assessed. Atelectasis at the lung bases. Liver: No evidence of focal lesion allowing for lack contrast. Hepatobiliary: Distended gallbladder containing dependent gallstones. Previously assessed by ultrasound. Pancreas: Suboptimally assessed without contrast, however there is diffuse soft tissue fullness. Mild peripancreatic edema. Spleen: Enlarged measuring 15.4 cm craniocaudal. Adrenal glands: Fullness bilaterally.  No discrete nodule. Kidneys: Atrophic parenchyma. Multiple bilateral renal cysts, with a large parapelvic cyst on the left. Some of the cysts appear complicated. Extensive renovascular calcifications. Stomach/Bowel: Stomach distended with ingested contents. Dilated fluid-filled proximal small bowel with transition in the mid right abdomen image 55 series 2. There is mesenteric edema and free fluid. No pneumatosis. More distal small bowel loops are decompressed. Enteric sutures noted at the base of the cecum. There is cecal and ascending colonic wall thickening. Small volume of stool throughout the remainder the colon. Vascular/Lymphatic: Dense atherosclerosis of the abdominal aorta. No aneurysm. No definite retroperitoneal adenopathy. Reproductive:  Prominent sized prostate gland causing mass effect on the bladder base. Bladder: Decompressed. Other: Small volume of free fluid in the abdomen and pelvis. No evidence of free air. Postsurgical change in the anterior abdominal wall with small fat containing periumbilical ventral abdominal wall hernia. Musculoskeletal: There are no acute or suspicious osseous abnormalities. 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 Bony fusion of the sacroiliac joints. Degenerative change throughout spine. IMPRESSION: 1. Small-bowel obstruction with transition point in the right mid abdomen. 2. Soft tissue fullness about the entire pancreas. There is mild peripancreatic edema, however diffuse mesenteric edema is also seen. Underlying pancreatic mass cannot be excluded. If patient is able tolerate breath hold technique, MRI may be of value. 3. Splenomegaly. 4. Cholelithiasis, please reference same-day right upper quadrant ultrasound. 5. Advanced atherosclerosis. 6. Additional chronic findings as described. Electronically Signed   By: Jeb Levering M.D.   On: 11/07/2015 05:55   Mr Abdomen Wo Contrast  11/25/2015  CLINICAL DATA:  Evaluate pancreas mass EXAM: MRI ABDOMEN WITHOUT CONTRAST TECHNIQUE: Multiplanar multisequence MR imaging was performed without the administration of intravenous contrast. COMPARISON:  11/22/2015 FINDINGS: Exam detail diminished due to motion artifact as well as lack of IV contrast material. Lower chest:  No pleural fluid identified. Hepatobiliary: Abnormal signal of the liver in spleen are noted suggestive of hemochromatosis. No mass identified. Normal appearance of the gallbladder. No biliary dilatation identified. Pancreas: There is abnormal dilatation of the pancreatic duct from the body through the tail. Within the head and neck of pancreas there is a and ill defined area of soft tissue infiltration which is difficult to assess due to lack of IV contrast material. This measures 5.6  cm transverse 2.9 cm AP and 5.9 cm in length. Spleen: The spleen appears enlarged measuring 16 cm in length. No focal splenic abnormality identified. Adrenals/Urinary Tract: The adrenal glands are normal. Changes of bilateral polycystic kidneys identified. Stomach/Bowel: The stomach appears normal. No pathologic dilatation of the upper abdominal bowel loops. Vascular/Lymphatic: Aortic atherosclerosis noted. No aneurysm. The portal vein is not  visualized and is presumably occluded by mass. They SMA and celiac arteries have a common origin and both appear partially encased by this mass.No retroperitoneal adenopathy identified peripancreatic lymph node is enlarged measuring 1.8 cm, image 47 of series 11. Other: No significant free fluid or abnormal fluid collections identified. Musculoskeletal: There is diffuse abnormal signal throughout the bone marrow suggestive of iron overload. IMPRESSION: 1. Poorly defined mass involving the head of pancreas and obstructing the pancreatic duct is again noted. Suspicious for pancreatic adenocarcinoma. This is difficult to further characterize above what was seen on CT from 11/22/2015. This is largely a reflection of lack of IV contrast material (due to chronic kidney disease) and motion artifact. There appears to be involvement of the celiac and SMA arteries and occlusion of the portal vein. Assuming no no allergy to IV contrast material then further assessment of this lesion with pancreas protocol CT of the abdomen would be advised followed by dialysis. 2. Abnormal signal within the bone marrow, liver and spleen is suggestive of hemochromatosis. 3. Aortic atherosclerosis 4. Bilateral end-stage polycystic kidneys. Electronically Signed   By: Kerby Moors M.D.   On: 11/25/2015 11:29   Dg Chest Port 1 View  11/22/2015  CLINICAL DATA:  Patient comes from [facility] for post CVA post MI. Patient started having Diarrhea since Friday. Patient started having Abd pain and fever  103.1 . Hx of diabetes, stroke, HTN. EXAM: PORTABLE CHEST 1 VIEW COMPARISON:  05/03/2014 FINDINGS: Nasogastric tube has been removed. Heart is mildly enlarged. Film is made with shallow lung inflation. There is linear density and adjacent lucency at the right lung base, likely related to linear atelectasis. However it is difficult to exclude free intraperitoneal air. There is focal atelectasis or early infiltrate at the left lung base. Mild pulmonary edema is noted. IMPRESSION: 1. Question of free intraperitoneal air beneath the right hemidiaphragm. Further evaluation with left lateral decubitus view of the abdomen or CT of the abdomen and pelvis is recommended. 2. Bibasilar atelectasis or infiltrates, left greater than right. 3. Mild interstitial pulmonary edema. Electronically Signed   By: Nolon Nations M.D.   On: 11/22/2015 17:40   Dg Abd 2 Views  11/08/2015  CLINICAL DATA:  Small-bowel obstruction EXAM: ABDOMEN - 2 VIEW COMPARISON:  CT abdomen 11/07/2015 FINDINGS: Mildly dilated small bowel loops with air-fluid levels. Gas in the transverse colon with air-fluid levels. Upright view does not include the right hemidiaphragm. This would need to be repeated if there is concern of free air. IMPRESSION: Air-fluid levels in large and small bowel may be related to ileus or resolving small bowel obstruction Right hemidiaphragm not included on the upright view. If there is concern of free air, repeat upright view could be performed. Electronically Signed   By: Franchot Gallo M.D.   On: 11/08/2015 15:01   Dg Abd Portable 2v  11/22/2015  CLINICAL DATA:  Diarrhea since Friday with history previous small-bowel obstruction. EXAM: PORTABLE ABDOMEN - 2 VIEW COMPARISON:  X-ray 11/08/2015.  CT scan 10/30/2015. FINDINGS: Right-side-up decubitus film shows no evidence for intraperitoneal free air. Diffuse gaseous small bowel distention is noted. There is some high attenuation which appears to be in small bowel loops over  the central abdomen, nonspecific. Given the central clustering of small bowel , ventral hernia would be a consideration. Bones are diffusely demineralized. , IMPRESSION: Diffuse gaseous bowel distention without evidence for intraperitoneal free air. Ileus or small-bowel obstruction could have this appearance. Central clustering of small bowel raises the  question for bowel herniation into a ventral hernia which was seen on the previous CT scan. Electronically Signed   By: Misty Stanley M.D.   On: 11/22/2015 19:03   US Abdomen Limited Ruq  11/07/2015  CLINICAL DATA:  Chronic periumbilical pain for 1 month. Initial encounter. EXAM: US ABDOMEN LIMITED - RIGHT UPPER QUADRANT COMPARISON:  CT of the abdomen and pelvis from 05/03/2014 FINDINGS: Gallbladder: Stones and sludge are noted dependently within the gallbladder. The gallbladder is otherwise unremarkable. No significant gallbladder wall thickening or pericholecystic fluid is seen. No ultrasonographic Murphy's sign is elicited. Common bile duct: Diameter: 0.6 cm, within normal limits in caliber. Liver: No focal lesion identified. Within normal limits in parenchymal echogenicity. IMPRESSION: 1. No acute abnormality seen at the right upper quadrant. 2. Stones and sludge seen dependently within the gallbladder. Gallbladder otherwise unremarkable in appearance. Electronically Signed   By: Garald Balding M.D.   On: 11/07/2015 01:20    Time Spent in minutes  25   Louellen Molder M.D on 11/27/2015 at 12:22 PM  Between 7am to 7pm - Pager - [phone]  After 7pm go to [email or web address] - password [facility]  [facility] -  Office  [phone]

## 2015-11-27 NOTE — Progress Notes (Signed)
Cross cover [facility] Subjective: Patient denies having any active GI issues at this time. Admitted with melena and CGE. Noted to have a large mass in the pancreatic head suspicious for adenocarcinoma ?SVT.Marland Kitchen He claims his son was visiting last night but will not be here today. He may come tomorrow. His labs are concerning for ongoing GI blood loss with hemoglobin down from 9.9-7.5 gm/dl. He will need an EGD when we have permission from him and his son which has been the problem with regards to his workup.  Objective: Vital signs in last 24 hours: Temp:  [98 F (36.7 C)-98.9 F (37.2 C)] 98.3 F (36.8 C) (06/17 0700) Pulse Rate:  [69-125] 91 (06/17 0700) Resp:  [10-36] 11 (06/17 0700) BP: (105-145)/(29-106) 105/42 mmHg (06/17 0700) SpO2:  [93 %-100 %] 99 % (06/17 0700) Weight:  [69.8 kg (153 lb 14.1 oz)] 69.8 kg (153 lb 14.1 oz) (06/16 1239) Last BM Date: 11/19/15  Intake/Output from previous day: 06/16 0701 - 06/17 0700 In: 320.4 [P.O.:120; I.V.:100.4; IV Piggyback:100] Out: -98 [Emesis/NG output:200; Stool:2] Intake/Output this shift:    General appearance: alert, cooperative, appears stated age, fatigued and no distress Resp: clear to auscultation bilaterally Cardio: regular rate and rhythm, S1, S2 normal, no murmur, click, rub or gallop GI: soft, non-tender; bowel sounds normal; no masses,  no organomegaly  Lab Results:  Recent Labs  11/24/15 1838 11/26/15 1236 11/26/15 2020 11/27/15 0726  WBC 12.0* 9.4  --   --   HGB 9.9* 8.7* 7.8* 7.6*  7.5*  HCT 31.5* 27.5*  --   --   PLT 192 205  --   --    BMET  Recent Labs  11/24/15 1838 11/26/15 1236  NA 133* 135  K 3.3* 3.1*  CL 101 99*  CO2 20* 22  GLUCOSE 135* 197*  BUN 52* 47*  CREATININE 10.41* 7.09*  CALCIUM 7.9* 7.9*   LFT  Recent Labs  11/26/15 1236  ALBUMIN 2.5*   Studies/Results: Mr Abdomen Wo Contrast  11/25/2015  CLINICAL DATA:  Evaluate pancreas mass EXAM: MRI ABDOMEN WITHOUT CONTRAST TECHNIQUE:  Multiplanar multisequence MR imaging was performed without the administration of intravenous contrast. COMPARISON:  11/22/2015 FINDINGS: Exam detail diminished due to motion artifact as well as lack of IV contrast material. Lower chest:  No pleural fluid identified. Hepatobiliary: Abnormal signal of the liver in spleen are noted suggestive of hemochromatosis. No mass identified. Normal appearance of the gallbladder. No biliary dilatation identified. Pancreas: There is abnormal dilatation of the pancreatic duct from the body through the tail. Within the head and neck of pancreas there is a and ill defined area of soft tissue infiltration which is difficult to assess due to lack of IV contrast material. This measures 5.6 cm transverse 2.9 cm AP and 5.9 cm in length. Spleen: The spleen appears enlarged measuring 16 cm in length. No focal splenic abnormality identified. Adrenals/Urinary Tract: The adrenal glands are normal. Changes of bilateral polycystic kidneys identified. Stomach/Bowel: The stomach appears normal. No pathologic dilatation of the upper abdominal bowel loops. Vascular/Lymphatic: Aortic atherosclerosis noted. No aneurysm. The portal vein is not visualized and is presumably occluded by mass. They SMA and celiac arteries have a common origin and both appear partially encased by this mass.No retroperitoneal adenopathy identified peripancreatic lymph node is enlarged measuring 1.8 cm, image 47 of series 11. Other: No significant free fluid or abnormal fluid collections identified. Musculoskeletal: There is diffuse abnormal signal throughout the bone marrow suggestive of iron overload. IMPRESSION: 1.  Poorly defined mass involving the head of pancreas and obstructing the pancreatic duct is again noted. Suspicious for pancreatic adenocarcinoma. This is difficult to further characterize above what was seen on CT from 11/22/2015. This is largely a reflection of lack of IV contrast material (due to chronic kidney  disease) and motion artifact. There appears to be involvement of the celiac and SMA arteries and occlusion of the portal vein. Assuming no no allergy to IV contrast material then further assessment of this lesion with pancreas protocol CT of the abdomen would be advised followed by dialysis. 2. Abnormal signal within the bone marrow, liver and spleen is suggestive of hemochromatosis. 3. Aortic atherosclerosis 4. Bilateral end-stage polycystic kidneys. Electronically Signed   By: Kerby Moors M.D.   On: 11/25/2015 11:29   Medications: I have reviewed the patient's current medications.  Assessment/Plan: 1) iron deficiency anemia with melena and coffee-ground emesis rule out gastric varices as there is  A question of SVT on the CT scan .  2) Large ill-defined mass in the pancreatic head suspicious for adenocarcinoma. the celiac and SMA arteries are involved with occlusion of the portal vein. Workup on hold as the patient has not been cooperative and has not consented to procedures. I will try to contact his son tonight and see faking" care is remission to proceed with an EGD tomorrow. 3) ESRD on HD.  4) CAD/PAD/Chronic systolic CHF. 5) S/P AKA-left limb.  6) Anxiety and depression.   LOS: 5 days   Deolinda Frid 11/27/2015, 10:21 AM

## 2015-11-27 NOTE — Progress Notes (Signed)
[facility] Progress Note  Assessment: 1  C Diff per PCR: On flagyl/vanc per primary 2  Recent gallstone pancreatitis 3  Pancreatic mass - per MRI "Poorly defined mass involving the head of pancreas and obstructing the pancreatic duct is again noted. 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, stopped binders, cont VDRA- actually hectorol just restarted , previous calciphylaxis but wounds healed and iPTH had been trending up 7  DM: Per primary 8  Volume - is stable, 2kg under dry wt  Plan - HD Monday   [name] MD [place] pager [phone]    cell [phone] 11/27/2015, 10:47 AM    Subjective:   No more vomiting, Hb 7.5 this am  Objective Filed Vitals:   11/27/15 0000 11/27/15 0100 11/27/15 0411 11/27/15 0700  BP: 112/59 107/29 112/57 105/42  Pulse: 97 91 90 91  Temp:   98.5 F (36.9 C) 98.3 F (36.8 C)  TempSrc:   Oral Oral  Resp: 15 10 13 11   Height:      Weight:      SpO2: 100% 97% 100% 99%   Physical Exam General:NAD Heart:tachyreg Lungs: no rales Abdomen: soft NT Extremities: left BKA tr RLE edema Dialysis Access: left lower AVGG + bruit  Dialysis Orders: 4 hrs 400/500 EDW 71 kg 2.0K/2.25 Ca UF profile 4 Linear NA L AVG Heparin 5000 units IV q treatment Mircera 75 mcg IV q 4 weeks (last dose 11/12/15 HGB 10.3 11/17/15) Hectorol 3 mcg IV q tx (last PTH 1794 10/07/15) Additional Objective Labs: Basic Metabolic Panel:  Recent Labs Lab 11/23/15 0220 11/24/15 1838 11/26/15 1236  NA 135 133* 135  K 3.4* 3.3* 3.1*  CL 102 101 99*  CO2 20* 20* 22  GLUCOSE 104* 135* 197*  BUN 37* 52* 47*  CREATININE 8.52* 10.41* 7.09*  CALCIUM 7.4* 7.9* 7.9*  PHOS  --   --  1.5*   Liver Function Tests:  Recent Labs Lab 11/22/15 1955 11/26/15 1236   AST 19  --   ALT 10*  --   ALKPHOS 144*  --   BILITOT 1.7*  --   PROT 7.2  --   ALBUMIN 3.1* 2.5*    Recent Labs Lab 11/22/15 1955  LIPASE 34   CBC:  Recent Labs Lab 11/22/15 1955 11/23/15 0220 11/24/15 1838 11/26/15 1236 11/26/15 2020 11/27/15 0726  WBC 9.5 8.2 12.0* 9.4  --   --   NEUTROABS 7.5  --   --   --   --   --   HGB 10.9* 9.2* 9.9* 8.7* 7.8* 7.6*  7.5*  HCT 34.9* 29.3* 31.5* 27.5*  --   --   MCV 83.5 82.5 80.6 82.1  --   --   PLT 119* 108* 192 205  --   --    Blood Culture    Component Value Date/Time   SDES BLOOD RIGHT HAND 11/22/2015 1955   SPECREQUEST BOTTLES DRAWN AEROBIC AND ANAEROBIC 5CC 11/22/2015 1955   CULT NO GROWTH 4 DAYS 11/22/2015 1955   REPTSTATUS PENDING 11/22/2015 1955   CBG:  Recent Labs Lab 11/25/15 2040 11/26/15 0908 11/26/15 1153 11/26/15 1601 11/26/15 2116  GLUCAP 124* 171* 219* 171* 146*    Studies/Results: Mr Abdomen Wo Contrast  11/25/2015  CLINICAL DATA:  Evaluate  pancreas mass EXAM: MRI ABDOMEN WITHOUT CONTRAST TECHNIQUE: Multiplanar multisequence MR imaging was performed without the administration of intravenous contrast. COMPARISON:  11/22/2015 FINDINGS: Exam detail diminished due to motion artifact as well as lack of IV contrast material. Lower chest:  No pleural fluid identified. Hepatobiliary: Abnormal signal of the liver in spleen are noted suggestive of hemochromatosis. No mass identified. Normal appearance of the gallbladder. No biliary dilatation identified. Pancreas: There is abnormal dilatation of the pancreatic duct from the body through the tail. Within the head and neck of pancreas there is a and ill defined area of soft tissue infiltration which is difficult to assess due to lack of IV contrast material. This measures 5.6 cm transverse 2.9 cm AP and 5.9 cm in length. Spleen: The spleen appears enlarged measuring 16 cm in length. No focal splenic abnormality identified. Adrenals/Urinary Tract: The adrenal glands  are normal. Changes of bilateral polycystic kidneys identified. Stomach/Bowel: The stomach appears normal. No pathologic dilatation of the upper abdominal bowel loops. Vascular/Lymphatic: Aortic atherosclerosis noted. No aneurysm. The portal vein is not visualized and is presumably occluded by mass. They SMA and celiac arteries have a common origin and both appear partially encased by this mass.No retroperitoneal adenopathy identified peripancreatic lymph node is enlarged measuring 1.8 cm, image 47 of series 11. Other: No significant free fluid or abnormal fluid collections identified. Musculoskeletal: There is diffuse abnormal signal throughout the bone marrow suggestive of iron overload. IMPRESSION: 1. Poorly defined mass involving the head of pancreas and obstructing the pancreatic duct is again noted. Suspicious for pancreatic adenocarcinoma. This is difficult to further characterize above what was seen on CT from 11/22/2015. This is largely a reflection of lack of IV contrast material (due to chronic kidney disease) and motion artifact. There appears to be involvement of the celiac and SMA arteries and occlusion of the portal vein. Assuming no no allergy to IV contrast material then further assessment of this lesion with pancreas protocol CT of the abdomen would be advised followed by dialysis. 2. Abnormal signal within the bone marrow, liver and spleen is suggestive of hemochromatosis. 3. Aortic atherosclerosis 4. Bilateral end-stage polycystic kidneys. Electronically Signed   By: Kerby Moors M.D.   On: 11/25/2015 11:29   Medications: . sodium chloride 10 mL/hr at 11/26/15 2235  . pantoprozole (PROTONIX) infusion 8 mg/hr (11/27/15 0226)   . aspirin  81 mg Oral Daily  . atorvastatin  20 mg Oral QHS  . B-complex with vitamin C  1 tablet Oral QHS  . feeding supplement (PRO-STAT SUGAR FREE 64)  30 mL Oral Daily  . gabapentin  100 mg Oral QHS  . insulin aspart  0-5 Units Subcutaneous QHS  . insulin  aspart  0-9 Units Subcutaneous TID WC  . metronidazole  500 mg Intravenous Q8H  . midodrine  5 mg Oral 3 times per day on Sun Tue Thu Sat   And  . midodrine  5 mg Oral 2 times per day on Mon Wed Fri  . [START ON 11/30/2015] pantoprazole  40 mg Intravenous Q12H  . sodium chloride flush  3 mL Intravenous Q12H  . vancomycin  500 mg Oral Q6H

## 2015-11-28 ENCOUNTER — Encounter (HOSPITAL_COMMUNITY)
Admission: EM | Disposition: A | Discharge status: Skilled Nursing Facility | Payer: Self-pay | Source: Home / Self Care | Attending: Internal Medicine

## 2015-11-28 ENCOUNTER — Encounter (HOSPITAL_COMMUNITY): Payer: Self-pay

## 2015-11-28 HISTORY — PX: ESOPHAGOGASTRODUODENOSCOPY: SHX5428

## 2015-11-28 LAB — GLUCOSE, CAPILLARY
GLUCOSE-CAPILLARY: 161 mg/dL — AB (ref 65–99)
Glucose-Capillary: 93 mg/dL (ref 65–99)
Glucose-Capillary: 87 mg/dL (ref 65–99)

## 2015-11-28 LAB — HEMOGLOBIN
HEMOGLOBIN: 7.2 g/dL — AB (ref 13.0–17.0)
Hemoglobin: 7.4 g/dL — ABNORMAL LOW (ref 13.0–17.0)
Hemoglobin: 7.5 g/dL — ABNORMAL LOW (ref 13.0–17.0)

## 2015-11-28 LAB — PREPARE RBC (CROSSMATCH)

## 2015-11-28 SURGERY — EGD (ESOPHAGOGASTRODUODENOSCOPY)
Anesthesia: Moderate Sedation | Laterality: Left

## 2015-11-28 MED ORDER — LIDOCAINE VISCOUS 2 % MT SOLN
OROMUCOSAL | Status: AC
  Filled 2015-11-28: qty 15

## 2015-11-28 MED ORDER — GERHARDT'S BUTT CREAM
TOPICAL_CREAM | Freq: Four times a day (QID) | CUTANEOUS | Status: DC | PRN
  Filled 2015-11-28: qty 1

## 2015-11-28 MED ORDER — MIDAZOLAM HCL 10 MG/2ML IJ SOLN
INTRAMUSCULAR | Status: DC | PRN
  Administered 2015-11-28: 1 mg via INTRAVENOUS
  Administered 2015-11-28: 2 mg via INTRAVENOUS

## 2015-11-28 MED ORDER — SODIUM CHLORIDE 0.9 % IV SOLN: INTRAVENOUS | Status: DC

## 2015-11-28 MED ORDER — MIDAZOLAM HCL 5 MG/ML IJ SOLN
INTRAMUSCULAR | Status: AC
  Filled 2015-11-28: qty 1

## 2015-11-28 MED ORDER — FENTANYL CITRATE (PF) 100 MCG/2ML IJ SOLN
INTRAMUSCULAR | Status: DC | PRN
  Administered 2015-11-28 (×2): 25 ug via INTRAVENOUS

## 2015-11-28 MED ORDER — FENTANYL CITRATE (PF) 100 MCG/2ML IJ SOLN
INTRAMUSCULAR | Status: AC
  Filled 2015-11-28: qty 2

## 2015-11-28 MED ORDER — SODIUM CHLORIDE 0.9 % IV SOLN: Freq: Once | INTRAVENOUS | Status: DC

## 2015-11-28 NOTE — Progress Notes (Signed)
[facility] Progress Note  Assessment: 1  Hematemesis/ melena - for prob EGD this am per GI notes 2  Pancreatic mass - per MRI "poorly defined mass involving the head of pancreas and obstructing the pancreatic duct.... 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, stopped binders,  hectorol just restarted , previous calciphylaxis but wounds healed and iPTH had been trending up 8  DM: Per primary 9  Volume - is stable, 1kg under dry wt  Plan - HD Monday, min UF, no hep   [name] MD [facility] pager [phone]    cell [phone] 11/28/2015, 6:35 AM    Subjective:   Hb up to 9.7 then down to 7.4 this am, pt feels "weak and tired"  Objective Filed Vitals:   11/27/15 1909 11/27/15 2100 11/27/15 2351 11/28/15 0339  BP:  128/45 133/48 117/46  Pulse:  88 93 95  Temp:  98 F (36.7 C) 98.1 F (36.7 C) 98.4 F (36.9 C)  TempSrc:  Oral Oral Oral  Resp: 20 20 16 23   Height:      Weight:    70.3 kg (154 lb 15.7 oz)  SpO2:  100% 100% 100%   Physical Exam General:NAD Heart:tachyreg Lungs: no rales Abdomen: soft NT Extremities: left BKA tr RLE edema Dialysis Access: left lower AVGG + bruit  Dialysis: [facility]  4h  400/500  71kg  2/2.25 bath  P4  LFA AVG  Hep 5000 Mircera 75 mcg IV q 4 weeks (last dose 11/12/15 HGB 10.3 11/17/15) Hectorol 3 mcg IV q tx (last PTH 1794 10/07/15)  Additional Objective Labs: Basic Metabolic Panel:  Recent Labs Lab 11/23/15 0220 11/24/15 1838 11/26/15 1236  NA 135 133* 135  K 3.4* 3.3* 3.1*  CL 102 101 99*  CO2 20* 20* 22  GLUCOSE 104* 135* 197*  BUN 37* 52* 47*  CREATININE 8.52* 10.41* 7.09*  CALCIUM 7.4* 7.9* 7.9*  PHOS  --   --  1.5*    Liver Function Tests:  Recent Labs Lab 11/22/15 1955 11/26/15 1236  AST 19  --   ALT 10*  --   ALKPHOS 144*  --   BILITOT 1.7*  --   PROT 7.2  --   ALBUMIN 3.1* 2.5*    Recent Labs Lab 11/22/15 1955  LIPASE 34   CBC:  Recent Labs Lab 11/22/15 1955 11/23/15 0220 11/24/15 1838 11/26/15 1236  11/27/15 1310 11/27/15 1944 11/28/15 0527  WBC 9.5 8.2 12.0* 9.4  --   --   --   --   NEUTROABS 7.5  --   --   --   --   --   --   --   HGB 10.9* 9.2* 9.9* 8.7*  < > 8.2* 9.7* 7.4*  HCT 34.9* 29.3* 31.5* 27.5*  --   --   --   --   MCV 83.5 82.5 80.6 82.1  --   --   --   --   PLT 119* 108* 192 205  --   --   --   --   < > = values in this interval not displayed. Blood Culture    Component Value Date/Time   SDES BLOOD RIGHT HAND 11/22/2015  1955   SPECREQUEST BOTTLES DRAWN AEROBIC AND ANAEROBIC 5CC 11/22/2015 1955   CULT NO GROWTH 5 DAYS 11/22/2015 1955   REPTSTATUS 11/27/2015 FINAL 11/22/2015 1955   CBG:  Recent Labs Lab 11/26/15 1601 11/26/15 2116 11/27/15 1208 11/27/15 1738 11/27/15 2114  GLUCAP 171* 146* 118* 99 101*    Studies/Results: No results found. Medications: . sodium chloride 10 mL/hr at 11/26/15 2235  . pantoprozole (PROTONIX) infusion 8 mg/hr (11/28/15 0044)   . atorvastatin  20 mg Oral QHS  . B-complex with vitamin C  1 tablet Oral QHS  . feeding supplement (PRO-STAT SUGAR FREE 64)  30 mL Oral Daily  . gabapentin  100 mg Oral QHS  . insulin aspart  0-5 Units Subcutaneous QHS  . insulin aspart  0-9 Units Subcutaneous TID WC  . metronidazole  500 mg Intravenous Q8H  . midodrine  5 mg Oral 3 times per day on Sun Tue Thu Sat   And  . midodrine  5 mg Oral 2 times per day on Mon Wed Fri  . [START ON 11/30/2015] pantoprazole  40 mg Intravenous Q12H  . sodium chloride flush  3 mL Intravenous Q12H  . vancomycin  500 mg Oral Q6H

## 2015-11-28 NOTE — Op Note (Signed)
[facility] Patient Name: Sean [name] Procedure Date : 11/28/2015 MRN: [id number] Attending MD: Juanita Craver , MD Date of Birth: 05/10/1936 CSN: [id number] Age: 80 Admit Type: Inpatient Procedure:                Diagnostic EGD. Indications:              Acute post hemorrhagic anemia, Melena, Coffee                            ground emesis. Providers:                Juanita Craver, MD, Carolynn Comment, RN, Corliss Parish, Technician. Referring MD:             THP Medicines:                Fentanyl 50 micrograms & IV, Midazolam 3 mg IV Complications:            No immediate complications. Estimated Blood Loss:     Estimated blood loss: none. Estimated blood loss:                            none. Procedure:                Pre-anesthesia assessment: - Prior to the                            procedure, a history and physical was performed,                            and patient medications and allergies were                            reviewed. The patient's tolerance of previous                            anesthesia was also reviewed. The risks and                            benefits of the procedure and the sedation options                            and risks were discussed with the patient. All                            questions were answered, and informed consent was                            obtained. Prior anticoagulants: The patient has                            taken no previous anticoagulant or antiplatelet  agents. ASA Grade Assessment: III - A patient with                            severe systemic disease. After reviewing the risks                            and benefits, the patient was deemed in                            satisfactory condition to undergo the procedure.                            After obtaining informed consent, the endoscope was                            passed under direct vision.  Throughout the                            procedure, the patient's blood pressure, pulse, and                            oxygen saturations were monitored continuously. The                            [id number]) scope was introduced through the                            mouth, and advanced to the second part of duodenum.                            The EGD was accomplished without difficulty. The                            patient tolerated the procedure well. Scope In: Scope Out: Findings:      There was Grade 1 esophagitis noted in the distal esophagus; the rest of       the esophagus appeared normal.      The entire examined stomach was normal.      The cardia and gastric fundus were normal on retroflexion.      One non-bleeding cratered duodenal ulcer with no stigmata of bleeding       was found in the duodenal bulb.      The post-bulbar portion of the duodenum appeared normal.      There were no esophageal, gastric or duodenal varices noted. Impression:               - Grade 1 esophagitis in the distal esophagus.                           - Normal appearing stomach on antegrade and                            retroflexed views.                           -  Cratered ulcer in the duodenal bulb.                           - Normal appearing post-bulbar duodenum.                           - No varices noted. Moderate Sedation:      Moderate (conscious) sedation was administered by the endoscopy nurse       and supervised by the endoscopist. The following parameters were       monitored: oxygen saturation, heart rate, blood pressure, respiratory       rate, EKG, adequacy of pulmonary ventilation, and response to care.       Total physician intraservice time was 10 minutes. Recommendation:           - Full liquid diet today.                           - Continue present medications.                           - No Aspirin, Ibuprofen, Naproxen, or other                             non-steroidal anti-inflammatory drugs.                           - Finding discussed with the patient's son-he is                            considering comfort care measures. Procedure Code(s):        --- Professional ---                           [id number], Esophagogastroduodenoscopy, flexible,                            transoral; diagnostic, including collection of                            specimen(s) by brushing or washing, when performed                            (separate procedure) Diagnosis Code(s):        --- Professional ---                           K92.1, Melena (includes Hematochezia)                           D62, Acute posthemorrhagic anemia                           K92.0, Hematemesis CPT copyright 2016 [employer]. All rights reserved. The codes documented in this report are preliminary and upon coder review may  be revised to meet current compliance requirements. Juanita Craver, MD Juanita Craver, MD 11/28/2015 1:21:14 PM This report has been signed electronically. Number of Addenda:  0 

## 2015-11-28 NOTE — Progress Notes (Addendum)
Notified Md about pt's hgb 7.4 this am.  Pt had 3 sm dark tarry stools over the night.  Vs stable. No new orders at this time.   Will continue to monitor. Saunders Revel T

## 2015-11-28 NOTE — Progress Notes (Addendum)
PROGRESS NOTE                                                                                                                                                                                                             Patient Demographics:    Sean [name], is a 80 y.o. male, DOB - 1935/07/13, [id number]  Admit date - 11/22/2015   Admitting Physician Ivor Costa, MD  Outpatient Primary MD for the patient is [name], MD  LOS - 6  Outpatient Specialists: Nephrology  Chief Complaint  Patient presents with  . Diarrhea       Brief Narrative   80 year old male with history of ischemic colitis status post bowel resection, cholelithiasis, recent hospitalization for gallstone pancreatitis, ESRD on dialysis (M, W, F), status post left AKA, hypotension, anemia, anxiety and depression, diet-controlled diabetes mellitus presented with abdominal pain as stated with fever and diarrhea for the past 3 days. Reported 6-7 bowel movements with loose stools with periumbilical pain, [id number] in severe at the nonradiating. Reported nausea but no vomiting. He reported having fever of 103F at home.  In the ED stool first C. difficile was positive, had mildly elevated lactic acid and had low-grade temperature. Creatinine was 8.69. CT abdomen pelvis done in the ED showed mild infectious versus inflammatory colitis of the descending, sigmoid colon and rectum. Also showed suspicious infiltrating pancreatic neoplasm.  Patient refused further CT imaging with pancreatic protocol and plan for biopsy as outpatient. He was planned to be discharged back to skilled nursing facility on 6/16 after dialysis. However during dialysis he had 2 episodes of coffee-ground emesis and further dialysis was aborted. Repeat CBC showed some drop in H&H. GI consulted who evaluated the patient and offered EGD but he refused. Patient was placed on Protonix drip , Made  nothing by mouth and transfer to stepdown unit.  He was transferred to my care on 11/28/2015 on day 6 of his hospital stay.   Subjective:   Patient in bed, denies any headache, no chest or abdominal pain. No focal weakness.   Assessment  & Plan :   Sepsis with C. difficile colitis - Sepsis resolved. On oral vancomycin and Flagyl. Diarrhea resolved... Plan on 2 weeks of antibiotics total (stop date 12/06/2015)  Acute upper GI bleed - Suspect this could be variceal bleed based on occlusion of  portal vein seen on MRI. Some drop in H&H noted today, Will monitor and transfuse as needed, Continue PPI drip. Following due for EGD on 11/28/2015, he initially refused this procedure.  Recent history of gallstone pancreatitis - Was scheduled for cholecystectomy on 6/14. Surgery was consulted who recommended holding off until acute illness had resolved. No clinical signs or symptoms of acute pancreatitis. Should follow-up with surgery in 2 weeks post DC.  ? Pancreatic adenocarcinoma -  MRI without contrast done shows poorly defined mass over head of pancreas and obstructive pancreatic duct suspicious for adenocarcinoma. Possible involvement of SMA , celiac arteries and occlusion of portal vein. Recommend CT abd with contrast with pancreatic protocol. CA 19-9 markedly elevated to 2400. Patient refused CT with pancreatic protocol. Discussed with family may get him to do the workup outpatient.   End stage renal disease on dialysis [facility]) renal on board getting dialysis per schedule, he is on Monday, Wednesday and Friday schedule.  CAD Hold aspirin due to GI bleed. Continue Lipitor   Chronic systolic CHF (congestive heart failure) ([place]): Euvolemic. Volume management with dialysis.  Diet-controlled diabetes mellitus Monitor on SSI.  Lab Results  Component Value Date   HGBA1C 6.6 07/07/2015   CBG (last 3)   Recent Labs  11/27/15 1738 11/27/15 2114 11/28/15 0757  GLUCAP 99 101* 93      Code  Status : Full code  Family Communication  : Son and daughter on the phone on 11/28/2015  Disposition Plan  : To skilled nursing facility once acute issues resolve  Barriers For Discharge : GI bleed  Consults  :    Renal GI  Procedures  :  CT abdomen without contrast MRI abdomen without contrast  DVT Prophylaxis  :  SCDs  Lab Results  Component Value Date   PLT 205 11/26/2015    Antibiotics  :    Anti-infectives    Start     Dose/Rate Route Frequency Ordered Stop   11/26/15 0000  vancomycin (VANCOCIN) 50 mg/mL oral solution     500 mg Oral Every 6 hours 11/26/15 1159 12/06/15 2359   11/26/15 0000  metroNIDAZOLE (FLAGYL) 500 MG tablet     500 mg Oral 3 times daily 11/26/15 1159 12/06/15 2359   11/23/15 0830  metroNIDAZOLE (FLAGYL) IVPB 500 mg     500 mg 100 mL/hr over 60 Minutes Intravenous Every 8 hours 11/23/15 0814     11/23/15 0115  vancomycin (VANCOCIN) 50 mg/mL oral solution 500 mg     500 mg Oral Every 6 hours 11/23/15 0113 12/06/15 2359   11/22/15 2359  metroNIDAZOLE (FLAGYL) IVPB 500 mg  Status:  Discontinued     500 mg 100 mL/hr over 60 Minutes Intravenous Every 8 hours 11/22/15 2311 11/23/15 0113   11/22/15 2359  ciprofloxacin (CIPRO) IVPB 400 mg  Status:  Discontinued    Comments:  Cipro 400 mg IV q24h for CrCl < 30 mL/min   400 mg 200 mL/hr over 60 Minutes Intravenous Daily at bedtime 11/22/15 2332 11/23/15 0113   11/22/15 2200  piperacillin-tazobactam (ZOSYN) IVPB 2.25 g  Status:  Discontinued     2.25 g 100 mL/hr over 30 Minutes Intravenous Every 8 hours 11/22/15 1631 11/22/15 2310   11/22/15 1630  piperacillin-tazobactam (ZOSYN) IVPB 3.375 g  Status:  Discontinued     3.375 g 100 mL/hr over 30 Minutes Intravenous  Once 11/22/15 1616 11/22/15 2200        Objective:   Filed Vitals:  11/27/15 2351 11/28/15 0339 11/28/15 0759 11/28/15 0800  BP: 133/48 117/46 136/74 136/74  Pulse: 93 95 100 102  Temp: 98.1 F (36.7 C) 98.4 F (36.9 C) 98.4 F  (36.9 C)   TempSrc: Oral Oral Oral   Resp: 16 23 17 22   Height:  5\' 5"  (1.651 m)    Weight:  70.3 kg (154 lb 15.7 oz)    SpO2: 100% 100% 99% 100%    Wt Readings from Last 3 Encounters:  11/28/15 70.3 kg (154 lb 15.7 oz)  11/12/15 85.73 kg (189 lb)  11/11/15 85.73 kg (189 lb)     Intake/Output Summary (Last 24 hours) at 11/28/15 0901 Last data filed at 11/28/15 0800  Gross per 24 hour  Intake 1067.5 ml  Output      0 ml  Net 1067.5 ml     Physical Exam  Gen: not in distress Awake alert, flat affect Good movement bilaterally, no rhonchi no wheezes Regular rate rhythm normal S1-S2 Abdomen soft positive bowel sounds nontender Old left AKA.   Data Review:    CBC  Recent Labs Lab 11/22/15 1955 11/23/15 0220 11/24/15 1838 11/26/15 1236 11/26/15 2020 11/27/15 0726 11/27/15 1310 11/27/15 1944 11/28/15 0527  WBC 9.5 8.2 12.0* 9.4  --   --   --   --   --   HGB 10.9* 9.2* 9.9* 8.7* 7.8* 7.6*  7.5* 8.2* 9.7* 7.4*  HCT 34.9* 29.3* 31.5* 27.5*  --   --   --   --   --   PLT 119* 108* 192 205  --   --   --   --   --   MCV 83.5 82.5 80.6 82.1  --   --   --   --   --   MCH 26.1 25.9* 25.3* 26.0  --   --   --   --   --   MCHC 31.2 31.4 31.4 31.6  --   --   --   --   --   RDW 16.5* 16.4* 16.9* 17.5*  --   --   --   --   --   LYMPHSABS 0.6*  --   --   --   --   --   --   --   --   MONOABS 1.4*  --   --   --   --   --   --   --   --   EOSABS 0.0  --   --   --   --   --   --   --   --   BASOSABS 0.0  --   --   --   --   --   --   --   --     Chemistries   Recent Labs Lab 11/22/15 1955 11/23/15 0220 11/24/15 1838 11/26/15 1236  NA 136 135 133* 135  K 3.5 3.4* 3.3* 3.1*  CL 99* 102 101 99*  CO2 23 20* 20* 22  GLUCOSE 107* 104* 135* 197*  BUN 35* 37* 52* 47*  CREATININE 8.69* 8.52* 10.41* 7.09*  CALCIUM 8.0* 7.4* 7.9* 7.9*  AST 19  --   --   --   ALT 10*  --   --   --   ALKPHOS 144*  --   --   --   BILITOT 1.7*  --   --   --     ------------------------------------------------------------------------------------------------------------------ No results for input(s): CHOL, HDL,  LDLCALC, TRIG, CHOLHDL, LDLDIRECT in the last 72 hours.  Lab Results  Component Value Date   HGBA1C 6.6 07/07/2015   ------------------------------------------------------------------------------------------------------------------ No results for input(s): TSH, T4TOTAL, T3FREE, THYROIDAB in the last 72 hours.  Invalid input(s): FREET3 ------------------------------------------------------------------------------------------------------------------ No results for input(s): VITAMINB12, FOLATE, FERRITIN, TIBC, IRON, RETICCTPCT in the last 72 hours.  Coagulation profile  Recent Labs Lab 11/22/15 0010  INR 1.49    No results for input(s): DDIMER in the last 72 hours.  Cardiac Enzymes No results for input(s): CKMB, TROPONINI, MYOGLOBIN in the last 168 hours.  Invalid input(s): CK ------------------------------------------------------------------------------------------------------------------ No results found for: BNP  Inpatient Medications  Scheduled Meds: . atorvastatin  20 mg Oral QHS  . B-complex with vitamin C  1 tablet Oral QHS  . feeding supplement (PRO-STAT SUGAR FREE 64)  30 mL Oral Daily  . gabapentin  100 mg Oral QHS  . insulin aspart  0-5 Units Subcutaneous QHS  . insulin aspart  0-9 Units Subcutaneous TID WC  . metronidazole  500 mg Intravenous Q8H  . midodrine  5 mg Oral 3 times per day on Sun Tue Thu Sat   And  . midodrine  5 mg Oral 2 times per day on Mon Wed Fri  . [START ON 11/30/2015] pantoprazole  40 mg Intravenous Q12H  . sodium chloride flush  3 mL Intravenous Q12H  . vancomycin  500 mg Oral Q6H   Continuous Infusions: . sodium chloride 10 mL/hr at 11/26/15 2235  . pantoprozole (PROTONIX) infusion 8 mg/hr (11/28/15 0044)   PRN Meds:.acetaminophen, alum & mag hydroxide-simeth,  HYDROcodone-acetaminophen, hydrOXYzine, morphine injection  Micro Results Recent Results (from the past 240 hour(s))  Blood Culture (routine x 2)     Status: None   Collection Time: 11/22/15  7:50 PM  Result Value Ref Range Status   Specimen Description BLOOD RIGHT ARM  Final   Special Requests BOTTLES DRAWN AEROBIC AND ANAEROBIC 5CC  Final   Culture NO GROWTH 5 DAYS  Final   Report Status 11/27/2015 FINAL  Final  Blood Culture (routine x 2)     Status: None   Collection Time: 11/22/15  7:55 PM  Result Value Ref Range Status   Specimen Description BLOOD RIGHT HAND  Final   Special Requests BOTTLES DRAWN AEROBIC AND ANAEROBIC 5CC  Final   Culture NO GROWTH 5 DAYS  Final   Report Status 11/27/2015 FINAL  Final  C difficile quick scan w PCR reflex     Status: Abnormal   Collection Time: 11/22/15 11:27 PM  Result Value Ref Range Status   C Diff antigen POSITIVE (A) NEGATIVE Final   C Diff toxin POSITIVE (A) NEGATIVE Final   C Diff interpretation Positive for toxigenic C. difficile  Final    Comment: CRITICAL RESULT CALLED TO, READ BACK BY AND VERIFIED WITH: B OLDAND,RN @0100  11/23/15 MKELLY   Gastrointestinal Panel by PCR , Stool     Status: None   Collection Time: 11/22/15 11:27 PM  Result Value Ref Range Status   Campylobacter species NOT DETECTED NOT DETECTED Final   Plesimonas shigelloides NOT DETECTED NOT DETECTED Final   Salmonella species NOT DETECTED NOT DETECTED Final   Yersinia enterocolitica NOT DETECTED NOT DETECTED Final   Vibrio species NOT DETECTED NOT DETECTED Final   Vibrio cholerae NOT DETECTED NOT DETECTED Final   Enteroaggregative E coli (EAEC) NOT DETECTED NOT DETECTED Final   Enteropathogenic E coli (EPEC) NOT DETECTED NOT DETECTED Final   Enterotoxigenic E coli (ETEC) NOT  DETECTED NOT DETECTED Final   Shiga like toxin producing E coli (STEC) NOT DETECTED NOT DETECTED Final   E. coli O157 NOT DETECTED NOT DETECTED Final   Shigella/Enteroinvasive E coli  (EIEC) NOT DETECTED NOT DETECTED Final   Cryptosporidium NOT DETECTED NOT DETECTED Final   Cyclospora cayetanensis NOT DETECTED NOT DETECTED Final   Entamoeba histolytica NOT DETECTED NOT DETECTED Final   Giardia lamblia NOT DETECTED NOT DETECTED Final   Adenovirus F40/41 NOT DETECTED NOT DETECTED Final   Astrovirus NOT DETECTED NOT DETECTED Final   Norovirus GI/GII NOT DETECTED NOT DETECTED Final   Rotavirus A NOT DETECTED NOT DETECTED Final   Sapovirus (I, II, IV, and V) NOT DETECTED NOT DETECTED Final  MRSA PCR Screening     Status: None   Collection Time: 11/23/15  6:22 AM  Result Value Ref Range Status   MRSA by PCR NEGATIVE NEGATIVE Final    Comment:        The GeneXpert MRSA Assay (FDA approved for NASAL specimens only), is one component of a comprehensive MRSA colonization surveillance program. It is not intended to diagnose MRSA infection nor to guide or monitor treatment for MRSA infections.     Radiology Reports Ct Abdomen Pelvis Wo Contrast  11/22/2015  CLINICAL DATA:  Abdominal pain beginning over the last 2 days. No vomiting. Recent diagnosis of abdominal blockage. EXAM: CT ABDOMEN AND PELVIS WITHOUT CONTRAST TECHNIQUE: Multidetector CT imaging of the abdomen and pelvis was performed following the standard protocol without IV contrast. COMPARISON:  CTs, 11/07/2015 and 05/03/2014. FINDINGS: Lung bases: Heart is mildly enlarged. There are dense coronary artery calcifications. There is patchy and linear lung base opacity most evident in the left lower lobe, likely atelectasis. Pneumonia is possible. No pulmonary edema. No pleural effusion. Hepatobiliary: Liver is unremarkable. Gallbladder is distended. There are dependent stones but no convincing wall thickening or pericholecystic fluid. No bile duct dilation. Spleen: Mild enlargement measuring 16.5 x 6.7 x 15.4 cm. No splenic mass or focal lesion. Stable. Pancreas: Pancreas is thickened, particularly the pancreatic head,  where it surrounds the celiac axis and superior mesenteric artery vasculature. Lower attenuation areas in the pancreatic tail suggest poorly defined cysts or an irregular dilated duct. Margins of the pancreas are ill-defined. This appearance is similar to the prior exam. Additional soft tissue attenuation extends along the gastrohepatic ligament consistent with poorly defined adenopathy. Largest presumed node measures 16 mm in short axis. Adrenal glands:  No masses. Kidneys, ureters, bladder: Marked renal parenchymal thinning. Numerous bilateral renal cysts. No hydronephrosis. Ureters normal course and in caliber. Bladder is decompressed. Prostate gland:  Mild to moderately enlarged but stable. Ascites:  None. Gastrointestinal: Mild wall thickening suggested along the descending and sigmoid colon extending to the rectum. There is no bowel dilation to suggest obstruction. There are changes from a previous partial right colectomy, stable. Vascular: There are extensive atherosclerotic calcifications throughout the aorta and branch vessels. Musculoskeletal: Large subchondral cysts are noted along the superior acetabula. There is increased density in the bony structures consistent with renal osteodystrophy. There multiple Schmorl's nodes along the spine. Abdominal wall: Small fat containing left para midline abdominal wall hernia just below the umbilicus. IMPRESSION: 1. Abnormal appearance of the pancreas. An infiltrating pancreatic neoplasm is suspected. This could be better defined with pancreatic MRI with and without contrast if the patient can tolerate that procedure. There is evidence of gastrohepatic ligament chain adenopathy, extending along the porta hepatis. 2. Mild wall thickening of the descending colon, sigmoid  colon and rectum consistent with a mild infectious or inflammatory colitis. No evidence of bowel obstruction. 3. Stable splenomegaly. 4. Distended gallbladder with dependent gallstones but no  convincing acute cholecystitis. 5. Significant renal parenchymal thinning with multiple cysts with acquired renal cystic disease. 6. Increased opacity the base of the left lower lobe, most likely atelectasis. Consider pneumonia in the proper clinical setting. Electronically Signed   By: Lajean Manes M.D.   On: 11/22/2015 19:46   Ct Abdomen Pelvis Wo Contrast  11/07/2015  CLINICAL DATA:  Abdominal pain, elevated lipase. Right upper quadrant pain. EXAM: CT ABDOMEN AND PELVIS WITHOUT CONTRAST TECHNIQUE: Multidetector CT imaging of the abdomen and pelvis was performed following the standard protocol without IV contrast. COMPARISON:  Ultrasound earlier this day.  CT 05/06/2014 FINDINGS: Lower chest: Extensive coronary artery calcifications. Calcifications at the lung hila, partially included. Probable bilateral calcified hilar lymph nodes, suboptimally assessed. Atelectasis at the lung bases. Liver: No evidence of focal lesion allowing for lack contrast. Hepatobiliary: Distended gallbladder containing dependent gallstones. Previously assessed by ultrasound. Pancreas: Suboptimally assessed without contrast, however there is diffuse soft tissue fullness. Mild peripancreatic edema. Spleen: Enlarged measuring 15.4 cm craniocaudal. Adrenal glands: Fullness bilaterally.  No discrete nodule. Kidneys: Atrophic parenchyma. Multiple bilateral renal cysts, with a large parapelvic cyst on the left. Some of the cysts appear complicated. Extensive renovascular calcifications. Stomach/Bowel: Stomach distended with ingested contents. Dilated fluid-filled proximal small bowel with transition in the mid right abdomen image 55 series 2. There is mesenteric edema and free fluid. No pneumatosis. More distal small bowel loops are decompressed. Enteric sutures noted at the base of the cecum. There is cecal and ascending colonic wall thickening. Small volume of stool throughout the remainder the colon. Vascular/Lymphatic: Dense  atherosclerosis of the abdominal aorta. No aneurysm. No definite retroperitoneal adenopathy. Reproductive: Prominent sized prostate gland causing mass effect on the bladder base. Bladder: Decompressed. Other: Small volume of free fluid in the abdomen and pelvis. No evidence of free air. Postsurgical change in the anterior abdominal wall with small fat containing periumbilical ventral abdominal wall hernia. Musculoskeletal: There are no acute or suspicious osseous abnormalities. 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 Bony fusion of the sacroiliac joints. Degenerative change throughout spine. IMPRESSION: 1. Small-bowel obstruction with transition point in the right mid abdomen. 2. Soft tissue fullness about the entire pancreas. There is mild peripancreatic edema, however diffuse mesenteric edema is also seen. Underlying pancreatic mass cannot be excluded. If patient is able tolerate breath hold technique, MRI may be of value. 3. Splenomegaly. 4. Cholelithiasis, please reference same-day right upper quadrant ultrasound. 5. Advanced atherosclerosis. 6. Additional chronic findings as described. Electronically Signed   By: Jeb Levering M.D.   On: 11/07/2015 05:55   Mr Abdomen Wo Contrast  11/25/2015  CLINICAL DATA:  Evaluate pancreas mass EXAM: MRI ABDOMEN WITHOUT CONTRAST TECHNIQUE: Multiplanar multisequence MR imaging was performed without the administration of intravenous contrast. COMPARISON:  11/22/2015 FINDINGS: Exam detail diminished due to motion artifact as well as lack of IV contrast material. Lower chest:  No pleural fluid identified. Hepatobiliary: Abnormal signal of the liver in spleen are noted suggestive of hemochromatosis. No mass identified. Normal appearance of the gallbladder. No biliary dilatation identified. Pancreas: There is abnormal dilatation of the pancreatic duct from the body through the tail. Within the head and neck of pancreas there is a and ill defined area of  soft tissue infiltration which is difficult to assess due to lack of IV contrast material. This measures  5.6 cm transverse 2.9 cm AP and 5.9 cm in length. Spleen: The spleen appears enlarged measuring 16 cm in length. No focal splenic abnormality identified. Adrenals/Urinary Tract: The adrenal glands are normal. Changes of bilateral polycystic kidneys identified. Stomach/Bowel: The stomach appears normal. No pathologic dilatation of the upper abdominal bowel loops. Vascular/Lymphatic: Aortic atherosclerosis noted. No aneurysm. The portal vein is not visualized and is presumably occluded by mass. They SMA and celiac arteries have a common origin and both appear partially encased by this mass.No retroperitoneal adenopathy identified peripancreatic lymph node is enlarged measuring 1.8 cm, image 47 of series 11. Other: No significant free fluid or abnormal fluid collections identified. Musculoskeletal: There is diffuse abnormal signal throughout the bone marrow suggestive of iron overload. IMPRESSION: 1. Poorly defined mass involving the head of pancreas and obstructing the pancreatic duct is again noted. Suspicious for pancreatic adenocarcinoma. This is difficult to further characterize above what was seen on CT from 11/22/2015. This is largely a reflection of lack of IV contrast material (due to chronic kidney disease) and motion artifact. There appears to be involvement of the celiac and SMA arteries and occlusion of the portal vein. Assuming no no allergy to IV contrast material then further assessment of this lesion with pancreas protocol CT of the abdomen would be advised followed by dialysis. 2. Abnormal signal within the bone marrow, liver and spleen is suggestive of hemochromatosis. 3. Aortic atherosclerosis 4. Bilateral end-stage polycystic kidneys. Electronically Signed   By: Kerby Moors M.D.   On: 11/25/2015 11:29   Dg Chest Port 1 View  11/22/2015  CLINICAL DATA:  Patient comes from [facility] for  post CVA post MI. Patient started having Diarrhea since Friday. Patient started having Abd pain and fever 103.1 . Hx of diabetes, stroke, HTN. EXAM: PORTABLE CHEST 1 VIEW COMPARISON:  05/03/2014 FINDINGS: Nasogastric tube has been removed. Heart is mildly enlarged. Film is made with shallow lung inflation. There is linear density and adjacent lucency at the right lung base, likely related to linear atelectasis. However it is difficult to exclude free intraperitoneal air. There is focal atelectasis or early infiltrate at the left lung base. Mild pulmonary edema is noted. IMPRESSION: 1. Question of free intraperitoneal air beneath the right hemidiaphragm. Further evaluation with left lateral decubitus view of the abdomen or CT of the abdomen and pelvis is recommended. 2. Bibasilar atelectasis or infiltrates, left greater than right. 3. Mild interstitial pulmonary edema. Electronically Signed   By: Nolon Nations M.D.   On: 11/22/2015 17:40   Dg Abd 2 Views  11/08/2015  CLINICAL DATA:  Small-bowel obstruction EXAM: ABDOMEN - 2 VIEW COMPARISON:  CT abdomen 11/07/2015 FINDINGS: Mildly dilated small bowel loops with air-fluid levels. Gas in the transverse colon with air-fluid levels. Upright view does not include the right hemidiaphragm. This would need to be repeated if there is concern of free air. IMPRESSION: Air-fluid levels in large and small bowel may be related to ileus or resolving small bowel obstruction Right hemidiaphragm not included on the upright view. If there is concern of free air, repeat upright view could be performed. Electronically Signed   By: Franchot Gallo M.D.   On: 11/08/2015 15:01   Dg Abd Portable 2v  11/22/2015  CLINICAL DATA:  Diarrhea since Friday with history previous small-bowel obstruction. EXAM: PORTABLE ABDOMEN - 2 VIEW COMPARISON:  X-ray 11/08/2015.  CT scan 10/30/2015. FINDINGS: Right-side-up decubitus film shows no evidence for intraperitoneal free air. Diffuse gaseous small  bowel distention is  noted. There is some high attenuation which appears to be in small bowel loops over the central abdomen, nonspecific. Given the central clustering of small bowel , ventral hernia would be a consideration. Bones are diffusely demineralized. , IMPRESSION: Diffuse gaseous bowel distention without evidence for intraperitoneal free air. Ileus or small-bowel obstruction could have this appearance. Central clustering of small bowel raises the question for bowel herniation into a ventral hernia which was seen on the previous CT scan. Electronically Signed   By: Misty Stanley M.D.   On: 11/22/2015 19:03   Mr Lambert Mody Cm/mrcp  11/28/2015  CLINICAL DATA:  Patient with pancreatic head mass suspicious for adenocarcinoma. EXAM: MRI ABDOMEN WITHOUT CONTRAST  (INCLUDING MRCP) TECHNIQUE: Multiplanar multisequence MR imaging of the abdomen was performed. Heavily T2-weighted images of the biliary and pancreatic ducts were obtained, and three-dimensional MRCP images were rendered by post processing. COMPARISON:  MRI 11/25/2015; CT abdomen pelvis 11/22/2015. FINDINGS: Examination limited secondary to motion artifact and lack of intravenous contrast material. Lower chest:  No large pleural effusion. Hepatobiliary: Diffuse abnormal signal of the liver suggestive of hemochromatosis. Gallbladder is distended. Layering stones/sludge within the gallbladder lumen. No definite intrahepatic biliary ductal dilatation identified. Pancreas: Re- demonstrated dilatation of the main pancreatic duct throughout the body and tail measuring up to 11 mm. Markedly limited evaluation of the pancreas due to extensive motion artifact and lack of IV contrast. Suggestion of abnormal irregular soft tissue within the region of the pancreatic head. This is difficult to measure given the above described limitations. Spleen: Enlarged measuring approximately 16 cm. Adrenals/Urinary Tract: Normal adrenal glands. Bilateral polycystic kidneys.  Stomach/Bowel: No evidence for bowel obstruction. Vascular/Lymphatic: Normal caliber abdominal aorta. No definite retroperitoneal adenopathy. The portal vein is not visualized and appears to be occluded from the suspected pancreatic head mass. Re- demonstrated enlarged 1.6 cm peripancreatic lymph node. Other: No significant free fluid in the abdomen. Musculoskeletal: Abnormal signal throughout the bone marrow suggestive of iron overload. IMPRESSION: Markedly limited examination secondary to extensive motion artifact and lack of intravenous contrast material. There is suggestion of a poorly defined mass involving the head of the pancreas with obstruction of the pancreatic duct suspicious for pancreatic adenocarcinoma. Consider dedicated evaluation with pancreatic protocol CT if the patient is on hemodialysis and able to receive intravenous contrast material. Abnormal signal in the bone marrow, liver and spleen suggestive of hemochromatosis. Polycystic kidney disease. Electronically Signed   By: Lovey Newcomer M.D.   On: 11/28/2015 07:55   US Abdomen Limited Ruq  11/07/2015  CLINICAL DATA:  Chronic periumbilical pain for 1 month. Initial encounter. EXAM: US ABDOMEN LIMITED - RIGHT UPPER QUADRANT COMPARISON:  CT of the abdomen and pelvis from 05/03/2014 FINDINGS: Gallbladder: Stones and sludge are noted dependently within the gallbladder. The gallbladder is otherwise unremarkable. No significant gallbladder wall thickening or pericholecystic fluid is seen. No ultrasonographic Murphy's sign is elicited. Common bile duct: Diameter: 0.6 cm, within normal limits in caliber. Liver: No focal lesion identified. Within normal limits in parenchymal echogenicity. IMPRESSION: 1. No acute abnormality seen at the right upper quadrant. 2. Stones and sludge seen dependently within the gallbladder. Gallbladder otherwise unremarkable in appearance. Electronically Signed   By: Garald Balding M.D.   On: 11/07/2015 01:20    Time Spent  in minutes  25   Franceen Erisman K M.D on 11/28/2015 at 9:01 AM  Between 7am to 7pm - Pager - [phone]  After 7pm go to [email or web address] - password [email or web address]  [facility] -  Office  [phone]

## 2015-11-29 ENCOUNTER — Encounter (HOSPITAL_COMMUNITY): Payer: Self-pay | Admitting: Gastroenterology

## 2015-11-29 DIAGNOSIS — Z515 Encounter for palliative care: Secondary | ICD-10-CM | POA: Insufficient documentation

## 2015-11-29 DIAGNOSIS — A047 Enterocolitis due to Clostridium difficile: Secondary | ICD-10-CM

## 2015-11-29 DIAGNOSIS — K209 Esophagitis, unspecified without bleeding: Secondary | ICD-10-CM | POA: Insufficient documentation

## 2015-11-29 DIAGNOSIS — F411 Generalized anxiety disorder: Secondary | ICD-10-CM

## 2015-11-29 DIAGNOSIS — K269 Duodenal ulcer, unspecified as acute or chronic, without hemorrhage or perforation: Secondary | ICD-10-CM

## 2015-11-29 DIAGNOSIS — Z7189 Other specified counseling: Secondary | ICD-10-CM | POA: Insufficient documentation

## 2015-11-29 DIAGNOSIS — K869 Disease of pancreas, unspecified: Secondary | ICD-10-CM

## 2015-11-29 LAB — COMPREHENSIVE METABOLIC PANEL
ALT: 16 U/L — ABNORMAL LOW (ref 17–63)
AST: 41 U/L (ref 15–41)
Albumin: 2.4 g/dL — ABNORMAL LOW (ref 3.5–5.0)
Alkaline Phosphatase: 103 U/L (ref 38–126)
Anion gap: 11 (ref 5–15)
BUN: 62 mg/dL — ABNORMAL HIGH (ref 6–20)
CHLORIDE: 108 mmol/L (ref 101–111)
CO2: 20 mmol/L — ABNORMAL LOW (ref 22–32)
CREATININE: 9.01 mg/dL — AB (ref 0.61–1.24)
Calcium: 7.8 mg/dL — ABNORMAL LOW (ref 8.9–10.3)
GFR, EST AFRICAN AMERICAN: 6 mL/min — AB (ref >60)
GFR, EST NON AFRICAN AMERICAN: 5 mL/min — AB (ref >60)
Glucose, Bld: 174 mg/dL — ABNORMAL HIGH (ref 65–99)
POTASSIUM: 3.1 mmol/L — AB (ref 3.5–5.1)
Sodium: 139 mmol/L (ref 135–145)
TOTAL PROTEIN: 5.9 g/dL — AB (ref 6.5–8.1)
Total Bilirubin: 0.8 mg/dL (ref 0.3–1.2)

## 2015-11-29 LAB — CBC
HCT: 25.6 % — ABNORMAL LOW (ref 39.0–52.0)
Hemoglobin: 8 g/dL — ABNORMAL LOW (ref 13.0–17.0)
MCH: 25.6 pg — AB (ref 26.0–34.0)
MCHC: 31.3 g/dL (ref 30.0–36.0)
MCV: 82.1 fL (ref 78.0–100.0)
PLATELETS: 163 10*3/uL (ref 150–400)
RBC: 3.12 MIL/uL — ABNORMAL LOW (ref 4.22–5.81)
RDW: 17.7 % — AB (ref 11.5–15.5)
WBC: 7 10*3/uL (ref 4.0–10.5)

## 2015-11-29 LAB — HEMOGLOBIN
HEMOGLOBIN: 8.1 g/dL — AB (ref 13.0–17.0)
Hemoglobin: 7.9 g/dL — ABNORMAL LOW (ref 13.0–17.0)
Hemoglobin: 8.7 g/dL — ABNORMAL LOW (ref 13.0–17.0)

## 2015-11-29 LAB — GLUCOSE, CAPILLARY
GLUCOSE-CAPILLARY: 126 mg/dL — AB (ref 65–99)
GLUCOSE-CAPILLARY: 110 mg/dL — AB (ref 65–99)
Glucose-Capillary: 126 mg/dL — ABNORMAL HIGH (ref 65–99)
Glucose-Capillary: 143 mg/dL — ABNORMAL HIGH (ref 65–99)

## 2015-11-29 MED ORDER — HYDROCODONE-ACETAMINOPHEN 5-325 MG PO TABS
ORAL_TABLET | ORAL | Status: AC
  Administered 2015-11-29: 1
  Filled 2015-11-29: qty 1

## 2015-11-29 MED ORDER — SODIUM CHLORIDE 0.9 % IV SOLN: 100.0000 mL | INTRAVENOUS | Status: DC | PRN

## 2015-11-29 MED ORDER — LIDOCAINE-PRILOCAINE 2.5-2.5 % EX CREA: 1.0000 "application " | TOPICAL_CREAM | CUTANEOUS | Status: DC | PRN

## 2015-11-29 MED ORDER — MIDODRINE HCL 5 MG PO TABS
ORAL_TABLET | ORAL | Status: AC
  Administered 2015-11-29: 5 mg via ORAL
  Filled 2015-11-29: qty 1

## 2015-11-29 MED ORDER — LIDOCAINE HCL (PF) 1 % IJ SOLN: 5.0000 mL | INTRAMUSCULAR | Status: DC | PRN

## 2015-11-29 MED ORDER — DARBEPOETIN ALFA 150 MCG/0.3ML IJ SOSY: 150.0000 ug | PREFILLED_SYRINGE | INTRAMUSCULAR | Status: DC

## 2015-11-29 MED ORDER — DARBEPOETIN ALFA 150 MCG/0.3ML IJ SOSY
PREFILLED_SYRINGE | INTRAMUSCULAR | Status: AC
  Administered 2015-11-29: 150 ug
  Filled 2015-11-29: qty 0.3

## 2015-11-29 MED ORDER — PANTOPRAZOLE SODIUM 40 MG PO TBEC
40.0000 mg | DELAYED_RELEASE_TABLET | Freq: Every day | ORAL | Status: DC
  Administered 2015-11-29 – 2015-11-30 (×2): 40 mg via ORAL
  Filled 2015-11-29 (×2): qty 1

## 2015-11-29 MED ORDER — PENTAFLUOROPROP-TETRAFLUOROETH EX AERO: 1.0000 "application " | INHALATION_SPRAY | CUTANEOUS | Status: DC | PRN

## 2015-11-29 MED ORDER — POTASSIUM CHLORIDE CRYS ER 20 MEQ PO TBCR
40.0000 meq | EXTENDED_RELEASE_TABLET | Freq: Once | ORAL | Status: AC
  Administered 2015-11-29: 40 meq via ORAL
  Filled 2015-11-29: qty 2

## 2015-11-29 MED ORDER — HEPARIN SODIUM (PORCINE) 1000 UNIT/ML DIALYSIS: 1000.0000 [IU] | INTRAMUSCULAR | Status: DC | PRN

## 2015-11-29 MED ORDER — ALTEPLASE 2 MG IJ SOLR: 2.0000 mg | Freq: Once | INTRAMUSCULAR | Status: DC | PRN

## 2015-11-29 NOTE — Progress Notes (Signed)
MD notified of pts Hgb 7.9

## 2015-11-29 NOTE — Procedures (Signed)
I have personally attended this patient's dialysis session.   4K bath (K 3.1 and supplemented as well) LAVG 400 No heparin Keeping even BP stable  Jamal Maes, MD [place] Pager 11/29/2015, 1:12 PM

## 2015-11-29 NOTE — Clinical Social Work Note (Signed)
Patient transferred to 6N16 from 2H27, [place] contacted unit CSW regarding patient's transfer and gave her verbal handoff.  This CSW to sign off.  Jones Broom. Burton, MSW, [place] 11/29/2015 4:13 PM

## 2015-11-29 NOTE — Consult Note (Addendum)
Consultation Note Date: 11/29/2015   Patient Name: Sean KIEBLER  DOB: Jan 01, 1936  MRN: [id number]  Age / Sex: 80 y.o., male  PCP: [name], MD Referring Physician: Thurnell Lose, MD  Reason for Consultation: Establishing goals of care  HPI/Patient Profile: 80 y.o. male      admitted on 11/22/2015    Clinical Assessment and Goals of Care: 80 year old gentleman who lives at [facility], past medical history significant for ischemic colitis status post bowel resection, history of recent hospitalization for gallstone pancreatitis. Status post left above-the-knee amputation. History of end-stage renal disease hemodialysis dependent on Monday Wednesday and Friday. History of anxiety depression diet-controlled diabetes mellitus. Patient has been admitted to the hospitalist service for sepsis possible Clostridium difficile infection. Was undergoing hemodialysis and was noted to have developed acute upper GI bleed due to duodenal bulb cratered ulcer this was detected on esophageal gastroduodenoscopy. Patient also been diagnosed with a pancreatic mass based on MRI of the abdomen in this hospitalization. CA 19-9 ordered, pending. Palliative care consultation for goals of care discussions.  Patient is an elderly pleasant gentleman sitting up in a chair. He did participate some with physical therapy and did well. He is being given grits-softer diet he is tolerating it well. At times, he complains of pain in his sides. He denies any nausea vomiting. He has not had any further GI bleeding. He still has some mild or diarrhea. He is resting comfortably.  I introduced palliative care as follows: Palliative medicine is specialized medical care for people living with serious illness. It focuses on providing relief from the symptoms and stress of a serious illness. The goal is to improve quality of life for both the patient  and the family.  MRI of the abdomen results discussed with the patient concern for this being pancreatic malalignment C discussed openly and extensively. Discussed about patient's other multiple comorbidities contradicting to his overall state of decline in ill health. This includes his hemodialysis dependency. CODE STATUS discussions undertaken in great detail. Advance care planning and designation of healthcare power of attorney discussions also taken in great detail. See recommendations below. He elects continuation of full code, continuation of hemodialysis for now. Wishes to go back to [facility]. Recommend palliative follow him at [facility] place. Likely outpatient workup of pancreatic mass.  HCPOA  elects his son Lynnell Chad   SUMMARY OF RECOMMENDATIONS    desires full code, how ever, does not want to be on prolonged mechanical support if not improving.  Elects son Aadesh Granados to be his hcpoa agent, chaplain to complete paperwork Came from [facility], wants to go back there, recommend palliative follow up at [facility] after d/c Call placed and discussed in detail with son Lynnell Chad, he agrees with the above.   Code Status/Advance Care Planning:  Full code    Symptom Management:    continue current treatment.   Palliative Prophylaxis:   Bowel Regimen   Psycho-social/Spiritual:   Desire for further Chaplaincy support:yes  Additional Recommendations: Caregiving  Support/Resources  Prognosis:   Unable to determine but appears guarded given recent diagnosis of possible pancreatic adenocarcinoma.   Discharge Planning: [facility] for rehab with Palliative care service follow-up, patient came from [facility], has been living there for the past several years.       Primary Diagnoses: Present on Admission:  . Anxiety state . CAD (coronary artery disease), native coronary artery . Secondary hyperparathyroidism ([place]) . Depression . Cholelithiasis . Ischemic  colitis ([place]) . PAD (peripheral artery disease) ([place]) . Chronic systolic CHF (congestive heart failure) ([place]) . C. difficile colitis . Sepsis ([place])  I have reviewed the medical record, interviewed the patient and family, and examined the patient. The following aspects are pertinent.  Past Medical History  Diagnosis Date  . Hyperparathyroidism   . Arthritis   . Hyperlipidemia   . Hypertension   . Stroke ([place])   . GERD (gastroesophageal reflux disease)   . Migraine   . Heart attack ([place]) 2011; 1990's  . Peripheral neuropathy ([place])   . Depression with anxiety   . Dialysis patient [facility])     M, W, F; [place] CAD (coronary artery disease)     a. s/p NSTEMI and failed RCA PCI in 2011. b. Nuc 12/2013: nonischemic, prior inferior MI.  Marland Kitchen LBBB (left bundle branch block)   . Calciphylaxis     a. Multiple admissions for nonhealing L ulcer/sepsis, s/p L AKA 02/02/2014.  Marland Kitchen Chronic anemia   . Dementia   . ESRD (end stage renal disease) on dialysis ([place])   . Renal insufficiency   . Hypotension     renovascular  . History of left bundle branch block   . Type II diabetes mellitus ([place])     not taking any medications.  . Cholelithiasis 01/2014    acute cholecystitis managed with perc drain due to high surgical risk.    Social History   Social History  . Marital Status: Married    Spouse Name: N/A  . Number of Children: N/A  . Years of Education: N/A   Social History Main Topics  . Smoking status: Former Smoker -- 0.25 packs/day for 7 years    Types: Cigarettes    Quit date: 06/12/1976  . Smokeless tobacco: Never Used  . Alcohol Use: No  . Drug Use: No  . Sexual Activity: No   Other Topics Concern  . None   Social History Narrative   Family History  Problem Relation Age of Onset  . Cancer Sister     breast  . Cancer Brother     lung  . Anesthesia problems Neg Hx   . Hypotension Neg Hx   . Malignant hyperthermia Neg Hx   . Pseudochol deficiency Neg Hx     Scheduled Meds: . sodium chloride   Intravenous Once  . atorvastatin  20 mg Oral QHS  . B-complex with vitamin C  1 tablet Oral QHS  . darbepoetin (ARANESP) injection - DIALYSIS  150 mcg Intravenous Q Mon-HD  . feeding supplement (PRO-STAT SUGAR FREE 64)  30 mL Oral Daily  . gabapentin  100 mg Oral QHS  . insulin aspart  0-5 Units Subcutaneous QHS  . insulin aspart  0-9 Units Subcutaneous TID WC  . midodrine  5 mg Oral 3 times per day on Sun Tue Thu Sat   And  . midodrine  5 mg Oral 2 times per day on Mon Wed Fri  . pantoprazole  40 mg Oral Q0600  .  sodium chloride flush  3 mL Intravenous Q12H  . vancomycin  500 mg Oral Q6H   Continuous Infusions: . sodium chloride 10 mL/hr at 11/29/15 0800   PRN Meds:.acetaminophen, alum & mag hydroxide-simeth, Gerhardt's butt cream, HYDROcodone-acetaminophen, hydrOXYzine, morphine injection Medications Prior to Admission:  Prior to Admission medications   Medication Sig Start Date End Date Taking? Authorizing Provider  acetaminophen (TYLENOL) 325 MG tablet Take 650 mg by mouth every 4 (four) hours as needed for mild pain, fever or headache (DO NOT EXCEED 4 GM OF APAP IN 24 HOURS FORM ALL SOURCES).    Yes Historical Provider, MD  Amino Acids-Protein Hydrolys (FEEDING SUPPLEMENT, PRO-STAT SUGAR FREE 64,) LIQD Take 30 mLs by mouth daily.   Yes Historical Provider, MD  aspirin 81 MG chewable tablet Chew 81 mg by mouth daily.    Yes Historical Provider, MD  atorvastatin (LIPITOR) 20 MG tablet Take 20 mg by mouth at bedtime.    Yes Historical Provider, MD  b-complex w/C (TOTAL B/C) TABS tablet Take 1 tablet by mouth at bedtime.   Yes Historical Provider, MD  docusate sodium (COLACE) 100 MG capsule Take 100 mg by mouth daily.   Yes Historical Provider, MD  gabapentin (NEURONTIN) 100 MG capsule Take 100 mg by mouth at bedtime.    Yes Historical Provider, MD  loperamide (IMODIUM) 2 MG capsule Take 2-4 mg by mouth See admin instructions. 4 mg (as needed)  initially followed by 2 mg after each loose stool   Yes Historical Provider, MD  midodrine (PROAMATINE) 5 MG tablet Take 5 mg by mouth 3 (three) times daily. Do not take 6 am dose on M- W- F- d/t  dialysis days. 02/01/15  Yes Historical Provider, MD  ondansetron (ZOFRAN) 4 MG tablet Take 4 mg by mouth every 6 (six) hours as needed for nausea.   Yes Historical Provider, MD  sevelamer carbonate (RENVELA) 800 MG tablet Take 800 mg by mouth daily with lunch. 12 pm and 5 pm.   Yes Historical Provider, MD  sorbitol 70 % solution Take 30 mLs by mouth daily as needed (severe constipation).   Yes Historical Provider, MD  HYDROcodone-acetaminophen (NORCO) 10-325 MG tablet Take one tablet by mouth every 8 hours as needed for moderate pain. Do not exceed 4gm of Tylenol in 24 hours 11/26/15   Nishant Dhungel, MD  metroNIDAZOLE (FLAGYL) 500 MG tablet Take 1 tablet (500 mg total) by mouth 3 (three) times daily. 11/26/15 12/06/15  Nishant Dhungel, MD  vancomycin (VANCOCIN) 50 mg/mL oral solution Take 10 mLs (500 mg total) by mouth every 6 (six) hours. 11/26/15 12/06/15  Flonnie Overman Dhungel, MD   Allergies  Allergen Reactions  . Benazepril Hcl Hives, Itching and Rash    flareups on patient head   Review of Systems + for pain in "sides" at times.    Physical Exam Elderly gentleman resting in chair S1 S2 Diminished S/p amputation Some edema skin bruises on LLE Awake alert non focal  Vital Signs: BP 111/61 mmHg  Pulse 82  Temp(Src) 97.5 F (36.4 C) (Oral)  Resp 30  Ht 5\' 5"  (1.651 m)  Wt 72 kg (158 lb 11.7 oz)  BMI 26.41 kg/m2  SpO2 100% Pain Assessment: 0-10   Pain Score: 0-No pain   SpO2: SpO2: 100 % O2 Device:SpO2: 100 % O2 Flow Rate: .O2 Flow Rate (L/min): 1 L/min  IO: Intake/output summary:  Intake/Output Summary (Last 24 hours) at 11/29/15 1344 Last data filed at 11/29/15 1038  Gross per 24  hour  Intake 2794.17 ml  Output      2 ml  Net 2792.17 ml    LBM: Last BM Date: 11/29/15 Baseline  Weight: Weight: 70.444 kg (155 lb 4.8 oz) Most recent weight: Weight: 72 kg (158 lb 11.7 oz)     Palliative Assessment/Data:   Flowsheet Rows        Most Recent Value   Intake Tab    Referral Department  Hospitalist   Unit at Time of Referral  Intermediate Care Unit   Palliative Care Primary Diagnosis  Cancer   Palliative Care Type  New Palliative care   Reason for referral  Clarify Goals of Care   Date first seen by Palliative Care  11/29/15   Clinical Assessment    Palliative Performance Scale Score  30%   Pain Max last 24 hours  5   Pain Min Last 24 hours  4   Dyspnea Max Last 24 Hours  4   Dyspnea Min Last 24 hours  3   Psychosocial & Spiritual Assessment    Palliative Care Outcomes    Patient/Family meeting held?  Yes   Who was at the meeting?  patient, son over the phone      Time In: 9am Time Out: 10 Time Total: 60 min  Greater than 50%  of this time was spent counseling and coordinating care related to the above assessment and plan.  Signed by: Loistine Chance, MD  [id number] Please contact Palliative Medicine Team phone at [phone] for questions and concerns.  For individual provider: See Shea Evans

## 2015-11-29 NOTE — Progress Notes (Signed)
[facility] Progress Note   Subjective:   Seen in HD Still having some abd pain left upper abd rad to back Stool frequency decreasing Had EGD yesterday - grade 1 esophagitis and non bleeding DU "ate a little something today"  Objective Filed Vitals:   11/29/15 1059 11/29/15 1130 11/29/15 1200 11/29/15 1230  BP: 118/62 109/64 107/61 109/65  Pulse: 83 82 86 105  Temp:      TempSrc:      Resp:      Height:      Weight:      SpO2:       Physical Exam Seen in the HD unit Very pleasant soft spoken AAM General:NAD Heart: HR 80's regular, some PVC.s S1S2 No S3 Lungs: no rales Abdomen: soft, nondist. Pain w/palp LUQ Extremities: L AKA min if any edema, trace RLE edema Dialysis Access: left lower AVGG + bruit - currently cannulated  Additional Objective   Recent Labs Lab 11/24/15 1838 11/26/15 1236 11/29/15 0323  NA 133* 135 139  K 3.3* 3.1* 3.1*  CL 101 99* 108  CO2 20* 22 20*  GLUCOSE 135* 197* 174*  BUN 52* 47* 62*  CREATININE 10.41* 7.09* 9.01*  CALCIUM 7.9* 7.9* 7.8*  PHOS  --  1.5*  --      Recent Labs Lab 11/22/15 1955 11/26/15 1236 11/29/15 0323  AST 19  --  41  ALT 10*  --  16*  ALKPHOS 144*  --  103  BILITOT 1.7*  --  0.8  PROT 7.2  --  5.9*  ALBUMIN 3.1* 2.5* 2.4*    Recent Labs Lab 11/22/15 1955  LIPASE 34     Recent Labs Lab 11/22/15 1955 11/23/15 0220 11/24/15 1838 11/26/15 1236  11/28/15 1522 11/29/15 0323 11/29/15 0739  WBC 9.5 8.2 12.0* 9.4  --   --  7.0  --   NEUTROABS 7.5  --   --   --   --   --   --   --   HGB 10.9* 9.2* 9.9* 8.7*  < > 7.2* 8.0*  8.1* 7.9*  HCT 34.9* 29.3* 31.5* 27.5*  --   --  25.6*  --   MCV 83.5 82.5 80.6 82.1  --   --  82.1  --   PLT 119* 108* 192 205  --   --  163  --   < > = values in this interval not displayed.  Blood Culture    Component Value Date/Time   SDES BLOOD RIGHT HAND 11/22/2015 1955   SPECREQUEST BOTTLES DRAWN AEROBIC AND ANAEROBIC 5CC 11/22/2015 1955   CULT NO  GROWTH 5 DAYS 11/22/2015 1955   REPTSTATUS 11/27/2015 FINAL 11/22/2015 1955     Recent Labs Lab 11/27/15 2114 11/28/15 0757 11/28/15 1631 11/28/15 2137 11/29/15 0748  GLUCAP 101* 93 87 161* 126*    Studies/Results: Mr Abd W Cm/mrcp  11/28/2015  CLINICAL DATA:  Patient with pancreatic head mass suspicious for adenocarcinoma. EXAM: MRI ABDOMEN WITHOUT CONTRAST  (INCLUDING MRCP) TECHNIQUE: Multiplanar multisequence MR imaging of the abdomen was performed. Heavily T2-weighted images of the biliary and pancreatic ducts were obtained, and three-dimensional MRCP images were rendered by post processing. COMPARISON:  MRI 11/25/2015; CT abdomen pelvis 11/22/2015. FINDINGS: Examination limited secondary to motion artifact and lack of intravenous contrast material. Lower chest:  No large pleural effusion. Hepatobiliary: Diffuse abnormal signal of the liver suggestive of hemochromatosis. Gallbladder is distended. Layering stones/sludge within the gallbladder lumen. No definite intrahepatic biliary ductal  dilatation identified. Pancreas: Re- demonstrated dilatation of the main pancreatic duct throughout the body and tail measuring up to 11 mm. Markedly limited evaluation of the pancreas due to extensive motion artifact and lack of IV contrast. Suggestion of abnormal irregular soft tissue within the region of the pancreatic head. This is difficult to measure given the above described limitations. Spleen: Enlarged measuring approximately 16 cm. Adrenals/Urinary Tract: Normal adrenal glands. Bilateral polycystic kidneys. Stomach/Bowel: No evidence for bowel obstruction. Vascular/Lymphatic: Normal caliber abdominal aorta. No definite retroperitoneal adenopathy. The portal vein is not visualized and appears to be occluded from the suspected pancreatic head mass. Re- demonstrated enlarged 1.6 cm peripancreatic lymph node. Other: No significant free fluid in the abdomen. Musculoskeletal: Abnormal signal throughout the  bone marrow suggestive of iron overload. IMPRESSION: Markedly limited examination secondary to extensive motion artifact and lack of intravenous contrast material. There is suggestion of a poorly defined mass involving the head of the pancreas with obstruction of the pancreatic duct suspicious for pancreatic adenocarcinoma. Consider dedicated evaluation with pancreatic protocol CT if the patient is on hemodialysis and able to receive intravenous contrast material. Abnormal signal in the bone marrow, liver and spleen suggestive of hemochromatosis. Polycystic kidney disease. Electronically Signed   By: Lovey Newcomer M.D.   On: 11/28/2015 07:55   Medications: . sodium chloride 10 mL/hr at 11/29/15 0800   . sodium chloride   Intravenous Once  . atorvastatin  20 mg Oral QHS  . B-complex with vitamin C  1 tablet Oral QHS  . feeding supplement (PRO-STAT SUGAR FREE 64)  30 mL Oral Daily  . gabapentin  100 mg Oral QHS  . insulin aspart  0-5 Units Subcutaneous QHS  . insulin aspart  0-9 Units Subcutaneous TID WC  . midodrine  5 mg Oral 3 times per day on Sun Tue Thu Sat   And  . midodrine  5 mg Oral 2 times per day on Mon Wed Fri  . pantoprazole  40 mg Oral Q0600  . sodium chloride flush  3 mL Intravenous Q12H  . vancomycin  500 mg Oral Q6H   Dialysis: [facility]  4h  400/500  71kg  2/2.25 bath  P4  LFA AVG  Hep 5000 Mircera 75 mcg IV q 4 weeks (last dose 11/12/15 HGB 10.3 11/17/15) Hectorol 3 mcg IV q tx (last PTH 1794 10/07/15)   Assessment: 1. Hematemesis/ melena - Grade 1 esophagitis by EGD and non-bleeding DU. GI has changed to po protonix. Hb stable low. 2. Pancreatic mass - per MRI "poorly defined mass involving the head of pancreas and obstructing the pancreatic duct.... suspicious for pancreatic adenocarcinoma....there appears to be involvement of the celiac and SMA arteries and occlusion of the portal vein", ^^Ca 19-9; eval in progress 3. C Diff/ diarrhea: on IV flagyl/po vanc, stools slowing  down 4. ESRD MWF HD today. 4K bath. 5. Hypokalemia - had 40 KLorCon this AM. Recheck tomorrow 6. Anemia - on Aranesp as outpt. Last dosed 6/2. Hb around 8. Give aranesp while here - start today. 150 mcg. 7. Hypotension on midodrine pre HD 8. MBD- phos 1.5, stopped binders,  hectorol just restarted , previous calciphylaxis but wounds healed and iPTH had been trending up. Recheck phos in AM 9. DM: Per primary  Jamal Maes, MD [facility] [phone] Pager 11/29/2015, 12:55 PM

## 2015-11-29 NOTE — Progress Notes (Signed)
Md paged about  pts potassium of 3.1 this am.  Will continue to monitor. Saunders Revel T

## 2015-11-29 NOTE — Progress Notes (Signed)
PROGRESS NOTE                                                                                                                                                                                                             Patient Demographics:    Sean [name], is a 80 y.o. male, DOB - 1935/09/13, [id number]  Admit date - 11/22/2015   Admitting Physician Ivor Costa, MD  Outpatient Primary MD for the patient is Sean Serve, MD  LOS - 7  Outpatient Specialists: Nephrology  Chief Complaint  Patient presents with  . Diarrhea       Brief Narrative    80 year old male with history of ischemic colitis status post bowel resection, cholelithiasis, recent hospitalization for gallstone pancreatitis, ESRD on dialysis (M, W, F), status post left AKA, hypotension, anemia, anxiety and depression, diet-controlled diabetes mellitus presented with abdominal pain as stated with fever and diarrhea for the past 3 days. Reported 6-7 bowel movements with loose stools with periumbilical pain, [id number] in severe at the nonradiating. Reported nausea but no vomiting. He reported having fever of 103F at home.  In the ED stool first C. difficile was positive, had mildly elevated lactic acid and had low-grade temperature. Creatinine was 8.69. CT abdomen pelvis done in the ED showed mild infectious versus inflammatory colitis of the descending, sigmoid colon and rectum. Also showed suspicious infiltrating pancreatic neoplasm.  Patient refused further CT imaging with pancreatic protocol and plan for biopsy as outpatient. He was planned to be discharged back to skilled nursing facility on 6/16 after dialysis. However during dialysis he had 2 episodes of coffee-ground emesis and further dialysis was aborted. Repeat CBC showed some drop in H&H. GI consulted who evaluated the patient and offered EGD but he refused. Patient was placed on Protonix drip , Made  nothing by mouth and transfer to stepdown unit.  He was transferred to my care on 11/28/2015 on day 6 of his hospital stay.   Subjective:   Patient in bed, denies any headache, no chest or abdominal pain. No focal weakness.   Assessment  & Plan :   Sepsis with C. difficile colitis - Sepsis resolved. On oral vancomycin and Flagyl. Diarrhea resolved... Plan on 2 weeks of antibiotics total (stop date 12/06/2015)  Acute upper GI bleed Due to duodenal bulb cratered ulcer found on EGD -  Suspect this could be variceal bleed based on occlusion of portal vein seen on MRI. Received 1 unit of packed RBC on 11/28/2015, currently H&H stable, Continue PPI drip. Underwent EGD on 11/28/2015 with duodenal bulb ulcer which was cratered, continue to monitor H&H.  Recent history of gallstone pancreatitis - Was scheduled for cholecystectomy on 6/14. Surgery was consulted who recommended holding off until acute illness had resolved. No clinical signs or symptoms of acute pancreatitis. Should follow-up with surgery in 2 weeks post DC.  ? Pancreatic adenocarcinoma -  MRI without contrast done shows poorly defined mass over head of pancreas and obstructive pancreatic duct suspicious for adenocarcinoma. Possible involvement of SMA , celiac arteries and occlusion of portal vein. Recommend CT abd with contrast with pancreatic protocol. CA 19-9 markedly elevated to 2400. Patient refused CT with pancreatic protocol. Discussed with family may get him to do the workup outpatient. Son also leading to its [facility]/hospice, we will consult palliative care.  End stage renal disease on dialysis [facility]) renal on board getting dialysis per schedule, he is on Monday, Wednesday and Friday schedule.  CAD Hold aspirin due to GI bleed. Continue Lipitor   Chronic systolic CHF (congestive heart failure) ([place]): Euvolemic. Volume management with dialysis.  Diet-controlled diabetes mellitus Monitor on SSI.  Lab Results  Component Value  Date   HGBA1C 6.6 07/07/2015   CBG (last 3)   Recent Labs  11/28/15 1631 11/28/15 2137 11/29/15 0748  GLUCAP 87 161* 126*    Code Status : Full code  Family Communication  : Son and daughter on the phone on 11/28/2015, Son expressed the desire to transition the patient on hospice/comfort care to GI physician on 11/28/2015.  Disposition Plan  : To skilled nursing facility once acute issues resolve  Barriers For Discharge : GI bleed  Consults  :  Renal, GI, palliative care   Procedures  :   CT abdomen without contrast MRI abdomen without contrast EGD - GERD ulcer and duodenal bulb, esophagitis  DVT Prophylaxis  :  SCDs  Lab Results  Component Value Date   PLT 163 11/29/2015    Antibiotics  :    Anti-infectives    Start     Dose/Rate Route Frequency Ordered Stop   11/26/15 0000  vancomycin (VANCOCIN) 50 mg/mL oral solution     500 mg Oral Every 6 hours 11/26/15 1159 12/06/15 2359   11/26/15 0000  metroNIDAZOLE (FLAGYL) 500 MG tablet     500 mg Oral 3 times daily 11/26/15 1159 12/06/15 2359   11/23/15 0830  metroNIDAZOLE (FLAGYL) IVPB 500 mg     500 mg 100 mL/hr over 60 Minutes Intravenous Every 8 hours 11/23/15 0814     11/23/15 0115  vancomycin (VANCOCIN) 50 mg/mL oral solution 500 mg     500 mg Oral Every 6 hours 11/23/15 0113 12/06/15 2359   11/22/15 2359  metroNIDAZOLE (FLAGYL) IVPB 500 mg  Status:  Discontinued     500 mg 100 mL/hr over 60 Minutes Intravenous Every 8 hours 11/22/15 2311 11/23/15 0113   11/22/15 2359  ciprofloxacin (CIPRO) IVPB 400 mg  Status:  Discontinued    Comments:  Cipro 400 mg IV q24h for CrCl < 30 mL/min   400 mg 200 mL/hr over 60 Minutes Intravenous Daily at bedtime 11/22/15 2332 11/23/15 0113   11/22/15 2200  piperacillin-tazobactam (ZOSYN) IVPB 2.25 g  Status:  Discontinued     2.25 g 100 mL/hr over 30 Minutes Intravenous Every 8 hours 11/22/15 1631  11/22/15 2310   11/22/15 1630  piperacillin-tazobactam (ZOSYN) IVPB 3.375 g   Status:  Discontinued     3.375 g 100 mL/hr over 30 Minutes Intravenous  Once 11/22/15 1616 11/22/15 2200        Objective:   Filed Vitals:   11/29/15 0600 11/29/15 0700 11/29/15 0753 11/29/15 0800  BP:   122/59   Pulse: 91 82 84 76  Temp:   98.4 F (36.9 C)   TempSrc:   Oral   Resp: 24 12 15 13   Height:      Weight:      SpO2: 99% 97% 100% 100%    Wt Readings from Last 3 Encounters:  11/29/15 72 kg (158 lb 11.7 oz)  11/12/15 85.73 kg (189 lb)  11/11/15 85.73 kg (189 lb)     Intake/Output Summary (Last 24 hours) at 11/29/15 0924 Last data filed at 11/29/15 0800  Gross per 24 hour  Intake 2824.17 ml  Output      0 ml  Net 2824.17 ml     Physical Exam  Gen: not in distress Awake alert, flat affect Good movement bilaterally, no rhonchi no wheezes Regular rate rhythm normal S1-S2 Abdomen soft positive bowel sounds nontender Old left AKA.   Data Review:    CBC  Recent Labs Lab 11/22/15 1955 11/23/15 0220 11/24/15 1838 11/26/15 1236  11/28/15 0527 11/28/15 [id number] 11/28/15 1522 11/29/15 0323 11/29/15 0739  WBC 9.5 8.2 12.0* 9.4  --   --   --   --  7.0  --   HGB 10.9* 9.2* 9.9* 8.7*  < > 7.4* 7.5* 7.2* 8.0*  8.1* 7.9*  HCT 34.9* 29.3* 31.5* 27.5*  --   --   --   --  25.6*  --   PLT 119* 108* 192 205  --   --   --   --  163  --   MCV 83.5 82.5 80.6 82.1  --   --   --   --  82.1  --   MCH 26.1 25.9* 25.3* 26.0  --   --   --   --  25.6*  --   MCHC 31.2 31.4 31.4 31.6  --   --   --   --  31.3  --   RDW 16.5* 16.4* 16.9* 17.5*  --   --   --   --  17.7*  --   LYMPHSABS 0.6*  --   --   --   --   --   --   --   --   --   MONOABS 1.4*  --   --   --   --   --   --   --   --   --   EOSABS 0.0  --   --   --   --   --   --   --   --   --   BASOSABS 0.0  --   --   --   --   --   --   --   --   --   < > = values in this interval not displayed.  Chemistries   Recent Labs Lab 11/22/15 1955 11/23/15 0220 11/24/15 1838 11/26/15 1236 11/29/15 0323  NA 136 135  133* 135 139  K 3.5 3.4* 3.3* 3.1* 3.1*  CL 99* 102 101 99* 108  CO2 23 20* 20* 22 20*  GLUCOSE 107* 104* 135* 197* 174*  BUN 35*  37* 52* 47* 62*  CREATININE 8.69* 8.52* 10.41* 7.09* 9.01*  CALCIUM 8.0* 7.4* 7.9* 7.9* 7.8*  AST 19  --   --   --  41  ALT 10*  --   --   --  16*  ALKPHOS 144*  --   --   --  103  BILITOT 1.7*  --   --   --  0.8   ------------------------------------------------------------------------------------------------------------------ No results for input(s): CHOL, HDL, LDLCALC, TRIG, CHOLHDL, LDLDIRECT in the last 72 hours.  Lab Results  Component Value Date   HGBA1C 6.6 07/07/2015   ------------------------------------------------------------------------------------------------------------------ No results for input(s): TSH, T4TOTAL, T3FREE, THYROIDAB in the last 72 hours.  Invalid input(s): FREET3 ------------------------------------------------------------------------------------------------------------------ No results for input(s): VITAMINB12, FOLATE, FERRITIN, TIBC, IRON, RETICCTPCT in the last 72 hours.  Coagulation profile No results for input(s): INR, PROTIME in the last 168 hours.  No results for input(s): DDIMER in the last 72 hours.  Cardiac Enzymes No results for input(s): CKMB, TROPONINI, MYOGLOBIN in the last 168 hours.  Invalid input(s): CK ------------------------------------------------------------------------------------------------------------------ No results found for: BNP  Inpatient Medications  Scheduled Meds: . sodium chloride   Intravenous Once  . atorvastatin  20 mg Oral QHS  . B-complex with vitamin C  1 tablet Oral QHS  . feeding supplement (PRO-STAT SUGAR FREE 64)  30 mL Oral Daily  . gabapentin  100 mg Oral QHS  . insulin aspart  0-5 Units Subcutaneous QHS  . insulin aspart  0-9 Units Subcutaneous TID WC  . metronidazole  500 mg Intravenous Q8H  . midodrine  5 mg Oral 3 times per day on Sun Tue Thu Sat   And  .  midodrine  5 mg Oral 2 times per day on Mon Wed Fri  . [START ON 11/30/2015] pantoprazole  40 mg Intravenous Q12H  . sodium chloride flush  3 mL Intravenous Q12H  . vancomycin  500 mg Oral Q6H   Continuous Infusions: . sodium chloride 10 mL/hr at 11/29/15 0800  . pantoprozole (PROTONIX) infusion 8 mg/hr (11/29/15 0800)   PRN Meds:.acetaminophen, alum & mag hydroxide-simeth, Gerhardt's butt cream, HYDROcodone-acetaminophen, hydrOXYzine, morphine injection  Micro Results Recent Results (from the past 240 hour(s))  Blood Culture (routine x 2)     Status: None   Collection Time: 11/22/15  7:50 PM  Result Value Ref Range Status   Specimen Description BLOOD RIGHT ARM  Final   Special Requests BOTTLES DRAWN AEROBIC AND ANAEROBIC 5CC  Final   Culture NO GROWTH 5 DAYS  Final   Report Status 11/27/2015 FINAL  Final  Blood Culture (routine x 2)     Status: None   Collection Time: 11/22/15  7:55 PM  Result Value Ref Range Status   Specimen Description BLOOD RIGHT HAND  Final   Special Requests BOTTLES DRAWN AEROBIC AND ANAEROBIC 5CC  Final   Culture NO GROWTH 5 DAYS  Final   Report Status 11/27/2015 FINAL  Final  C difficile quick scan w PCR reflex     Status: Abnormal   Collection Time: 11/22/15 11:27 PM  Result Value Ref Range Status   C Diff antigen POSITIVE (A) NEGATIVE Final   C Diff toxin POSITIVE (A) NEGATIVE Final   C Diff interpretation Positive for toxigenic C. difficile  Final    Comment: CRITICAL RESULT CALLED TO, READ BACK BY AND VERIFIED WITH: B OLDAND,RN @0100  11/23/15 MKELLY   Gastrointestinal Panel by PCR , Stool     Status: None   Collection Time: 11/22/15 11:27  PM  Result Value Ref Range Status   Campylobacter species NOT DETECTED NOT DETECTED Final   Plesimonas shigelloides NOT DETECTED NOT DETECTED Final   Salmonella species NOT DETECTED NOT DETECTED Final   Yersinia enterocolitica NOT DETECTED NOT DETECTED Final   Vibrio species NOT DETECTED NOT DETECTED Final    Vibrio cholerae NOT DETECTED NOT DETECTED Final   Enteroaggregative E coli (EAEC) NOT DETECTED NOT DETECTED Final   Enteropathogenic E coli (EPEC) NOT DETECTED NOT DETECTED Final   Enterotoxigenic E coli (ETEC) NOT DETECTED NOT DETECTED Final   Shiga like toxin producing E coli (STEC) NOT DETECTED NOT DETECTED Final   E. coli O157 NOT DETECTED NOT DETECTED Final   Shigella/Enteroinvasive E coli (EIEC) NOT DETECTED NOT DETECTED Final   Cryptosporidium NOT DETECTED NOT DETECTED Final   Cyclospora cayetanensis NOT DETECTED NOT DETECTED Final   Entamoeba histolytica NOT DETECTED NOT DETECTED Final   Giardia lamblia NOT DETECTED NOT DETECTED Final   Adenovirus F40/41 NOT DETECTED NOT DETECTED Final   Astrovirus NOT DETECTED NOT DETECTED Final   Norovirus GI/GII NOT DETECTED NOT DETECTED Final   Rotavirus A NOT DETECTED NOT DETECTED Final   Sapovirus (I, II, IV, and V) NOT DETECTED NOT DETECTED Final  MRSA PCR Screening     Status: None   Collection Time: 11/23/15  6:22 AM  Result Value Ref Range Status   MRSA by PCR NEGATIVE NEGATIVE Final    Comment:        The GeneXpert MRSA Assay (FDA approved for NASAL specimens only), is one component of a comprehensive MRSA colonization surveillance program. It is not intended to diagnose MRSA infection nor to guide or monitor treatment for MRSA infections.     Radiology Reports Ct Abdomen Pelvis Wo Contrast  11/22/2015  CLINICAL DATA:  Abdominal pain beginning over the last 2 days. No vomiting. Recent diagnosis of abdominal blockage. EXAM: CT ABDOMEN AND PELVIS WITHOUT CONTRAST TECHNIQUE: Multidetector CT imaging of the abdomen and pelvis was performed following the standard protocol without IV contrast. COMPARISON:  CTs, 11/07/2015 and 05/03/2014. FINDINGS: Lung bases: Heart is mildly enlarged. There are dense coronary artery calcifications. There is patchy and linear lung base opacity most evident in the left lower lobe, likely atelectasis.  Pneumonia is possible. No pulmonary edema. No pleural effusion. Hepatobiliary: Liver is unremarkable. Gallbladder is distended. There are dependent stones but no convincing wall thickening or pericholecystic fluid. No bile duct dilation. Spleen: Mild enlargement measuring 16.5 x 6.7 x 15.4 cm. No splenic mass or focal lesion. Stable. Pancreas: Pancreas is thickened, particularly the pancreatic head, where it surrounds the celiac axis and superior mesenteric artery vasculature. Lower attenuation areas in the pancreatic tail suggest poorly defined cysts or an irregular dilated duct. Margins of the pancreas are ill-defined. This appearance is similar to the prior exam. Additional soft tissue attenuation extends along the gastrohepatic ligament consistent with poorly defined adenopathy. Largest presumed node measures 16 mm in short axis. Adrenal glands:  No masses. Kidneys, ureters, bladder: Marked renal parenchymal thinning. Numerous bilateral renal cysts. No hydronephrosis. Ureters normal course and in caliber. Bladder is decompressed. Prostate gland:  Mild to moderately enlarged but stable. Ascites:  None. Gastrointestinal: Mild wall thickening suggested along the descending and sigmoid colon extending to the rectum. There is no bowel dilation to suggest obstruction. There are changes from a previous partial right colectomy, stable. Vascular: There are extensive atherosclerotic calcifications throughout the aorta and branch vessels. Musculoskeletal: Large subchondral cysts are noted along the  superior acetabula. There is increased density in the bony structures consistent with renal osteodystrophy. There multiple Schmorl's nodes along the spine. Abdominal wall: Small fat containing left para midline abdominal wall hernia just below the umbilicus. IMPRESSION: 1. Abnormal appearance of the pancreas. An infiltrating pancreatic neoplasm is suspected. This could be better defined with pancreatic MRI with and without  contrast if the patient can tolerate that procedure. There is evidence of gastrohepatic ligament chain adenopathy, extending along the porta hepatis. 2. Mild wall thickening of the descending colon, sigmoid colon and rectum consistent with a mild infectious or inflammatory colitis. No evidence of bowel obstruction. 3. Stable splenomegaly. 4. Distended gallbladder with dependent gallstones but no convincing acute cholecystitis. 5. Significant renal parenchymal thinning with multiple cysts with acquired renal cystic disease. 6. Increased opacity the base of the left lower lobe, most likely atelectasis. Consider pneumonia in the proper clinical setting. Electronically Signed   By: Lajean Manes M.D.   On: 11/22/2015 19:46   Ct Abdomen Pelvis Wo Contrast  11/07/2015  CLINICAL DATA:  Abdominal pain, elevated lipase. Right upper quadrant pain. EXAM: CT ABDOMEN AND PELVIS WITHOUT CONTRAST TECHNIQUE: Multidetector CT imaging of the abdomen and pelvis was performed following the standard protocol without IV contrast. COMPARISON:  Ultrasound earlier this day.  CT 05/06/2014 FINDINGS: Lower chest: Extensive coronary artery calcifications. Calcifications at the lung hila, partially included. Probable bilateral calcified hilar lymph nodes, suboptimally assessed. Atelectasis at the lung bases. Liver: No evidence of focal lesion allowing for lack contrast. Hepatobiliary: Distended gallbladder containing dependent gallstones. Previously assessed by ultrasound. Pancreas: Suboptimally assessed without contrast, however there is diffuse soft tissue fullness. Mild peripancreatic edema. Spleen: Enlarged measuring 15.4 cm craniocaudal. Adrenal glands: Fullness bilaterally.  No discrete nodule. Kidneys: Atrophic parenchyma. Multiple bilateral renal cysts, with a large parapelvic cyst on the left. Some of the cysts appear complicated. Extensive renovascular calcifications. Stomach/Bowel: Stomach distended with ingested contents. Dilated  fluid-filled proximal small bowel with transition in the mid right abdomen image 55 series 2. There is mesenteric edema and free fluid. No pneumatosis. More distal small bowel loops are decompressed. Enteric sutures noted at the base of the cecum. There is cecal and ascending colonic wall thickening. Small volume of stool throughout the remainder the colon. Vascular/Lymphatic: Dense atherosclerosis of the abdominal aorta. No aneurysm. No definite retroperitoneal adenopathy. Reproductive: Prominent sized prostate gland causing mass effect on the bladder base. Bladder: Decompressed. Other: Small volume of free fluid in the abdomen and pelvis. No evidence of free air. Postsurgical change in the anterior abdominal wall with small fat containing periumbilical ventral abdominal wall hernia. Musculoskeletal: There are no acute or suspicious osseous abnormalities. 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 Bony fusion of the sacroiliac joints. Degenerative change throughout spine. IMPRESSION: 1. Small-bowel obstruction with transition point in the right mid abdomen. 2. Soft tissue fullness about the entire pancreas. There is mild peripancreatic edema, however diffuse mesenteric edema is also seen. Underlying pancreatic mass cannot be excluded. If patient is able tolerate breath hold technique, MRI may be of value. 3. Splenomegaly. 4. Cholelithiasis, please reference same-day right upper quadrant ultrasound. 5. Advanced atherosclerosis. 6. Additional chronic findings as described. Electronically Signed   By: Jeb Levering M.D.   On: 11/07/2015 05:55   Mr Abdomen Wo Contrast  11/25/2015  CLINICAL DATA:  Evaluate pancreas mass EXAM: MRI ABDOMEN WITHOUT CONTRAST TECHNIQUE: Multiplanar multisequence MR imaging was performed without the administration of intravenous contrast. COMPARISON:  11/22/2015 FINDINGS: Exam detail diminished due to  motion artifact as well as lack of IV contrast material. Lower  chest:  No pleural fluid identified. Hepatobiliary: Abnormal signal of the liver in spleen are noted suggestive of hemochromatosis. No mass identified. Normal appearance of the gallbladder. No biliary dilatation identified. Pancreas: There is abnormal dilatation of the pancreatic duct from the body through the tail. Within the head and neck of pancreas there is a and ill defined area of soft tissue infiltration which is difficult to assess due to lack of IV contrast material. This measures 5.6 cm transverse 2.9 cm AP and 5.9 cm in length. Spleen: The spleen appears enlarged measuring 16 cm in length. No focal splenic abnormality identified. Adrenals/Urinary Tract: The adrenal glands are normal. Changes of bilateral polycystic kidneys identified. Stomach/Bowel: The stomach appears normal. No pathologic dilatation of the upper abdominal bowel loops. Vascular/Lymphatic: Aortic atherosclerosis noted. No aneurysm. The portal vein is not visualized and is presumably occluded by mass. They SMA and celiac arteries have a common origin and both appear partially encased by this mass.No retroperitoneal adenopathy identified peripancreatic lymph node is enlarged measuring 1.8 cm, image 47 of series 11. Other: No significant free fluid or abnormal fluid collections identified. Musculoskeletal: There is diffuse abnormal signal throughout the bone marrow suggestive of iron overload. IMPRESSION: 1. Poorly defined mass involving the head of pancreas and obstructing the pancreatic duct is again noted. Suspicious for pancreatic adenocarcinoma. This is difficult to further characterize above what was seen on CT from 11/22/2015. This is largely a reflection of lack of IV contrast material (due to chronic kidney disease) and motion artifact. There appears to be involvement of the celiac and SMA arteries and occlusion of the portal vein. Assuming no no allergy to IV contrast material then further assessment of this lesion with pancreas  protocol CT of the abdomen would be advised followed by dialysis. 2. Abnormal signal within the bone marrow, liver and spleen is suggestive of hemochromatosis. 3. Aortic atherosclerosis 4. Bilateral end-stage polycystic kidneys. Electronically Signed   By: Kerby Moors M.D.   On: 11/25/2015 11:29   Dg Chest Port 1 View  11/22/2015  CLINICAL DATA:  Patient comes [facility] for post CVA post MI. Patient started having Diarrhea since Friday. Patient started having Abd pain and fever 103.1 . Hx of diabetes, stroke, HTN. EXAM: PORTABLE CHEST 1 VIEW COMPARISON:  05/03/2014 FINDINGS: Nasogastric tube has been removed. Heart is mildly enlarged. Film is made with shallow lung inflation. There is linear density and adjacent lucency at the right lung base, likely related to linear atelectasis. However it is difficult to exclude free intraperitoneal air. There is focal atelectasis or early infiltrate at the left lung base. Mild pulmonary edema is noted. IMPRESSION: 1. Question of free intraperitoneal air beneath the right hemidiaphragm. Further evaluation with left lateral decubitus view of the abdomen or CT of the abdomen and pelvis is recommended. 2. Bibasilar atelectasis or infiltrates, left greater than right. 3. Mild interstitial pulmonary edema. Electronically Signed   By: Nolon Nations M.D.   On: 11/22/2015 17:40   Dg Abd 2 Views  11/08/2015  CLINICAL DATA:  Small-bowel obstruction EXAM: ABDOMEN - 2 VIEW COMPARISON:  CT abdomen 11/07/2015 FINDINGS: Mildly dilated small bowel loops with air-fluid levels. Gas in the transverse colon with air-fluid levels. Upright view does not include the right hemidiaphragm. This would need to be repeated if there is concern of free air. IMPRESSION: Air-fluid levels in large and small bowel may be related to ileus or resolving  small bowel obstruction Right hemidiaphragm not included on the upright view. If there is concern of free air, repeat upright view could be  performed. Electronically Signed   By: Franchot Gallo M.D.   On: 11/08/2015 15:01   Dg Abd Portable 2v  11/22/2015  CLINICAL DATA:  Diarrhea since Friday with history previous small-bowel obstruction. EXAM: PORTABLE ABDOMEN - 2 VIEW COMPARISON:  X-ray 11/08/2015.  CT scan 10/30/2015. FINDINGS: Right-side-up decubitus film shows no evidence for intraperitoneal free air. Diffuse gaseous small bowel distention is noted. There is some high attenuation which appears to be in small bowel loops over the central abdomen, nonspecific. Given the central clustering of small bowel , ventral hernia would be a consideration. Bones are diffusely demineralized. , IMPRESSION: Diffuse gaseous bowel distention without evidence for intraperitoneal free air. Ileus or small-bowel obstruction could have this appearance. Central clustering of small bowel raises the question for bowel herniation into a ventral hernia which was seen on the previous CT scan. Electronically Signed   By: Misty Stanley M.D.   On: 11/22/2015 19:03   Mr Lambert Mody Cm/mrcp  11/28/2015  CLINICAL DATA:  Patient with pancreatic head mass suspicious for adenocarcinoma. EXAM: MRI ABDOMEN WITHOUT CONTRAST  (INCLUDING MRCP) TECHNIQUE: Multiplanar multisequence MR imaging of the abdomen was performed. Heavily T2-weighted images of the biliary and pancreatic ducts were obtained, and three-dimensional MRCP images were rendered by post processing. COMPARISON:  MRI 11/25/2015; CT abdomen pelvis 11/22/2015. FINDINGS: Examination limited secondary to motion artifact and lack of intravenous contrast material. Lower chest:  No large pleural effusion. Hepatobiliary: Diffuse abnormal signal of the liver suggestive of hemochromatosis. Gallbladder is distended. Layering stones/sludge within the gallbladder lumen. No definite intrahepatic biliary ductal dilatation identified. Pancreas: Re- demonstrated dilatation of the main pancreatic duct throughout the body and tail measuring up to  11 mm. Markedly limited evaluation of the pancreas due to extensive motion artifact and lack of IV contrast. Suggestion of abnormal irregular soft tissue within the region of the pancreatic head. This is difficult to measure given the above described limitations. Spleen: Enlarged measuring approximately 16 cm. Adrenals/Urinary Tract: Normal adrenal glands. Bilateral polycystic kidneys. Stomach/Bowel: No evidence for bowel obstruction. Vascular/Lymphatic: Normal caliber abdominal aorta. No definite retroperitoneal adenopathy. The portal vein is not visualized and appears to be occluded from the suspected pancreatic head mass. Re- demonstrated enlarged 1.6 cm peripancreatic lymph node. Other: No significant free fluid in the abdomen. Musculoskeletal: Abnormal signal throughout the bone marrow suggestive of iron overload. IMPRESSION: Markedly limited examination secondary to extensive motion artifact and lack of intravenous contrast material. There is suggestion of a poorly defined mass involving the head of the pancreas with obstruction of the pancreatic duct suspicious for pancreatic adenocarcinoma. Consider dedicated evaluation with pancreatic protocol CT if the patient is on hemodialysis and able to receive intravenous contrast material. Abnormal signal in the bone marrow, liver and spleen suggestive of hemochromatosis. Polycystic kidney disease. Electronically Signed   By: Lovey Newcomer M.D.   On: 11/28/2015 07:55   US Abdomen Limited Ruq  11/07/2015  CLINICAL DATA:  Chronic periumbilical pain for 1 month. Initial encounter. EXAM: US ABDOMEN LIMITED - RIGHT UPPER QUADRANT COMPARISON:  CT of the abdomen and pelvis from 05/03/2014 FINDINGS: Gallbladder: Stones and sludge are noted dependently within the gallbladder. The gallbladder is otherwise unremarkable. No significant gallbladder wall thickening or pericholecystic fluid is seen. No ultrasonographic Murphy's sign is elicited. Common bile duct: Diameter: 0.6 cm,  within normal limits in caliber. Liver: No  focal lesion identified. Within normal limits in parenchymal echogenicity. IMPRESSION: 1. No acute abnormality seen at the right upper quadrant. 2. Stones and sludge seen dependently within the gallbladder. Gallbladder otherwise unremarkable in appearance. Electronically Signed   By: Garald Balding M.D.   On: 11/07/2015 01:20    Time Spent in minutes  25   Iretha Kirley K M.D on 11/29/2015 at 9:24 AM  Between 7am to 7pm - Pager - [phone]  After 7pm go to [email or web address] - password [facility]  [facility] -  Office  [phone]

## 2015-11-29 NOTE — Progress Notes (Signed)
Physical Therapy Treatment Patient Details Name: Sean [name] MRN: [id number] DOB: 1936-06-11 Today's Date: 11/29/2015    History of Present Illness HPI: Sean [name] is a 80 y.o. male with medical history significant of ischemic colitis s/p bowel resection, cholelithiasis, gallstone pancreatitis, diet controlled DM2, ESRD on Hd (MWF), caciphylaxis, s/p Left AKA, hypotension, HLD, anemia, depression with anxiety, blind Rt eye; who presents with diarrhea and abdominal pain and fever. positive CDiff colitis; developed GI bleed, underwent EGD    PT Comments    Very pleasant gentleman. Very independent-minded and assumes he can do what he was doing prior to this hospitalization--decreased awareness of deficits and decr safety awareness. Overall did very well considering has not been up on his prosthesis since 6/13. Eager to continue therapy at [facility].   Follow Up Recommendations  SNF     Equipment Recommendations  None recommended by PT    Recommendations for Other Services       Precautions / Restrictions Precautions Precautions: Fall Precaution Comments: blind Rt eye Restrictions Weight Bearing Restrictions: No    Mobility  Bed Mobility Overal bed mobility: Modified Independent Bed Mobility: Supine to Sit     Supine to sit: Modified independent (Device/Increase time)     General bed mobility comments: HOB elevated   Transfers Overall transfer level: Needs assistance Equipment used: Rolling walker (2 wheeled) Transfers: Sit to/from Stand Sit to Stand: Min guard;+2 safety/equipment         General transfer comment: Pt requests no gait belt while standing to get further into prosthesis; proper use of RW and steady  Ambulation/Gait Ambulation/Gait assistance: Min assist;+2 safety/equipment Ambulation Distance (Feet): 6 Feet Assistive device: Rolling walker (2 wheeled) Gait Pattern/deviations: Step-to pattern;Decreased stride length;Trunk flexed;Wide base of  support Gait velocity: very slow Gait velocity interpretation: Below normal speed for age/gender General Gait Details: used hospital socks on prosthesis and Rt foot b/c pt did not have his Rt shoe here and wanted to be "balanced" This made his prosthetic foot slightly less stable/able to slide more easily however pt was able to control. Pt did allow gait belt donned once ready to walk   Stairs            Wheelchair Mobility    Modified Rankin (Stroke Patients Only)       Balance Overall balance assessment: Needs assistance Sitting-balance support: No upper extremity supported;Feet unsupported Sitting balance-Leahy Scale: Good Sitting balance - Comments: Pt donning prosthetic seated EOB performing lateral leans to correctly place the socks   Standing balance support: Bilateral upper extremity supported Standing balance-Leahy Scale: Poor Standing balance comment: stood x 2 minutes adjusting prosthesis to improve fit                    Cognition Arousal/Alertness: Awake/alert Behavior During Therapy: WFL for tasks assessed/performed Overall Cognitive Status: Within Functional Limits for tasks assessed                      Exercises      General Comments General comments (skin integrity, edema, etc.): Able to direct his care and is very independent-minded.      Pertinent Vitals/Pain HR 90s, BP 122/59 no symptoms of orhtostasis  Pain Assessment: No/denies pain    Home Living                      Prior Function            PT Goals (current  goals can now be found in the care plan section) Acute Rehab PT Goals Patient Stated Goal: Sean [name] long term goal is to get to an Assisted Living facility Time For Goal Achievement: 12/07/15 Progress towards PT goals: Progressing toward goals    Frequency  Min 3X/week    PT Plan Current plan remains appropriate    Co-evaluation             End of Session Equipment Utilized During  Treatment: Gait belt Activity Tolerance: Patient tolerated treatment well Patient left: in chair;with call bell/phone within reach;Other (comment) (MD's in room)     Time: [phone] PT Time Calculation (min) (ACUTE ONLY): 30 min  Charges:  $Gait Training: 8-22 mins $Therapeutic Activity: 8-22 mins                    G Codes:      Sean [name] 12-27-2015, 9:34 AM Pager [phone]

## 2015-11-29 NOTE — Progress Notes (Signed)
Daily Rounding Note  11/29/2015, 10:44 AM  LOS: 7 days   SUBJECTIVE:       2 loose, brown stools yesterday, down from  8 to 10 daily and no longer fecal incontinent.  No n/v.  Persistent moderate pain in LUQ radiating to back, effectively controlled with Hydrocodone: last given on 6/18 at 2050  OBJECTIVE:         Vital signs in last 24 hours:    Temp:  [97.5 F (36.4 C)-98.9 F (37.2 C)] 98.4 F (36.9 C) (06/19 0753) Pulse Rate:  [58-101] 89 (06/19 1000) Resp:  [10-30] 30 (06/19 1000) BP: (87-148)/(40-77) 122/59 mmHg (06/19 0753) SpO2:  [90 %-100 %] 99 % (06/19 1000) Weight:  [72 kg (158 lb 11.7 oz)] 72 kg (158 lb 11.7 oz) (06/19 0300) Last BM Date: 11/29/15 Filed Weights   11/26/15 1239 11/28/15 0339 11/29/15 0300  Weight: 69.8 kg (153 lb 14.1 oz) 70.3 kg (154 lb 15.7 oz) 72 kg (158 lb 11.7 oz)   General: somewhat frail but not as ill looking as expected based on his myriad problems   Heart: RRR Chest: clear bil.  No labored breathing Abdomen: soft, active BS.  ND.  Tender (mild) in LUQ.    Extremities: AKA stump on Left, right leg/ankle/foot without swelling Neuro/Psych:  Oriented.  Follows commands, Alert and appropriate.   Intake/Output from previous day: 06/18 0701 - 06/19 0700 In: 2164.2 [P.O.:960; I.V.:539.2; Blood:365; IV Piggyback:300] Out: -   Intake/Output this shift: Total I/O In: 755 [P.O.:240; I.V.:515] Out: 2 [Stool:2]  Lab Results:  Recent Labs  11/26/15 1236  11/28/15 1522 11/29/15 0323 11/29/15 0739  WBC 9.4  --   --  7.0  --   HGB 8.7*  < > 7.2* 8.0*  8.1* 7.9*  HCT 27.5*  --   --  25.6*  --   PLT 205  --   --  163  --   < > = values in this interval not displayed. BMET  Recent Labs  11/26/15 1236 11/29/15 0323  NA 135 139  K 3.1* 3.1*  CL 99* 108  CO2 22 20*  GLUCOSE 197* 174*  BUN 47* 62*  CREATININE 7.09* 9.01*  CALCIUM 7.9* 7.8*   LFT  Recent Labs   11/26/15 1236 11/29/15 0323  PROT  --  5.9*  ALBUMIN 2.5* 2.4*  AST  --  41  ALT  --  16*  ALKPHOS  --  103  BILITOT  --  0.8   PT/INR No results for input(s): LABPROT, INR in the last 72 hours. Hepatitis Panel No results for input(s): HEPBSAG, HCVAB, HEPAIGM, HEPBIGM in the last 72 hours.  Studies/Results: Mr Lambert Mody Cm/mrcp  11/28/2015  CLINICAL DATA:  Patient with pancreatic head mass suspicious for adenocarcinoma. EXAM: MRI ABDOMEN WITHOUT CONTRAST  (INCLUDING MRCP) TECHNIQUE: Multiplanar multisequence MR imaging of the abdomen was performed. Heavily T2-weighted images of the biliary and pancreatic ducts were obtained, and three-dimensional MRCP images were rendered by post processing. COMPARISON:  MRI 11/25/2015; CT abdomen pelvis 11/22/2015. FINDINGS: Examination limited secondary to motion artifact and lack of intravenous contrast material. Lower chest:  No large pleural effusion. Hepatobiliary: Diffuse abnormal signal of the liver suggestive of hemochromatosis. Gallbladder is distended. Layering stones/sludge within the gallbladder lumen. No definite intrahepatic biliary ductal dilatation identified. Pancreas: Re- demonstrated dilatation of the main pancreatic duct throughout the body and tail measuring up to 11 mm. Markedly limited evaluation of  the pancreas due to extensive motion artifact and lack of IV contrast. Suggestion of abnormal irregular soft tissue within the region of the pancreatic head. This is difficult to measure given the above described limitations. Spleen: Enlarged measuring approximately 16 cm. Adrenals/Urinary Tract: Normal adrenal glands. Bilateral polycystic kidneys. Stomach/Bowel: No evidence for bowel obstruction. Vascular/Lymphatic: Normal caliber abdominal aorta. No definite retroperitoneal adenopathy. The portal vein is not visualized and appears to be occluded from the suspected pancreatic head mass. Re- demonstrated enlarged 1.6 cm peripancreatic lymph node.  Other: No significant free fluid in the abdomen. Musculoskeletal: Abnormal signal throughout the bone marrow suggestive of iron overload. IMPRESSION: Markedly limited examination secondary to extensive motion artifact and lack of intravenous contrast material. There is suggestion of a poorly defined mass involving the head of the pancreas with obstruction of the pancreatic duct suspicious for pancreatic adenocarcinoma. Consider dedicated evaluation with pancreatic protocol CT if the patient is on hemodialysis and able to receive intravenous contrast material. Abnormal signal in the bone marrow, liver and spleen suggestive of hemochromatosis. Polycystic kidney disease. Electronically Signed   By: Lovey Newcomer M.D.   On: 11/28/2015 07:55   Scheduled Meds: . sodium chloride   Intravenous Once  . atorvastatin  20 mg Oral QHS  . B-complex with vitamin C  1 tablet Oral QHS  . feeding supplement (PRO-STAT SUGAR FREE 64)  30 mL Oral Daily  . gabapentin  100 mg Oral QHS  . insulin aspart  0-5 Units Subcutaneous QHS  . insulin aspart  0-9 Units Subcutaneous TID WC  . metronidazole  500 mg Intravenous Q8H  . midodrine  5 mg Oral 3 times per day on Sun Tue Thu Sat   And  . midodrine  5 mg Oral 2 times per day on Mon Wed Fri  . [START ON 11/30/2015] pantoprazole  40 mg Intravenous Q12H  . sodium chloride flush  3 mL Intravenous Q12H  . vancomycin  500 mg Oral Q6H   Continuous Infusions: . sodium chloride 10 mL/hr at 11/29/15 0800  . pantoprozole (PROTONIX) infusion 8 mg/hr (11/29/15 0800)   PRN Meds:.acetaminophen, alum & mag hydroxide-simeth, Gerhardt's butt cream, HYDROcodone-acetaminophen, hydrOXYzine, morphine injection   ASSESMENT:   *  CG emesis and melenic stool. 6/18 EGD, Dr Collene Mares: grade 1 esophagitis, non-bleeding duodenal ulcer without stigmata.   *  C diff, on IV flagyl, po Vanc (6/13 began 2 week course).  Colitis noted on CT.  Diarrhea resolved  *  Pancreatic mass and gastrohepatic and  porta hepatis adenopathy, CA 19-9 has been obtained 2 times: 2918 and 2662.  A 3rd CA 19-9 is also in orders  *  Anemia, normocytic. Not on Epo, not on parenteral or PO iron.   *  ESRD   PLAN   *   Discontinued the additional CA 19-9 order.    *  Stopped IV flagyl   *  Switch to oral Protonix.  Given C diff, will dose once daily.  Advance to renal/carb mod diet.    Sean [name]  11/29/2015, 10:44 AM Pager: [phone]

## 2015-11-30 ENCOUNTER — Telehealth: Payer: Self-pay

## 2015-11-30 DIAGNOSIS — Z515 Encounter for palliative care: Secondary | ICD-10-CM | POA: Insufficient documentation

## 2015-11-30 LAB — TYPE AND SCREEN
ABO/RH(D): B POS
ANTIBODY SCREEN: NEGATIVE
Unit division: 0
Unit division: 0
Unit division: 0

## 2015-11-30 LAB — CBC
HCT: 26.9 % — ABNORMAL LOW (ref 39.0–52.0)
Hemoglobin: 8.7 g/dL — ABNORMAL LOW (ref 13.0–17.0)
MCH: 26.9 pg (ref 26.0–34.0)
MCHC: 32.3 g/dL (ref 30.0–36.0)
MCV: 83.3 fL (ref 78.0–100.0)
Platelets: 110 10*3/uL — ABNORMAL LOW (ref 150–400)
RBC: 3.23 MIL/uL — ABNORMAL LOW (ref 4.22–5.81)
RDW: 18.1 % — AB (ref 11.5–15.5)
WBC: 7.2 10*3/uL (ref 4.0–10.5)

## 2015-11-30 LAB — RENAL FUNCTION PANEL
ALBUMIN: 2.5 g/dL — AB (ref 3.5–5.0)
Anion gap: 12 (ref 5–15)
BUN: 26 mg/dL — AB (ref 6–20)
CALCIUM: 7.5 mg/dL — AB (ref 8.9–10.3)
CO2: 21 mmol/L — ABNORMAL LOW (ref 22–32)
CREATININE: 5.54 mg/dL — AB (ref 0.61–1.24)
Chloride: 102 mmol/L (ref 101–111)
GFR calc Af Amer: 10 mL/min — ABNORMAL LOW (ref >60)
GFR calc non Af Amer: 9 mL/min — ABNORMAL LOW (ref >60)
GLUCOSE: 111 mg/dL — AB (ref 65–99)
PHOSPHORUS: 1.7 mg/dL — AB (ref 2.5–4.6)
Potassium: 3.4 mmol/L — ABNORMAL LOW (ref 3.5–5.1)
SODIUM: 135 mmol/L (ref 135–145)

## 2015-11-30 LAB — GLUCOSE, CAPILLARY
GLUCOSE-CAPILLARY: 109 mg/dL — AB (ref 65–99)
GLUCOSE-CAPILLARY: 185 mg/dL — AB (ref 65–99)

## 2015-11-30 MED ORDER — POTASSIUM CHLORIDE CRYS ER 20 MEQ PO TBCR
40.0000 meq | EXTENDED_RELEASE_TABLET | Freq: Once | ORAL | Status: AC
  Administered 2015-11-30: 40 meq via ORAL
  Filled 2015-11-30: qty 2

## 2015-11-30 MED ORDER — HYDROCODONE-ACETAMINOPHEN 10-325 MG PO TABS: ORAL_TABLET | ORAL | Status: DC

## 2015-11-30 MED ORDER — PANTOPRAZOLE SODIUM 40 MG PO TBEC: 40.0000 mg | DELAYED_RELEASE_TABLET | Freq: Every day | ORAL | Status: DC

## 2015-11-30 MED ORDER — K PHOS MONO-SOD PHOS DI & MONO 155-852-130 MG PO TABS
250.0000 mg | ORAL_TABLET | Freq: Two times a day (BID) | ORAL | Status: DC
  Filled 2015-11-30: qty 1

## 2015-11-30 NOTE — Progress Notes (Signed)
[name] to be D/C'd Skilled nursing facility per MD order. Pt verbalized understanding.    Medication List    STOP taking these medications        loperamide 2 MG capsule  Commonly known as:  IMODIUM      TAKE these medications        acetaminophen 325 MG tablet  Commonly known as:  TYLENOL  Take 650 mg by mouth every 4 (four) hours as needed for mild pain, fever or headache (DO NOT EXCEED 4 GM OF APAP IN 24 HOURS FORM ALL SOURCES).     aspirin 81 MG chewable tablet  Chew 81 mg by mouth daily.     atorvastatin 20 MG tablet  Commonly known as:  LIPITOR  Take 20 mg by mouth at bedtime.     b-complex w/C Tabs tablet  Take 1 tablet by mouth at bedtime.     docusate sodium 100 MG capsule  Commonly known as:  COLACE  Take 100 mg by mouth daily.     feeding supplement (PRO-STAT SUGAR FREE 64) Liqd  Take 30 mLs by mouth daily.     gabapentin 100 MG capsule  Commonly known as:  NEURONTIN  Take 100 mg by mouth at bedtime.     HYDROcodone-acetaminophen 10-325 MG tablet  Commonly known as:  NORCO  Take one tablet by mouth every 8 hours as needed for moderate pain. Do not exceed 4gm of Tylenol in 24 hours     metroNIDAZOLE 500 MG tablet  Commonly known as:  FLAGYL  Take 1 tablet (500 mg total) by mouth 3 (three) times daily.     midodrine 5 MG tablet  Commonly known as:  PROAMATINE  Take 5 mg by mouth 3 (three) times daily. Do not take 6 am dose on M- W- F- d/t  dialysis days.     pantoprazole 40 MG tablet  Commonly known as:  PROTONIX  Take 1 tablet (40 mg total) by mouth daily at 6 (six) AM.     sevelamer carbonate 800 MG tablet  Commonly known as:  RENVELA  Take 800 mg by mouth daily with lunch. 12 pm and 5 pm.     sorbitol 70 % solution  Take 30 mLs by mouth daily as needed (severe constipation).     vancomycin 50 mg/mL oral solution  Commonly known as:  VANCOCIN  Take 10 mLs (500 mg total) by mouth every 6 (six) hours.     ZOFRAN 4 MG tablet   Generic drug:  ondansetron  Take 4 mg by mouth every 6 (six) hours as needed for nausea.        Filed Vitals:   11/30/15 0615 11/30/15 0744  BP: 137/68 139/61  Pulse: 89 85  Temp: 98.5 F (36.9 C) 98.7 F (37.1 C)  Resp: 18 18    Skin clean, dry and intact without evidence of skin break down, no evidence of skin tears noted. IV catheter discontinued intact. Site without signs and symptoms of complications. Dressing and pressure applied. Pt denies pain at this time. No complaints noted.   Patient escorted via ambulance, and D/C to [facility].  [name] RN [facility] Phone [phone]

## 2015-11-30 NOTE — Telephone Encounter (Signed)
Sean [name],    I think EUS can help with diagnosis. Prefer him to be a bit recovered from c. Diff and ulcer. I will send to [facility] to work on out patient EUS, probably will be July 6 or 13th (i'm out of town next week). Thanks         Shreyas Piatkowski,   This man needs upper EUS. July 6th or 13th. ++MAC, pancreatic mass. Thanks      DJ         ----- Message -----    From: Jerene Bears, MD    Sent: 11/29/2015  4:24 PM     To: Milus Banister, MD   Subject: Inpatient Notes                     Dan,   Will you take a look at this patient   Currently hospitalized. Found to have duodenal ulcer and C. difficile. Both being treated   Has a pancreatic mass. Palate of care involved but patient prefers to continue diagnostic evaluation to determine if he truly has pancreatic cancer and then consider treatment options with oncology.   Could you help with EUS?

## 2015-11-30 NOTE — Progress Notes (Signed)
Report given to Tammy at [facility]

## 2015-11-30 NOTE — Care Management Important Message (Signed)
Important Message  Patient Details  Name: PARAG BASOM MRN: [id number] Date of Birth: 1936/02/19   Medicare Important Message Given:  Yes    Gabrille Kilbride, Rory Percy, RN 11/30/2015, 3:04 PM

## 2015-11-30 NOTE — Progress Notes (Signed)
   11/30/15 1000  Clinical Encounter Type  Visited With Patient;Health care provider  Visit Type Initial  Referral From Nurse  Consult/Referral To Chaplain  Spiritual Encounters  Spiritual Needs Prayer  Stress Factors  Patient Stress Factors Health changes;Loss of control  Advance Directives (For Healthcare)  Does patient have an advance directive? No  Would patient like information on creating an advanced directive? Yes - Scientist, clinical (histocompatibility and immunogenetics) given  Chaplain responded to consult, provided advance directive material to be completed with son upon his arrival, [facility] team to return for signing/witnessing/notarizing. Also provide prayer, spiritual presence and staff support.

## 2015-11-30 NOTE — Progress Notes (Signed)
Daily Progress Note   Patient Name: Sean [name]       Date: 11/30/2015 DOB: 05-08-1936  Age: 80 y.o. MRN#: [id number] Attending Physician: Thurnell Lose, MD Primary Care Physician: [name], MD Admit Date: 11/22/2015  Reason for Consultation/Follow-up: Establishing goals of care and Psychosocial/spiritual support  Subjective: Feeling pretty good, eating small amounts.  Diarrhea improved.   States that everyday at the SNF he gets himself up, baths himself, dresses and puts on his own prosthesis.  He is called Mr. Independent by the SNF staff and he is proud of that.   He tells me that recently it has been much harder to get up and get going.  He indicates his energy level is low.  We talked about code status.  The patient wants to be a full code, but is very clear that he does not want prolonged life support.  He states he would not want his life artificially prolonged. He states his son understands what he wants.  The patient has a very strong faith.  He finds his security in his relationship with God and is not afraid of what may happen.  I asked him to tell me about his health.  He didn't mention the pancreatic mass or the possibility of cancer.  When I mentioned it he did not seem surprised.  Length of Stay: 8  Current Medications: Scheduled Meds:  . sodium chloride   Intravenous Once  . atorvastatin  20 mg Oral QHS  . B-complex with vitamin C  1 tablet Oral QHS  . darbepoetin (ARANESP) injection - DIALYSIS  150 mcg Intravenous Q Mon-HD  . feeding supplement (PRO-STAT SUGAR FREE 64)  30 mL Oral Daily  . gabapentin  100 mg Oral QHS  . insulin aspart  0-5 Units Subcutaneous QHS  . insulin aspart  0-9 Units Subcutaneous TID WC  . midodrine  5 mg Oral 3 times per day on Sun  Tue Thu Sat   And  . midodrine  5 mg Oral 2 times per day on Mon Wed Fri  . pantoprazole  40 mg Oral Q0600  . sodium chloride flush  3 mL Intravenous Q12H  . vancomycin  500 mg Oral Q6H    Continuous Infusions: . sodium chloride 500 mL (11/29/15 1639)    PRN Meds: sodium  chloride, sodium chloride, acetaminophen, alteplase, Gerhardt's butt cream, heparin, HYDROcodone-acetaminophen, hydrOXYzine, lidocaine (PF), lidocaine-prilocaine, morphine injection, pentafluoroprop-tetrafluoroeth  Physical Exam    Very pleasant gentleman, A&O, NAD, clear speech CV rrr Resp NAD Abd Soft, NT Extremities Left       Vital Signs: BP 139/61 mmHg  Pulse 85  Temp(Src) 98.7 F (37.1 C) (Oral)  Resp 18  Ht 5\' 5"  (1.651 m)  Wt 72.2 kg (159 lb 2.8 oz)  BMI 26.49 kg/m2  SpO2 99% SpO2: SpO2: 99 % O2 Device: O2 Device: Not Delivered O2 Flow Rate: O2 Flow Rate (L/min): 1 L/min  Intake/output summary:  Intake/Output Summary (Last 24 hours) at 11/30/15 1258 Last data filed at 11/30/15 0915  Gross per 24 hour  Intake    350 ml  Output    500 ml  Net   -150 ml   LBM: Last BM Date: 11/30/15 Baseline Weight: Weight: 70.444 kg (155 lb 4.8 oz) Most recent weight: Weight: 72.2 kg (159 lb 2.8 oz)       Palliative Assessment/Data:    Flowsheet Rows        Most Recent Value   Intake Tab    Referral Department  Hospitalist   Unit at Time of Referral  Intermediate Care Unit   Palliative Care Primary Diagnosis  Cancer   Date Notified  11/29/15   Palliative Care Type  New Palliative care   Reason for referral  Clarify Goals of Care   Date of Admission  11/22/15   Date first seen by Palliative Care  11/29/15   # of days Palliative referral response time  0 Day(s)   # of days IP prior to Palliative referral  7   Clinical Assessment    Palliative Performance Scale Score  30%   Pain Max last 24 hours  5   Pain Min Last 24 hours  4   Dyspnea Max Last 24 Hours  4   Dyspnea Min Last 24 hours  3    Psychosocial & Spiritual Assessment    Palliative Care Outcomes    Patient/Family meeting held?  Yes   Who was at the meeting?  patient, son over the phone      Patient Active Problem List   Diagnosis Date Noted  . Encounter for palliative care   . Goals of care, counseling/discussion   . Duodenal ulcer disease   . Esophagitis   . Acute upper GI bleed 11/27/2015  . Pancreatic mass 11/26/2015  . Coffee ground emesis   . Melena   . C. difficile colitis 11/23/2015  . Sepsis ([place]) 11/23/2015  . Chronic systolic CHF (congestive heart failure) ([place])   . Gallstone pancreatitis 11/07/2015  . Abdominal pain, epigastric 11/07/2015  . PAD (peripheral artery disease) ([place]) 09/14/2015  . Phantom pain ([place]) 09/14/2015  . Hemodialysis-associated hypotension 09/14/2015  . Protein-calorie malnutrition ([place]) 09/14/2015  . Type 2 diabetes mellitus with chronic kidney disease on chronic dialysis, without long-term current use of insulin ([place]) 03/25/2015  . Chronic combined systolic and diastolic heart failure ([place]) 03/01/2015  . Neuropathic pain 01/12/2015  . Abnormal CT scan 05/29/2014  . Ischemic colitis ([place]) 05/08/2014  . Cholelithiasis   . Atherosclerotic peripheral vascular disease with ulceration ([place])   . Colitis, ischemic ([place]) 05/03/2014  . S/P AKA (above knee amputation) unilateral ([place]) 05/03/2014  . Dyslipidemia 04/07/2014  . Femoropopliteal arterial thrombosis of left lower extremity ([place]) 01/27/2014  . Depression 01/01/2014  . Secondary hyperparathyroidism ([place]) 12/20/2013  . CAD (coronary artery  disease), native coronary artery 12/16/2013  . Diabetes mellitus due to underlying condition ([place]) 09/19/2011  . Anxiety state 01/15/2009  . Acute myocardial infarction ([place]) 01/15/2009  . End stage renal disease on dialysis [facility]) 01/15/2009    Palliative Care Assessment & Plan   Patient Profile: 80 yo male with  PMH of ESRD on HD, S/P left AKA, diet controlled DM, gallstone  pancreatitis.   Presented with abdominal pain and diarrhea.  He was found to have c-diff diarrhea and anemia.   MR Abdomen showed dilation of the main pancreatic duct up to 11 mm.  Ca 19-9 2918 on 6/14 and 2662 on 6/15.  Concern for adenocarcinoma of the pancreas.  Recommendations/Plan:  Return to SNF with palliative medicine follow up.    EUS to be scheduled 7/6 or 7/13 with Dr. Oretha Caprice of [facility].  Goals of Care and Additional Recommendations:  Limitations on Scope of Treatment: Full Scope Treatment  Code Status:  DNR  Prognosis:  Unable to determine:  less than 6 months as he most probably has pancreatic cancer in the setting of ESRD with anemia, and CAD.  Discharge Planning:  [place] for rehab with Palliative care service follow-up  Care plan was discussed with patient  Thank you for allowing the Palliative Medicine Team to assist in the care of this patient.   Time In: 10:00 Time Out: 10:20 Total Time 20 MIN Prolonged Time Billed  No      Greater than 50%  of this time was spent counseling and coordinating care related to the above assessment and plan.  Imogene Burn, PA-C Palliative Medicine Pager: [phone]  Please contact Palliative Medicine Team phone at [phone] for questions and concerns.

## 2015-11-30 NOTE — Discharge Summary (Addendum)
Sean [name], is a 80 y.o. male  DOB January 16, 1936  MRN [id number].  Admission date:  11/22/2015  Admitting Physician  Ivor Costa, MD  Discharge Date:  11/30/2015   Primary MD  [name], MD  Recommendations for primary care physician for things to follow:   Involve palliative care as soon as possible, patient and family at this time do not want evaluation of pancreatic mass. They want him to be full code, they want to continue dialysis. But if declines they are leaning more towards comfort care and hospice. Kindly involve palliative care as soon as possible.   Admission Diagnosis  Pain [R52] Fever [R50.9] C. difficile colitis [A04.7]   Discharge Diagnosis  Pain [R52] Fever [R50.9] C. difficile colitis [A04.7]     Principal Problem:   C. difficile colitis Active Problems:   Anxiety state   End stage renal disease on dialysis ([place])   CAD (coronary artery disease), native coronary artery   Secondary hyperparathyroidism (HCC)   Depression   S/P AKA (above knee amputation) unilateral (HCC)   Cholelithiasis   Ischemic colitis ([place])   Type 2 diabetes mellitus with chronic kidney disease on chronic dialysis, without long-term current use of insulin (HCC)   PAD (peripheral artery disease) (HCC)   Chronic systolic CHF (congestive heart failure) (HCC)   Sepsis (HCC)   Pancreatic mass   Coffee ground emesis   Melena   Acute upper GI bleed   Encounter for palliative care   Goals of care, counseling/discussion   Duodenal ulcer disease   Esophagitis      Past Medical History  Diagnosis Date  . Hyperparathyroidism   . Arthritis   . Hyperlipidemia   . Hypertension   . Stroke ([place])   . GERD (gastroesophageal reflux disease)   . Migraine   . Heart attack ([place]) 2011; 1990's  . Peripheral neuropathy  ([place])   . Depression with anxiety   . Dialysis patient [facility])     M, W, F; [place] CAD (coronary artery disease)     a. s/p NSTEMI and failed RCA PCI in 2011. b. Nuc 12/2013: nonischemic, prior inferior MI.  Marland Kitchen LBBB (left bundle branch block)   . Calciphylaxis     a. Multiple admissions for nonhealing L ulcer/sepsis, s/p L AKA 02/02/2014.  Marland Kitchen Chronic anemia   . Dementia   . ESRD (end stage renal disease) on dialysis ([place])   . Renal insufficiency   . Hypotension     renovascular  . History of left bundle branch block   . Type II diabetes mellitus ([place])     not taking any medications.  . Cholelithiasis 01/2014    acute cholecystitis managed with perc drain due to high surgical risk.     Past Surgical History  Procedure Laterality Date  . Parathyroidectomy  2011  . Capd insertion  09/12/2011    Procedure: LAPAROSCOPIC INSERTION CONTINUOUS AMBULATORY PERITONEAL DIALYSIS  (CAPD) CATHETER;  Surgeon: Edward Jolly, MD;  Location: [facility];  Service: General;  Laterality: N/A;  . Sp av dialysis shunt access existing *l*  2003  . Dg av dialysis shunt intro ndl*r* or      "not working" (09/19/11)  . Capd removal  03/12/2012    Procedure: CONTINUOUS AMBULATORY PERITONEAL DIALYSIS  (CAPD) CATHETER REMOVAL;  Surgeon: Edward Jolly, MD;  Location: [facility];  Service: General;  Laterality: N/A;  . Amputation Left 02/02/2014    Procedure: AMPUTATION ABOVE KNEE-LEFT;  Surgeon: Elam Dutch, MD;  Location: [facility];  Service: Vascular;  Laterality: Left;  . I&d extremity Left 03/31/2014    Procedure: IRRIGATION AND DEBRIDEMENT EXTREMITY - LEFT ABOVE KNEE AMPUTATION;  Surgeon: Elam Dutch, MD;  Location: [facility];  Service: Vascular;  Laterality: Left;  . Application of wound vac Left 03/31/2014    Procedure: APPLICATION OF WOUND VAC - LEFT AKA;  Surgeon: Elam Dutch, MD;  Location: [facility];  Service: Vascular;  Laterality: Left;  . Colon surgery  2011    colon resection   .  Colonoscopy with propofol N/A 07/14/2014    Procedure: COLONOSCOPY WITH PROPOFOL;  Surgeon: Jerene Bears, MD;  Location: [facility];  Service: Gastroenterology;  Laterality: N/A;  . Esophagogastroduodenoscopy Left 11/28/2015    Procedure: ESOPHAGOGASTRODUODENOSCOPY (EGD);  Surgeon: Juanita Craver, MD;  Location: [facility];  Service: Endoscopy;  Laterality: Left;       HPI  from the history and physical done on the day of admission:    80 year old male with history of ischemic colitis status post bowel resection, cholelithiasis, recent hospitalization for gallstone pancreatitis, ESRD on dialysis (M, W, F), status post left AKA, hypotension, anemia, anxiety and depression, diet-controlled diabetes mellitus presented with abdominal pain as stated with fever and diarrhea for the past 3 days. Reported 6-7 bowel movements with loose stools with periumbilical pain, [id number] in severe at the nonradiating. Reported nausea but no vomiting. He reported having fever of 103F at home.  In the ED stool first C. difficile was positive, had mildly elevated lactic acid and had low-grade temperature. Creatinine was 8.69. CT abdomen pelvis done in the ED showed mild infectious versus inflammatory colitis of the descending, sigmoid colon and rectum. Also showed suspicious infiltrating pancreatic neoplasm.  Patient refused further CT imaging with pancreatic protocol and plan for biopsy as outpatient. He was planned to be discharged back to skilled nursing facility on 6/16 after dialysis. However during dialysis he had 2 episodes of coffee-ground emesis and further dialysis was aborted. Repeat CBC showed some drop in H&H. GI consulted who evaluated the patient and offered EGD but he refused. Patient was placed on Protonix drip , Made nothing by mouth and transfer to stepdown unit.  He was transferred to my care on 11/28/2015 on day 6 of his hospital stay.      Hospital Course:     Sepsis with C. difficile colitis -  Sepsis resolved. On oral vancomycin and Flagyl. Diarrhea resolved... Plan on 2 weeks of antibiotics total (stop date 12/06/2015)  Acute upper GI bleed Due to duodenal bulb cratered ulcer found on EGD -  Received 1 unit of packed RBC on 11/28/2015, currently H&H stable, Continue PPI . Underwent EGD on 11/28/2015 with duodenal bulb ulcer which was cratered, continue to monitor H&H. This lesion was not biopsied, follow with Dr. Collene Mares one time postdischarge.  Recent history of gallstone pancreatitis - Was scheduled for cholecystectomy on 6/14. Surgery was consulted who recommended holding off until acute illness had resolved. No clinical  signs or symptoms of acute pancreatitis. Should follow-up with surgery in 2 weeks post DC.  ? Pancreatic adenocarcinoma - MRI without contrast done shows poorly defined mass over head of pancreas and obstructive pancreatic duct suspicious for adenocarcinoma. Possible involvement of SMA , celiac arteries and occlusion of portal vein. Recommend CT abd with contrast with pancreatic protocol. CA 19-9 markedly elevated to 2400. Patient refused CT with pancreatic protocol.  Discussed with son and patient who at this time are leaning more words no workup for pancreatic mass, palliative care was consulted, they are agreeing to follow with palliative care at the nursing home. They still want him to be full code, they want to continue dialysis but they are not willing to pursue pancreatic mass workup at this time. Please involve palliative care at SNF. These discuss with son one more time to confirm the plan. He may eventually benefit from a DO NOT RESUSCITATE status.   End stage renal disease on dialysis [facility]) renal on board getting dialysis per schedule, he is on Monday, Wednesday and Friday schedule.  CAD Hold aspirin due to GI bleed. Continue Lipitor   Chronic systolic CHF (congestive heart failure) ([place]): Euvolemic. Volume management with dialysis.  Diet-controlled diabetes  mellitus - stable with diet control, may monitor CBGs every before meals at bedtime.       Follow UP  Follow-up Information    Follow up with Gayland Curry, MD. Schedule an appointment as soon as possible for a visit in 2 weeks.   Specialty:  General Surgery   Why:  MD at [facility]   Contact information:   [place] [address] [phone]       Follow up with [name], MD. Schedule an appointment as soon as possible for a visit in 1 week.   Specialty:  Internal Medicine   Contact information:   [place] [address] [phone]       Follow up with Betsy Coder, MD. Schedule an appointment as soon as possible for a visit in 1 week.   Specialty:  Oncology   Why:  if desired for pancreatic mass follow-up   Contact information:   [address] [place] [address] [phone]       Follow up with [name], MD. Schedule an appointment as soon as possible for a visit in 1 week.   Specialty:  Internal Medicine   Contact information:   [place] [address] [phone]       Follow up with MANN,JYOTHI, MD. Schedule an appointment as soon as possible for a visit in 1 week.   Specialty:  Gastroenterology   Contact information:   [address] [place] [address] [phone]        Consults obtained - Renal, palliative care, GI  Discharge Condition: [place]  Diet and Activity recommendation: See Discharge Instructions below  Discharge Instructions       Discharge Instructions    Discharge instructions    Complete by:  As directed   Follow with Primary MD [name], MD in 7 days   Get CBC, CMP, 2 view Chest X ray checked  by Primary MD next visit.    Activity: As tolerated with Full fall precautions use walker/cane & assistance as needed   Disposition SNF   Diet:   Renal Low Carb  - Check your Weight same time everyday, if you gain over 2 pounds, or you  develop in leg swelling, experience more shortness of breath or chest pain, call your Primary MD immediately. Follow Cardiac Low Salt Diet and 1.2 lit/day fluid restriction.  Accuchecks 4 times/day, Once in AM empty stomach and then before each meal. Log in all results and show them to your Prim.MD in 3 days. If any glucose reading is under 80 or above 300 call your Prim MD immidiately. Follow Low glucose instructions for glucose under 80 as instructed.  On your next visit with your primary care physician please Get Medicines reviewed and adjusted.   Please request your Prim.MD to go over all Hospital Tests and Procedure/Radiological results at the follow up, please get all Hospital records sent to your Prim MD by signing hospital release before you go home.   If you experience worsening of your admission symptoms, develop shortness of breath, life threatening emergency, suicidal or homicidal thoughts you must seek medical attention immediately by calling 911 or calling your MD immediately  if symptoms less severe.  You Must read complete instructions/literature along with all the possible adverse reactions/side effects for all the Medicines you take and that have been prescribed to you. Take any new Medicines after you have completely understood and accpet all the possible adverse reactions/side effects.   Do not drive, operate heavy machinery, perform activities at heights, swimming or participation in water activities or provide baby sitting services if your were admitted for syncope or siezures until you have seen by Primary MD or a Neurologist and advised to do so again.  Do not drive when taking Pain medications.    Do not take more than prescribed Pain, Sleep and Anxiety Medications  Special Instructions: If you have smoked or chewed Tobacco  in the last 2 yrs please stop smoking, stop any regular Alcohol  and or any Recreational drug use.  Wear Seat belts while driving.   Please  note  You were cared for by a hospitalist during your hospital stay. If you have any questions about your discharge medications or the care you received while you were in the hospital after you are discharged, you can call the unit and asked to speak with the hospitalist on call if the hospitalist that took care of you is not available. Once you are discharged, your primary care physician will handle any further medical issues. Please note that NO REFILLS for any discharge medications will be authorized once you are discharged, as it is imperative that you return to your primary care physician (or establish a relationship with a primary care physician if you do not have one) for your aftercare needs so that they can reassess your need for medications and monitor your lab values.     Increase activity slowly    Complete by:  As directed              Discharge Medications       Medication List    STOP taking these medications        loperamide 2 MG capsule  Commonly known as:  IMODIUM      TAKE these medications        acetaminophen 325 MG tablet  Commonly known as:  TYLENOL  Take 650 mg by mouth every 4 (four) hours as needed for mild pain, fever or headache (DO NOT EXCEED 4 GM OF APAP IN 24 HOURS FORM ALL SOURCES).     aspirin 81 MG chewable tablet  Chew 81 mg by mouth daily.  atorvastatin 20 MG tablet  Commonly known as:  LIPITOR  Take 20 mg by mouth at bedtime.     b-complex w/C Tabs tablet  Take 1 tablet by mouth at bedtime.     docusate sodium 100 MG capsule  Commonly known as:  COLACE  Take 100 mg by mouth daily.     feeding supplement (PRO-STAT SUGAR FREE 64) Liqd  Take 30 mLs by mouth daily.     gabapentin 100 MG capsule  Commonly known as:  NEURONTIN  Take 100 mg by mouth at bedtime.     HYDROcodone-acetaminophen 10-325 MG tablet  Commonly known as:  NORCO  Take one tablet by mouth every 8 hours as needed for moderate pain. Do not exceed 4gm of Tylenol  in 24 hours     metroNIDAZOLE 500 MG tablet  Commonly known as:  FLAGYL  Take 1 tablet (500 mg total) by mouth 3 (three) times daily.     midodrine 5 MG tablet  Commonly known as:  PROAMATINE  Take 5 mg by mouth 3 (three) times daily. Do not take 6 am dose on M- W- F- d/t  dialysis days.     pantoprazole 40 MG tablet  Commonly known as:  PROTONIX  Take 1 tablet (40 mg total) by mouth daily at 6 (six) AM.     sevelamer carbonate 800 MG tablet  Commonly known as:  RENVELA  Take 800 mg by mouth daily with lunch. 12 pm and 5 pm.     sorbitol 70 % solution  Take 30 mLs by mouth daily as needed (severe constipation).     vancomycin 50 mg/mL oral solution  Commonly known as:  VANCOCIN  Take 10 mLs (500 mg total) by mouth every 6 (six) hours.     ZOFRAN 4 MG tablet  Generic drug:  ondansetron  Take 4 mg by mouth every 6 (six) hours as needed for nausea.        Major procedures and Radiology Reports - PLEASE review detailed and final reports for all details, in brief -   CT abdomen without contrast MRI abdomen without contrast EGD - GERD ulcer and duodenal bulb, esophagitis   Ct Abdomen Pelvis Wo Contrast  11/22/2015  CLINICAL DATA:  Abdominal pain beginning over the last 2 days. No vomiting. Recent diagnosis of abdominal blockage. EXAM: CT ABDOMEN AND PELVIS WITHOUT CONTRAST TECHNIQUE: Multidetector CT imaging of the abdomen and pelvis was performed following the standard protocol without IV contrast. COMPARISON:  CTs, 11/07/2015 and 05/03/2014. FINDINGS: Lung bases: Heart is mildly enlarged. There are dense coronary artery calcifications. There is patchy and linear lung base opacity most evident in the left lower lobe, likely atelectasis. Pneumonia is possible. No pulmonary edema. No pleural effusion. Hepatobiliary: Liver is unremarkable. Gallbladder is distended. There are dependent stones but no convincing wall thickening or pericholecystic fluid. No bile duct dilation. Spleen:  Mild enlargement measuring 16.5 x 6.7 x 15.4 cm. No splenic mass or focal lesion. Stable. Pancreas: Pancreas is thickened, particularly the pancreatic head, where it surrounds the celiac axis and superior mesenteric artery vasculature. Lower attenuation areas in the pancreatic tail suggest poorly defined cysts or an irregular dilated duct. Margins of the pancreas are ill-defined. This appearance is similar to the prior exam. Additional soft tissue attenuation extends along the gastrohepatic ligament consistent with poorly defined adenopathy. Largest presumed node measures 16 mm in short axis. Adrenal glands:  No masses. Kidneys, ureters, bladder: Marked renal parenchymal thinning. Numerous bilateral renal cysts.  No hydronephrosis. Ureters normal course and in caliber. Bladder is decompressed. Prostate gland:  Mild to moderately enlarged but stable. Ascites:  None. Gastrointestinal: Mild wall thickening suggested along the descending and sigmoid colon extending to the rectum. There is no bowel dilation to suggest obstruction. There are changes from a previous partial right colectomy, stable. Vascular: There are extensive atherosclerotic calcifications throughout the aorta and branch vessels. Musculoskeletal: Large subchondral cysts are noted along the superior acetabula. There is increased density in the bony structures consistent with renal osteodystrophy. There multiple Schmorl's nodes along the spine. Abdominal wall: Small fat containing left para midline abdominal wall hernia just below the umbilicus. IMPRESSION: 1. Abnormal appearance of the pancreas. An infiltrating pancreatic neoplasm is suspected. This could be better defined with pancreatic MRI with and without contrast if the patient can tolerate that procedure. There is evidence of gastrohepatic ligament chain adenopathy, extending along the porta hepatis. 2. Mild wall thickening of the descending colon, sigmoid colon and rectum consistent with a mild  infectious or inflammatory colitis. No evidence of bowel obstruction. 3. Stable splenomegaly. 4. Distended gallbladder with dependent gallstones but no convincing acute cholecystitis. 5. Significant renal parenchymal thinning with multiple cysts with acquired renal cystic disease. 6. Increased opacity the base of the left lower lobe, most likely atelectasis. Consider pneumonia in the proper clinical setting. Electronically Signed   By: Lajean Manes M.D.   On: 11/22/2015 19:46   Ct Abdomen Pelvis Wo Contrast  11/07/2015  CLINICAL DATA:  Abdominal pain, elevated lipase. Right upper quadrant pain. EXAM: CT ABDOMEN AND PELVIS WITHOUT CONTRAST TECHNIQUE: Multidetector CT imaging of the abdomen and pelvis was performed following the standard protocol without IV contrast. COMPARISON:  Ultrasound earlier this day.  CT 05/06/2014 FINDINGS: Lower chest: Extensive coronary artery calcifications. Calcifications at the lung hila, partially included. Probable bilateral calcified hilar lymph nodes, suboptimally assessed. Atelectasis at the lung bases. Liver: No evidence of focal lesion allowing for lack contrast. Hepatobiliary: Distended gallbladder containing dependent gallstones. Previously assessed by ultrasound. Pancreas: Suboptimally assessed without contrast, however there is diffuse soft tissue fullness. Mild peripancreatic edema. Spleen: Enlarged measuring 15.4 cm craniocaudal. Adrenal glands: Fullness bilaterally.  No discrete nodule. Kidneys: Atrophic parenchyma. Multiple bilateral renal cysts, with a large parapelvic cyst on the left. Some of the cysts appear complicated. Extensive renovascular calcifications. Stomach/Bowel: Stomach distended with ingested contents. Dilated fluid-filled proximal small bowel with transition in the mid right abdomen image 55 series 2. There is mesenteric edema and free fluid. No pneumatosis. More distal small bowel loops are decompressed. Enteric sutures noted at the base of the  cecum. There is cecal and ascending colonic wall thickening. Small volume of stool throughout the remainder the colon. Vascular/Lymphatic: Dense atherosclerosis of the abdominal aorta. No aneurysm. No definite retroperitoneal adenopathy. Reproductive: Prominent sized prostate gland causing mass effect on the bladder base. Bladder: Decompressed. Other: Small volume of free fluid in the abdomen and pelvis. No evidence of free air. Postsurgical change in the anterior abdominal wall with small fat containing periumbilical ventral abdominal wall hernia. Musculoskeletal: There are no acute or suspicious osseous abnormalities. 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 Bony fusion of the sacroiliac joints. Degenerative change throughout spine. IMPRESSION: 1. Small-bowel obstruction with transition point in the right mid abdomen. 2. Soft tissue fullness about the entire pancreas. There is mild peripancreatic edema, however diffuse mesenteric edema is also seen. Underlying pancreatic mass cannot be excluded. If patient is able tolerate breath hold technique, MRI may be of value.  3. Splenomegaly. 4. Cholelithiasis, please reference same-day right upper quadrant ultrasound. 5. Advanced atherosclerosis. 6. Additional chronic findings as described. Electronically Signed   By: Jeb Levering M.D.   On: 11/07/2015 05:55   Mr Abdomen Wo Contrast  11/25/2015  CLINICAL DATA:  Evaluate pancreas mass EXAM: MRI ABDOMEN WITHOUT CONTRAST TECHNIQUE: Multiplanar multisequence MR imaging was performed without the administration of intravenous contrast. COMPARISON:  11/22/2015 FINDINGS: Exam detail diminished due to motion artifact as well as lack of IV contrast material. Lower chest:  No pleural fluid identified. Hepatobiliary: Abnormal signal of the liver in spleen are noted suggestive of hemochromatosis. No mass identified. Normal appearance of the gallbladder. No biliary dilatation identified. Pancreas: There is  abnormal dilatation of the pancreatic duct from the body through the tail. Within the head and neck of pancreas there is a and ill defined area of soft tissue infiltration which is difficult to assess due to lack of IV contrast material. This measures 5.6 cm transverse 2.9 cm AP and 5.9 cm in length. Spleen: The spleen appears enlarged measuring 16 cm in length. No focal splenic abnormality identified. Adrenals/Urinary Tract: The adrenal glands are normal. Changes of bilateral polycystic kidneys identified. Stomach/Bowel: The stomach appears normal. No pathologic dilatation of the upper abdominal bowel loops. Vascular/Lymphatic: Aortic atherosclerosis noted. No aneurysm. The portal vein is not visualized and is presumably occluded by mass. They SMA and celiac arteries have a common origin and both appear partially encased by this mass.No retroperitoneal adenopathy identified peripancreatic lymph node is enlarged measuring 1.8 cm, image 47 of series 11. Other: No significant free fluid or abnormal fluid collections identified. Musculoskeletal: There is diffuse abnormal signal throughout the bone marrow suggestive of iron overload. IMPRESSION: 1. Poorly defined mass involving the head of pancreas and obstructing the pancreatic duct is again noted. Suspicious for pancreatic adenocarcinoma. This is difficult to further characterize above what was seen on CT from 11/22/2015. This is largely a reflection of lack of IV contrast material (due to chronic kidney disease) and motion artifact. There appears to be involvement of the celiac and SMA arteries and occlusion of the portal vein. Assuming no no allergy to IV contrast material then further assessment of this lesion with pancreas protocol CT of the abdomen would be advised followed by dialysis. 2. Abnormal signal within the bone marrow, liver and spleen is suggestive of hemochromatosis. 3. Aortic atherosclerosis 4. Bilateral end-stage polycystic kidneys. Electronically  Signed   By: Kerby Moors M.D.   On: 11/25/2015 11:29   Dg Chest Port 1 View  11/22/2015  CLINICAL DATA:  Patient comes from [facility] for post CVA post MI. Patient started having Diarrhea since Friday. Patient started having Abd pain and fever 103.1 . Hx of diabetes, stroke, HTN. EXAM: PORTABLE CHEST 1 VIEW COMPARISON:  05/03/2014 FINDINGS: Nasogastric tube has been removed. Heart is mildly enlarged. Film is made with shallow lung inflation. There is linear density and adjacent lucency at the right lung base, likely related to linear atelectasis. However it is difficult to exclude free intraperitoneal air. There is focal atelectasis or early infiltrate at the left lung base. Mild pulmonary edema is noted. IMPRESSION: 1. Question of free intraperitoneal air beneath the right hemidiaphragm. Further evaluation with left lateral decubitus view of the abdomen or CT of the abdomen and pelvis is recommended. 2. Bibasilar atelectasis or infiltrates, left greater than right. 3. Mild interstitial pulmonary edema. Electronically Signed   By: Nolon Nations M.D.   On: 11/22/2015 17:40  Dg Abd 2 Views  11/08/2015  CLINICAL DATA:  Small-bowel obstruction EXAM: ABDOMEN - 2 VIEW COMPARISON:  CT abdomen 11/07/2015 FINDINGS: Mildly dilated small bowel loops with air-fluid levels. Gas in the transverse colon with air-fluid levels. Upright view does not include the right hemidiaphragm. This would need to be repeated if there is concern of free air. IMPRESSION: Air-fluid levels in large and small bowel may be related to ileus or resolving small bowel obstruction Right hemidiaphragm not included on the upright view. If there is concern of free air, repeat upright view could be performed. Electronically Signed   By: Franchot Gallo M.D.   On: 11/08/2015 15:01   Dg Abd Portable 2v  11/22/2015  CLINICAL DATA:  Diarrhea since Friday with history previous small-bowel obstruction. EXAM: PORTABLE ABDOMEN - 2 VIEW COMPARISON:   X-ray 11/08/2015.  CT scan 10/30/2015. FINDINGS: Right-side-up decubitus film shows no evidence for intraperitoneal free air. Diffuse gaseous small bowel distention is noted. There is some high attenuation which appears to be in small bowel loops over the central abdomen, nonspecific. Given the central clustering of small bowel , ventral hernia would be a consideration. Bones are diffusely demineralized. , IMPRESSION: Diffuse gaseous bowel distention without evidence for intraperitoneal free air. Ileus or small-bowel obstruction could have this appearance. Central clustering of small bowel raises the question for bowel herniation into a ventral hernia which was seen on the previous CT scan. Electronically Signed   By: Misty Stanley M.D.   On: 11/22/2015 19:03   Mr Lambert Mody Cm/mrcp  11/28/2015  CLINICAL DATA:  Patient with pancreatic head mass suspicious for adenocarcinoma. EXAM: MRI ABDOMEN WITHOUT CONTRAST  (INCLUDING MRCP) TECHNIQUE: Multiplanar multisequence MR imaging of the abdomen was performed. Heavily T2-weighted images of the biliary and pancreatic ducts were obtained, and three-dimensional MRCP images were rendered by post processing. COMPARISON:  MRI 11/25/2015; CT abdomen pelvis 11/22/2015. FINDINGS: Examination limited secondary to motion artifact and lack of intravenous contrast material. Lower chest:  No large pleural effusion. Hepatobiliary: Diffuse abnormal signal of the liver suggestive of hemochromatosis. Gallbladder is distended. Layering stones/sludge within the gallbladder lumen. No definite intrahepatic biliary ductal dilatation identified. Pancreas: Re- demonstrated dilatation of the main pancreatic duct throughout the body and tail measuring up to 11 mm. Markedly limited evaluation of the pancreas due to extensive motion artifact and lack of IV contrast. Suggestion of abnormal irregular soft tissue within the region of the pancreatic head. This is difficult to measure given the above  described limitations. Spleen: Enlarged measuring approximately 16 cm. Adrenals/Urinary Tract: Normal adrenal glands. Bilateral polycystic kidneys. Stomach/Bowel: No evidence for bowel obstruction. Vascular/Lymphatic: Normal caliber abdominal aorta. No definite retroperitoneal adenopathy. The portal vein is not visualized and appears to be occluded from the suspected pancreatic head mass. Re- demonstrated enlarged 1.6 cm peripancreatic lymph node. Other: No significant free fluid in the abdomen. Musculoskeletal: Abnormal signal throughout the bone marrow suggestive of iron overload. IMPRESSION: Markedly limited examination secondary to extensive motion artifact and lack of intravenous contrast material. There is suggestion of a poorly defined mass involving the head of the pancreas with obstruction of the pancreatic duct suspicious for pancreatic adenocarcinoma. Consider dedicated evaluation with pancreatic protocol CT if the patient is on hemodialysis and able to receive intravenous contrast material. Abnormal signal in the bone marrow, liver and spleen suggestive of hemochromatosis. Polycystic kidney disease. Electronically Signed   By: Lovey Newcomer M.D.   On: 11/28/2015 07:55   US Abdomen Limited Ruq  11/07/2015  CLINICAL DATA:  Chronic periumbilical pain for 1 month. Initial encounter. EXAM: US ABDOMEN LIMITED - RIGHT UPPER QUADRANT COMPARISON:  CT of the abdomen and pelvis from 05/03/2014 FINDINGS: Gallbladder: Stones and sludge are noted dependently within the gallbladder. The gallbladder is otherwise unremarkable. No significant gallbladder wall thickening or pericholecystic fluid is seen. No ultrasonographic Murphy's sign is elicited. Common bile duct: Diameter: 0.6 cm, within normal limits in caliber. Liver: No focal lesion identified. Within normal limits in parenchymal echogenicity. IMPRESSION: 1. No acute abnormality seen at the right upper quadrant. 2. Stones and sludge seen dependently within the  gallbladder. Gallbladder otherwise unremarkable in appearance. Electronically Signed   By: Garald Balding M.D.   On: 11/07/2015 01:20    Micro Results     Recent Results (from the past 240 hour(s))  Blood Culture (routine x 2)     Status: None   Collection Time: 11/22/15  7:50 PM  Result Value Ref Range Status   Specimen Description BLOOD RIGHT ARM  Final   Special Requests BOTTLES DRAWN AEROBIC AND ANAEROBIC 5CC  Final   Culture NO GROWTH 5 DAYS  Final   Report Status 11/27/2015 FINAL  Final  Blood Culture (routine x 2)     Status: None   Collection Time: 11/22/15  7:55 PM  Result Value Ref Range Status   Specimen Description BLOOD RIGHT HAND  Final   Special Requests BOTTLES DRAWN AEROBIC AND ANAEROBIC 5CC  Final   Culture NO GROWTH 5 DAYS  Final   Report Status 11/27/2015 FINAL  Final  C difficile quick scan w PCR reflex     Status: Abnormal   Collection Time: 11/22/15 11:27 PM  Result Value Ref Range Status   C Diff antigen POSITIVE (A) NEGATIVE Final   C Diff toxin POSITIVE (A) NEGATIVE Final   C Diff interpretation Positive for toxigenic C. difficile  Final    Comment: CRITICAL RESULT CALLED TO, READ BACK BY AND VERIFIED WITH: B OLDAND,RN @0100  11/23/15 MKELLY   Gastrointestinal Panel by PCR , Stool     Status: None   Collection Time: 11/22/15 11:27 PM  Result Value Ref Range Status   Campylobacter species NOT DETECTED NOT DETECTED Final   Plesimonas shigelloides NOT DETECTED NOT DETECTED Final   Salmonella species NOT DETECTED NOT DETECTED Final   Yersinia enterocolitica NOT DETECTED NOT DETECTED Final   Vibrio species NOT DETECTED NOT DETECTED Final   Vibrio cholerae NOT DETECTED NOT DETECTED Final   Enteroaggregative E coli (EAEC) NOT DETECTED NOT DETECTED Final   Enteropathogenic E coli (EPEC) NOT DETECTED NOT DETECTED Final   Enterotoxigenic E coli (ETEC) NOT DETECTED NOT DETECTED Final   Shiga like toxin producing E coli (STEC) NOT DETECTED NOT DETECTED Final    E. coli O157 NOT DETECTED NOT DETECTED Final   Shigella/Enteroinvasive E coli (EIEC) NOT DETECTED NOT DETECTED Final   Cryptosporidium NOT DETECTED NOT DETECTED Final   Cyclospora cayetanensis NOT DETECTED NOT DETECTED Final   Entamoeba histolytica NOT DETECTED NOT DETECTED Final   Giardia lamblia NOT DETECTED NOT DETECTED Final   Adenovirus F40/41 NOT DETECTED NOT DETECTED Final   Astrovirus NOT DETECTED NOT DETECTED Final   Norovirus GI/GII NOT DETECTED NOT DETECTED Final   Rotavirus A NOT DETECTED NOT DETECTED Final   Sapovirus (I, II, IV, and V) NOT DETECTED NOT DETECTED Final  MRSA PCR Screening     Status: None   Collection Time: 11/23/15  6:22 AM  Result Value Ref Range Status  MRSA by PCR NEGATIVE NEGATIVE Final    Comment:        The GeneXpert MRSA Assay (FDA approved for NASAL specimens only), is one component of a comprehensive MRSA colonization surveillance program. It is not intended to diagnose MRSA infection nor to guide or monitor treatment for MRSA infections.        Today   Subjective    Sean [name] today has no headache,no chest abdominal pain,no new weakness tingling or numbness, feels much better wants to go home today.    Objective   Blood pressure 139/61, pulse 85, temperature 98.7 F (37.1 C), temperature source Oral, resp. rate 18, height 5\' 5"  (1.651 m), weight 72.2 kg (159 lb 2.8 oz), SpO2 99 %.   Intake/Output Summary (Last 24 hours) at 11/30/15 1105 Last data filed at 11/30/15 0915  Gross per 24 hour  Intake    350 ml  Output    500 ml  Net   -150 ml    Exam Awake Alert, Oriented x 2, No new F.N deficits, Normal affect [place].AT,PERRAL Supple Neck,No JVD, No cervical lymphadenopathy appriciated.  Symmetrical Chest wall movement, Good air movement bilaterally, CTAB RRR,No Gallops,Rubs or new Murmurs, No Parasternal Heave +ve B.Sounds, Abd Soft, Non tender, No organomegaly appriciated, No rebound -guarding or rigidity. No  Cyanosis, Clubbing or edema, No new Rash or bruise, L AKA   Data Review   CBC w Diff: Lab Results  Component Value Date   WBC 7.2 11/30/2015   WBC 4.3 10/07/2014   HGB 8.7* 11/30/2015   HCT 26.9* 11/30/2015   PLT 110* 11/30/2015   LYMPHOPCT 6 11/22/2015   MONOPCT 15 11/22/2015   EOSPCT 0 11/22/2015   BASOPCT 0 11/22/2015    CMP: Lab Results  Component Value Date   NA 135 11/30/2015   NA 137 09/01/2015   K 3.4* 11/30/2015   CL 102 11/30/2015   CO2 21* 11/30/2015   BUN 26* 11/30/2015   BUN 55* 08/18/2015   CREATININE 5.54* 11/30/2015   CREATININE 6.5* 09/01/2015   GLU 197 09/01/2015   PROT 5.9* 11/29/2015   ALBUMIN 2.5* 11/30/2015   BILITOT 0.8 11/29/2015   ALKPHOS 103 11/29/2015   AST 41 11/29/2015   ALT 16* 11/29/2015  .   Total Time in preparing paper work, data evaluation and todays exam - 35 minutes  Thurnell Lose M.D on 11/30/2015 at 11:05 AM  [facility]   Office  [phone]

## 2015-11-30 NOTE — Progress Notes (Signed)
Physical Therapy Treatment Patient Details Name: Sean [name] MRN: [id number] DOB: January 06, 1936 Today's Date: 11/30/2015    History of Present Illness HPI: Sean [name] is a 80 y.o. male with medical history significant of ischemic colitis s/p bowel resection, cholelithiasis, gallstone pancreatitis, diet controlled DM2, ESRD on Hd (MWF), caciphylaxis, s/p Left AKA, hypotension, HLD, anemia, depression with anxiety, blind Rt eye; who presents with diarrhea and abdominal pain and fever. positive CDiff colitis; developed GI bleed, underwent EGD    PT Comments    Sean [name] continue sto be quite motivated and is making progress with functional mobility and activity tolerance; Overall progressing well; Anticipate continuing good progress at post-acute rehabilitation.   Follow Up Recommendations  SNF     Equipment Recommendations  None recommended by PT    Recommendations for Other Services       Precautions / Restrictions Precautions Precautions: Fall Precaution Comments: blind Rt eye Restrictions Weight Bearing Restrictions: No    Mobility  Bed Mobility Overal bed mobility: Modified Independent                Transfers Overall transfer level: Needs assistance Equipment used: Rolling walker (2 wheeled) Transfers: Sit to/from Stand Sit to Stand: Min guard;+2 safety/equipment         General transfer comment: Pt requests no gait belt while standing to get further into prosthesis; proper use of RW and steady; minguard for safety  Ambulation/Gait Ambulation/Gait assistance: Min guard;+2 safety/equipment Ambulation Distance (Feet): 20 Feet Assistive device: Rolling walker (2 wheeled) Gait Pattern/deviations: Step-through pattern;Decreased step length - right;Decreased step length - left Gait velocity: very slow   General Gait Details: used hospital socks on prosthesis and Rt foot b/c pt did not have his Rt shoe here and wanted to be "balanced" This made his  prosthetic foot slightly less stable/able to slide more easily however pt was able to control. Pt did allow gait belt donned once ready to walk; Cues to self-monitor for activity tolerance; quite fatigued at the end of the walk   Stairs            Wheelchair Mobility    Modified Rankin (Stroke Patients Only)       Balance     Sitting balance-Leahy Scale: Good       Standing balance-Leahy Scale: Poor                      Cognition Arousal/Alertness: Awake/alert Behavior During Therapy: WFL for tasks assessed/performed Overall Cognitive Status: Within Functional Limits for tasks assessed                      Exercises      General Comments        Pertinent Vitals/Pain Pain Assessment: No/denies pain    Home Living                      Prior Function            PT Goals (current goals can now be found in the care plan section) Acute Rehab PT Goals Patient Stated Goal: not stated PT Goal Formulation: With patient Time For Goal Achievement: 12/07/15 Potential to Achieve Goals: Good Progress towards PT goals: Progressing toward goals    Frequency  Min 3X/week    PT Plan Current plan remains appropriate    Co-evaluation             End of Session Equipment Utilized  During Treatment: Gait belt Activity Tolerance: Patient tolerated treatment well Patient left: in chair;with call bell/phone within reach;with chair alarm set;Other (comment) (Cammie Sickle, NT in room working to get telemetry working)     Time: [phone] PT Time Calculation (min) (ACUTE ONLY): 36 min  Charges:  $Gait Training: 8-22 mins $Therapeutic Activity: 8-22 mins                    G Codes:      [name] 11/30/2015, 11:35 AM  Roney Marion, [place] Pager [phone] Office [phone]

## 2015-11-30 NOTE — Progress Notes (Signed)
Occupational Therapy Treatment Patient Details Name: Sean [name] MRN: [id number] DOB: [birth year] Today's Date: 11/30/2015    History of present illness HPI: LAWRANCE WIEDEMANN is a 80 y.o. male with medical history significant of ischemic colitis s/p bowel resection, cholelithiasis, gallstone pancreatitis, diet controlled DM2, ESRD on Hd (MWF), caciphylaxis, s/p Left AKA, hypotension, HLD, anemia, depression with anxiety, blind Rt eye; who presents with diarrhea and abdominal pain and fever. positive CDiff colitis; developed GI bleed, underwent EGD   OT comments  Pt progressing. Feel pt will continue to benefit from acute OT to increase independence.   Follow Up Recommendations  SNF;Supervision/Assistance - 24 hour    Equipment Recommendations  Other (comment) (TBD)    Recommendations for Other Services      Precautions / Restrictions Precautions Precautions: Fall Precaution Comments: blind Rt eye Restrictions Weight Bearing Restrictions: No       Mobility Bed Mobility Overal bed mobility: Modified Independent                                   Balance      no LOB sitting EOB while performing functional ADL tasks.                              ADL Overall ADL's : Needs assistance/impaired     Grooming: Wash/dry face;Oral care;Applying deodorant;Set up;Supervision/safety;Sitting   Upper Body Bathing: Set up;Supervision/ safety;Sitting (sitting EOB)   Lower Body Bathing: Set up;Bed level Lower Body Bathing Details (indicate cue type and reason): washed bottom and peri area Upper Body Dressing : Minimal assistance;Sitting                            Vision                     Perception     Praxis      Cognition  Awake/Alert Behavior During Therapy: WFL for tasks assessed/performed Overall Cognitive Status: No family/caregiver present to determine baseline cognitive functioning                        Extremity/Trunk Assessment               Exercises     Shoulder Instructions       General Comments      Pertinent Vitals/ Pain       Pain Assessment: No/denies pain  Home Living                                          Prior Functioning/Environment              Frequency Min 2X/week     Progress Toward Goals  OT Goals(current goals can now be found in the care plan section)  Progress towards OT goals: Progressing toward goals-updated goal and met a goal  Acute Rehab OT Goals Patient Stated Goal: not stated OT Goal Formulation: With patient Time For Goal Achievement: 12/07/15 Potential to Achieve Goals: Good ADL Goals Pt Will Perform Grooming: standing;with min guard assist Pt Will Perform Upper Body Bathing: with set-up;with supervision;sitting-goal met Pt Will Perform Upper Body Dressing: with set-up;with supervision;sitting Pt Will Transfer to Toilet:  with min assist;stand pivot transfer;bedside commode Pt/caregiver will Perform Home Exercise Program: Increased strength;Both right and left upper extremity;With theraputty;With theraband;With Supervision;With written HEP provided  Plan Discharge plan remains appropriate    Co-evaluation                 End of Session     Activity Tolerance Patient tolerated treatment well   Patient Left in bed;with call bell/phone within reach;with bed alarm set;Other (comment) (with SCD reapplied)   Nurse Communication          Time: 8478-4128 OT Time Calculation (min): 18 min  Charges: OT General Charges $OT Visit: 1 Procedure OT Treatments $Self Care/Home Management : 8-22 mins  Benito Mccreedy  OTR/L [phone]  11/30/2015, 11:26 AM

## 2015-11-30 NOTE — Progress Notes (Signed)
          Daily Rounding Note  11/30/2015, 12:02 PM  LOS: 8 days   SUBJECTIVE:       2 or 3 soft or loose stools.  Non-severe discomfort in LUQ.  Not very hungry but tolerating solids.   OBJECTIVE:         Vital signs in last 24 hours:    Temp:  [98.1 F (36.7 C)-99.2 F (37.3 C)] 98.7 F (37.1 C) (06/20 0744) Pulse Rate:  [80-105] 85 (06/20 0744) Resp:  [18-20] 18 (06/20 0744) BP: (106-139)/(48-68) 139/61 mmHg (06/20 0744) SpO2:  [98 %-100 %] 99 % (06/20 0744) Weight:  [72.2 kg (159 lb 2.8 oz)] 72.2 kg (159 lb 2.8 oz) (06/19 1524) Last BM Date: 11/30/15 Filed Weights   11/29/15 0300 11/29/15 1050 11/29/15 1524  Weight: 72 kg (158 lb 11.7 oz) 72.2 kg (159 lb 2.8 oz) 72.2 kg (159 lb 2.8 oz)   General: looks chronically unwell, comfortable   Heart: RRR Chest: clear bil Abdomen: soft, minor LUQ tenderness.  Active BS  Extremities: amputation aka on left.  No swelling on right LE Neuro/Psych:  Oriented x 3.  Alert.  Appropriate.   Intake/Output from previous day: 06/19 0701 - 06/20 0700 In: 875 [P.O.:360; I.V.:515] Out: 502 [Stool:2]  Intake/Output this shift: Total I/O In: 230 [P.O.:230] Out: 0   Lab Results:  Recent Labs  11/29/15 0323 11/29/15 0739 11/29/15 1828 11/30/15 0459  WBC 7.0  --   --  7.2  HGB 8.0*  8.1* 7.9* 8.7* 8.7*  HCT 25.6*  --   --  26.9*  PLT 163  --   --  110*   BMET  Recent Labs  11/29/15 0323 11/30/15 0458  NA 139 135  K 3.1* 3.4*  CL 108 102  CO2 20* 21*  GLUCOSE 174* 111*  BUN 62* 26*  CREATININE 9.01* 5.54*  CALCIUM 7.8* 7.5*   LFT  Recent Labs  11/29/15 0323 11/30/15 0458  PROT 5.9*  --   ALBUMIN 2.4* 2.5*  AST 41  --   ALT 16*  --   ALKPHOS 103  --   BILITOT 0.8  --    PT/INR No results for input(s): LABPROT, INR in the last 72 hours. Hepatitis Panel No results for input(s): HEPBSAG, HCVAB, HEPAIGM, HEPBIGM in the last 72 hours.  Studies/Results: No  results found.  ASSESMENT:   * CG emesis and melenic stool. 6/18 EGD, Dr Collene Mares: grade 1 esophagitis, non-bleeding duodenal ulcer without stigmata.   * C diff, on IV flagyl, po Vanc (6/13 began 2 week course). Colitis noted on CT. Diarrhea resolved  * Pancreatic mass and gastrohepatic and porta hepatis adenopathy, CA 19-9 has been obtained 2 times: 2918 and 2662. A 3rd CA 19-9 is also in orders  * Anemia, normocytic. Not on Epo, not on parenteral or PO iron.    *  ESRD.     PLAN   *  Dr Owens Loffler will be arranging outpt EUS of pancreatic mass for early July.  Will contact pt with specifics.   *  Complete 2 weeks total of oral Vanc.   *  Once daily PPI.  If Serum H Pylori Ab is +, will need abx for this.     Azucena Freed  11/30/2015, 12:02 PM Pager: [phone]

## 2015-11-30 NOTE — Discharge Instructions (Signed)
Follow with Primary MD [name], MD in 7 days   Get CBC, CMP, 2 view Chest X ray checked  by Primary MD next visit.    Activity: As tolerated with Full fall precautions use walker/cane & assistance as needed   Disposition SNF   Diet:   Renal Low Carb  - Check your Weight same time everyday, if you gain over 2 pounds, or you develop in leg swelling, experience more shortness of breath or chest pain, call your Primary MD immediately. Follow Cardiac Low Salt Diet and 1.2 lit/day fluid restriction.  Accuchecks 4 times/day, Once in AM empty stomach and then before each meal. Log in all results and show them to your Prim.MD in 3 days. If any glucose reading is under 80 or above 300 call your Prim MD immidiately. Follow Low glucose instructions for glucose under 80 as instructed.  On your next visit with your primary care physician please Get Medicines reviewed and adjusted.   Please request your Prim.MD to go over all Hospital Tests and Procedure/Radiological results at the follow up, please get all Hospital records sent to your Prim MD by signing hospital release before you go home.   If you experience worsening of your admission symptoms, develop shortness of breath, life threatening emergency, suicidal or homicidal thoughts you must seek medical attention immediately by calling 911 or calling your MD immediately  if symptoms less severe.  You Must read complete instructions/literature along with all the possible adverse reactions/side effects for all the Medicines you take and that have been prescribed to you. Take any new Medicines after you have completely understood and accpet all the possible adverse reactions/side effects.   Do not drive, operate heavy machinery, perform activities at heights, swimming or participation in water activities or provide baby sitting services if your were admitted for syncope or siezures until you have seen by Primary MD or a Neurologist and advised to  do so again.  Do not drive when taking Pain medications.    Do not take more than prescribed Pain, Sleep and Anxiety Medications  Special Instructions: If you have smoked or chewed Tobacco  in the last 2 yrs please stop smoking, stop any regular Alcohol  and or any Recreational drug use.  Wear Seat belts while driving.   Please note  You were cared for by a hospitalist during your hospital stay. If you have any questions about your discharge medications or the care you received while you were in the hospital after you are discharged, you can call the unit and asked to speak with the hospitalist on call if the hospitalist that took care of you is not available. Once you are discharged, your primary care physician will handle any further medical issues. Please note that NO REFILLS for any discharge medications will be authorized once you are discharged, as it is imperative that you return to your primary care physician (or establish a relationship with a primary care physician if you do not have one) for your aftercare needs so that they can reassess your need for medications and monitor your lab values.

## 2015-11-30 NOTE — Progress Notes (Signed)
[facility] Rounding Note  Subjective:  Tolerating  Breakfast ,"feel better than yest, ready to go"  Objective Vital signs in last 24 hours: Filed Vitals:   11/29/15 1600 11/29/15 1949 11/30/15 0615 11/30/15 0744  BP: 112/57 114/48 137/68 139/61  Pulse: 86 84 89 85  Temp: 98.1 F (36.7 C) 99.2 F (37.3 C) 98.5 F (36.9 C) 98.7 F (37.1 C)  TempSrc: Oral Oral Oral Oral  Resp: 20 18 18 18   Height:      Weight:      SpO2: 98% 99% 98% 99%   Weight change: 0.2 kg (7.1 oz)  Physical Exam: General: Pleasant, sitting up int he chair Heart: RRR, no murmur, rub or gallop Lungs: CTA Abdomen:Nondistended. + BS. Soft. Mildly tender LUQ Extremities: Trace R Pedal edema /  L prosthesis in place L FA AVG + bruit  Labs:   Recent Labs Lab 11/26/15 1236 11/29/15 0323 11/30/15 0458  NA 135 139 135  K 3.1* 3.1* 3.4*  CL 99* 108 102  CO2 22 20* 21*  GLUCOSE 197* 174* 111*  BUN 47* 62* 26*  CREATININE 7.09* 9.01* 5.54*  CALCIUM 7.9* 7.8* 7.5*  PHOS 1.5*  --  1.7*     Recent Labs Lab 11/26/15 1236 11/29/15 0323 11/30/15 0458  AST  --  41  --   ALT  --  16*  --   ALKPHOS  --  103  --   BILITOT  --  0.8  --   PROT  --  5.9*  --   ALBUMIN 2.5* 2.4* 2.5*     Recent Labs Lab 11/24/15 1838 11/26/15 1236  11/29/15 0323 11/29/15 0739 11/29/15 1828 11/30/15 0459  WBC 12.0* 9.4  --  7.0  --   --  7.2  HGB 9.9* 8.7*  < > 8.0*  8.1* 7.9* 8.7* 8.7*  HCT 31.5* 27.5*  --  25.6*  --   --  26.9*  MCV 80.6 82.1  --  82.1  --   --  83.3  PLT 192 205  --  163  --   --  PENDING  < > = values in this interval not displayed.     Recent Labs Lab 11/28/15 2137 11/29/15 0748 11/29/15 1559 11/29/15 2028 11/30/15 0742  GLUCAP 161* 126* 110* 143* 109*    Medications: . sodium chloride 500 mL (11/29/15 1639)   . sodium chloride   Intravenous Once  . atorvastatin  20 mg Oral QHS  . B-complex with vitamin C  1 tablet Oral QHS  . darbepoetin (ARANESP) injection - DIALYSIS  150 mcg  Intravenous Q Mon-HD  . feeding supplement (PRO-STAT SUGAR FREE 64)  30 mL Oral Daily  . gabapentin  100 mg Oral QHS  . insulin aspart  0-5 Units Subcutaneous QHS  . insulin aspart  0-9 Units Subcutaneous TID WC  . midodrine  5 mg Oral 3 times per day on Sun Tue Thu Sat   And  . midodrine  5 mg Oral 2 times per day on Mon Wed Fri  . pantoprazole  40 mg Oral Q0600  . sodium chloride flush  3 mL Intravenous Q12H  . vancomycin  500 mg Oral Q6H   Dialysis Access: pos bruit LL AVGG  OP Dialysis: [facility] 4h 400/500 71kg 2/2.25 bath P4 LFA AVG Hep 5000 will be no heparin at discharge post gi bleed Mircera 75 mcg IV q 4 weeks (last dose 11/12/15 HGB 10.3 11/17/15) Hectorol 3 mcg IV q tx (  last PTH 1794 10/07/15)  Problem/Plan: 1. Hematemesis/ melena - Grade 1 esophagitis by  EGD/ non-bleeding DU per . GI now has on po protonix .Tolerating soft diet reports only 3 loose stools past 24hr.  Hb 8.7 and  stable low. 2. Pancreatic mass - per MRI "poorly defined mass involving the head of pancreas and obstructing the pancreatic duct.... suspicious for pancreatic adenocarcinoma....there appears to be involvement of the celiac and SMA arteries and occlusion of the portal vein", ^^Ca 19-9; eval in progress/ as dw pt this am he desires no furhter wu for mass as noted by Pall. Care consult yesterday. He desires to continue HD and currently wants full code . See Palliative Care  full note 3. C Diff/ diarrhea: on po vanc/flagyl stopped, stools slowing down 4. ESRD MWF. Will use 4.0 k bath as op   5. Hypokalemia -  3.4 k this am / replace again  With  40 KLorCon this AM and use added k bath on op hd  [facility]  6. Anemia -  Am hgb stable low 8.7 =on Aranesp as outpt. Last dosed 6/2.was given  aranesp 157mcg yest on HD. 7. Hypotension on midodrine pre HD/ bp stable 8. 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  Hectorol just restarted ,  previous calciphylaxis but wounds healed and iPTH had been trending up.  9. Disposition - ? Home soon   Ernest Haber, PA-C [place] [phone] 11/30/2015,8:13 AM  I have seen and examined this patient and agree with plan and assessment in the above note. Diarrhea less, on po vanco for CDiff. Desires no further evaluation of panc mass (likely pancreatic cancer) and for now wants to stay on HD, remain full code. Pain is tolerable. No further GI bleeding. On po protonix and tolerating diet.  Hb stable 8's. Anticipate D/C soon. No heparin with HD for foreseeable future.   Jamal Maes, MD [facility] [phone] Pager 11/30/2015, 11:46 AM

## 2015-11-30 NOTE — Clinical Social Work Note (Signed)
Patient is medically stable for discharge back to [facility] today. Discharge information transmitted to facility and Mr. Markunas will be transported by ambulance.  Son, Brandtly Doiron [phone]) contacted and informed of discharge.   Madisun Hargrove Givens, MSW, LCSW Licensed Clinical Social Worker [place] [phone]

## 2015-12-01 ENCOUNTER — Encounter: Payer: Self-pay | Admitting: Internal Medicine

## 2015-12-01 ENCOUNTER — Non-Acute Institutional Stay (SKILLED_NURSING_FACILITY): Payer: Medicare Other | Admitting: Internal Medicine

## 2015-12-01 ENCOUNTER — Other Ambulatory Visit: Payer: Self-pay | Admitting: *Deleted

## 2015-12-01 DIAGNOSIS — R531 Weakness: Secondary | ICD-10-CM

## 2015-12-01 DIAGNOSIS — E785 Hyperlipidemia, unspecified: Secondary | ICD-10-CM

## 2015-12-01 DIAGNOSIS — B9681 Helicobacter pylori [H. pylori] as the cause of diseases classified elsewhere: Secondary | ICD-10-CM

## 2015-12-01 DIAGNOSIS — G546 Phantom limb syndrome with pain: Secondary | ICD-10-CM

## 2015-12-01 DIAGNOSIS — K851 Biliary acute pancreatitis without necrosis or infection: Secondary | ICD-10-CM | POA: Diagnosis not present

## 2015-12-01 DIAGNOSIS — R52 Pain, unspecified: Secondary | ICD-10-CM

## 2015-12-01 DIAGNOSIS — I739 Peripheral vascular disease, unspecified: Secondary | ICD-10-CM | POA: Diagnosis not present

## 2015-12-01 DIAGNOSIS — N186 End stage renal disease: Secondary | ICD-10-CM

## 2015-12-01 DIAGNOSIS — Z992 Dependence on renal dialysis: Secondary | ICD-10-CM

## 2015-12-01 DIAGNOSIS — A048 Other specified bacterial intestinal infections: Secondary | ICD-10-CM

## 2015-12-01 DIAGNOSIS — I953 Hypotension of hemodialysis: Secondary | ICD-10-CM | POA: Diagnosis not present

## 2015-12-01 DIAGNOSIS — D62 Acute posthemorrhagic anemia: Secondary | ICD-10-CM | POA: Diagnosis not present

## 2015-12-01 DIAGNOSIS — K869 Disease of pancreas, unspecified: Secondary | ICD-10-CM

## 2015-12-01 DIAGNOSIS — K269 Duodenal ulcer, unspecified as acute or chronic, without hemorrhage or perforation: Secondary | ICD-10-CM

## 2015-12-01 DIAGNOSIS — A047 Enterocolitis due to Clostridium difficile: Secondary | ICD-10-CM

## 2015-12-01 DIAGNOSIS — I2510 Atherosclerotic heart disease of native coronary artery without angina pectoris: Secondary | ICD-10-CM | POA: Diagnosis not present

## 2015-12-01 DIAGNOSIS — K8689 Other specified diseases of pancreas: Secondary | ICD-10-CM

## 2015-12-01 DIAGNOSIS — A0472 Enterocolitis due to Clostridium difficile, not specified as recurrent: Secondary | ICD-10-CM

## 2015-12-01 LAB — H. PYLORI ANTIBODY, IGG: H PYLORI IGG: 2.8 U/mL — AB (ref 0.0–0.8)

## 2015-12-01 MED ORDER — HYDROCODONE-ACETAMINOPHEN 10-325 MG PO TABS: ORAL_TABLET | ORAL | Status: DC

## 2015-12-01 NOTE — Telephone Encounter (Signed)
[facility] 

## 2015-12-01 NOTE — Progress Notes (Addendum)
LOCATION: [name]  PCP: [name], MD   Code Status: Full Code  Goals of care: Advanced Directive information Advanced Directives 11/30/2015  Does patient have an advance directive? No  Would patient like information on creating an advanced directive? Yes - Scientist, clinical (histocompatibility and immunogenetics) given       Extended Emergency Contact Information Primary Emergency Contact: [facility] Address: [address]          [place], [place] [address] [name] of [place] Phone: [phone] Relation: Daughter Secondary Emergency Contact: [facility] Address: [address]          [place], [place] [address] [name] of [employer] Phone: [phone] Relation: Son   Allergies  Allergen Reactions  . Benazepril Hcl Hives, Itching and Rash    flareups on patient head    Chief Complaint  Patient presents with  . Readmit To SNF    Readmission     HPI:  Patient is a 80 y.o. male seen today for long term care post hospital re-admission from 11/22/15-11/30/15 with sepsis with c.diff colitis, acute cholecystitis, upper gi bleed with duodenal ulcer and pancreatic mass suspicious for pancreatic adenocarcinoma. He was started on antibiotics for c.diff colitis. He required transfusion for blood loss anemia from gi bleed. He underwent EGD 11/28/15. His surgery was deferred for later date. He was seen by palliative care as he refused CT scan with pancreatic protocol. He is seen in his room today. He had his dialysis this am. He was in the hospital from 11/06/15-11/10/15 with acute abdominal pain with gallstone pancreatitis and small bowel obstruction with ileus as well.   Review of Systems:  Constitutional: Negative for fever, chills, diaphoresis. Feels weak and tired.  HENT: Negative for headache, congestion, nasal discharge, difficulty swallowing.   Eyes: Negative for blurred vision, double vision and discharge.  Respiratory: Negative for cough, shortness of breath and  wheezing.   Cardiovascular: Negative for chest pain, palpitations, leg swelling.  Gastrointestinal: Negative for heartburn, vomiting, abdominal pain. Positive for loss of appetite. Last bowel movement today. Semi-formed stool and no melena or rectal bleed.   Genitourinary: has anuria  Musculoskeletal: Negative for back pain, fall in the facility.  Skin: Negative for itching, rash.  Neurological: Negative for dizziness. Psychiatric/Behavioral: Negative for depression   Past Medical History  Diagnosis Date  . Hyperparathyroidism   . Arthritis   . Hyperlipidemia   . Hypertension   . Stroke ([place])   . GERD (gastroesophageal reflux disease)   . Migraine   . Heart attack ([place]) 2011; 1990's  . Peripheral neuropathy ([place])   . Depression with anxiety   . Dialysis patient [facility])     M, W, F; [place] CAD (coronary artery disease)     a. s/p NSTEMI and failed RCA PCI in 2011. b. Nuc 12/2013: nonischemic, prior inferior MI.  Marland Kitchen LBBB (left bundle branch block)   . Calciphylaxis     a. Multiple admissions for nonhealing L ulcer/sepsis, s/p L AKA 02/02/2014.  Marland Kitchen Chronic anemia   . Dementia   . ESRD (end stage renal disease) on dialysis ([place])   . Renal insufficiency   . Hypotension     renovascular  . History of left bundle branch block   . Type II diabetes mellitus ([place])     not taking any medications.  . Cholelithiasis 01/2014    acute cholecystitis managed with perc drain due to high surgical risk.    Past Surgical History  Procedure Laterality  Date  . Parathyroidectomy  2011  . Capd insertion  09/12/2011    Procedure: LAPAROSCOPIC INSERTION CONTINUOUS AMBULATORY PERITONEAL DIALYSIS  (CAPD) CATHETER;  Surgeon: Edward Jolly, MD;  Location: [place];  Service: General;  Laterality: N/A;  . Sp av dialysis shunt access existing *l*  2003  . Dg av dialysis shunt intro ndl*r* or      "not working" (09/19/11)  . Capd removal  03/12/2012    Procedure: CONTINUOUS AMBULATORY  PERITONEAL DIALYSIS  (CAPD) CATHETER REMOVAL;  Surgeon: Edward Jolly, MD;  Location: [place];  Service: General;  Laterality: N/A;  . Amputation Left 02/02/2014    Procedure: AMPUTATION ABOVE KNEE-LEFT;  Surgeon: Elam Dutch, MD;  Location: [place];  Service: Vascular;  Laterality: Left;  . I&d extremity Left 03/31/2014    Procedure: IRRIGATION AND DEBRIDEMENT EXTREMITY - LEFT ABOVE KNEE AMPUTATION;  Surgeon: Elam Dutch, MD;  Location: [place];  Service: Vascular;  Laterality: Left;  . Application of wound vac Left 03/31/2014    Procedure: APPLICATION OF WOUND VAC - LEFT AKA;  Surgeon: Elam Dutch, MD;  Location: [place];  Service: Vascular;  Laterality: Left;  . Colon surgery  2011    colon resection   . Colonoscopy with propofol N/A 07/14/2014    Procedure: COLONOSCOPY WITH PROPOFOL;  Surgeon: Jerene Bears, MD;  Location: [facility];  Service: Gastroenterology;  Laterality: N/A;  . Esophagogastroduodenoscopy Left 11/28/2015    Procedure: ESOPHAGOGASTRODUODENOSCOPY (EGD);  Surgeon: Juanita Craver, MD;  Location: [facility] ENDOSCOPY;  Service: Endoscopy;  Laterality: Left;   Social History:   reports that he quit smoking about 39 years ago. His smoking use included Cigarettes. He has a 1.75 pack-year smoking history. He has never used smokeless tobacco. He reports that he does not drink alcohol or use illicit drugs.  Family History  Problem Relation Age of Onset  . Cancer Sister     breast  . Cancer Brother     lung  . Anesthesia problems Neg Hx   . Hypotension Neg Hx   . Malignant hyperthermia Neg Hx   . Pseudochol deficiency Neg Hx     Medications:   Medication List       This list is accurate as of: 12/01/15  2:34 PM.  Always use your most recent med list.               acetaminophen 325 MG tablet  Commonly known as:  TYLENOL  Take 650 mg by mouth every 4 (four) hours as needed for mild pain, fever or headache (DO NOT EXCEED 4 GM OF APAP IN 24 HOURS FORM ALL SOURCES).       atorvastatin 20 MG tablet  Commonly known as:  LIPITOR  Take 20 mg by mouth at bedtime.     b-complex w/C Tabs tablet  Take 1 tablet by mouth at bedtime.     docusate sodium 100 MG capsule  Commonly known as:  COLACE  Take 100 mg by mouth daily.     feeding supplement (PRO-STAT SUGAR FREE 64) Liqd  Take 30 mLs by mouth daily.     gabapentin 100 MG capsule  Commonly known as:  NEURONTIN  Take 100 mg by mouth at bedtime.     HYDROcodone-acetaminophen 10-325 MG tablet  Commonly known as:  NORCO  Take one tablet by mouth every 8 hours as needed for moderate pain. Do not exceed 4gm of Tylenol in 24 hours     metroNIDAZOLE  500 MG tablet  Commonly known as:  FLAGYL  Take 1 tablet (500 mg total) by mouth 3 (three) times daily.     midodrine 5 MG tablet  Commonly known as:  PROAMATINE  Take 5 mg by mouth 3 (three) times daily. Do not take 6 am dose on M- W- F- d/t  dialysis days.     sevelamer carbonate 800 MG tablet  Commonly known as:  RENVELA  Take 800 mg by mouth daily with lunch. 12 pm and 5 pm.     sorbitol 70 % solution  Take 30 mLs by mouth daily as needed (severe constipation).     vancomycin 50 mg/mL oral solution  Commonly known as:  VANCOCIN  Take 10 mLs (500 mg total) by mouth every 6 (six) hours.     ZOFRAN 4 MG tablet  Generic drug:  ondansetron  Take 4 mg by mouth every 6 (six) hours as needed for nausea.        Immunizations: Immunization History  Administered Date(s) Administered  . Influenza-Unspecified 04/03/2014, 04/19/2015  . PPD Test 04/03/2014, 04/17/2014, 02/16/2015  . Pneumococcal Polysaccharide-23 02/08/2014  . Pneumococcal-Unspecified 04/03/2014     Physical Exam: Filed Vitals:   12/01/15 1422  BP: 118/68  Temp: 98 F (36.7 C)  TempSrc: Oral  Resp: 20  Height: 5\' 5"  (1.651 m)  Weight: 149 lb (67.586 kg)   Body mass index is 24.79 kg/(m^2).   General- elderly male, in no acute distress Head- normocephalic,  atraumatic Nose- no maxillary or frontal sinus tenderness, no nasal discharge Throat- moist mucus membrane, has dentures  Eyes- PERRLA, EOMI, no pallor, no icterus, no discharge, normal conjunctiva, normal sclera Neck- no cervical lymphadenopathy Cardiovascular- normal s1,s2, no murmur, trace right leg edema Respiratory- bilateral clear to auscultation, no wheeze, no rhonchi, no crackles, no use of accessory muscles Abdomen- bowel sounds present, soft, non tender Musculoskeletal- left AKA, able to move all 3 extremities Neurological- alert and oriented to person, place and time Skin- warm and dry, left arm AV fistula with good thrill, has calciphylaxis Psychiatry- normal mood and affect     Labs reviewed: Basic Metabolic Panel:  Recent Labs  11/08/15 0518  11/09/15 0547 11/10/15 0656 11/10/15 0826  11/26/15 1236 11/29/15 0323 11/30/15 0458  NA 137  < > 138 138 136  < > 135 139 135  K 4.8  < > 3.6 3.8 3.7  < > 3.1* 3.1* 3.4*  CL 99*  < > 97* 98* 98*  < > 99* 108 102  CO2 24  < > 22 26 26   < > 22 20* 21*  GLUCOSE 91  < > 60* 89 88  < > 197* 174* 111*  BUN 36*  < > 19 28* 27*  < > 47* 62* 26*  CREATININE 6.94*  < > 4.57* 6.41* 6.38*  < > 7.09* 9.01* 5.54*  CALCIUM 7.8*  < > 7.7* 7.6* 7.5*  < > 7.9* 7.8* 7.5*  MG 2.1  --  2.0 2.1  --   --   --   --   --   PHOS  --   < >  --   --  3.2  --  1.5*  --  1.7*  < > = values in this interval not displayed. Liver Function Tests:  Recent Labs  11/10/15 0656  11/16/15 11/22/15 1955 11/26/15 1236 11/29/15 0323 11/30/15 0458  AST 19  --  17 19  --  41  --  ALT 11*  --   --  10*  --  16*  --   ALKPHOS 135*  --  180* 144*  --  103  --   BILITOT 1.2  --   --  1.7*  --  0.8  --   PROT 6.7  --   --  7.2  --  5.9*  --   ALBUMIN 3.0*  < >  --  3.1* 2.5* 2.4* 2.5*  < > = values in this interval not displayed.  Recent Labs  11/06/15 2350 11/08/15 1137 11/22/15 1955  LIPASE 105* 65* 34   No results for input(s): AMMONIA in the  last 8760 hours. CBC:  Recent Labs  11/09/15 0547 11/10/15 0656 11/22/15 1955  11/26/15 1236  11/29/15 0323 11/29/15 0739 11/29/15 1828 11/30/15 0459  WBC 4.1 2.8* 9.5  < > 9.4  --  7.0  --   --  7.2  NEUTROABS 2.7 1.7 7.5  --   --   --   --   --   --   --   HGB 9.9* 9.5* 10.9*  < > 8.7*  < > 8.0*  8.1* 7.9* 8.7* 8.7*  HCT 32.3* 30.8* 34.9*  < > 27.5*  --  25.6*  --   --  26.9*  MCV 85.7 84.2 83.5  < > 82.1  --  82.1  --   --  83.3  PLT 86* 91* 119*  < > 205  --  163  --   --  110*  < > = values in this interval not displayed. Cardiac Enzymes: No results for input(s): CKTOTAL, CKMB, CKMBINDEX, TROPONINI in the last 8760 hours. BNP: Invalid input(s): POCBNP CBG:  Recent Labs  11/29/15 2028 11/30/15 0742 11/30/15 1154  GLUCAP 143* 109* 185*    Radiological Exams: Ct Abdomen Pelvis Wo Contrast  11/22/2015  CLINICAL DATA:  Abdominal pain beginning over the last 2 days. No vomiting. Recent diagnosis of abdominal blockage. EXAM: CT ABDOMEN AND PELVIS WITHOUT CONTRAST TECHNIQUE: Multidetector CT imaging of the abdomen and pelvis was performed following the standard protocol without IV contrast. COMPARISON:  CTs, 11/07/2015 and 05/03/2014. FINDINGS: Lung bases: Heart is mildly enlarged. There are dense coronary artery calcifications. There is patchy and linear lung base opacity most evident in the left lower lobe, likely atelectasis. Pneumonia is possible. No pulmonary edema. No pleural effusion. Hepatobiliary: Liver is unremarkable. Gallbladder is distended. There are dependent stones but no convincing wall thickening or pericholecystic fluid. No bile duct dilation. Spleen: Mild enlargement measuring 16.5 x 6.7 x 15.4 cm. No splenic mass or focal lesion. Stable. Pancreas: Pancreas is thickened, particularly the pancreatic head, where it surrounds the celiac axis and superior mesenteric artery vasculature. Lower attenuation areas in the pancreatic tail suggest poorly defined cysts or an  irregular dilated duct. Margins of the pancreas are ill-defined. This appearance is similar to the prior exam. Additional soft tissue attenuation extends along the gastrohepatic ligament consistent with poorly defined adenopathy. Largest presumed node measures 16 mm in short axis. Adrenal glands:  No masses. Kidneys, ureters, bladder: Marked renal parenchymal thinning. Numerous bilateral renal cysts. No hydronephrosis. Ureters normal course and in caliber. Bladder is decompressed. Prostate gland:  Mild to moderately enlarged but stable. Ascites:  None. Gastrointestinal: Mild wall thickening suggested along the descending and sigmoid colon extending to the rectum. There is no bowel dilation to suggest obstruction. There are changes from a previous partial right colectomy, stable. Vascular: There are extensive atherosclerotic calcifications  throughout the aorta and branch vessels. Musculoskeletal: Large subchondral cysts are noted along the superior acetabula. There is increased density in the bony structures consistent with renal osteodystrophy. There multiple Schmorl's nodes along the spine. Abdominal wall: Small fat containing left para midline abdominal wall hernia just below the umbilicus. IMPRESSION: 1. Abnormal appearance of the pancreas. An infiltrating pancreatic neoplasm is suspected. This could be better defined with pancreatic MRI with and without contrast if the patient can tolerate that procedure. There is evidence of gastrohepatic ligament chain adenopathy, extending along the porta hepatis. 2. Mild wall thickening of the descending colon, sigmoid colon and rectum consistent with a mild infectious or inflammatory colitis. No evidence of bowel obstruction. 3. Stable splenomegaly. 4. Distended gallbladder with dependent gallstones but no convincing acute cholecystitis. 5. Significant renal parenchymal thinning with multiple cysts with acquired renal cystic disease. 6. Increased opacity the base of the  left lower lobe, most likely atelectasis. Consider pneumonia in the proper clinical setting. Electronically Signed   By: Lajean Manes M.D.   On: 11/22/2015 19:46   Ct Abdomen Pelvis Wo Contrast  11/07/2015  CLINICAL DATA:  Abdominal pain, elevated lipase. Right upper quadrant pain. EXAM: CT ABDOMEN AND PELVIS WITHOUT CONTRAST TECHNIQUE: Multidetector CT imaging of the abdomen and pelvis was performed following the standard protocol without IV contrast. COMPARISON:  Ultrasound earlier this day.  CT 05/06/2014 FINDINGS: Lower chest: Extensive coronary artery calcifications. Calcifications at the lung hila, partially included. Probable bilateral calcified hilar lymph nodes, suboptimally assessed. Atelectasis at the lung bases. Liver: No evidence of focal lesion allowing for lack contrast. Hepatobiliary: Distended gallbladder containing dependent gallstones. Previously assessed by ultrasound. Pancreas: Suboptimally assessed without contrast, however there is diffuse soft tissue fullness. Mild peripancreatic edema. Spleen: Enlarged measuring 15.4 cm craniocaudal. Adrenal glands: Fullness bilaterally.  No discrete nodule. Kidneys: Atrophic parenchyma. Multiple bilateral renal cysts, with a large parapelvic cyst on the left. Some of the cysts appear complicated. Extensive renovascular calcifications. Stomach/Bowel: Stomach distended with ingested contents. Dilated fluid-filled proximal small bowel with transition in the mid right abdomen image 55 series 2. There is mesenteric edema and free fluid. No pneumatosis. More distal small bowel loops are decompressed. Enteric sutures noted at the base of the cecum. There is cecal and ascending colonic wall thickening. Small volume of stool throughout the remainder the colon. Vascular/Lymphatic: Dense atherosclerosis of the abdominal aorta. No aneurysm. No definite retroperitoneal adenopathy. Reproductive: Prominent sized prostate gland causing mass effect on the bladder base.  Bladder: Decompressed. Other: Small volume of free fluid in the abdomen and pelvis. No evidence of free air. Postsurgical change in the anterior abdominal wall with small fat containing periumbilical ventral abdominal wall hernia. Musculoskeletal: There are no acute or suspicious osseous abnormalities. 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 Bony fusion of the sacroiliac joints. Degenerative change throughout spine. IMPRESSION: 1. Small-bowel obstruction with transition point in the right mid abdomen. 2. Soft tissue fullness about the entire pancreas. There is mild peripancreatic edema, however diffuse mesenteric edema is also seen. Underlying pancreatic mass cannot be excluded. If patient is able tolerate breath hold technique, MRI may be of value. 3. Splenomegaly. 4. Cholelithiasis, please reference same-day right upper quadrant ultrasound. 5. Advanced atherosclerosis. 6. Additional chronic findings as described. Electronically Signed   By: Jeb Levering M.D.   On: 11/07/2015 05:55   Mr Abdomen Wo Contrast  11/25/2015  CLINICAL DATA:  Evaluate pancreas mass EXAM: MRI ABDOMEN WITHOUT CONTRAST TECHNIQUE: Multiplanar multisequence MR imaging was performed without  the administration of intravenous contrast. COMPARISON:  11/22/2015 FINDINGS: Exam detail diminished due to motion artifact as well as lack of IV contrast material. Lower chest:  No pleural fluid identified. Hepatobiliary: Abnormal signal of the liver in spleen are noted suggestive of hemochromatosis. No mass identified. Normal appearance of the gallbladder. No biliary dilatation identified. Pancreas: There is abnormal dilatation of the pancreatic duct from the body through the tail. Within the head and neck of pancreas there is a and ill defined area of soft tissue infiltration which is difficult to assess due to lack of IV contrast material. This measures 5.6 cm transverse 2.9 cm AP and 5.9 cm in length. Spleen: The spleen  appears enlarged measuring 16 cm in length. No focal splenic abnormality identified. Adrenals/Urinary Tract: The adrenal glands are normal. Changes of bilateral polycystic kidneys identified. Stomach/Bowel: The stomach appears normal. No pathologic dilatation of the upper abdominal bowel loops. Vascular/Lymphatic: Aortic atherosclerosis noted. No aneurysm. The portal vein is not visualized and is presumably occluded by mass. They SMA and celiac arteries have a common origin and both appear partially encased by this mass.No retroperitoneal adenopathy identified peripancreatic lymph node is enlarged measuring 1.8 cm, image 47 of series 11. Other: No significant free fluid or abnormal fluid collections identified. Musculoskeletal: There is diffuse abnormal signal throughout the bone marrow suggestive of iron overload. IMPRESSION: 1. Poorly defined mass involving the head of pancreas and obstructing the pancreatic duct is again noted. Suspicious for pancreatic adenocarcinoma. This is difficult to further characterize above what was seen on CT from 11/22/2015. This is largely a reflection of lack of IV contrast material (due to chronic kidney disease) and motion artifact. There appears to be involvement of the celiac and SMA arteries and occlusion of the portal vein. Assuming no no allergy to IV contrast material then further assessment of this lesion with pancreas protocol CT of the abdomen would be advised followed by dialysis. 2. Abnormal signal within the bone marrow, liver and spleen is suggestive of hemochromatosis. 3. Aortic atherosclerosis 4. Bilateral end-stage polycystic kidneys. Electronically Signed   By: Kerby Moors M.D.   On: 11/25/2015 11:29   Dg Chest Port 1 View  11/22/2015  CLINICAL DATA:  Patient comes from [facility] for post CVA post MI. Patient started having Diarrhea since Friday. Patient started having Abd pain and fever 103.1 . Hx of diabetes, stroke, HTN. EXAM: PORTABLE CHEST 1 VIEW  COMPARISON:  05/03/2014 FINDINGS: Nasogastric tube has been removed. Heart is mildly enlarged. Film is made with shallow lung inflation. There is linear density and adjacent lucency at the right lung base, likely related to linear atelectasis. However it is difficult to exclude free intraperitoneal air. There is focal atelectasis or early infiltrate at the left lung base. Mild pulmonary edema is noted. IMPRESSION: 1. Question of free intraperitoneal air beneath the right hemidiaphragm. Further evaluation with left lateral decubitus view of the abdomen or CT of the abdomen and pelvis is recommended. 2. Bibasilar atelectasis or infiltrates, left greater than right. 3. Mild interstitial pulmonary edema. Electronically Signed   By: Nolon Nations M.D.   On: 11/22/2015 17:40   Dg Abd 2 Views  11/08/2015  CLINICAL DATA:  Small-bowel obstruction EXAM: ABDOMEN - 2 VIEW COMPARISON:  CT abdomen 11/07/2015 FINDINGS: Mildly dilated small bowel loops with air-fluid levels. Gas in the transverse colon with air-fluid levels. Upright view does not include the right hemidiaphragm. This would need to be repeated if there is concern of free air. IMPRESSION:  Air-fluid levels in large and small bowel may be related to ileus or resolving small bowel obstruction Right hemidiaphragm not included on the upright view. If there is concern of free air, repeat upright view could be performed. Electronically Signed   By: Franchot Gallo M.D.   On: 11/08/2015 15:01   Dg Abd Portable 2v  11/22/2015  CLINICAL DATA:  Diarrhea since Friday with history previous small-bowel obstruction. EXAM: PORTABLE ABDOMEN - 2 VIEW COMPARISON:  X-ray 11/08/2015.  CT scan 10/30/2015. FINDINGS: Right-side-up decubitus film shows no evidence for intraperitoneal free air. Diffuse gaseous small bowel distention is noted. There is some high attenuation which appears to be in small bowel loops over the central abdomen, nonspecific. Given the central clustering of  small bowel , ventral hernia would be a consideration. Bones are diffusely demineralized. , IMPRESSION: Diffuse gaseous bowel distention without evidence for intraperitoneal free air. Ileus or small-bowel obstruction could have this appearance. Central clustering of small bowel raises the question for bowel herniation into a ventral hernia which was seen on the previous CT scan. Electronically Signed   By: Misty Stanley M.D.   On: 11/22/2015 19:03   Mr Lambert Mody Cm/mrcp  11/28/2015  CLINICAL DATA:  Patient with pancreatic head mass suspicious for adenocarcinoma. EXAM: MRI ABDOMEN WITHOUT CONTRAST  (INCLUDING MRCP) TECHNIQUE: Multiplanar multisequence MR imaging of the abdomen was performed. Heavily T2-weighted images of the biliary and pancreatic ducts were obtained, and three-dimensional MRCP images were rendered by post processing. COMPARISON:  MRI 11/25/2015; CT abdomen pelvis 11/22/2015. FINDINGS: Examination limited secondary to motion artifact and lack of intravenous contrast material. Lower chest:  No large pleural effusion. Hepatobiliary: Diffuse abnormal signal of the liver suggestive of hemochromatosis. Gallbladder is distended. Layering stones/sludge within the gallbladder lumen. No definite intrahepatic biliary ductal dilatation identified. Pancreas: Re- demonstrated dilatation of the main pancreatic duct throughout the body and tail measuring up to 11 mm. Markedly limited evaluation of the pancreas due to extensive motion artifact and lack of IV contrast. Suggestion of abnormal irregular soft tissue within the region of the pancreatic head. This is difficult to measure given the above described limitations. Spleen: Enlarged measuring approximately 16 cm. Adrenals/Urinary Tract: Normal adrenal glands. Bilateral polycystic kidneys. Stomach/Bowel: No evidence for bowel obstruction. Vascular/Lymphatic: Normal caliber abdominal aorta. No definite retroperitoneal adenopathy. The portal vein is not visualized  and appears to be occluded from the suspected pancreatic head mass. Re- demonstrated enlarged 1.6 cm peripancreatic lymph node. Other: No significant free fluid in the abdomen. Musculoskeletal: Abnormal signal throughout the bone marrow suggestive of iron overload. IMPRESSION: Markedly limited examination secondary to extensive motion artifact and lack of intravenous contrast material. There is suggestion of a poorly defined mass involving the head of the pancreas with obstruction of the pancreatic duct suspicious for pancreatic adenocarcinoma. Consider dedicated evaluation with pancreatic protocol CT if the patient is on hemodialysis and able to receive intravenous contrast material. Abnormal signal in the bone marrow, liver and spleen suggestive of hemochromatosis. Polycystic kidney disease. Electronically Signed   By: Lovey Newcomer M.D.   On: 11/28/2015 07:55   US Abdomen Limited Ruq  11/07/2015  CLINICAL DATA:  Chronic periumbilical pain for 1 month. Initial encounter. EXAM: US ABDOMEN LIMITED - RIGHT UPPER QUADRANT COMPARISON:  CT of the abdomen and pelvis from 05/03/2014 FINDINGS: Gallbladder: Stones and sludge are noted dependently within the gallbladder. The gallbladder is otherwise unremarkable. No significant gallbladder wall thickening or pericholecystic fluid is seen. No ultrasonographic Murphy's sign is elicited.  Common bile duct: Diameter: 0.6 cm, within normal limits in caliber. Liver: No focal lesion identified. Within normal limits in parenchymal echogenicity. IMPRESSION: 1. No acute abnormality seen at the right upper quadrant. 2. Stones and sludge seen dependently within the gallbladder. Gallbladder otherwise unremarkable in appearance. Electronically Signed   By: Garald Balding M.D.   On: 11/07/2015 01:20    Assessment/Plan  Generalized weakness From physical deconditioning. Will have patient work with PT/OT as tolerated to regain strength and restore function.  Fall precautions are in  place.  C.diff colitis Continue and complete course of flagyl tid and po vancomycin qid on 12/06/15. Contact precautions. D/c colace for now  Upper gi bleed With duodenal ulcer noted on ED with concern for variceal bleed. To be started on omeprazole 20 mg bid per gi rec. Has f/u with GI  H.pylori infection + h.pylori. To be started on amoxicillin 1000 mg bid x 2 weeks and clarithromycin 500 mg bid x 2 weeks with omeprazole 20 mg bid x 2 weeks, to follow with gi  Blood loss anemia S/p 1 u prbc transfusion. Has anemia of ckd. Monitor cbc  Gallstone pancreatitis Has occasional abdominal wall soreness. Denies nausea or vomiting. Plan is for possible cholecystectomy in few weeks after recovery from deconditioning. Monitor clinically  Pancreatic mass Concern for this being adenocarcinoma seen on imaging, has refused ct scan with pancreatic protocol. Get palliative consult, has gi follow uo  ESRD  On HD 3 days a week. continue renvela  CAD Chest pain free. Continue statin. Off aspirin with gi bleed  Phantom pain S/p left AKA. Controlled pain. Continue norco 10-325 mg q8h prn pain with tylenol. Continue gabapentin 100 mg daily  PAD S/p left AKA. Aspirin held with gi bleed. Continue foot and skin care to right leg  Hypotension  Associated with HD. Continue midodrine 5 mg tid. Monitor bp per facility protocol  Hyperlipidemia Lipid Panel     Component Value Date/Time   CHOL 136 01/20/2015   TRIG 30* 01/20/2015   HDL 57 01/20/2015   CHOLHDL 4.7 09/20/2011 0822   VLDL 24 09/20/2011 0822   LDLCALC 73 01/20/2015   Refuses blood work, continue statin for now.     Goals of care: long term care   Labs/tests ordered: cbc, cmp, lipid panel  Family/ staff Communication: reviewed care plan with patient and nursing supervisor    [name], MD Internal Medicine [facility], [place] [address] Cell Phone (Monday-Friday  8 am - 5 pm): [phone] On Call: [phone] and follow prompts after 5 pm and on weekends Office Phone: [phone] Office Fax: [phone]

## 2015-12-02 ENCOUNTER — Other Ambulatory Visit: Payer: Self-pay

## 2015-12-02 DIAGNOSIS — K8689 Other specified diseases of pancreas: Secondary | ICD-10-CM

## 2015-12-02 NOTE — Telephone Encounter (Signed)
EUS scheduled, York Grice notified at the Nursing facility.  Instructions were faxed to the facility attention to York Grice [phone]

## 2015-12-06 LAB — LIPID PANEL
Cholesterol: 88 mg/dL (ref 0–200)
HDL: 34 mg/dL — AB (ref 35–70)
LDL Cholesterol: 45 mg/dL
Triglycerides: 48 mg/dL (ref 40–160)

## 2015-12-06 LAB — HEPATIC FUNCTION PANEL
ALT: 9 U/L — AB (ref 10–40)
AST: 27 U/L (ref 14–40)
Alkaline Phosphatase: 109 U/L (ref 25–125)
BILIRUBIN, TOTAL: 1.1 mg/dL

## 2015-12-06 LAB — BASIC METABOLIC PANEL
GLUCOSE: 103 mg/dL
POTASSIUM: 4.5 mmol/L (ref 3.4–5.3)
SODIUM: 140 mmol/L (ref 137–147)

## 2015-12-06 LAB — CBC AND DIFFERENTIAL
HCT: 27 % — AB (ref 41–53)
HEMOGLOBIN: 8.4 g/dL — AB (ref 13.5–17.5)
Platelets: 189 10*3/uL (ref 150–399)
WBC: 5.2 10*3/mL

## 2015-12-08 NOTE — Progress Notes (Addendum)
Preop instructions for:   Sean [name]                      Date of Birth      1935/09/16                      Date of Procedure:  12-16-15    Doctor: Rande Brunt Time to arrive at [facility]: 1000 AM Report to: Admitting Procedure time:1215 -1300 Procedure: Upper Endoscopic Ultrasound Any procedure time changes, MD office will notify you!   Do not eat or drink past midnight the night before your procedure.(To include any tube feedings-must be discontinued) Reminder:Follow bowel prep instructions per MD office!   Take these morning medications only with sips of water.(or give through gastrostomy or feeding tube).  Note: No Insulin or Diabetic meds should be given or taken the morning of the procedure!   Facility contact:Nursing Supervisor                Phone: [phone]         Health Care POA: self-Adrick Plueger  Transportation contact phone#: [employer] [facility] [phone]  Please send day of procedure:current med list and meds last taken that day, confirm nothing by mouth status from what time, Patient Demographic info( to include DNR status, problem list, allergies)   RN contact name/phone#:  Bary Leriche                           and Fax #: [phone]  Bring Insurance card and picture ID Leave all jewelry and other valuables at place where living( no metal or rings to be worn) No contact lens Women-no make-up, no lotions,perfumes,powders Men-no colognes,lotions  Any questions day of procedure,call Endoscopy [phone]!   Sent from :[facility]                   [place]                   Fax:[phone]  Sent by :RN:____Wilhemina Tamberlyn Midgley,RN-Presurgical Testing_ contact # [phone]_____

## 2015-12-09 ENCOUNTER — Encounter (HOSPITAL_COMMUNITY): Payer: Self-pay | Admitting: *Deleted

## 2015-12-10 ENCOUNTER — Non-Acute Institutional Stay (SKILLED_NURSING_FACILITY): Payer: Medicare Other | Admitting: Family

## 2015-12-10 DIAGNOSIS — M545 Low back pain, unspecified: Secondary | ICD-10-CM

## 2015-12-10 DIAGNOSIS — R1012 Left upper quadrant pain: Secondary | ICD-10-CM

## 2015-12-10 NOTE — Progress Notes (Signed)
Patient ID: Sean [name], male   DOB: Nov 11, 1935, 80 y.o.   MRN: [id number]  Location:   [place] of Service:  SNF (31) Provider: Dinah Ngetich FNP-C   [name], MD  Patient Care Team: [name], MD as PCP - General (Internal Medicine)  Extended Emergency Contact Information Primary Emergency Contact: [facility] Address: [address]          [place], [place] [address] [name] of [place] Phone: [phone] Relation: Daughter Secondary Emergency Contact: [facility] Address: [address]          [place], [place] [address] [name] Mobile Phone: [phone] Relation: Son  Code Status: Full Code  Goals of care: Advanced Directive information Advanced Directives 11/30/2015  Does patient have an advance directive? No  Would patient like information on creating an advanced directive? Yes - Environmental education officer Complaint  Patient presents with  . Acute Visit    Pain     HPI:  Pt is a 80 y.o. male seen today at [facility]  for an acute visit for abdominal and back pain. He has a significant medical history of ESRD on dialysis, HTN, stroke among others. He is seen in his room today. He was recently diagnosed with Pancreatic mass and Gall stones. He was seen by [facility]  Surgery 12/09/2015. Gall stones Surgery on hold until patient has completed workup for pancreatic head mass if mass present and benigh then surgery but if mass present and cancer patient will follow up with GI Tumor board at [facility]. Patient states has an appointment with GI specialist  12/16/2015. He complains of worsening left abdominal pain referred to the left lower back. He states current pain medication eases off the pain just for a short while. He denies any fever, chills, nausea, vomiting or diarrhea.    Past Medical History  Diagnosis Date  . Hyperparathyroidism   . Arthritis   .  Hyperlipidemia   . Hypertension   . Stroke ([place])   . GERD (gastroesophageal reflux disease)   . Migraine   . Heart attack ([place]) 2011; 1990's  . Peripheral neuropathy ([place])   . Depression with anxiety   . Dialysis patient [facility])     M, W, F; [place] CAD (coronary artery disease)     a. s/p NSTEMI and failed RCA PCI in 2011. b. Nuc 12/2013: nonischemic, prior inferior MI.  Marland Kitchen LBBB (left bundle branch block)   . Calciphylaxis     a. Multiple admissions for nonhealing L ulcer/sepsis, s/p L AKA 02/02/2014.  Marland Kitchen Chronic anemia   . Dementia   . ESRD (end stage renal disease) on dialysis ([place])   . Renal insufficiency   . Hypotension     renovascular  . History of left bundle branch block   . Type II diabetes mellitus ([place])     not taking any medications.  . Cholelithiasis 01/2014    acute cholecystitis managed with perc drain due to high surgical risk.   Marland Kitchen Hx of above knee amputation ([place])     left   Past Surgical History  Procedure Laterality Date  . Parathyroidectomy  2011  . Capd insertion  09/12/2011    Procedure: LAPAROSCOPIC INSERTION CONTINUOUS AMBULATORY PERITONEAL DIALYSIS  (CAPD) CATHETER;  Surgeon: Edward Jolly, MD;  Location: [place];  Service: General;  Laterality: N/A;  . Sp av dialysis shunt  access existing *l*  2003  . Dg av dialysis shunt intro ndl*r* or      "not working" (09/19/11)  . Capd removal  03/12/2012    Procedure: CONTINUOUS AMBULATORY PERITONEAL DIALYSIS  (CAPD) CATHETER REMOVAL;  Surgeon: Edward Jolly, MD;  Location: [place];  Service: General;  Laterality: N/A;  . Amputation Left 02/02/2014    Procedure: AMPUTATION ABOVE KNEE-LEFT;  Surgeon: Elam Dutch, MD;  Location: [place];  Service: Vascular;  Laterality: Left;  . I&d extremity Left 03/31/2014    Procedure: IRRIGATION AND DEBRIDEMENT EXTREMITY - LEFT ABOVE KNEE AMPUTATION;  Surgeon: Elam Dutch, MD;  Location: [place];  Service: Vascular;  Laterality: Left;  . Application of  wound vac Left 03/31/2014    Procedure: APPLICATION OF WOUND VAC - LEFT AKA;  Surgeon: Elam Dutch, MD;  Location: [place];  Service: Vascular;  Laterality: Left;  . Colon surgery  2011    colon resection   . Colonoscopy with propofol N/A 07/14/2014    Procedure: COLONOSCOPY WITH PROPOFOL;  Surgeon: Jerene Bears, MD;  Location: [facility];  Service: Gastroenterology;  Laterality: N/A;  . Esophagogastroduodenoscopy Left 11/28/2015    Procedure: ESOPHAGOGASTRODUODENOSCOPY (EGD);  Surgeon: Juanita Craver, MD;  Location: [facility] ENDOSCOPY;  Service: Endoscopy;  Laterality: Left;    Allergies  Allergen Reactions  . Benazepril Hcl Hives, Itching and Rash    flareups on patient head      Medication List       This list is accurate as of: 12/10/15  4:59 PM.  Always use your most recent med list.               acetaminophen 325 MG tablet  Commonly known as:  TYLENOL  Take 650 mg by mouth every 4 (four) hours as needed for mild pain, fever or headache (DO NOT EXCEED 4 GM OF APAP IN 24 HOURS FORM ALL SOURCES).     amoxicillin 500 MG capsule  Commonly known as:  AMOXIL  Take 1,000 mg by mouth 2 (two) times daily. For 2 weeks     atorvastatin 20 MG tablet  Commonly known as:  LIPITOR  Take 20 mg by mouth at bedtime.     b-complex w/C Tabs tablet  Take 1 tablet by mouth at bedtime.     clarithromycin 500 MG tablet  Commonly known as:  BIAXIN  Take 500 mg by mouth 2 (two) times daily. For 2 weeks     docusate sodium 100 MG capsule  Commonly known as:  COLACE  Take 100 mg by mouth daily.     gabapentin 100 MG capsule  Commonly known as:  NEURONTIN  Take 100 mg by mouth at bedtime.     HYDROcodone-acetaminophen 10-325 MG tablet  Commonly known as:  NORCO  Take one tablet by mouth every 8 hours as needed for moderate pain. Do not exceed 4gm of Tylenol in 24 hours     midodrine 5 MG tablet  Commonly known as:  PROAMATINE  Take 5 mg by mouth 3 (three) times daily. Do not take 6 am  dose on M- W- F- d/t  dialysis days.     omeprazole 20 MG capsule  Commonly known as:  PRILOSEC  Take 20 mg by mouth 2 (two) times daily before a meal. For 2 weeks     sevelamer carbonate 800 MG tablet  Commonly known as:  RENVELA  Take 800 mg by mouth daily with lunch. 12 pm and  5 pm.     sorbitol 70 % solution  Take 30 mLs by mouth daily as needed (severe constipation).     ZOFRAN 4 MG tablet  Generic drug:  ondansetron  Take 4 mg by mouth every 6 (six) hours as needed for nausea.        Review of Systems  Constitutional: Negative for fever, chills, activity change, appetite change and fatigue.  Respiratory: Negative for cough, chest tightness, shortness of breath and wheezing.   Cardiovascular: Negative for chest pain and palpitations.  Gastrointestinal: Positive for abdominal pain. Negative for nausea, vomiting, diarrhea, constipation and abdominal distention.  Genitourinary:       Hemodialysis On Mon, Wed and Friday   Hematological: Does not bruise/bleed easily.  Psychiatric/Behavioral: Negative for hallucinations, confusion, sleep disturbance and agitation. The patient is not nervous/anxious.     Immunization History  Administered Date(s) Administered  . Influenza-Unspecified 04/03/2014, 04/19/2015  . PPD Test 04/03/2014, 04/17/2014, 02/16/2015  . Pneumococcal Polysaccharide-23 02/08/2014  . Pneumococcal-Unspecified 04/03/2014   Pertinent  Health Maintenance Due  Topic Date Due  . OPHTHALMOLOGY EXAM  06/12/2016 (Originally 09/06/1945)  . URINE MICROALBUMIN  06/12/2016 (Originally 09/06/1945)  . PNA vac Low Risk Adult (2 of 2 - PCV13) 08/09/2016 (Originally 04/04/2015)  . HEMOGLOBIN A1C  01/04/2016  . INFLUENZA VACCINE  01/11/2016  . FOOT EXAM  08/18/2016   No flowsheet data found. Functional Status Survey:    Filed Vitals:   12/10/15 1648  BP: 130/56  Pulse: 74  Temp: 97.2 F (36.2 C)  Resp: 20  Height: 5\' 5"  (1.651 m)  Weight: 149 lb (67.586 kg)  SpO2:  98%   Body mass index is 24.79 kg/(m^2). Physical Exam  Constitutional: He is oriented to person, place, and time. He appears well-developed and well-nourished. No distress.  HENT:  Head: Normocephalic.  Mouth/Throat: Oropharynx is clear and moist. No oropharyngeal exudate.  Eyes: Conjunctivae and EOM are normal. Pupils are equal, round, and reactive to light. Right eye exhibits no discharge. Left eye exhibits no discharge. No scleral icterus.  Neck: Normal range of motion. No JVD present.  Cardiovascular: Normal rate, regular rhythm, normal heart sounds and intact distal pulses.  Exam reveals no gallop and no friction rub.   No murmur heard. Pulmonary/Chest: Effort normal and breath sounds normal. No respiratory distress. He has no wheezes. He has no rales.  Abdominal: Soft. Bowel sounds are normal. He exhibits no distension. There is no rebound and no guarding.  LUQ tender to deep palpation  Lymphadenopathy:    He has no cervical adenopathy.  Neurological: He is oriented to person, place, and time.  Skin: Skin is warm and dry.  Psychiatric: He has a normal mood and affect.    Labs reviewed:  Recent Labs  11/08/15 0518  11/09/15 0547 11/10/15 0656 11/10/15 0826  11/26/15 1236 11/29/15 0323 11/30/15 0458  NA 137  < > 138 138 136  < > 135 139 135  K 4.8  < > 3.6 3.8 3.7  < > 3.1* 3.1* 3.4*  CL 99*  < > 97* 98* 98*  < > 99* 108 102  CO2 24  < > 22 26 26   < > 22 20* 21*  GLUCOSE 91  < > 60* 89 88  < > 197* 174* 111*  BUN 36*  < > 19 28* 27*  < > 47* 62* 26*  CREATININE 6.94*  < > 4.57* 6.41* 6.38*  < > 7.09* 9.01* 5.54*  CALCIUM 7.8*  < >  7.7* 7.6* 7.5*  < > 7.9* 7.8* 7.5*  MG 2.1  --  2.0 2.1  --   --   --   --   --   PHOS  --   < >  --   --  3.2  --  1.5*  --  1.7*  < > = values in this interval not displayed.  Recent Labs  11/10/15 0656  11/16/15 11/22/15 1955 11/26/15 1236 11/29/15 0323 11/30/15 0458  AST 19  --  17 19  --  41  --   ALT 11*  --   --  10*  --  16*   --   ALKPHOS 135*  --  180* 144*  --  103  --   BILITOT 1.2  --   --  1.7*  --  0.8  --   PROT 6.7  --   --  7.2  --  5.9*  --   ALBUMIN 3.0*  < >  --  3.1* 2.5* 2.4* 2.5*  < > = values in this interval not displayed.  Recent Labs  11/09/15 0547 11/10/15 0656 11/22/15 1955  11/26/15 1236  11/29/15 0323 11/29/15 0739 11/29/15 1828 11/30/15 0459  WBC 4.1 2.8* 9.5  < > 9.4  --  7.0  --   --  7.2  NEUTROABS 2.7 1.7 7.5  --   --   --   --   --   --   --   HGB 9.9* 9.5* 10.9*  < > 8.7*  < > 8.0*  8.1* 7.9* 8.7* 8.7*  HCT 32.3* 30.8* 34.9*  < > 27.5*  --  25.6*  --   --  26.9*  MCV 85.7 84.2 83.5  < > 82.1  --  82.1  --   --  83.3  PLT 86* 91* 119*  < > 205  --  163  --   --  110*  < > = values in this interval not displayed. Lab Results  Component Value Date   TSH 1.050 12/21/2013   Lab Results  Component Value Date   HGBA1C 6.6 07/07/2015   Lab Results  Component Value Date   CHOL 136 01/20/2015   HDL 57 01/20/2015   LDLCALC 73 01/20/2015   TRIG 30* 01/20/2015   CHOLHDL 4.7 09/20/2011    Significant Diagnostic Results in last 30 days:  Ct Abdomen Pelvis Wo Contrast  11/22/2015  CLINICAL DATA:  Abdominal pain beginning over the last 2 days. No vomiting. Recent diagnosis of abdominal blockage. EXAM: CT ABDOMEN AND PELVIS WITHOUT CONTRAST TECHNIQUE: Multidetector CT imaging of the abdomen and pelvis was performed following the standard protocol without IV contrast. COMPARISON:  CTs, 11/07/2015 and 05/03/2014. FINDINGS: Lung bases: Heart is mildly enlarged. There are dense coronary artery calcifications. There is patchy and linear lung base opacity most evident in the left lower lobe, likely atelectasis. Pneumonia is possible. No pulmonary edema. No pleural effusion. Hepatobiliary: Liver is unremarkable. Gallbladder is distended. There are dependent stones but no convincing wall thickening or pericholecystic fluid. No bile duct dilation. Spleen: Mild enlargement measuring 16.5 x  6.7 x 15.4 cm. No splenic mass or focal lesion. Stable. Pancreas: Pancreas is thickened, particularly the pancreatic head, where it surrounds the celiac axis and superior mesenteric artery vasculature. Lower attenuation areas in the pancreatic tail suggest poorly defined cysts or an irregular dilated duct. Margins of the pancreas are ill-defined. This appearance is similar to the prior exam. Additional soft tissue attenuation  extends along the gastrohepatic ligament consistent with poorly defined adenopathy. Largest presumed node measures 16 mm in short axis. Adrenal glands:  No masses. Kidneys, ureters, bladder: Marked renal parenchymal thinning. Numerous bilateral renal cysts. No hydronephrosis. Ureters normal course and in caliber. Bladder is decompressed. Prostate gland:  Mild to moderately enlarged but stable. Ascites:  None. Gastrointestinal: Mild wall thickening suggested along the descending and sigmoid colon extending to the rectum. There is no bowel dilation to suggest obstruction. There are changes from a previous partial right colectomy, stable. Vascular: There are extensive atherosclerotic calcifications throughout the aorta and branch vessels. Musculoskeletal: Large subchondral cysts are noted along the superior acetabula. There is increased density in the bony structures consistent with renal osteodystrophy. There multiple Schmorl's nodes along the spine. Abdominal wall: Small fat containing left para midline abdominal wall hernia just below the umbilicus. IMPRESSION: 1. Abnormal appearance of the pancreas. An infiltrating pancreatic neoplasm is suspected. This could be better defined with pancreatic MRI with and without contrast if the patient can tolerate that procedure. There is evidence of gastrohepatic ligament chain adenopathy, extending along the porta hepatis. 2. Mild wall thickening of the descending colon, sigmoid colon and rectum consistent with a mild infectious or inflammatory colitis. No  evidence of bowel obstruction. 3. Stable splenomegaly. 4. Distended gallbladder with dependent gallstones but no convincing acute cholecystitis. 5. Significant renal parenchymal thinning with multiple cysts with acquired renal cystic disease. 6. Increased opacity the base of the left lower lobe, most likely atelectasis. Consider pneumonia in the proper clinical setting. Electronically Signed   By: Lajean Manes M.D.   On: 11/22/2015 19:46   Mr Abdomen Wo Contrast  11/25/2015  CLINICAL DATA:  Evaluate pancreas mass EXAM: MRI ABDOMEN WITHOUT CONTRAST TECHNIQUE: Multiplanar multisequence MR imaging was performed without the administration of intravenous contrast. COMPARISON:  11/22/2015 FINDINGS: Exam detail diminished due to motion artifact as well as lack of IV contrast material. Lower chest:  No pleural fluid identified. Hepatobiliary: Abnormal signal of the liver in spleen are noted suggestive of hemochromatosis. No mass identified. Normal appearance of the gallbladder. No biliary dilatation identified. Pancreas: There is abnormal dilatation of the pancreatic duct from the body through the tail. Within the head and neck of pancreas there is a and ill defined area of soft tissue infiltration which is difficult to assess due to lack of IV contrast material. This measures 5.6 cm transverse 2.9 cm AP and 5.9 cm in length. Spleen: The spleen appears enlarged measuring 16 cm in length. No focal splenic abnormality identified. Adrenals/Urinary Tract: The adrenal glands are normal. Changes of bilateral polycystic kidneys identified. Stomach/Bowel: The stomach appears normal. No pathologic dilatation of the upper abdominal bowel loops. Vascular/Lymphatic: Aortic atherosclerosis noted. No aneurysm. The portal vein is not visualized and is presumably occluded by mass. They SMA and celiac arteries have a common origin and both appear partially encased by this mass.No retroperitoneal adenopathy identified peripancreatic  lymph node is enlarged measuring 1.8 cm, image 47 of series 11. Other: No significant free fluid or abnormal fluid collections identified. Musculoskeletal: There is diffuse abnormal signal throughout the bone marrow suggestive of iron overload. IMPRESSION: 1. Poorly defined mass involving the head of pancreas and obstructing the pancreatic duct is again noted. Suspicious for pancreatic adenocarcinoma. This is difficult to further characterize above what was seen on CT from 11/22/2015. This is largely a reflection of lack of IV contrast material (due to chronic kidney disease) and motion artifact. There appears to be involvement of  the celiac and SMA arteries and occlusion of the portal vein. Assuming no no allergy to IV contrast material then further assessment of this lesion with pancreas protocol CT of the abdomen would be advised followed by dialysis. 2. Abnormal signal within the bone marrow, liver and spleen is suggestive of hemochromatosis. 3. Aortic atherosclerosis 4. Bilateral end-stage polycystic kidneys. Electronically Signed   By: Kerby Moors M.D.   On: 11/25/2015 11:29   Dg Chest Port 1 View  11/22/2015  CLINICAL DATA:  Patient comes from [facility] for post CVA post MI. Patient started having Diarrhea since Friday. Patient started having Abd pain and fever 103.1 . Hx of diabetes, stroke, HTN. EXAM: PORTABLE CHEST 1 VIEW COMPARISON:  05/03/2014 FINDINGS: Nasogastric tube has been removed. Heart is mildly enlarged. Film is made with shallow lung inflation. There is linear density and adjacent lucency at the right lung base, likely related to linear atelectasis. However it is difficult to exclude free intraperitoneal air. There is focal atelectasis or early infiltrate at the left lung base. Mild pulmonary edema is noted. IMPRESSION: 1. Question of free intraperitoneal air beneath the right hemidiaphragm. Further evaluation with left lateral decubitus view of the abdomen or CT of the abdomen and  pelvis is recommended. 2. Bibasilar atelectasis or infiltrates, left greater than right. 3. Mild interstitial pulmonary edema. Electronically Signed   By: Nolon Nations M.D.   On: 11/22/2015 17:40   Dg Abd Portable 2v  11/22/2015  CLINICAL DATA:  Diarrhea since Friday with history previous small-bowel obstruction. EXAM: PORTABLE ABDOMEN - 2 VIEW COMPARISON:  X-ray 11/08/2015.  CT scan 10/30/2015. FINDINGS: Right-side-up decubitus film shows no evidence for intraperitoneal free air. Diffuse gaseous small bowel distention is noted. There is some high attenuation which appears to be in small bowel loops over the central abdomen, nonspecific. Given the central clustering of small bowel , ventral hernia would be a consideration. Bones are diffusely demineralized. , IMPRESSION: Diffuse gaseous bowel distention without evidence for intraperitoneal free air. Ileus or small-bowel obstruction could have this appearance. Central clustering of small bowel raises the question for bowel herniation into a ventral hernia which was seen on the previous CT scan. Electronically Signed   By: Misty Stanley M.D.   On: 11/22/2015 19:03   Mr Lambert Mody Cm/mrcp  11/28/2015  CLINICAL DATA:  Patient with pancreatic head mass suspicious for adenocarcinoma. EXAM: MRI ABDOMEN WITHOUT CONTRAST  (INCLUDING MRCP) TECHNIQUE: Multiplanar multisequence MR imaging of the abdomen was performed. Heavily T2-weighted images of the biliary and pancreatic ducts were obtained, and three-dimensional MRCP images were rendered by post processing. COMPARISON:  MRI 11/25/2015; CT abdomen pelvis 11/22/2015. FINDINGS: Examination limited secondary to motion artifact and lack of intravenous contrast material. Lower chest:  No large pleural effusion. Hepatobiliary: Diffuse abnormal signal of the liver suggestive of hemochromatosis. Gallbladder is distended. Layering stones/sludge within the gallbladder lumen. No definite intrahepatic biliary ductal dilatation  identified. Pancreas: Re- demonstrated dilatation of the main pancreatic duct throughout the body and tail measuring up to 11 mm. Markedly limited evaluation of the pancreas due to extensive motion artifact and lack of IV contrast. Suggestion of abnormal irregular soft tissue within the region of the pancreatic head. This is difficult to measure given the above described limitations. Spleen: Enlarged measuring approximately 16 cm. Adrenals/Urinary Tract: Normal adrenal glands. Bilateral polycystic kidneys. Stomach/Bowel: No evidence for bowel obstruction. Vascular/Lymphatic: Normal caliber abdominal aorta. No definite retroperitoneal adenopathy. The portal vein is not visualized and appears to be occluded  from the suspected pancreatic head mass. Re- demonstrated enlarged 1.6 cm peripancreatic lymph node. Other: No significant free fluid in the abdomen. Musculoskeletal: Abnormal signal throughout the bone marrow suggestive of iron overload. IMPRESSION: Markedly limited examination secondary to extensive motion artifact and lack of intravenous contrast material. There is suggestion of a poorly defined mass involving the head of the pancreas with obstruction of the pancreatic duct suspicious for pancreatic adenocarcinoma. Consider dedicated evaluation with pancreatic protocol CT if the patient is on hemodialysis and able to receive intravenous contrast material. Abnormal signal in the bone marrow, liver and spleen suggestive of hemochromatosis. Polycystic kidney disease. Electronically Signed   By: Lovey Newcomer M.D.   On: 11/28/2015 07:55    Assessment/Plan 1. Abdominal pain, left upper quadrant Worsening pain. Recent Pancreatic mass awaiting GI evaluation on 12/16/2015. Current medication ineffective. Will increased Hydrocodone/APAP to every 6 HRS PRN. Continue to monitor.   2. Left-sided low back pain without sciatica This seems to be referred pain from Abdomen LUQ. Recent Pancreatic Mass. Will increased  Hydrocodone/APAP to every 6 HRS PRN. Follow up with GI specialist as scheduled.     Family/ staff Communication: Reviewed plan with patient and facility Nurse supervisor.  Labs/tests ordered:  None

## 2015-12-16 ENCOUNTER — Non-Acute Institutional Stay (HOSPITAL_COMMUNITY)
Admission: RE | Admit: 2015-12-16 | Discharge: 2015-12-16 | Disposition: A | Discharge status: Home / Self Care | Payer: Medicare Other | Source: Ambulatory Visit | Attending: Emergency Medicine | Admitting: Emergency Medicine

## 2015-12-16 ENCOUNTER — Encounter (HOSPITAL_COMMUNITY)
Admission: RE | Disposition: A | Discharge status: Home / Self Care | Payer: Self-pay | Source: Ambulatory Visit | Attending: Emergency Medicine

## 2015-12-16 ENCOUNTER — Ambulatory Visit (HOSPITAL_COMMUNITY): Payer: Medicare Other | Admitting: Anesthesiology

## 2015-12-16 ENCOUNTER — Telehealth: Payer: Self-pay | Admitting: Gastroenterology

## 2015-12-16 ENCOUNTER — Encounter (HOSPITAL_COMMUNITY): Payer: Self-pay | Admitting: Anesthesiology

## 2015-12-16 DIAGNOSIS — K219 Gastro-esophageal reflux disease without esophagitis: Secondary | ICD-10-CM | POA: Diagnosis not present

## 2015-12-16 DIAGNOSIS — Z89612 Acquired absence of left leg above knee: Secondary | ICD-10-CM | POA: Insufficient documentation

## 2015-12-16 DIAGNOSIS — I509 Heart failure, unspecified: Secondary | ICD-10-CM | POA: Insufficient documentation

## 2015-12-16 DIAGNOSIS — Z5309 Procedure and treatment not carried out because of other contraindication: Secondary | ICD-10-CM | POA: Diagnosis not present

## 2015-12-16 DIAGNOSIS — E1140 Type 2 diabetes mellitus with diabetic neuropathy, unspecified: Secondary | ICD-10-CM | POA: Insufficient documentation

## 2015-12-16 DIAGNOSIS — K8689 Other specified diseases of pancreas: Secondary | ICD-10-CM

## 2015-12-16 DIAGNOSIS — I120 Hypertensive chronic kidney disease with stage 5 chronic kidney disease or end stage renal disease: Secondary | ICD-10-CM | POA: Insufficient documentation

## 2015-12-16 DIAGNOSIS — M1990 Unspecified osteoarthritis, unspecified site: Secondary | ICD-10-CM | POA: Diagnosis not present

## 2015-12-16 DIAGNOSIS — E1151 Type 2 diabetes mellitus with diabetic peripheral angiopathy without gangrene: Secondary | ICD-10-CM | POA: Insufficient documentation

## 2015-12-16 DIAGNOSIS — Z87891 Personal history of nicotine dependence: Secondary | ICD-10-CM | POA: Insufficient documentation

## 2015-12-16 DIAGNOSIS — I252 Old myocardial infarction: Secondary | ICD-10-CM | POA: Diagnosis not present

## 2015-12-16 DIAGNOSIS — I2510 Atherosclerotic heart disease of native coronary artery without angina pectoris: Secondary | ICD-10-CM | POA: Insufficient documentation

## 2015-12-16 DIAGNOSIS — F0390 Unspecified dementia without behavioral disturbance: Secondary | ICD-10-CM | POA: Diagnosis not present

## 2015-12-16 DIAGNOSIS — N186 End stage renal disease: Secondary | ICD-10-CM | POA: Diagnosis not present

## 2015-12-16 DIAGNOSIS — Z8673 Personal history of transient ischemic attack (TIA), and cerebral infarction without residual deficits: Secondary | ICD-10-CM | POA: Diagnosis not present

## 2015-12-16 DIAGNOSIS — E1122 Type 2 diabetes mellitus with diabetic chronic kidney disease: Secondary | ICD-10-CM | POA: Diagnosis not present

## 2015-12-16 DIAGNOSIS — E875 Hyperkalemia: Secondary | ICD-10-CM | POA: Diagnosis not present

## 2015-12-16 DIAGNOSIS — Z79899 Other long term (current) drug therapy: Secondary | ICD-10-CM | POA: Insufficient documentation

## 2015-12-16 DIAGNOSIS — N185 Chronic kidney disease, stage 5: Secondary | ICD-10-CM

## 2015-12-16 DIAGNOSIS — Z992 Dependence on renal dialysis: Secondary | ICD-10-CM | POA: Diagnosis not present

## 2015-12-16 DIAGNOSIS — E785 Hyperlipidemia, unspecified: Secondary | ICD-10-CM | POA: Diagnosis not present

## 2015-12-16 HISTORY — DX: Acquired absence of unspecified leg above knee: Z89.619

## 2015-12-16 LAB — CBC
HEMATOCRIT: 32.1 % — AB (ref 39.0–52.0)
HEMOGLOBIN: 10.7 g/dL — AB (ref 13.0–17.0)
MCH: 27 pg (ref 26.0–34.0)
MCHC: 33.3 g/dL (ref 30.0–36.0)
MCV: 80.9 fL (ref 78.0–100.0)
Platelets: 109 10*3/uL — ABNORMAL LOW (ref 150–400)
RBC: 3.97 MIL/uL — ABNORMAL LOW (ref 4.22–5.81)
RDW: 19.8 % — AB (ref 11.5–15.5)
WBC: 4.8 10*3/uL (ref 4.0–10.5)

## 2015-12-16 LAB — BASIC METABOLIC PANEL
ANION GAP: 13 (ref 5–15)
BUN: 18 mg/dL (ref 6–20)
CALCIUM: 9.1 mg/dL (ref 8.9–10.3)
CHLORIDE: 102 mmol/L (ref 101–111)
CO2: 23 mmol/L (ref 22–32)
Creatinine, Ser: 7.57 mg/dL — ABNORMAL HIGH (ref 0.61–1.24)
GFR calc non Af Amer: 6 mL/min — ABNORMAL LOW (ref >60)
GFR, EST AFRICAN AMERICAN: 7 mL/min — AB (ref >60)
Glucose, Bld: 72 mg/dL (ref 65–99)
POTASSIUM: 4.3 mmol/L (ref 3.5–5.1)
SODIUM: 138 mmol/L (ref 135–145)

## 2015-12-16 LAB — POCT I-STAT 4, (NA,K, GLUC, HGB,HCT)
Glucose, Bld: 79 mg/dL (ref 65–99)
HCT: 38 % — ABNORMAL LOW (ref 39.0–52.0)
HEMOGLOBIN: 12.9 g/dL — AB (ref 13.0–17.0)
Potassium: >8.5 mmol/L (ref 3.5–5.1)
Sodium: 136 mmol/L (ref 135–145)

## 2015-12-16 SURGERY — CANCELLED PROCEDURE
Anesthesia: Monitor Anesthesia Care

## 2015-12-16 MED ORDER — PROMETHAZINE HCL 25 MG/ML IJ SOLN: 6.2500 mg | INTRAMUSCULAR | Status: DC | PRN

## 2015-12-16 MED ORDER — SODIUM CHLORIDE 0.9 % IV SOLN: INTRAVENOUS | Status: DC

## 2015-12-16 MED ORDER — PROPOFOL 10 MG/ML IV BOLUS
INTRAVENOUS | Status: AC
  Filled 2015-12-16: qty 60

## 2015-12-16 MED ORDER — FENTANYL CITRATE (PF) 100 MCG/2ML IJ SOLN: 25.0000 ug | INTRAMUSCULAR | Status: DC | PRN

## 2015-12-16 NOTE — Telephone Encounter (Signed)
He didn't "feel up to HD yesterday" and so didn't go. Today his K is >8.5. His EUS is cancelled and we're making calls to his nephrologist about triaging him to HD or ER.  Patty, We need to reschedule his EUS to next week.  Please reiterate the importance that he go to his usual HD (M, W, Sat) and not skip.  thanks  Sean [name], [place]

## 2015-12-16 NOTE — Interval H&P Note (Signed)
History and Physical Interval Note:  12/16/2015 10:37 AM  Sean [name]  has presented today for surgery, with the diagnosis of pancreatic mass  The various methods of treatment have been discussed with the patient and family. After consideration of risks, benefits and other options for treatment, the patient has consented to  Procedure(s): UPPER ENDOSCOPIC ULTRASOUND (EUS) RADIAL (N/A) as a surgical intervention .  The patient's history has been reviewed, patient examined, no change in status, stable for surgery.  I have reviewed the patient's chart and labs.  Questions were answered to the patient's satisfaction.     Milus Banister

## 2015-12-16 NOTE — ED Provider Notes (Signed)
CSN: [id number]     Arrival date & time 12/16/15  1145 History   First MD Initiated Contact with Patient 12/16/15 1151     Chief Complaint  Patient presents with  . elevated potassium    HPI The patient was sent to the emergency room for evaluation of elevated potassium. Patient was scheduled to have an outpatient endoscopy procedure today. As part of his preop evaluation he had an i-STAT chem for performed   the results showed that his potassium level was greater than 8.5. Patient was sent down to the emergency room for further evaluation. Patient does admit that he skipped dialysis yesterday. He goes Monday Wednesday Friday and did go on Monday. Patient states he was feeling fatigued and just not up to having dialysis yesterday. He trouble with any chest pain or shortness of breath. He denies any nausea or vomiting. Past Medical History  Diagnosis Date  . Hyperparathyroidism   . Arthritis   . Hyperlipidemia   . Hypertension   . Stroke ([place])   . GERD (gastroesophageal reflux disease)   . Migraine   . Heart attack ([place]) 2011; 1990's  . Peripheral neuropathy ([place])   . Depression with anxiety   . Dialysis patient [facility])     M, W, F; [facility] CAD (coronary artery disease)     a. s/p NSTEMI and failed RCA PCI in 2011. b. Nuc 12/2013: nonischemic, prior inferior MI.  Marland Kitchen LBBB (left bundle branch block)   . Calciphylaxis     a. Multiple admissions for nonhealing L ulcer/sepsis, s/p L AKA 02/02/2014.  Marland Kitchen Chronic anemia   . Dementia   . ESRD (end stage renal disease) on dialysis ([place])   . Renal insufficiency   . Hypotension     renovascular  . History of left bundle branch block   . Type II diabetes mellitus ([place])     not taking any medications.  . Cholelithiasis 01/2014    acute cholecystitis managed with perc drain due to high surgical risk.   Marland Kitchen Hx of above knee amputation ([place])     left   Past Surgical History  Procedure Laterality Date  . Parathyroidectomy  2011  . Capd  insertion  09/12/2011    Procedure: LAPAROSCOPIC INSERTION CONTINUOUS AMBULATORY PERITONEAL DIALYSIS  (CAPD) CATHETER;  Surgeon: Edward Jolly, MD;  Location: [place];  Service: General;  Laterality: N/A;  . Sp av dialysis shunt access existing *l*  2003  . Dg av dialysis shunt intro ndl*r* or      "not working" (09/19/11)  . Capd removal  03/12/2012    Procedure: CONTINUOUS AMBULATORY PERITONEAL DIALYSIS  (CAPD) CATHETER REMOVAL;  Surgeon: Edward Jolly, MD;  Location: [place];  Service: General;  Laterality: N/A;  . Amputation Left 02/02/2014    Procedure: AMPUTATION ABOVE KNEE-LEFT;  Surgeon: Elam Dutch, MD;  Location: [place];  Service: Vascular;  Laterality: Left;  . I&d extremity Left 03/31/2014    Procedure: IRRIGATION AND DEBRIDEMENT EXTREMITY - LEFT ABOVE KNEE AMPUTATION;  Surgeon: Elam Dutch, MD;  Location: [place];  Service: Vascular;  Laterality: Left;  . Application of wound vac Left 03/31/2014    Procedure: APPLICATION OF WOUND VAC - LEFT AKA;  Surgeon: Elam Dutch, MD;  Location: [place];  Service: Vascular;  Laterality: Left;  . Colon surgery  2011    colon resection   . Colonoscopy with propofol N/A 07/14/2014    Procedure: COLONOSCOPY WITH PROPOFOL;  Surgeon: Jerene Bears, MD;  Location: [facility];  Service: Gastroenterology;  Laterality: N/A;  . Esophagogastroduodenoscopy Left 11/28/2015    Procedure: ESOPHAGOGASTRODUODENOSCOPY (EGD);  Surgeon: Juanita Craver, MD;  Location: [facility] END[facility]COPY;  Service: Endoscopy;  Laterality: Left;   Family History  Problem Relation Age of Onset  . Cancer Sister     breast  . Cancer Brother     lung  . Anesthesia problems Neg Hx   . Hypotension Neg Hx   . Malignant hyperthermia Neg Hx   . Pseudochol deficiency Neg Hx    Social History  Substance Use Topics  . Smoking status: Former Smoker -- 0.25 packs/day for 7 years    Types: Cigarettes    Quit date: 06/12/1976  . Smokeless tobacco: Never Used  . Alcohol Use: No     Review of Systems    Allergies  Benazepril hcl  Home Medications   Prior to Admission medications   Medication Sig Start Date End Date Taking? Authorizing Provider  amoxicillin (AMOXIL) 500 MG capsule Take 1,000 mg by mouth 2 (two) times daily. For 2 weeks 12/02/15 12/16/15 Yes Historical Provider, MD  atorvastatin (LIPITOR) 20 MG tablet Take 20 mg by mouth at bedtime.    Yes Historical Provider, MD  b-complex w/C (TOTAL B/C) TABS tablet Take 1 tablet by mouth at bedtime.   Yes Historical Provider, MD  clarithromycin (BIAXIN) 500 MG tablet Take 500 mg by mouth 2 (two) times daily. For 2 weeks 12/02/15 12/16/15 Yes Historical Provider, MD  docusate sodium (COLACE) 100 MG capsule Take 100 mg by mouth daily.   Yes Historical Provider, MD  gabapentin (NEURONTIN) 100 MG capsule Take 100 mg by mouth at bedtime.    Yes Historical Provider, MD  HYDROcodone-acetaminophen (NORCO) 10-325 MG tablet Take one tablet by mouth every 8 hours as needed for moderate pain. Do not exceed 4gm of Tylenol in 24 hours 12/01/15  Yes Gildardo Cranker, DO  midodrine (PROAMATINE) 5 MG tablet Take 5 mg by mouth 3 (three) times daily. Do not take 6 am dose on M- W- F- d/t  dialysis days. 02/01/15  Yes Historical Provider, MD  omeprazole (PRILOSEC) 20 MG capsule Take 20 mg by mouth 2 (two) times daily before a meal. For 2 weeks 12/02/15  Yes Historical Provider, MD  ondansetron (ZOFRAN) 4 MG tablet Take 4 mg by mouth every 6 (six) hours as needed for nausea.   Yes Historical Provider, MD  sevelamer carbonate (RENVELA) 800 MG tablet Take 800 mg by mouth daily with lunch. 12 pm and 5 pm.   Yes Historical Provider, MD  sorbitol 70 % solution Take 30 mLs by mouth daily as needed (severe constipation).   Yes Historical Provider, MD  acetaminophen (TYLENOL) 325 MG tablet Take 650 mg by mouth every 4 (four) hours as needed for mild pain, fever or headache (DO NOT EXCEED 4 GM OF APAP IN 24 HOURS FORM ALL SOURCES).     Historical Provider,  MD   BP 109/70 mmHg  Pulse 93  Temp(Src) 97.5 F (36.4 C) (Oral)  Resp 18  SpO2 100% Physical Exam  Constitutional: He appears well-developed and well-nourished. No distress.  HENT:  Head: Normocephalic and atraumatic.  Right Ear: External ear normal.  Left Ear: External ear normal.  Eyes: Conjunctivae are normal. Right eye exhibits no discharge. Left eye exhibits no discharge. No scleral icterus.  Neck: Neck supple. No tracheal deviation present.  Cardiovascular: Normal rate, regular rhythm and normal heart sounds.  Pulmonary/Chest: Effort normal and breath sounds normal. No stridor. No respiratory distress. He has no wheezes. He has no rales.  Musculoskeletal: He exhibits no edema.  Av fistula left UE  Neurological: He is alert. Cranial nerve deficit: no gross deficits.  Skin: Skin is warm and dry. No rash noted.  Psychiatric: He has a normal mood and affect.  Nursing note and vitals reviewed.   ED Course  Procedures (including critical care time) Labs Review Labs Reviewed  BASIC METABOLIC PANEL - Abnormal; Notable for the following:    Creatinine, Ser 7.57 (*)    GFR calc non Af Amer 6 (*)    GFR calc Af Amer 7 (*)    All other components within normal limits  CBC - Abnormal; Notable for the following:    RBC 3.97 (*)    Hemoglobin 10.7 (*)    HCT 32.1 (*)    RDW 19.8 (*)    Platelets 109 (*)    All other components within normal limits  POCT I-STAT 4, (NA,K, GLUC, HGB,HCT) - Abnormal; Notable for the following:    Potassium >8.5 (*)    HCT 38.0 (*)    Hemoglobin 12.9 (*)    All other components within normal limits    Imaging Review No results found. I have personally reviewed and evaluated these images and lab results as part of my medical decision-making.   EKG Interpretation   Date/Time:  Thursday December 16 2015 12:18:45 EDT Ventricular Rate:  91 PR Interval:    QRS Duration: 157 QT Interval:  446 QTC Calculation: 549 R Axis:   -3 Text  Interpretation:  Sinus rhythm Supraventricular bigeminy Left bundle  branch block no STEMI Confirmed by LONG MD, JOSHUA [id number]) on 12/16/2015  12:30:10 PM Also confirmed by LONG MD, JOSHUA [id number]), editor [place]   CCT, BEVERLY ([id number])  on 12/16/2015 1:24:43 PM      MDM   Final diagnoses:  Chronic renal failure, stage 5 (HCC)  End stage renal disease on dialysis [facility])    Patient does generally go to dialysis regularly. He has not had trouble with very elevated potassium levels in the past. The laboratory tests shows an elevated potassium of 8.5 however this was an i-STAT test and its possible there was hemolysis. Patient's EKG is unchanged. I have ordered a repeat potassium. I'll hold off on treatment until we can confirm that.  1500  Repeat potassium is normal.  First specimen was erroneous.  Discussed with Dr Mercy Moore.  Pt can follow up with dialysis tomorrow as planned.  Family and pt state they will go home and plan for the procedure next week.   Dorie Rank, MD 12/16/15 [phone]

## 2015-12-16 NOTE — Progress Notes (Signed)
Dr. Mercy Moore called ENDO back and reported that patients K+ was fine and wanted to know if we could still do his procedure today, I asked Dr. Ardis Hughs and he is unable to do the procedure today and wants him to reschedule for another day. I notified the RN in the ED and let her know that he will need to reschedule.

## 2015-12-16 NOTE — ED Notes (Signed)
Bed: [id number] Expected date:  Expected time:  Means of arrival:  Comments: Endo-elevated K+-missed dialysis

## 2015-12-16 NOTE — Final Progress Note (Signed)
Procedure cancelled per Anesthesia due to elevated potassium. Pt taken to the ED per Dr Mercy Moore and Dr Delma Post for further evaluation.

## 2015-12-16 NOTE — H&P (View-Only) (Signed)
          Daily Rounding Note  11/30/2015, 12:02 PM  LOS: 8 days   SUBJECTIVE:       2 or 3 soft or loose stools.  Non-severe discomfort in LUQ.  Not very hungry but tolerating solids.   OBJECTIVE:         Vital signs in last 24 hours:    Temp:  [98.1 F (36.7 C)-99.2 F (37.3 C)] 98.7 F (37.1 C) (06/20 0744) Pulse Rate:  [80-105] 85 (06/20 0744) Resp:  [18-20] 18 (06/20 0744) BP: (106-139)/(48-68) 139/61 mmHg (06/20 0744) SpO2:  [98 %-100 %] 99 % (06/20 0744) Weight:  [72.2 kg (159 lb 2.8 oz)] 72.2 kg (159 lb 2.8 oz) (06/19 1524) Last BM Date: 11/30/15 Filed Weights   11/29/15 0300 11/29/15 1050 11/29/15 1524  Weight: 72 kg (158 lb 11.7 oz) 72.2 kg (159 lb 2.8 oz) 72.2 kg (159 lb 2.8 oz)   General: looks chronically unwell, comfortable   Heart: RRR Chest: clear bil Abdomen: soft, minor LUQ tenderness.  Active BS  Extremities: amputation aka on left.  No swelling on right LE Neuro/Psych:  Oriented x 3.  Alert.  Appropriate.   Intake/Output from previous day: 06/19 0701 - 06/20 0700 In: 875 [P.O.:360; I.V.:515] Out: 502 [Stool:2]  Intake/Output this shift: Total I/O In: 230 [P.O.:230] Out: 0   Lab Results:  Recent Labs  11/29/15 0323 11/29/15 0739 11/29/15 1828 11/30/15 0459  WBC 7.0  --   --  7.2  HGB 8.0*  8.1* 7.9* 8.7* 8.7*  HCT 25.6*  --   --  26.9*  PLT 163  --   --  110*   BMET  Recent Labs  11/29/15 0323 11/30/15 0458  NA 139 135  K 3.1* 3.4*  CL 108 102  CO2 20* 21*  GLUCOSE 174* 111*  BUN 62* 26*  CREATININE 9.01* 5.54*  CALCIUM 7.8* 7.5*   LFT  Recent Labs  11/29/15 0323 11/30/15 0458  PROT 5.9*  --   ALBUMIN 2.4* 2.5*  AST 41  --   ALT 16*  --   ALKPHOS 103  --   BILITOT 0.8  --    PT/INR No results for input(s): LABPROT, INR in the last 72 hours. Hepatitis Panel No results for input(s): HEPBSAG, HCVAB, HEPAIGM, HEPBIGM in the last 72 hours.  Studies/Results: No  results found.  ASSESMENT:   * CG emesis and melenic stool. 6/18 EGD, Dr Collene Mares: grade 1 esophagitis, non-bleeding duodenal ulcer without stigmata.   * C diff, on IV flagyl, po Vanc (6/13 began 2 week course). Colitis noted on CT. Diarrhea resolved  * Pancreatic mass and gastrohepatic and porta hepatis adenopathy, CA 19-9 has been obtained 2 times: 2918 and 2662. A 3rd CA 19-9 is also in orders  * Anemia, normocytic. Not on Epo, not on parenteral or PO iron.    *  ESRD.     PLAN   *  Dr Owens Loffler will be arranging outpt EUS of pancreatic mass for early July.  Will contact pt with specifics.   *  Complete 2 weeks total of oral Vanc.   *  Once daily PPI.  If Serum H Pylori Ab is +, will need abx for this.     Sean [name]  11/30/2015, 12:02 PM Pager: [phone]

## 2015-12-16 NOTE — Interval H&P Note (Signed)
His preprocedure labs show K of 8.5.  It is not safe to proceed with the case today. He apparently did not go to dialysis yesterday because he "didn't feel up to it".  Staff is trying to reach his nephrologist for recommendations. Will send to ER if needed.  We will reschedule this for 1-2 weeks from now.

## 2015-12-16 NOTE — Anesthesia Preprocedure Evaluation (Addendum)
Anesthesia Evaluation  Patient identified by MRN, date of birth, ID band Patient awake    Reviewed: Allergy & Precautions, NPO status , Patient's Chart, lab work & pertinent test results  Airway Mallampati: II  TM Distance: >3 FB Neck ROM: Full    Dental no notable dental hx.    Pulmonary former smoker,    Pulmonary exam normal breath sounds clear to auscultation       Cardiovascular hypertension, + CAD, + Past MI, + Peripheral Vascular Disease and +CHF  Normal cardiovascular exam+ dysrhythmias  Rhythm:Regular Rate:Normal     Neuro/Psych  Headaches, PSYCHIATRIC DISORDERS Anxiety Depression  Neuromuscular disease CVA    GI/Hepatic Neg liver ROS, PUD, GERD  Medicated,  Endo/Other  diabetes, Type 2  Renal/GU ESRF and DialysisRenal disease  negative genitourinary   Musculoskeletal  (+) Arthritis ,   Abdominal   Peds negative pediatric ROS (+)  Hematology  (+) anemia ,   Anesthesia Other Findings   Reproductive/Obstetrics negative OB ROS                          Anesthesia Physical Anesthesia Plan  ASA: III  Anesthesia Plan: MAC   Post-op Pain Management:    Induction: Intravenous  Airway Management Planned: Natural Airway  Additional Equipment:   Intra-op Plan:   Post-operative Plan:   Informed Consent: I have reviewed the patients History and Physical, chart, labs and discussed the procedure including the risks, benefits and alternatives for the proposed anesthesia with the patient or authorized representative who has indicated his/her understanding and acceptance.   Dental advisory given  Plan Discussed with: CRNA  Anesthesia Plan Comments: (1055: Potassium result is elevated at >8.5. Patient denies symptoms. He did not get dialyzed yesterday as he didn't feel up to it. Called Dr. Mercy Moore (on call at [facility] for nephrology). Recommends sending to ER for further workup.  Discussed with Dr. Ardis Hughs.)       Anesthesia Quick Evaluation

## 2015-12-16 NOTE — ED Notes (Signed)
Per ENDO, states had procedure done-states potassium 8.3-states he missed dialysis-here to have lab rechecked-if elevated he needs to be transferred to [facility] for dialysis

## 2015-12-16 NOTE — ED Notes (Signed)
[facility] called and wanted to know if patient is returning back to facility.  Family members to contact facility.

## 2015-12-17 NOTE — Telephone Encounter (Signed)
No voicemail to leave message

## 2015-12-21 NOTE — Telephone Encounter (Signed)
Rosemary from [facility] calling to reschedule patient's EUS from 12-16-15. Please return call to [phone].

## 2015-12-23 ENCOUNTER — Encounter: Payer: Self-pay | Admitting: Family

## 2015-12-23 ENCOUNTER — Non-Acute Institutional Stay (SKILLED_NURSING_FACILITY): Payer: Medicare Other | Admitting: Family

## 2015-12-23 DIAGNOSIS — Z992 Dependence on renal dialysis: Secondary | ICD-10-CM

## 2015-12-23 DIAGNOSIS — D631 Anemia in chronic kidney disease: Secondary | ICD-10-CM | POA: Diagnosis not present

## 2015-12-23 DIAGNOSIS — I5022 Chronic systolic (congestive) heart failure: Secondary | ICD-10-CM

## 2015-12-23 DIAGNOSIS — E1122 Type 2 diabetes mellitus with diabetic chronic kidney disease: Secondary | ICD-10-CM

## 2015-12-23 DIAGNOSIS — N189 Chronic kidney disease, unspecified: Secondary | ICD-10-CM

## 2015-12-23 DIAGNOSIS — E785 Hyperlipidemia, unspecified: Secondary | ICD-10-CM | POA: Diagnosis not present

## 2015-12-23 DIAGNOSIS — K8689 Other specified diseases of pancreas: Secondary | ICD-10-CM

## 2015-12-23 DIAGNOSIS — N186 End stage renal disease: Secondary | ICD-10-CM | POA: Diagnosis not present

## 2015-12-23 DIAGNOSIS — K869 Disease of pancreas, unspecified: Secondary | ICD-10-CM | POA: Diagnosis not present

## 2015-12-23 DIAGNOSIS — L309 Dermatitis, unspecified: Secondary | ICD-10-CM | POA: Diagnosis not present

## 2015-12-23 NOTE — Progress Notes (Signed)
Location:   [place] Room Number: [id number] Place of Service:  SNF (31) Provider: Marlowe Sax FNP-C   Patient Care Team: [name], MD as PCP - General (Internal Medicine)  Extended Emergency Contact Information Primary Emergency Contact: [facility] Address: [address]          [place], [place] [address] [name] of [place] Phone: [phone] Relation: Daughter Secondary Emergency Contact: [facility] Address: [address]          [place], [place] [address] [name] Mobile Phone: [phone] Relation: Son  Code Status:  Full Code Goals of care: Advanced Directive information Advanced Directives 12/23/2015  Does patient have an advance directive? No  Would patient like information on creating an advanced directive? No - patient declined information     Chief Complaint  Patient presents with  . Medical Management of Chronic Issues    HPI:  Pt is a 80 y.o. male seen today at [facility] for medical management of chronic diseases.He has a medical history of ESRD, Type 2 DM, Stroke, GERD, Hyperlipidemia, Pancreatic Mass among other conditions. He is seen in his room today lying in the bed. He states has had generalized weakness requiring more assistance with ADL's. He is waiting for his left pancreatic mass biopsy appointment to be rescheduled with GI specialist. His previous appointment was cancelled due to elevated potassium level during visit was send to ED. He states pain under control with current pain medication. Facility staff reports no new concerns.       Past Medical History  Diagnosis Date  . Hyperparathyroidism   . Arthritis   . Hyperlipidemia   . Hypertension   . Stroke ([place])   . GERD (gastroesophageal reflux disease)   . Migraine   . Heart attack ([place]) 2011; 1990's  . Peripheral neuropathy ([place])   . Depression with anxiety   . Dialysis patient [facility])     M, W,  F; [place] CAD (coronary artery disease)     a. s/p NSTEMI and failed RCA PCI in 2011. b. Nuc 12/2013: nonischemic, prior inferior MI.  Marland Kitchen LBBB (left bundle branch block)   . Calciphylaxis     a. Multiple admissions for nonhealing L ulcer/sepsis, s/p L AKA 02/02/2014.  Marland Kitchen Chronic anemia   . Dementia   . ESRD (end stage renal disease) on dialysis ([place])   . Renal insufficiency   . Hypotension     renovascular  . History of left bundle branch block   . Type II diabetes mellitus ([place])     not taking any medications.  . Cholelithiasis 01/2014    acute cholecystitis managed with perc drain due to high surgical risk.   Marland Kitchen Hx of above knee amputation ([place])     left   Past Surgical History  Procedure Laterality Date  . Parathyroidectomy  2011  . Capd insertion  09/12/2011    Procedure: LAPAROSCOPIC INSERTION CONTINUOUS AMBULATORY PERITONEAL DIALYSIS  (CAPD) CATHETER;  Surgeon: Edward Jolly, MD;  Location: [place];  Service: General;  Laterality: N/A;  . Sp av dialysis shunt access existing *l*  2003  . Dg av dialysis shunt intro ndl*r* or      "not working" (09/19/11)  . Capd removal  03/12/2012    Procedure: CONTINUOUS AMBULATORY PERITONEAL DIALYSIS  (CAPD) CATHETER REMOVAL;  Surgeon: Edward Jolly, MD;  Location: [place];  Service: General;  Laterality: N/A;  .  Amputation Left 02/02/2014    Procedure: AMPUTATION ABOVE KNEE-LEFT;  Surgeon: Elam Dutch, MD;  Location: [place];  Service: Vascular;  Laterality: Left;  . I&d extremity Left 03/31/2014    Procedure: IRRIGATION AND DEBRIDEMENT EXTREMITY - LEFT ABOVE KNEE AMPUTATION;  Surgeon: Elam Dutch, MD;  Location: [place];  Service: Vascular;  Laterality: Left;  . Application of wound vac Left 03/31/2014    Procedure: APPLICATION OF WOUND VAC - LEFT AKA;  Surgeon: Elam Dutch, MD;  Location: [place];  Service: Vascular;  Laterality: Left;  . Colon surgery  2011    colon resection   . Colonoscopy with propofol N/A  07/14/2014    Procedure: COLONOSCOPY WITH PROPOFOL;  Surgeon: Jerene Bears, MD;  Location: [facility];  Service: Gastroenterology;  Laterality: N/A;  . Esophagogastroduodenoscopy Left 11/28/2015    Procedure: ESOPHAGOGASTRODUODENOSCOPY (EGD);  Surgeon: Juanita Craver, MD;  Location: [facility] ENDOSCOPY;  Service: Endoscopy;  Laterality: Left;    Allergies  Allergen Reactions  . Benazepril Hcl Hives, Itching and Rash    flareups on patient head      Medication List       This list is accurate as of: 12/23/15  3:46 PM.  Always use your most recent med list.               acetaminophen 325 MG tablet  Commonly known as:  TYLENOL  Take 650 mg by mouth every 4 (four) hours as needed for mild pain, fever or headache (DO NOT EXCEED 4 GM OF APAP IN 24 HOURS FORM ALL SOURCES).     atorvastatin 20 MG tablet  Commonly known as:  LIPITOR  Take 20 mg by mouth at bedtime.     b-complex w/C Tabs tablet  Take 1 tablet by mouth at bedtime.     docusate sodium 100 MG capsule  Commonly known as:  COLACE  Take 100 mg by mouth daily.     gabapentin 100 MG capsule  Commonly known as:  NEURONTIN  Take 100 mg by mouth at bedtime.     HYDROcodone-acetaminophen 10-325 MG tablet  Commonly known as:  NORCO  Take one tablet by mouth every 8 hours as needed for moderate pain. Do not exceed 4gm of Tylenol in 24 hours     midodrine 5 MG tablet  Commonly known as:  PROAMATINE  Take 5 mg by mouth 3 (three) times daily. Do not take 6 am dose on M- W- F- d/t  dialysis days.     omeprazole 20 MG capsule  Commonly known as:  PRILOSEC  Take 20 mg by mouth 2 (two) times daily before a meal. For 2 weeks     sevelamer carbonate 800 MG tablet  Commonly known as:  RENVELA  Take 800 mg by mouth daily with lunch. 12 pm and 5 pm.     sorbitol 70 % solution  Take 30 mLs by mouth daily as needed (severe constipation).     ZOFRAN 4 MG tablet  Generic drug:  ondansetron  Take 4 mg by mouth every 6 (six) hours as  needed for nausea.        Review of Systems  Constitutional: Positive for fatigue. Negative for fever, chills, activity change and appetite change.  HENT: Negative for congestion, rhinorrhea, sinus pressure, sneezing and sore throat.   Eyes: Negative.   Respiratory: Negative for cough, chest tightness, shortness of breath and wheezing.   Cardiovascular: Negative for chest pain and palpitations.  Gastrointestinal:  Positive for abdominal pain. Negative for nausea, vomiting, diarrhea, constipation and abdominal distention.  Endocrine: Negative.   Genitourinary:       Hemodialysis On Mon, Wed and Friday   Musculoskeletal: Positive for gait problem.       Left BKA   Skin: Negative for color change, pallor and rash.  Neurological: Negative for dizziness, seizures, syncope and headaches.  Hematological: Does not bruise/bleed easily.  Psychiatric/Behavioral: Negative for hallucinations, confusion, sleep disturbance and agitation. The patient is not nervous/anxious.     Immunization History  Administered Date(s) Administered  . Influenza-Unspecified 04/03/2014, 04/19/2015  . PPD Test 04/03/2014, 04/17/2014, 02/16/2015  . Pneumococcal Polysaccharide-23 02/08/2014  . Pneumococcal-Unspecified 04/03/2014   Pertinent  Health Maintenance Due  Topic Date Due  . OPHTHALMOLOGY EXAM  06/12/2016 (Originally 09/06/1945)  . URINE MICROALBUMIN  06/12/2016 (Originally 09/06/1945)  . PNA vac Low Risk Adult (2 of 2 - PCV13) 08/09/2016 (Originally 04/04/2015)  . HEMOGLOBIN A1C  01/04/2016  . INFLUENZA VACCINE  01/11/2016  . FOOT EXAM  08/18/2016   No flowsheet data found. Functional Status Survey:    Filed Vitals:   12/23/15 1529  BP: 131/76  Pulse: 100  Temp: 97.6 F (36.4 C)  Resp: 18  Height: 5\' 5"  (1.651 m)  Weight: 149 lb (67.586 kg)  SpO2: 94%   Body mass index is 24.79 kg/(m^2). Physical Exam  Constitutional: He is oriented to person, place, and time. He appears well-developed and  well-nourished. No distress.  HENT:  Head: Normocephalic.  Mouth/Throat: Oropharynx is clear and moist. No oropharyngeal exudate.  Eyes: Conjunctivae and EOM are normal. Pupils are equal, round, and reactive to light. Right eye exhibits no discharge. Left eye exhibits no discharge. No scleral icterus.  Neck: Normal range of motion. No JVD present. No thyromegaly present.  Cardiovascular: Normal rate, regular rhythm, normal heart sounds and intact distal pulses.  Exam reveals no gallop and no friction rub.   No murmur heard. Pulmonary/Chest: Effort normal and breath sounds normal. No respiratory distress. He has no wheezes. He has no rales.  Abdominal: Soft. Bowel sounds are normal. He exhibits no distension. There is no rebound and no guarding.  Left upper quadrant tender to deep palpation   Musculoskeletal: He exhibits no edema or tenderness.  Left BKA   Lymphadenopathy:    He has no cervical adenopathy.  Neurological: He is oriented to person, place, and time.  Skin: Skin is warm and dry. No rash noted. No erythema. No pallor.  Right leg skin dryness   Psychiatric: He has a normal mood and affect.    Labs reviewed:  Recent Labs  11/08/15 0518  11/09/15 0547 11/10/15 0656 11/10/15 0826  11/26/15 1236 11/29/15 0323 11/30/15 0458 12/06/15 12/16/15 1050 12/16/15 1326  NA 137  < > 138 138 136  < > 135 139 135 140 136 138  K 4.8  < > 3.6 3.8 3.7  < > 3.1* 3.1* 3.4* 4.5 >8.5* 4.3  CL 99*  < > 97* 98* 98*  < > 99* 108 102  --   --  102  CO2 24  < > 22 26 26   < > 22 20* 21*  --   --  23  GLUCOSE 91  < > 60* 89 88  < > 197* 174* 111*  --  79 72  BUN 36*  < > 19 28* 27*  < > 47* 62* 26*  --   --  18  CREATININE 6.94*  < > 4.57*  6.41* 6.38*  < > 7.09* 9.01* 5.54*  --   --  7.57*  CALCIUM 7.8*  < > 7.7* 7.6* 7.5*  < > 7.9* 7.8* 7.5*  --   --  9.1  MG 2.1  --  2.0 2.1  --   --   --   --   --   --   --   --   PHOS  --   < >  --   --  3.2  --  1.5*  --  1.7*  --   --   --   < > = values  in this interval not displayed.  Recent Labs  11/10/15 0656  11/22/15 1955 11/26/15 1236 11/29/15 0323 11/30/15 0458 12/06/15  AST 19  < > 19  --  41  --  27  ALT 11*  --  10*  --  16*  --  9*  ALKPHOS 135*  < > 144*  --  103  --  109  BILITOT 1.2  --  1.7*  --  0.8  --   --   PROT 6.7  --  7.2  --  5.9*  --   --   ALBUMIN 3.0*  < > 3.1* 2.5* 2.4* 2.5*  --   < > = values in this interval not displayed.  Recent Labs  11/09/15 0547 11/10/15 0656 11/22/15 1955  11/29/15 0323  11/30/15 0459 12/06/15 12/16/15 1050 12/16/15 1326  WBC 4.1 2.8* 9.5  < > 7.0  --  7.2 5.2  --  4.8  NEUTROABS 2.7 1.7 7.5  --   --   --   --   --   --   --   HGB 9.9* 9.5* 10.9*  < > 8.0*  8.1*  < > 8.7* 8.4* 12.9* 10.7*  HCT 32.3* 30.8* 34.9*  < > 25.6*  --  26.9* 27* 38.0* 32.1*  MCV 85.7 84.2 83.5  < > 82.1  --  83.3  --   --  80.9  PLT 86* 91* 119*  < > 163  --  110* 189  --  109*  < > = values in this interval not displayed. Lab Results  Component Value Date   TSH 1.050 12/21/2013   Lab Results  Component Value Date   HGBA1C 6.6 07/07/2015   Lab Results  Component Value Date   CHOL 88 12/06/2015   HDL 34* 12/06/2015   LDLCALC 45 12/06/2015   TRIG 48 12/06/2015   CHOLHDL 4.7 09/20/2011    Significant Diagnostic Results in last 30 days:  Mr Abdomen Wo Contrast  11/25/2015  CLINICAL DATA:  Evaluate pancreas mass EXAM: MRI ABDOMEN WITHOUT CONTRAST TECHNIQUE: Multiplanar multisequence MR imaging was performed without the administration of intravenous contrast. COMPARISON:  11/22/2015 FINDINGS: Exam detail diminished due to motion artifact as well as lack of IV contrast material. Lower chest:  No pleural fluid identified. Hepatobiliary: Abnormal signal of the liver in spleen are noted suggestive of hemochromatosis. No mass identified. Normal appearance of the gallbladder. No biliary dilatation identified. Pancreas: There is abnormal dilatation of the pancreatic duct from the body through the  tail. Within the head and neck of pancreas there is a and ill defined area of soft tissue infiltration which is difficult to assess due to lack of IV contrast material. This measures 5.6 cm transverse 2.9 cm AP and 5.9 cm in length. Spleen: The spleen appears enlarged measuring 16 cm in length. No focal splenic abnormality  identified. Adrenals/Urinary Tract: The adrenal glands are normal. Changes of bilateral polycystic kidneys identified. Stomach/Bowel: The stomach appears normal. No pathologic dilatation of the upper abdominal bowel loops. Vascular/Lymphatic: Aortic atherosclerosis noted. No aneurysm. The portal vein is not visualized and is presumably occluded by mass. They SMA and celiac arteries have a common origin and both appear partially encased by this mass.No retroperitoneal adenopathy identified peripancreatic lymph node is enlarged measuring 1.8 cm, image 47 of series 11. Other: No significant free fluid or abnormal fluid collections identified. Musculoskeletal: There is diffuse abnormal signal throughout the bone marrow suggestive of iron overload. IMPRESSION: 1. Poorly defined mass involving the head of pancreas and obstructing the pancreatic duct is again noted. Suspicious for pancreatic adenocarcinoma. This is difficult to further characterize above what was seen on CT from 11/22/2015. This is largely a reflection of lack of IV contrast material (due to chronic kidney disease) and motion artifact. There appears to be involvement of the celiac and SMA arteries and occlusion of the portal vein. Assuming no no allergy to IV contrast material then further assessment of this lesion with pancreas protocol CT of the abdomen would be advised followed by dialysis. 2. Abnormal signal within the bone marrow, liver and spleen is suggestive of hemochromatosis. 3. Aortic atherosclerosis 4. Bilateral end-stage polycystic kidneys. Electronically Signed   By: Kerby Moors M.D.   On: 11/25/2015 11:29   Mr Lambert Mody  Cm/mrcp  11/28/2015  CLINICAL DATA:  Patient with pancreatic head mass suspicious for adenocarcinoma. EXAM: MRI ABDOMEN WITHOUT CONTRAST  (INCLUDING MRCP) TECHNIQUE: Multiplanar multisequence MR imaging of the abdomen was performed. Heavily T2-weighted images of the biliary and pancreatic ducts were obtained, and three-dimensional MRCP images were rendered by post processing. COMPARISON:  MRI 11/25/2015; CT abdomen pelvis 11/22/2015. FINDINGS: Examination limited secondary to motion artifact and lack of intravenous contrast material. Lower chest:  No large pleural effusion. Hepatobiliary: Diffuse abnormal signal of the liver suggestive of hemochromatosis. Gallbladder is distended. Layering stones/sludge within the gallbladder lumen. No definite intrahepatic biliary ductal dilatation identified. Pancreas: Re- demonstrated dilatation of the main pancreatic duct throughout the body and tail measuring up to 11 mm. Markedly limited evaluation of the pancreas due to extensive motion artifact and lack of IV contrast. Suggestion of abnormal irregular soft tissue within the region of the pancreatic head. This is difficult to measure given the above described limitations. Spleen: Enlarged measuring approximately 16 cm. Adrenals/Urinary Tract: Normal adrenal glands. Bilateral polycystic kidneys. Stomach/Bowel: No evidence for bowel obstruction. Vascular/Lymphatic: Normal caliber abdominal aorta. No definite retroperitoneal adenopathy. The portal vein is not visualized and appears to be occluded from the suspected pancreatic head mass. Re- demonstrated enlarged 1.6 cm peripancreatic lymph node. Other: No significant free fluid in the abdomen. Musculoskeletal: Abnormal signal throughout the bone marrow suggestive of iron overload. IMPRESSION: Markedly limited examination secondary to extensive motion artifact and lack of intravenous contrast material. There is suggestion of a poorly defined mass involving the head of the  pancreas with obstruction of the pancreatic duct suspicious for pancreatic adenocarcinoma. Consider dedicated evaluation with pancreatic protocol CT if the patient is on hemodialysis and able to receive intravenous contrast material. Abnormal signal in the bone marrow, liver and spleen suggestive of hemochromatosis. Polycystic kidney disease. Electronically Signed   By: Lovey Newcomer M.D.   On: 11/28/2015 07:55    Assessment/Plan Type 2 DM Diet control continue to monitor.   Pancreatic Mass  Left upper quadrant abdominal pain persist. Awaiting biopsy appointment. Appointment scheduled previous  but patient was send to ED due to elevated potassium level. Of note  He had skipped dialysis due to not feeling well.   CHF  Stable. No shortness of breath or edema. Monitor weight.   Dyslipidemia  Recent lipid panel Wnl. Reduce Atorvastatin to 10 mg Tablet. Repeat lipid panel in 3 months.   Anemia  Obtain CBC at dialysis.   Dermatitis  Right leg skin dryness. Apply Aquaphor ointment twice daily x 4 weeks.    Family/ staff Communication: Reviewed plan of care with patient and facility Nurse supervisor  Labs/tests ordered:  CBC to be drawn during dialysis

## 2015-12-27 ENCOUNTER — Other Ambulatory Visit: Payer: Self-pay

## 2015-12-27 DIAGNOSIS — K8689 Other specified diseases of pancreas: Secondary | ICD-10-CM

## 2015-12-27 NOTE — Telephone Encounter (Signed)
I spoke with Francesca Jewett and gave her the instructions for the 01/06/16 EUS, she did confirm he has had his HD M, W,F  I will also fax a copy of the instructions to her for the chart at [facility].  [phone]

## 2015-12-29 ENCOUNTER — Encounter: Payer: Self-pay | Admitting: Interventional Cardiology

## 2016-01-04 ENCOUNTER — Encounter (HOSPITAL_COMMUNITY): Payer: Self-pay | Admitting: *Deleted

## 2016-01-04 NOTE — Progress Notes (Signed)
Preop instructions for:  Sean [name]                       Date of Birth 1935/07/19                           Date of Procedure: 01-06-2016      Doctor: Dr. Ardis Hughs Time to arrive at [facility]:845 AM Report to: Admitting Any procedure time changes, MD office will notify you!   Do not eat or drink past midnight the night before your procedure.(To include any tube feedings-must be discontinued)   Take these morning medications only with sips of water.(or give through gastrostomy or feeding tube).MIDODRINE, OMEPRAZOLE, HYDROCODONE IF NEEDED  Note: No Insulin or Diabetic meds should be given or taken the morning of the procedure!   Facility contact:  Eleanora Neighbor LPN               Phone:  [phone]                Health Care POA: PATIENT SIGNS OWN CONSENTS  Transportation contact phone#: FACILITY TO PROVIDE TRANSPORTATION  Please send day of procedure:current med list and meds last taken that day, confirm nothing by mouth status from what time, Patient Demographic info( to include DNR status, problem list, allergies)   RN contact name/phone#TONYA HOWARDS RN, [phone] EXT [phone]:                             and Fax [phone]  [employer] card and picture ID Leave all jewelry and other valuables at place where living( no metal or rings to be worn) No contact lens Women-no make-up, no lotions,perfumes,powders Men-no colognes,lotions  Any questions day of procedure,call Endoscopy unit-[phone]!   Sent from :[facility]                   [phone]                  Fax:[phone]  Sent by :[id number] Zayneb Baucum

## 2016-01-04 NOTE — Progress Notes (Signed)
FAXED ALL PRE OP INSTRUCTIONS TO Eleanora Neighbor LPN AT [facility], SPOKE WITH JAZMINE JONES LPN, ALL GFAXED INSTRUCTIONS RECEIVED AND UNDERSTOOD

## 2016-01-05 NOTE — Progress Notes (Signed)
Spoke with dr sinmger anesthesia and made aware chest xray results from 11-22-15, do not need to repeat chest xray for 7-271-7 procedure per dr singer.

## 2016-01-06 ENCOUNTER — Telehealth: Payer: Self-pay

## 2016-01-06 ENCOUNTER — Ambulatory Visit (HOSPITAL_COMMUNITY): Payer: Medicare Other | Admitting: Anesthesiology

## 2016-01-06 ENCOUNTER — Ambulatory Visit (HOSPITAL_COMMUNITY)
Admission: RE | Admit: 2016-01-06 | Discharge: 2016-01-06 | Disposition: A | Discharge status: Home / Self Care | Payer: Medicare Other | Source: Ambulatory Visit | Attending: Gastroenterology | Admitting: Gastroenterology

## 2016-01-06 ENCOUNTER — Encounter (HOSPITAL_COMMUNITY)
Admission: RE | Disposition: A | Discharge status: Home / Self Care | Payer: Self-pay | Source: Ambulatory Visit | Attending: Gastroenterology

## 2016-01-06 ENCOUNTER — Encounter (HOSPITAL_COMMUNITY): Payer: Self-pay

## 2016-01-06 DIAGNOSIS — R599 Enlarged lymph nodes, unspecified: Secondary | ICD-10-CM | POA: Insufficient documentation

## 2016-01-06 DIAGNOSIS — E1122 Type 2 diabetes mellitus with diabetic chronic kidney disease: Secondary | ICD-10-CM | POA: Insufficient documentation

## 2016-01-06 DIAGNOSIS — K869 Disease of pancreas, unspecified: Secondary | ICD-10-CM

## 2016-01-06 DIAGNOSIS — F0390 Unspecified dementia without behavioral disturbance: Secondary | ICD-10-CM | POA: Insufficient documentation

## 2016-01-06 DIAGNOSIS — E785 Hyperlipidemia, unspecified: Secondary | ICD-10-CM | POA: Diagnosis not present

## 2016-01-06 DIAGNOSIS — I2510 Atherosclerotic heart disease of native coronary artery without angina pectoris: Secondary | ICD-10-CM | POA: Diagnosis not present

## 2016-01-06 DIAGNOSIS — I120 Hypertensive chronic kidney disease with stage 5 chronic kidney disease or end stage renal disease: Secondary | ICD-10-CM | POA: Insufficient documentation

## 2016-01-06 DIAGNOSIS — N186 End stage renal disease: Secondary | ICD-10-CM | POA: Diagnosis not present

## 2016-01-06 DIAGNOSIS — C258 Malignant neoplasm of overlapping sites of pancreas: Secondary | ICD-10-CM | POA: Insufficient documentation

## 2016-01-06 DIAGNOSIS — Z89612 Acquired absence of left leg above knee: Secondary | ICD-10-CM | POA: Insufficient documentation

## 2016-01-06 DIAGNOSIS — K219 Gastro-esophageal reflux disease without esophagitis: Secondary | ICD-10-CM | POA: Diagnosis not present

## 2016-01-06 DIAGNOSIS — Z87891 Personal history of nicotine dependence: Secondary | ICD-10-CM | POA: Diagnosis not present

## 2016-01-06 DIAGNOSIS — Z8673 Personal history of transient ischemic attack (TIA), and cerebral infarction without residual deficits: Secondary | ICD-10-CM | POA: Insufficient documentation

## 2016-01-06 DIAGNOSIS — C259 Malignant neoplasm of pancreas, unspecified: Secondary | ICD-10-CM

## 2016-01-06 DIAGNOSIS — Z992 Dependence on renal dialysis: Secondary | ICD-10-CM | POA: Insufficient documentation

## 2016-01-06 DIAGNOSIS — E1142 Type 2 diabetes mellitus with diabetic polyneuropathy: Secondary | ICD-10-CM | POA: Diagnosis not present

## 2016-01-06 DIAGNOSIS — I252 Old myocardial infarction: Secondary | ICD-10-CM | POA: Insufficient documentation

## 2016-01-06 DIAGNOSIS — K8689 Other specified diseases of pancreas: Secondary | ICD-10-CM

## 2016-01-06 HISTORY — DX: Other specified diseases of pancreas: K86.89

## 2016-01-06 HISTORY — PX: EUS: SHX5427

## 2016-01-06 HISTORY — DX: Heart failure, unspecified: I50.9

## 2016-01-06 LAB — POCT I-STAT 4, (NA,K, GLUC, HGB,HCT)
GLUCOSE: 122 mg/dL — AB (ref 65–99)
HEMATOCRIT: 27 % — AB (ref 39.0–52.0)
HEMOGLOBIN: 9.2 g/dL — AB (ref 13.0–17.0)
POTASSIUM: 2.7 mmol/L — AB (ref 3.5–5.1)
SODIUM: 140 mmol/L (ref 135–145)

## 2016-01-06 SURGERY — UPPER ENDOSCOPIC ULTRASOUND (EUS) LINEAR
Anesthesia: Monitor Anesthesia Care

## 2016-01-06 MED ORDER — PROPOFOL 10 MG/ML IV BOLUS
INTRAVENOUS | Status: AC
  Filled 2016-01-06: qty 40

## 2016-01-06 MED ORDER — PROPOFOL 10 MG/ML IV BOLUS
INTRAVENOUS | Status: DC | PRN
  Administered 2016-01-06: 30 mg via INTRAVENOUS
  Administered 2016-01-06: 20 mg via INTRAVENOUS
  Administered 2016-01-06 (×2): 10 mg via INTRAVENOUS
  Administered 2016-01-06: 30 mg via INTRAVENOUS

## 2016-01-06 MED ORDER — SODIUM CHLORIDE 0.9 % IV SOLN
INTRAVENOUS | Status: DC
  Administered 2016-01-06: 12:00:00 via INTRAVENOUS
  Administered 2016-01-06: 500 mL via INTRAVENOUS

## 2016-01-06 MED ORDER — PHENYLEPHRINE 40 MCG/ML (10ML) SYRINGE FOR IV PUSH (FOR BLOOD PRESSURE SUPPORT)
PREFILLED_SYRINGE | INTRAVENOUS | Status: AC
  Filled 2016-01-06: qty 10

## 2016-01-06 MED ORDER — PROPOFOL 500 MG/50ML IV EMUL
INTRAVENOUS | Status: DC | PRN
  Administered 2016-01-06: 50 ug/kg/min via INTRAVENOUS

## 2016-01-06 MED ORDER — LIDOCAINE HCL (CARDIAC) 20 MG/ML IV SOLN
INTRAVENOUS | Status: AC
  Filled 2016-01-06: qty 5

## 2016-01-06 MED ORDER — POTASSIUM CHLORIDE 10 MEQ/100ML IV SOLN
10.0000 meq | Freq: Once | INTRAVENOUS | Status: AC
  Administered 2016-01-06: 10 meq via INTRAVENOUS
  Filled 2016-01-06: qty 100

## 2016-01-06 MED ORDER — PHENYLEPHRINE HCL 10 MG/ML IJ SOLN
INTRAMUSCULAR | Status: DC | PRN
  Administered 2016-01-06 (×5): 40 ug via INTRAVENOUS

## 2016-01-06 MED ORDER — PROPOFOL 10 MG/ML IV BOLUS
INTRAVENOUS | Status: AC
  Filled 2016-01-06: qty 20

## 2016-01-06 NOTE — Discharge Instructions (Signed)

## 2016-01-06 NOTE — OR Nursing (Addendum)
Pt. Only able to tolerate half the bag, of potassium,50cc.  Pt. In pain and demanded to stop the drip.  Pt. Educated on importance of medication and still wished to stop the drip.  Dr. Oren Bracket made aware.  Will report this to care giver at [facility].    Laverta Baltimore, RN

## 2016-01-06 NOTE — Transfer of Care (Signed)
Immediate Anesthesia Transfer of Care Note  Patient: Sean [name]  Procedure(s) Performed: Procedure(s): UPPER ENDOSCOPIC ULTRASOUND (EUS) LINEAR (N/A)  Patient Location: PACU  Anesthesia Type:MAC  Level of Consciousness: awake, alert  and oriented  Airway & Oxygen Therapy: Patient Spontanous Breathing and Patient connected to nasal cannula oxygen  Post-op Assessment: Report given to RN and Post -op Vital signs reviewed and stable  Post vital signs: Reviewed and stable  Last Vitals:  Vitals:   01/06/16 1003 01/06/16 1218  BP: (!) 146/70   Pulse: 89 87  Resp: 13 17  Temp: 37.1 C     Last Pain:  Vitals:   01/06/16 1003  TempSrc: Oral         Complications: No apparent anesthesia complications

## 2016-01-06 NOTE — Telephone Encounter (Signed)
-----   Message from Milus Banister, MD sent at 01/06/2016 12:29 PM EDT -----   Chong Sicilian, He needs referral to medical oncology for newly diagnosed, locally advanced (non resectable) pancreatic adenocarcinoma.  thanks

## 2016-01-06 NOTE — Op Note (Signed)
[facility] Patient Name: Sean [name] Procedure Date: 01/06/2016 MRN: [id number] Attending MD: Milus Banister , MD Date of Birth: Sep 21, 1935 CSN: [id number] Age: 80 Admit Type: Outpatient Procedure:                Upper EUS Indications:              Suspected mass in pancreas on MRI Providers:                Milus Banister, MD, [facility], CRNA,                            Carolynn Comment, RN, Alfonso Patten, Technician Referring MD:             Zenovia Jarred, MD Medicines:                Monitored Anesthesia Care Complications:            No immediate complications. Estimated blood loss:                            None. Estimated Blood Loss:     Estimated blood loss: none. Procedure:                Pre-Anesthesia Assessment:                           - Prior to the procedure, a History and Physical                            was performed, and patient medications and                            allergies were reviewed. The patient's tolerance of                            previous anesthesia was also reviewed. The risks                            and benefits of the procedure and the sedation                            options and risks were discussed with the patient.                            All questions were answered, and informed consent                            was obtained. Prior Anticoagulants: The patient has                            taken no previous anticoagulant or antiplatelet                            agents. ASA Grade Assessment: III - A patient with  severe systemic disease. After reviewing the risks                            and benefits, the patient was deemed in                            satisfactory condition to undergo the procedure.                           After obtaining informed consent, the endoscope was                            passed under direct vision. Throughout the    procedure, the patient's blood pressure, pulse, and                            oxygen saturations were monitored continuously. The                            [id number]) scope was introduced through                            the mouth, and advanced to the second part of                            duodenum. The [id number]) scope was                            introduced through the mouth, and advanced to the                            second part of duodenum. The upper EUS was                            accomplished without difficulty. The patient                            tolerated the procedure well. Scope In: Scope Out: Findings:      Endoscopic Finding (limited views with radial and linear echoendoscopes)       The examined esophagus was endoscopically normal.      The entire examined stomach was endoscopically normal.      The examined duodenum was endoscopically normal.      Endosonographic Finding :      1. An irregular mass was identified in the pancreatic head, neck. The       mass was hypoechoic and heterogenous. The mass measured at least 35 mm       in maximal cross-sectional diameter. The endosonographic borders were       poorly-defined, it appears to encase the celiac trunk, SMA (on this and       on recent CT scan). The mass is obstructing the main pancreatic duct,       with resultant upstream dilation (6mm in body). Fine needle aspiration       for cytology was performed. Color Doppler imaging was utilized prior to  needle puncture to confirm a lack of significant vascular structures       within the needle path. Three passes were made with the 25 gauge needle       using a transgastric approach. Some passes were made with a stylet. A       cytotechnologist was present to evaluate the adequacy of the specimen.      2. Scattered celiac adenopathy (suspcious, measuring up to 7-39mm).      3. Limited views of the liver, spleen were normal.      4.  CBD was normal, non-dilated. Impression:               - 3.5cm poorly defined mass that clearly involves                            SMA, celiac trunk on this and recent CT, causes                            main pancreatic duct dilation but not biliary                            obstruction, + suspicious celiac adenopathy.                            Preliminary cytology review is positive for                            malignancy (adenocarcinoma). Await final pathology. Moderate Sedation:      N/A- Per Anesthesia Care Recommendation:           - Discharge patient to home (ambulatory).                           - Referral to medical oncology. Procedure Code(s):        --- Professional ---                           [phone], Esophagogastroduodenoscopy, flexible,                            transoral; with transendoscopic ultrasound-guided                            intramural or transmural fine needle                            aspiration/biopsy(s), (includes endoscopic                            ultrasound examination limited to the esophagus,                            stomach or duodenum, and adjacent structures) Diagnosis Code(s):        --- Professional ---                           K86.89, Other specified diseases of pancreas  R93.3, Abnormal findings on diagnostic imaging of                            other parts of digestive tract CPT copyright 2016 American Medical Association. All rights reserved. The codes documented in this report are preliminary and upon coder review may  be revised to meet current compliance requirements. Milus Banister, MD 01/06/2016 12:27:36 PM This report has been signed electronically. Number of Addenda: 0

## 2016-01-06 NOTE — H&P (Signed)
HPI: This is a man with very abnormal pancreas, elevated CA 19-9  Chief complaint is abnormal pancreas   Past Medical History:  Diagnosis Date  . Arthritis   . CAD (coronary artery disease)    a. s/p NSTEMI and failed RCA PCI in 2011. b. Nuc 12/2013: nonischemic, prior inferior MI.  . Calciphylaxis    a. Multiple admissions for nonhealing L ulcer/sepsis, s/p L AKA 02/02/2014.  Marland Kitchen CHF (congestive heart failure) ([place])   . Cholelithiasis 01/2014   acute cholecystitis managed with perc drain due to high surgical risk.   . Chronic anemia   . Dementia   . Depression with anxiety   . Dialysis patient [facility])    M, W, F; [place] ESRD (end stage renal disease) on dialysis ([place])   . GERD (gastroesophageal reflux disease)   . Heart attack ([place]) 2011; 1990's  . History of left bundle branch block   . Hx of above knee amputation ([place])    left  . Hyperlipidemia   . Hyperparathyroidism   . Hypertension   . Hypotension    renovascular  . LBBB (left bundle branch block)   . Migraine    none recent  . Pancreatic mass   . Peripheral neuropathy ([place])   . Renal insufficiency   . Stroke [facility])    several, last cva 2015 or 2014  . Type II diabetes mellitus ([place])    not taking any medications.    Past Surgical History:  Procedure Laterality Date  . AMPUTATION Left 02/02/2014   Procedure: AMPUTATION ABOVE KNEE-LEFT;  Surgeon: Elam Dutch, MD;  Location: [place];  Service: Vascular;  Laterality: Left;  . APPLICATION OF WOUND VAC Left 03/31/2014   Procedure: APPLICATION OF WOUND VAC - LEFT AKA;  Surgeon: Elam Dutch, MD;  Location: [place];  Service: Vascular;  Laterality: Left;  . CAPD INSERTION  09/12/2011   Procedure: LAPAROSCOPIC INSERTION CONTINUOUS AMBULATORY PERITONEAL DIALYSIS  (CAPD) CATHETER;  Surgeon: Edward Jolly, MD;  Location: [place];  Service: General;  Laterality: N/A;  . CAPD REMOVAL  03/12/2012   Procedure: CONTINUOUS AMBULATORY PERITONEAL DIALYSIS  (CAPD)  CATHETER REMOVAL;  Surgeon: Edward Jolly, MD;  Location: [place];  Service: General;  Laterality: N/A;  . COLON SURGERY  2011   colon resection   . COLONOSCOPY WITH PROPOFOL N/A 07/14/2014   Procedure: COLONOSCOPY WITH PROPOFOL;  Surgeon: Jerene Bears, MD;  [facility];  Service: Gastroenterology;  Laterality: N/A;  . DG AV DIALYSIS SHUNT INTRO NDL*R* OR     "not working" (09/19/11)  . ESOPHAGOGASTRODUODENOSCOPY Left 11/28/2015   Procedure: ESOPHAGOGASTRODUODENOSCOPY (EGD);  Surgeon: Juanita Craver, MD;  Location: [facility] ENDOSCOPY;  Service: Endoscopy;  Laterality: Left;  . I&D EXTREMITY Left 03/31/2014   Procedure: IRRIGATION AND DEBRIDEMENT EXTREMITY - LEFT ABOVE KNEE AMPUTATION;  Surgeon: Elam Dutch, MD;  Location: [place];  Service: Vascular;  Laterality: Left;  . PARATHYROIDECTOMY  2011  . SP AV DIALYSIS SHUNT ACCESS EXISTING *L*  2003    Current Facility-Administered Medications  Medication Dose Route Frequency Provider Last Rate Last Dose  . 0.9 %  sodium chloride infusion   Intravenous Continuous Milus Banister, MD 20 mL/hr at 01/06/16 1049 500 mL at 01/06/16 1049  . potassium chloride 10 mEq in 100 mL IVPB  10 mEq Intravenous Once Roderic Palau, MD   10 mEq at 01/06/16 1010    Allergies as of 12/27/2015 - Review Complete 12/23/2015  Allergen Reaction Noted  . Benazepril hcl Hives, Itching, and Rash     Family History  Problem Relation Age of Onset  . Cancer Sister     breast  . Cancer Brother     lung  . Anesthesia problems Neg Hx   . Hypotension Neg Hx   . Malignant hyperthermia Neg Hx   . Pseudochol deficiency Neg Hx     Social History   Social History  . Marital status: Married    Spouse name: N/A  . Number of children: N/A  . Years of education: N/A   Occupational History  . Not on file.   Social History Main Topics  . Smoking status: Former Smoker    Packs/day: 0.25    Years: 7.00    Types: Cigarettes    Quit date: 06/12/1976  .  Smokeless tobacco: Never Used  . Alcohol use No  . Drug use: No  . Sexual activity: No   Other Topics Concern  . Not on file   Social History Narrative  . No narrative on file     Physical Exam: BP (!) 146/70   Pulse 89   Temp 98.7 F (37.1 C) (Oral)   Resp 13   Ht 5\' 5"  (1.651 m)   Wt 145 lb (65.8 kg)   SpO2 100%   BMI 24.13 kg/m  Constitutional: generally well-appearing Psychiatric: alert and oriented x3 Abdomen: soft, nontender, nondistended, no obvious ascites, no peritoneal signs, normal bowel sounds   Assessment and plan: 80 y.o. male with abnormal pancreas, elevated CA 19-9  Will proceed with EUS, FNA today   Owens Loffler, MD [facility] 01/06/2016, 11:09 AM

## 2016-01-06 NOTE — Anesthesia Postprocedure Evaluation (Signed)
Anesthesia Post Note  Patient: Sean [name]  Procedure(s) Performed: Procedure(s) (LRB): UPPER ENDOSCOPIC ULTRASOUND (EUS) LINEAR (N/A)  Patient location during evaluation: PACU Anesthesia Type: MAC Level of consciousness: awake and alert Pain management: pain level controlled Vital Signs Assessment: post-procedure vital signs reviewed and stable Respiratory status: spontaneous breathing, nonlabored ventilation and respiratory function stable Cardiovascular status: stable and blood pressure returned to baseline Anesthetic complications: no    Last Vitals:  Vitals:   01/06/16 1320 01/06/16 1330  BP: (!) 136/41   Pulse: 81 79  Resp: 15 11  Temp:      Last Pain:  Vitals:   01/06/16 1003  TempSrc: Oral                 Caelin Rayl,W. EDMOND

## 2016-01-06 NOTE — Telephone Encounter (Signed)
The referral has been made to Med Onc.  I will follow up with appt confirmation.

## 2016-01-06 NOTE — Anesthesia Preprocedure Evaluation (Addendum)
Anesthesia Evaluation  Patient identified by MRN, date of birth, ID band Patient awake    Reviewed: Allergy & Precautions, H&P , NPO status , Patient's Chart, lab work & pertinent test results  Airway Mallampati: III  TM Distance: >3 FB Neck ROM: Full    Dental no notable dental hx. (+) Upper Dentures, Edentulous Lower, Dental Advisory Given   Pulmonary neg pulmonary ROS, former smoker,    Pulmonary exam normal breath sounds clear to auscultation       Cardiovascular hypertension, + Past MI, + Peripheral Vascular Disease and +CHF  + dysrhythmias  Rhythm:Regular Rate:Normal     Neuro/Psych  Headaches, Anxiety Depression CVA negative psych ROS   GI/Hepatic Neg liver ROS, GERD  Medicated and Controlled,  Endo/Other  diabetes  Renal/GU ESRF and DialysisRenal disease  negative genitourinary   Musculoskeletal  (+) Arthritis ,   Abdominal   Peds  Hematology negative hematology ROS (+) anemia ,   Anesthesia Other Findings   Reproductive/Obstetrics negative OB ROS                           Anesthesia Physical Anesthesia Plan  ASA: III  Anesthesia Plan: MAC   Post-op Pain Management:    Induction: Intravenous  Airway Management Planned: Nasal Cannula  Additional Equipment:   Intra-op Plan:   Post-operative Plan:   Informed Consent: I have reviewed the patients History and Physical, chart, labs and discussed the procedure including the risks, benefits and alternatives for the proposed anesthesia with the patient or authorized representative who has indicated his/her understanding and acceptance.   Dental advisory given  Plan Discussed with: CRNA  Anesthesia Plan Comments:         Anesthesia Quick Evaluation

## 2016-01-07 ENCOUNTER — Encounter (HOSPITAL_COMMUNITY): Payer: Self-pay | Admitting: Gastroenterology

## 2016-01-12 ENCOUNTER — Telehealth: Payer: Self-pay | Admitting: *Deleted

## 2016-01-12 NOTE — Telephone Encounter (Signed)
Oncology Nurse Navigator Documentation  Oncology Nurse Navigator Flowsheets 01/12/2016  Navigator Location [facility]Med Onc  Navigator Encounter Type Introductory phone call  Spoke with daughter, [name] and provided new patient appointment for 01/14/16 at 1:45 pm with Dr. Ma Rings. Informed of location of [place], valet service, and registration process. Informed her that this RN has contacted SNF and they are aware of the appointment and will transport him. Spoke with Lovie at SNF: confirmed his dialysis is over at 10:25 and he usually does well with it. Will have him come back and have lunch and then go to appointment at [facility]. Made aware of location of facility and to send insurance information, H & P, and his MAR. Provided both parties navigator direct contact #. Notified HIM to enter appointment.

## 2016-01-14 ENCOUNTER — Telehealth: Payer: Self-pay | Admitting: Oncology

## 2016-01-14 ENCOUNTER — Ambulatory Visit (HOSPITAL_BASED_OUTPATIENT_CLINIC_OR_DEPARTMENT_OTHER): Payer: Medicare Other | Admitting: Oncology

## 2016-01-14 ENCOUNTER — Encounter: Payer: Self-pay | Admitting: *Deleted

## 2016-01-14 DIAGNOSIS — Z992 Dependence on renal dialysis: Secondary | ICD-10-CM

## 2016-01-14 DIAGNOSIS — C250 Malignant neoplasm of head of pancreas: Secondary | ICD-10-CM

## 2016-01-14 DIAGNOSIS — D649 Anemia, unspecified: Secondary | ICD-10-CM | POA: Diagnosis not present

## 2016-01-14 DIAGNOSIS — N186 End stage renal disease: Secondary | ICD-10-CM

## 2016-01-14 DIAGNOSIS — R634 Abnormal weight loss: Secondary | ICD-10-CM

## 2016-01-14 DIAGNOSIS — R630 Anorexia: Secondary | ICD-10-CM

## 2016-01-14 MED ORDER — MORPHINE SULFATE ER 15 MG PO TBCR: 15.0000 mg | EXTENDED_RELEASE_TABLET | Freq: Two times a day (BID) | ORAL | 0 refills | Status: DC

## 2016-01-14 NOTE — Progress Notes (Signed)
[facility] Patient Consult   Referring MD: Emmons Toth 80 y.o.  10-May-1936    Reason for Referral: Pancreas cancer   HPI: Mr. Burkitt reports a history of abdominal pain for the past 2-3 months. He was admitted in May and diagnosed with "gallstone pancreatitis and a partial small bowel obstruction.. A CT 11/07/2015 revealed evidence of a small bowel obstruction with a transition point in the right mid abdomen. Soft tissue fullness was noted about the entire pancreas.  His symptoms improved while hospitalized.  He was readmitted in June with C. difficile colitis. He had persistent abdominal pain. A repeat CT of the abdomen 11/22/2015 revealed mild enlargement of the spleen. Thickening of the pancreas head was noted with adenopathy at the gastrohepatic ligament. An MRI of the abdomen 11/27/2015 revealed a dilated main pancreatic duct with suggestion of irregular soft tissue within the pancreatic head. He had an episode of coffee ground emesis and was referred to Dr. Collene Mares. He underwent an upper endoscopy by Dr. Collene Mares on 11/28/2015. An ulcer was noted in the duodenal bulb. No bleeding.  He was referred to Dr. Ardis Hughs and was taken to the endoscopic ultrasound procedure on 01/06/2016. The duodenum appeared normal. A mass was identified in the pancreas head/neck measuring at least 35 mm. The mass was noted to encase the celiac trunk and superior mesenteric artery. The mass obstructs the main pancreatic duct. A fine-needle laceration biopsy was performed. Scattered celiac adenopathy. The cytology ([id number]) revealed adenocarcinoma. On 11/25/2015 the CA 19-9 returned elevated at 2,662.  Mr. Farrior has persistent abdominal pain. The pain is partially relieved with hydrocodone.  Past Medical History:  Diagnosis Date  . Arthritis   . CAD (coronary artery disease)    a. s/p NSTEMI and failed RCA PCI in 2011. b. Nuc 12/2013: nonischemic, prior inferior MI.  .  Calciphylaxis    a. Multiple admissions for nonhealing L ulcer/sepsis, s/p L AKA 02/02/2014.  Marland Kitchen CHF (congestive heart failure) ([place])   . Cholelithiasis 01/2014   acute cholecystitis managed with perc drain due to high surgical risk.   . Chronic anemia   . Dementia   . Depression with anxiety   . Dialysis patient [facility])    M, W, F; [place] ESRD (end stage renal disease) on dialysis ([place])   . GERD (gastroesophageal reflux disease)   . Heart attack ([place]) 2011; 1990's  . History of left bundle branch block   . Hx of above knee amputation ([place])    left  . Hyperlipidemia   . Hyperparathyroidism   . Hypertension   . Hypotension    renovascular  . LBBB (left bundle branch block)   . Migraine    none recent  . Pancreatic Cancer  July 2017   . Peripheral neuropathy ([place])   . Renal insufficiency   . Stroke [facility])    several, last cva 2015 or 2014  . Type II diabetes mellitus ([place])    not taking any medications.    .  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 AMPUTATION Left 02/02/2014   Procedure: AMPUTATION ABOVE KNEE-LEFT;  Surgeon: Elam Dutch, MD;  Location: [place];  Service: Vascular;  Laterality: Left;  . APPLICATION OF WOUND VAC Left 03/31/2014   Procedure: APPLICATION OF WOUND VAC - LEFT AKA;  Surgeon: Elam Dutch, MD;  Location: [place];  Service: Vascular;  Laterality: Left;  .  CAPD INSERTION  09/12/2011   Procedure: LAPAROSCOPIC INSERTION CONTINUOUS AMBULATORY PERITONEAL DIALYSIS  (CAPD) CATHETER;  Surgeon: Edward Jolly, MD;  Location: [place];  Service: General;  Laterality: N/A;  . CAPD REMOVAL  03/12/2012   Procedure: CONTINUOUS AMBULATORY PERITONEAL DIALYSIS  (CAPD) CATHETER REMOVAL;  Surgeon: Edward Jolly, MD;  Location: [place];  Service: General;  Laterality: N/A;  . COLON SURGERY  2011   colon resection   . COLONOSCOPY WITH PROPOFOL N/A 07/14/2014   Procedure: COLONOSCOPY WITH PROPOFOL;  Surgeon: Jerene Bears, MD;  [facility];  Service: Gastroenterology;  Laterality: N/A;  . DG AV DIALYSIS SHUNT INTRO NDL*R* OR     "not working" (09/19/11)  . ESOPHAGOGASTRODUODENOSCOPY Left 11/28/2015   Procedure: ESOPHAGOGASTRODUODENOSCOPY (EGD);  Surgeon: Juanita Craver, MD;  Location: [facility] ENDOSCOPY;  Service: Endoscopy;  Laterality: Left;  . EUS N/A 01/06/2016   Procedure: UPPER ENDOSCOPIC ULTRASOUND (EUS) LINEAR;  Surgeon: Milus Banister, MD;  [facility];  Service: Endoscopy;  Laterality: N/A;  . I&D EXTREMITY Left 03/31/2014   Procedure: IRRIGATION AND DEBRIDEMENT EXTREMITY - LEFT ABOVE KNEE AMPUTATION;  Surgeon: Elam Dutch, MD;  Location: [place];  Service: Vascular;  Laterality: Left;  . PARATHYROIDECTOMY  2011  . SP AV DIALYSIS SHUNT ACCESS EXISTING *L*  2003    Medications: Reviewed  Allergies:  Allergies  Allergen Reactions  . Benazepril Hcl Hives, Itching and Rash    flareups on patient head    Family history: A brother had multiple myeloma. A sister had breast cancer. A sister had lung cancer. No other family history of cancer.  Social History:   He lives in a skilled nursing facility in [place]. 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 He quit smoking cigarettes 40 years ago. He drinks wine occasionally. No transfusion history. No risk factor for HIV or hepatitis.   ROS:   Positives include: Abdominal pain, low back pain, a neuropathic, left arm pruritus with a dry rash one week ago, right eye vision loss, altered taste, solid dysphagia  A complete ROS was otherwise negative.  Physical Exam:  Blood pressure (!) 119/57, pulse 92, temperature 99.1 F (37.3 C), temperature source Oral, resp. rate 17, height 5' 5"  (1.651 m), weight 144 lb 4.8 oz (65.5 kg), SpO2 100 %.  HEENT: Edentulous, oropharynx without visible mass Lungs: Decreased breath sounds with rhonchi at the left lower posterior chest, no respiratory distress Cardiac: Regular  rate and rhythm Abdomen: No hepatosplenomegaly, no apparent ascites, no mass, tender in the mid lower abdomen GU: Testes without mass  Vascular: Chronic stasis change with trace edema at the right lower leg Lymph nodes: No cervical, supra-clavicular, axillary, or inguinal nodes Neurologic: Alert and oriented, the motor exam appears intact in the upper and lower extremities, slight weakness of the left arm and hand Skin: Chronic stasis rash at the right lower leg Musculoskeletal: Mild tenderness in the low paraspinous region, no mass    Imaging: As per history of present illness, CT and MRI images reviewed   Assessment/Plan:   1. Pancreas cancer, adenocarcinoma, pancreas head mass, status post an EUS biopsy on 01/06/2016  EUS 01/06/2016 revealed a 3.57 m mass with involvement of the SMA and celiac trunk, suspicious iliac adenopathy  MRI abdomen 11/27/2015 revealed a poorly defined head of the pancreas mass, enlarged peripancreatic lymph node 2. End-stage renal disease on hemodialysis  3.   Status post left AKA  4.   History of  coronary artery disease  5.   Anemia  6.   Anorexia/weight loss secondary to #1  7.   History of C. difficile colitis  8.   Duodenal ulcer noted on endoscopy 11/28/2015-invasion of the duodenum by the pancreas mass?   Disposition:   Mr. Kloos has been diagnosed with pancreas cancer. He appears to have locally advanced disease based on the staging evaluation to date. He has multiple comorbid conditions. He is not a candidate for surgery.  Mr. Bellamy is symptomatic with pain and anorexia. It is possible the episode of coffee-ground emesis was related to the pancreas mass.  I discussed treatment options with Mr. Gahm and his daughters. No therapy will be curative. We discussed chemotherapy versus comfort care. He agrees to a comfort care approach.  [facility]. We discussed CPR and ACLS issues. He would  like to remain on a full CODE STATUS for now. He plans to continue hemodialysis.  We added low-dose MS Contin for pain. He will continue hydrocodone as needed.  He would like to liberalize his diet and have a glass of wine each evening.  Mr. Buczynski will return for an office visit in 4 weeks. We are available to see him in the interim as needed.  Approximately 50 minutes were spent with the patient today. The majority of the time was used for counseling and coordination of care.  Betsy Coder, MD  01/14/2016, 4:42 PM

## 2016-01-14 NOTE — Telephone Encounter (Signed)
Gave pt cal & avs °

## 2016-01-14 NOTE — Progress Notes (Signed)
Oncology Nurse Navigator Documentation  Oncology Nurse Navigator Flowsheets 01/14/2016  Navigator Location [facility]  Navigator Encounter Type Initial MedOnc  Abnormal Finding Date 11/22/2015  Confirmed Diagnosis Date 01/06/2016  Patient Visit Type MedOnc;Initial  Treatment Phase Other--Hospice/Supportive Care  Barriers/Navigation Needs Family concerns;Coordination of Care;Education  Education Actor Options;Coping with Diagnosis/ Prognosis;Pain/ Symptom Management  Interventions Coordination of Care;Education Method  Coordination of Care Hospice/Palliative Care--call [facility] for referral. Dr. Benay Spice will be attending and standing orders OK'd. Cleared with Tammy, RN at facility that Hospice is allowed to assist in care at their facility.  Education Method Verbal;Teach-back  Acuity Level 2  Time Spent with Patient 60  Met with patient and daughters, Choona and Chinita during new patient visit. Explained the role of the GI Nurse Navigator and provided New Patient Packet with information on: 1.  Pancreas cancer--CEA level, anatomy of pancreas Answered questions, reviewed current supportive care plan using TEACH back and provided emotional support. [name] wishes for comfort/supportive care based on his diagnosis and other comorbidity. He wants to be able to eat what he wants, have a glass of wine in the evening, but will continue dialysis at this time. He has discussed his wishes with family and his son, Lynnell Chad who is the POA. At this time he wants to have resuscitation attempted in limited manner-is working on these documents now with his son. He will continue to reside at the SNF according to family. Family is familiar with Hospice since their mother died of lung cancer with hospice care in home. Dr. Benay Spice will communicate diet requests to Dr. Arty Baumgartner (renal) for his final approval. Faxed office note to facility [phone].   Merceda Elks, RN, BSN GI  Oncology [place]

## 2016-01-19 ENCOUNTER — Non-Acute Institutional Stay (SKILLED_NURSING_FACILITY): Payer: Medicare Other | Admitting: Internal Medicine

## 2016-01-19 ENCOUNTER — Encounter: Payer: Self-pay | Admitting: Internal Medicine

## 2016-01-19 DIAGNOSIS — C250 Malignant neoplasm of head of pancreas: Secondary | ICD-10-CM

## 2016-01-19 DIAGNOSIS — R1114 Bilious vomiting: Secondary | ICD-10-CM | POA: Diagnosis not present

## 2016-01-19 DIAGNOSIS — N186 End stage renal disease: Secondary | ICD-10-CM

## 2016-01-19 DIAGNOSIS — Z7189 Other specified counseling: Secondary | ICD-10-CM

## 2016-01-19 DIAGNOSIS — Z992 Dependence on renal dialysis: Secondary | ICD-10-CM | POA: Diagnosis not present

## 2016-01-19 DIAGNOSIS — R1013 Epigastric pain: Secondary | ICD-10-CM | POA: Diagnosis not present

## 2016-01-19 NOTE — Progress Notes (Signed)
LOCATION: [name]  PCP: [name], MD   Code Status: Full Code  Goals of care: Advanced Directive information Advanced Directives 01/14/2016  Does patient have an advance directive? Yes  Type of Advance Directive [name]  Does patient want to make changes to advanced directive? -  Copy of advanced directive(s) in chart? No - copy requested  Would patient like information on creating an advanced directive? -  Pre-existing out of facility DNR order (yellow form or pink MOST form) -       Extended Emergency Contact Information Primary Emergency Contact: [facility] Address: [address]          [name], [place] [address] [name] of [place] Phone: [phone] Relation: Daughter Secondary Emergency Contact: [facility] Address: [address]          [place], [place] [address] [name] Mobile Phone: [phone] Relation: Son   Allergies  Allergen Reactions  . Benazepril Hcl Hives, Itching and Rash    flareups on patient head    Chief Complaint  Patient presents with  . Medical Management of Chronic Issues    Routine Visit     HPI:  Patient is a 80 y.o. male seen today for routine visit. He has been diagnosed with locally advanced pancreatic cancer and was seen by his oncologist yesterday. He has been having nausea and vomiting x 1 day with mainly bilious content. He received zofran with some relief. On chart review, his MS contin has been a new medication. He has refused to go for HD today as he does not feel good.    Review of Systems:  Constitutional: Negative for fever, chills, diaphoresis. He feels weak and tired HENT: Negative for headache, congestion Eyes: Negative for blurred vision, double vision and discharge. Wears glasses. Respiratory: Negative for cough, shortness of breath and wheezing.   Cardiovascular: Negative for chest pain, palpitations, leg swelling.  Gastrointestinal:  positive for heartburn, nausea, vomiting, abdominal pain.   Genitourinary: he is anuric.  Musculoskeletal: Negative for back pain, fall in the facility.  Skin: Negative for itching, rash.  Neurological: Negative for dizziness. Psychiatric/Behavioral: Negative for depression   Past Medical History:  Diagnosis Date  . Arthritis   . CAD (coronary artery disease)    a. s/p NSTEMI and failed RCA PCI in 2011. b. Nuc 12/2013: nonischemic, prior inferior MI.  . Calciphylaxis    a. Multiple admissions for nonhealing L ulcer/sepsis, s/p L AKA 02/02/2014.  Marland Kitchen CHF (congestive heart failure) ([place])   . Cholelithiasis 01/2014   acute cholecystitis managed with perc drain due to high surgical risk.   . Chronic anemia   . Dementia   . Depression with anxiety   . Dialysis patient [facility])    M, W, F; [facility] ESRD (end stage renal disease) on dialysis ([place])   . GERD (gastroesophageal reflux disease)   . Heart attack ([place]) 2011; 1990's  . History of left bundle branch block   . Hx of above knee amputation ([place])    left  . Hyperlipidemia   . Hyperparathyroidism   . Hypertension   . Hypotension    renovascular  . LBBB (left bundle branch block)   . Migraine    none recent  . Pancreatic mass   . Peripheral neuropathy ([place])   . Renal insufficiency   . Stroke [facility])    several, last cva 2015 or 2014  . Type II diabetes mellitus ([place])  not taking any medications.   Past Surgical History:  Procedure Laterality Date  . AMPUTATION Left 02/02/2014   Procedure: AMPUTATION ABOVE KNEE-LEFT;  Surgeon: Elam Dutch, MD;  Location: [place];  Service: Vascular;  Laterality: Left;  . APPLICATION OF WOUND VAC Left 03/31/2014   Procedure: APPLICATION OF WOUND VAC - LEFT AKA;  Surgeon: Elam Dutch, MD;  Location: [place];  Service: Vascular;  Laterality: Left;  . CAPD INSERTION  09/12/2011   Procedure: LAPAROSCOPIC INSERTION CONTINUOUS AMBULATORY PERITONEAL DIALYSIS  (CAPD) CATHETER;  Surgeon:  Edward Jolly, MD;  Location: [place];  Service: General;  Laterality: N/A;  . CAPD REMOVAL  03/12/2012   Procedure: CONTINUOUS AMBULATORY PERITONEAL DIALYSIS  (CAPD) CATHETER REMOVAL;  Surgeon: Edward Jolly, MD;  Location: [place];  Service: General;  Laterality: N/A;  . COLON SURGERY  2011   colon resection   . COLONOSCOPY WITH PROPOFOL N/A 07/14/2014   Procedure: COLONOSCOPY WITH PROPOFOL;  Surgeon: Jerene Bears, MD;  Location: [facility];  Service: Gastroenterology;  Laterality: N/A;  . DG AV DIALYSIS SHUNT INTRO NDL*R* OR     "not working" (09/19/11)  . ESOPHAGOGASTRODUODENOSCOPY Left 11/28/2015   Procedure: ESOPHAGOGASTRODUODENOSCOPY (EGD);  Surgeon: Juanita Craver, MD;  Location: [facility] ENDOSCOPY;  Service: Endoscopy;  Laterality: Left;  . EUS N/A 01/06/2016   Procedure: UPPER ENDOSCOPIC ULTRASOUND (EUS) LINEAR;  Surgeon: Milus Banister, MD;  Location: [facility];  Service: Endoscopy;  Laterality: N/A;  . I&D EXTREMITY Left 03/31/2014   Procedure: IRRIGATION AND DEBRIDEMENT EXTREMITY - LEFT ABOVE KNEE AMPUTATION;  Surgeon: Elam Dutch, MD;  Location: [place];  Service: Vascular;  Laterality: Left;  . PARATHYROIDECTOMY  2011  . SP AV DIALYSIS SHUNT ACCESS EXISTING *L*  2003     Medications:   Medication List       Accurate as of 01/19/16  3:11 PM. Always use your most recent med list.          acetaminophen 325 MG tablet Commonly known as:  TYLENOL Take 650 mg by mouth every 6 (six) hours as needed for mild pain, fever or headache (DO NOT EXCEED 4 GM OF APAP IN 24 HOURS FORM ALL SOURCES).   atorvastatin 20 MG tablet Commonly known as:  LIPITOR Take 10 mg by mouth at bedtime.   b-complex w/C Tabs tablet Take 1 tablet by mouth at bedtime.   docusate sodium 100 MG capsule Commonly known as:  COLACE Take 100 mg by mouth daily.   feeding supplement (PRO-STAT SUGAR FREE 64) Liqd Take 30 mLs by mouth 2 (two) times daily.   gabapentin 100 MG capsule Commonly known as:   NEURONTIN Take 100 mg by mouth at bedtime.   HYDROcodone-acetaminophen 10-325 MG tablet Commonly known as:  NORCO Take 1 tablet by mouth every 4 (four) hours as needed for moderate pain.   midodrine 5 MG tablet Commonly known as:  PROAMATINE Take 5 mg by mouth 3 (three) times daily. Do not take 6 am dose on M- W- F- d/t  dialysis days.   morphine 15 MG 12 hr tablet Commonly known as:  MS CONTIN Take 1 tablet (15 mg total) by mouth every 12 (twelve) hours.   sevelamer carbonate 800 MG tablet Commonly known as:  RENVELA Take 800 mg by mouth daily with lunch. 12 pm and 5 pm.   sorbitol 70 % solution Take 30 mLs by mouth daily as needed (severe constipation).   ZOFRAN 4 MG tablet Generic drug:  ondansetron Take 4 mg by mouth every 6 (six) hours as needed for nausea.       Immunizations: Immunization History  Administered Date(s) Administered  . Influenza-Unspecified 04/03/2014, 04/19/2015  . PPD Test 04/03/2014, 04/17/2014, 02/16/2015  . Pneumococcal Polysaccharide-23 02/08/2014  . Pneumococcal-Unspecified 04/03/2014     Physical Exam: Vitals:   01/19/16 1503  BP: (!) 104/59  Pulse: 95  Resp: 18  Temp: 99.1 F (37.3 C)  TempSrc: Oral  SpO2: 98%  Weight: 149 lb 12.8 oz (67.9 kg)  Height: 5\' 5"  (1.651 m)   Body mass index is 24.93 kg/m.  General- elderly male, in no acute distress Head- normocephalic, atraumatic Nose- no nasal discharge Throat- moist mucus membrane, has dentures  Eyes- PERRLA, EOMI, no pallor, no icterus Neck- no cervical lymphadenopathy Cardiovascular- normal s1,s2, no murmur, trace right leg edema Respiratory- bilateral clear to auscultation, no wheeze, no rhonchi, no crackles, no use of accessory muscles Abdomen- bowel sounds present, soft, epigastric tenderness present, no guarding or rigidity Musculoskeletal- left AKA, able to move all 3 extremities Neurological- alert and oriented to person, place and time Skin- warm and dry, left arm  AV fistula with good thrill, has calciphylaxis Psychiatry- normal mood and affect    Labs reviewed: Basic Metabolic Panel:  Recent Labs  11/08/15 0518  11/09/15 0547 11/10/15 0656 11/10/15 0826  11/26/15 1236 11/29/15 0323 11/30/15 0458  12/16/15 1050 12/16/15 1326 01/06/16 0933  NA 137  < > 138 138 136  < > 135 139 135  < > 136 138 140  K 4.8  < > 3.6 3.8 3.7  < > 3.1* 3.1* 3.4*  < > >8.5* 4.3 2.7*  CL 99*  < > 97* 98* 98*  < > 99* 108 102  --   --  102  --   CO2 24  < > 22 26 26   < > 22 20* 21*  --   --  23  --   GLUCOSE 91  < > 60* 89 88  < > 197* 174* 111*  --  79 72 122*  BUN 36*  < > 19 28* 27*  < > 47* 62* 26*  --   --  18  --   CREATININE 6.94*  < > 4.57* 6.41* 6.38*  < > 7.09* 9.01* 5.54*  --   --  7.57*  --   CALCIUM 7.8*  < > 7.7* 7.6* 7.5*  < > 7.9* 7.8* 7.5*  --   --  9.1  --   MG 2.1  --  2.0 2.1  --   --   --   --   --   --   --   --   --   PHOS  --   < >  --   --  3.2  --  1.5*  --  1.7*  --   --   --   --   < > = values in this interval not displayed. Liver Function Tests:  Recent Labs  11/10/15 0656  11/22/15 1955 11/26/15 1236 11/29/15 0323 11/30/15 0458 12/06/15  AST 19  < > 19  --  41  --  27  ALT 11*  --  10*  --  16*  --  9*  ALKPHOS 135*  < > 144*  --  103  --  109  BILITOT 1.2  --  1.7*  --  0.8  --   --   PROT 6.7  --  7.2  --  5.9*  --   --   ALBUMIN 3.0*  < > 3.1* 2.5* 2.4* 2.5*  --   < > = values in this interval not displayed.  Recent Labs  11/06/15 2350 11/08/15 1137 11/22/15 1955  LIPASE 105* 65* 34   No results for input(s): AMMONIA in the last 8760 hours. CBC:  Recent Labs  11/09/15 0547 11/10/15 0656 11/22/15 1955  11/29/15 0323  11/30/15 0459 12/06/15 12/16/15 1050 12/16/15 1326 01/06/16 0933  WBC 4.1 2.8* 9.5  < > 7.0  --  7.2 5.2  --  4.8  --   NEUTROABS 2.7 1.7 7.5  --   --   --   --   --   --   --   --   HGB 9.9* 9.5* 10.9*  < > 8.0*  8.1*  < > 8.7* 8.4* 12.9* 10.7* 9.2*  HCT 32.3* 30.8* 34.9*  < > 25.6*   --  26.9* 27* 38.0* 32.1* 27.0*  MCV 85.7 84.2 83.5  < > 82.1  --  83.3  --   --  80.9  --   PLT 86* 91* 119*  < > 163  --  110* 189  --  109*  --   < > = values in this interval not displayed. Cardiac Enzymes: No results for input(s): CKTOTAL, CKMB, CKMBINDEX, TROPONINI in the last 8760 hours. BNP: Invalid input(s): POCBNP CBG:  Recent Labs  11/29/15 2028 11/30/15 0742 11/30/15 1154  GLUCAP 143* 109* 185*      Assessment/Plan  Cancer of head of pancreas Pending further discussion on treatment plan per patient. Has oncology follow up on 02/10/16. Reviewed oncology note where pt and family decided on comfort care. Get hospice care consult to review further goals of care. patient has poor insight into his medical issues. Explained about him not being a surgical candidate. He would like to revisit the option for chemotherapy  Goals of care counselling Patient is alert and oriented this visit. Explained about his prognosis given his cancer and medical co-morbidities. He wants to be full code and would like to think about his treatment option and review them with his oncologist. he agrees to talk to hospice care team  ESRD On HD, refused dialysis today, have explained about problem that can arise with him missing dialysis. He remains adamant about not being in risk of developing problems and feels he can miss dialysis if he wants to. Monitor clinically for signs of fluid overload for now  Nausea and vomiting Could be multifactorial with newly diagnosed pancreatic cancer, starting of ms contin and possible acute pnacreatitis. NPO for now and to advance diet as tolerated. D/c ms contin and continue norco 10-325 mg q4h prn pain. If continues to have nausea and vomiting, will need re-evaluation  Epigastric pain Likely from his cancer of pancreas and possible acute pancreatitis. NPO for now to give bowel rest and advance diet as tolerated    [name], MD Internal Medicine [place], [place] [address] Cell Phone (Monday-Friday 8 am - 5 pm): [phone] On Call: [phone] and follow prompts after 5 pm and on weekends Office Phone: [phone] Office Fax: [phone]

## 2016-01-20 ENCOUNTER — Non-Acute Institutional Stay (SKILLED_NURSING_FACILITY): Payer: Medicare Other | Admitting: Family

## 2016-01-20 DIAGNOSIS — R1110 Vomiting, unspecified: Secondary | ICD-10-CM | POA: Diagnosis not present

## 2016-01-20 MED ORDER — ONDANSETRON HCL 4 MG PO TABS: 4.0000 mg | ORAL_TABLET | Freq: Three times a day (TID) | ORAL | 0 refills | Status: DC

## 2016-01-20 NOTE — Progress Notes (Signed)
.  Location:   [place] Room Number: [name] of Service:  SNF (31) Provider:Elsie Sakuma FNP-C   [name], MD  Patient Care Team: [name], MD as PCP - General (Internal Medicine) Donato Heinz, MD as Consulting Physician (Nephrology) Ladell Pier, MD as Consulting Physician (Oncology)  Extended Emergency Contact Information Primary Emergency Contact: [facility] Address: [address]          [place], [place] [address] [name] Phone: [phone] Relation: Daughter Secondary Emergency Contact: [facility] Address: [address]          [place], [place] [address] [name] Mobile Phone: [phone] Relation: Son  Code Status: Full Code  Goals of care: Advanced Directive information Advanced Directives 01/14/2016  Does patient have an advance directive? Yes  Type of Advance Directive [place]  Does patient want to make changes to advanced directive? -  Copy of advanced directive(s) in chart? No - copy requested  Would patient like information on creating an advanced directive? -  Pre-existing out of facility DNR order (yellow form or pink MOST form) -     Chief Complaint  Patient presents with  . Acute Visit    HPI:  Pt is a 80 y.o. male seen today at [facility] for an acute visit for evaluation of Nausea and vomiting. He has a significant medical history of recent cholelithiasis, Pancreatic Cancer, Type 2 DM, ESRD on hemodialysis among other conditions. He is seen in his room today per facility Nurse request. He reports Nausea and vomiting unable to tolerate oral medication. Patient supposed to be NPO since 01/19/2016 for 24 HRS but states had Oatmeal this morning.He denies any fever, chills or cough. He reports LBM 01/19/2016.    Past Medical History:  Diagnosis Date  . Arthritis   . CAD (coronary artery disease)    a. s/p  NSTEMI and failed RCA PCI in 2011. b. Nuc 12/2013: nonischemic, prior inferior MI.  . Calciphylaxis    a. Multiple admissions for nonhealing L ulcer/sepsis, s/p L AKA 02/02/2014.  Marland Kitchen CHF (congestive heart failure) ([place])   . Cholelithiasis 01/2014   acute cholecystitis managed with perc drain due to high surgical risk.   . Chronic anemia   . Dementia   . Depression with anxiety   . Dialysis patient [facility])    M, W, F; Humphreys ESRD (end stage renal disease) on dialysis ([place])   . GERD (gastroesophageal reflux disease)   . Heart attack ([place]) 2011; 1990's  . History of left bundle branch block   . Hx of above knee amputation ([place])    left  . Hyperlipidemia   . Hyperparathyroidism   . Hypertension   . Hypotension    renovascular  . LBBB (left bundle branch block)   . Migraine    none recent  . Pancreatic mass   . Peripheral neuropathy ([place])   . Renal insufficiency   . Stroke [facility])    several, last cva 2015 or 2014  . Type II diabetes mellitus ([place])    not taking any medications.   Past Surgical History:  Procedure Laterality Date  . AMPUTATION Left 02/02/2014   Procedure: AMPUTATION ABOVE KNEE-LEFT;  Surgeon: Elam Dutch, MD;  Location: [place];  Service: Vascular;  Laterality: Left;  . APPLICATION OF WOUND VAC Left 03/31/2014   Procedure: APPLICATION OF WOUND VAC - LEFT  AKA;  Surgeon: Elam Dutch, MD;  Location: [place];  Service: Vascular;  Laterality: Left;  . CAPD INSERTION  09/12/2011   Procedure: LAPAROSCOPIC INSERTION CONTINUOUS AMBULATORY PERITONEAL DIALYSIS  (CAPD) CATHETER;  Surgeon: Edward Jolly, MD;  Location: [place];  Service: General;  Laterality: N/A;  . CAPD REMOVAL  03/12/2012   Procedure: CONTINUOUS AMBULATORY PERITONEAL DIALYSIS  (CAPD) CATHETER REMOVAL;  Surgeon: Edward Jolly, MD;  Location: [place];  Service: General;  Laterality: N/A;  . COLON SURGERY  2011   colon resection   . COLONOSCOPY WITH PROPOFOL N/A 07/14/2014   Procedure:  COLONOSCOPY WITH PROPOFOL;  Surgeon: Jerene Bears, MD;  Location: [facility];  Service: Gastroenterology;  Laterality: N/A;  . DG AV DIALYSIS SHUNT INTRO NDL*R* OR     "not working" (09/19/11)  . ESOPHAGOGASTRODUODENOSCOPY Left 11/28/2015   Procedure: ESOPHAGOGASTRODUODENOSCOPY (EGD);  Surgeon: Juanita Craver, MD;  Location: [facility] END[facility]COPY;  Service: Endoscopy;  Laterality: Left;  . EUS N/A 01/06/2016   Procedure: UPPER ENDOSCOPIC ULTRASOUND (EUS) LINEAR;  Surgeon: Milus Banister, MD;  Location: [facility];  Service: Endoscopy;  Laterality: N/A;  . I&D EXTREMITY Left 03/31/2014   Procedure: IRRIGATION AND DEBRIDEMENT EXTREMITY - LEFT ABOVE KNEE AMPUTATION;  Surgeon: Elam Dutch, MD;  Location: [place];  Service: Vascular;  Laterality: Left;  . PARATHYROIDECTOMY  2011  . SP AV DIALYSIS SHUNT ACCESS EXISTING *L*  2003    Allergies  Allergen Reactions  . Benazepril Hcl Hives, Itching and Rash    flareups on patient head      Medication List       Accurate as of 01/20/16 12:57 PM. Always use your most recent med list.          acetaminophen 325 MG tablet Commonly known as:  TYLENOL Take 650 mg by mouth every 6 (six) hours as needed for mild pain, fever or headache (DO NOT EXCEED 4 GM OF APAP IN 24 HOURS FORM ALL SOURCES).   atorvastatin 20 MG tablet Commonly known as:  LIPITOR Take 10 mg by mouth at bedtime.   b-complex w/C Tabs tablet Take 1 tablet by mouth at bedtime.   docusate sodium 100 MG capsule Commonly known as:  COLACE Take 100 mg by mouth daily.   feeding supplement (PRO-STAT SUGAR FREE 64) Liqd Take 30 mLs by mouth 2 (two) times daily.   gabapentin 100 MG capsule Commonly known as:  NEURONTIN Take 100 mg by mouth at bedtime.   HYDROcodone-acetaminophen 10-325 MG tablet Commonly known as:  NORCO Take 1 tablet by mouth every 4 (four) hours as needed for moderate pain.   midodrine 5 MG tablet Commonly known as:  PROAMATINE Take 5 mg by mouth 3 (three) times  daily. Do not take 6 am dose on M- W- F- d/t  dialysis days.   ondansetron 4 MG tablet Commonly known as:  ZOFRAN Take 1 tablet (4 mg total) by mouth every 8 (eight) hours.   sevelamer carbonate 800 MG tablet Commonly known as:  RENVELA Take 800 mg by mouth daily with lunch. 12 pm and 5 pm.   sorbitol 70 % solution Take 30 mLs by mouth daily as needed (severe constipation).       Review of Systems  Constitutional: Negative for activity change, appetite change, chills and fever.  HENT: Negative for congestion, rhinorrhea, sinus pressure, sneezing and sore throat.   Eyes: Negative.   Respiratory: Negative for cough, chest tightness, shortness of breath and wheezing.  Cardiovascular: Negative for chest pain and palpitations.  Gastrointestinal: Positive for nausea and vomiting. Negative for abdominal distention, constipation and diarrhea.  Genitourinary:       Hemodialysis On Mon, Wed and Friday   Musculoskeletal: Positive for gait problem.       Left BKA   Skin: Negative for color change, pallor and rash.  Neurological: Negative for dizziness, seizures, syncope and headaches.  Hematological: Does not bruise/bleed easily.  Psychiatric/Behavioral: Negative for agitation, confusion, hallucinations and sleep disturbance. The patient is not nervous/anxious.     Immunization History  Administered Date(s) Administered  . Influenza-Unspecified 04/03/2014, 04/19/2015  . PPD Test 04/03/2014, 04/17/2014, 02/16/2015  . Pneumococcal Polysaccharide-23 02/08/2014  . Pneumococcal-Unspecified 04/03/2014   Pertinent  Health Maintenance Due  Topic Date Due  . OPHTHALMOLOGY EXAM  06/12/2016 (Originally 09/06/1945)  . URINE MICROALBUMIN  06/12/2016 (Originally 09/06/1945)  . PNA vac Low Risk Adult (2 of 2 - PCV13) 08/09/2016 (Originally 04/04/2015)  . INFLUENZA VACCINE  06/12/2017 (Originally 01/11/2016)  . HEMOGLOBIN A1C  06/12/2017 (Originally 01/04/2016)  . FOOT EXAM  08/18/2016   No  flowsheet data found. Functional Status Survey:    Vitals:   01/20/16 1200  BP: 90/60  Pulse: 90  Resp: 20  Temp: 98.2 F (36.8 C)  SpO2: 98%  Weight: 149 lb 12.8 oz (67.9 kg)  Height: 5\' 5"  (1.651 m)   Body mass index is 24.93 kg/m. Physical Exam  Constitutional: He is oriented to person, place, and time. He appears well-developed and well-nourished. No distress.  HENT:  Head: Normocephalic.  Mouth/Throat: Oropharynx is clear and moist. No oropharyngeal exudate.  Eyes: Conjunctivae and EOM are normal. Pupils are equal, round, and reactive to light. Right eye exhibits no discharge. Left eye exhibits no discharge. No scleral icterus.  Neck: Normal range of motion. No JVD present. No thyromegaly present.  Cardiovascular: Normal rate, regular rhythm, normal heart sounds and intact distal pulses.  Exam reveals no gallop and no friction rub.   No murmur heard. Pulmonary/Chest: Effort normal and breath sounds normal. No respiratory distress. He has no wheezes. He has no rales.  Abdominal: Soft. Bowel sounds are normal. He exhibits no distension. There is no rebound and no guarding.  Musculoskeletal: He exhibits no edema or tenderness.  Left BKA   Lymphadenopathy:    He has no cervical adenopathy.  Neurological: He is oriented to person, place, and time.  Skin: Skin is warm and dry. No rash noted. No erythema. No pallor.  Psychiatric: He has a normal mood and affect.    Labs reviewed:  Recent Labs  11/08/15 0518  11/09/15 0547 11/10/15 0656 11/10/15 0826  11/26/15 1236 11/29/15 0323 11/30/15 0458  12/16/15 1050 12/16/15 1326 01/06/16 0933  NA 137  < > 138 138 136  < > 135 139 135  < > 136 138 140  K 4.8  < > 3.6 3.8 3.7  < > 3.1* 3.1* 3.4*  < > >8.5* 4.3 2.7*  CL 99*  < > 97* 98* 98*  < > 99* 108 102  --   --  102  --   CO2 24  < > 22 26 26   < > 22 20* 21*  --   --  23  --   GLUCOSE 91  < > 60* 89 88  < > 197* 174* 111*  --  79 72 122*  BUN 36*  < > 19 28* 27*  < >  47* 62* 26*  --   --  18  --   CREATININE 6.94*  < > 4.57* 6.41* 6.38*  < > 7.09* 9.01* 5.54*  --   --  7.57*  --   CALCIUM 7.8*  < > 7.7* 7.6* 7.5*  < > 7.9* 7.8* 7.5*  --   --  9.1  --   MG 2.1  --  2.0 2.1  --   --   --   --   --   --   --   --   --   PHOS  --   < >  --   --  3.2  --  1.5*  --  1.7*  --   --   --   --   < > = values in this interval not displayed.  Recent Labs  11/10/15 0656  11/22/15 1955 11/26/15 1236 11/29/15 0323 11/30/15 0458 12/06/15  AST 19  < > 19  --  41  --  27  ALT 11*  --  10*  --  16*  --  9*  ALKPHOS 135*  < > 144*  --  103  --  109  BILITOT 1.2  --  1.7*  --  0.8  --   --   PROT 6.7  --  7.2  --  5.9*  --   --   ALBUMIN 3.0*  < > 3.1* 2.5* 2.4* 2.5*  --   < > = values in this interval not displayed.  Recent Labs  11/09/15 0547 11/10/15 0656 11/22/15 1955  11/29/15 0323  11/30/15 0459 12/06/15 12/16/15 1050 12/16/15 1326 01/06/16 0933  WBC 4.1 2.8* 9.5  < > 7.0  --  7.2 5.2  --  4.8  --   NEUTROABS 2.7 1.7 7.5  --   --   --   --   --   --   --   --   HGB 9.9* 9.5* 10.9*  < > 8.0*  8.1*  < > 8.7* 8.4* 12.9* 10.7* 9.2*  HCT 32.3* 30.8* 34.9*  < > 25.6*  --  26.9* 27* 38.0* 32.1* 27.0*  MCV 85.7 84.2 83.5  < > 82.1  --  83.3  --   --  80.9  --   PLT 86* 91* 119*  < > 163  --  110* 189  --  109*  --   < > = values in this interval not displayed. Lab Results  Component Value Date   TSH 1.050 12/21/2013   Lab Results  Component Value Date   HGBA1C 6.6 07/07/2015   Lab Results  Component Value Date   CHOL 88 12/06/2015   HDL 34 (A) 12/06/2015   LDLCALC 45 12/06/2015   TRIG 48 12/06/2015   CHOLHDL 4.7 09/20/2011    Assessment/Plan  Intractable vomiting with nausea, vomiting of unspecified type Continue NPO diet and advance to clear liquids as tolerated. Continue Phenergan per standing orders.change Zofran 4 mg Tablet to one by mouth every 8 hours.     Family/ staff Communication: Reviewed plan of care with patient and facility  Nurse.   Labs/tests ordered:  None

## 2016-01-21 ENCOUNTER — Encounter (HOSPITAL_COMMUNITY): Payer: Self-pay | Admitting: Emergency Medicine

## 2016-01-21 ENCOUNTER — Inpatient Hospital Stay (HOSPITAL_COMMUNITY)
Admission: EM | Admit: 2016-01-21 | Discharge: 2016-01-26 | DRG: 371 | Disposition: A | Discharge status: Skilled Nursing Facility | Payer: Medicare Other | Attending: Internal Medicine | Admitting: Internal Medicine

## 2016-01-21 ENCOUNTER — Emergency Department (HOSPITAL_COMMUNITY): Payer: Medicare Other

## 2016-01-21 ENCOUNTER — Other Ambulatory Visit: Payer: Self-pay

## 2016-01-21 ENCOUNTER — Non-Acute Institutional Stay (SKILLED_NURSING_FACILITY): Payer: Medicare Other | Admitting: Family

## 2016-01-21 DIAGNOSIS — Z888 Allergy status to other drugs, medicaments and biological substances status: Secondary | ICD-10-CM

## 2016-01-21 DIAGNOSIS — N2581 Secondary hyperparathyroidism of renal origin: Secondary | ICD-10-CM | POA: Diagnosis present

## 2016-01-21 DIAGNOSIS — Z7189 Other specified counseling: Secondary | ICD-10-CM

## 2016-01-21 DIAGNOSIS — N186 End stage renal disease: Secondary | ICD-10-CM

## 2016-01-21 DIAGNOSIS — Z89619 Acquired absence of unspecified leg above knee: Secondary | ICD-10-CM

## 2016-01-21 DIAGNOSIS — I132 Hypertensive heart and chronic kidney disease with heart failure and with stage 5 chronic kidney disease, or end stage renal disease: Secondary | ICD-10-CM | POA: Diagnosis present

## 2016-01-21 DIAGNOSIS — I252 Old myocardial infarction: Secondary | ICD-10-CM

## 2016-01-21 DIAGNOSIS — C259 Malignant neoplasm of pancreas, unspecified: Secondary | ICD-10-CM

## 2016-01-21 DIAGNOSIS — Z89612 Acquired absence of left leg above knee: Secondary | ICD-10-CM

## 2016-01-21 DIAGNOSIS — D638 Anemia in other chronic diseases classified elsewhere: Secondary | ICD-10-CM | POA: Diagnosis present

## 2016-01-21 DIAGNOSIS — R531 Weakness: Secondary | ICD-10-CM

## 2016-01-21 DIAGNOSIS — Z66 Do not resuscitate: Secondary | ICD-10-CM | POA: Diagnosis present

## 2016-01-21 DIAGNOSIS — I2510 Atherosclerotic heart disease of native coronary artery without angina pectoris: Secondary | ICD-10-CM | POA: Diagnosis present

## 2016-01-21 DIAGNOSIS — E43 Unspecified severe protein-calorie malnutrition: Secondary | ICD-10-CM | POA: Insufficient documentation

## 2016-01-21 DIAGNOSIS — Z992 Dependence on renal dialysis: Secondary | ICD-10-CM

## 2016-01-21 DIAGNOSIS — I959 Hypotension, unspecified: Secondary | ICD-10-CM | POA: Diagnosis not present

## 2016-01-21 DIAGNOSIS — R51 Headache: Secondary | ICD-10-CM | POA: Diagnosis not present

## 2016-01-21 DIAGNOSIS — R630 Anorexia: Secondary | ICD-10-CM

## 2016-01-21 DIAGNOSIS — R778 Other specified abnormalities of plasma proteins: Secondary | ICD-10-CM | POA: Diagnosis present

## 2016-01-21 DIAGNOSIS — G43A0 Cyclical vomiting, not intractable: Secondary | ICD-10-CM | POA: Diagnosis not present

## 2016-01-21 DIAGNOSIS — K219 Gastro-esophageal reflux disease without esophagitis: Secondary | ICD-10-CM | POA: Diagnosis present

## 2016-01-21 DIAGNOSIS — R112 Nausea with vomiting, unspecified: Secondary | ICD-10-CM | POA: Diagnosis not present

## 2016-01-21 DIAGNOSIS — Z8673 Personal history of transient ischemic attack (TIA), and cerebral infarction without residual deficits: Secondary | ICD-10-CM

## 2016-01-21 DIAGNOSIS — A419 Sepsis, unspecified organism: Secondary | ICD-10-CM

## 2016-01-21 DIAGNOSIS — R0789 Other chest pain: Secondary | ICD-10-CM

## 2016-01-21 DIAGNOSIS — E785 Hyperlipidemia, unspecified: Secondary | ICD-10-CM | POA: Diagnosis present

## 2016-01-21 DIAGNOSIS — Z6821 Body mass index (BMI) 21.0-21.9, adult: Secondary | ICD-10-CM

## 2016-01-21 DIAGNOSIS — F0390 Unspecified dementia without behavioral disturbance: Secondary | ICD-10-CM | POA: Diagnosis present

## 2016-01-21 DIAGNOSIS — C250 Malignant neoplasm of head of pancreas: Secondary | ICD-10-CM | POA: Diagnosis present

## 2016-01-21 DIAGNOSIS — Z515 Encounter for palliative care: Secondary | ICD-10-CM

## 2016-01-21 DIAGNOSIS — I509 Heart failure, unspecified: Secondary | ICD-10-CM | POA: Diagnosis present

## 2016-01-21 DIAGNOSIS — E1122 Type 2 diabetes mellitus with diabetic chronic kidney disease: Secondary | ICD-10-CM | POA: Diagnosis present

## 2016-01-21 DIAGNOSIS — A047 Enterocolitis due to Clostridium difficile: Secondary | ICD-10-CM | POA: Diagnosis not present

## 2016-01-21 DIAGNOSIS — E876 Hypokalemia: Secondary | ICD-10-CM | POA: Diagnosis present

## 2016-01-21 DIAGNOSIS — Z79899 Other long term (current) drug therapy: Secondary | ICD-10-CM

## 2016-01-21 DIAGNOSIS — Z87891 Personal history of nicotine dependence: Secondary | ICD-10-CM

## 2016-01-21 DIAGNOSIS — R519 Headache, unspecified: Secondary | ICD-10-CM

## 2016-01-21 DIAGNOSIS — I9589 Other hypotension: Secondary | ICD-10-CM | POA: Diagnosis present

## 2016-01-21 DIAGNOSIS — E860 Dehydration: Secondary | ICD-10-CM | POA: Diagnosis present

## 2016-01-21 DIAGNOSIS — K8689 Other specified diseases of pancreas: Secondary | ICD-10-CM | POA: Diagnosis present

## 2016-01-21 LAB — I-STAT CG4 LACTIC ACID, ED
LACTIC ACID, VENOUS: 2.23 mmol/L — AB (ref 0.5–1.9)
LACTIC ACID, VENOUS: 2.07 mmol/L — AB (ref 0.5–1.9)

## 2016-01-21 LAB — I-STAT CHEM 8, ED
BUN: 38 mg/dL — AB (ref 6–20)
CHLORIDE: 87 mmol/L — AB (ref 101–111)
Calcium, Ion: 0.82 mmol/L — ABNORMAL LOW (ref 1.12–1.23)
Creatinine, Ser: 7.7 mg/dL — ABNORMAL HIGH (ref 0.61–1.24)
Glucose, Bld: 105 mg/dL — ABNORMAL HIGH (ref 65–99)
HEMATOCRIT: 38 % — AB (ref 39.0–52.0)
Hemoglobin: 12.9 g/dL — ABNORMAL LOW (ref 13.0–17.0)
Potassium: 3.1 mmol/L — ABNORMAL LOW (ref 3.5–5.1)
SODIUM: 137 mmol/L (ref 135–145)
TCO2: 34 mmol/L (ref 0–100)

## 2016-01-21 LAB — CBG MONITORING, ED: GLUCOSE-CAPILLARY: 97 mg/dL (ref 65–99)

## 2016-01-21 LAB — CBC
HCT: 33.3 % — ABNORMAL LOW (ref 39.0–52.0)
Hemoglobin: 10.6 g/dL — ABNORMAL LOW (ref 13.0–17.0)
MCH: 26.6 pg (ref 26.0–34.0)
MCHC: 31.8 g/dL (ref 30.0–36.0)
MCV: 83.7 fL (ref 78.0–100.0)
PLATELETS: 197 10*3/uL (ref 150–400)
RBC: 3.98 MIL/uL — AB (ref 4.22–5.81)
RDW: 19.4 % — ABNORMAL HIGH (ref 11.5–15.5)
WBC: 11.8 10*3/uL — ABNORMAL HIGH (ref 4.0–10.5)

## 2016-01-21 LAB — COMPREHENSIVE METABOLIC PANEL
ALT: 12 U/L — ABNORMAL LOW (ref 17–63)
AST: 30 U/L (ref 15–41)
Albumin: 2.7 g/dL — ABNORMAL LOW (ref 3.5–5.0)
Alkaline Phosphatase: 238 U/L — ABNORMAL HIGH (ref 38–126)
Anion gap: 22 — ABNORMAL HIGH (ref 5–15)
BUN: 39 mg/dL — AB (ref 6–20)
CHLORIDE: 85 mmol/L — AB (ref 101–111)
CO2: 32 mmol/L (ref 22–32)
Calcium: 7.6 mg/dL — ABNORMAL LOW (ref 8.9–10.3)
Creatinine, Ser: 8.69 mg/dL — ABNORMAL HIGH (ref 0.61–1.24)
GFR, EST AFRICAN AMERICAN: 6 mL/min — AB (ref >60)
GFR, EST NON AFRICAN AMERICAN: 5 mL/min — AB (ref >60)
Glucose, Bld: 105 mg/dL — ABNORMAL HIGH (ref 65–99)
POTASSIUM: 3.2 mmol/L — AB (ref 3.5–5.1)
SODIUM: 139 mmol/L (ref 135–145)
Total Bilirubin: 1.9 mg/dL — ABNORMAL HIGH (ref 0.3–1.2)
Total Protein: 8 g/dL (ref 6.5–8.1)

## 2016-01-21 LAB — TROPONIN I: Troponin I: 0.08 ng/mL (ref <0.03)

## 2016-01-21 LAB — GLUCOSE, CAPILLARY: GLUCOSE-CAPILLARY: 114 mg/dL — AB (ref 65–99)

## 2016-01-21 LAB — I-STAT TROPONIN, ED: Troponin i, poc: 0.09 ng/mL (ref 0.00–0.08)

## 2016-01-21 LAB — LIPASE, BLOOD: Lipase: 48 U/L (ref 11–51)

## 2016-01-21 MED ORDER — HEPARIN SODIUM (PORCINE) 5000 UNIT/ML IJ SOLN
5000.0000 [IU] | Freq: Three times a day (TID) | INTRAMUSCULAR | Status: DC
  Administered 2016-01-22 – 2016-01-26 (×9): 5000 [IU] via SUBCUTANEOUS
  Filled 2016-01-21 (×9): qty 1

## 2016-01-21 MED ORDER — IOPAMIDOL (ISOVUE-300) INJECTION 61%
INTRAVENOUS | Status: AC
  Administered 2016-01-21: 80 mL
  Filled 2016-01-21: qty 100

## 2016-01-21 MED ORDER — SODIUM CHLORIDE 0.9 % IV BOLUS (SEPSIS)
250.0000 mL | Freq: Once | INTRAVENOUS | Status: AC
  Administered 2016-01-21: 250 mL via INTRAVENOUS

## 2016-01-21 MED ORDER — POTASSIUM CHLORIDE CRYS ER 20 MEQ PO TBCR
40.0000 meq | EXTENDED_RELEASE_TABLET | Freq: Once | ORAL | Status: AC
  Administered 2016-01-21: 40 meq via ORAL
  Filled 2016-01-21: qty 2

## 2016-01-21 MED ORDER — MIDODRINE HCL 5 MG PO TABS
5.0000 mg | ORAL_TABLET | ORAL | Status: DC
  Administered 2016-01-22 – 2016-01-23 (×6): 5 mg via ORAL
  Filled 2016-01-21 (×8): qty 1

## 2016-01-21 MED ORDER — SEVELAMER CARBONATE 800 MG PO TABS: 800.0000 mg | ORAL_TABLET | ORAL | Status: DC

## 2016-01-21 MED ORDER — ATORVASTATIN CALCIUM 10 MG PO TABS
10.0000 mg | ORAL_TABLET | Freq: Every day | ORAL | Status: DC
  Administered 2016-01-21 – 2016-01-23 (×3): 10 mg via ORAL
  Filled 2016-01-21 (×5): qty 1

## 2016-01-21 MED ORDER — SODIUM CHLORIDE 0.9 % IV SOLN: INTRAVENOUS | Status: DC

## 2016-01-21 MED ORDER — ACETAMINOPHEN 325 MG PO TABS
650.0000 mg | ORAL_TABLET | Freq: Four times a day (QID) | ORAL | Status: DC | PRN
  Administered 2016-01-21 – 2016-01-23 (×4): 650 mg via ORAL
  Filled 2016-01-21 (×4): qty 2

## 2016-01-21 MED ORDER — ONDANSETRON HCL 4 MG PO TABS: 4.0000 mg | ORAL_TABLET | Freq: Four times a day (QID) | ORAL | Status: DC | PRN

## 2016-01-21 MED ORDER — ENOXAPARIN SODIUM 40 MG/0.4ML ~~LOC~~ SOLN: 40.0000 mg | SUBCUTANEOUS | Status: DC

## 2016-01-21 MED ORDER — SEVELAMER CARBONATE 800 MG PO TABS
800.0000 mg | ORAL_TABLET | Freq: Two times a day (BID) | ORAL | Status: DC
  Administered 2016-01-26: 800 mg via ORAL
  Filled 2016-01-21 (×5): qty 1

## 2016-01-21 MED ORDER — FENTANYL CITRATE (PF) 100 MCG/2ML IJ SOLN
25.0000 ug | INTRAMUSCULAR | Status: DC | PRN
  Administered 2016-01-22: 25 ug via INTRAVENOUS

## 2016-01-21 MED ORDER — MIDODRINE HCL 5 MG PO TABS
5.0000 mg | ORAL_TABLET | ORAL | Status: DC
  Administered 2016-01-24 – 2016-01-26 (×3): 5 mg via ORAL
  Filled 2016-01-21 (×2): qty 1

## 2016-01-21 MED ORDER — HYDROMORPHONE HCL 1 MG/ML IJ SOLN: 0.5000 mg | INTRAMUSCULAR | Status: DC | PRN

## 2016-01-21 MED ORDER — SODIUM CHLORIDE 0.9 % IV SOLN
1500.0000 mg | Freq: Once | INTRAVENOUS | Status: AC
  Administered 2016-01-21: 1500 mg via INTRAVENOUS
  Filled 2016-01-21: qty 1500

## 2016-01-21 MED ORDER — GABAPENTIN 100 MG PO CAPS
100.0000 mg | ORAL_CAPSULE | Freq: Every day | ORAL | Status: DC
  Administered 2016-01-21 – 2016-01-23 (×3): 100 mg via ORAL
  Filled 2016-01-21 (×5): qty 1

## 2016-01-21 MED ORDER — ONDANSETRON HCL 4 MG/2ML IJ SOLN
4.0000 mg | Freq: Four times a day (QID) | INTRAMUSCULAR | Status: DC | PRN
  Administered 2016-01-24: 4 mg via INTRAVENOUS
  Filled 2016-01-21: qty 2

## 2016-01-21 MED ORDER — PIPERACILLIN-TAZOBACTAM 3.375 G IVPB
3.3750 g | Freq: Two times a day (BID) | INTRAVENOUS | Status: DC
  Administered 2016-01-21 – 2016-01-22 (×3): 3.375 g via INTRAVENOUS
  Filled 2016-01-21 (×4): qty 50

## 2016-01-21 MED ORDER — MIDODRINE HCL 5 MG PO TABS
5.0000 mg | ORAL_TABLET | ORAL | Status: DC
Start: 2016-01-21 — End: 2016-01-21

## 2016-01-21 NOTE — ED Notes (Signed)
Pt's CBG result was 97. Informed Clark - RN.

## 2016-01-21 NOTE — ED Triage Notes (Signed)
Pt from [facility] via [place] with c/o generalized weakness since this past Monday and a decreased appetite due to emesis after eating since this past Fri. Recent dx of pancreatic cancer.  Pt has also missed dialysis Wed and Fri due to being too weak to go.  Last full dialysis Monday.  Pt in NAD, A&O.

## 2016-01-21 NOTE — ED Notes (Addendum)
Unsuccessful IV attempt x1.  MD to start ultrasound assisted IV.

## 2016-01-21 NOTE — Progress Notes (Signed)
CRITICAL VALUE ALERT  Critical value received:  Troponin  Level of 0.08  Date of notification:  01/21/2016  Time of notification:  21:43  Critical value read back:Yes.    Nurse who received alert:  Leandro Reasoner  MD notified (1st page):  Nelle Don, NP  Time of first page:  21:43  MD notified (2nd page):  Time of second page:  Responding MD:  Nelle Don  Time MD responded:  10:14

## 2016-01-21 NOTE — Progress Notes (Signed)
Pharmacy Antibiotic Note  Sean [name] is a 80 y.o. male admitted from [facility] on 01/21/2016 with sepsis.  Pharmacy has been consulted for vanc/zosyn dosing. ESRD on HD MWF (refused today and Wed this week). Recent diagnosis of pancreatic cancer. Afeb, wbc 11.8.  Plan: Zosyn 3.375g IV q12h Vanc 1500mg  IV x 1 dose; then g IV qHD (not yet entered) Monitor clinical progress, c/s, renal function, abx plan/LOT Pre-HD VR as indicated F/u HD schedule inpatient for vanc maintenance dosing     Temp (24hrs), Avg:98 F (36.7 C), Min:98 F (36.7 C), Max:98 F (36.7 C)   Recent Labs Lab 01/21/16 1342 01/21/16 1356  WBC 11.8*  --   CREATININE 8.69* 7.70*  LATICACIDVEN  --  2.23*    Estimated Creatinine Clearance: 6.9 mL/min (by C-G formula based on SCr of 7.7 mg/dL).    Allergies  Allergen Reactions  . Benazepril Hcl Hives, Itching and Rash    flareups on patient head    Antimicrobials this admission: 8/11 vanc >>  8/11 zosyn >>   Dose adjustments this admission:   Microbiology results:   Elicia Lamp, PharmD, BCPS Clinical Pharmacist 01/21/2016 3:02 PM

## 2016-01-21 NOTE — ED Notes (Signed)
Pt given ice chips, per Lennette Bihari, RN.

## 2016-01-21 NOTE — Progress Notes (Signed)
Location:    [place] Room Number: 402A  Place of Service:  SNF (31) Provider:  Dinah Ngetich FNP-C   [name], MD  Patient Care Team: [name], MD as PCP - General (Internal Medicine) Donato Heinz, MD as Consulting Physician (Nephrology) Ladell Pier, MD as Consulting Physician (Oncology)  Extended Emergency Contact Information Primary Emergency Contact: [facility] Address: [address]          [place], [place] [address] [name] Phone: [phone] Relation: Daughter Secondary Emergency Contact: [facility] Address: [address]          [place], [place] [address] [name] Mobile Phone: [phone] Relation: Son  Code Status: Full Code  Goals of care: Advanced Directive information Advanced Directives 01/14/2016  Does patient have an advance directive? Yes  Type of Advance Directive [place]  Does patient want to make changes to advanced directive? -  Copy of advanced directive(s) in chart? No - copy requested  Would patient like information on creating an advanced directive? -  Pre-existing out of facility DNR order (yellow form or pink MOST form) -     Chief Complaint  Patient presents with  . Acute Visit    HPI:  Pt is a 80 y.o. male seen today at [facility] for an acute visit for evaluation of generalized weakness, N/V decreased oral intake and Hypotension. He has a significant medical history of type 2 DM, ESRD,CHF,Cholelithiaisis and pancreatic cancer. He is seen in his room today per facility Nurse supervisor request. Facility Nurse states patient has refused dialysis twice this weeks, continues to have nausea and vomiting and not tolerating any oral intake. He was NPO x 24 hours then diet advance to clear liquids as tolerated. He has had zofran and Phenergan suppository for Nausea and vomiting. He complains of generalized  weakness, headache and chest tightness. He states "I don't know what to do". Of note patient was referred to Hospice service but patient's daughter postponed the appointment date. Patient would like to remain full code for now and would like to revisit the option for chemotherapy with his oncologist. He has oncology appointment 02/10/2016. He denies any fever, chills or diarrhea.    Past Medical History:  Diagnosis Date  . Arthritis   . CAD (coronary artery disease)    a. s/p NSTEMI and failed RCA PCI in 2011. b. Nuc 12/2013: nonischemic, prior inferior MI.  . Calciphylaxis    a. Multiple admissions for nonhealing L ulcer/sepsis, s/p L AKA 02/02/2014.  Marland Kitchen CHF (congestive heart failure) ([place])   . Cholelithiasis 01/2014   acute cholecystitis managed with perc drain due to high surgical risk.   . Chronic anemia   . Dementia   . Depression with anxiety   . Dialysis patient [facility])    M, W, F; [place] ESRD (end stage renal disease) on dialysis ([place])   . GERD (gastroesophageal reflux disease)   . Heart attack ([place]) 2011; 1990's  . History of left bundle branch block   . Hx of above knee amputation ([place])    left  . Hyperlipidemia   . Hyperparathyroidism   . Hypertension   . Hypotension    renovascular  . LBBB (left bundle branch block)   . Migraine    none recent  . Pancreatic mass   . Peripheral neuropathy ([place])   . Renal insufficiency   . Stroke [facility])  several, last cva 2015 or 2014  . Type II diabetes mellitus ([place])    not taking any medications.   Past Surgical History:  Procedure Laterality Date  . AMPUTATION Left 02/02/2014   Procedure: AMPUTATION ABOVE KNEE-LEFT;  Surgeon: Elam Dutch, MD;  Location: [place];  Service: Vascular;  Laterality: Left;  . APPLICATION OF WOUND VAC Left 03/31/2014   Procedure: APPLICATION OF WOUND VAC - LEFT AKA;  Surgeon: Elam Dutch, MD;  Location: [place];  Service: Vascular;  Laterality: Left;  . CAPD INSERTION  09/12/2011    Procedure: LAPAROSCOPIC INSERTION CONTINUOUS AMBULATORY PERITONEAL DIALYSIS  (CAPD) CATHETER;  Surgeon: Edward Jolly, MD;  Location: [place];  Service: General;  Laterality: N/A;  . CAPD REMOVAL  03/12/2012   Procedure: CONTINUOUS AMBULATORY PERITONEAL DIALYSIS  (CAPD) CATHETER REMOVAL;  Surgeon: Edward Jolly, MD;  Location: [place];  Service: General;  Laterality: N/A;  . COLON SURGERY  2011   colon resection   . COLONOSCOPY WITH PROPOFOL N/A 07/14/2014   Procedure: COLONOSCOPY WITH PROPOFOL;  Surgeon: Jerene Bears, MD;  Location: [facility];  Service: Gastroenterology;  Laterality: N/A;  . DG AV DIALYSIS SHUNT INTRO NDL*R* OR     "not working" (09/19/11)  . ESOPHAGOGASTRODUODENOSCOPY Left 11/28/2015   Procedure: ESOPHAGOGASTRODUODENOSCOPY (EGD);  Surgeon: Juanita Craver, MD;  Location: [facility] ENDOSCOPY;  Service: Endoscopy;  Laterality: Left;  . EUS N/A 01/06/2016   Procedure: UPPER ENDOSCOPIC ULTRASOUND (EUS) LINEAR;  Surgeon: Milus Banister, MD;  Location: [facility];  Service: Endoscopy;  Laterality: N/A;  . I&D EXTREMITY Left 03/31/2014   Procedure: IRRIGATION AND DEBRIDEMENT EXTREMITY - LEFT ABOVE KNEE AMPUTATION;  Surgeon: Elam Dutch, MD;  Location: [place];  Service: Vascular;  Laterality: Left;  . PARATHYROIDECTOMY  2011  . SP AV DIALYSIS SHUNT ACCESS EXISTING *L*  2003    Allergies  Allergen Reactions  . Benazepril Hcl Hives, Itching and Rash    flareups on patient head      Medication List       Accurate as of 01/21/16 12:37 PM. Always use your most recent med list.          acetaminophen 325 MG tablet Commonly known as:  TYLENOL Take 650 mg by mouth every 6 (six) hours as needed for mild pain, fever or headache (DO NOT EXCEED 4 GM OF APAP IN 24 HOURS FORM ALL SOURCES).   atorvastatin 20 MG tablet Commonly known as:  LIPITOR Take 10 mg by mouth at bedtime.   b-complex w/C Tabs tablet Take 1 tablet by mouth at bedtime.   docusate sodium 100 MG  capsule Commonly known as:  COLACE Take 100 mg by mouth daily.   feeding supplement (PRO-STAT SUGAR FREE 64) Liqd Take 30 mLs by mouth 2 (two) times daily.   gabapentin 100 MG capsule Commonly known as:  NEURONTIN Take 100 mg by mouth at bedtime.   HYDROcodone-acetaminophen 10-325 MG tablet Commonly known as:  NORCO Take 1 tablet by mouth every 4 (four) hours as needed for moderate pain.   midodrine 5 MG tablet Commonly known as:  PROAMATINE Take 5 mg by mouth 3 (three) times daily. Do not take 6 am dose on M- W- F- d/t  dialysis days.   ondansetron 4 MG tablet Commonly known as:  ZOFRAN Take 1 tablet (4 mg total) by mouth every 8 (eight) hours.   sevelamer carbonate 800 MG tablet Commonly known as:  RENVELA Take 800 mg by mouth daily  with lunch. 12 pm and 5 pm.   sorbitol 70 % solution Take 30 mLs by mouth daily as needed (severe constipation).       Review of Systems  Constitutional: Positive for appetite change and fatigue. Negative for activity change, chills and fever.  HENT: Negative for congestion, rhinorrhea, sinus pressure, sneezing and sore throat.   Eyes: Negative.   Respiratory: Positive for chest tightness. Negative for cough, shortness of breath and wheezing.   Cardiovascular: Negative for chest pain and palpitations.  Gastrointestinal: Positive for nausea and vomiting. Negative for abdominal distention, constipation and diarrhea.  Genitourinary:       Usual Hemodialysis days on Mon, Wed and Friday. He refused dialysis on Wednesday and Today.   Musculoskeletal: Positive for gait problem.       Left BKA   Skin: Negative for color change, pallor and rash.  Neurological: Positive for headaches. Negative for dizziness, seizures and syncope.  Hematological: Does not bruise/bleed easily.  Psychiatric/Behavioral: Negative for agitation, confusion, hallucinations and sleep disturbance. The patient is not nervous/anxious.     Immunization History  Administered  Date(s) Administered  . Influenza-Unspecified 04/03/2014, 04/19/2015  . PPD Test 04/03/2014, 04/17/2014, 02/16/2015  . Pneumococcal Polysaccharide-23 02/08/2014  . Pneumococcal-Unspecified 04/03/2014   Pertinent  Health Maintenance Due  Topic Date Due  . OPHTHALMOLOGY EXAM  06/12/2016 (Originally 09/06/1945)  . URINE MICROALBUMIN  06/12/2016 (Originally 09/06/1945)  . PNA vac Low Risk Adult (2 of 2 - PCV13) 08/09/2016 (Originally 04/04/2015)  . INFLUENZA VACCINE  06/12/2017 (Originally 01/11/2016)  . HEMOGLOBIN A1C  06/12/2017 (Originally 01/04/2016)  . FOOT EXAM  08/18/2016      Vitals:   01/21/16 1218  BP: 90/60  Pulse: 99  Resp: 20  Temp: 98 F (36.7 C)  SpO2: 98%  Weight: 149 lb 12.8 oz (67.9 kg)  Height: 5\' 5"  (1.651 m)   Body mass index is 24.93 kg/m. Physical Exam  Constitutional: He is oriented to person, place, and time. He appears well-developed and well-nourished. No distress.  HENT:  Head: Normocephalic.  Mouth/Throat: Oropharynx is clear and moist. No oropharyngeal exudate.  Eyes: Conjunctivae and EOM are normal. Pupils are equal, round, and reactive to light. Right eye exhibits no discharge. Left eye exhibits no discharge. No scleral icterus.  Neck: Normal range of motion. No JVD present. No thyromegaly present.  Cardiovascular: Normal rate, regular rhythm, normal heart sounds and intact distal pulses.  Exam reveals no gallop and no friction rub.   No murmur heard. Pulmonary/Chest: Effort normal and breath sounds normal. No respiratory distress. He has no wheezes. He has no rales.  Abdominal: Soft. Bowel sounds are normal. He exhibits no distension. There is no rebound and no guarding.  Musculoskeletal: He exhibits no edema or tenderness.  Left BKA. Generalized weakness   Lymphadenopathy:    He has no cervical adenopathy.  Neurological: He is oriented to person, place, and time.  Skin: Skin is warm and dry. No rash noted. No erythema. No pallor.  Left AV  fistula positive for thrill and bruit. Site without any signs of infections.   Psychiatric: He has a normal mood and affect.    Labs reviewed:  Recent Labs  11/08/15 0518  11/09/15 0547 11/10/15 0656 11/10/15 0826  11/26/15 1236 11/29/15 0323 11/30/15 0458  12/16/15 1050 12/16/15 1326 01/06/16 0933  NA 137  < > 138 138 136  < > 135 139 135  < > 136 138 140  K 4.8  < > 3.6 3.8 3.7  < >  3.1* 3.1* 3.4*  < > >8.5* 4.3 2.7*  CL 99*  < > 97* 98* 98*  < > 99* 108 102  --   --  102  --   CO2 24  < > 22 26 26   < > 22 20* 21*  --   --  23  --   GLUCOSE 91  < > 60* 89 88  < > 197* 174* 111*  --  79 72 122*  BUN 36*  < > 19 28* 27*  < > 47* 62* 26*  --   --  18  --   CREATININE 6.94*  < > 4.57* 6.41* 6.38*  < > 7.09* 9.01* 5.54*  --   --  7.57*  --   CALCIUM 7.8*  < > 7.7* 7.6* 7.5*  < > 7.9* 7.8* 7.5*  --   --  9.1  --   MG 2.1  --  2.0 2.1  --   --   --   --   --   --   --   --   --   PHOS  --   < >  --   --  3.2  --  1.5*  --  1.7*  --   --   --   --   < > = values in this interval not displayed.  Recent Labs  11/10/15 0656  11/22/15 1955 11/26/15 1236 11/29/15 0323 11/30/15 0458 12/06/15  AST 19  < > 19  --  41  --  27  ALT 11*  --  10*  --  16*  --  9*  ALKPHOS 135*  < > 144*  --  103  --  109  BILITOT 1.2  --  1.7*  --  0.8  --   --   PROT 6.7  --  7.2  --  5.9*  --   --   ALBUMIN 3.0*  < > 3.1* 2.5* 2.4* 2.5*  --   < > = values in this interval not displayed.  Recent Labs  11/09/15 0547 11/10/15 0656 11/22/15 1955  11/29/15 0323  11/30/15 0459 12/06/15 12/16/15 1050 12/16/15 1326 01/06/16 0933  WBC 4.1 2.8* 9.5  < > 7.0  --  7.2 5.2  --  4.8  --   NEUTROABS 2.7 1.7 7.5  --   --   --   --   --   --   --   --   HGB 9.9* 9.5* 10.9*  < > 8.0*  8.1*  < > 8.7* 8.4* 12.9* 10.7* 9.2*  HCT 32.3* 30.8* 34.9*  < > 25.6*  --  26.9* 27* 38.0* 32.1* 27.0*  MCV 85.7 84.2 83.5  < > 82.1  --  83.3  --   --  80.9  --   PLT 86* 91* 119*  < > 163  --  110* 189  --  109*  --   < >  = values in this interval not displayed. Lab Results  Component Value Date   TSH 1.050 12/21/2013   Lab Results  Component Value Date   HGBA1C 6.6 07/07/2015   Lab Results  Component Value Date   CHOL 88 12/06/2015   HDL 34 (A) 12/06/2015   LDLCALC 45 12/06/2015   TRIG 48 12/06/2015   CHOLHDL 4.7 09/20/2011   Assessment/Plan 1. Generalized weakness Worsening weakness possible due to decreased oral intake with N/V. He has refused dialysis twice this week on Wednesday  and Friday. Refer to ED for evaluation.   2. Intractable vomiting with nausea, unspecified vomiting type Might be multifactorial Gall bladder, recent diagnosis of advance Pancreatic Cancer or ESRD.   3. Poor appetite Has been NPO x 24 hours with diet advanced to clear liquids as tolerated but he has not tolerated any oral intake Excepts sips of ginger ale this morning.   4. Headache, unspecified headache type Generalized headache. Possible from vomiting or poor oral intake/dehydration.   5. Chest tightness unknown etiology. No cough.Exam findings negative. Refer to ED for evaluation given refusal for dialysis will need BMP checked to evaluate his potassium level.   6. Hypotension  B/P 90's/60's possible dehydration from vomiting and poor oral intake.   7. ESRD  Usually on dialysis on Mon, Wed and Friday but he refused dialysis on Wednesday and today(friday).   8. Pancreatic Cancer  Has appointment with oncologist 02/10/2016. Patient agreed to follow up with Hospice service but desired to remain full code for now. Patient's daughter postponed Hospice meeting.   Family/ staff Communication:Reviewed plan with patient and Facility Nurse supervisor  Labs/tests ordered: None Referred to ED for evaluation.

## 2016-01-21 NOTE — ED Provider Notes (Signed)
[facility] DEPT Provider Note   CSN: [id number] Arrival date & time: 01/21/16  1255  First Provider Contact:  None       History   Chief Complaint Chief Complaint  Patient presents with  . Weakness  . Anorexia  . Chest Pain  . Emesis    HPI Sean [name] is a 80 y.o. male.  The history is provided by the patient and medical records.   80 year old male with past medical history of end-stage renal disease, recently diagnosed pancreatic adenocarcinoma, coronary artery disease and multiple other comorbidities who presents with generalized weakness and inability to tolerate by mouth over the last week. The patient endorses gradually worsening nausea and vomiting. He states he's been unable to tolerate any by mouth intake. He has felt generally unwell and was subsequently unable to attend dialysis on Monday or Wednesday. He normally undergoes Monday, Wednesday, Friday hemodialysis. Currently endorses ongoing nausea and vomiting. Denies any associated abdominal pain. Denies any shortness of breath. He states he feels generally unwell but denies any chest pain. He denies any lower extremity edema above his baseline.  Past Medical History:  Diagnosis Date  . Arthritis   . CAD (coronary artery disease)    a. s/p NSTEMI and failed RCA PCI in 2011. b. Nuc 12/2013: nonischemic, prior inferior MI.  . Calciphylaxis    a. Multiple admissions for nonhealing L ulcer/sepsis, s/p L AKA 02/02/2014.  Marland Kitchen CHF (congestive heart failure) ([place])   . Cholelithiasis 01/2014   acute cholecystitis managed with perc drain due to high surgical risk.   . Chronic anemia   . Dementia   . Depression with anxiety   . Dialysis patient [facility])    M, W, F; [place] ESRD (end stage renal disease) on dialysis ([place])   . GERD (gastroesophageal reflux disease)   . Heart attack ([place]) 2011; 1990's  . History of left bundle branch block   . Hx of above knee amputation ([place])    left  . Hyperlipidemia   .  Hyperparathyroidism   . Hypertension   . Hypotension    renovascular  . LBBB (left bundle branch block)   . Migraine    none recent  . Pancreatic mass   . Peripheral neuropathy ([place])   . Renal insufficiency   . Stroke [facility])    several, last cva 2015 or 2014  . Type II diabetes mellitus ([place])    not taking any medications.    Patient Active Problem List   Diagnosis Date Noted  . Nausea & vomiting 01/21/2016  . Primary cancer of head of pancreas ([place]) 01/14/2016  . Palliative care encounter   . Encounter for palliative care   . Goals of care, counseling/discussion   . Duodenal ulcer disease   . Esophagitis   . Pancreatic mass 11/26/2015  . Sepsis ([place]) 11/23/2015  . Chronic systolic CHF (congestive heart failure) ([place])   . Gallstone pancreatitis 11/07/2015  . Abdominal pain, epigastric 11/07/2015  . PAD (peripheral artery disease) ([place]) 09/14/2015  . Phantom pain ([place]) 09/14/2015  . Hemodialysis-associated hypotension 09/14/2015  . Protein-calorie malnutrition ([place]) 09/14/2015  . Type 2 diabetes mellitus with chronic kidney disease on chronic dialysis, without long-term current use of insulin ([place]) 03/25/2015  . Chronic combined systolic and diastolic heart failure ([place]) 03/01/2015  . Neuropathic pain 01/12/2015  . Abnormal CT scan 05/29/2014  . Cholelithiasis   . Atherosclerotic peripheral vascular disease with ulceration ([place])   . S/P AKA (above knee  amputation) unilateral ([place]) 05/03/2014  . Dyslipidemia 04/07/2014  . Femoropopliteal arterial thrombosis of left lower extremity ([place]) 01/27/2014  . Depression 01/01/2014  . Secondary hyperparathyroidism ([place]) 12/20/2013  . CAD (coronary artery disease), native coronary artery 12/16/2013  . Diabetes mellitus due to underlying condition ([place]) 09/19/2011  . Anxiety state 01/15/2009  . Acute myocardial infarction ([place]) 01/15/2009  . End stage renal disease on dialysis [facility]) 01/15/2009    Past Surgical History:    Procedure Laterality Date  . AMPUTATION Left 02/02/2014   Procedure: AMPUTATION ABOVE KNEE-LEFT;  Surgeon: Elam Dutch, MD;  Location: [facility];  Service: Vascular;  Laterality: Left;  . APPLICATION OF WOUND VAC Left 03/31/2014   Procedure: APPLICATION OF WOUND VAC - LEFT AKA;  Surgeon: Elam Dutch, MD;  Location: [facility];  Service: Vascular;  Laterality: Left;  . CAPD INSERTION  09/12/2011   Procedure: LAPAROSCOPIC INSERTION CONTINUOUS AMBULATORY PERITONEAL DIALYSIS  (CAPD) CATHETER;  Surgeon: Edward Jolly, MD;  Location: [facility];  Service: General;  Laterality: N/A;  . CAPD REMOVAL  03/12/2012   Procedure: CONTINUOUS AMBULATORY PERITONEAL DIALYSIS  (CAPD) CATHETER REMOVAL;  Surgeon: Edward Jolly, MD;  Location: [place];  Service: General;  Laterality: N/A;  . COLON SURGERY  2011   colon resection   . COLONOSCOPY WITH PROPOFOL N/A 07/14/2014   Procedure: COLONOSCOPY WITH PROPOFOL;  Surgeon: Jerene Bears, MD;  Location: [facility] ENDOSCOPY;  Service: Gastroenterology;  Laterality: N/A;  . DG AV DIALYSIS SHUNT INTRO NDL*R* OR     "not working" (09/19/11)  . ESOPHAGOGASTRODUODENOSCOPY Left 11/28/2015   Procedure: ESOPHAGOGASTRODUODENOSCOPY (EGD);  Surgeon: Juanita Craver, MD;  Location: [facility] ENDOSCOPY;  Service: Endoscopy;  Laterality: Left;  . EUS N/A 01/06/2016   Procedure: UPPER ENDOSCOPIC ULTRASOUND (EUS) LINEAR;  Surgeon: Milus Banister, MD;  Location: [facility] ENDOSCOPY;  Service: Endoscopy;  Laterality: N/A;  . I&D EXTREMITY Left 03/31/2014   Procedure: IRRIGATION AND DEBRIDEMENT EXTREMITY - LEFT ABOVE KNEE AMPUTATION;  Surgeon: Elam Dutch, MD;  Location: [place];  Service: Vascular;  Laterality: Left;  . PARATHYROIDECTOMY  2011  . SP AV DIALYSIS SHUNT ACCESS EXISTING *L*  2003       Home Medications    Prior to Admission medications   Medication Sig Start Date End Date Taking? Authorizing Provider  acetaminophen (TYLENOL) 325 MG tablet Take 650 mg by mouth every 6 (six) hours as  needed for mild pain, fever or headache (DO NOT EXCEED 4 GM OF APAP IN 24 HOURS FORM ALL SOURCES).    Yes Historical Provider, MD  Amino Acids-Protein Hydrolys (FEEDING SUPPLEMENT, PRO-STAT SUGAR FREE 64,) LIQD Take 30 mLs by mouth 2 (two) times daily.   Yes Historical Provider, MD  atorvastatin (LIPITOR) 10 MG tablet Take 10 mg by mouth at bedtime.   Yes Historical Provider, MD  B Complex-C (B-COMPLEX WITH VITAMIN C) tablet Take 1 tablet by mouth daily.   Yes Historical Provider, MD  docusate sodium (COLACE) 100 MG capsule Take 100 mg by mouth at bedtime.    Yes Historical Provider, MD  gabapentin (NEURONTIN) 100 MG capsule Take 100 mg by mouth at bedtime.    Yes Historical Provider, MD  HYDROcodone-acetaminophen (NORCO) 10-325 MG tablet Take 1 tablet by mouth every 4 (four) hours as needed (pain).    Yes Historical Provider, MD  midodrine (PROAMATINE) 5 MG tablet Take 5 mg by mouth See admin instructions. Take 1 tablet (5 mg) by mouth 3 times daily on Sunday, Tuesday, Thursday,  Saturday (7:30am, 2pm, 10pm); take 1 tablet (5 mg) by mouth 2 times daily on dialysis days - Monday, Wednesday, Friday (2pm and 10pm) 02/01/15  Yes Historical Provider, MD  ondansetron (ZOFRAN) 4 MG tablet Take 1 tablet (4 mg total) by mouth every 8 (eight) hours. Patient taking differently: Take 4 mg by mouth every 8 (eight) hours as needed for nausea or vomiting.  01/20/16  Yes Dinah C Ngetich, NP  promethazine (PHENERGAN) 25 MG suppository Place 25 mg rectally every 6 (six) hours as needed for nausea or vomiting.   Yes Historical Provider, MD  sevelamer carbonate (RENVELA) 800 MG tablet Take 800 mg by mouth See admin instructions. Take 1 tablet (800 mg) by mouth daily with lunch and dinner (12pm -5pm)   Yes Historical Provider, MD  sorbitol 70 % solution Take 30 mLs by mouth daily as needed (severe constipation).   Yes Historical Provider, MD    Family History Family History  Problem Relation Age of Onset  . Cancer Sister      breast  . Cancer Brother     lung  . Anesthesia problems Neg Hx   . Hypotension Neg Hx   . Malignant hyperthermia Neg Hx   . Pseudochol deficiency Neg Hx     Social History Social History  Substance Use Topics  . Smoking status: Former Smoker    Packs/day: 0.25    Years: 7.00    Types: Cigarettes    Quit date: 06/12/1976  . Smokeless tobacco: Never Used  . Alcohol use No     Allergies   Benazepril hcl   Review of Systems Review of Systems  Constitutional: Positive for fatigue. Negative for chills and fever.  HENT: Negative for congestion and rhinorrhea.   Eyes: Negative for visual disturbance.  Respiratory: Negative for cough, shortness of breath and wheezing.   Cardiovascular: Negative for chest pain and leg swelling.  Gastrointestinal: Positive for nausea and vomiting. Negative for abdominal pain and diarrhea.  Genitourinary: Negative for dysuria and flank pain.  Musculoskeletal: Negative for neck pain and neck stiffness.  Skin: Negative for rash and wound.  Allergic/Immunologic: Negative for immunocompromised state.  Neurological: Negative for syncope, weakness and headaches.     Physical Exam Updated Vital Signs BP (!) 92/51 (BP Location: Right Arm)   Pulse 84   Temp 98.3 F (36.8 C) (Oral)   Resp 17   Wt 130 lb 11.7 oz (59.3 kg)   SpO2 98%   BMI 21.76 kg/m   Physical Exam  Constitutional: He is oriented to person, place, and time. He appears well-developed. No distress.  Elderly, chronically ill-appearing  HENT:  Head: Normocephalic and atraumatic.  Mouth/Throat: Oropharynx is clear and moist.  Eyes: Conjunctivae are normal. Pupils are equal, round, and reactive to light.  Neck: Neck supple.  Cardiovascular: Normal rate, regular rhythm and normal heart sounds.  Exam reveals no friction rub.   No murmur heard. Pulmonary/Chest: Effort normal and breath sounds normal. No respiratory distress. He has no wheezes.  Abdominal: He exhibits no  distension. There is tenderness (Mild, epigastric). There is no rebound and no guarding.  Musculoskeletal: He exhibits no edema.  Neurological: He is alert and oriented to person, place, and time. He exhibits normal muscle tone.  Skin: Skin is warm. Capillary refill takes less than 2 seconds.  Nursing note and vitals reviewed.    ED Treatments / Results  Labs (all labs ordered are listed, but only abnormal results are displayed) Labs Reviewed  CBC -  Abnormal; Notable for the following:       Result Value   WBC 11.8 (*)    RBC 3.98 (*)    Hemoglobin 10.6 (*)    HCT 33.3 (*)    RDW 19.4 (*)    All other components within normal limits  COMPREHENSIVE METABOLIC PANEL - Abnormal; Notable for the following:    Potassium 3.2 (*)    Chloride 85 (*)    Glucose, Bld 105 (*)    BUN 39 (*)    Creatinine, Ser 8.69 (*)    Calcium 7.6 (*)    Albumin 2.7 (*)    ALT 12 (*)    Alkaline Phosphatase 238 (*)    Total Bilirubin 1.9 (*)    GFR calc non Af Amer 5 (*)    GFR calc Af Amer 6 (*)    Anion gap 22 (*)    All other components within normal limits  TROPONIN I - Abnormal; Notable for the following:    Troponin I 0.08 (*)    All other components within normal limits  GLUCOSE, CAPILLARY - Abnormal; Notable for the following:    Glucose-Capillary 114 (*)    All other components within normal limits  I-STAT TROPOININ, ED - Abnormal; Notable for the following:    Troponin i, poc 0.09 (*)    All other components within normal limits  I-STAT CG4 LACTIC ACID, ED - Abnormal; Notable for the following:    Lactic Acid, Venous 2.23 (*)    All other components within normal limits  I-STAT CHEM 8, ED - Abnormal; Notable for the following:    Potassium 3.1 (*)    Chloride 87 (*)    BUN 38 (*)    Creatinine, Ser 7.70 (*)    Glucose, Bld 105 (*)    Calcium, Ion 0.82 (*)    Hemoglobin 12.9 (*)    HCT 38.0 (*)    All other components within normal limits  I-STAT CG4 LACTIC ACID, ED - Abnormal;  Notable for the following:    Lactic Acid, Venous 2.07 (*)    All other components within normal limits  MRSA PCR SCREENING  CULTURE, BLOOD (ROUTINE X 2)  CULTURE, BLOOD (ROUTINE X 2)  C DIFFICILE QUICK SCREEN W PCR REFLEX  LIPASE, BLOOD  TROPONIN I  LACTIC ACID, PLASMA  CBC  COMPREHENSIVE METABOLIC PANEL  TROPONIN I  CBG MONITORING, ED    EKG  EKG Interpretation  Date/Time:  Friday January 21 2016 13:01:39 EDT Ventricular Rate:  95 PR Interval:  170 QRS Duration: 150 QT Interval:  464 QTC Calculation: 583 R Axis:   8 Text Interpretation:  Sinus rhythm with Premature atrial complexes with Abberant conduction Left bundle branch block Abnormal ECG Old LBBB No significant change since last tracing Confirmed by Layney Gillson MD, Tyke Outman [id number]) on 01/21/2016 1:10:33 PM       Radiology Dg Chest 2 View  Result Date: 01/21/2016 CLINICAL DATA:  Pt has been having chest pains and SOB.Hx of CHF, DM, HTN, renal insufficiency, peripheral neuropathy, dialysis patient EXAM: CHEST  2 VIEW COMPARISON:  11/22/2015 FINDINGS: Cardiac silhouette is mildly enlarged. The aorta is uncoiled. No mediastinal or hilar masses or evidence of adenopathy. Elevation of left hemidiaphragm. There is atelectasis at the left lung base and lateral to the left hilum. No convincing pneumonia. No evidence of pulmonary edema. No pleural effusion or pneumothorax. Bony thorax is demineralized but grossly intact. IMPRESSION: No acute cardiopulmonary disease. Electronically Signed   By: Shanon Brow  Ormond M.D.   On: 01/21/2016 14:54   Ct Abdomen Pelvis W Contrast  Result Date: 01/21/2016 CLINICAL DATA:  Patient has been vomiting for about a week, and generalize weakness. He has been on dialysis for 15 years EXAM: CT ABDOMEN AND PELVIS WITH CONTRAST TECHNIQUE: Multidetector CT imaging of the abdomen and pelvis was performed using the standard protocol following bolus administration of intravenous contrast. CONTRAST:  47mL ISOVUE-300  IOPAMIDOL (ISOVUE-300) INJECTION 61% COMPARISON:  11/22/2015 FINDINGS: Lung bases: Heart is mildly enlarged. There are coronary artery calcifications. Multiple calcified nodes are noted along the hila. Patchy areas of opacity are noted in the lungs. There is a 2.6 x 2.5 cm focal opacity in the left lower lobe. There is a 7 mm focal opacity in the posterior left lower lobe, image 2, series 3. Patchy areas of focal opacity noted in the right lower lobe. There are minimal pleural effusions. Pancreas: Ill-defined mass extends throughout most of the pancreatic head, neck and body and uncinate process, surrounding the superior mesenteric artery and celiac artery and branch vessels. Mass abuts and may invade the anterior aspect of the aorta. Mass abuts the posterior margin of the stomach. It measures 4.4 cm in thickness at this level, but also extends along the gastrohepatic ligament to contact the pylorus and first and second portions of the duodenum. There are adjacent prominent to mildly enlarged lymph nodes along the gastrohepatic ligament and peripancreatic and periceliac chains. The tail the pancreas is atrophic with cystic dilation of the duct. 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 The stomach is significantly distended, which may be due to a gastroparesis or partial mechanical obstruction, due to the pancreatic mass. The ligament of Treitz region of the bowel is in close proximity to the mass and may be involved. Hepatobiliary: No liver mass or focal lesion. Gallbladder is distended. Small amount of sludge is evident dependently. No evidence acute cholecystitis. No bile duct dilation. Spleen: Mildly enlarged measuring 13.5 cm. No mass or focal lesion. Adrenal glands:  No masses. Kidneys, ureters, bladder: Marked renal atrophy with numerous bilateral renal cysts consistent with acquired renal cystic disease. No hydronephrosis.  Bladder is decompressed. Vascular: Extensive arterial vascular calcifications noted along the aorta and throughout all branch vessels. Ascites: Trace amount of ascites seen adjacent to the liver and spleen. Prostate gland:  Enlarged and heterogeneous. Gastrointestinal: Distention of the distal esophagus with mild wall thickening. No evidence of small bowel or colonic obstruction. No wall thickening or inflammatory changes. There are prominent vessels throughout the mesenteric. Abdominal wall:  Midline scarring.  Diffuse subcutaneous edema. Musculoskeletal: Findings of renal osteodystrophy. Numerous Schmorl's nodes throughout the visualized spine. Large cysts along the acetabula. IMPRESSION: 1. As noted on the prior CT, an ill-defined mass is centered on the pancreas involving the head, neck, body and uncinate process. Mass surrounds the celiac axis and superior mesenteric vessels, abuts and possibly invades the anterior aspect of the abdominal aorta and abuts the posterior margin of the stomach. It extends to abut the first and second portions of the duodenum. 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 This is highly suspicious for infiltrative pancreatic carcinoma. 2. Stomach is distended. This suggests either partial functional obstruction of the stomach or possibly mechanical obstruction in the region of the ligament of Treitz. 3. Trace amount of ascites. No definitive peritoneal carcinomatosis, although this is not excluded. 4. Lung base opacities that may be due  to atelectasis or infection or a combination. Neoplastic/ metastatic disease is possible. Small pleural effusions. 5. Multiple chronic findings including end-stage renal disease and bony changes of renal osteodystrophy. Electronically Signed   By: Lajean Manes M.D.   On: 01/21/2016 15:48    Procedures Procedures (including critical care time)  Medications Ordered in ED Medications  piperacillin-tazobactam  (ZOSYN) IVPB 3.375 g (3.375 g Intravenous New Bag/Given 01/21/16 2223)  atorvastatin (LIPITOR) tablet 10 mg (10 mg Oral Given 01/21/16 2222)  acetaminophen (TYLENOL) tablet 650 mg (650 mg Oral Given 01/22/16 0651)  gabapentin (NEURONTIN) capsule 100 mg (100 mg Oral Given 01/21/16 2222)  0.9 %  sodium chloride infusion (not administered)  ondansetron (ZOFRAN) tablet 4 mg (not administered)    Or  ondansetron (ZOFRAN) injection 4 mg (not administered)  HYDROmorphone (DILAUDID) injection 0.5 mg (not administered)  fentaNYL (SUBLIMAZE) injection 25-50 mcg (not administered)  sevelamer carbonate (RENVELA) tablet 800 mg (not administered)  midodrine (PROAMATINE) tablet 5 mg (5 mg Oral Given 01/22/16 0928)    And  midodrine (PROAMATINE) tablet 5 mg (not administered)  heparin injection 5,000 Units (5,000 Units Subcutaneous Given 01/22/16 0653)  feeding supplement (BOOST / RESOURCE BREEZE) liquid 1 Container (not administered)  menthol-cetylpyridinium (CEPACOL) lozenge 3 mg (not administered)  sodium chloride 0.9 % bolus 250 mL (0 mLs Intravenous Stopped 01/21/16 1504)  iopamidol (ISOVUE-300) 61 % injection (80 mLs  Contrast Given 01/21/16 1458)  vancomycin (VANCOCIN) 1,500 mg in sodium chloride 0.9 % 500 mL IVPB (0 mg Intravenous Stopped 01/21/16 1747)  potassium chloride SA (K-DUR,KLOR-CON) CR tablet 40 mEq (40 mEq Oral Given 01/21/16 2222)     Emergency Ultrasound Study:   Angiocath insertion Performed by: Evonnie Pat  Consent: Verbal consent obtained. Risks and benefits: risks, benefits and alternatives were discussed Immediately prior to procedure the correct patient, procedure, equipment, support staff and site/side marked as needed.  Indication: difficult IV access Preparation: Patient was prepped and draped in the usual sterile fashion. Vein Location: Right forearm vein was visualized during assessment for potential access sites and was found to be patent/ easily compressed with linear  ultrasound.  The needle was visualized with real-time ultrasound and guided into the vein. Gauge: 20  Image saved and stored.  Normal blood return.  Patient tolerance: Patient tolerated the procedure well with no immediate complications.       Initial Impression / Assessment and Plan / ED Course  I have reviewed the triage vital signs and the nursing notes.  Pertinent labs & imaging results that were available during my care of the patient were reviewed by me and considered in my medical decision making (see chart for details).  Clinical Course    80 yo AAM with PMHx of ESRD on MWF HD, HTN, HLD, CAD, recent diagnosis of pancreatic adenoCA who presents with general fatigue, nausea, vomiting, and inability to tolerate PO intake. On arrival, VSS; pt mildly hypotensive but SBP>90, MAP>65. Initial DDx is broad and includes sepsis, hyperkalemia, uremia, fluid overload, worsening pancreatic adenoCA, or intra-abdominal infection. iStat labs show lactate elevated at 2.07. Will start empiric vanc/zosyn, obtain broad labs including CT A/P. Pt does not make urine so will give CT contrast. CXR shows no acute abnormality. Trop elevated but likely 2/2 ESRD, demand - EKG is non-ischemic.  Labs, imaging as above. CBC shows leukocytosis of 11.8. CMP with hypokalemia, otherwise largely acceptable lytes despite ESRD - no indication for emergent HD. Cx sent. CT scan shows atelectasis versus PNA, enlarging pancreatic mass with  possible mass effect and subsequent obstruction of duodenum. No vomiting currently. Will admit to [facility], continue ABX.  Final Clinical Impressions(s) / ED Diagnoses   Final diagnoses:  Sepsis, due to unspecified organism [facility])  Dehydration  Hypokalemia  Generalized weakness  Pancreatic adenocarcinoma [facility])    New Prescriptions Current Discharge Medication List       Duffy Bruce, MD 01/22/16 1052

## 2016-01-21 NOTE — H&P (Addendum)
TRH H&P   Patient Demographics:    Sean [name], is a 80 y.o. male  MRN: [id number]  DOB - October 04, 1935  Admit Date - 01/21/2016  Outpatient Primary MD for the patient is [name], MD  Referring MD/NP/PA: Dr Myrene Buddy  Patient coming from: SNF  Chief Complaint  Patient presents with  . Weakness  . Anorexia  . Chest Pain  . Emesis      HPI:    Sean [name]  is a 80 y.o. 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 Mike Craze, recommended hospice. Patient is not a candidate for surgery, and does not want chemotherapy. Patient undergoes dialysis Monday Wednesday Friday, and he missed dialysis on Wednesday and Friday. Patient says that he was a nausea and vomiting and was feeling extremely weak, so he did not go for hemodialysis. He admits to having chest pain, denies shortness of breath. Has poor by mouth intake. CT abdomen and pelvis was done in the ED which showed pancreatic mass, lung opacities bilaterally which could be infectious process. Patient empirically started on vancomycin and Zosyn for possible sepsis. No fluid boluses given as patient has ESRD, and vitals are stable at this time.    Review of systems:    In addition to the HPI above,  No Fever-chills, No Headache, No changes with Vision or hearing, No Chest pain, Cough or Shortness of Breath, No new skin rashes or bruises, No new joints pains-aches,  No new weakness, tingling, numbness in any extremity, No recent weight gain or loss, No polyuria, polydypsia or polyphagia, No significant Mental Stressors.  A full 10 point Review of Systems was done, except as stated above, all other Review of Systems were negative.   With Past History of the following :    Past Medical History:  Diagnosis Date  .  Arthritis   . CAD (coronary artery disease)    a. s/p NSTEMI and failed RCA PCI in 2011. b. Nuc 12/2013: nonischemic, prior inferior MI.  . Calciphylaxis    a. Multiple admissions for nonhealing L ulcer/sepsis, s/p L AKA 02/02/2014.  Marland Kitchen CHF (congestive heart failure) ([place])   . Cholelithiasis 01/2014   acute cholecystitis managed with perc drain due to high surgical risk.   . Chronic anemia   . Dementia   . Depression with anxiety   . Dialysis patient [facility])    M, W, F; [place] ESRD (end stage renal disease) on dialysis ([place])   . GERD (gastroesophageal reflux disease)   . Heart attack ([place]) 2011; 1990's  . History of left bundle branch block   . Hx of above knee amputation ([place])    left  . Hyperlipidemia   . Hyperparathyroidism   . Hypertension   . Hypotension    renovascular  . LBBB (left bundle branch block)   . Migraine    none recent  . Pancreatic mass   .  Peripheral neuropathy ([place])   . Renal insufficiency   . Stroke [facility])    several, last cva 2015 or 2014  . Type II diabetes mellitus ([place])    not taking any medications.      Past Surgical History:  Procedure Laterality Date  . AMPUTATION Left 02/02/2014   Procedure: AMPUTATION ABOVE KNEE-LEFT;  Surgeon: Elam Dutch, MD;  Location: [place];  Service: Vascular;  Laterality: Left;  . APPLICATION OF WOUND VAC Left 03/31/2014   Procedure: APPLICATION OF WOUND VAC - LEFT AKA;  Surgeon: Elam Dutch, MD;  Location: [place];  Service: Vascular;  Laterality: Left;  . CAPD INSERTION  09/12/2011   Procedure: LAPAROSCOPIC INSERTION CONTINUOUS AMBULATORY PERITONEAL DIALYSIS  (CAPD) CATHETER;  Surgeon: Edward Jolly, MD;  Location: [place];  Service: General;  Laterality: N/A;  . CAPD REMOVAL  03/12/2012   Procedure: CONTINUOUS AMBULATORY PERITONEAL DIALYSIS  (CAPD) CATHETER REMOVAL;  Surgeon: Edward Jolly, MD;  Location: [place];  Service: General;  Laterality: N/A;  . COLON SURGERY  2011   colon resection     . COLONOSCOPY WITH PROPOFOL N/A 07/14/2014   Procedure: COLONOSCOPY WITH PROPOFOL;  Surgeon: Jerene Bears, MD;  Location: [facility] ENDOSCOPY;  Service: Gastroenterology;  Laterality: N/A;  . DG AV DIALYSIS SHUNT INTRO NDL*R* OR     "not working" (09/19/11)  . ESOPHAGOGASTRODUODENOSCOPY Left 11/28/2015   Procedure: ESOPHAGOGASTRODUODENOSCOPY (EGD);  Surgeon: Juanita Craver, MD;  Location: [facility] ENDOSCOPY;  Service: Endoscopy;  Laterality: Left;  . EUS N/A 01/06/2016   Procedure: UPPER ENDOSCOPIC ULTRASOUND (EUS) LINEAR;  Surgeon: Milus Banister, MD;  Location: [facility] ENDOSCOPY;  Service: Endoscopy;  Laterality: N/A;  . I&D EXTREMITY Left 03/31/2014   Procedure: IRRIGATION AND DEBRIDEMENT EXTREMITY - LEFT ABOVE KNEE AMPUTATION;  Surgeon: Elam Dutch, MD;  Location: [place];  Service: Vascular;  Laterality: Left;  . PARATHYROIDECTOMY  2011  . SP AV DIALYSIS SHUNT ACCESS EXISTING *L*  2003      Social History:     Social History  Substance Use Topics  . Smoking status: Former Smoker    Packs/day: 0.25    Years: 7.00    Types: Cigarettes    Quit date: 06/12/1976  . Smokeless tobacco: Never Used  . Alcohol use No        Family History :     Family History  Problem Relation Age of Onset  . Cancer Sister     breast  . Cancer Brother     lung  . Anesthesia problems Neg Hx   . Hypotension Neg Hx   . Malignant hyperthermia Neg Hx   . Pseudochol deficiency Neg Hx       Home Medications:   Prior to Admission medications   Medication Sig Start Date End Date Taking? Authorizing Provider  acetaminophen (TYLENOL) 325 MG tablet Take 650 mg by mouth every 6 (six) hours as needed for mild pain, fever or headache (DO NOT EXCEED 4 GM OF APAP IN 24 HOURS FORM ALL SOURCES).    Yes Historical Provider, MD  Amino Acids-Protein Hydrolys (FEEDING SUPPLEMENT, PRO-STAT SUGAR FREE 64,) LIQD Take 30 mLs by mouth 2 (two) times daily.   Yes Historical Provider, MD  atorvastatin (LIPITOR) 10 MG tablet Take 10 mg by  mouth at bedtime.   Yes Historical Provider, MD  B Complex-C (B-COMPLEX WITH VITAMIN C) tablet Take 1 tablet by mouth daily.   Yes Historical Provider, MD  docusate sodium (COLACE) 100 MG capsule  Take 100 mg by mouth at bedtime.    Yes Historical Provider, MD  gabapentin (NEURONTIN) 100 MG capsule Take 100 mg by mouth at bedtime.    Yes Historical Provider, MD  HYDROcodone-acetaminophen (NORCO) 10-325 MG tablet Take 1 tablet by mouth every 4 (four) hours as needed (pain).    Yes Historical Provider, MD  midodrine (PROAMATINE) 5 MG tablet Take 5 mg by mouth See admin instructions. Take 1 tablet (5 mg) by mouth 3 times daily on Sunday, Tuesday, Thursday, Saturday (7:30am, 2pm, 10pm); take 1 tablet (5 mg) by mouth 2 times daily on dialysis days - Monday, Wednesday, Friday (2pm and 10pm) 02/01/15  Yes Historical Provider, MD  ondansetron (ZOFRAN) 4 MG tablet Take 1 tablet (4 mg total) by mouth every 8 (eight) hours. Patient taking differently: Take 4 mg by mouth every 8 (eight) hours as needed for nausea or vomiting.  01/20/16  Yes Dinah C Ngetich, NP  promethazine (PHENERGAN) 25 MG suppository Place 25 mg rectally every 6 (six) hours as needed for nausea or vomiting.   Yes Historical Provider, MD  sevelamer carbonate (RENVELA) 800 MG tablet Take 800 mg by mouth See admin instructions. Take 1 tablet (800 mg) by mouth daily with lunch and dinner (12pm -5pm)   Yes Historical Provider, MD  sorbitol 70 % solution Take 30 mLs by mouth daily as needed (severe constipation).   Yes Historical Provider, MD     Allergies:     Allergies  Allergen Reactions  . Benazepril Hcl Hives, Itching and Rash    flareups on patient head     Physical Exam:   Vitals  Blood pressure 98/63, pulse 91, resp. rate 25, SpO2 100 %.   1. General African-American male* lying in bed in NAD, cooperative with exam  2. Normal affect and insight, Awake Alert, Oriented X 3.  3. No F.N deficits  4. Ears and Eyes appear Normal,  Conjunctivae clear, PERRLA. Moist Oral Mucosa.  5. Supple Neck, No JVD, No cervical lymphadenopathy appriciated, No Carotid Bruits.  6. Symmetrical Chest wall movement, Good air movement bilaterally, CTAB.  7. RRR, No Gallops, Rubs or Murmurs, No Parasternal Heave.No Leg edema  8. Positive Bowel Sounds, Abdomen Soft, mild epigastric tenderness to palpation , No organomegaly appriciated,No rebound -guarding or rigidity.  9.  No Cyanosis, Normal Skin Turgor, No Skin Rash or Bruise.  10. Good muscle tone, status post right AKA      Data Review:    CBC  Recent Labs Lab 01/21/16 1342 01/21/16 1356  WBC 11.8*  --   HGB 10.6* 12.9*  HCT 33.3* 38.0*  PLT 197  --   MCV 83.7  --   MCH 26.6  --   MCHC 31.8  --   RDW 19.4*  --    ------------------------------------------------------------------------------------------------------------------  Chemistries   Recent Labs Lab 01/21/16 1342 01/21/16 1356  NA 139 137  K 3.2* 3.1*  CL 85* 87*  CO2 32  --   GLUCOSE 105* 105*  BUN 39* 38*  CREATININE 8.69* 7.70*  CALCIUM 7.6*  --   AST 30  --   ALT 12*  --   ALKPHOS 238*  --   BILITOT 1.9*  --    ------------------------------------------------------------------------------------------------------------------  ------------------------------------------------------------------------------------------------------------------ GFR: Estimated Creatinine Clearance: 6.9 mL/min (by C-G formula based on SCr of 7.7 mg/dL). Liver Function Tests:  Recent Labs Lab 01/21/16 1342  AST 30  ALT 12*  ALKPHOS 238*  BILITOT 1.9*  PROT 8.0  ALBUMIN 2.7*  Recent Labs Lab 01/21/16 1342  LIPASE 48   CBG:  Recent Labs Lab 01/21/16 1320  GLUCAP 97    --------------------------------------------------------------------------------------------------------------- Urine analysis: No results found for: COLORURINE, APPEARANCEUR, LABSPEC, PHURINE, GLUCOSEU, HGBUR, BILIRUBINUR,  KETONESUR, PROTEINUR, UROBILINOGEN, NITRITE, LEUKOCYTESUR    ----------------------------------------------------------------------------------------------------------------   Imaging Results:    Dg Chest 2 View  Result Date: 01/21/2016 CLINICAL DATA:  Pt has been having chest pains and SOB.Hx of CHF, DM, HTN, renal insufficiency, peripheral neuropathy, dialysis patient EXAM: CHEST  2 VIEW COMPARISON:  11/22/2015 FINDINGS: Cardiac silhouette is mildly enlarged. The aorta is uncoiled. No mediastinal or hilar masses or evidence of adenopathy. Elevation of left hemidiaphragm. There is atelectasis at the left lung base and lateral to the left hilum. No convincing pneumonia. No evidence of pulmonary edema. No pleural effusion or pneumothorax. Bony thorax is demineralized but grossly intact. IMPRESSION: No acute cardiopulmonary disease. Electronically Signed   By: Lajean Manes M.D.   On: 01/21/2016 14:54   Ct Abdomen Pelvis W Contrast  Result Date: 01/21/2016 CLINICAL DATA:  Patient has been vomiting for about a week, and generalize weakness. He has been on dialysis for 15 years EXAM: CT ABDOMEN AND PELVIS WITH CONTRAST TECHNIQUE: Multidetector CT imaging of the abdomen and pelvis was performed using the standard protocol following bolus administration of intravenous contrast. CONTRAST:  48mL ISOVUE-300 IOPAMIDOL (ISOVUE-300) INJECTION 61% COMPARISON:  11/22/2015 FINDINGS: Lung bases: Heart is mildly enlarged. There are coronary artery calcifications. Multiple calcified nodes are noted along the hila. Patchy areas of opacity are noted in the lungs. There is a 2.6 x 2.5 cm focal opacity in the left lower lobe. There is a 7 mm focal opacity in the posterior left lower lobe, image 2, series 3. Patchy areas of focal opacity noted in the right lower lobe. There are minimal pleural effusions. Pancreas: Ill-defined mass extends throughout most of the pancreatic head, neck and body and uncinate process, surrounding  the superior mesenteric artery and celiac artery and branch vessels. Mass abuts and may invade the anterior aspect of the aorta. Mass abuts the posterior margin of the stomach. It measures 4.4 cm in thickness at this level, but also extends along the gastrohepatic ligament to contact the pylorus and first and second portions of the duodenum. There are adjacent prominent to mildly enlarged lymph nodes along the gastrohepatic ligament and peripancreatic and periceliac chains. The tail the pancreas is atrophic with cystic dilation of the duct. 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 The stomach is significantly distended, which may be due to a gastroparesis or partial mechanical obstruction, due to the pancreatic mass. The ligament of Treitz region of the bowel is in close proximity to the mass and may be involved. Hepatobiliary: No liver mass or focal lesion. Gallbladder is distended. Small amount of sludge is evident dependently. No evidence acute cholecystitis. No bile duct dilation. Spleen: Mildly enlarged measuring 13.5 cm. No mass or focal lesion. Adrenal glands:  No masses. Kidneys, ureters, bladder: Marked renal atrophy with numerous bilateral renal cysts consistent with acquired renal cystic disease. No hydronephrosis. Bladder is decompressed. Vascular: Extensive arterial vascular calcifications noted along the aorta and throughout all branch vessels. Ascites: Trace amount of ascites seen adjacent to the liver and spleen. Prostate gland:  Enlarged and heterogeneous. Gastrointestinal: Distention of the distal esophagus with mild wall thickening. No evidence of small bowel or colonic obstruction. No wall thickening or inflammatory changes. There are prominent vessels  throughout the mesenteric. Abdominal wall:  Midline scarring.  Diffuse subcutaneous edema. Musculoskeletal: Findings of renal osteodystrophy. Numerous Schmorl's  nodes throughout the visualized spine. Large cysts along the acetabula. IMPRESSION: 1. As noted on the prior CT, an ill-defined mass is centered on the pancreas involving the head, neck, body and uncinate process. Mass surrounds the celiac axis and superior mesenteric vessels, abuts and possibly invades the anterior aspect of the abdominal aorta and abuts the posterior margin of the stomach. It extends to abut the first and second portions of the duodenum. 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 This is highly suspicious for infiltrative pancreatic carcinoma. 2. Stomach is distended. This suggests either partial functional obstruction of the stomach or possibly mechanical obstruction in the region of the ligament of Treitz. 3. Trace amount of ascites. No definitive peritoneal carcinomatosis, although this is not excluded. 4. Lung base opacities that may be due to atelectasis or infection or a combination. Neoplastic/ metastatic disease is possible. Small pleural effusions. 5. Multiple chronic findings including end-stage renal disease and bony changes of renal osteodystrophy. Electronically Signed   By: Lajean Manes M.D.   On: 01/21/2016 15:48    My personal review of EKG: Rhythm NSR   Assessment & Plan:    Active Problems:   Nausea & vomiting   ? Sepsis Pancreatic cancer   ESRD on hemodialysis   Edema   1. Sepsis- patient came with lactate 2.23, blood pressure 90/60, CT abdomen pelvis showed lung opacities at the lung bases, which could be combination of atelectasis or infection. Patient empirically started on vancomycin and Zosyn. Will recheck lactate, blood cultures 2. No IV fluid boluses, as patient has ESRD, and his blood pressure is stable. 2. Pancreatic cancer- patient recently diagnosed with adenocarcinoma of pancreas, locally advanced disease. Has been seen by oncology, and referred to hospice. Not a surgical candidate, patient refused chemotherapy.  We'll start Dilaudid 0.5 mg every 4 hours when necessary for pain 3. ESRD on hemodialysis- called and discussed with Dr. Jonnie Finner, he will get hemodialysis in a.m. 4. Nausea and vomiting- likely from underlying pancreatic cancer, will start Zofran when necessary. 5. Hypokalemia- appears potassium and check BMP in a.m.   Goals of care- discussed in detail with patient and his daughter at bedside, patient has a clear understanding of his disease process and poor prognosis. He wants to continue hemodialysis as long as he can, and would stop hemodialysis when he becomes too sick to undergo hemodialysis. Patient has a poor prognosis  and has been referred to hospice.   DVT Prophylaxis-   Lovenox   AM Labs Ordered, also please review Full Orders  Family Communication: Admission, patients condition and plan of care including tests being ordered have been discussed with the patient and his daughter at bedside* who indicate understanding and agree with the plan and Code Status.  Code Status:  Full code  Admission status: Observation  Time spent in minutes : 60 min   Sean [name] S M.D on 01/21/2016 at 5:40 PM  Between 7am to 7pm - Pager - [phone]. After 7pm go to [email or web address] - password [facility]  [facility] - Office  [phone]

## 2016-01-22 DIAGNOSIS — Z79899 Other long term (current) drug therapy: Secondary | ICD-10-CM | POA: Diagnosis not present

## 2016-01-22 DIAGNOSIS — B9689 Other specified bacterial agents as the cause of diseases classified elsewhere: Secondary | ICD-10-CM | POA: Diagnosis not present

## 2016-01-22 DIAGNOSIS — Z992 Dependence on renal dialysis: Secondary | ICD-10-CM | POA: Diagnosis not present

## 2016-01-22 DIAGNOSIS — Z7189 Other specified counseling: Secondary | ICD-10-CM | POA: Diagnosis not present

## 2016-01-22 DIAGNOSIS — Z6821 Body mass index (BMI) 21.0-21.9, adult: Secondary | ICD-10-CM | POA: Diagnosis not present

## 2016-01-22 DIAGNOSIS — I252 Old myocardial infarction: Secondary | ICD-10-CM | POA: Diagnosis not present

## 2016-01-22 DIAGNOSIS — Z888 Allergy status to other drugs, medicaments and biological substances status: Secondary | ICD-10-CM | POA: Diagnosis not present

## 2016-01-22 DIAGNOSIS — I9589 Other hypotension: Secondary | ICD-10-CM | POA: Diagnosis present

## 2016-01-22 DIAGNOSIS — E1122 Type 2 diabetes mellitus with diabetic chronic kidney disease: Secondary | ICD-10-CM | POA: Diagnosis present

## 2016-01-22 DIAGNOSIS — I509 Heart failure, unspecified: Secondary | ICD-10-CM | POA: Diagnosis present

## 2016-01-22 DIAGNOSIS — K869 Disease of pancreas, unspecified: Secondary | ICD-10-CM | POA: Diagnosis not present

## 2016-01-22 DIAGNOSIS — R112 Nausea with vomiting, unspecified: Secondary | ICD-10-CM | POA: Diagnosis present

## 2016-01-22 DIAGNOSIS — A047 Enterocolitis due to Clostridium difficile: Secondary | ICD-10-CM | POA: Diagnosis present

## 2016-01-22 DIAGNOSIS — N186 End stage renal disease: Secondary | ICD-10-CM | POA: Diagnosis not present

## 2016-01-22 DIAGNOSIS — Z66 Do not resuscitate: Secondary | ICD-10-CM | POA: Diagnosis present

## 2016-01-22 DIAGNOSIS — E860 Dehydration: Secondary | ICD-10-CM | POA: Diagnosis present

## 2016-01-22 DIAGNOSIS — I2510 Atherosclerotic heart disease of native coronary artery without angina pectoris: Secondary | ICD-10-CM | POA: Diagnosis present

## 2016-01-22 DIAGNOSIS — D638 Anemia in other chronic diseases classified elsewhere: Secondary | ICD-10-CM | POA: Diagnosis present

## 2016-01-22 DIAGNOSIS — G43A0 Cyclical vomiting, not intractable: Secondary | ICD-10-CM | POA: Diagnosis not present

## 2016-01-22 DIAGNOSIS — Z789 Other specified health status: Secondary | ICD-10-CM | POA: Diagnosis not present

## 2016-01-22 DIAGNOSIS — F0390 Unspecified dementia without behavioral disturbance: Secondary | ICD-10-CM | POA: Diagnosis present

## 2016-01-22 DIAGNOSIS — Z87891 Personal history of nicotine dependence: Secondary | ICD-10-CM | POA: Diagnosis not present

## 2016-01-22 DIAGNOSIS — R7989 Other specified abnormal findings of blood chemistry: Secondary | ICD-10-CM

## 2016-01-22 DIAGNOSIS — Z89612 Acquired absence of left leg above knee: Secondary | ICD-10-CM | POA: Diagnosis not present

## 2016-01-22 DIAGNOSIS — E785 Hyperlipidemia, unspecified: Secondary | ICD-10-CM | POA: Diagnosis present

## 2016-01-22 DIAGNOSIS — R778 Other specified abnormalities of plasma proteins: Secondary | ICD-10-CM | POA: Diagnosis present

## 2016-01-22 DIAGNOSIS — E876 Hypokalemia: Secondary | ICD-10-CM | POA: Diagnosis present

## 2016-01-22 DIAGNOSIS — C250 Malignant neoplasm of head of pancreas: Secondary | ICD-10-CM | POA: Diagnosis present

## 2016-01-22 DIAGNOSIS — N2581 Secondary hyperparathyroidism of renal origin: Secondary | ICD-10-CM | POA: Diagnosis present

## 2016-01-22 DIAGNOSIS — I132 Hypertensive heart and chronic kidney disease with heart failure and with stage 5 chronic kidney disease, or end stage renal disease: Secondary | ICD-10-CM | POA: Diagnosis present

## 2016-01-22 DIAGNOSIS — E43 Unspecified severe protein-calorie malnutrition: Secondary | ICD-10-CM | POA: Diagnosis present

## 2016-01-22 LAB — CBC
HEMATOCRIT: 29.7 % — AB (ref 39.0–52.0)
HEMOGLOBIN: 9.2 g/dL — AB (ref 13.0–17.0)
MCH: 25.3 pg — AB (ref 26.0–34.0)
MCHC: 31 g/dL (ref 30.0–36.0)
MCV: 81.8 fL (ref 78.0–100.0)
Platelets: 234 10*3/uL (ref 150–400)
RBC: 3.63 MIL/uL — ABNORMAL LOW (ref 4.22–5.81)
RDW: 18.9 % — ABNORMAL HIGH (ref 11.5–15.5)
WBC: 10.1 10*3/uL (ref 4.0–10.5)

## 2016-01-22 LAB — COMPREHENSIVE METABOLIC PANEL
ALBUMIN: 2.4 g/dL — AB (ref 3.5–5.0)
ALK PHOS: 185 U/L — AB (ref 38–126)
ALT: 12 U/L — ABNORMAL LOW (ref 17–63)
ANION GAP: 20 — AB (ref 5–15)
AST: 22 U/L (ref 15–41)
BILIRUBIN TOTAL: 1.5 mg/dL — AB (ref 0.3–1.2)
BUN: 49 mg/dL — ABNORMAL HIGH (ref 6–20)
CALCIUM: 7.7 mg/dL — AB (ref 8.9–10.3)
CO2: 27 mmol/L (ref 22–32)
Chloride: 86 mmol/L — ABNORMAL LOW (ref 101–111)
Creatinine, Ser: 9.18 mg/dL — ABNORMAL HIGH (ref 0.61–1.24)
GFR calc non Af Amer: 5 mL/min — ABNORMAL LOW (ref >60)
GFR, EST AFRICAN AMERICAN: 6 mL/min — AB (ref >60)
GLUCOSE: 180 mg/dL — AB (ref 65–99)
Potassium: 3.5 mmol/L (ref 3.5–5.1)
Sodium: 133 mmol/L — ABNORMAL LOW (ref 135–145)
TOTAL PROTEIN: 7.2 g/dL (ref 6.5–8.1)

## 2016-01-22 LAB — LACTIC ACID, PLASMA: Lactic Acid, Venous: 1.8 mmol/L (ref 0.5–1.9)

## 2016-01-22 LAB — C DIFFICILE QUICK SCREEN W PCR REFLEX
C DIFFICILE (CDIFF) TOXIN: POSITIVE — AB
C Diff antigen: POSITIVE — AB
C Diff interpretation: DETECTED

## 2016-01-22 LAB — MRSA PCR SCREENING: MRSA by PCR: NEGATIVE

## 2016-01-22 LAB — PHOSPHORUS: Phosphorus: 5.4 mg/dL — ABNORMAL HIGH (ref 2.5–4.6)

## 2016-01-22 LAB — TROPONIN I: Troponin I: 0.07 ng/mL (ref <0.03)

## 2016-01-22 MED ORDER — VANCOMYCIN 50 MG/ML ORAL SOLUTION
125.0000 mg | Freq: Four times a day (QID) | ORAL | Status: DC
  Administered 2016-01-22 – 2016-01-26 (×10): 125 mg via ORAL
  Filled 2016-01-22 (×19): qty 2.5

## 2016-01-22 MED ORDER — GLUCERNA SHAKE PO LIQD
237.0000 mL | Freq: Two times a day (BID) | ORAL | Status: DC
  Administered 2016-01-23 (×2): 237 mL via ORAL

## 2016-01-22 MED ORDER — MENTHOL 3 MG MT LOZG
1.0000 | LOZENGE | OROMUCOSAL | Status: DC | PRN
  Filled 2016-01-22: qty 9

## 2016-01-22 MED ORDER — BOOST / RESOURCE BREEZE PO LIQD
1.0000 | Freq: Three times a day (TID) | ORAL | Status: DC
  Administered 2016-01-22: 1 via ORAL

## 2016-01-22 MED ORDER — PENTAFLUOROPROP-TETRAFLUOROETH EX AERO
1.0000 | INHALATION_SPRAY | CUTANEOUS | Status: DC | PRN
Start: 2016-01-22 — End: 2016-01-22

## 2016-01-22 MED ORDER — ALTEPLASE 2 MG IJ SOLR: 2.0000 mg | Freq: Once | INTRAMUSCULAR | Status: DC | PRN

## 2016-01-22 MED ORDER — SODIUM CHLORIDE 0.9 % IV SOLN: 100.0000 mL | INTRAVENOUS | Status: DC | PRN

## 2016-01-22 MED ORDER — LIDOCAINE HCL (PF) 1 % IJ SOLN: 5.0000 mL | INTRAMUSCULAR | Status: DC | PRN

## 2016-01-22 MED ORDER — FENTANYL CITRATE (PF) 100 MCG/2ML IJ SOLN
INTRAMUSCULAR | Status: AC
  Filled 2016-01-22: qty 2

## 2016-01-22 MED ORDER — LIDOCAINE-PRILOCAINE 2.5-2.5 % EX CREA: 1.0000 "application " | TOPICAL_CREAM | CUTANEOUS | Status: DC | PRN

## 2016-01-22 MED ORDER — HEPARIN SODIUM (PORCINE) 1000 UNIT/ML DIALYSIS: 1000.0000 [IU] | INTRAMUSCULAR | Status: DC | PRN

## 2016-01-22 NOTE — Plan of Care (Signed)
Problem: Bowel/Gastric: Goal: Will not experience complications related to bowel motility Variance: Clinical complication Comments: Received call from lab. Cdiff positive for both antigen and toxin. MD notified. Will continue to monitor.

## 2016-01-22 NOTE — Progress Notes (Signed)
Patient admitted under Observation status.  Will arrange for dialysis today.  If patient status changes to Inpatient status please notify us and we will do a formal consultation.   Rob Delbert Vu MD [facility] pager [phone]    cell [phone] 12/18/2015, 11:45 AM   

## 2016-01-22 NOTE — Consult Note (Deleted)
Renal Service Consult Note [place] 01/22/2016 [place] Requesting Physician:  Dr Dyann Kief  Reason for Consult:  Nausea/ vomiting/ missed HD3 HPI: The patient is a 79 y.o. year-old with hx of DM, HTN, L BKA (calciphylaxis) and ESRD. Also hx ischemic colitis and Cdif. Most recently was found to have panc mass incidental on CT abd done for colitis.  Was seen by Dr Benay Spice and given option of pall chemo but pt declined and wants to continue HD for now but says he will stop dialysis when he isn't strong enough to take it anymore.   Presenting now with nausea, vomiting and anorexia w fatigue.  No fevers. Some loose stools.  No cp or SOB.  Missed HD which is unusual for him.   Discussed code status, pt says he doesn't want heroic measures / life support , etc in event of cardiac/ resp arrest.  Abigail Butts RN present for discussion, will write DNR.     Prior admits: 2015 - chest pain, calciphylaxis LLE, HTN, esrd; lower EF so cards recommended cath but pt declined 2015 - calciphylaxis pain, admitted for pain control 2015 - septic / acute chole sp perc drain to GB, IV abx, plan GB removal later date, po cip/ flag at dc 2015 - L AKA 2015 - ischemic colitis, PSBO, DM, esrd 2017 - May > gallstone pancreatitis/ PSBO/ ileus , seen by gen surg but pt decline surg for now, dc'd 2017 - June > Cdif colitis w sepsis, pancreatic mass (new) on CT, poss involvement SMA/ celiac art, pt declined further diagnostic testing stating he is 80 yo and doesn't want to pursue it   ROS  denies CP  no joint pain   no HA  no blurry vision  no rash  no diarrhea  no nausea/ vomiting  no dysuria  no difficulty voiding  no change in urine color    Past Medical History  Past Medical History:  Diagnosis Date  . Arthritis   . CAD (coronary artery disease)    a. s/p NSTEMI and failed RCA PCI in 2011. b. Nuc 12/2013: nonischemic, prior inferior MI.  . Calciphylaxis    a.  Multiple admissions for nonhealing L ulcer/sepsis, s/p L AKA 02/02/2014.  Marland Kitchen CHF (congestive heart failure) ([place])   . Cholelithiasis 01/2014   acute cholecystitis managed with perc drain due to high surgical risk.   . Chronic anemia   . Dementia   . Depression with anxiety   . Dialysis patient [facility])    M, W, F; [place] ESRD (end stage renal disease) on dialysis ([place])   . GERD (gastroesophageal reflux disease)   . Heart attack ([place]) 2011; 1990's  . History of left bundle branch block   . Hx of above knee amputation ([place])    left  . Hyperlipidemia   . Hyperparathyroidism   . Hypertension   . Hypotension    renovascular  . LBBB (left bundle branch block)   . Migraine    none recent  . Pancreatic mass   . Peripheral neuropathy ([place])   . Renal insufficiency   . Stroke [facility])    several, last cva 2015 or 2014  . Type II diabetes mellitus ([place])    not taking any medications.   Past Surgical History  Past Surgical History:  Procedure Laterality Date  . AMPUTATION Left 02/02/2014   Procedure: AMPUTATION ABOVE KNEE-LEFT;  Surgeon: Elam Dutch, MD;  Location: [place];  Service: Vascular;  Laterality: Left;  . APPLICATION OF WOUND VAC Left 03/31/2014   Procedure: APPLICATION OF WOUND VAC - LEFT AKA;  Surgeon: Elam Dutch, MD;  Location: [place];  Service: Vascular;  Laterality: Left;  . CAPD INSERTION  09/12/2011   Procedure: LAPAROSCOPIC INSERTION CONTINUOUS AMBULATORY PERITONEAL DIALYSIS  (CAPD) CATHETER;  Surgeon: Edward Jolly, MD;  Location: [place];  Service: General;  Laterality: N/A;  . CAPD REMOVAL  03/12/2012   Procedure: CONTINUOUS AMBULATORY PERITONEAL DIALYSIS  (CAPD) CATHETER REMOVAL;  Surgeon: Edward Jolly, MD;  Location: [place];  Service: General;  Laterality: N/A;  . COLON SURGERY  2011   colon resection   . COLONOSCOPY WITH PROPOFOL N/A 07/14/2014   Procedure: COLONOSCOPY WITH PROPOFOL;  Surgeon: Jerene Bears, MD;  Location: [facility];   Service: Gastroenterology;  Laterality: N/A;  . DG AV DIALYSIS SHUNT INTRO NDL*R* OR     "not working" (09/19/11)  . ESOPHAGOGASTRODUODENOSCOPY Left 11/28/2015   Procedure: ESOPHAGOGASTRODUODENOSCOPY (EGD);  Surgeon: Juanita Craver, MD;  Location: [facility] END[facility]COPY;  Service: Endoscopy;  Laterality: Left;  . EUS N/A 01/06/2016   Procedure: UPPER ENDOSCOPIC ULTRASOUND (EUS) LINEAR;  Surgeon: Milus Banister, MD;  Location: [facility];  Service: Endoscopy;  Laterality: N/A;  . I&D EXTREMITY Left 03/31/2014   Procedure: IRRIGATION AND DEBRIDEMENT EXTREMITY - LEFT ABOVE KNEE AMPUTATION;  Surgeon: Elam Dutch, MD;  Location: [place];  Service: Vascular;  Laterality: Left;  . PARATHYROIDECTOMY  2011  . SP AV DIALYSIS SHUNT ACCESS EXISTING *L*  2003   Family History  Family History  Problem Relation Age of Onset  . Cancer Sister     breast  . Cancer Brother     lung  . Anesthesia problems Neg Hx   . Hypotension Neg Hx   . Malignant hyperthermia Neg Hx   . Pseudochol deficiency Neg Hx    Social History  reports that he quit smoking about 39 years ago. His smoking use included Cigarettes. He has a 1.75 pack-year smoking history. He has never used smokeless tobacco. He reports that he does not drink alcohol or use drugs. Allergies  Allergies  Allergen Reactions  . Benazepril Hcl Hives, Itching and Rash    flareups on patient head   Home medications Prior to Admission medications   Medication Sig Start Date End Date Taking? Authorizing Provider  acetaminophen (TYLENOL) 325 MG tablet Take 650 mg by mouth every 6 (six) hours as needed for mild pain, fever or headache (DO NOT EXCEED 4 GM OF APAP IN 24 HOURS FORM ALL SOURCES).    Yes Historical Provider, MD  Amino Acids-Protein Hydrolys (FEEDING SUPPLEMENT, PRO-STAT SUGAR FREE 64,) LIQD Take 30 mLs by mouth 2 (two) times daily.   Yes Historical Provider, MD  atorvastatin (LIPITOR) 10 MG tablet Take 10 mg by mouth at bedtime.   Yes Historical Provider,  MD  B Complex-C (B-COMPLEX WITH VITAMIN C) tablet Take 1 tablet by mouth daily.   Yes Historical Provider, MD  docusate sodium (COLACE) 100 MG capsule Take 100 mg by mouth at bedtime.    Yes Historical Provider, MD  gabapentin (NEURONTIN) 100 MG capsule Take 100 mg by mouth at bedtime.    Yes Historical Provider, MD  HYDROcodone-acetaminophen (NORCO) 10-325 MG tablet Take 1 tablet by mouth every 4 (four) hours as needed (pain).    Yes Historical Provider, MD  midodrine (PROAMATINE) 5 MG tablet Take 5 mg by mouth See admin instructions. Take 1 tablet (  5 mg) by mouth 3 times daily on Sunday, Tuesday, Thursday, Saturday (7:30am, 2pm, 10pm); take 1 tablet (5 mg) by mouth 2 times daily on dialysis days - Monday, Wednesday, Friday (2pm and 10pm) 02/01/15  Yes Historical Provider, MD  ondansetron (ZOFRAN) 4 MG tablet Take 1 tablet (4 mg total) by mouth every 8 (eight) hours. Patient taking differently: Take 4 mg by mouth every 8 (eight) hours as needed for nausea or vomiting.  01/20/16  Yes Dinah C Ngetich, NP  promethazine (PHENERGAN) 25 MG suppository Place 25 mg rectally every 6 (six) hours as needed for nausea or vomiting.   Yes Historical Provider, MD  sevelamer carbonate (RENVELA) 800 MG tablet Take 800 mg by mouth See admin instructions. Take 1 tablet (800 mg) by mouth daily with lunch and dinner (12pm -5pm)   Yes Historical Provider, MD  sorbitol 70 % solution Take 30 mLs by mouth daily as needed (severe constipation).   Yes Historical Provider, MD   Liver Function Tests  Recent Labs Lab 01/21/16 1342  AST 30  ALT 12*  ALKPHOS 238*  BILITOT 1.9*  PROT 8.0  ALBUMIN 2.7*    Recent Labs Lab 01/21/16 1342  LIPASE 48   CBC  Recent Labs Lab 01/21/16 1342 01/21/16 1356  WBC 11.8*  --   HGB 10.6* 12.9*  HCT 33.3* 38.0*  MCV 83.7  --   PLT 197  --    Basic Metabolic Panel  Recent Labs Lab 01/21/16 1342 01/21/16 1356  NA 139 137  K 3.2* 3.1*  CL 85* 87*  CO2 32  --   GLUCOSE  105* 105*  BUN 39* 38*  CREATININE 8.69* 7.70*  CALCIUM 7.6*  --    Iron/TIBC/Ferritin/ %Sat No results found for: IRON, TIBC, FERRITIN, IRONPCTSAT  Vitals:   01/21/16 1845 01/21/16 1946 01/22/16 0432 01/22/16 0846  BP: 115/65 (!) 114/98 (!) 100/55 (!) 92/51  Pulse: 93 (!) 50 90 84  Resp: 20 16 16 17   Temp:  98.4 F (36.9 C) 98.2 F (36.8 C) 98.3 F (36.8 C)  TempSrc:  Oral Oral Oral  SpO2: 100% 100% 100% 98%  Weight:  59.3 kg (130 lb 11.7 oz)     Exam Gen no distress, tired and weak No rash, cyanosis or gangrene Sclera anicteric, throat clear  No jvd or bruits Chest clear bilat RRR no MRG Abd soft ntnd no mass or ascites +bs GU normal male MS no joint effusions or deformity Ext no LE edema / L AKA well healed Neuro is lethargic but OX3 and conversant    Dialysis: [place] [facility] MWF   4h  2/2.25 bath  64kg  Hep none  LFA AVG Hect 4 ug, Mircera 150 q 2 last 7/25  Assessment: 1   Fatigue/ anorexia - from panc Ca 2   ESRD missed HD x 1 3   DNR - pt requests no heroic measures 4   Hypotensions on midodrine 5   Vol is 4kg under dry wt 6   ID - no signs infection, will dc abx   Plan - HD today no fluid off. DNR.   [name] MD [employer] pager [phone]    cell [phone] 01/22/2016, 10:05 AM

## 2016-01-22 NOTE — Procedures (Signed)
  I was present at this dialysis session, have reviewed the session itself and made  appropriate changes [name] MD [place] pager [phone]    cell [phone] 01/22/2016, 2:46 PM

## 2016-01-22 NOTE — Progress Notes (Signed)
New Admission Note:  Arrival Method: Via stretcher with ED nurse Mental Orientation: Alert and oriented x4 Telemetry: box 28, CCMD notified Assessment: Completed Skin: dry and intact, scabs present on right leg, healed sacral wound present IV: Right forearm Pain: denies pain Tubes:n/a Safety Measures: Safety Fall Prevention Plan discussed. Call light has been placed within reach and bed alarm has been activated.  Admission: Completed [facility] [place] Orientation: Patient has been orientated to the room, unit and the staff. Family: none at bedside  Orders have been reviewed and implemented. Will continue to monitor the patient.   Leandro Reasoner BSN, RN  Phone Number: [phone] [facility]

## 2016-01-22 NOTE — Plan of Care (Signed)
Problem: Health Behavior/Discharge Planning: Goal: Ability to manage health-related needs will improve Outcome: Progressing Received call from Christean Leaf, nursing supervisor at [facility], calling to check on patient status. Advised of anticipated hemodialysis today and monitoring patient status. Patient aware of phone call.

## 2016-01-22 NOTE — Plan of Care (Signed)
Problem: Nutrition: Goal: Adequate nutrition will be maintained Outcome: Progressing Tolerating clear liquid diet this morning  Problem: Bowel/Gastric: Goal: Will not experience complications related to bowel motility Outcome: Progressing Enteric precautions implemented. Stool sample walked to lab by NT.

## 2016-01-22 NOTE — Progress Notes (Signed)
[facility] PROGRESS NOTE  Sean [name] [id number] DOB: [birth year] DOA: 01/21/2016 PCP: [name], MD  Interim summary and history of present illness 80 y.o. 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 Mike Craze, recommended hospice. Patient is not a candidate for surgery, and does not want chemotherapy. Patient undergoes dialysis Monday Wednesday Friday, and he missed dialysis on Wednesday and Friday. Patient reported being too nauseous and with vomiting/diarrhea in order to obtain his dialysis. Patient is no longer vomiting, still nauseated and with loose stools. Feeling weak and tired.  Assessment/Plan: 1-SIRS: Due to C. diff colitis -Patient nontoxic in appearance (chronically with hypotension requiring midodrine; and with lactic acid most likely from mild dehydration and lack of hemodialysis prior to admission). -No fever, no shortness of breath and normal WBCs. -Patient was having diarrhea and his stool culture demonstrated positive C. Diff (antigen and toxin) -Receive very gentle fluid resuscitation while in the ED and has now been started on oral vancomycin -Patient will have hemodialysis today.  2-history of pancreatic cancer: -Not a candidate for resection and not interested in pursuing chemotherapy or any further invasive/heroic treatment -CODE STATUS has been changed after discussing with patient to DO NOT RESUSCITATE and the plan is for him to discharge back to nursing home with palliative care following in as an outpatient. -Continue as needed analgesics  3-end-stage renal disease: -Renal service is on board and will follow recommendations for hemodialysis -Patient will like to continue hemodialysis treatment until he is symptomatically or physically unable to attend.  4-hypokalemia: -To be repleted cutting hemodialysis  session.  5-chronic hypotension -will continue use of midodrine   6-anemia of chronic disease: Secondary to renal failure -Iron and Epogen infusions to be determined by renal service -hemoglobin is stable  7-HLD:  -will continue statins for now  8-hyperparathyroidism: -will continue renvela   9-elevated troponin -Current without any chest pain and no shortness of breath -Most likely due to chronic renal failure -We'll discontinue telemetry.  Code Status: DNR Family Communication: No family at the time Disposition Plan: Remains inpatient, we'll initiate treatment for C. difficile colitis with oral vancomycin, discontinue all other antibiotics. Dialysis as per renal service recommendations. Patient currently DO NOT RESUSCITATE and no looking for invasive/head a week treatment. Anticipate discharge back to nursing home with palliative care follow-up in the next 48 hours or so.  Consultants:  Renal service  Procedures:  See below for x-ray reports  Inpatient hemodialysis  Antibiotics:  Oral vancomycin (01/22/16)>>> plan is to treat for 14 days total  HPI/Subjective: Afebrile, denies chest pain, no significant complaints of breathing or respiratory distress. Patient is still with intermittent crampy abdominal discomfort and having positive loose stools. Endorses some nausea but no vomiting.  Objective: Vitals:   01/22/16 0432 01/22/16 0846  BP: (!) 100/55 (!) 92/51  Pulse: 90 84  Resp: 16 17  Temp: 98.2 F (36.8 C) 98.3 F (36.8 C)    Intake/Output Summary (Last 24 hours) at 01/22/16 1238 Last data filed at 01/22/16 1237  Gross per 24 hour  Intake              465 ml  Output                0 ml  Net              465 ml   Filed Weights   01/21/16 1946  Weight: 59.3 kg (130 lb 11.7 oz)    Exam:   General:  Afebrile, no chest pain, reports no coughing, able to speak in full sentences and not signs of respiratory distress.  Cardiovascular: Regular rate and  rhythm, no rubs, no gallops, no JVD  Respiratory: Good air movement bilaterally, scattered rhonchi, no wheezing. Patient no using accessory muscles and with great oxygen saturation on room air.  Abdomen: Soft, nontender, positive bowel sounds; patient reports a slight tenderness in his mid abdomen with deep palpation.  Musculoskeletal: Left AKA, no cyanosis or edema appreciated on his right lower extremity; well-healed stump; left forearm AVG  Data Reviewed: Basic Metabolic Panel:  Recent Labs Lab 01/21/16 1342 01/21/16 1356  NA 139 137  K 3.2* 3.1*  CL 85* 87*  CO2 32  --   GLUCOSE 105* 105*  BUN 39* 38*  CREATININE 8.69* 7.70*  CALCIUM 7.6*  --    Liver Function Tests:  Recent Labs Lab 01/21/16 1342  AST 30  ALT 12*  ALKPHOS 238*  BILITOT 1.9*  PROT 8.0  ALBUMIN 2.7*    Recent Labs Lab 01/21/16 1342  LIPASE 48   CBC:  Recent Labs Lab 01/21/16 1342 01/21/16 1356  WBC 11.8*  --   HGB 10.6* 12.9*  HCT 33.3* 38.0*  MCV 83.7  --   PLT 197  --    Cardiac Enzymes:  Recent Labs Lab 01/21/16 2038  TROPONINI 0.08*   CBG:  Recent Labs Lab 01/21/16 1320 01/21/16 1955  GLUCAP 97 114*    Recent Results (from the past 240 hour(s))  MRSA PCR Screening     Status: None   Collection Time: 01/21/16 10:29 PM  Result Value Ref Range Status   MRSA by PCR NEGATIVE NEGATIVE Final    Comment:        The GeneXpert MRSA Assay (FDA approved for NASAL specimens only), is one component of a comprehensive MRSA colonization surveillance program. It is not intended to diagnose MRSA infection nor to guide or monitor treatment for MRSA infections.   C difficile quick scan w PCR reflex     Status: Abnormal   Collection Time: 01/22/16  9:14 AM  Result Value Ref Range Status   C Diff antigen POSITIVE (A) NEGATIVE Final   C Diff toxin POSITIVE (A) NEGATIVE Final   C Diff interpretation Toxin producing C. difficile detected.  Final    Comment: CRITICAL RESULT  CALLED TO, READ BACK BY AND VERIFIED WITH: W.[facility] RN AT [id number] 01/22/16 BY A.DAVIS      Studies: Dg Chest 2 View  Result Date: 01/21/2016 CLINICAL DATA:  Pt has been having chest pains and SOB.Hx of CHF, DM, HTN, renal insufficiency, peripheral neuropathy, dialysis patient EXAM: CHEST  2 VIEW COMPARISON:  11/22/2015 FINDINGS: Cardiac silhouette is mildly enlarged. The aorta is uncoiled. No mediastinal or hilar masses or evidence of adenopathy. Elevation of left hemidiaphragm. There is atelectasis at the left lung base and lateral to the left hilum. No convincing pneumonia. No evidence of pulmonary edema. No pleural effusion or pneumothorax. Bony thorax is demineralized but grossly intact. IMPRESSION: No acute cardiopulmonary disease. Electronically Signed   By: Lajean Manes M.D.   On: 01/21/2016 14:54   Ct Abdomen Pelvis W Contrast  Result Date: 01/21/2016 CLINICAL DATA:  Patient has been vomiting for about a week, and generalize weakness. He has been on dialysis for 15 years EXAM: CT ABDOMEN AND PELVIS WITH CONTRAST TECHNIQUE: Multidetector CT imaging of the abdomen and  pelvis was performed using the standard protocol following bolus administration of intravenous contrast. CONTRAST:  96mL ISOVUE-300 IOPAMIDOL (ISOVUE-300) INJECTION 61% COMPARISON:  11/22/2015 FINDINGS: Lung bases: Heart is mildly enlarged. There are coronary artery calcifications. Multiple calcified nodes are noted along the hila. Patchy areas of opacity are noted in the lungs. There is a 2.6 x 2.5 cm focal opacity in the left lower lobe. There is a 7 mm focal opacity in the posterior left lower lobe, image 2, series 3. Patchy areas of focal opacity noted in the right lower lobe. There are minimal pleural effusions. Pancreas: Ill-defined mass extends throughout most of the pancreatic head, neck and body and uncinate process, surrounding the superior mesenteric artery and celiac artery and branch vessels. Mass abuts and may invade the  anterior aspect of the aorta. Mass abuts the posterior margin of the stomach. It measures 4.4 cm in thickness at this level, but also extends along the gastrohepatic ligament to contact the pylorus and first and second portions of the duodenum. There are adjacent prominent to mildly enlarged lymph nodes along the gastrohepatic ligament and peripancreatic and periceliac chains. The tail the pancreas is atrophic with cystic dilation of the duct. 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 The stomach is significantly distended, which may be due to a gastroparesis or partial mechanical obstruction, due to the pancreatic mass. The ligament of Treitz region of the bowel is in close proximity to the mass and may be involved. Hepatobiliary: No liver mass or focal lesion. Gallbladder is distended. Small amount of sludge is evident dependently. No evidence acute cholecystitis. No bile duct dilation. Spleen: Mildly enlarged measuring 13.5 cm. No mass or focal lesion. Adrenal glands:  No masses. Kidneys, ureters, bladder: Marked renal atrophy with numerous bilateral renal cysts consistent with acquired renal cystic disease. No hydronephrosis. Bladder is decompressed. Vascular: Extensive arterial vascular calcifications noted along the aorta and throughout all branch vessels. Ascites: Trace amount of ascites seen adjacent to the liver and spleen. Prostate gland:  Enlarged and heterogeneous. Gastrointestinal: Distention of the distal esophagus with mild wall thickening. No evidence of small bowel or colonic obstruction. No wall thickening or inflammatory changes. There are prominent vessels throughout the mesenteric. Abdominal wall:  Midline scarring.  Diffuse subcutaneous edema. Musculoskeletal: Findings of renal osteodystrophy. Numerous Schmorl's nodes throughout the visualized spine. Large cysts along the acetabula. IMPRESSION: 1. As noted on the  prior CT, an ill-defined mass is centered on the pancreas involving the head, neck, body and uncinate process. Mass surrounds the celiac axis and superior mesenteric vessels, abuts and possibly invades the anterior aspect of the abdominal aorta and abuts the posterior margin of the stomach. It extends to abut the first and second portions of the duodenum. 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 This is highly suspicious for infiltrative pancreatic carcinoma. 2. Stomach is distended. This suggests either partial functional obstruction of the stomach or possibly mechanical obstruction in the region of the ligament of Treitz. 3. Trace amount of ascites. No definitive peritoneal carcinomatosis, although this is not excluded. 4. Lung base opacities that may be due to atelectasis or infection or a combination. Neoplastic/ metastatic disease is possible. Small pleural effusions. 5. Multiple chronic findings including end-stage renal disease and bony changes of renal osteodystrophy. Electronically Signed   By: Lajean Manes M.D.   On: 01/21/2016 15:48    Scheduled Meds: . atorvastatin  10 mg Oral QHS  .  feeding supplement  1 Container Oral TID BM  . gabapentin  100 mg Oral QHS  . heparin  5,000 Units Subcutaneous Q8H  . midodrine  5 mg Oral 3 times per day on Sun Tue Thu Sat   And  . [START ON 01/24/2016] midodrine  5 mg Oral 2 times per day on Mon Wed Fri  . sevelamer carbonate  800 mg Oral BID  . vancomycin  125 mg Oral QID   Continuous Infusions: . sodium chloride      Time spent: 30 minutes   Barton Dubois  [facility] Pager [phone]. If 7PM-7AM, please contact night-coverage at [email or web address], password [facility] 01/22/2016, 12:38 PM  LOS: 1 day

## 2016-01-22 NOTE — Progress Notes (Signed)
Initial Nutrition Assessment  DOCUMENTATION CODES:  Severe malnutrition in context of chronic illness  INTERVENTION:  Glucerna Shake po BID, each supplement provides 220 kcal and 10 grams of protein  Magic cup BID, each supplement provides 290 kcal and 9 grams of protein  Recommend palliative consult as his uncontrolled pain is largely what is making PO intake difficult  NUTRITION DIAGNOSIS:  Inadequate oral intake related to nausea, vomiting, poor appetite, cancer and cancer related treatments, chronic illness as evidenced by percent weight loss, per patient/family report.  GOAL:  Patient will maintain strength as long as possible and his nutritonal desires will be met to best of ability  MONITOR:  PO intake, Supplement acceptance, Diet advancement, Labs, GOC  REASON FOR ASSESSMENT:  Malnutrition Screening Tool    ASSESSMENT:  80 y/o male PMHx ESRD on HD, CAD, CHF, dementia, depression, anxiety, htn, hld, GERD, dm2, cva, MI and recently diagnosed with pancreatic cancer which he has declined any treatment for. Presntes with n/v, fatigue, poor appetite. Admitted with sepsis.   Pt is seen undergoing HD today.   Pt is extremely pleasant and delightful to talk to. He states he has been on dialysis for 15 years and followed the advice of an outpatient dietitian at the HD facility. He says he had a great QOL these past 15 years.   RD addressed that the main nutrition goal was to give him foods that would not cause unpleasant side effects (n/v/c/d), that he wants and ones that would keep him strong for as long as possible.   Recently, pt has been having n/v and poor appetite. This appears to have caused him to lose > 10% bw in 3 months. He has found little food that he has been able to tolerate. The only thing he could think of that he tolerated fairly well was glucerna. He said he was given a Magic cup that he tolerated well. He could not think of any food that he "wanted"  Though  unclear what exactly is cause of his n/v or pain. These symptoms must be better managed if he is to have improved intake.   A couple times during conversation, pt qould suddenly grimace in pain that he noted to be coming from his lower abdomen/pancreas. This pain radiates up his sternum. He states the pain has caused him to decrease his po intake and desires medication. He says he was supposed to meet with palliative care, but "we keep missing each other".   Spent a lot of time supportively listening. He talks a lot about how his QOL has decreased and the unpleasant circumstances at his nursing facility. Despite, the poor QOL there, he is very eager to get back there, "the damage is already one". He says she "just wants to be comfortable for a while" He does not like inpatient HD or the hospital and asked when he could leave. He asks RD if he could tell primary MD he wants to leave.   Recent Labs Lab 01/21/16 1342 01/21/16 1356 01/22/16 1341  NA 139 137 133*  K 3.2* 3.1* 3.5  CL 85* 87* 86*  CO2 32  --  27  BUN 39* 38* 49*  CREATININE 8.69* 7.70* 9.18*  CALCIUM 7.6*  --  7.7*  GLUCOSE 105* 105* 180*    Diet Order:  Diet full liquid Room service appropriate? Yes; Fluid consistency: Thin  Skin:  Reviewed, no issues  Last BM:  8/12  Height:  Ht Readings from Last 1 Encounters:  01/21/16 5' 5"  (1.651 m)   Weight:  Wt Readings from Last 1 Encounters:  01/22/16 132 lb 4.4 oz (60 kg)   Wt Readings from Last 10 Encounters:  01/22/16 132 lb 4.4 oz (60 kg)  01/21/16 149 lb 12.8 oz (67.9 kg)  01/20/16 149 lb 12.8 oz (67.9 kg)  01/19/16 149 lb 12.8 oz (67.9 kg)  01/14/16 144 lb 4.8 oz (65.5 kg)  01/06/16 145 lb (65.8 kg)  12/23/15 149 lb (67.6 kg)  12/10/15 149 lb (67.6 kg)  12/01/15 149 lb (67.6 kg)  11/29/15 159 lb 2.8 oz (72.2 kg)   Ideal Body Weight:  61.82 kg  BMI:  Body mass index is 22.01 kg/m.  Estimated Nutritional Needs:  Kcal:  N/A- palliative is goal Protein:   N/A- palliative is goal Fluid:  N/A- palliative is goal  EDUCATION NEEDS:  No education needs identified at this time  Burtis Junes RD, LDN, [facility] Pager: [phone] 01/22/2016 5:17 PM

## 2016-01-23 DIAGNOSIS — A047 Enterocolitis due to Clostridium difficile: Principal | ICD-10-CM

## 2016-01-23 DIAGNOSIS — E43 Unspecified severe protein-calorie malnutrition: Secondary | ICD-10-CM | POA: Insufficient documentation

## 2016-01-23 MED ORDER — OXYCODONE HCL 5 MG PO TABS
5.0000 mg | ORAL_TABLET | Freq: Three times a day (TID) | ORAL | Status: DC | PRN
  Administered 2016-01-23: 5 mg via ORAL
  Filled 2016-01-23: qty 1

## 2016-01-23 MED ORDER — NEPRO/CARBSTEADY PO LIQD
237.0000 mL | Freq: Three times a day (TID) | ORAL | Status: DC
  Administered 2016-01-24 – 2016-01-26 (×4): 237 mL via ORAL

## 2016-01-23 NOTE — Progress Notes (Addendum)
[facility] PROGRESS NOTE  Sean [name] [id number] DOB: 07-Apr-1936 DOA: 01/21/2016 PCP: [name], MD  Interim summary and history of present illness 80 y.o. 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 Mike Craze, recommended hospice. Patient is not a candidate for surgery, and does not want chemotherapy. Patient undergoes dialysis Monday Wednesday Friday, and he missed dialysis on Wednesday and Friday. Patient reported being too nauseous and with vomiting/diarrhea in order to obtain his dialysis. Patient is no longer vomiting, still nauseated and with loose stools. Feeling weak and tired.  Assessment/Plan: 1-SIRS: Due to C. diff colitis -Patient nontoxic in appearance (chronically with hypotension requiring midodrine; and with lactic acid most likely from mild dehydration and lack of hemodialysis prior to admission). -No fever, no shortness of breath and normal WBCs. -Patient was having diarrhea and his stool culture demonstrated positive C. Diff (antigen and toxin) -Receive very gentle fluid resuscitation while in the ED and has now been started on oral vancomycin -will advance diet as tolerated (renal diet is the goal) -continue PRN antiemetics.  2-history of pancreatic cancer: -Not a candidate for resection and not interested in pursuing chemotherapy or any further invasive/heroic treatment -CODE STATUS has been changed after discussing with patient to DO NOT RESUSCITATE and the plan is for him to discharge back to nursing home with palliative care following in as an outpatient. -Continue as needed analgesics (will use PRN oxycodone)  3-end-stage renal disease: -Renal service is on board and will follow recommendations for hemodialysis -Patient will like to continue hemodialysis treatment until he is symptomatically or physically unable to  attend.  4-hypokalemia: -To be repleted cutting hemodialysis session.  5-chronic hypotension -will continue use of midodrine   6-anemia of chronic disease: Secondary to renal failure -Iron and Epogen infusions to be determined by renal service -hemoglobin is stable  7-HLD:  -will continue statins for now  8-hyperparathyroidism: -will continue renvela   9-elevated troponin -Current without any chest pain and no shortness of breath -Most likely due to chronic renal failure -telemetry discontinued .  10- severe protein calorie malnutrition  -will add nepro TID  Code Status: DNR Family Communication: No family at the time Disposition Plan: Remains inpatient, we'll initiate treatment for C. difficile colitis with oral vancomycin, discontinue all other antibiotics. Dialysis as per renal service recommendations. Patient currently DO NOT RESUSCITATE and no looking for invasive/head a week treatment. Anticipate discharge back to nursing home with palliative care follow-up in the next 48 hours or so.  Consultants:  Renal service  Procedures:  See below for x-ray reports  Inpatient hemodialysis  Antibiotics:  Oral vancomycin (01/22/16)>>> plan is to treat for 14 days total  HPI/Subjective: Afebrile, denies chest pain, no SOB. Reports ome intermittent mild abd pain and denies any further nausea/vomiting  Objective: Vitals:   01/23/16 0711 01/23/16 1708  BP: 112/62 110/81  Pulse: 96 (!) 102  Resp: 16 16  Temp: 98.6 F (37 C) 99.8 F (37.7 C)    Intake/Output Summary (Last 24 hours) at 01/23/16 1751 Last data filed at 01/23/16 1709  Gross per 24 hour  Intake              942 ml  Output                1 ml  Net              941 ml  Filed Weights   01/22/16 1320 01/22/16 1706 01/22/16 2215  Weight: 60 kg (132 lb 4.4 oz) 60 kg (132 lb 4.4 oz) 60.9 kg (134 lb 3.2 oz)    Exam:   General:  Afebrile, no chest pain and no SOB. Patient with semi-form stools, no  further nausea or vomiting and just complaining of mild mid abd pain.   Cardiovascular: Regular rate and rhythm, no rubs, no gallops, no JVD  Respiratory: Good air movement bilaterally, scattered rhonchi, no wheezing. Patient no using accessory muscles and with great oxygen saturation on room air.  Abdomen: Soft, mild tenderness with palpation in mid abd; positive bowel sounds; no distension   Musculoskeletal: Left AKA, no cyanosis or edema appreciated on his right lower extremity; well-healed stump; left forearm AVG  Data Reviewed: Basic Metabolic Panel:  Recent Labs Lab 01/21/16 1342 01/21/16 1356 01/22/16 1341 01/22/16 1751  NA 139 137 133*  --   K 3.2* 3.1* 3.5  --   CL 85* 87* 86*  --   CO2 32  --  27  --   GLUCOSE 105* 105* 180*  --   BUN 39* 38* 49*  --   CREATININE 8.69* 7.70* 9.18*  --   CALCIUM 7.6*  --  7.7*  --   PHOS  --   --   --  5.4*   Liver Function Tests:  Recent Labs Lab 01/21/16 1342 01/22/16 1341  AST 30 22  ALT 12* 12*  ALKPHOS 238* 185*  BILITOT 1.9* 1.5*  PROT 8.0 7.2  ALBUMIN 2.7* 2.4*    Recent Labs Lab 01/21/16 1342  LIPASE 48   CBC:  Recent Labs Lab 01/21/16 1342 01/21/16 1356 01/22/16 1341  WBC 11.8*  --  10.1  HGB 10.6* 12.9* 9.2*  HCT 33.3* 38.0* 29.7*  MCV 83.7  --  81.8  PLT 197  --  234   Cardiac Enzymes:  Recent Labs Lab 01/21/16 2038 01/22/16 1341  TROPONINI 0.08* 0.07*   CBG:  Recent Labs Lab 01/21/16 1320 01/21/16 1955  GLUCAP 97 114*    Recent Results (from the past 240 hour(s))  Culture, blood (Routine X 2) w Reflex to ID Panel     Status: None (Preliminary result)   Collection Time: 01/21/16  6:40 PM  Result Value Ref Range Status   Specimen Description BLOOD RIGHT THUMB  Final   Special Requests IN PEDIATRIC BOTTLE 3CC  Final   Culture NO GROWTH 2 DAYS  Final   Report Status PENDING  Incomplete  Culture, blood (Routine X 2) w Reflex to ID Panel     Status: None (Preliminary result)    Collection Time: 01/21/16  6:46 PM  Result Value Ref Range Status   Specimen Description BLOOD RIGHT HAND  Final   Special Requests IN PEDIATRIC BOTTLE 3CC  Final   Culture NO GROWTH 2 DAYS  Final   Report Status PENDING  Incomplete  MRSA PCR Screening     Status: None   Collection Time: 01/21/16 10:29 PM  Result Value Ref Range Status   MRSA by PCR NEGATIVE NEGATIVE Final    Comment:        The GeneXpert MRSA Assay (FDA approved for NASAL specimens only), is one component of a comprehensive MRSA colonization surveillance program. It is not intended to diagnose MRSA infection nor to guide or monitor treatment for MRSA infections.   C difficile quick scan w PCR reflex     Status: Abnormal   Collection Time:  01/22/16  9:14 AM  Result Value Ref Range Status   C Diff antigen POSITIVE (A) NEGATIVE Final   C Diff toxin POSITIVE (A) NEGATIVE Final   C Diff interpretation Toxin producing C. difficile detected.  Final    Comment: CRITICAL RESULT CALLED TO, READ BACK BY AND VERIFIED WITH: W.[facility] RN AT [id number] 01/22/16 BY A.DAVIS      Studies: No results found.  Scheduled Meds: . atorvastatin  10 mg Oral QHS  . feeding supplement (GLUCERNA SHAKE)  237 mL Oral BID BM  . gabapentin  100 mg Oral QHS  . heparin  5,000 Units Subcutaneous Q8H  . midodrine  5 mg Oral 3 times per day on Sun Tue Thu Sat   And  . [START ON 01/24/2016] midodrine  5 mg Oral 2 times per day on Mon Wed Fri  . sevelamer carbonate  800 mg Oral BID  . vancomycin  125 mg Oral QID   Continuous Infusions: . sodium chloride      Time spent: 30 minutes   Barton Dubois  [facility] Pager [phone]. If 7PM-7AM, please contact night-coverage at [email or web address], password [facility] 01/23/2016, 5:50 PM  LOS: 2 days

## 2016-01-23 NOTE — Consult Note (Signed)
Renal Service Consult Note [place] 01/22/2016 Sol Blazing Requesting Physician:  Dr Dyann Kief  Reason for Consult:  Nausea/ vomiting/ missed HD3 HPI: The patient is a 80 y.o. year-old with hx of DM, HTN, L BKA (calciphylaxis) and ESRD. Also hx ischemic colitis and Cdif. Most recently was found to have panc mass incidental on CT abd done for colitis.  Was seen by Dr Benay Spice and given option of pall chemo but pt declined and wants to continue HD for now but says he will stop dialysis when he isn't strong enough to take it anymore.   Presenting now with nausea, vomiting and anorexia w fatigue.  No fevers. Some loose stools.  No cp or SOB.  Missed HD which is unusual for him.   Discussed code status, pt says he doesn't want heroic measures / life support , etc in event of cardiac/ resp arrest.  Abigail Butts RN present for discussion, will write DNR.     Prior admits: 2015 - chest pain, calciphylaxis LLE, HTN, esrd; lower EF so cards recommended cath but pt declined 2015 - calciphylaxis pain, admitted for pain control 2015 - septic / acute chole sp perc drain to GB, IV abx, plan GB removal later date, po cip/ flag at dc 2015 - L AKA 2015 - ischemic colitis, PSBO, DM, esrd 2017 - May > gallstone pancreatitis/ PSBO/ ileus , seen by gen surg but pt decline surg for now, dc'd 2017 - June > Cdif colitis w sepsis, pancreatic mass (new) on CT, poss involvement SMA/ celiac art, pt declined further diagnostic testing stating he is 80 yo and doesn't want to pursue it   ROS  denies CP  no joint pain   no HA  no blurry vision  no rash  no diarrhea  no nausea/ vomiting  no dysuria  no difficulty voiding  no change in urine color    Past Medical History      Past Medical History:  Diagnosis Date  . Arthritis   . CAD (coronary artery disease)    a. s/p NSTEMI and failed RCA PCI in 2011. b. Nuc 12/2013: nonischemic, prior  inferior MI.  . Calciphylaxis    a. Multiple admissions for nonhealing L ulcer/sepsis, s/p L AKA 02/02/2014.  Marland Kitchen CHF (congestive heart failure) ([place])   . Cholelithiasis 01/2014   acute cholecystitis managed with perc drain due to high surgical risk.   . Chronic anemia   . Dementia   . Depression with anxiety   . Dialysis patient [facility])    M, W, F; [place] ESRD (end stage renal disease) on dialysis ([place])   . GERD (gastroesophageal reflux disease)   . Heart attack ([place]) 2011; 1990's  . History of left bundle branch block   . Hx of above knee amputation ([place])    left  . Hyperlipidemia   . Hyperparathyroidism   . Hypertension   . Hypotension    renovascular  . LBBB (left bundle branch block)   . Migraine    none recent  . Pancreatic mass   . Peripheral neuropathy ([place])   . Renal insufficiency   . Stroke [facility])    several, last cva 2015 or 2014  . Type II diabetes mellitus ([place])    not taking any medications.   Past Surgical History       Past Surgical History:  Procedure Laterality Date  . AMPUTATION Left 02/02/2014   Procedure: AMPUTATION  ABOVE KNEE-LEFT;  Surgeon: Elam Dutch, MD;  Location: [place];  Service: Vascular;  Laterality: Left;  . APPLICATION OF WOUND VAC Left 03/31/2014   Procedure: APPLICATION OF WOUND VAC - LEFT AKA;  Surgeon: Elam Dutch, MD;  Location: [place];  Service: Vascular;  Laterality: Left;  . CAPD INSERTION  09/12/2011   Procedure: LAPAROSCOPIC INSERTION CONTINUOUS AMBULATORY PERITONEAL DIALYSIS  (CAPD) CATHETER;  Surgeon: Edward Jolly, MD;  Location: [place];  Service: General;  Laterality: N/A;  . CAPD REMOVAL  03/12/2012   Procedure: CONTINUOUS AMBULATORY PERITONEAL DIALYSIS  (CAPD) CATHETER REMOVAL;  Surgeon: Edward Jolly, MD;  Location: [place];  Service: General;  Laterality: N/A;  . COLON SURGERY  2011   colon resection   . COLONOSCOPY WITH PROPOFOL N/A 07/14/2014   Procedure:  COLONOSCOPY WITH PROPOFOL;  Surgeon: Jerene Bears, MD;  Location: [facility] ENDOSCOPY;  Service: Gastroenterology;  Laterality: N/A;  . DG AV DIALYSIS SHUNT INTRO NDL*R* OR     "not working" (09/19/11)  . ESOPHAGOGASTRODUODENOSCOPY Left 11/28/2015   Procedure: ESOPHAGOGASTRODUODENOSCOPY (EGD);  Surgeon: Juanita Craver, MD;  Location[facility] ENDOSCOPY;  Service: Endoscopy;  Laterality: Left;  . EUS N/A 01/06/2016   Procedure: UPPER ENDOSCOPIC ULTRASOUND (EUS) LINEAR;  Surgeon: Milus Banister, MD;  Location: [facility] ENDOSCOPY;  Service: Endoscopy;  Laterality: N/A;  . I&D EXTREMITY Left 03/31/2014   Procedure: IRRIGATION AND DEBRIDEMENT EXTREMITY - LEFT ABOVE KNEE AMPUTATION;  Surgeon: Elam Dutch, MD;  Location: [place];  Service: Vascular;  Laterality: Left;  . PARATHYROIDECTOMY  2011  . SP AV DIALYSIS SHUNT ACCESS EXISTING *L*  2003   Family History        Family History  Problem Relation Age of Onset  . Cancer Sister     breast  . Cancer Brother     lung  . Anesthesia problems Neg Hx   . Hypotension Neg Hx   . Malignant hyperthermia Neg Hx   . Pseudochol deficiency Neg Hx    Social History  reports that he quit smoking about 39 years ago. His smoking use included Cigarettes. He has a 1.75 pack-year smoking history. He has never used smokeless tobacco. He reports that he does not drink alcohol or use drugs. Allergies       Allergies  Allergen Reactions  . Benazepril Hcl Hives, Itching and Rash    flareups on patient head   Home medications        Prior to Admission medications   Medication Sig Start Date End Date Taking? Authorizing Provider  acetaminophen (TYLENOL) 325 MG tablet Take 650 mg by mouth every 6 (six) hours as needed for mild pain, fever or headache (DO NOT EXCEED 4 GM OF APAP IN 24 HOURS FORM ALL SOURCES).    Yes Historical Provider, MD  Amino Acids-Protein Hydrolys (FEEDING SUPPLEMENT, PRO-STAT SUGAR FREE 64,) LIQD Take 30 mLs by mouth 2 (two) times daily.    Yes Historical Provider, MD  atorvastatin (LIPITOR) 10 MG tablet Take 10 mg by mouth at bedtime.   Yes Historical Provider, MD  B Complex-C (B-COMPLEX WITH VITAMIN C) tablet Take 1 tablet by mouth daily.   Yes Historical Provider, MD  docusate sodium (COLACE) 100 MG capsule Take 100 mg by mouth at bedtime.    Yes Historical Provider, MD  gabapentin (NEURONTIN) 100 MG capsule Take 100 mg by mouth at bedtime.    Yes Historical Provider, MD  HYDROcodone-acetaminophen (NORCO) 10-325 MG tablet Take 1 tablet by mouth  every 4 (four) hours as needed (pain).    Yes Historical Provider, MD  midodrine (PROAMATINE) 5 MG tablet Take 5 mg by mouth See admin instructions. Take 1 tablet (5 mg) by mouth 3 times daily on Sunday, Tuesday, Thursday, Saturday (7:30am, 2pm, 10pm); take 1 tablet (5 mg) by mouth 2 times daily on dialysis days - Monday, Wednesday, Friday (2pm and 10pm) 02/01/15  Yes Historical Provider, MD  ondansetron (ZOFRAN) 4 MG tablet Take 1 tablet (4 mg total) by mouth every 8 (eight) hours. Patient taking differently: Take 4 mg by mouth every 8 (eight) hours as needed for nausea or vomiting.  01/20/16  Yes Dinah C Ngetich, NP  promethazine (PHENERGAN) 25 MG suppository Place 25 mg rectally every 6 (six) hours as needed for nausea or vomiting.   Yes Historical Provider, MD  sevelamer carbonate (RENVELA) 800 MG tablet Take 800 mg by mouth See admin instructions. Take 1 tablet (800 mg) by mouth daily with lunch and dinner (12pm -5pm)   Yes Historical Provider, MD  sorbitol 70 % solution Take 30 mLs by mouth daily as needed (severe constipation).   Yes Historical Provider, MD   Liver Function Tests  Last Labs    Recent Labs Lab 01/21/16 1342  AST 30  ALT 12*  ALKPHOS 238*  BILITOT 1.9*  PROT 8.0  ALBUMIN 2.7*      Last Labs    Recent Labs Lab 01/21/16 1342  LIPASE 48     CBC  Last Labs    Recent Labs Lab 01/21/16 1342 01/21/16 1356  WBC 11.8*  --    HGB 10.6* 12.9*  HCT 33.3* 38.0*  MCV 83.7  --   PLT 197  --      Basic Metabolic Panel  Last Labs    Recent Labs Lab 01/21/16 1342 01/21/16 1356  NA 139 137  K 3.2* 3.1*  CL 85* 87*  CO2 32  --   GLUCOSE 105* 105*  BUN 39* 38*  CREATININE 8.69* 7.70*  CALCIUM 7.6*  --      Iron/TIBC/Ferritin/ %Sat Labs (Brief)  No results found for: IRON, TIBC, FERRITIN, IRONPCTSAT          Vitals:   01/21/16 1845 01/21/16 1946 01/22/16 0432 01/22/16 0846  BP: 115/65 (!) 114/98 (!) 100/55 (!) 92/51  Pulse: 93 (!) 50 90 84  Resp: 20 16 16 17   Temp:  98.4 F (36.9 C) 98.2 F (36.8 C) 98.3 F (36.8 C)  TempSrc:  Oral Oral Oral  SpO2: 100% 100% 100% 98%  Weight:  59.3 kg (130 lb 11.7 oz)     Exam Gen no distress, tired and weak No rash, cyanosis or gangrene Sclera anicteric, throat clear  No jvd or bruits Chest clear bilat RRR no MRG Abd soft ntnd no mass or ascites +bs GU normal male MS no joint effusions or deformity Ext no LE edema / L AKA well healed Neuro is lethargic but OX3 and conversant    Dialysis: [place] MWF   4h  2/2.25 bath  64kg  Hep none  LFA AVG Hect 4 ug, Mircera 150 q 2 last 7/25  Assessment: 1   Fatigue/ anorexia - from panc Ca 2   ESRD missed HD x 1 3   DNR - pt requests no heroic measures 4   Hypotensions on midodrine 5   Vol is 4kg under dry wt 6   ID - no signs infection, will dc abx   Plan - HD MWF,  will follow. He feels that he is not sure if HD is helping him now or hurting him, told him it is up to him if he can do HD or not.    [name] MD [employer] pager [phone]    cell [phone] 01/22/2016, 10:05 AM

## 2016-01-24 LAB — BASIC METABOLIC PANEL
ANION GAP: 19 — AB (ref 5–15)
BUN: 34 mg/dL — ABNORMAL HIGH (ref 6–20)
CHLORIDE: 90 mmol/L — AB (ref 101–111)
CO2: 30 mmol/L (ref 22–32)
Calcium: 8.5 mg/dL — ABNORMAL LOW (ref 8.9–10.3)
Creatinine, Ser: 7.07 mg/dL — ABNORMAL HIGH (ref 0.61–1.24)
GFR calc non Af Amer: 7 mL/min — ABNORMAL LOW (ref >60)
GFR, EST AFRICAN AMERICAN: 8 mL/min — AB (ref >60)
Glucose, Bld: 124 mg/dL — ABNORMAL HIGH (ref 65–99)
POTASSIUM: 3.8 mmol/L (ref 3.5–5.1)
Sodium: 139 mmol/L (ref 135–145)

## 2016-01-24 MED ORDER — PROMETHAZINE HCL 25 MG/ML IJ SOLN
12.5000 mg | Freq: Four times a day (QID) | INTRAMUSCULAR | Status: DC | PRN
  Administered 2016-01-24: 12.5 mg via INTRAVENOUS
  Filled 2016-01-24: qty 1

## 2016-01-24 MED ORDER — WHITE PETROLATUM GEL
Status: AC
  Filled 2016-01-24: qty 1

## 2016-01-24 MED ORDER — DARBEPOETIN ALFA 150 MCG/0.3ML IJ SOSY
150.0000 ug | PREFILLED_SYRINGE | INTRAMUSCULAR | Status: DC
  Filled 2016-01-24: qty 0.3

## 2016-01-24 NOTE — Progress Notes (Signed)
[facility] PROGRESS NOTE  Sean [name] [id number] DOB: [birth year] DOA: 01/21/2016 PCP: [name], MD  Interim summary and history of present illness 80 y.o. 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 Mike Craze, recommended hospice. Patient is not a candidate for surgery, and does not want chemotherapy. Patient undergoes dialysis Monday Wednesday Friday, and he missed dialysis on Wednesday and Friday. Patient reported being too nauseous and with vomiting/diarrhea in order to obtain his dialysis. Patient is no longer vomiting, still nauseated and with loose stools. Feeling weak and tired.  Assessment/Plan: 1-SIRS: Due to C. diff colitis -Patient nontoxic in appearance (chronically with hypotension requiring midodrine; and with lactic acid most likely from mild dehydration and lack of hemodialysis prior to admission). -No fever, no shortness of breath and normal WBCs. -Patient was having diarrhea and his stool culture demonstrated positive C. Diff (antigen and toxin) -Receive very gentle fluid resuscitation while in the ED and has now been started on oral vancomycin -will advance diet as tolerated (renal diet is the goal) -continue PRN antiemetics (zofrana dn phenergan for refractory symptoms).  2-history of pancreatic cancer: -Not a candidate for resection and not interested in pursuing chemotherapy or any further invasive/heroic treatment -CODE STATUS has been changed after discussing with patient to DO NOT RESUSCITATE and the plan is for him to discharge back to nursing home with palliative care following in as an outpatient. -Continue as needed analgesics (will use PRN oxycodone) and IV dilaudid for severe pain  3-end-stage renal disease: -Renal service is on board and will follow recommendations for hemodialysis -Patient will like to continue  hemodialysis treatment until he is symptomatically or physically unable to attend. -has decline HD today 8/14 as he was not feeling good and was having abd pain, nausea and vomiting   4-hypokalemia: -To be repleted with his hemodialysis session.  5-chronic hypotension -will continue use of midodrine   6-anemia of chronic disease: Secondary to renal failure -Iron and Epogen infusions to be determined by renal service -hemoglobin is stable  7-HLD:  -will continue statins for now  8-hyperparathyroidism: -will continue renvela   9-elevated troponin -Currently without any chest pain and no shortness of breath -Most likely due to chronic renal failure -telemetry has been discontinued .  10- severe protein calorie malnutrition  -will continue nepro TID  Code Status: DNR Family Communication: No family at the time Disposition Plan: Remains inpatient, we'll initiate treatment for C. difficile colitis with oral vancomycin, discontinue all other antibiotics. Dialysis as per renal service recommendations. Patient currently DO NOT RESUSCITATE and no looking for invasive/head a week treatment. Anticipate discharge back to nursing home with palliative care follow-up in the next 48 hours or so.  Consultants:  Renal service  Procedures:  See below for x-ray reports  Inpatient hemodialysis  Antibiotics:  Oral vancomycin (01/22/16)>>> plan is to treat for 14 days total  HPI/Subjective: Afebrile, denies chest pain, no SOB. Not feeling good. Complaining of nausea, vomiting and abd pain.  Objective: Vitals:   01/24/16 0503 01/24/16 0952  BP: 130/73 120/75  Pulse: 98 98  Resp: 16 16  Temp: 98.8 F (37.1 C) 98.6 F (37 C)    Intake/Output Summary (Last 24 hours) at 01/24/16 1353 Last data filed at 01/24/16 0953  Gross per 24 hour  Intake              342 ml  Output  0 ml  Net              342 ml   Filed Weights   01/22/16 1706 01/22/16 2215 01/23/16 2233   Weight: 60 kg (132 lb 4.4 oz) 60.9 kg (134 lb 3.2 oz) 60.1 kg (132 lb 8 oz)    Exam:   General:  Afebrile, no chest pain and no SOB. Patient not feeling good today and with very eventful morning with nausea, vomiting and abd discomfort.   Cardiovascular: Regular rate and rhythm, no rubs, no gallops, no JVD  Respiratory: Good air movement bilaterally, scattered rhonchi, no wheezing. Patient no using accessory muscles and with great oxygen saturation on room air.  Abdomen: Soft, mild tenderness with palpation in mid abd; positive bowel sounds; no distension   Musculoskeletal: Left AKA, no cyanosis or edema appreciated on his right lower extremity; well-healed stump; left forearm AVG  Data Reviewed: Basic Metabolic Panel:  Recent Labs Lab 01/21/16 1342 01/21/16 1356 01/22/16 1341 01/22/16 1751 01/24/16 1239  NA 139 137 133*  --  139  K 3.2* 3.1* 3.5  --  3.8  CL 85* 87* 86*  --  90*  CO2 32  --  27  --  30  GLUCOSE 105* 105* 180*  --  124*  BUN 39* 38* 49*  --  34*  CREATININE 8.69* 7.70* 9.18*  --  7.07*  CALCIUM 7.6*  --  7.7*  --  8.5*  PHOS  --   --   --  5.4*  --    Liver Function Tests:  Recent Labs Lab 01/21/16 1342 01/22/16 1341  AST 30 22  ALT 12* 12*  ALKPHOS 238* 185*  BILITOT 1.9* 1.5*  PROT 8.0 7.2  ALBUMIN 2.7* 2.4*    Recent Labs Lab 01/21/16 1342  LIPASE 48   CBC:  Recent Labs Lab 01/21/16 1342 01/21/16 1356 01/22/16 1341  WBC 11.8*  --  10.1  HGB 10.6* 12.9* 9.2*  HCT 33.3* 38.0* 29.7*  MCV 83.7  --  81.8  PLT 197  --  234   Cardiac Enzymes:  Recent Labs Lab 01/21/16 2038 01/22/16 1341  TROPONINI 0.08* 0.07*   CBG:  Recent Labs Lab 01/21/16 1320 01/21/16 1955  GLUCAP 97 114*    Recent Results (from the past 240 hour(s))  Culture, blood (Routine X 2) w Reflex to ID Panel     Status: None (Preliminary result)   Collection Time: 01/21/16  6:40 PM  Result Value Ref Range Status   Specimen Description BLOOD RIGHT  THUMB  Final   Special Requests IN PEDIATRIC BOTTLE 3CC  Final   Culture NO GROWTH 2 DAYS  Final   Report Status PENDING  Incomplete  Culture, blood (Routine X 2) w Reflex to ID Panel     Status: None (Preliminary result)   Collection Time: 01/21/16  6:46 PM  Result Value Ref Range Status   Specimen Description BLOOD RIGHT HAND  Final   Special Requests IN PEDIATRIC BOTTLE 3CC  Final   Culture NO GROWTH 2 DAYS  Final   Report Status PENDING  Incomplete  MRSA PCR Screening     Status: None   Collection Time: 01/21/16 10:29 PM  Result Value Ref Range Status   MRSA by PCR NEGATIVE NEGATIVE Final    Comment:        The GeneXpert MRSA Assay (FDA approved for NASAL specimens only), is one component of a comprehensive MRSA colonization surveillance program. It is not  intended to diagnose MRSA infection nor to guide or monitor treatment for MRSA infections.   C difficile quick scan w PCR reflex     Status: Abnormal   Collection Time: 01/22/16  9:14 AM  Result Value Ref Range Status   C Diff antigen POSITIVE (A) NEGATIVE Final   C Diff toxin POSITIVE (A) NEGATIVE Final   C Diff interpretation Toxin producing C. difficile detected.  Final    Comment: CRITICAL RESULT CALLED TO, READ BACK BY AND VERIFIED WITH: W.[facility] RN AT [id number] 01/22/16 BY A.DAVIS      Studies: No results found.  Scheduled Meds: . atorvastatin  10 mg Oral QHS  . darbepoetin (ARANESP) injection - DIALYSIS  150 mcg Intravenous Q Mon-HD  . feeding supplement (NEPRO CARB STEADY)  237 mL Oral TID BM  . gabapentin  100 mg Oral QHS  . heparin  5,000 Units Subcutaneous Q8H  . midodrine  5 mg Oral 3 times per day on Sun Tue Thu Sat   And  . midodrine  5 mg Oral 2 times per day on Mon Wed Fri  . sevelamer carbonate  800 mg Oral BID  . vancomycin  125 mg Oral QID  . white petrolatum       Continuous Infusions: . sodium chloride      Time spent: 30 minutes   Barton Dubois  [facility] Pager [phone].  If 7PM-7AM, please contact night-coverage at [email or web address], password [facility] 01/24/2016, 1:53 PM  LOS: 2 days

## 2016-01-24 NOTE — Progress Notes (Signed)
Subjective:  HD postponed this am sec copious Vomiting  Pre pt , he plans to go later today / some Abd discomfort  continues   Objective Vital signs in last 24 hours: Vitals:   01/23/16 1708 01/23/16 2233 01/24/16 0503 01/24/16 0952  BP: 110/81 111/71 130/73 120/75  Pulse: (!) 102 95 98 98  Resp: 16 18 16 16   Temp: 99.8 F (37.7 C) 99.2 F (37.3 C) 98.8 F (37.1 C) 98.6 F (37 C)  TempSrc: Oral Oral Oral Oral  SpO2: 96% 94% 99% 96%  Weight:  60.1 kg (132 lb 8 oz)    Height:  5\' 5"  (1.651 m)     Weight change: 0.101 kg (3.6 oz)  Physical Exam: General: Alert OX3, NAD now , "my vomiting has stopped" Heart: RRR no mur, rub Lungs: CTA  bilat , nonlabored breathing  Abdomen: BS pos sl decr. Soft , tender throughout  No rebound , Non distended  Extremities: Left AKA/ no right pedal edema  Dialysis Access: LFA AVF pos bruit    OP [place]ialysis[place] MWF4h 2/2.25 bath 64kg Hep none LFA AVG Hect 4 ug, Mircera 150 q 2 last 7/25  Problem/Plan: 1 Fatigue/ anorexia - from panc Ca 2 ESRD missed HD x 1 3 DNR - pt requests no heroic measures 4 Hypotensions on midodrine 5 Vol is 4kg under dry wt 6 ID - no signs infection, will dc abx  1.  SIRS= with C. Diff Colitis- RX per admit team  Needed some iv fluids  On admit / PO vanco 2. ESRD - HD MWF (had missed HD sec to s ymp . N/V  Fatigue) on dw this am  He still wishes to continue with HD for Now  3. Panc. CA-  Was seen by Dr Benay Spice and given option of pall chemo but pt declined / He is DNR now  4. HTN/volume - on Midodrine  For Hypotension , no uf in hd today  Under edw (BED WTS) 5. Anemia - hgb 9.2  On esa  / give Aranesp 150 today on Hd  6. Secondary hyperparathyroidism - 5.4 phos on 12th on  Renvela binder 1 bid meals / hect on hd / corec ca 8.2  Ernest Haber, PA-C [facility] [phone] 01/24/2016,10:02 AM  LOS: 3 days   Labs: Basic Metabolic Panel:  Recent Labs Lab 01/21/16 1342  01/21/16 1356 01/22/16 1341 01/22/16 1751  NA 139 137 133*  --   K 3.2* 3.1* 3.5  --   CL 85* 87* 86*  --   CO2 32  --  27  --   GLUCOSE 105* 105* 180*  --   BUN 39* 38* 49*  --   CREATININE 8.69* 7.70* 9.18*  --   CALCIUM 7.6*  --  7.7*  --   PHOS  --   --   --  5.4*   Liver Function Tests:  Recent Labs Lab 01/21/16 1342 01/22/16 1341  AST 30 22  ALT 12* 12*  ALKPHOS 238* 185*  BILITOT 1.9* 1.5*  PROT 8.0 7.2  ALBUMIN 2.7* 2.4*    Recent Labs Lab 01/21/16 1342  LIPASE 48   No results for input(s): AMMONIA in the last 168 hours. CBC:  Recent Labs Lab 01/21/16 1342 01/21/16 1356 01/22/16 1341  WBC 11.8*  --  10.1  HGB 10.6* 12.9* 9.2*  HCT 33.3* 38.0* 29.7*  MCV 83.7  --  81.8  PLT 197  --  234   Cardiac Enzymes:  Recent Labs Lab 01/21/16 2038 01/22/16 1341  TROPONINI 0.08* 0.07*   CBG:  Recent Labs Lab 01/21/16 1320 01/21/16 1955  GLUCAP 97 114*    Studies/Results: No results found. Medications: . sodium chloride     . atorvastatin  10 mg Oral QHS  . feeding supplement (NEPRO CARB STEADY)  237 mL Oral TID BM  . gabapentin  100 mg Oral QHS  . heparin  5,000 Units Subcutaneous Q8H  . midodrine  5 mg Oral 3 times per day on Sun Tue Thu Sat   And  . midodrine  5 mg Oral 2 times per day on Mon Wed Fri  . sevelamer carbonate  800 mg Oral BID  . vancomycin  125 mg Oral QID  . white petrolatum

## 2016-01-25 MED ORDER — LIDOCAINE HCL (PF) 1 % IJ SOLN: 5.0000 mL | INTRAMUSCULAR | Status: DC | PRN

## 2016-01-25 MED ORDER — DARBEPOETIN ALFA 150 MCG/0.3ML IJ SOSY
150.0000 ug | PREFILLED_SYRINGE | INTRAMUSCULAR | Status: DC
  Administered 2016-01-26: 150 ug via INTRAVENOUS
  Filled 2016-01-25: qty 0.3

## 2016-01-25 MED ORDER — SODIUM CHLORIDE 0.9 % IV SOLN: 100.0000 mL | INTRAVENOUS | Status: DC | PRN

## 2016-01-25 MED ORDER — DOXERCALCIFEROL 4 MCG/2ML IV SOLN
4.0000 ug | INTRAVENOUS | Status: DC
  Administered 2016-01-26: 4 ug via INTRAVENOUS
  Filled 2016-01-25: qty 2

## 2016-01-25 MED ORDER — PENTAFLUOROPROP-TETRAFLUOROETH EX AERO: 1.0000 "application " | INHALATION_SPRAY | CUTANEOUS | Status: DC | PRN

## 2016-01-25 MED ORDER — LIDOCAINE-PRILOCAINE 2.5-2.5 % EX CREA: 1.0000 "application " | TOPICAL_CREAM | CUTANEOUS | Status: DC | PRN

## 2016-01-25 MED ORDER — ALTEPLASE 2 MG IJ SOLR: 2.0000 mg | Freq: Once | INTRAMUSCULAR | Status: DC | PRN

## 2016-01-25 MED ORDER — HEPARIN SODIUM (PORCINE) 1000 UNIT/ML DIALYSIS: 1000.0000 [IU] | INTRAMUSCULAR | Status: DC | PRN

## 2016-01-25 NOTE — Progress Notes (Signed)
Patient refused all medications for the shift, would not take nothing for pain or for nausea. Patient did not however have any emesis through out shift. Morning RN updated.

## 2016-01-25 NOTE — Progress Notes (Signed)
Pt refused all medications today. Pt did not have any emesis and sat in the chair for a few hours this afternoon. He said "I feel better when I don't take any medications".

## 2016-01-25 NOTE — Progress Notes (Signed)
[facility] PROGRESS NOTE  Sean [name] [id number] DOB: 22-Jul-1935 DOA: 01/21/2016 PCP: [name], MD  Interim summary and history of present illness 80 y.o. 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 Mike Craze, recommended hospice. Patient is not a candidate for surgery, and does not want chemotherapy. Patient undergoes dialysis Monday Wednesday Friday, and he missed dialysis on Wednesday and Friday. Patient reported being too nauseous and with vomiting/diarrhea in order to obtain his dialysis. Patient is no longer vomiting, still nauseated and with loose stools. Feeling weak and tired.  Assessment/Plan: 1-SIRS: Due to C. diff colitis -Patient nontoxic in appearance (chronically with hypotension requiring midodrine; and with lactic acid most likely from mild dehydration and lack of hemodialysis prior to admission). -No fever, no shortness of breath and normal WBCs. -Patient was having diarrhea and his stool culture demonstrated positive C. Diff (antigen and toxin) -Receive very gentle fluid resuscitation while in the ED and has now been started on oral vancomycin -will advance diet as tolerated (renal diet is the goal) -continue PRN antiemetics (zofrana dn phenergan for refractory symptoms). -feeling slightly better from this.   2-history of pancreatic cancer: -Not a candidate for resection and not interested in pursuing chemotherapy or any further invasive/heroic treatment -CODE STATUS has been changed after discussing with patient to DO NOT RESUSCITATE and the plan is for him to discharge back to nursing home with palliative care following in as an outpatient. -Continue as needed analgesics (will use PRN oxycodone) and IV dilaudid for severe pain; if patient will like to take them. -as mentioned below, will ask palliative care assistance with  delineation of GOC and future advance directives.  3-end-stage renal disease: -Renal service is on board and will follow recommendations for hemodialysis -Patient will like to continue hemodialysis treatment until he is symptomatically or physically unable to attend. -has decline HD 8/14 and again on 8/15. -after discussing with him, he is unsure of what to do and how to communicate with his family. He will like to focus on comfort, but feel kind of obligated to continue treatments. -will ask palliative care assistance in this situation.   4-hypokalemia: -To be repleted with his hemodialysis session.  5-chronic hypotension -will continue use of midodrine   6-anemia of chronic disease: Secondary to renal failure -Iron and Epogen infusions to be determined by renal service -hemoglobin is stable  7-HLD:  -will continue statins for now  8-hyperparathyroidism: -will continue renvela   9-elevated troponin -Currently without any chest pain and no shortness of breath -Most likely due to chronic renal failure -telemetry has been discontinued .  10- severe protein calorie malnutrition  -will continue nepro TID  Code Status: DNR Family Communication: No family at the time Disposition Plan: Remains inpatient, we'll initiate treatment for C. difficile colitis with oral vancomycin, discontinue all other antibiotics. Dialysis as per renal service recommendations. Patient currently DO NOT RESUSCITATE and no looking for invasive/head a week treatment. Anticipate discharge back to nursing home with palliative care follow-up in the next 48 hours or so.  Consultants:  Renal service  Palliative care   Procedures:  See below for x-ray reports  Inpatient hemodialysis  Antibiotics:  Oral vancomycin (01/22/16)>>> plan is to treat for 14 days total  HPI/Subjective: Afebrile, denies chest pain, no SOB. Feeling slightly better and denying diarrhea. Reports that he still has abd pain. Doesn't  want HD today and is  refusing medications.  Objective: Vitals:   01/25/16 0520 01/25/16 1013  BP: (!) 95/58 (!) 89/60  Pulse: 99 80  Resp: 18   Temp: 97.6 F (36.4 C)     Intake/Output Summary (Last 24 hours) at 01/25/16 1855 Last data filed at 01/25/16 1045  Gross per 24 hour  Intake              600 ml  Output                0 ml  Net              600 ml   Filed Weights   01/22/16 2215 01/23/16 2233 01/24/16 2115  Weight: 60.9 kg (134 lb 3.2 oz) 60.1 kg (132 lb 8 oz) 59.9 kg (132 lb 2.3 oz)    Exam:   General:  Afebrile, no chest pain and no SOB. Patient feeling slightly better today. deneis diarrhea. But continue having pain in his abdomen and has essentially refused medications all day.    Cardiovascular: Regular rate and rhythm, no rubs, no gallops, no JVD  Respiratory: Good air movement bilaterally, scattered rhonchi, no wheezing. Patient no using accessory muscles and with great oxygen saturation on room air.  Abdomen: Soft, mild tenderness with palpation in mid abd; positive bowel sounds; no distension   Musculoskeletal: Left AKA, no cyanosis or edema appreciated on his right lower extremity; well-healed stump; left forearm AVG  Data Reviewed: Basic Metabolic Panel:  Recent Labs Lab 01/21/16 1342 01/21/16 1356 01/22/16 1341 01/22/16 1751 01/24/16 1239  NA 139 137 133*  --  139  K 3.2* 3.1* 3.5  --  3.8  CL 85* 87* 86*  --  90*  CO2 32  --  27  --  30  GLUCOSE 105* 105* 180*  --  124*  BUN 39* 38* 49*  --  34*  CREATININE 8.69* 7.70* 9.18*  --  7.07*  CALCIUM 7.6*  --  7.7*  --  8.5*  PHOS  --   --   --  5.4*  --    Liver Function Tests:  Recent Labs Lab 01/21/16 1342 01/22/16 1341  AST 30 22  ALT 12* 12*  ALKPHOS 238* 185*  BILITOT 1.9* 1.5*  PROT 8.0 7.2  ALBUMIN 2.7* 2.4*    Recent Labs Lab 01/21/16 1342  LIPASE 48   CBC:  Recent Labs Lab 01/21/16 1342 01/21/16 1356 01/22/16 1341  WBC 11.8*  --  10.1  HGB 10.6* 12.9* 9.2*   HCT 33.3* 38.0* 29.7*  MCV 83.7  --  81.8  PLT 197  --  234   Cardiac Enzymes:  Recent Labs Lab 01/21/16 2038 01/22/16 1341  TROPONINI 0.08* 0.07*   CBG:  Recent Labs Lab 01/21/16 1320 01/21/16 1955  GLUCAP 97 114*    Recent Results (from the past 240 hour(s))  Culture, blood (Routine X 2) w Reflex to ID Panel     Status: None (Preliminary result)   Collection Time: 01/21/16  6:40 PM  Result Value Ref Range Status   Specimen Description BLOOD RIGHT THUMB  Final   Special Requests IN PEDIATRIC BOTTLE 3CC  Final   Culture NO GROWTH 4 DAYS  Final   Report Status PENDING  Incomplete  Culture, blood (Routine X 2) w Reflex to ID Panel     Status: None (Preliminary result)   Collection Time: 01/21/16  6:46 PM  Result Value Ref Range Status   Specimen Description BLOOD RIGHT HAND  Final   Special Requests IN PEDIATRIC BOTTLE 3CC  Final   Culture NO GROWTH 4 DAYS  Final   Report Status PENDING  Incomplete  MRSA PCR Screening     Status: None   Collection Time: 01/21/16 10:29 PM  Result Value Ref Range Status   MRSA by PCR NEGATIVE NEGATIVE Final    Comment:        The GeneXpert MRSA Assay (FDA approved for NASAL specimens only), is one component of a comprehensive MRSA colonization surveillance program. It is not intended to diagnose MRSA infection nor to guide or monitor treatment for MRSA infections.   C difficile quick scan w PCR reflex     Status: Abnormal   Collection Time: 01/22/16  9:14 AM  Result Value Ref Range Status   C Diff antigen POSITIVE (A) NEGATIVE Final   C Diff toxin POSITIVE (A) NEGATIVE Final   C Diff interpretation Toxin producing C. difficile detected.  Final    Comment: CRITICAL RESULT CALLED TO, READ BACK BY AND VERIFIED WITH: W.[facility] RN AT [id number] 01/22/16 BY A.DAVIS      Studies: No results found.  Scheduled Meds: . atorvastatin  10 mg Oral QHS  . [START ON 01/26/2016] darbepoetin (ARANESP) injection - DIALYSIS  150 mcg Intravenous  Q Wed-HD  . [START ON 01/26/2016] doxercalciferol  4 mcg Intravenous Q M,W,F-HD  . feeding supplement (NEPRO CARB STEADY)  237 mL Oral TID BM  . gabapentin  100 mg Oral QHS  . heparin  5,000 Units Subcutaneous Q8H  . midodrine  5 mg Oral 3 times per day on Sun Tue Thu Sat   And  . midodrine  5 mg Oral 2 times per day on Mon Wed Fri  . sevelamer carbonate  800 mg Oral BID  . vancomycin  125 mg Oral QID   Continuous Infusions: . sodium chloride      Time spent: 30 minutes   Barton Dubois  [facility] Pager [phone]. If 7PM-7AM, please contact night-coverage at [email or web address], password [facility] 01/25/2016, 6:55 PM  LOS: 3 days

## 2016-01-25 NOTE — Progress Notes (Signed)
[facility] Progress Note  Assessment/Plan: 1. C diff colitis - - po vanco - no diarrhea today 2. ESRD - MWF - last HD Friday - refused Monday and again today because he "only goes on MWF"; no excess volume on exam; labs ok to defer HD until Wed K 3.8 8/14 BUN 34 Cr 7 3. Anemia - hgb down to  9.2 -Aranesp rescheduled for Wed - since he didn't get dose on Monday- cancelled CBC for today- can check in the am. 4. Secondary hyperparathyroidism - on hectorol/renvela 5. BP/volume - no excess volume- EDW lowered for d/c, midodrine for BP support 6. Nutrition - alb 2.4 losing weight   7. Pancreatic cancer - no intervention planned 8. DNR - he is not ready to stop HD yet  [name], PA-C [place] [phone] 01/25/2016,8:42 AM  LOS: 3 days   Subjective:   No diarrhea today - only pain in lower abd from N and V. Breathing ok, Nauseated a little - no vomiting.  Wants fresh ice water and glucerna or nepro. Doesn't want dialysis today. Feels like no one is listening to him. Doesn't want labs drawn today.  Objective Vitals:   01/24/16 0952 01/24/16 1900 01/24/16 2115 01/25/16 0520  BP: 120/75 125/76 (!) 89/58 (!) 95/58  Pulse: 98 92 94 99  Resp: 16 18 18 18   Temp: 98.6 F (37 C) 98.6 F (37 C) 98.2 F (36.8 C) 97.6 F (36.4 C)  TempSrc: Oral Oral Oral Oral  SpO2: 96% 96% 96% 95%  Weight:   59.9 kg (132 lb 2.3 oz)   Height:       Physical Exam General: NAD Heart: RRR Lungs: no rales Abdomen: soft NT Extremities: left aka and right LE  Dialysis Access: left lower AVF + bruit  Dialysis Orders: [place] MWF4h 2/2.25 bath 64kg Hep none LFA AVG Hect 4 ug, Mircera 150 q 2 last 7/25  Additional Objective Labs: Basic Metabolic Panel:  Recent Labs Lab 01/21/16 1342 01/21/16 1356 01/22/16 1341 01/22/16 1751 01/24/16 1239  NA 139 137 133*  --  139  K 3.2* 3.1* 3.5  --  3.8  CL 85* 87* 86*  --  90*  CO2 32  --  27  --  30  GLUCOSE  105* 105* 180*  --  124*  BUN 39* 38* 49*  --  34*  CREATININE 8.69* 7.70* 9.18*  --  7.07*  CALCIUM 7.6*  --  7.7*  --  8.5*  PHOS  --   --   --  5.4*  --    Liver Function Tests:  Recent Labs Lab 01/21/16 1342 01/22/16 1341  AST 30 22  ALT 12* 12*  ALKPHOS 238* 185*  BILITOT 1.9* 1.5*  PROT 8.0 7.2  ALBUMIN 2.7* 2.4*    Recent Labs Lab 01/21/16 1342  LIPASE 48   CBC:  Recent Labs Lab 01/21/16 1342 01/21/16 1356 01/22/16 1341  WBC 11.8*  --  10.1  HGB 10.6* 12.9* 9.2*  HCT 33.3* 38.0* 29.7*  MCV 83.7  --  81.8  PLT 197  --  234   Blood Culture    Component Value Date/Time   SDES BLOOD RIGHT HAND 01/21/2016 1846   SPECREQUEST IN PEDIATRIC BOTTLE 3CC 01/21/2016 1846   CULT NO GROWTH 3 DAYS 01/21/2016 1846   REPTSTATUS PENDING 01/21/2016 1846    Cardiac Enzymes:  Recent Labs Lab 01/21/16 2038 01/22/16 1341  TROPONINI 0.08* 0.07*   CBG:  Recent  Labs Lab 01/21/16 1320 01/21/16 1955  GLUCAP 97 114*   Medications: . sodium chloride     . atorvastatin  10 mg Oral QHS  . darbepoetin (ARANESP) injection - DIALYSIS  150 mcg Intravenous Q Mon-HD  . feeding supplement (NEPRO CARB STEADY)  237 mL Oral TID BM  . gabapentin  100 mg Oral QHS  . heparin  5,000 Units Subcutaneous Q8H  . midodrine  5 mg Oral 3 times per day on Sun Tue Thu Sat   And  . midodrine  5 mg Oral 2 times per day on Mon Wed Fri  . sevelamer carbonate  800 mg Oral BID  . vancomycin  125 mg Oral QID

## 2016-01-26 DIAGNOSIS — E43 Unspecified severe protein-calorie malnutrition: Secondary | ICD-10-CM

## 2016-01-26 DIAGNOSIS — Z89612 Acquired absence of left leg above knee: Secondary | ICD-10-CM

## 2016-01-26 DIAGNOSIS — G43A0 Cyclical vomiting, not intractable: Secondary | ICD-10-CM

## 2016-01-26 DIAGNOSIS — C250 Malignant neoplasm of head of pancreas: Secondary | ICD-10-CM

## 2016-01-26 DIAGNOSIS — Z992 Dependence on renal dialysis: Secondary | ICD-10-CM

## 2016-01-26 DIAGNOSIS — Z789 Other specified health status: Secondary | ICD-10-CM

## 2016-01-26 DIAGNOSIS — N186 End stage renal disease: Secondary | ICD-10-CM

## 2016-01-26 DIAGNOSIS — Z7189 Other specified counseling: Secondary | ICD-10-CM

## 2016-01-26 DIAGNOSIS — Z515 Encounter for palliative care: Secondary | ICD-10-CM

## 2016-01-26 DIAGNOSIS — R112 Nausea with vomiting, unspecified: Secondary | ICD-10-CM

## 2016-01-26 LAB — RENAL FUNCTION PANEL
ANION GAP: 14 (ref 5–15)
Albumin: 1.9 g/dL — ABNORMAL LOW (ref 3.5–5.0)
BUN: 48 mg/dL — ABNORMAL HIGH (ref 6–20)
CO2: 31 mmol/L (ref 22–32)
CREATININE: 8.6 mg/dL — AB (ref 0.61–1.24)
Calcium: 7.7 mg/dL — ABNORMAL LOW (ref 8.9–10.3)
Chloride: 89 mmol/L — ABNORMAL LOW (ref 101–111)
GFR calc Af Amer: 6 mL/min — ABNORMAL LOW (ref >60)
GFR calc non Af Amer: 5 mL/min — ABNORMAL LOW (ref >60)
GLUCOSE: 141 mg/dL — AB (ref 65–99)
POTASSIUM: 3.5 mmol/L (ref 3.5–5.1)
Phosphorus: 3.3 mg/dL (ref 2.5–4.6)
Sodium: 134 mmol/L — ABNORMAL LOW (ref 135–145)

## 2016-01-26 LAB — CBC
HEMATOCRIT: 28.8 % — AB (ref 39.0–52.0)
HEMOGLOBIN: 8.9 g/dL — AB (ref 13.0–17.0)
MCH: 25.4 pg — AB (ref 26.0–34.0)
MCHC: 30.9 g/dL (ref 30.0–36.0)
MCV: 82.1 fL (ref 78.0–100.0)
Platelets: 207 10*3/uL (ref 150–400)
RBC: 3.51 MIL/uL — ABNORMAL LOW (ref 4.22–5.81)
RDW: 18.9 % — ABNORMAL HIGH (ref 11.5–15.5)
WBC: 6.4 10*3/uL (ref 4.0–10.5)

## 2016-01-26 LAB — CULTURE, BLOOD (ROUTINE X 2)
CULTURE: NO GROWTH
CULTURE: NO GROWTH

## 2016-01-26 MED ORDER — VANCOMYCIN HCL 125 MG PO CAPS: 125.0000 mg | ORAL_CAPSULE | Freq: Four times a day (QID) | ORAL | 0 refills | Status: DC

## 2016-01-26 MED ORDER — HYDROCODONE-ACETAMINOPHEN 10-325 MG PO TABS: 1.0000 | ORAL_TABLET | ORAL | 0 refills | Status: AC | PRN

## 2016-01-26 MED ORDER — DARBEPOETIN ALFA 150 MCG/0.3ML IJ SOSY
PREFILLED_SYRINGE | INTRAMUSCULAR | Status: AC
  Filled 2016-01-26: qty 0.3

## 2016-01-26 MED ORDER — DOXERCALCIFEROL 4 MCG/2ML IV SOLN
INTRAVENOUS | Status: AC
  Filled 2016-01-26: qty 2

## 2016-01-26 NOTE — Procedures (Signed)
I was present at this dialysis session. I have reviewed the session itself and made appropriate changes.   Back on HD  Schedule, which he wishes to pursue at the current time.  4K bath, labs pending. Pt in good spirits, no distress. AVF, AP ok.  For ESA today.   Filed Weights   01/24/16 2115 01/25/16 2012 01/26/16 0700  Weight: 59.9 kg (132 lb 2.3 oz) 57.2 kg (126 lb) 59.1 kg (130 lb 4.7 oz)     Recent Labs Lab 01/22/16 1751 01/24/16 1239  NA  --  139  K  --  3.8  CL  --  90*  CO2  --  30  GLUCOSE  --  124*  BUN  --  34*  CREATININE  --  7.07*  CALCIUM  --  8.5*  PHOS 5.4*  --      Recent Labs Lab 01/21/16 1342 01/21/16 1356 01/22/16 1341 01/26/16 0715  WBC 11.8*  --  10.1 6.4  HGB 10.6* 12.9* 9.2* 8.9*  HCT 33.3* 38.0* 29.7* 28.8*  MCV 83.7  --  81.8 82.1  PLT 197  --  234 207    Scheduled Meds: . atorvastatin  10 mg Oral QHS  . darbepoetin (ARANESP) injection - DIALYSIS  150 mcg Intravenous Q Wed-HD  . doxercalciferol  4 mcg Intravenous Q M,W,F-HD  . feeding supplement (NEPRO CARB STEADY)  237 mL Oral TID BM  . gabapentin  100 mg Oral QHS  . heparin  5,000 Units Subcutaneous Q8H  . midodrine  5 mg Oral 3 times per day on Sun Tue Thu Sat   And  . midodrine  5 mg Oral 2 times per day on Mon Wed Fri  . sevelamer carbonate  800 mg Oral BID  . vancomycin  125 mg Oral QID   Continuous Infusions: . sodium chloride     PRN Meds:.sodium chloride, sodium chloride, acetaminophen, alteplase, heparin, HYDROmorphone (DILAUDID) injection, lidocaine (PF), lidocaine-prilocaine, menthol-cetylpyridinium, ondansetron **OR** ondansetron (ZOFRAN) IV, oxyCODONE, pentafluoroprop-tetrafluoroeth, promethazine   Pearson Grippe  MD 01/26/2016, 8:31 AM

## 2016-01-26 NOTE — Clinical Social Work Note (Signed)
Clinical Social Work Assessment  Patient Details  Name: Sean [name] MRN: [id number] Date of Birth: 1935/07/18  Date of referral:  01/26/16               Reason for consult:  Facility Placement (From [facility])                Permission sought to share information with:  Family Supports (Patient informed CSW that family was aware of his discharge back to facility and he would make contact.) Permission granted to share information::  No  Name::        Agency::     Relationship::     Contact Information:     Housing/Transportation Living arrangements for the past 2 months:  [place] ([place]) Source of Information:  Patient, Other (Comment Required) (Chart) Patient Interpreter Needed:  None Criminal Activity/Legal Involvement Pertinent to Current Situation/Hospitalization:  No - Comment as needed Significant Relationships:  Adult Children, Other Family Members Lives with:  Facility Resident Do you feel safe going back to the place where you live?  Yes Need for family participation in patient care:  Yes (Comment)  Care giving concerns:  Patient expressed no concerns regarding the care he receives at [facility].   Social Worker assessment / plan:  CSW talked with at the bedside regarding his readiness for discharge and to confirm return to [facility]. Mr. Slyman was lying in bed and was awake and alert. Patient confirmed return to facility and when asked, informed CSW that his family is aware that he is discharging back to facility and did not give permission for CSW to contact his family.  Employment status:  Retired Forensic scientist:  [employer], Manufacturing engineer) PT Recommendations:  Not assessed at this time Information / Referral to community resources:  Other (Comment Required) (None needed or requested as patient from skilled facility)  Patient/Family's Response to care:  No concerns expressed by patient regarding care at skilled  facility.  Patient/Family's Understanding of and Emotional Response to Diagnosis, Current Treatment, and Prognosis: Not discussed.  Emotional Assessment Appearance:  Appears stated age Attitude/Demeanor/Rapport:  Other (Appropriate) Affect (typically observed):  Appropriate, Pleasant Orientation:  Oriented to Self, Oriented to Place, Oriented to  Time, Oriented to Situation Alcohol / Substance use:  Tobacco Use, Alcohol Use, Illicit Drugs (Patient reports that he quit smoking 06/12/76 and does not drink alcohol or use illicit drugs) Psych involvement (Current and /or in the community):  No (Comment)  Discharge Needs  Concerns to be addressed:  Discharge Planning Concerns Readmission within the last 30 days:  Yes Current discharge risk:  None Barriers to Discharge:  No Barriers Identified   [name], LCSW 01/26/2016, 6:46 PM

## 2016-01-26 NOTE — Clinical Social Work Note (Signed)
Sean [name] is medically stable for discharge and is returning to [facility] today. Discharge clinicals transmitted to facility and patient will be transported by ambulance to [facility]. Patient did not give CSW permission to contact family, indicating that they are aware that he is returning to facility and he will make contact with family.  [name], MSW, LCSW Licensed Clinical Social Worker [place] [phone]

## 2016-01-26 NOTE — NC FL2 (Signed)
[place] LEVEL OF CARE SCREENING TOOL     IDENTIFICATION  Patient Name: Sean [name] Birthdate: [birth year] Sex: male Admission Date (Current Location): 01/21/2016  [facility] and [place] Number:  Herbalist and Address:  [facility] [place] [place] [address], [place], [place] [address]      Provider Number: [id number]  Attending Physician Name and Address:  Verlee Monte, MD  Relative Name and Phone Number:  [name] - daughter.  Phone [phone]    Current Level of Care: Hospital Recommended Level of Care: [place] (Patient from [facility]) Prior Approval Number:    Date Approved/Denied:   PASRR Number:    Discharge Plan: SNF    Current Diagnoses: Patient Active Problem List   Diagnosis Date Noted  . Palliative care by specialist   . Advance care planning   . Protein-calorie malnutrition, severe 01/23/2016  . Nausea and vomiting 01/22/2016  . Nausea & vomiting 01/21/2016  . Primary cancer of head of pancreas ([place]) 01/14/2016  . Palliative care encounter   . Encounter for palliative care   . Goals of care, counseling/discussion   . Duodenal ulcer disease   . Esophagitis   . Pancreatic mass 11/26/2015  . Sepsis ([place]) 11/23/2015  . Chronic systolic CHF (congestive heart failure) ([place])   . Gallstone pancreatitis 11/07/2015  . Abdominal pain, epigastric 11/07/2015  . PAD (peripheral artery disease) ([place]) 09/14/2015  . Phantom pain ([place]) 09/14/2015  . Hemodialysis-associated hypotension 09/14/2015  . Protein-calorie malnutrition ([place]) 09/14/2015  . Type 2 diabetes mellitus with chronic kidney disease on chronic dialysis, without long-term current use of insulin ([place]) 03/25/2015  . Chronic combined systolic and diastolic heart failure ([place]) 03/01/2015  . Neuropathic pain 01/12/2015  . Abnormal CT scan 05/29/2014  . Cholelithiasis   . Atherosclerotic peripheral vascular disease with ulceration ([place])    . S/P AKA (above knee amputation) unilateral ([place]) 05/03/2014  . Dyslipidemia 04/07/2014  . Femoropopliteal arterial thrombosis of left lower extremity ([place]) 01/27/2014  . Depression 01/01/2014  . Secondary hyperparathyroidism ([place]) 12/20/2013  . CAD (coronary artery disease), native coronary artery 12/16/2013  . Diabetes mellitus due to underlying condition ([place]) 09/19/2011  . Anxiety state 01/15/2009  . Acute myocardial infarction ([place]) 01/15/2009  . End stage renal disease on dialysis ([place]) 01/15/2009    Orientation RESPIRATION BLADDER Height & Weight     Self, Time, Situation, Place  Normal Continent Weight: 130 lb 11.7 oz (59.3 kg) Height:  5\' 5"  (165.1 cm)  BEHAVIORAL SYMPTOMS/MOOD NEUROLOGICAL BOWEL NUTRITION STATUS      Continent Diet (Low sodium, heart healthy )  AMBULATORY STATUS COMMUNICATION OF NEEDS Skin   Limited Assist (Left AKA) Verbally Normal                       Personal Care Assistance Level of Assistance  Bathing, Feeding, Dressing Bathing Assistance: Limited assistance Feeding assistance: Independent Dressing Assistance: Limited assistance     Functional Limitations Info  Sight, Hearing, Speech Sight Info: Adequate Hearing Info: Adequate Speech Info: Adequate    SPECIAL CARE FACTORS FREQUENCY                       Contractures Contractures Info: Not present    Additional Factors Info  Allergies   Allergies Info: Benazepril Hcl           Current Medications (01/26/2016):  This is the current hospital active medication list  Current Facility-Administered Medications  Medication Dose Route Frequency Provider Last Rate Last Dose  . 0.9 %  sodium chloride infusion   Intravenous Continuous Oswald Hillock, MD      . acetaminophen (TYLENOL) tablet 650 mg  650 mg Oral Q6H PRN Oswald Hillock, MD   650 mg at 01/23/16 1052  . atorvastatin (LIPITOR) tablet 10 mg  10 mg Oral QHS Oswald Hillock, MD   10 mg at 01/23/16 2249  . Darbepoetin Alfa  (ARANESP) injection 150 mcg  150 mcg Intravenous Q Wed-HD Alric Seton, PA-C   150 mcg at 01/26/16 0751  . doxercalciferol (HECTOROL) injection 4 mcg  4 mcg Intravenous Q M,W,F-HD Alric Seton, PA-C   4 mcg at 01/26/16 [id number]  . feeding supplement (NEPRO CARB STEADY) liquid 237 mL  237 mL Oral TID BM Barton Dubois, MD   237 mL at 01/26/16 1529  . gabapentin (NEURONTIN) capsule 100 mg  100 mg Oral QHS Oswald Hillock, MD   100 mg at 01/23/16 2248  . heparin injection 5,000 Units  5,000 Units Subcutaneous Q8H Gardiner Barefoot, NP   5,000 Units at 01/26/16 1529  . HYDROmorphone (DILAUDID) injection 0.5 mg  0.5 mg Intravenous Q4H PRN Oswald Hillock, MD      . menthol-cetylpyridinium (CEPACOL) lozenge 3 mg  1 lozenge Oral PRN Ritta Slot, NP      . midodrine (PROAMATINE) tablet 5 mg  5 mg Oral 3 times per day on Sun Tue Thu Sat Oswald Hillock, MD   5 mg at 01/23/16 2248   And  . midodrine (PROAMATINE) tablet 5 mg  5 mg Oral 2 times per day on Mon Wed Fri Oswald Hillock, MD   5 mg at 01/26/16 1801  . ondansetron (ZOFRAN) tablet 4 mg  4 mg Oral Q6H PRN Oswald Hillock, MD       Or  . ondansetron (ZOFRAN) injection 4 mg  4 mg Intravenous Q6H PRN Oswald Hillock, MD   4 mg at 01/24/16 1221  . oxyCODONE (Oxy IR/ROXICODONE) immediate release tablet 5 mg  5 mg Oral Q8H PRN Barton Dubois, MD   5 mg at 01/23/16 2248  . promethazine (PHENERGAN) injection 12.5 mg  12.5 mg Intravenous Q6H PRN Barton Dubois, MD   12.5 mg at 01/24/16 1455  . sevelamer carbonate (RENVELA) tablet 800 mg  800 mg Oral BID Oswald Hillock, MD   800 mg at 01/26/16 1133  . vancomycin (VANCOCIN) 50 mg/mL oral solution 125 mg  125 mg Oral QID Barton Dubois, MD   125 mg at 01/26/16 1529     Discharge Medications: Please see discharge summary for a list of discharge medications.  Relevant Imaging Results:  Relevant Lab Results:   Additional Information ss#[id number]. Dialysis patient MWF, [facility]  [place], [place], [place]

## 2016-01-26 NOTE — Consult Note (Signed)
Consultation Note Date: 01/26/2016   Patient Name: Sean [name]  DOB: [birth year]  MRN: [id number]  Age / Sex: 80 y.o., male  PCP: [name], MD Referring Physician: Verlee Monte, MD  Reason for Consultation: Establishing goals of care  HPI/Patient Profile: 80 y.o. male  with past medical history ofESRD, CAD CHF, cholelithiasis  admitted on 01/21/2016 with nausea and vomiting resulting from SIRS resulting from C. Diff. He has recently been diagnosed with pancreatic cancer and has chosen no treatment. He has ESRD and is on dialysis. Palliative was consulted to discuss patient's advance care planning and possibility of stopping dialysis.    Clinical Assessment and Goals of Care:  I met with patient during his dialysis session. Very pleasant gentleman, oriented and feeling well. He very adamantly insisted that he did not want to stop dialysis. He enjoys going to dialysis and feels this contributes to his quality of life at this point. He stated he did express a desire to "take a break" when he was sick with the nausea and vomiting. To him, taking a break was skipping dialysis for a day or two to allow him to recover. He has a good understanding of his cancer prognosis. He wishes to continue dialysis until he can no longer physically attend. He understands that upon stopping dialysis his prognosis will be limited to days and he says when he doesn't feel good anymore he will choose to stop. He agrees to Hospice follow up upon return to facility.     Primary Decision Maker: Patient    SUMMARY OF RECOMMENDATIONS -Hospice followup at [place] DNR -Continue dialysis for now    Code Status/Advance Care Planning:  DNR    Symptom Management:   Pt symptoms currently controlled- recommend Hospice to manage at discharge  Palliative Prophylaxis:    No recommendations   Additional  Recommendations (Limitations, Scope, Preferences):  Avoid Hospitalization, No Artificial Feeding, No Chemotherapy, No Surgical Procedures and No Tracheostomy  Psycho-social/Spiritual:   Desire for further Chaplaincy support: No  Additional Recommendations: None  Prognosis:   < 6 months d/t pancreatic cancer with no treatment planned  Discharge Planning: [place] with Hospice       Primary Diagnoses: Present on Admission: . Nausea & vomiting . Primary cancer of head of pancreas ([place]) . Pancreatic mass . Nausea and vomiting   I have reviewed the medical record, interviewed the patient and family, and examined the patient. The following aspects are pertinent.  Past Medical History:  Diagnosis Date  . Arthritis   . CAD (coronary artery disease)    a. s/p NSTEMI and failed RCA PCI in 2011. b. Nuc 12/2013: nonischemic, prior inferior MI.  . Calciphylaxis    a. Multiple admissions for nonhealing L ulcer/sepsis, s/p L AKA 02/02/2014.  Marland Kitchen CHF (congestive heart failure) ([place])   . Cholelithiasis 01/2014   acute cholecystitis managed with perc drain due to high surgical risk.   . Chronic anemia   . Dementia   . Depression with anxiety   .  Dialysis patient [facility])    M, W, F; [place] ESRD (end stage renal disease) on dialysis ([place])   . GERD (gastroesophageal reflux disease)   . Heart attack ([place]) 2011; 1990's  . History of left bundle branch block   . Hx of above knee amputation ([place])    left  . Hyperlipidemia   . Hyperparathyroidism   . Hypertension   . Hypotension    renovascular  . LBBB (left bundle branch block)   . Migraine    none recent  . Pancreatic mass   . Peripheral neuropathy ([place])   . Renal insufficiency   . Stroke [facility])    several, last cva 2015 or 2014  . Type II diabetes mellitus ([place])    not taking any medications.   Social History   Social History  . Marital status: Married    Spouse name: N/A  . Number of children:  N/A  . Years of education: N/A   Social History Main Topics  . Smoking status: Former Smoker    Packs/day: 0.25    Years: 7.00    Types: Cigarettes    Quit date: 06/12/1976  . Smokeless tobacco: Never Used  . Alcohol use No  . Drug use: No  . Sexual activity: No   Other Topics Concern  . 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 Cancer Sister     breast  . Cancer Brother     lung  . Anesthesia problems Neg Hx   . Hypotension Neg Hx   . Malignant hyperthermia Neg Hx   . Pseudochol deficiency Neg Hx    Scheduled Meds: . atorvastatin  10 mg Oral QHS  . darbepoetin (ARANESP) injection - DIALYSIS  150 mcg Intravenous Q Wed-HD  . doxercalciferol  4 mcg Intravenous Q M,W,F-HD  . feeding supplement (NEPRO CARB STEADY)  237 mL Oral TID BM  . gabapentin  100 mg Oral QHS  . heparin  5,000 Units Subcutaneous Q8H  . midodrine  5 mg Oral 3 times per day on Sun Tue Thu Sat   And  . midodrine  5 mg Oral 2 times per day on Mon Wed Fri  . sevelamer carbonate  800 mg Oral BID  . vancomycin  125 mg Oral QID   Continuous Infusions: . sodium chloride     PRN Meds:.acetaminophen, HYDROmorphone (DILAUDID) injection, menthol-cetylpyridinium, ondansetron **OR** ondansetron (ZOFRAN) IV, oxyCODONE, promethazine Medications Prior to Admission:  Prior to Admission medications   Medication Sig Start Date End Date Taking? Authorizing Provider  acetaminophen (TYLENOL) 325 MG tablet Take 650 mg by mouth every 6 (six) hours as needed for mild pain, fever or headache (DO NOT EXCEED 4 GM OF APAP IN 24 HOURS FORM ALL SOURCES).    Yes Historical Provider, MD  Amino Acids-Protein Hydrolys (FEEDING SUPPLEMENT, PRO-STAT SUGAR FREE 64,) LIQD Take  30 mLs by mouth 2 (two) times daily.   Yes Historical Provider, MD  atorvastatin (LIPITOR) 10 MG tablet Take 10 mg by mouth at bedtime.   Yes Historical Provider, MD  B Complex-C (B-COMPLEX WITH VITAMIN C) tablet Take 1 tablet by mouth daily.   Yes  Historical Provider, MD  docusate sodium (COLACE) 100 MG capsule Take 100 mg by mouth at bedtime.    Yes Historical Provider, MD  gabapentin (NEURONTIN) 100 MG capsule Take 100 mg by mouth at bedtime.    Yes Historical Provider, MD  midodrine (PROAMATINE) 5 MG tablet Take 5 mg by mouth See admin instructions. Take 1 tablet (5 mg) by mouth 3 times daily on Sunday, Tuesday, Thursday, Saturday (7:30am, 2pm, 10pm); take 1 tablet (5 mg) by mouth 2 times daily on dialysis days - Monday, Wednesday, Friday (2pm and 10pm) 02/01/15  Yes Historical Provider, MD  ondansetron (ZOFRAN) 4 MG tablet Take 1 tablet (4 mg total) by mouth every 8 (eight) hours. Patient taking differently: Take 4 mg by mouth every 8 (eight) hours as needed for nausea or vomiting.  01/20/16  Yes Dinah C Ngetich, NP  promethazine (PHENERGAN) 25 MG suppository Place 25 mg rectally every 6 (six) hours as needed for nausea or vomiting.   Yes Historical Provider, MD  sevelamer carbonate (RENVELA) 800 MG tablet Take 800 mg by mouth See admin instructions. Take 1 tablet (800 mg) by mouth daily with lunch and dinner (12pm -5pm)   Yes Historical Provider, MD  sorbitol 70 % solution Take 30 mLs by mouth daily as needed (severe constipation).   Yes Historical Provider, MD  HYDROcodone-acetaminophen (NORCO) 10-325 MG tablet Take 1 tablet by mouth every 4 (four) hours as needed (pain). 01/26/16   Mutaz Elmahi, MD  vancomycin (VANCOCIN HCL) 125 MG capsule Take 1 capsule (125 mg total) by mouth 4 (four) times daily. 01/26/16   Mutaz Elmahi, MD   Allergies  Allergen Reactions  . Benazepril Hcl Hives, Itching and Rash    flareups on patient head   Review of Systems  Constitutional: Positive for activity change.  Negative for appetite change and chills.  HENT: Negative.   Respiratory: Negative.   Cardiovascular: Negative.   Gastrointestinal: Negative.   Musculoskeletal: Negative.   Neurological: Positive for numbness.  Psychiatric/Behavioral: Negative.     Physical Exam  Constitutional: He is oriented to person, place, and time. He appears well-developed and well-nourished.  HENT:  Head: Normocephalic and atraumatic.  Eyes: EOM are normal. Pupils are equal, round, and reactive to light.  Cardiovascular: Normal rate and regular rhythm.   Pulmonary/Chest: Effort normal and breath sounds normal.  Abdominal: Soft. Bowel sounds are normal.  Musculoskeletal:  Left bka  Neurological: He is alert and oriented to person, place, and time.  Skin: Skin is warm and dry.  Psychiatric: He has a normal mood and affect. His behavior is normal. Judgment and thought content normal.    Vital Signs: BP (!) 86/50 (BP Location: Right Arm)   Pulse 96   Temp 98.5 F (36.9 C) (Oral)   Resp 16   Ht 5' 5" (1.651 m)   Wt 59.3 kg (130 lb 11.7 oz)   SpO2 98%   BMI 21.76 kg/m  Pain Assessment: No/denies pain   Pain Score: Asleep   SpO2: SpO2: 98 % O2 Device:SpO2: 98 % O2 Flow Rate: .O2 Flow Rate (L/min): 2.5 L/min  IO: Intake/output summary:   Intake/Output Summary (Last 24 hours) at 01/26/16 1348 Last data filed at 01/26/16 1230  Gross per 24 hour  Intake              84 0 ml  Output             -200 ml  Net  1040 ml    LBM: Last BM Date: 01/24/16 Baseline Weight: Weight: 59.3 kg (130 lb 11.7 oz) Most recent weight: Weight: 59.3 kg (130 lb 11.7 oz)     Palliative Assessment/Data:  PPS: 70%   Flowsheet Rows   Flowsheet Row Most Recent Value  Intake Tab  Referral Department  Hospitalist  Unit at Time of Referral  Med/Surg Unit  Palliative Care Primary Diagnosis  Cancer  Date Notified  01/25/16  Palliative Care Type  New Palliative care  Reason for referral  Clarify Goals of Care   Date of Admission  01/21/16  Date first seen by Palliative Care  01/26/16  # of days Palliative referral response time  1 Day(s)  # of days IP prior to Palliative referral  4  Clinical Assessment  Psychosocial & Spiritual Assessment  Palliative Care Outcomes      Time In: 0830 Time Out: 0900 Time Total: 30 minutes Greater than 50%  of this time was spent counseling and coordinating care related to the above assessment and plan.  Signed by: Mariana Kaufman, AGNP-C Palliative Medicine   Please contact Palliative Medicine Team phone at [phone] for questions and concerns.  For individual provider: See Shea Evans

## 2016-01-26 NOTE — Discharge Summary (Signed)
Physician Discharge Summary  Sean [name] [id number] DOB: 1935-07-30 DOA: 01/21/2016  PCP: [name], MD  Admit date: 01/21/2016 Discharge date: 01/26/2016  Admitted From: [facility] Disposition:  [facility]  Recommendations for Outpatient Follow-up:  1. Follow up with PCP in 1-2 weeks 2. Please obtain BMP/CBC in one week 3. Continue oral vancomycin 125 mg by mouth 4 times a day for 4 more days. 4. Palliative care to follow-up in the nursing home  Home Health: No Equipment/Devices: N/A  Discharge Condition: Stable CODE STATUS: DNR/DNI Diet recommendation: Renal diet  Brief/Interim Summary: Sean [name]  is a 80 y.o. 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 Mike Craze, recommended hospice. Patient is not a candidate for surgery, and does not want chemotherapy. Patient undergoes dialysis Monday Wednesday Friday, and he missed dialysis on Wednesday and Friday. Patient says that he was a nausea and vomiting and was feeling extremely weak, so he did not go for hemodialysis. He admits to having chest pain, denies shortness of breath. Has poor by mouth intake. CT abdomen and pelvis was done in the ED which showed pancreatic mass, lung opacities bilaterally which could be infectious process. Patient empirically started on vancomycin and Zosyn for possible sepsis. No fluid boluses given as patient has ESRD, and vitals are stable at this time.  Discharge Diagnoses:  Active Problems:   End stage renal disease on dialysis (HCC)   S/P AKA (above knee amputation) unilateral (HCC)   Pancreatic mass   Primary cancer of head of pancreas (HCC)   Nausea & vomiting   Nausea and vomiting   Protein-calorie malnutrition, severe   C. diff colitis -Patient nontoxic in appearance (chronically with hypotension requiring midodrine; and with lactic acid most  likely from mild dehydration and lack of hemodialysis prior to admission). -No fever, no shortness of breath and normal WBCs. -Patient was having diarrhea and his stool culture demonstrated positive C. Diff (antigen and toxin) -Started on oral vancomycin, diarrhea improved, he is back to his baseline. -Discharged back to the nursing home to continue vancomycin for 10 more days.  History of pancreatic cancer: -Not a candidate for resection and not interested in pursuing chemotherapy or any further invasive/heroic treatment -CODE STATUS has been changed after discussing with patient to DO NOT RESUSCITATE and the plan is for him to discharge back to nursing home with palliative care following in as an outpatient. -Continue as needed analgesics (will use PRN oxycodone) and IV dilaudid for severe pain  End-stage renal disease: -Renal service is on board and will follow recommendations for hemodialysis -Patient will like to continue hemodialysis treatment until he is symptomatically or physically unable to attend.  Hypokalemia: -To be repleted with his hemodialysis session.  Chronic hypotension -will continue use of midodrine   Anemia of chronic disease: Secondary to renal failure -Iron and Epogen infusions to be determined by renal service -hemoglobin is stable  HLD:  -will continue statins for now  Hyperparathyroidism: -will continue renvela   Elevated troponin -Currently without any chest pain and no shortness of breath -Most likely due to chronic renal failure -telemetry has been discontinued .  Severe protein calorie malnutrition  -will continue nepro TID   Discharge Instructions  Discharge Instructions    Diet - low sodium heart healthy    Complete by:  As directed   Increase activity slowly    Complete by:  As directed       Medication List    TAKE these medications   acetaminophen 325 MG tablet Commonly known as:  TYLENOL Take 650 mg by mouth every 6  (six) hours as needed for mild pain, fever or headache (DO NOT EXCEED 4 GM OF APAP IN 24 HOURS FORM ALL SOURCES).   atorvastatin 10 MG tablet Commonly known as:  LIPITOR Take 10 mg by mouth at bedtime.   B-complex with vitamin C tablet Take 1 tablet by mouth daily.   docusate sodium 100 MG capsule Commonly known as:  COLACE Take 100 mg by mouth at bedtime.   feeding supplement (PRO-STAT SUGAR FREE 64) Liqd Take 30 mLs by mouth 2 (two) times daily.   gabapentin 100 MG capsule Commonly known as:  NEURONTIN Take 100 mg by mouth at bedtime.   HYDROcodone-acetaminophen 10-325 MG tablet Commonly known as:  NORCO Take 1 tablet by mouth every 4 (four) hours as needed (pain).   midodrine 5 MG tablet Commonly known as:  PROAMATINE Take 5 mg by mouth See admin instructions. Take 1 tablet (5 mg) by mouth 3 times daily on Sunday, Tuesday, Thursday, Saturday (7:30am, 2pm, 10pm); take 1 tablet (5 mg) by mouth 2 times daily on dialysis days - Monday, Wednesday, Friday (2pm and 10pm)   ondansetron 4 MG tablet Commonly known as:  ZOFRAN Take 1 tablet (4 mg total) by mouth every 8 (eight) hours. What changed:  when to take this  reasons to take this   promethazine 25 MG suppository Commonly known as:  PHENERGAN Place 25 mg rectally every 6 (six) hours as needed for nausea or vomiting.   sevelamer carbonate 800 MG tablet Commonly known as:  RENVELA Take 800 mg by mouth See admin instructions. Take 1 tablet (800 mg) by mouth daily with lunch and dinner (12pm -5pm)   sorbitol 70 % solution Take 30 mLs by mouth daily as needed (severe constipation).   vancomycin 125 MG capsule Commonly known as:  VANCOCIN HCL Take 1 capsule (125 mg total) by mouth 4 (four) times daily.       Allergies  Allergen Reactions  . Benazepril Hcl Hives, Itching and Rash    flareups on patient head    Consultations:  Nephrology   Procedures/Studies: Dg Chest 2 View  Result Date:  01/21/2016 CLINICAL DATA:  Pt has been having chest pains and SOB.Hx of CHF, DM, HTN, renal insufficiency, peripheral neuropathy, dialysis patient EXAM: CHEST  2 VIEW COMPARISON:  11/22/2015 FINDINGS: Cardiac silhouette is mildly enlarged. The aorta is uncoiled. No mediastinal or hilar masses or evidence of adenopathy. Elevation of left hemidiaphragm. There is atelectasis at the left lung base and lateral to the left hilum. No convincing pneumonia. No evidence of pulmonary edema. No pleural effusion or pneumothorax. Bony thorax is demineralized but grossly intact. IMPRESSION: No acute cardiopulmonary disease. Electronically Signed   By: Lajean Manes M.D.   On: 01/21/2016 14:54   Ct Abdomen Pelvis W Contrast  Result Date: 01/21/2016 CLINICAL DATA:  Patient has been vomiting for about a week, and generalize weakness. He has been on dialysis for 15 years EXAM: CT ABDOMEN AND PELVIS WITH CONTRAST TECHNIQUE: Multidetector CT imaging of the abdomen and pelvis was performed using the standard protocol following bolus administration of intravenous contrast. CONTRAST:  21mL ISOVUE-300 IOPAMIDOL (ISOVUE-300) INJECTION 61% COMPARISON:  11/22/2015 FINDINGS: Lung bases: Heart is mildly enlarged. There are coronary artery calcifications. Multiple calcified nodes are noted along the hila. Patchy areas  of opacity are noted in the lungs. There is a 2.6 x 2.5 cm focal opacity in the left lower lobe. There is a 7 mm focal opacity in the posterior left lower lobe, image 2, series 3. Patchy areas of focal opacity noted in the right lower lobe. There are minimal pleural effusions. Pancreas: Ill-defined mass extends throughout most of the pancreatic head, neck and body and uncinate process, surrounding the superior mesenteric artery and celiac artery and branch vessels. Mass abuts and may invade the anterior aspect of the aorta. Mass abuts the posterior margin of the stomach. It measures 4.4 cm in thickness at this level, but also  extends along the gastrohepatic ligament to contact the pylorus and first and second portions of the duodenum. There are adjacent prominent to mildly enlarged lymph nodes along the gastrohepatic ligament and peripancreatic and periceliac chains. The tail the pancreas is atrophic with cystic dilation of the duct. 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 The stomach is significantly distended, which may be due to a gastroparesis or partial mechanical obstruction, due to the pancreatic mass. The ligament of Treitz region of the bowel is in close proximity to the mass and may be involved. Hepatobiliary: No liver mass or focal lesion. Gallbladder is distended. Small amount of sludge is evident dependently. No evidence acute cholecystitis. No bile duct dilation. Spleen: Mildly enlarged measuring 13.5 cm. No mass or focal lesion. Adrenal glands:  No masses. Kidneys, ureters, bladder: Marked renal atrophy with numerous bilateral renal cysts consistent with acquired renal cystic disease. No hydronephrosis. Bladder is decompressed. Vascular: Extensive arterial vascular calcifications noted along the aorta and throughout all branch vessels. Ascites: Trace amount of ascites seen adjacent to the liver and spleen. Prostate gland:  Enlarged and heterogeneous. Gastrointestinal: Distention of the distal esophagus with mild wall thickening. No evidence of small bowel or colonic obstruction. No wall thickening or inflammatory changes. There are prominent vessels throughout the mesenteric. Abdominal wall:  Midline scarring.  Diffuse subcutaneous edema. Musculoskeletal: Findings of renal osteodystrophy. Numerous Schmorl's nodes throughout the visualized spine. Large cysts along the acetabula. IMPRESSION: 1. As noted on the prior CT, an ill-defined mass is centered on the pancreas involving the head, neck, body and uncinate process. Mass surrounds the  celiac axis and superior mesenteric vessels, abuts and possibly invades the anterior aspect of the abdominal aorta and abuts the posterior margin of the stomach. It extends to abut the first and second portions of the duodenum. 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 This is highly suspicious for infiltrative pancreatic carcinoma. 2. Stomach is distended. This suggests either partial functional obstruction of the stomach or possibly mechanical obstruction in the region of the ligament of Treitz. 3. Trace amount of ascites. No definitive peritoneal carcinomatosis, although this is not excluded. 4. Lung base opacities that may be due to atelectasis or infection or a combination. Neoplastic/ metastatic disease is possible. Small pleural effusions. 5. Multiple chronic findings including end-stage renal disease and bony changes of renal osteodystrophy. Electronically Signed   By: Lajean Manes M.D.   On: 01/21/2016 15:48    (Echo, Carotid, EGD, Colonoscopy, ERCP)    Subjective:   Discharge Exam: Vitals:   01/26/16 1034 01/26/16 1039  BP: (!) 79/52 (!) 83/46  Pulse: 82 91  Resp:  17  Temp:  98.7 F (37.1 C)   Vitals:   01/26/16 1000 01/26/16 1023 01/26/16 1034 01/26/16 1039  BP: (!) 86/47 (!) 74/45 (!) 79/52 (!) 83/46  Pulse: 94 96 82 91  Resp:    17  Temp:    98.7 F (37.1 C)  TempSrc:    Oral  SpO2:      Weight:    59.3 kg (130 lb 11.7 oz)  Height:        General: Pt is alert, awake, not in acute distress Cardiovascular: RRR, S1/S2 +, no rubs, no gallops Respiratory: CTA bilaterally, no wheezing, no rhonchi Abdominal: Soft, NT, ND, bowel sounds + Extremities: no edema, no cyanosis    The results of significant diagnostics from this hospitalization (including imaging, microbiology, ancillary and laboratory) are listed below for reference.     Microbiology: Recent Results (from the past 240 hour(s))  Culture, blood (Routine X 2) w Reflex  to ID Panel     Status: None (Preliminary result)   Collection Time: 01/21/16  6:40 PM  Result Value Ref Range Status   Specimen Description BLOOD RIGHT THUMB  Final   Special Requests IN PEDIATRIC BOTTLE 3CC  Final   Culture NO GROWTH 4 DAYS  Final   Report Status PENDING  Incomplete  Culture, blood (Routine X 2) w Reflex to ID Panel     Status: None (Preliminary result)   Collection Time: 01/21/16  6:46 PM  Result Value Ref Range Status   Specimen Description BLOOD RIGHT HAND  Final   Special Requests IN PEDIATRIC BOTTLE 3CC  Final   Culture NO GROWTH 4 DAYS  Final   Report Status PENDING  Incomplete  MRSA PCR Screening     Status: None   Collection Time: 01/21/16 10:29 PM  Result Value Ref Range Status   MRSA by PCR NEGATIVE NEGATIVE Final    Comment:        The GeneXpert MRSA Assay (FDA approved for NASAL specimens only), is one component of a comprehensive MRSA colonization surveillance program. It is not intended to diagnose MRSA infection nor to guide or monitor treatment for MRSA infections.   C difficile quick scan w PCR reflex     Status: Abnormal   Collection Time: 01/22/16  9:14 AM  Result Value Ref Range Status   C Diff antigen POSITIVE (A) NEGATIVE Final   C Diff toxin POSITIVE (A) NEGATIVE Final   C Diff interpretation Toxin producing C. difficile detected.  Final    Comment: CRITICAL RESULT CALLED TO, READ BACK BY AND VERIFIED WITH: W.[email or web address] RN AT [id number] 01/22/16 BY A.DAVIS      Labs: BNP (last 3 results) No results for input(s): BNP in the last 8760 hours. Basic Metabolic Panel:  Recent Labs Lab 01/21/16 1342 01/21/16 1356 01/22/16 1341 01/22/16 1751 01/24/16 1239 01/26/16 0715  NA 139 137 133*  --  139 134*  K 3.2* 3.1* 3.5  --  3.8 3.5  CL 85* 87* 86*  --  90* 89*  CO2 32  --  27  --  30 31  GLUCOSE 105* 105* 180*  --  124* 141*  BUN 39* 38* 49*  --  34* 48*  CREATININE 8.69* 7.70* 9.18*  --  7.07* 8.60*  CALCIUM 7.6*  --  7.7*  --  8.5*  7.7*  PHOS  --   --   --  5.4*  --  3.3   Liver Function Tests:  Recent Labs Lab 01/21/16 1342 01/22/16 1341 01/26/16 0715  AST 30 22  --   ALT 12* 12*  --   ALKPHOS 238*  185*  --   BILITOT 1.9* 1.5*  --   PROT 8.0 7.2  --   ALBUMIN 2.7* 2.4* 1.9*    Recent Labs Lab 01/21/16 1342  LIPASE 48   No results for input(s): AMMONIA in the last 168 hours. CBC:  Recent Labs Lab 01/21/16 1342 01/21/16 1356 01/22/16 1341 01/26/16 0715  WBC 11.8*  --  10.1 6.4  HGB 10.6* 12.9* 9.2* 8.9*  HCT 33.3* 38.0* 29.7* 28.8*  MCV 83.7  --  81.8 82.1  PLT 197  --  234 207   Cardiac Enzymes:  Recent Labs Lab 01/21/16 2038 01/22/16 1341  TROPONINI 0.08* 0.07*   BNP: Invalid input(s): POCBNP CBG:  Recent Labs Lab 01/21/16 1320 01/21/16 1955  GLUCAP 97 114*   D-Dimer No results for input(s): DDIMER in the last 72 hours. Hgb A1c No results for input(s): HGBA1C in the last 72 hours. Lipid Profile No results for input(s): CHOL, HDL, LDLCALC, TRIG, CHOLHDL, LDLDIRECT in the last 72 hours. Thyroid function studies No results for input(s): TSH, T4TOTAL, T3FREE, THYROIDAB in the last 72 hours.  Invalid input(s): FREET3 Anemia work up No results for input(s): VITAMINB12, FOLATE, FERRITIN, TIBC, IRON, RETICCTPCT in the last 72 hours. Urinalysis No results found for: COLORURINE, APPEARANCEUR, [place], [place], [place], [place], [place], [place], PROTEINUR, UROBILINOGEN, NITRITE, LEUKOCYTESUR Sepsis Labs Invalid input(s): PROCALCITONIN,  WBC,  LACTICIDVEN Microbiology Recent Results (from the past 240 hour(s))  Culture, blood (Routine X 2) w Reflex to ID Panel     Status: None (Preliminary result)   Collection Time: 01/21/16  6:40 PM  Result Value Ref Range Status   Specimen Description BLOOD RIGHT THUMB  Final   Special Requests IN PEDIATRIC BOTTLE 3CC  Final   Culture NO GROWTH 4 DAYS  Final   Report Status PENDING  Incomplete  Culture, blood (Routine X 2) w Reflex  to ID Panel     Status: None (Preliminary result)   Collection Time: 01/21/16  6:46 PM  Result Value Ref Range Status   Specimen Description BLOOD RIGHT HAND  Final   Special Requests IN PEDIATRIC BOTTLE 3CC  Final   Culture NO GROWTH 4 DAYS  Final   Report Status PENDING  Incomplete  MRSA PCR Screening     Status: None   Collection Time: 01/21/16 10:29 PM  Result Value Ref Range Status   MRSA by PCR NEGATIVE NEGATIVE Final    Comment:        The GeneXpert MRSA Assay (FDA approved for NASAL specimens only), is one component of a comprehensive MRSA colonization surveillance program. It is not intended to diagnose MRSA infection nor to guide or monitor treatment for MRSA infections.   C difficile quick scan w PCR reflex     Status: Abnormal   Collection Time: 01/22/16  9:14 AM  Result Value Ref Range Status   C Diff antigen POSITIVE (A) NEGATIVE Final   C Diff toxin POSITIVE (A) NEGATIVE Final   C Diff interpretation Toxin producing C. difficile detected.  Final    Comment: CRITICAL RESULT CALLED TO, READ BACK BY AND VERIFIED WITH: W.[facility] RN AT [id number] 01/22/16 BY A.DAVIS      Time coordinating discharge: Over 30 minutes  SIGNED:   Birdie Hopes, MD  [facility] 01/26/2016, 11:19 AM Pager   If 7PM-7AM, please contact night-coverage [email or web address] Password [email or web address]

## 2016-01-26 NOTE — Progress Notes (Signed)
Report was called to [place] at [facility]. Patient is waiting for transportation

## 2016-01-27 ENCOUNTER — Encounter: Payer: Self-pay | Admitting: Internal Medicine

## 2016-01-27 ENCOUNTER — Non-Acute Institutional Stay (SKILLED_NURSING_FACILITY): Payer: Medicare Other | Admitting: Internal Medicine

## 2016-01-27 DIAGNOSIS — E785 Hyperlipidemia, unspecified: Secondary | ICD-10-CM | POA: Diagnosis not present

## 2016-01-27 DIAGNOSIS — A047 Enterocolitis due to Clostridium difficile: Secondary | ICD-10-CM | POA: Diagnosis not present

## 2016-01-27 DIAGNOSIS — K5900 Constipation, unspecified: Secondary | ICD-10-CM | POA: Diagnosis not present

## 2016-01-27 DIAGNOSIS — R5381 Other malaise: Secondary | ICD-10-CM

## 2016-01-27 DIAGNOSIS — C259 Malignant neoplasm of pancreas, unspecified: Secondary | ICD-10-CM

## 2016-01-27 DIAGNOSIS — N189 Chronic kidney disease, unspecified: Secondary | ICD-10-CM

## 2016-01-27 DIAGNOSIS — D631 Anemia in chronic kidney disease: Secondary | ICD-10-CM

## 2016-01-27 DIAGNOSIS — Z992 Dependence on renal dialysis: Secondary | ICD-10-CM

## 2016-01-27 DIAGNOSIS — N186 End stage renal disease: Secondary | ICD-10-CM

## 2016-01-27 DIAGNOSIS — E46 Unspecified protein-calorie malnutrition: Secondary | ICD-10-CM | POA: Diagnosis not present

## 2016-01-27 DIAGNOSIS — A0472 Enterocolitis due to Clostridium difficile, not specified as recurrent: Secondary | ICD-10-CM

## 2016-01-27 DIAGNOSIS — I953 Hypotension of hemodialysis: Secondary | ICD-10-CM

## 2016-01-27 DIAGNOSIS — M792 Neuralgia and neuritis, unspecified: Secondary | ICD-10-CM

## 2016-01-27 DIAGNOSIS — E871 Hypo-osmolality and hyponatremia: Secondary | ICD-10-CM | POA: Diagnosis not present

## 2016-01-27 NOTE — Progress Notes (Signed)
LOCATION: [name]  PCP: [name], MD   Code Status: Full Code  Goals of care: Advanced Directive information Advanced Directives 01/21/2016  Does patient have an advance directive? Yes  Type of Advance Directive [place]  Does patient want to make changes to advanced directive? No - Patient declined  Copy of advanced directive(s) in chart? No - copy requested  Would patient like information on creating an advanced directive? -  Pre-existing out of facility DNR order (yellow form or pink MOST form) -       Extended Emergency Contact Information Primary Emergency Contact: [facility] Address: [address]          [place], [place] [address] [name] of [place] Phone: [phone] Relation: Daughter Secondary Emergency Contact: [facility] Address: [address]          [place], [place] [address] [name] Mobile Phone: [phone] Relation: Son   Allergies  Allergen Reactions  . Benazepril Hcl Hives, Itching and Rash    flareups on patient head    Chief Complaint  Patient presents with  . Readmit To SNF    Readmission     HPI:  Patient is a 80 y.o. male seen today for long term care post hospital re-admission from 01/21/16-01/26/16 with concern for sepsis with c.diff colitis. He was started on po vancomycin. He has medical history of adenocarcinoma of head of pancreas, ESRD on HD, CAD, CHF among others. He is seen in his room today.   Review of Systems:  Constitutional: Negative for fever, chills and diaphoresis. Feels weak and tired. HENT: Negative for headache, congestion, nasal discharge. Positive for some difficulty swallowing solid and liquids.   Eyes: Negative for blurred vision, double vision and discharge.  Respiratory: Negative for cough, shortness of breath and wheezing.   Cardiovascular: Negative for chest pain, palpitations, leg swelling.  Gastrointestinal: Negative for heartburn,  nausea, vomiting, abdominal pain. Last bowel movement was today with regular consistency stool. Genitourinary: Negative for dysuria and flank pain.  Musculoskeletal: Negative for back pain, fall in the facility.  Skin: Negative for itching, rash.  Neurological: Positive for dizziness  Psychiatric/Behavioral: Negative for depression   Past Medical History:  Diagnosis Date  . Arthritis   . CAD (coronary artery disease)    a. s/p NSTEMI and failed RCA PCI in 2011. b. Nuc 12/2013: nonischemic, prior inferior MI.  . Calciphylaxis    a. Multiple admissions for nonhealing L ulcer/sepsis, s/p L AKA 02/02/2014.  Marland Kitchen CHF (congestive heart failure) ([place])   . Cholelithiasis 01/2014   acute cholecystitis managed with perc drain due to high surgical risk.   . Chronic anemia   . Dementia   . Depression with anxiety   . Dialysis patient [facility])    M, W, F; [facility] ESRD (end stage renal disease) on dialysis ([place])   . GERD (gastroesophageal reflux disease)   . Heart attack ([place]) 2011; 1990's  . History of left bundle branch block   . Hx of above knee amputation ([place])    left  . Hyperlipidemia   . Hyperparathyroidism   . Hypertension   . Hypotension    renovascular  . LBBB (left bundle branch block)   . Migraine    none recent  . Pancreatic mass   . Peripheral neuropathy ([place])   . Renal insufficiency   . Stroke [facility])    several, last cva 2015 or 2014  . Type II diabetes mellitus (  [place])    not taking any medications.   Past Surgical History:  Procedure Laterality Date  . AMPUTATION Left 02/02/2014   Procedure: AMPUTATION ABOVE KNEE-LEFT;  Surgeon: Elam Dutch, MD;  Location: [place];  Service: Vascular;  Laterality: Left;  . APPLICATION OF WOUND VAC Left 03/31/2014   Procedure: APPLICATION OF WOUND VAC - LEFT AKA;  Surgeon: Elam Dutch, MD;  Location: [place];  Service: Vascular;  Laterality: Left;  . CAPD INSERTION  09/12/2011   Procedure: LAPAROSCOPIC INSERTION CONTINUOUS  AMBULATORY PERITONEAL DIALYSIS  (CAPD) CATHETER;  Surgeon: Edward Jolly, MD;  Location: [place];  Service: General;  Laterality: N/A;  . CAPD REMOVAL  03/12/2012   Procedure: CONTINUOUS AMBULATORY PERITONEAL DIALYSIS  (CAPD) CATHETER REMOVAL;  Surgeon: Edward Jolly, MD;  Location: [place];  Service: General;  Laterality: N/A;  . COLON SURGERY  2011   colon resection   . COLONOSCOPY WITH PROPOFOL N/A 07/14/2014   Procedure: COLONOSCOPY WITH PROPOFOL;  Surgeon: Jerene Bears, MD;  Location: [facility];  Service: Gastroenterology;  Laterality: N/A;  . DG AV DIALYSIS SHUNT INTRO NDL*R* OR     "not working" (09/19/11)  . ESOPHAGOGASTRODUODENOSCOPY Left 11/28/2015   Procedure: ESOPHAGOGASTRODUODENOSCOPY (EGD);  Surgeon: Juanita Craver, MD;  Location: [facility];  Service: Endoscopy;  Laterality: Left;  . EUS N/A 01/06/2016   Procedure: UPPER ENDOSCOPIC ULTRASOUND (EUS) LINEAR;  Surgeon: Milus Banister, MD;  Location: [facility];  Service: Endoscopy;  Laterality: N/A;  . I&D EXTREMITY Left 03/31/2014   Procedure: IRRIGATION AND DEBRIDEMENT EXTREMITY - LEFT ABOVE KNEE AMPUTATION;  Surgeon: Elam Dutch, MD;  Location: [place];  Service: Vascular;  Laterality: Left;  . PARATHYROIDECTOMY  2011  . SP AV DIALYSIS SHUNT ACCESS EXISTING *L*  2003   Social History:   reports that he quit smoking about 39 years ago. His smoking use included Cigarettes. He has a 1.75 pack-year smoking history. He has never used smokeless tobacco. He reports that he does not drink alcohol or use drugs.  Family History  Problem Relation Age of Onset  . Cancer Sister     breast  . Cancer Brother     lung  . Anesthesia problems Neg Hx   . Hypotension Neg Hx   . Malignant hyperthermia Neg Hx   . Pseudochol deficiency Neg Hx     Medications:   Medication List       Accurate as of 01/27/16  3:23 PM. Always use your most recent med list.          acetaminophen 325 MG tablet Commonly known as:  TYLENOL Take  650 mg by mouth every 6 (six) hours as needed for mild pain, fever or headache (DO NOT EXCEED 4 GM OF APAP IN 24 HOURS FORM ALL SOURCES).   atorvastatin 10 MG tablet Commonly known as:  LIPITOR Take 10 mg by mouth at bedtime.   B-complex with vitamin C tablet Take 1 tablet by mouth daily.   docusate sodium 100 MG capsule Commonly known as:  COLACE Take 100 mg by mouth at bedtime.   feeding supplement (NEPRO CARB STEADY) Liqd Take 237 mLs by mouth 3 (three) times daily.   feeding supplement (PRO-STAT SUGAR FREE 64) Liqd Take 30 mLs by mouth 2 (two) times daily.   gabapentin 100 MG capsule Commonly known as:  NEURONTIN Take 100 mg by mouth at bedtime.   HYDROcodone-acetaminophen 10-325 MG tablet Commonly known as:  NORCO Take 1 tablet by mouth  every 4 (four) hours as needed (pain).   midodrine 5 MG tablet Commonly known as:  PROAMATINE Take 5 mg by mouth See admin instructions. Take 1 tablet (5 mg) by mouth 3 times daily on Sunday, Tuesday, Thursday, Saturday (7:30am, 2pm, 10pm); take 1 tablet (5 mg) by mouth 2 times daily on dialysis days - Monday, Wednesday, Friday (2pm and 10pm)   ondansetron 4 MG tablet Commonly known as:  ZOFRAN Take 4 mg by mouth every 8 (eight) hours as needed for nausea or vomiting.   promethazine 25 MG suppository Commonly known as:  PHENERGAN Place 25 mg rectally every 6 (six) hours as needed for nausea or vomiting.   sevelamer carbonate 800 MG tablet Commonly known as:  RENVELA Take 800 mg by mouth See admin instructions. Take 1 tablet (800 mg) by mouth daily with lunch and dinner (12pm -5pm)   sorbitol 70 % solution Take 30 mLs by mouth daily as needed (severe constipation).   vancomycin 125 MG capsule Commonly known as:  VANCOCIN HCL Take 1 capsule (125 mg total) by mouth 4 (four) times daily.       Immunizations: Immunization History  Administered Date(s) Administered  . Influenza-Unspecified 04/03/2014, 04/19/2015  . PPD Test  04/03/2014, 04/17/2014, 02/16/2015  . Pneumococcal Polysaccharide-23 02/08/2014  . Pneumococcal-Unspecified 04/03/2014     Physical Exam:  Vitals:   01/27/16 1011  BP: (!) 152/91  Pulse: 98  Resp: 16  Temp: 97.8 F (36.6 C)  TempSrc: Oral  SpO2: 97%  Weight: 130 lb 11.2 oz (59.3 kg)  Height: 5\' 5"  (1.651 m)   Body mass index is 21.75 kg/m.  General- elderly male, thin built, in no acute distress Head- normocephalic, atraumatic Nose- no nasal discharge Throat- moist mucus membrane, has dentures  Eyes- PERRLA, EOMI, no pallor, no icterus Neck- no cervical lymphadenopathy Cardiovascular- normal s1,s2, no murmur, trace right leg edema Respiratory- bilateral clear to auscultation, no wheeze, no rhonchi, no crackles, no use of accessory muscles Abdomen- bowel sounds present, soft, non tender, no guarding or rigidity Musculoskeletal- left AKA, able to move all 3 extremities Neurological- alert and oriented to person, place and time Skin- warm and dry, left arm AV fistula with good thrill, has calciphylaxis Psychiatry- normal mood and affect     Labs reviewed: Basic Metabolic Panel:  Recent Labs  11/08/15 0518  11/09/15 0547 11/10/15 0656  11/30/15 0458  01/22/16 1341 01/22/16 1751 01/24/16 1239 01/26/16 0715  NA 137  < > 138 138  < > 135  < > 133*  --  139 134*  K 4.8  < > 3.6 3.8  < > 3.4*  < > 3.5  --  3.8 3.5  CL 99*  < > 97* 98*  < > 102  < > 86*  --  90* 89*  CO2 24  < > 22 26  < > 21*  < > 27  --  30 31  GLUCOSE 91  < > 60* 89  < > 111*  < > 180*  --  124* 141*  BUN 36*  < > 19 28*  < > 26*  < > 49*  --  34* 48*  CREATININE 6.94*  < > 4.57* 6.41*  < > 5.54*  < > 9.18*  --  7.07* 8.60*  CALCIUM 7.8*  < > 7.7* 7.6*  < > 7.5*  < > 7.7*  --  8.5* 7.7*  MG 2.1  --  2.0 2.1  --   --   --   --   --   --   --  PHOS  --   < >  --   --   < > 1.7*  --   --  5.4*  --  3.3  < > = values in this interval not displayed. Liver Function Tests:  Recent Labs   11/29/15 0323  12/06/15 01/21/16 1342 01/22/16 1341 01/26/16 0715  AST 41  --  27 30 22   --   ALT 16*  --  9* 12* 12*  --   ALKPHOS 103  --  109 238* 185*  --   BILITOT 0.8  --   --  1.9* 1.5*  --   PROT 5.9*  --   --  8.0 7.2  --   ALBUMIN 2.4*  < >  --  2.7* 2.4* 1.9*  < > = values in this interval not displayed.  Recent Labs  11/08/15 1137 11/22/15 1955 01/21/16 1342  LIPASE 65* 34 48   No results for input(s): AMMONIA in the last 8760 hours. CBC:  Recent Labs  11/09/15 0547 11/10/15 0656 11/22/15 1955  01/21/16 1342 01/21/16 1356 01/22/16 1341 01/26/16 0715  WBC 4.1 2.8* 9.5  < > 11.8*  --  10.1 6.4  NEUTROABS 2.7 1.7 7.5  --   --   --   --   --   HGB 9.9* 9.5* 10.9*  < > 10.6* 12.9* 9.2* 8.9*  HCT 32.3* 30.8* 34.9*  < > 33.3* 38.0* 29.7* 28.8*  MCV 85.7 84.2 83.5  < > 83.7  --  81.8 82.1  PLT 86* 91* 119*  < > 197  --  234 207  < > = values in this interval not displayed. Cardiac Enzymes:  Recent Labs  01/21/16 2038 01/22/16 1341  TROPONINI 0.08* 0.07*   BNP: Invalid input(s): POCBNP CBG:  Recent Labs  11/30/15 1154 01/21/16 1320 01/21/16 1955  GLUCAP 185* 97 114*    Radiological Exams: Dg Chest 2 View  Result Date: 01/21/2016 CLINICAL DATA:  Pt has been having chest pains and SOB.Hx of CHF, DM, HTN, renal insufficiency, peripheral neuropathy, dialysis patient EXAM: CHEST  2 VIEW COMPARISON:  11/22/2015 FINDINGS: Cardiac silhouette is mildly enlarged. The aorta is uncoiled. No mediastinal or hilar masses or evidence of adenopathy. Elevation of left hemidiaphragm. There is atelectasis at the left lung base and lateral to the left hilum. No convincing pneumonia. No evidence of pulmonary edema. No pleural effusion or pneumothorax. Bony thorax is demineralized but grossly intact. IMPRESSION: No acute cardiopulmonary disease. Electronically Signed   By: Lajean Manes M.D.   On: 01/21/2016 14:54   Ct Abdomen Pelvis W Contrast  Result Date:  01/21/2016 CLINICAL DATA:  Patient has been vomiting for about a week, and generalize weakness. He has been on dialysis for 15 years EXAM: CT ABDOMEN AND PELVIS WITH CONTRAST TECHNIQUE: Multidetector CT imaging of the abdomen and pelvis was performed using the standard protocol following bolus administration of intravenous contrast. CONTRAST:  25mL ISOVUE-300 IOPAMIDOL (ISOVUE-300) INJECTION 61% COMPARISON:  11/22/2015 FINDINGS: Lung bases: Heart is mildly enlarged. There are coronary artery calcifications. Multiple calcified nodes are noted along the hila. Patchy areas of opacity are noted in the lungs. There is a 2.6 x 2.5 cm focal opacity in the left lower lobe. There is a 7 mm focal opacity in the posterior left lower lobe, image 2, series 3. Patchy areas of focal opacity noted in the right lower lobe. There are minimal pleural effusions. Pancreas: Ill-defined mass extends throughout most of the pancreatic head, neck and  body and uncinate process, surrounding the superior mesenteric artery and celiac artery and branch vessels. Mass abuts and may invade the anterior aspect of the aorta. Mass abuts the posterior margin of the stomach. It measures 4.4 cm in thickness at this level, but also extends along the gastrohepatic ligament to contact the pylorus and first and second portions of the duodenum. There are adjacent prominent to mildly enlarged lymph nodes along the gastrohepatic ligament and peripancreatic and periceliac chains. The tail the pancreas is atrophic with cystic dilation of the duct. 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 The stomach is significantly distended, which may be due to a gastroparesis or partial mechanical obstruction, due to the pancreatic mass. The ligament of Treitz region of the bowel is in close proximity to the mass and may be involved. Hepatobiliary: No liver mass or focal lesion. Gallbladder is  distended. Small amount of sludge is evident dependently. No evidence acute cholecystitis. No bile duct dilation. Spleen: Mildly enlarged measuring 13.5 cm. No mass or focal lesion. Adrenal glands:  No masses. Kidneys, ureters, bladder: Marked renal atrophy with numerous bilateral renal cysts consistent with acquired renal cystic disease. No hydronephrosis. Bladder is decompressed. Vascular: Extensive arterial vascular calcifications noted along the aorta and throughout all branch vessels. Ascites: Trace amount of ascites seen adjacent to the liver and spleen. Prostate gland:  Enlarged and heterogeneous. Gastrointestinal: Distention of the distal esophagus with mild wall thickening. No evidence of small bowel or colonic obstruction. No wall thickening or inflammatory changes. There are prominent vessels throughout the mesenteric. Abdominal wall:  Midline scarring.  Diffuse subcutaneous edema. Musculoskeletal: Findings of renal osteodystrophy. Numerous Schmorl's nodes throughout the visualized spine. Large cysts along the acetabula. IMPRESSION: 1. As noted on the prior CT, an ill-defined mass is centered on the pancreas involving the head, neck, body and uncinate process. Mass surrounds the celiac axis and superior mesenteric vessels, abuts and possibly invades the anterior aspect of the abdominal aorta and abuts the posterior margin of the stomach. It extends to abut the first and second portions of the duodenum. 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 This is highly suspicious for infiltrative pancreatic carcinoma. 2. Stomach is distended. This suggests either partial functional obstruction of the stomach or possibly mechanical obstruction in the region of the ligament of Treitz. 3. Trace amount of ascites. No definitive peritoneal carcinomatosis, although this is not excluded. 4. Lung base opacities that may be due to atelectasis or infection or a combination. Neoplastic/  metastatic disease is possible. Small pleural effusions. 5. Multiple chronic findings including end-stage renal disease and bony changes of renal osteodystrophy. Electronically Signed   By: Lajean Manes M.D.   On: 01/21/2016 15:48    Assessment/Plan  Physical deconditioning Will have him work with physical therapy and occupational therapy team to help with gait training and muscle strengthening exercises.fall precautions. Skin care. Encourage to be out of bed.   C.diff colitis No loose stool today. On po vancomycin 125 mg qid until 02/05/16. Monitor his bowel movement. Hydration to be maintained. On contact precautions.   Adenocarcinoma of head of pancreas Not a surgical candidate, pending follow up with oncology. Continue supportive care for now with hospice consult. Continue norco 10-325 mg q4h prn pain  Anemia of chronic disease Monitor cbc  ESRD on HD Continue HD 3 days a week. Continue sevelamer  Hyponatremia Monitor bmp  Protein calorie malnutrition Monitor po intake,  continue prostat and feeding supplement  Hypotension With HD, continue midodrine 5 mg tid 4 days a week and bid on 3 days of week. No changes made, monitor VS  Constipation With his c.diff, d/c colace for now and monitor  Neuropathic pain Continue gabapentin 100 mg qhs  HLD Continue atorvastatin   Goals of care: long term care   Labs/tests ordered: cbc, cmp 1 week  Family/ staff Communication: reviewed care plan with patient and nursing supervisor    [name], MD Internal Medicine [place], [place] [address] Cell Phone (Monday-Friday 8 am - 5 pm): [phone] On Call: [phone] and follow prompts after 5 pm and on weekends Office Phone: [phone] Office Fax: [phone]

## 2016-02-03 ENCOUNTER — Encounter: Payer: Self-pay | Admitting: Family

## 2016-02-03 ENCOUNTER — Non-Acute Institutional Stay (SKILLED_NURSING_FACILITY): Payer: Medicare Other | Admitting: Family

## 2016-02-03 DIAGNOSIS — R112 Nausea with vomiting, unspecified: Secondary | ICD-10-CM | POA: Diagnosis not present

## 2016-02-03 DIAGNOSIS — Z7189 Other specified counseling: Secondary | ICD-10-CM | POA: Diagnosis not present

## 2016-02-03 DIAGNOSIS — R630 Anorexia: Secondary | ICD-10-CM | POA: Diagnosis not present

## 2016-02-03 NOTE — Progress Notes (Signed)
Location:   [facility] Room Number: [id number] of Service:  SNF (31) Provider:  Morayma Godown,FNP-C   [name], MD  Patient Care Team: [name], MD as PCP - General (Internal Medicine) Donato Heinz, MD as Consulting Physician (Nephrology) Ladell Pier, MD as Consulting Physician (Oncology)  Extended Emergency Contact Information Primary Emergency Contact: [facility] Address: [address]          [place], [place] [address] [name] of [place] Phone: [phone] Relation: Daughter Secondary Emergency Contact: [facility] Address: [address]          [place], [place] [address] [name] Mobile Phone: [phone] Relation: Son  Code Status:  DNR Goals of care: Advanced Directive information Advanced Directives 02/24/2016  Does Patient Have a Medical Advance Directive? Yes  Type of Advance Directive Out of facility DNR (pink MOST or yellow form)  Does patient want to make changes to medical advance directive? No - Patient declined  Copy of [place] in Chart? Yes  Would patient like information on creating a medical advance directive? -  Pre-existing out of facility DNR order (yellow form or pink MOST form) -     Chief Complaint  Patient presents with  . Acute Visit    Nausea    HPI:  Pt is a 80 y.o. male seen today at [facility] for an acute visit for evaluation of nausea and vomiting. He has a recent diagnosis of adenocarcinoma of head of pancreas.He has a medical history of CHF, CAD,ESRD on Hemodialysis. He is seen in his room today. He complains of nausea and vomiting unable to tolerated oral intake. He has been refusing to go on his scheduled days of dialysis states wants to give up. His code status is full code.He is awaiting evaluation with Hospice service. Code status discussed with patient in the presence of the facility Nurse supervisor.  Patient decided to change from full code to Do not resuscitate due to recent diagnosis of adenocarcinoma of head of pancreas and refusal to go for further dialysis.   Past Medical History:  Diagnosis Date  . Arthritis   . CAD (coronary artery disease)    a. s/p NSTEMI and failed RCA PCI in 2011. b. Nuc 12/2013: nonischemic, prior inferior MI.  . Calciphylaxis    a. Multiple admissions for nonhealing L ulcer/sepsis, s/p L AKA 02/02/2014.  Marland Kitchen CHF (congestive heart failure) ([place])   . Cholelithiasis 01/2014   acute cholecystitis managed with perc drain due to high surgical risk.   . Chronic anemia   . Dementia   . Depression with anxiety   . Dialysis patient [facility])    M, W, F; [place] ESRD (end stage renal disease) on dialysis ([place])   . GERD (gastroesophageal reflux disease)   . Heart attack 2011; 1990's  . History of left bundle branch block   . Hx of above knee amputation ([place])    left  . Hyperlipidemia   . Hyperparathyroidism   . Hypertension   . Hypotension    renovascular  . LBBB (left bundle branch block)   . Migraine    none recent  . Pancreatic mass   . Peripheral neuropathy ([place])   . Renal insufficiency   . Stroke [facility])    several, last cva 2015 or 2014  . Type II diabetes mellitus ([place])    not taking any medications.   Past Surgical  History:  Procedure Laterality Date  . AMPUTATION Left 02/02/2014   Procedure: AMPUTATION ABOVE KNEE-LEFT;  Surgeon: Elam Dutch, MD;  Location: [facility];  Service: Vascular;  Laterality: Left;  . APPLICATION OF WOUND VAC Left 03/31/2014   Procedure: APPLICATION OF WOUND VAC - LEFT AKA;  Surgeon: Elam Dutch, MD;  Location: [facility];  Service: Vascular;  Laterality: Left;  . CAPD INSERTION  09/12/2011   Procedure: LAPAROSCOPIC INSERTION CONTINUOUS AMBULATORY PERITONEAL DIALYSIS  (CAPD) CATHETER;  Surgeon: Edward Jolly, MD;  Location: [facility];  Service: General;  Laterality: N/A;  . CAPD REMOVAL  03/12/2012   Procedure:  CONTINUOUS AMBULATORY PERITONEAL DIALYSIS  (CAPD) CATHETER REMOVAL;  Surgeon: Edward Jolly, MD;  Location: [facility];  Service: General;  Laterality: N/A;  . COLON SURGERY  2011   colon resection   . COLONOSCOPY WITH PROPOFOL N/A 07/14/2014   Procedure: COLONOSCOPY WITH PROPOFOL;  Surgeon: Jerene Bears, MD;  Location: [facility];  Service: Gastroenterology;  Laterality: N/A;  . DG AV DIALYSIS SHUNT INTRO NDL*R* OR     "not working" (09/19/11)  . ESOPHAGOGASTRODUODENOSCOPY Left 11/28/2015   Procedure: ESOPHAGOGASTRODUODENOSCOPY (EGD);  Surgeon: Juanita Craver, MD;  Location: [facility];  Service: Endoscopy;  Laterality: Left;  . EUS N/A 01/06/2016   Procedure: UPPER ENDOSCOPIC ULTRASOUND (EUS) LINEAR;  Surgeon: Milus Banister, MD;  Location: [facility];  Service: Endoscopy;  Laterality: N/A;  . I&D EXTREMITY Left 03/31/2014   Procedure: IRRIGATION AND DEBRIDEMENT EXTREMITY - LEFT ABOVE KNEE AMPUTATION;  Surgeon: Elam Dutch, MD;  Location: [facility];  Service: Vascular;  Laterality: Left;  . PARATHYROIDECTOMY  2011  . SP AV DIALYSIS SHUNT ACCESS EXISTING *L*  2003    Allergies  Allergen Reactions  . Benazepril Hcl Hives, Itching and Rash    flareups on patient head    Allergies as of 02/03/2016      Reactions   Benazepril Hcl Hives, Itching, Rash   flareups on patient head      Medication List       Accurate as of 02/03/16 11:59 PM. Always use your most recent med list.          acetaminophen 325 MG tablet Commonly known as:  TYLENOL Take 650 mg by mouth every 6 (six) hours as needed for mild pain, fever or headache (DO NOT EXCEED 4 GM OF APAP IN 24 HOURS FORM ALL SOURCES).   atorvastatin 10 MG tablet Commonly known as:  LIPITOR Take 10 mg by mouth at bedtime.   B-complex with vitamin C tablet Take 1 tablet by mouth daily.   docusate sodium 100 MG capsule Commonly known as:  COLACE Take 100 mg by mouth at bedtime.   feeding supplement (NEPRO CARB STEADY) Liqd Take  237 mLs by mouth 3 (three) times daily.   feeding supplement (PRO-STAT SUGAR FREE 64) Liqd Take 30 mLs by mouth 2 (two) times daily.   gabapentin 100 MG capsule Commonly known as:  NEURONTIN Take 100 mg by mouth at bedtime.   HYDROcodone-acetaminophen 10-325 MG tablet Commonly known as:  NORCO Take 1 tablet by mouth every 4 (four) hours as needed (pain).   midodrine 5 MG tablet Commonly known as:  PROAMATINE Take 5 mg by mouth See admin instructions. Take 1 tablet (5 mg) by mouth 3 times daily on Sunday, Tuesday, Thursday, Saturday (7:30am, 2pm, 10pm); take 1 tablet (5 mg) by mouth 2 times daily on dialysis days - Monday, Wednesday, Friday (2pm and 10pm)  ondansetron 4 MG tablet Commonly known as:  ZOFRAN Take 4 mg by mouth every 8 (eight) hours.   promethazine 25 MG suppository Commonly known as:  PHENERGAN Place 25 mg rectally every 6 (six) hours as needed for nausea or vomiting.   sevelamer carbonate 800 MG tablet Commonly known as:  RENVELA Take 800 mg by mouth See admin instructions. Take 1 tablet (800 mg) by mouth daily with lunch and dinner (12pm -5pm)   sorbitol 70 % solution Take 30 mLs by mouth daily as needed (severe constipation).       Review of Systems  Constitutional: Positive for appetite change and fatigue. Negative for activity change, chills and fever.  HENT: Negative for congestion, rhinorrhea, sinus pressure, sneezing and sore throat.   Eyes: Negative.   Respiratory: Negative for cough, chest tightness, shortness of breath and wheezing.   Cardiovascular: Negative for chest pain and palpitations.  Gastrointestinal: Positive for nausea and vomiting. Negative for abdominal distention, constipation and diarrhea.  Endocrine: Negative for cold intolerance, heat intolerance, polydipsia, polyphagia and polyuria.  Genitourinary:       Usual Hemodialysis days on Mon, Wed and Friday. He continues to refuse dialysis.   Musculoskeletal: Positive for gait problem.        Left BKA   Skin: Negative for color change, pallor and rash.  Neurological: Positive for headaches. Negative for dizziness, seizures and syncope.  Hematological: Does not bruise/bleed easily.  Psychiatric/Behavioral: Negative for agitation, confusion, hallucinations and sleep disturbance. The patient is not nervous/anxious.     Immunization History  Administered Date(s) Administered  . Influenza-Unspecified 04/03/2014, 04/19/2015  . PPD Test 04/03/2014, 04/17/2014, 02/16/2015  . Pneumococcal Polysaccharide-23 02/08/2014  . Pneumococcal-Unspecified 04/03/2014   Pertinent  Health Maintenance Due  Topic Date Due  . OPHTHALMOLOGY EXAM  06/12/2016 (Originally 09/06/1945)  . URINE MICROALBUMIN  06/12/2016 (Originally 09/06/1945)  . PNA vac Low Risk Adult (2 of 2 - PCV13) 08/09/2016 (Originally 04/04/2015)  . INFLUENZA VACCINE  06/12/2017 (Originally 01/11/2016)  . HEMOGLOBIN A1C  06/12/2017 (Originally 01/04/2016)  . FOOT EXAM  08/18/2016      Vitals:   02/03/16 1520  BP: 112/60  Pulse: 88  Resp: 20  Temp: 98.8 F (37.1 C)  TempSrc: Oral  SpO2: 98%  Weight: 130 lb (59 kg)  Height: 5\' 5"  (1.651 m)   Body mass index is 21.63 kg/m. Physical Exam  Labs reviewed:  Recent Labs  11/08/15 0518  11/09/15 0547 11/10/15 0656  11/30/15 0458  01/22/16 1341 01/22/16 1751 01/24/16 1239 01/26/16 0715 02/12/16  NA 137  < > 138 138  < > 135  < > 133*  --  139 134* 135*  K 4.8  < > 3.6 3.8  < > 3.4*  < > 3.5  --  3.8 3.5 4.5  CL 99*  < > 97* 98*  < > 102  < > 86*  --  90* 89*  --   CO2 24  < > 22 26  < > 21*  < > 27  --  30 31  --   GLUCOSE 91  < > 60* 89  < > 111*  < > 180*  --  124* 141*  --   BUN 36*  < > 19 28*  < > 26*  < > 49*  --  34* 48* 18  CREATININE 6.94*  < > 4.57* 6.41*  < > 5.54*  < > 9.18*  --  7.07* 8.60* 4.5*  CALCIUM 7.8*  < >  7.7* 7.6*  < > 7.5*  < > 7.7*  --  8.5* 7.7*  --   MG 2.1  --  2.0 2.1  --   --   --   --   --   --   --   --   PHOS  --   < >  --   --    < > 1.7*  --   --  5.4*  --  3.3  --   < > = values in this interval not displayed.  Recent Labs  11/29/15 0323  01/21/16 1342 01/22/16 1341 01/26/16 0715 02/12/16  AST 41  < > 30 22  --  21  ALT 16*  < > 12* 12*  --  8*  ALKPHOS 103  < > 238* 185*  --  186*  BILITOT 0.8  --  1.9* 1.5*  --   --   PROT 5.9*  --  8.0 7.2  --   --   ALBUMIN 2.4*  < > 2.7* 2.4* 1.9*  --   < > = values in this interval not displayed.  Recent Labs  11/09/15 0547 11/10/15 0656 11/22/15 1955  01/21/16 1342  01/22/16 1341 01/26/16 0715 02/12/16  WBC 4.1 2.8* 9.5  < > 11.8*  --  10.1 6.4 7.9  NEUTROABS 2.7 1.7 7.5  --   --   --   --   --   --   HGB 9.9* 9.5* 10.9*  < > 10.6*  < > 9.2* 8.9* 9.9*  HCT 32.3* 30.8* 34.9*  < > 33.3*  < > 29.7* 28.8* 32*  MCV 85.7 84.2 83.5  < > 83.7  --  81.8 82.1  --   PLT 86* 91* 119*  < > 197  --  234 207 238  < > = values in this interval not displayed. Lab Results  Component Value Date   TSH 1.050 12/21/2013   Lab Results  Component Value Date   HGBA1C 6.6 07/07/2015   Lab Results  Component Value Date   CHOL 88 12/06/2015   HDL 34 (A) 12/06/2015   LDLCALC 45 12/06/2015   TRIG 48 12/06/2015   CHOLHDL 4.7 09/20/2011   Assessment/Plan Nausea and vomiting  Continue on Zofran 4 mg tablet every 8 hours offer nausea medication prior to meals. Continue Phenergan suppository or I.M as needed.  Poor appetite Possible due to nausea and vomiting. Will consult with RD for supplements.encourage small frequent meals and snacks.    DNR  Code status discussed with patient in the presence of the facility Nurse supervisor. Patient decided to change from full code to Do not resuscitate due to recent diagnosis of adenocarcinoma of head of pancreas and refusal to go for further dialysis. Continue on Hospice services.    Family/ staff Communication: Reviewed plan of care with patient and facility Nurse supervisor.   Labs/tests ordered: None

## 2016-02-09 ENCOUNTER — Other Ambulatory Visit: Payer: Self-pay | Admitting: Vascular Surgery

## 2016-02-09 DIAGNOSIS — N186 End stage renal disease: Secondary | ICD-10-CM

## 2016-02-09 DIAGNOSIS — T829XXD Unspecified complication of cardiac and vascular prosthetic device, implant and graft, subsequent encounter: Secondary | ICD-10-CM

## 2016-02-10 ENCOUNTER — Telehealth: Payer: Self-pay | Admitting: Oncology

## 2016-02-10 ENCOUNTER — Ambulatory Visit (HOSPITAL_BASED_OUTPATIENT_CLINIC_OR_DEPARTMENT_OTHER): Payer: Medicare Other | Admitting: Oncology

## 2016-02-10 VITALS — BP 83/60 | HR 91 | Temp 98.9°F | Resp 18 | Ht 65.0 in | Wt 121.4 lb

## 2016-02-10 DIAGNOSIS — R630 Anorexia: Secondary | ICD-10-CM | POA: Diagnosis not present

## 2016-02-10 DIAGNOSIS — R634 Abnormal weight loss: Secondary | ICD-10-CM | POA: Diagnosis not present

## 2016-02-10 DIAGNOSIS — D649 Anemia, unspecified: Secondary | ICD-10-CM | POA: Diagnosis not present

## 2016-02-10 DIAGNOSIS — C250 Malignant neoplasm of head of pancreas: Secondary | ICD-10-CM | POA: Diagnosis present

## 2016-02-10 DIAGNOSIS — N186 End stage renal disease: Secondary | ICD-10-CM | POA: Diagnosis not present

## 2016-02-10 DIAGNOSIS — Z992 Dependence on renal dialysis: Secondary | ICD-10-CM | POA: Diagnosis not present

## 2016-02-10 DIAGNOSIS — K269 Duodenal ulcer, unspecified as acute or chronic, without hemorrhage or perforation: Secondary | ICD-10-CM | POA: Diagnosis not present

## 2016-02-10 MED ORDER — PREDNISONE 10 MG PO TABS: 10.0000 mg | ORAL_TABLET | Freq: Every day | ORAL | Status: AC

## 2016-02-10 MED ORDER — PREDNISONE 10 MG (21) PO TBPK: 10.0000 mg | ORAL_TABLET | Freq: Every day | ORAL | Status: DC

## 2016-02-10 NOTE — Addendum Note (Signed)
Addended by: Brien Few on: 02/10/2016 03:44 PM   Modules accepted: Orders

## 2016-02-10 NOTE — Telephone Encounter (Signed)
CALLED PATIENT TO CONF APPT PER 02/10/16 LOS. NO V/M AVAILABLE. APPT LTR AND SCHD MAILED.

## 2016-02-10 NOTE — Progress Notes (Signed)
Referral faxed to [facility].

## 2016-02-10 NOTE — Progress Notes (Signed)
  [facility] OFFICE PROGRESS NOTE   Diagnosis: Pancreas cancer  INTERVAL HISTORY:   [name] returns as scheduled. He was admitted with C. difficile colitis on 01/21/2016. He completed outpatient course of vancomycin. He is currently taking hydrocodone for pain. He reports a poor appetite. The dialysis fistula is are no longer functioning and he is continuing dialysis via a right neck catheter.  He presents today with his daughter and granddaughter.  Objective:  Vital signs in last 24 hours:  Blood pressure (!) 83/60, pulse 91, temperature 98.9 F (37.2 C), temperature source Oral, resp. rate 18, height 5\' 5"  (1.651 m), weight 121 lb 6.4 oz (55.1 kg), SpO2 100 %.    Resp: Lungs clear bilaterally Cardio: Regular rate and rhythm GI: The abdomen is soft and nontender, no hepatosplenomegaly Vascular: Stasis change at the right lower leg Neuro: Alert and oriented   Gauze dressing at the right neck catheter site  Lab Results:  Lab Results  Component Value Date   WBC 6.4 01/26/2016   HGB 8.9 (L) 01/26/2016   HCT 28.8 (L) 01/26/2016   MCV 82.1 01/26/2016   PLT 207 01/26/2016   NEUTROABS 7.5 11/22/2015     Medications: I have reviewed the patient's current medications.  Assessment/Plan: 1. Pancreas cancer, adenocarcinoma, pancreas head mass, status post an EUS biopsy on 01/06/2016 ? EUS 01/06/2016 revealed a 3.57 m mass with involvement of the SMA and celiac trunk, suspicious iliac adenopathy ? MRI abdomen 11/27/2015 revealed a poorly defined head of the pancreas mass, enlarged peripancreatic lymph node 2. End-stage renal disease on hemodialysis  3.   Status post left AKA  4.   History of coronary artery disease  5.   Anemia  6.   Anorexia/weight loss secondary to #1  7.   History of C. difficile colitis  8.   Duodenal ulcer noted on endoscopy 11/28/2015-invasion of the duodenum by the pancreas mass?    Disposition:  [name] has  pancreas cancer. He has lost a significant amount of weight over the past month. He reports anorexia. He would like to try an appetite stamina. He will begin prednisone at a dose of 10 mg daily. We will ask the nursing facility to check his blood sugar daily and report a blood sugar greater than 200 to the nursing facility M.D.  He was admitted with C. difficile colitis earlier this month. He was not enrolled in the Hospice program. He agrees to a [facility] referral.  [name] would like to continue hemodialysis.  He will return for an office visit in one month. We are available to see him sooner as needed.  Betsy Coder, MD  02/10/2016  10:00 AM

## 2016-02-11 ENCOUNTER — Telehealth: Payer: Self-pay | Admitting: Nurse Practitioner

## 2016-02-11 NOTE — Telephone Encounter (Signed)
[facility] CALL FROM [facility] [phone] XT 122, TO CONF APPT DATE AND TIME. 02/11/16

## 2016-02-12 LAB — BASIC METABOLIC PANEL
BUN: 18 mg/dL (ref 4–21)
Creatinine: 4.5 mg/dL — AB (ref 0.6–1.3)
Glucose: 92 mg/dL
Potassium: 4.5 mmol/L (ref 3.4–5.3)
SODIUM: 135 mmol/L — AB (ref 137–147)

## 2016-02-12 LAB — CBC AND DIFFERENTIAL
HEMATOCRIT: 32 % — AB (ref 41–53)
HEMOGLOBIN: 9.9 g/dL — AB (ref 13.5–17.5)
PLATELETS: 238 10*3/uL (ref 150–399)
WBC: 7.9 10^3/mL

## 2016-02-12 LAB — HEPATIC FUNCTION PANEL
ALK PHOS: 186 U/L — AB (ref 25–125)
ALT: 8 U/L — AB (ref 10–40)
AST: 21 U/L (ref 14–40)
BILIRUBIN, TOTAL: 0.8 mg/dL

## 2016-02-24 ENCOUNTER — Non-Acute Institutional Stay (SKILLED_NURSING_FACILITY): Payer: Medicare Other | Admitting: Family

## 2016-02-24 ENCOUNTER — Encounter: Payer: Self-pay | Admitting: Family

## 2016-02-24 DIAGNOSIS — K869 Disease of pancreas, unspecified: Secondary | ICD-10-CM | POA: Diagnosis not present

## 2016-02-24 DIAGNOSIS — I5042 Chronic combined systolic (congestive) and diastolic (congestive) heart failure: Secondary | ICD-10-CM

## 2016-02-24 DIAGNOSIS — Z992 Dependence on renal dialysis: Secondary | ICD-10-CM

## 2016-02-24 DIAGNOSIS — K8689 Other specified diseases of pancreas: Secondary | ICD-10-CM

## 2016-02-24 DIAGNOSIS — M792 Neuralgia and neuritis, unspecified: Secondary | ICD-10-CM

## 2016-02-24 DIAGNOSIS — R630 Anorexia: Secondary | ICD-10-CM

## 2016-02-24 DIAGNOSIS — N186 End stage renal disease: Secondary | ICD-10-CM | POA: Diagnosis not present

## 2016-02-24 NOTE — Progress Notes (Signed)
Patient ID: Sean [name], male   DOB: [birth year], 80 y.o.   MRN: [id number] .  Location:   [facility] Room Number: [id number] of Service:  SNF (31) Provider:  Sandrea Hughs, FNP-C  [name], MD  Patient Care Team: [name], MD as PCP - General (Internal Medicine) Donato Heinz, MD as Consulting Physician (Nephrology) Ladell Pier, MD as Consulting Physician (Oncology)  Extended Emergency Contact Information Primary Emergency Contact: [facility] Address: [address]          [place], [place] [address] [name] of [place] Phone: [phone] Relation: Daughter Secondary Emergency Contact: [facility] Address: [address]          [place], [place] [address] [name] Mobile Phone: [phone] Relation: Son  Code Status: DNR  Goals of care: Advanced Directive information Advanced Directives 02/24/2016  Does patient have an advance directive? Yes  Type of Advance Directive Out of facility DNR (pink MOST or yellow form)  Does patient want to make changes to advanced directive? No - Patient declined  Copy of advanced directive(s) in chart? Yes  Would patient like information on creating an advanced directive? -  Pre-existing out of facility DNR order (yellow form or pink MOST form) -     Chief Complaint  Patient presents with  . Medical Management of Chronic Issues    Routine Visit    HPI:  Pt is a 80 y.o. male seen today at [facility]  for medical management of chronic diseases. He has a significant medical history of ESRD on dialysis, CHF, DM 2, Pancreatic Cancer among others. He is seen in his room today lying in bed. He states has been tired more often unable to go to dialysis. He complains of loss of appetite. " food does not taste the way it used to" but his son has been bring him food from home at times but still has no appetite. He requests for appetite  stimulant.He also request increase in Gabapentin due to Neuropathy keeping him awake at night. He denies any fever, chills, cough, nausea or vomiting. Facility. Facility staff reports patient has been refusing dialysis at times,meds and weight check. Patient states feels tired to get out of the bed since he is not eating much.   Past Medical History:  Diagnosis Date  . Arthritis   . CAD (coronary artery disease)    a. s/p NSTEMI and failed RCA PCI in 2011. b. Nuc 12/2013: nonischemic, prior inferior MI.  . Calciphylaxis    a. Multiple admissions for nonhealing L ulcer/sepsis, s/p L AKA 02/02/2014.  Marland Kitchen CHF (congestive heart failure) ([place])   . Cholelithiasis 01/2014   acute cholecystitis managed with perc drain due to high surgical risk.   . Chronic anemia   . Dementia   . Depression with anxiety   . Dialysis patient [facility])    M, W, F; [place] ESRD (end stage renal disease) on dialysis ([place])   . GERD (gastroesophageal reflux disease)   . Heart attack ([place]) 2011; 1990's  . History of left bundle branch block   . Hx of above knee amputation ([place])    left  . Hyperlipidemia   . Hyperparathyroidism   . Hypertension   . Hypotension    renovascular  . LBBB (left bundle branch block)   . Migraine    none recent  . Pancreatic mass   . Peripheral neuropathy ([place])   .  Renal insufficiency   . Stroke [facility])    several, last cva 2015 or 2014  . Type II diabetes mellitus ([place])    not taking any medications.   Past Surgical History:  Procedure Laterality Date  . AMPUTATION Left 02/02/2014   Procedure: AMPUTATION ABOVE KNEE-LEFT;  Surgeon: Elam Dutch, MD;  Location: [facility];  Service: Vascular;  Laterality: Left;  . APPLICATION OF WOUND VAC Left 03/31/2014   Procedure: APPLICATION OF WOUND VAC - LEFT AKA;  Surgeon: Elam Dutch, MD;  Location: [facility];  Service: Vascular;  Laterality: Left;  . CAPD INSERTION  09/12/2011   Procedure: LAPAROSCOPIC INSERTION CONTINUOUS AMBULATORY  PERITONEAL DIALYSIS  (CAPD) CATHETER;  Surgeon: Edward Jolly, MD;  Location: [facility];  Service: General;  Laterality: N/A;  . CAPD REMOVAL  03/12/2012   Procedure: CONTINUOUS AMBULATORY PERITONEAL DIALYSIS  (CAPD) CATHETER REMOVAL;  Surgeon: Edward Jolly, MD;  Location: [facility];  Service: General;  Laterality: N/A;  . COLON SURGERY  2011   colon resection   . COLONOSCOPY WITH PROPOFOL N/A 07/14/2014   Procedure: COLONOSCOPY WITH PROPOFOL;  Surgeon: Jerene Bears, MD;  Location: [facility];  Service: Gastroenterology;  Laterality: N/A;  . DG AV DIALYSIS SHUNT INTRO NDL*R* OR     "not working" (09/19/11)  . ESOPHAGOGASTRODUODENOSCOPY Left 11/28/2015   Procedure: ESOPHAGOGASTRODUODENOSCOPY (EGD);  Surgeon: Juanita Craver, MD;  Location: [facility];  Service: Endoscopy;  Laterality: Left;  . EUS N/A 01/06/2016   Procedure: UPPER ENDOSCOPIC ULTRASOUND (EUS) LINEAR;  Surgeon: Milus Banister, MD;  Location: [facility];  Service: Endoscopy;  Laterality: N/A;  . I&D EXTREMITY Left 03/31/2014   Procedure: IRRIGATION AND DEBRIDEMENT EXTREMITY - LEFT ABOVE KNEE AMPUTATION;  Surgeon: Elam Dutch, MD;  Location: [facility];  Service: Vascular;  Laterality: Left;  . PARATHYROIDECTOMY  2011  . SP AV DIALYSIS SHUNT ACCESS EXISTING *L*  2003    Allergies  Allergen Reactions  . Benazepril Hcl Hives, Itching and Rash    flareups on patient head      Medication List       Accurate as of 02/24/16 11:59 AM. Always use your most recent med list.          acetaminophen 325 MG tablet Commonly known as:  TYLENOL Take 650 mg by mouth every 6 (six) hours as needed for mild pain, fever or headache (DO NOT EXCEED 4 GM OF APAP IN 24 HOURS FORM ALL SOURCES).   atorvastatin 10 MG tablet Commonly known as:  LIPITOR Take 10 mg by mouth at bedtime.   B-complex with vitamin C tablet Take 1 tablet by mouth daily.   feeding supplement (NEPRO CARB STEADY) Liqd Take 237 mLs by mouth 3 (three) times daily.    feeding supplement (PRO-STAT SUGAR FREE 64) Liqd Take 30 mLs by mouth 2 (two) times daily.   gabapentin 100 MG capsule Commonly known as:  NEURONTIN Take 100 mg by mouth at bedtime.   HYDROcodone-acetaminophen 10-325 MG tablet Commonly known as:  NORCO Take 1 tablet by mouth every 4 (four) hours as needed (pain).   midodrine 5 MG tablet Commonly known as:  PROAMATINE Take 5 mg by mouth See admin instructions. Take 1 tablet (5 mg) by mouth 3 times daily on Sunday, Tuesday, Thursday, Saturday (7:30am, 2pm, 10pm); take 1 tablet (5 mg) by mouth 2 times daily on dialysis days - Monday, Wednesday, Friday (2pm and 10pm)   ondansetron 4 MG tablet Commonly known as:  ONEOK  Take 4 mg by mouth every 8 (eight) hours.   predniSONE 10 MG tablet Commonly known as:  DELTASONE Take 1 tablet (10 mg total) by mouth daily with breakfast.   promethazine 25 MG suppository Commonly known as:  PHENERGAN Place 25 mg rectally every 6 (six) hours as needed for nausea or vomiting.   sevelamer carbonate 800 MG tablet Commonly known as:  RENVELA Take 800 mg by mouth See admin instructions. Take 1 tablet (800 mg) by mouth daily with lunch and dinner (12pm -5pm)   sorbitol 70 % solution Take 30 mLs by mouth daily as needed (severe constipation).       Review of Systems  Constitutional: Positive for appetite change and fatigue. Negative for activity change, chills and fever.       Visible signs of weight loss though refuses weight check.   HENT: Negative for congestion, rhinorrhea, sinus pressure, sneezing and sore throat.   Eyes: Negative.   Respiratory: Negative for cough, chest tightness, shortness of breath and wheezing.   Cardiovascular: Negative for chest pain and palpitations.  Gastrointestinal: Negative for abdominal distention, constipation, diarrhea, nausea and vomiting.       Abdominal pain control by current regimen   Genitourinary:       Usual Hemodialysis days on Mon, Wed and Friday.  Has been refusing dialysis at times due to feeling tired.    Musculoskeletal: Positive for gait problem.       Left BKA   Skin: Negative for color change, pallor and rash.  Neurological: Negative for dizziness, seizures, syncope and headaches.       Numbness and tingling on the feet  Hematological: Does not bruise/bleed easily.  Psychiatric/Behavioral: Negative for agitation, confusion, hallucinations and sleep disturbance. The patient is not nervous/anxious.     Immunization History  Administered Date(s) Administered  . Influenza-Unspecified 04/03/2014, 04/19/2015  . PPD Test 04/03/2014, 04/17/2014, 02/16/2015  . Pneumococcal Polysaccharide-23 02/08/2014  . Pneumococcal-Unspecified 04/03/2014   Pertinent  Health Maintenance Due  Topic Date Due  . OPHTHALMOLOGY EXAM  06/12/2016 (Originally 09/06/1945)  . URINE MICROALBUMIN  06/12/2016 (Originally 09/06/1945)  . PNA vac Low Risk Adult (2 of 2 - PCV13) 08/09/2016 (Originally 04/04/2015)  . INFLUENZA VACCINE  06/12/2017 (Originally 01/11/2016)  . HEMOGLOBIN A1C  06/12/2017 (Originally 01/04/2016)  . FOOT EXAM  08/18/2016      Vitals:   02/24/16 1146  BP: 98/60  Pulse: 88  Temp: 97.6 F (36.4 C)  TempSrc: Oral  SpO2: 98%   There is no height or weight on file to calculate BMI. Physical Exam  Constitutional: He is oriented to person, place, and time. He appears well-developed and well-nourished. No distress.  Generalized weakness   HENT:  Head: Normocephalic.  Mouth/Throat: Oropharynx is clear and moist. No oropharyngeal exudate.  Eyes: Conjunctivae and EOM are normal. Pupils are equal, round, and reactive to light. Right eye exhibits no discharge. Left eye exhibits no discharge. No scleral icterus.  Neck: Normal range of motion. No JVD present. No thyromegaly present.  Cardiovascular: Normal rate, regular rhythm, normal heart sounds and intact distal pulses.  Exam reveals no gallop and no friction rub.   No murmur  heard. Pulmonary/Chest: Effort normal and breath sounds normal. No respiratory distress. He has no wheezes. He has no rales.  Abdominal: Soft. Bowel sounds are normal. He exhibits no distension. There is no rebound and no guarding.  Musculoskeletal: He exhibits no edema or tenderness.  Left BKA. Generalized weakness   Lymphadenopathy:  He has no cervical adenopathy.  Neurological: He is oriented to person, place, and time.  Skin: Skin is warm and dry. No rash noted. No erythema. No pallor.  Left AV fistula positive for thrill and bruit. Site without any signs of infections.   Psychiatric: He has a normal mood and affect.    Labs reviewed:  Recent Labs  11/08/15 0518  11/09/15 0547 11/10/15 0656  11/30/15 0458  01/22/16 1341 01/22/16 1751 01/24/16 1239 01/26/16 0715 02/12/16  NA 137  < > 138 138  < > 135  < > 133*  --  139 134* 135*  K 4.8  < > 3.6 3.8  < > 3.4*  < > 3.5  --  3.8 3.5 4.5  CL 99*  < > 97* 98*  < > 102  < > 86*  --  90* 89*  --   CO2 24  < > 22 26  < > 21*  < > 27  --  30 31  --   GLUCOSE 91  < > 60* 89  < > 111*  < > 180*  --  124* 141*  --   BUN 36*  < > 19 28*  < > 26*  < > 49*  --  34* 48* 18  CREATININE 6.94*  < > 4.57* 6.41*  < > 5.54*  < > 9.18*  --  7.07* 8.60* 4.5*  CALCIUM 7.8*  < > 7.7* 7.6*  < > 7.5*  < > 7.7*  --  8.5* 7.7*  --   MG 2.1  --  2.0 2.1  --   --   --   --   --   --   --   --   PHOS  --   < >  --   --   < > 1.7*  --   --  5.4*  --  3.3  --   < > = values in this interval not displayed.  Recent Labs  11/29/15 0323  01/21/16 1342 01/22/16 1341 01/26/16 0715 02/12/16  AST 41  < > 30 22  --  21  ALT 16*  < > 12* 12*  --  8*  ALKPHOS 103  < > 238* 185*  --  186*  BILITOT 0.8  --  1.9* 1.5*  --   --   PROT 5.9*  --  8.0 7.2  --   --   ALBUMIN 2.4*  < > 2.7* 2.4* 1.9*  --   < > = values in this interval not displayed.  Recent Labs  11/09/15 0547 11/10/15 0656 11/22/15 1955  01/21/16 1342  01/22/16 1341 01/26/16 0715 02/12/16   WBC 4.1 2.8* 9.5  < > 11.8*  --  10.1 6.4 7.9  NEUTROABS 2.7 1.7 7.5  --   --   --   --   --   --   HGB 9.9* 9.5* 10.9*  < > 10.6*  < > 9.2* 8.9* 9.9*  HCT 32.3* 30.8* 34.9*  < > 33.3*  < > 29.7* 28.8* 32*  MCV 85.7 84.2 83.5  < > 83.7  --  81.8 82.1  --   PLT 86* 91* 119*  < > 197  --  234 207 238  < > = values in this interval not displayed. Lab Results  Component Value Date   TSH 1.050 12/21/2013   Lab Results  Component Value Date   HGBA1C 6.6 07/07/2015   Lab Results  Component Value Date   CHOL 88 12/06/2015   HDL 34 (A) 12/06/2015   LDLCALC 45 12/06/2015   TRIG 48 12/06/2015   CHOLHDL 4.7 09/20/2011    Assessment/Plan Pancreatic Mass  Left abdominal pain controlled by current regimen. Denies nausea and vomiting. Has had visible weight loss though refuses weight checks. Recently discharge from Hospice service due to desire to continue on Hemodialysis.continue to monitor.  CHF Asymptomatic.   ESRD  Has been refusing Hemodialysis at times. Continue to monitor.   Neuropathic pain  Request medication adjustment.Pain affecting sleep at night. Increase Gabapentin to 100 mg Capsule three times daily.   Loss of Appetite Add Remeron 7.5 mg Tablet daily at bedtime. Continue to monitor.       Family/ staff Communication: Reviewed plan of care with patient and facility Nurse supervisor  Labs/tests ordered:  None

## 2016-02-25 ENCOUNTER — Encounter: Payer: Self-pay | Admitting: Vascular Surgery

## 2016-02-29 ENCOUNTER — Ambulatory Visit (HOSPITAL_COMMUNITY): Admission: RE | Admit: 2016-02-29 | Payer: Medicare Other | Source: Ambulatory Visit

## 2016-02-29 ENCOUNTER — Ambulatory Visit (HOSPITAL_COMMUNITY): Payer: Medicare Other

## 2016-03-01 ENCOUNTER — Telehealth: Payer: Self-pay | Admitting: *Deleted

## 2016-03-01 NOTE — Telephone Encounter (Signed)
Call from pt's daughter: her dad has chosen not to go to dialysis. Today marks week 2 that he has not gone to dialysis. She reports pt is "tired" and has lost a considerable amount of weight. Family is interested in getting hospice involved in his care since he is no longer interested in dialysis. [place] with the above information. They will contact [facility] to get an order from the covering physician.

## 2016-03-02 ENCOUNTER — Ambulatory Visit: Payer: Medicare Other | Admitting: Vascular Surgery

## 2016-03-09 ENCOUNTER — Ambulatory Visit: Payer: Medicare Other | Admitting: Nurse Practitioner

## 2016-03-12 DEATH — deceased

## 2016-05-14 IMAGING — CR DG CHEST 1V PORT
1 series · 1 of 1 positions shown · non-contrast
Comparison: 02/20/2012

CLINICAL DATA: Left-sided chest pressure

EXAM:
PORTABLE CHEST - 1 VIEW

[AP]
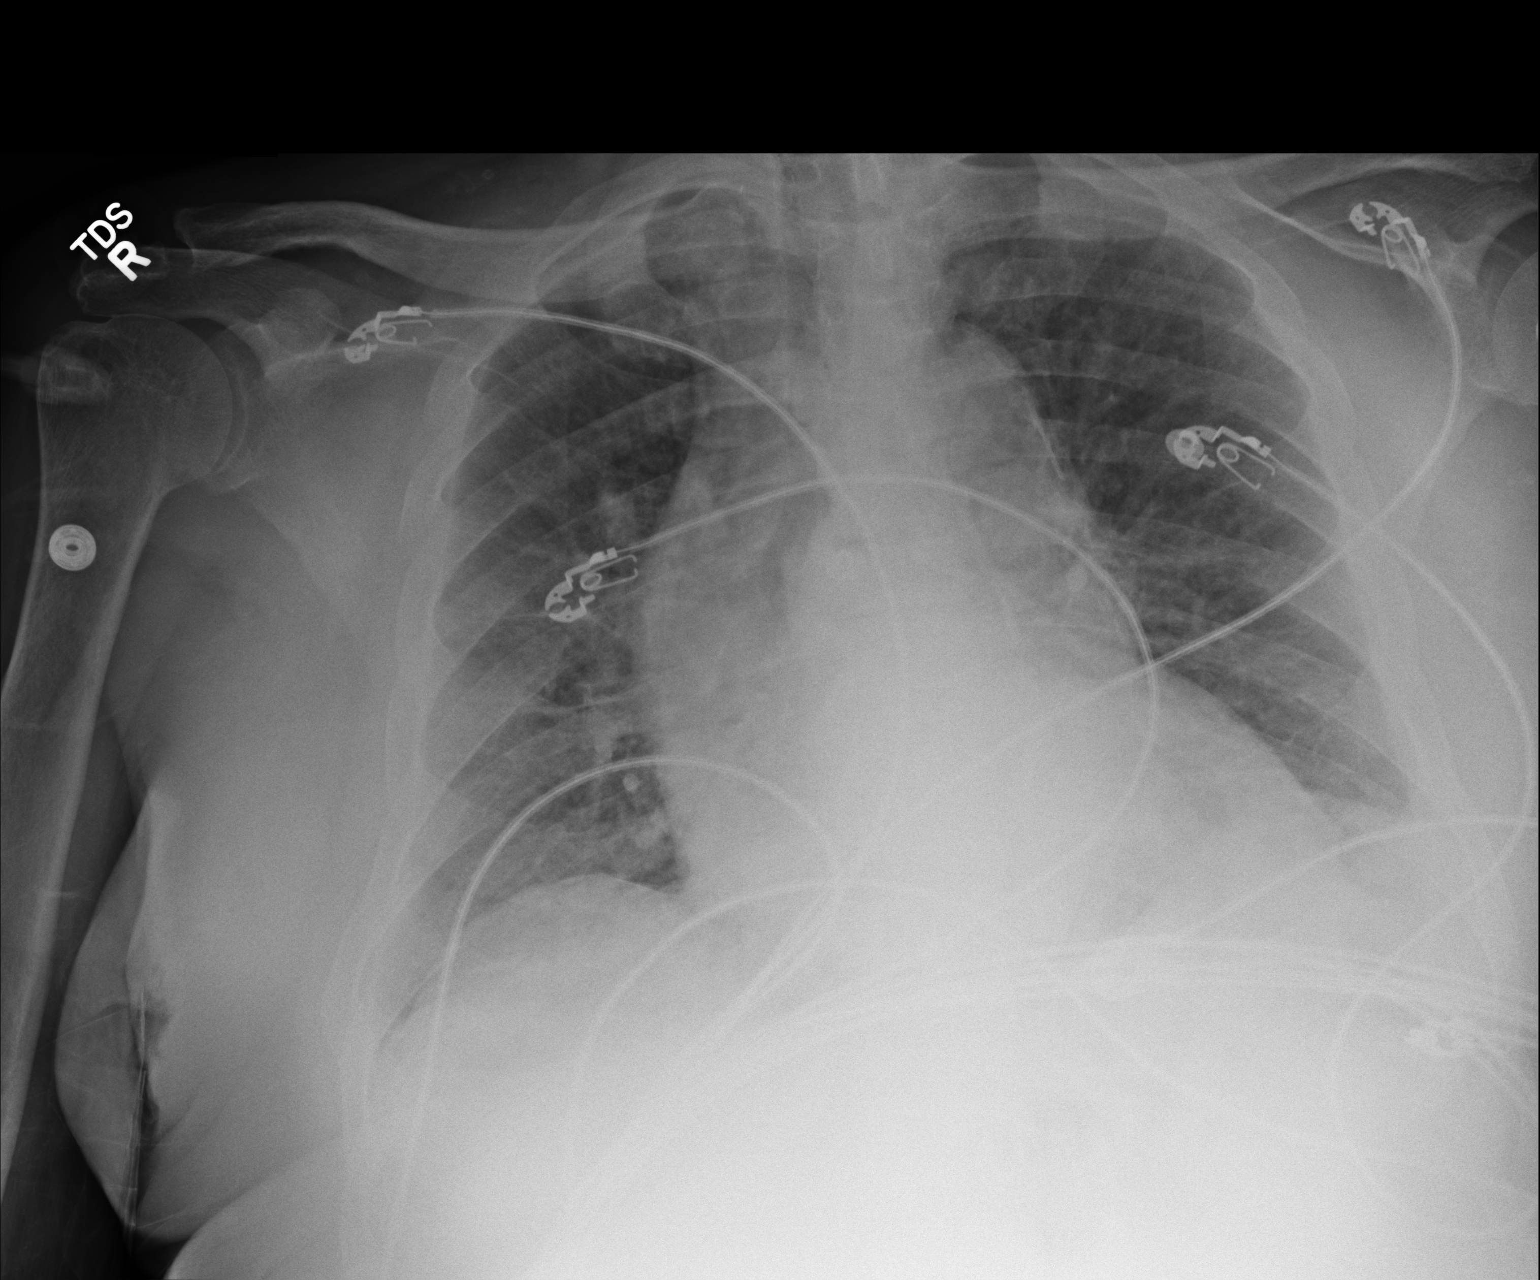

[1 of 1 positions shown; findings below may reference images not displayed]

FINDINGS: There is no focal parenchymal opacity, pleural effusion, or
pneumothorax. The heart and mediastinal contours are unremarkable.
There is thoracic aortic atherosclerosis.

There is mild cardiomegaly.

The osseous structures are unremarkable.
IMPRESSION: No active disease.

## 2016-05-15 IMAGING — CR DG CHEST 1V PORT
1 series · 1 of 1 positions shown · non-contrast
Comparison: 12/13/2013

CLINICAL DATA: Shortness of breath

EXAM:
PORTABLE CHEST - 1 VIEW

[AP]
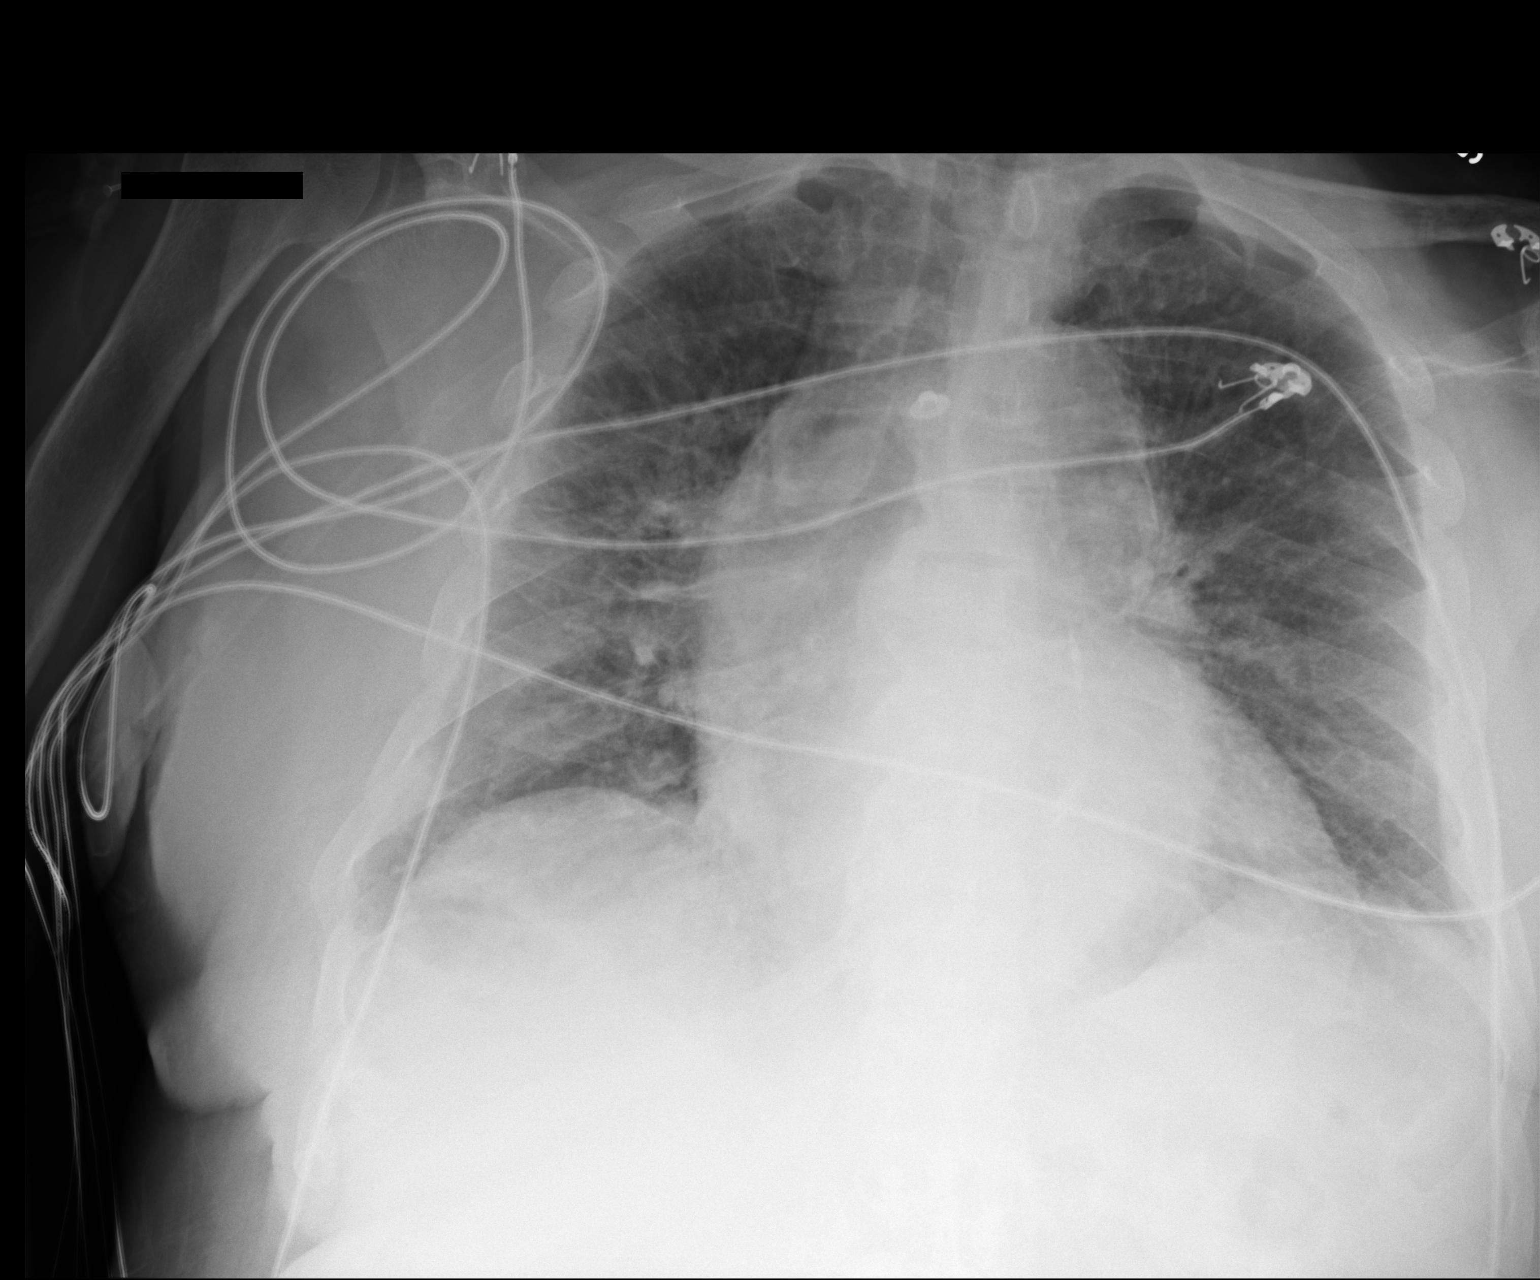

[1 of 1 positions shown; findings below may reference images not displayed]

FINDINGS: Cardiac shadow is stable. The lungs are well aerated bilaterally.
Mild interstitial changes are seen without focal confluent
infiltrate. No bony abnormality is noted.
IMPRESSION: No change from the prior exam.

## 2016-05-21 IMAGING — CR DG CHEST 2V
1 series · 1 of 1 positions shown · non-contrast
Comparison: 12/14/2013

CLINICAL DATA: LEFT lower leg wound infection for 2 weeks, history
dialysis, hypertension, diabetes, smoking, stroke

EXAM:
CHEST  2 VIEW

[w chest lat]
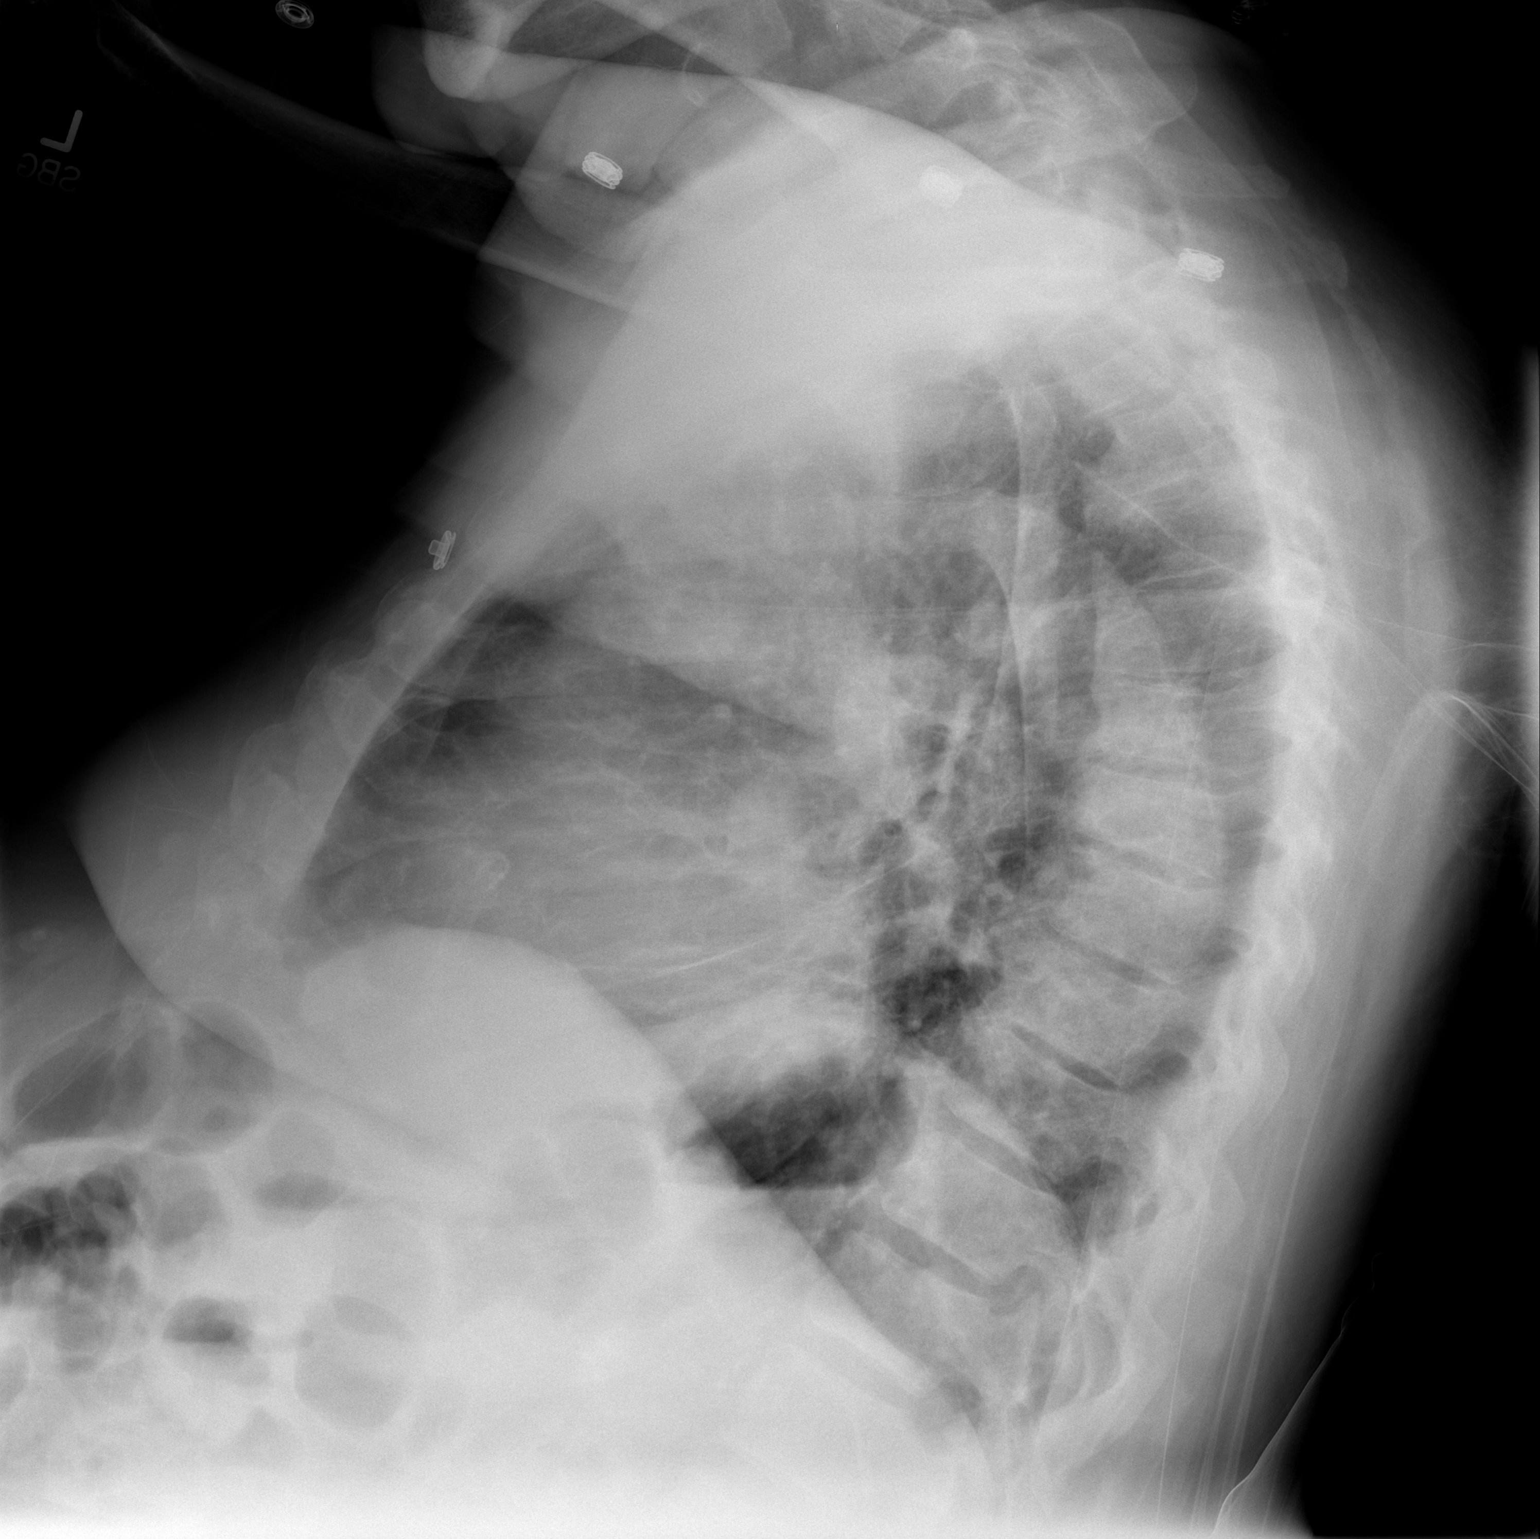

[1 of 1 positions shown; findings below may reference images not displayed]

FINDINGS: Enlargement of cardiac silhouette.

Tortuous aorta.

Mediastinal contours and pulmonary vascularity normal.

Eventration central LEFT diaphragm with LEFT basilar atelectasis.

No acute infiltrate, pleural effusion or pneumothorax.

No acute osseous findings.
IMPRESSION: LEFT basilar atelectasis.

## 2016-08-26 IMAGING — XA IR CHOLANGIOGRAM VIA EXIST CATHETER
1 series · 12 of 20 positions shown · non-contrast
Comparison: none

INDICATION: 78-year-old gentleman with a history of acute cholecystitis, treated
with percutaneous cholecystostomy tube Jumper 11/29/2013. The
patient is not a good operative candidate.

[Series 1: run · 12 of 20 slices shown]
[im 1/20]
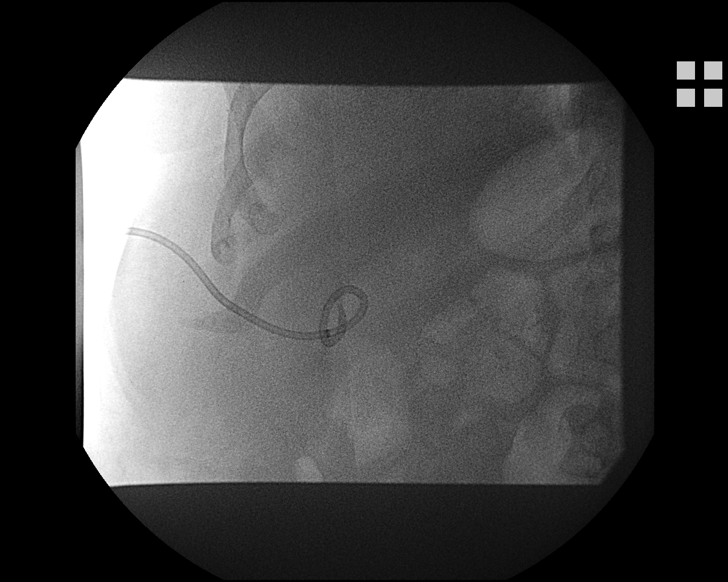
[im 3/20]
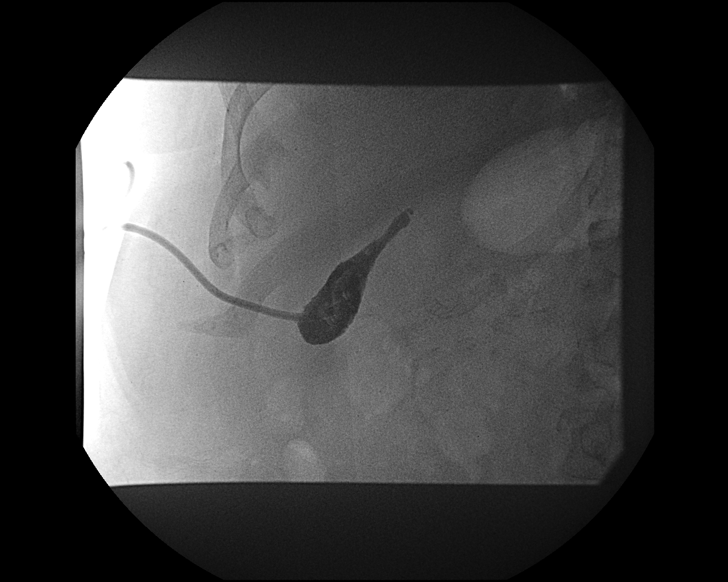
[im 5/20]
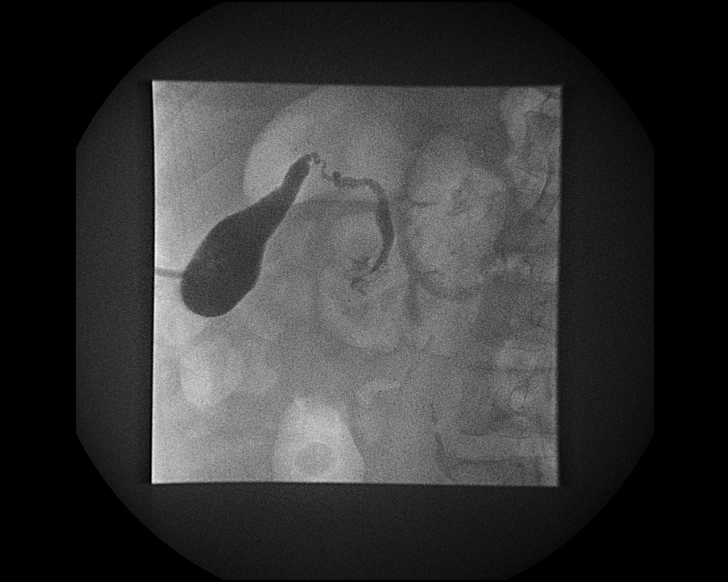
[im 6/20]
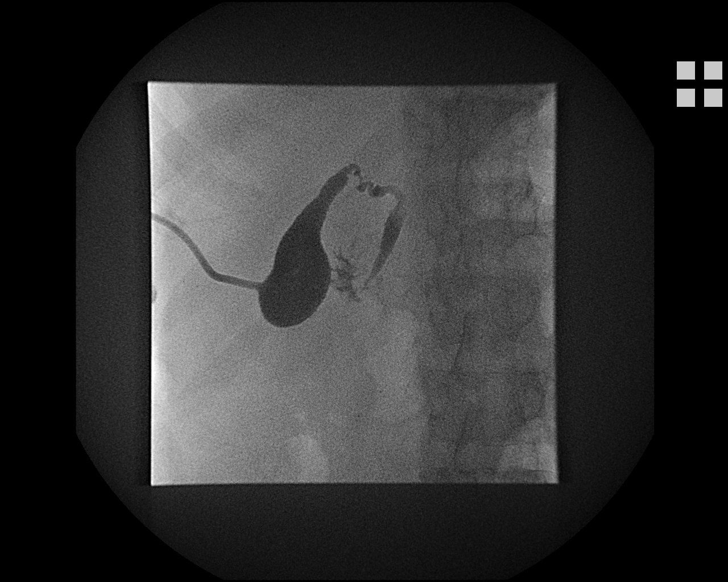
[im 8/20]
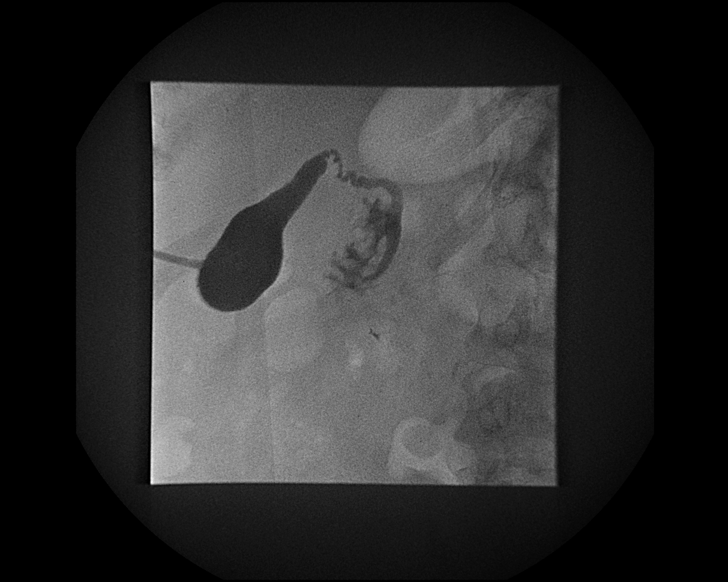
[im 10/20]
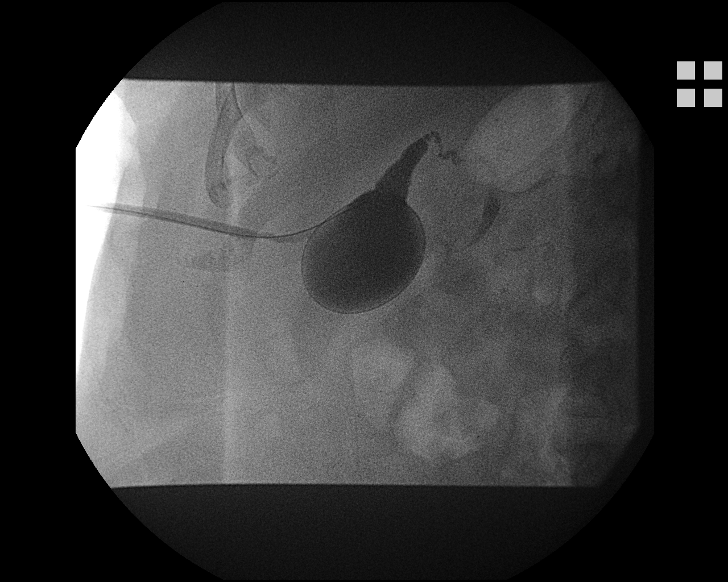
[im 11/20]
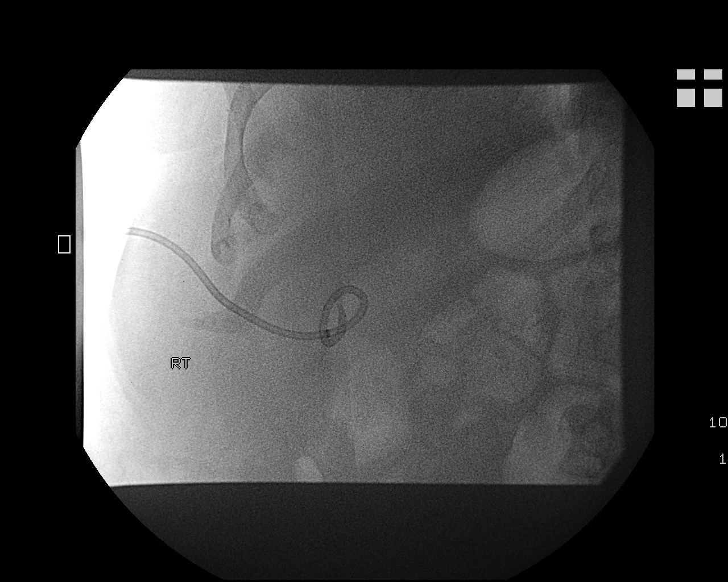
[im 13/20]
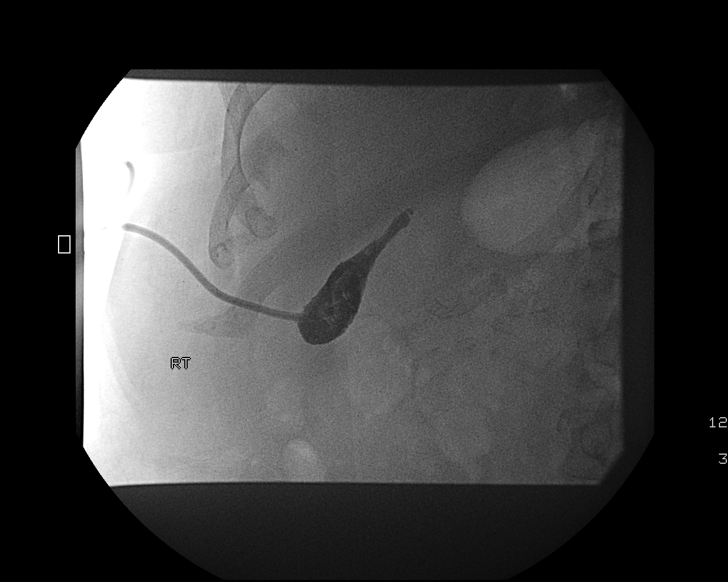
[im 15/20]
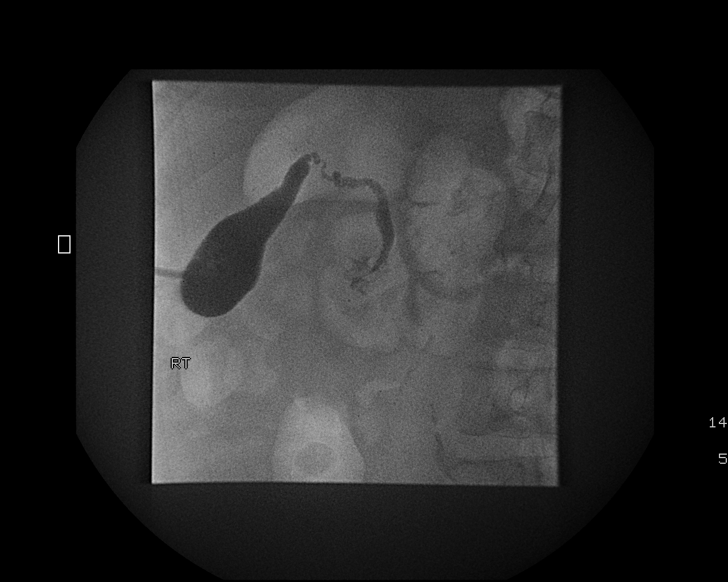
[im 16/20]
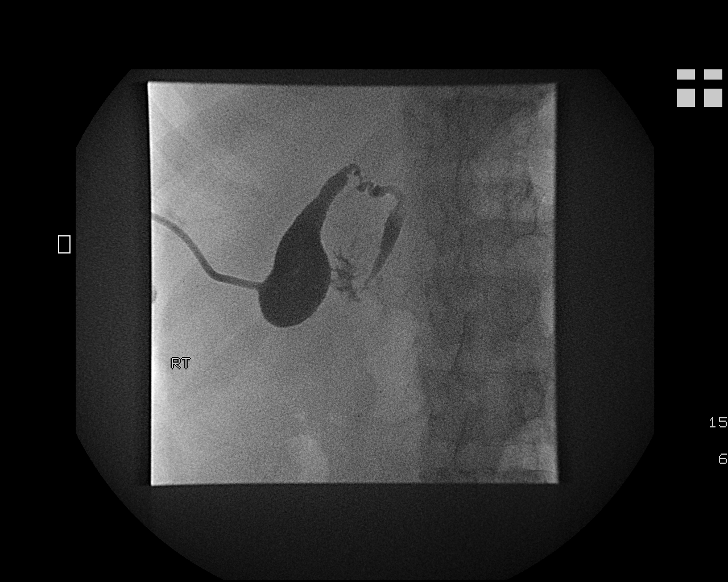
[im 18/20]
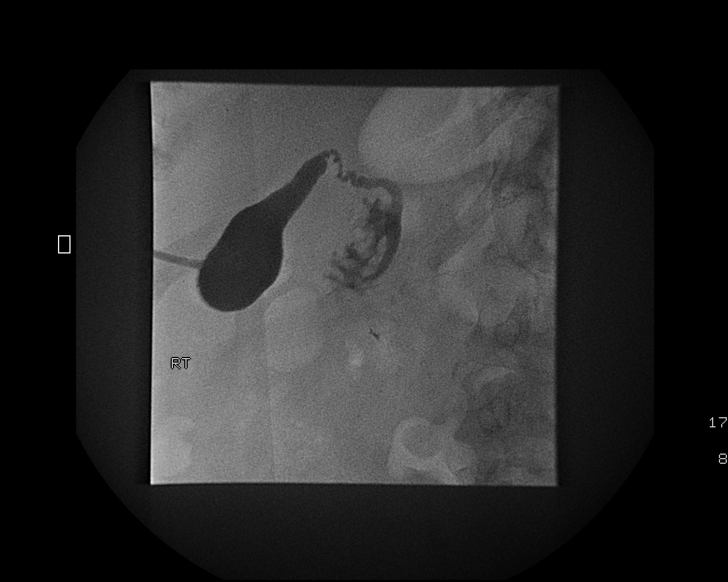
[im 20/20]
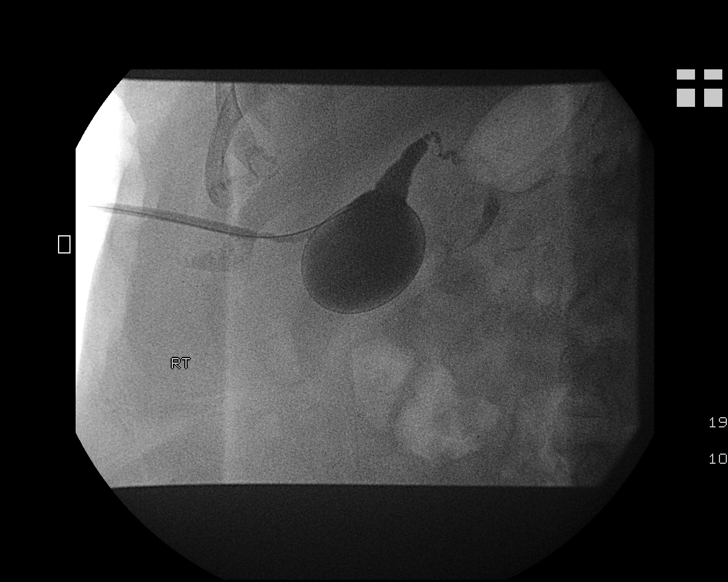

[12 of 20 positions shown; findings below may reference images not displayed]

The patient presents for evaluation for drain removal.

Clinically, he has been afebrile without abdominal pain.

EXAM:
Fluoroscopic guided drain injection, drain check, cholangiogram.

Bedside drain removal.

MEDICATIONS:
None

ANESTHESIA/SEDATION:
None

CONTRAST:  None

COMPLICATIONS:
None immediate.

PROCEDURE:
Informed consent was obtained from the patient following an
explanation of the procedure, risks, benefits and alternatives. The
patient understands, agrees and consents for the procedure. All
questions were addressed. A time out was performed prior to the
initiation of the procedure. The patient was positioned supine on
the fluoroscopy table. The operative site was prepped and draped in
the usual sterile fashion.

Small amount contrast was injected through the tube, confirming
position within the gallbladder. There was antegrade flow of
contrast through the cystic duct and common bile duct, with contrast
observed to enter the duodenum.

The tube was then ligated, and a hydrophilic Glidewire was used to
cannulate the catheter. The catheter was removed over the Glidewire
and a 10 French sheath was advanced into the gallbladder lumen. The
stylet was removed and and over the wire, through the tube
cholangiogram was performed with pullback of the sheath to the liver
parenchyma. This confirmed no extraluminal contrast spillage. A
mature tract was observed into the gallbladder lumen. Again,
contrast was observed to flow through the cystic duct into the
duodenum.

The wire and catheter were removed, after the details were discussed
with the patient's surgeon, Dr. Tottio, who agreed with removing
the tube.

Patient tolerated the procedure well and remained hemodynamically
stable throughout.

No complications were encountered and no significant blood loss was
encountered.
IMPRESSION: Through the tube cholangiogram demonstrates no filling defect of the
gallbladder, and antegrade flow through a patent cystic duct and
common bile duct into the duodenum.

Bedside removal of percutaneous cholecystostomy tube.

## 2018-01-23 ENCOUNTER — Encounter: Payer: Self-pay | Admitting: Internal Medicine
